# Patient Record
Sex: Female | Born: 1940
Health system: Southern US, Community
[De-identification: ages and names within clinical notes are randomized; demographics above are authoritative.]

## PROBLEM LIST (undated history)

## (undated) DIAGNOSIS — R0600 Dyspnea, unspecified: Secondary | ICD-10-CM

## (undated) DIAGNOSIS — Z5189 Encounter for other specified aftercare: Secondary | ICD-10-CM

## (undated) DIAGNOSIS — R519 Headache, unspecified: Secondary | ICD-10-CM

## (undated) DIAGNOSIS — I8290 Acute embolism and thrombosis of unspecified vein: Secondary | ICD-10-CM

## (undated) DIAGNOSIS — J449 Chronic obstructive pulmonary disease, unspecified: Secondary | ICD-10-CM

## (undated) DIAGNOSIS — I219 Acute myocardial infarction, unspecified: Secondary | ICD-10-CM

## (undated) DIAGNOSIS — M549 Dorsalgia, unspecified: Secondary | ICD-10-CM

## (undated) DIAGNOSIS — Z9889 Other specified postprocedural states: Secondary | ICD-10-CM

## (undated) DIAGNOSIS — I10 Essential (primary) hypertension: Secondary | ICD-10-CM

## (undated) DIAGNOSIS — T7840XA Allergy, unspecified, initial encounter: Secondary | ICD-10-CM

## (undated) DIAGNOSIS — I2510 Atherosclerotic heart disease of native coronary artery without angina pectoris: Secondary | ICD-10-CM

## (undated) DIAGNOSIS — M1990 Unspecified osteoarthritis, unspecified site: Secondary | ICD-10-CM

## (undated) DIAGNOSIS — Z9289 Personal history of other medical treatment: Secondary | ICD-10-CM

## (undated) DIAGNOSIS — M545 Other chronic pain: Secondary | ICD-10-CM

## (undated) DIAGNOSIS — R112 Nausea with vomiting, unspecified: Secondary | ICD-10-CM

## (undated) DIAGNOSIS — Z951 Presence of aortocoronary bypass graft: Secondary | ICD-10-CM

## (undated) DIAGNOSIS — M359 Systemic involvement of connective tissue, unspecified: Secondary | ICD-10-CM

## (undated) DIAGNOSIS — J189 Pneumonia, unspecified organism: Secondary | ICD-10-CM

## (undated) DIAGNOSIS — R7611 Nonspecific reaction to tuberculin skin test without active tuberculosis: Secondary | ICD-10-CM

## (undated) DIAGNOSIS — M797 Fibromyalgia: Secondary | ICD-10-CM

## (undated) DIAGNOSIS — D649 Anemia, unspecified: Secondary | ICD-10-CM

## (undated) DIAGNOSIS — G8929 Other chronic pain: Secondary | ICD-10-CM

## (undated) DIAGNOSIS — N189 Chronic kidney disease, unspecified: Secondary | ICD-10-CM

## (undated) DIAGNOSIS — R51 Headache: Secondary | ICD-10-CM

## (undated) DIAGNOSIS — R9431 Abnormal electrocardiogram [ECG] [EKG]: Secondary | ICD-10-CM

## (undated) DIAGNOSIS — G709 Myoneural disorder, unspecified: Secondary | ICD-10-CM

## (undated) DIAGNOSIS — R0989 Other specified symptoms and signs involving the circulatory and respiratory systems: Secondary | ICD-10-CM

## (undated) DIAGNOSIS — E785 Hyperlipidemia, unspecified: Secondary | ICD-10-CM

## (undated) DIAGNOSIS — T8859XA Other complications of anesthesia, initial encounter: Secondary | ICD-10-CM

## (undated) DIAGNOSIS — I200 Unstable angina: Secondary | ICD-10-CM

## (undated) DIAGNOSIS — K219 Gastro-esophageal reflux disease without esophagitis: Secondary | ICD-10-CM

## (undated) DIAGNOSIS — C801 Malignant (primary) neoplasm, unspecified: Secondary | ICD-10-CM

## (undated) DIAGNOSIS — T4145XA Adverse effect of unspecified anesthetic, initial encounter: Secondary | ICD-10-CM

## (undated) DIAGNOSIS — Z86010 Personal history of colonic polyps: Secondary | ICD-10-CM

## (undated) HISTORY — PX: CERVICAL FUSION: SHX112

## (undated) HISTORY — DX: Acute myocardial infarction, unspecified: I21.9

## (undated) HISTORY — DX: Allergy, unspecified, initial encounter: T78.40XA

## (undated) HISTORY — DX: Other chronic pain: G89.29

## (undated) HISTORY — DX: Other specified symptoms and signs involving the circulatory and respiratory systems: R09.89

## (undated) HISTORY — DX: Other chronic pain: M54.50

## (undated) HISTORY — DX: Personal history of colonic polyps: Z86.010

## (undated) HISTORY — PX: TUBAL LIGATION: SHX77

## (undated) HISTORY — DX: Unspecified osteoarthritis, unspecified site: M19.90

## (undated) HISTORY — DX: Essential (primary) hypertension: I10

## (undated) HISTORY — DX: Personal history of other medical treatment: Z92.89

## (undated) HISTORY — DX: Atherosclerotic heart disease of native coronary artery without angina pectoris: I25.10

## (undated) HISTORY — DX: Presence of aortocoronary bypass graft: Z95.1

## (undated) HISTORY — DX: Systemic involvement of connective tissue, unspecified: M35.9

## (undated) HISTORY — DX: Encounter for other specified aftercare: Z51.89

## (undated) HISTORY — DX: Nonspecific reaction to tuberculin skin test without active tuberculosis: R76.11

## (undated) HISTORY — PX: CARPAL TUNNEL RELEASE: SHX101

## (undated) HISTORY — DX: Fibromyalgia: M79.7

## (undated) HISTORY — PX: TROCHANTERIC BURSA EXCISION: SHX2581

## (undated) HISTORY — DX: Hyperlipidemia, unspecified: E78.5

## (undated) HISTORY — DX: Low back pain: M54.5

---
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
## 1979-01-03 HISTORY — PX: ABDOMINAL HYSTERECTOMY: SHX81

## 1983-01-03 HISTORY — PX: EYE SURGERY: SHX253

## 1983-01-03 HISTORY — PX: BLADDER REPAIR: SHX76

## 1990-01-02 HISTORY — PX: BREAST SURGERY: SHX581

## 1997-08-11 ENCOUNTER — Other Ambulatory Visit: Admission: RE | Admit: 1997-08-11 | Discharge: 1997-08-11 | Payer: Self-pay | Admitting: Obstetrics and Gynecology

## 1997-11-19 ENCOUNTER — Ambulatory Visit (HOSPITAL_COMMUNITY): Admission: RE | Admit: 1997-11-19 | Discharge: 1997-11-19 | Payer: Self-pay | Admitting: *Deleted

## 1998-09-23 ENCOUNTER — Other Ambulatory Visit: Admission: RE | Admit: 1998-09-23 | Discharge: 1998-09-23 | Payer: Self-pay | Admitting: *Deleted

## 1998-11-11 ENCOUNTER — Encounter: Payer: Self-pay | Admitting: Neurosurgery

## 1998-11-11 ENCOUNTER — Ambulatory Visit (HOSPITAL_COMMUNITY): Admission: RE | Admit: 1998-11-11 | Discharge: 1998-11-11 | Payer: Self-pay | Admitting: Neurosurgery

## 1998-12-06 ENCOUNTER — Ambulatory Visit (HOSPITAL_COMMUNITY): Admission: RE | Admit: 1998-12-06 | Discharge: 1998-12-06 | Payer: Self-pay | Admitting: Neurosurgery

## 1998-12-06 ENCOUNTER — Encounter: Payer: Self-pay | Admitting: Neurosurgery

## 1999-04-11 ENCOUNTER — Encounter: Payer: Self-pay | Admitting: Family Medicine

## 1999-04-11 ENCOUNTER — Encounter: Admission: RE | Admit: 1999-04-11 | Discharge: 1999-04-11 | Payer: Self-pay | Admitting: Family Medicine

## 1999-10-31 ENCOUNTER — Other Ambulatory Visit: Admission: RE | Admit: 1999-10-31 | Discharge: 1999-10-31 | Payer: Self-pay | Admitting: *Deleted

## 2000-01-03 HISTORY — PX: PUBOVAGINAL SLING: SHX1035

## 2000-01-30 ENCOUNTER — Ambulatory Visit (HOSPITAL_COMMUNITY): Admission: RE | Admit: 2000-01-30 | Discharge: 2000-01-30 | Payer: Self-pay | Admitting: *Deleted

## 2000-12-20 ENCOUNTER — Other Ambulatory Visit: Admission: RE | Admit: 2000-12-20 | Discharge: 2000-12-20 | Payer: Self-pay | Admitting: Obstetrics and Gynecology

## 2001-05-23 ENCOUNTER — Encounter: Admission: RE | Admit: 2001-05-23 | Discharge: 2001-05-23 | Payer: Self-pay | Admitting: Family Medicine

## 2001-05-23 ENCOUNTER — Encounter: Payer: Self-pay | Admitting: Family Medicine

## 2001-07-25 ENCOUNTER — Encounter: Payer: Self-pay | Admitting: Neurosurgery

## 2001-07-25 ENCOUNTER — Ambulatory Visit (HOSPITAL_COMMUNITY): Admission: RE | Admit: 2001-07-25 | Discharge: 2001-07-25 | Payer: Self-pay | Admitting: Neurosurgery

## 2002-01-02 DIAGNOSIS — I8290 Acute embolism and thrombosis of unspecified vein: Secondary | ICD-10-CM

## 2002-01-02 DIAGNOSIS — Z5189 Encounter for other specified aftercare: Secondary | ICD-10-CM

## 2002-01-02 HISTORY — DX: Acute embolism and thrombosis of unspecified vein: I82.90

## 2002-01-02 HISTORY — DX: Encounter for other specified aftercare: Z51.89

## 2002-08-03 ENCOUNTER — Emergency Department (HOSPITAL_COMMUNITY): Admission: EM | Admit: 2002-08-03 | Discharge: 2002-08-03 | Payer: Self-pay | Admitting: Emergency Medicine

## 2002-08-05 ENCOUNTER — Emergency Department (HOSPITAL_COMMUNITY): Admission: AD | Admit: 2002-08-05 | Discharge: 2002-08-05 | Payer: Self-pay | Admitting: *Deleted

## 2002-08-10 ENCOUNTER — Encounter: Payer: Self-pay | Admitting: *Deleted

## 2002-08-10 ENCOUNTER — Inpatient Hospital Stay (HOSPITAL_COMMUNITY): Admission: EM | Admit: 2002-08-10 | Discharge: 2002-08-20 | Payer: Self-pay | Admitting: *Deleted

## 2002-08-11 ENCOUNTER — Encounter: Payer: Self-pay | Admitting: *Deleted

## 2002-08-12 ENCOUNTER — Encounter: Payer: Self-pay | Admitting: *Deleted

## 2002-08-13 ENCOUNTER — Encounter: Payer: Self-pay | Admitting: *Deleted

## 2002-08-14 ENCOUNTER — Encounter: Payer: Self-pay | Admitting: *Deleted

## 2002-08-15 ENCOUNTER — Encounter: Payer: Self-pay | Admitting: Cardiology

## 2002-08-15 ENCOUNTER — Encounter: Payer: Self-pay | Admitting: *Deleted

## 2002-08-16 ENCOUNTER — Encounter: Payer: Self-pay | Admitting: *Deleted

## 2002-08-19 ENCOUNTER — Encounter: Payer: Self-pay | Admitting: *Deleted

## 2002-08-20 ENCOUNTER — Inpatient Hospital Stay (HOSPITAL_COMMUNITY)
Admission: RE | Admit: 2002-08-20 | Discharge: 2002-09-02 | Payer: Self-pay | Admitting: Physical Medicine & Rehabilitation

## 2002-08-21 ENCOUNTER — Encounter: Payer: Self-pay | Admitting: Physical Medicine & Rehabilitation

## 2003-04-20 ENCOUNTER — Ambulatory Visit (HOSPITAL_COMMUNITY): Admission: RE | Admit: 2003-04-20 | Discharge: 2003-04-20 | Payer: Self-pay | Admitting: *Deleted

## 2003-04-20 LAB — HM COLONOSCOPY: HM Colonoscopy: NORMAL

## 2004-09-16 ENCOUNTER — Encounter: Admission: RE | Admit: 2004-09-16 | Discharge: 2004-09-16 | Payer: Self-pay | Admitting: Family Medicine

## 2004-10-04 ENCOUNTER — Encounter: Admission: RE | Admit: 2004-10-04 | Discharge: 2004-10-17 | Payer: Self-pay | Admitting: Internal Medicine

## 2005-01-23 ENCOUNTER — Ambulatory Visit (HOSPITAL_COMMUNITY): Admission: RE | Admit: 2005-01-23 | Discharge: 2005-01-23 | Payer: Self-pay | Admitting: Anesthesiology

## 2005-07-27 ENCOUNTER — Ambulatory Visit: Payer: Self-pay | Admitting: Family Medicine

## 2005-11-17 ENCOUNTER — Encounter: Admission: RE | Admit: 2005-11-17 | Discharge: 2005-11-17 | Payer: Self-pay | Admitting: Family Medicine

## 2006-03-15 ENCOUNTER — Ambulatory Visit: Payer: Self-pay | Admitting: Family Medicine

## 2006-11-25 ENCOUNTER — Emergency Department (HOSPITAL_COMMUNITY): Admission: EM | Admit: 2006-11-25 | Discharge: 2006-11-25 | Payer: Self-pay | Admitting: Emergency Medicine

## 2007-06-07 ENCOUNTER — Emergency Department (HOSPITAL_COMMUNITY): Admission: EM | Admit: 2007-06-07 | Discharge: 2007-06-07 | Payer: Self-pay | Admitting: Emergency Medicine

## 2007-10-16 ENCOUNTER — Ambulatory Visit (HOSPITAL_COMMUNITY): Admission: RE | Admit: 2007-10-16 | Discharge: 2007-10-16 | Payer: Self-pay | Admitting: Anesthesiology

## 2008-02-11 ENCOUNTER — Ambulatory Visit: Payer: Self-pay | Admitting: Family Medicine

## 2008-02-25 ENCOUNTER — Ambulatory Visit: Payer: Self-pay | Admitting: Family Medicine

## 2008-02-25 ENCOUNTER — Encounter: Admission: RE | Admit: 2008-02-25 | Discharge: 2008-02-25 | Payer: Self-pay | Admitting: Family Medicine

## 2008-03-11 ENCOUNTER — Ambulatory Visit: Payer: Self-pay | Admitting: Family Medicine

## 2009-07-19 ENCOUNTER — Encounter: Admission: RE | Admit: 2009-07-19 | Discharge: 2009-07-19 | Payer: Self-pay | Admitting: Family Medicine

## 2009-07-19 ENCOUNTER — Ambulatory Visit: Payer: Self-pay | Admitting: Family Medicine

## 2009-09-07 ENCOUNTER — Ambulatory Visit: Payer: Self-pay | Admitting: Family Medicine

## 2009-09-21 ENCOUNTER — Ambulatory Visit: Payer: Self-pay | Admitting: Family Medicine

## 2009-09-28 ENCOUNTER — Ambulatory Visit: Payer: Self-pay | Admitting: Family Medicine

## 2010-01-31 LAB — HM MAMMOGRAPHY

## 2010-05-20 NOTE — Discharge Summary (Signed)
[name] [name], [name] [name]                            ACCOUNT NO.:  [id number]   MEDICAL RECORD NO.:  [id number]                   PATIENT TYPE:  IPS   LOCATION:  4009                                 FACILITY:  [facility]   PHYSICIAN:  Ellwood Dense, M.D.                DATE OF BIRTH:  [birth year]   DATE OF ADMISSION:  08/20/2002  DATE OF DISCHARGE:  09/02/2002                                 DISCHARGE SUMMARY   DISCHARGE DIAGNOSES:  1. Fibromyalgia with acute onset of left extremity pain with immobility and     deconditioning.  2. Left ankle deep vein thrombosis.  3. Pneumonia.  4. Connective tissue disorder.  5. Urinary retention.  6. Anemia of chronic disease.  7. Depression.   HISTORY OF PRESENT ILLNESS:  [name] [name] is a 70 year old female with history  of chronic neck and back pain, fibromyalgia, recent exacerbation of symptoms  admitted to [facility] hospital August 8 with acute confusion,  hallucinations, urinary retention, acute renal failure, right upper lobe  pneumonia. Nephrology was consulted, and patient treated with sodium bicarb  and IV fluids with improvement in her renal status. She was started on IV  antibiotics for pneumonia. She developed ARSD requiring intubation August 8  to August 11. Dr. Aldean Ast was consulted for bladder outlet obstruction,  and Foley was placed and to remain in place until mobility improves. The  patient did develop complaints of left thigh pain, and Dopplers were done  showing positive for thrombus in left posterior tibial vein above the ankle.  MRI of thoracic back done showed no cord lesions, fracture or mass; a  hemangioma at T9 vertebral body on right. AP of pelvis and left femur done  showed no acute abnormalities, subtle lucency over medullary cavity of  proximal femur with question need for nucleus scintigraphy versus MRA. Dr.  Thad Ranger was consulted as patient with question of thoracic myelopathy. A  followup exam by Dr. Orlin Hilding showed no  signs of myelopathy; however, the  patient noted to be sedated secondary to multiple medications. Some ST  segment changes were noted on her EKG with elevated troponin. [place]  cardiology was consulted for abnormalities secondary to sepsis and acute  renal insufficiency. Followup echocardiogram was done on August 18 showing  EF of 55 to 65%. No valvular disease. The patient to have Cardiolite on  outpatient basis. The patient did have a drop in H & H requiring 2 units of  packed red blood cells once Lovenox was initiated. GI was consulted and felt  the problem was due to NSAIDs and blood thinners. RBC indices showed patient  with anemia of chronic disease. He recommended continuing PPI and following  patient along for signs of active bleed. Physical therapy initiated, and  patient at moderate assist for transfers, minimum to maximum assist to stand  for ADLs.   PAST MEDICAL HISTORY:  1. Significant for  chronic neck and back pain with cervical fusion.  2. Fibromyalgia.  3. Depression.  4. Asthma.  5. Incontinence with bladder surgery x2.  6. Appendectomy.  7. GERD.  8. Chronic diarrhea.  9. Allergic rhinitis.  10.      Hypertension.  11.      Carpal tunnel surgery.   ALLERGIES:  No known drug allergies.   SOCIAL HISTORY:  The patient is married and lives in two level home with  four steps at entry. Was independent but bed bound one week prior to  admission secondary to pain. She does not use any tobacco or alcohol.   HOSPITAL COURSE:  [name] [name] was admitted to rehab on August 20, 2002  for inpatient therapies to consist of PT and OT daily. The past admission,  the patient was maintained on Lovenox for 14 days total to complete  antibiotic therapy. She was noted to have significant pain in her left thigh  with difficulty with examining left thigh secondary to pain. Followup MRI  was done of proximal thigh as well as MRI of lumbar spine to rule out any  abnormality. MRI of  pelvis and femur showed unremarkable bony structures of  pelvis, mild edema like changes,  external rotation of left hip suggesting  muscle strain or myositis, and mild edema like changes in obturator  musculature. No joint effusion, no peri-articular fluid, no evidence of  trochanteric bursitis. MRI of lumbar spine showed small foraminal protrusion  L3-L4, left, without L3-L4 nerve root encroachment, small central perfusion  L5-S1, and lower lumbar facet arthropathy. The patient was maintained on  Coumadin throughout her stay.   Labs done past admission showed H and H of 11.4 and 34.0. Stool guaiac x1  done was negative. Check of electrolytes showed sodium 139, potassium 3.8,  chloride 109, CO2 25, BUN 17, creatinine 1.2, glucose 143. AST 43, ALT 43,  alkaline phosphatase 68, total bilirubin 0.3. The patient was maintained on  fentanyl patch. This was titrated to 75 mcg initially. Baclofen was added on  a t.i.d. basis as well as Xanax and Bextra for pain control. The patient was  also using p.r.n. OxyIR for pain management. The patient achieved good pain  control on multiple medications; however, she was noted to have some  problems with nausea as well as lethargy. The patient's medicines were  tapered to an extent at time of discharge. She was taken off of baclofen  with improvement in her sedation. She was also tapered to 50 mcg of fentanyl  patch with OxyIR 5 to 10 to be used q.i.d. Bextra to increase to 20 past  discharge. The patient's pain is under good control at time of discharge.  She is to followup with Dr. Thyra Breed for any further adjustment of her  chronic pain issues. As pain control improved, she was able to make good  progress. At time of discharge, the patient had progressed to being modified  independent for ADLs, modified independent for toileting, as well as  advanced ADLs at wheelchair level. The patient is independent for transfers. She is modified independent  for ambulating with a rolling walker in a  restricted environment, requires supervision for ambulating on the unit for  greater than 100 feet. Further progressive therapies to include home health  PT/OT by [facility] past discharge. Home health RN has been arranged  to draw pro times, next on September with results to Dr. Susann Givens. On September 02, 2002, the patient is  discharged to home.   DISCHARGE MEDICATIONS:  1. Plaquenil 200 mg two p.o. per day.  2. Zoloft 25 mg a day.  3. Neurontin 600 mg t.i.d.  4. Prevacid 30 mg a day.  5. Bextra 20 mg a day.  6. Cardura 4 mg half to one q.h.s.  7. Fentanyl patch 50 mcg change every three days.  8. Coumadin 2 mg _____ p.o. q.p.m.  9. OxyIR 5 to 10 mg q.4-6h. p.r.n. pain.  10.      Skelaxin 400 mg q.i.d. p.r.n. spasm.  11.      May use Tylenol additionally for pain.   ACTIVITY:  Use walker and wheelchair.   SPECIAL INSTRUCTIONS:  No alcohol, no smoking, no driving. Catheterize three  to four times a day to keep residuals less than 500 cc.   FOLLOWUP:  The patient is to follow up with Dr. Susann Givens and Dr. Thyra Breed in the next two to three weeks. Follow up with Dr. Aldean Ast in two  to three weeks for urology.   ADDENDUM:  Once patient's mobility improved, Foley was discontinued. The  patient was noted to have problems voiding once Foley was discontinued. Dr.  Aldean Ast initiated Cardura and increased this to 4 mg. At time of  discharge, the patient still reports no voiding and requires in and out  catheter. She is to follow up with Dr. Aldean Ast on an outpatient basis for  further management of urinary retention.      Greg Cutter, P.A.                    Ellwood Dense, M.D.    PP/MEDQ  D:  09/02/2002  T:  09/03/2002  Job:  [id number]   cc:   Olga Millers, M.D.   Michael L. Thad Ranger, M.D.  [address]  [place]  [place] [address]  Fax: [phone]   Rozanna Boer., M.D.  [address]  Fl[address]  [place]  [place] [address]  Fax: [phone]   Petra Kuba, M.D.  [address]  [place]  [place] [address]  Fax: [phone]   Shan Levans, M.D. [facility]   Aram Beecham B. Eliott Nine, M.D.  [address]  [place]  [place] [address]  Fax: [phone]   Willette Pa, M.D.  [facility], Director of rheumatology, allergy,  [facility] [address]  [place], [place] [address]   Kathrin Penner. Vear Clock, M.D.  [address]  [place]  [place] [address]  Fax: [phone]   Sharlot Gowda, M.D.  [address] [place], [place] [address]  Fax: [phone]

## 2010-05-20 NOTE — Consult Note (Signed)
[name] [name], [name] [name]                            ACCOUNT NO.:  [id number]   MEDICAL RECORD NO.:  [id number]                   PATIENT TYPE:  INP   LOCATION:  6741                                 FACILITY:  [facility]   PHYSICIAN:  Petra Kuba, M.D.                 DATE OF BIRTH:  1940-09-09   DATE OF CONSULTATION:  08/18/2002  DATE OF DISCHARGE:                                   CONSULTATION   HISTORY:  The patient with a complicated hospital course who has previously  been seen by Dr. Luther Parody with nondiagnostic colon and endo in January of  2002 who prior to getting sick at home has had minimal GI symptoms, took  Prevacid every other day without problems. She has not seen any blood in her  bowels and had no abdominal complaints. She had lost some weight but has  been on a diet trying to loose it herself. During stay in the hospital, she  has had some vague abdominal pain. She has not seen any obvious blood, has  been moving her bowels normally. She believes the pain are due to her  injections of Lovenox. She has had some decreased appetite and some mild  nausea. We were consulted for further workup and plans.   PAST MEDICAL HISTORY:  Resolved ARDS and renal failure as well as  thrombocytopenia. She has had fibromyalgia, chronic neck and back pain, as  well as mild asthma, history of depression, history of bladder surgery,  tubal ligation, appendectomy, cervical fusion.   MEDICATIONS ON ADMISSION:  Bextra, prednisone, Duragesic, Percocet,  albuterol, and Phenergan. Currently in the hospital now, she is on Protonix,  prednisone, Plaquenil, Neurontin, Zoloft, Avelox, Ativan, Klonopin,  Ventolin, Coumadin, Lovenox, baclofen, Flexeril.   SOCIAL HISTORY:  Quit tobacco 25 years ago. No alcohol. Did use some  nonsteroidals at home.   ALLERGIES:  No known drug allergies.   FAMILY HISTORY:  Pertinent for the chart says she is adopted.   REVIEW OF SYSTEMS:  Negative except for as  above.   PHYSICAL EXAMINATION:  GENERAL:  The patient looks much better than her  situation sounds.  VITAL SIGNS:  Stable, afebrile.  ABDOMEN:  Her exam pertinent for abdomen being slightly sore but good bowel  sounds, soft. She was guaiac positive in the computer but greenish stool.   Labs pertinent for a slight decrease in her hemoglobin to 8.9; had been 9  for a few days; MCV 86. BUN and creatinine now normal 9 and 1.0; had been  initially 135 and a creatinine of 9.0. Liver tests were normal, albumin 2.3.  Initially, her first two guaiacs were negative in the hospital, the last two  had been positive.   ASSESSMENT:  1. Multiple medical problems, probably slowly improving.  2. Guaiac positive on blood thinners.  3. Low platelet count, resolved.  4. Acute renal failure, resolved, and  at home nonsteroidals and COX's. She     was guaiac negative x2 initially on admission.  5. Anemia with the indices that looked more like chronic disease, and she     did have her decrease in her hemoglobin with increase in IV fluids and     resolving renal failure.   PLAN:  Will allow patient to improve. Will proceed with endoscopic  procedures p.r.n. signs of active bleeding or if signs of slow oozing  continue. Agree with pump inhibitors for now. Dr. Luther Parody who knows the  patient and has seen in the past will see her either Tuesday or Wednesday  and call sooner if any questions, problems, or signs of active bleeding.                                               Petra Kuba, M.D.    MEM/MEDQ  D:  08/18/2002  T:  08/19/2002  Job:  [id number]   cc:   Althea Grimmer. Luther Parody, M.D.  [address]  [place]  [place] [address]  Fax: [phone]

## 2010-05-20 NOTE — Consult Note (Signed)
[name], [name]                            ACCOUNT NO.:  [id number]   MEDICAL RECORD NO.:  [id number]                   PATIENT TYPE:  INP   LOCATION:  2114                                 FACILITY:  [facility]   PHYSICIAN:  Maree Krabbe, M.D.             DATE OF BIRTH:  05-20-1940   DATE OF CONSULTATION:  08/10/2002  DATE OF DISCHARGE:                                   CONSULTATION   REASON FOR CONSULTATION:  Elevated creatinine.   HISTORY OF PRESENT ILLNESS:  The patient is a 70 year old white female with  a history of chronic neck and back pain, mild asthma, fibromyalgia, and a  history of depression.  She also has a history of urinary incontinence with  two bladder surgeries in the past.  She had an episode of acute back pain  about one week ago.  Her Bextra dose was increased to 60 mg a day and she  took more pain medications.  Subsequently she developed decreased urine  output about four days ago and now presents with acute renal failure, severe  azotemia, right upper lobe pneumonia, and 1500 mL in her bladder on initial  catheterization.  She was also recently placed on a steroid taper for her  back pain, as well as Percocet and Phenergan.  She was also on a Duragesic  patch.  She was seen at [facility] here on Tuesday and given pain treatment.  Because of decreased urine output, she had an I&O catheterization with 1400  mL of urine in her bladder.  She developed confusion last night at 9 p.m.  She was brought to the emergency room today by her husband, who provides  most of the history.   PAST MEDICAL HISTORY:  1. Asthma.  2. Chronic neck and back pain with cervical symptoms.  3. Fibromyalgia.   PAST SURGICAL HISTORY:  1. Tubal ligation.  2. Bladder suspension; she had a second surgery for incontinence to put a     kink in it.  I am not sure what this means.  3. Appendectomy.  4. Cervical fusion.   MEDICATIONS:  1. Bextra 60 mg daily.  2. Prednisone taper.  3. Amibid.  4. Duragesic patch.  5. Percocet.  6. Albuterol.  7. Promethazine p.r.n.   SOCIAL HISTORY:  Quit tobacco 25 years ago.  Lives with her husband.  No  alcohol.  Retired from [employer].   FAMILY HISTORY:  She is adopted.   REVIEW OF SYSTEMS:  GENERAL:  Some fevers.  No chills or night sweats.  EARS, NOSE, AND THROAT:  No hearing loss, visual changes, sore throat, or  difficulty swallowing.  CARDIORESPIRATORY:  Denies chest pain.  She does  have a productive cough.  No significant shortness of breath.  GASTROINTESTINAL:  Positive for nausea and vomiting.  No diarrhea or  abdominal pain.  GENITOURINARY:  Decreased urine output for four days.  MUSCULOSKELETAL:  No joint pain or swelling.  She has chronic pain in her  left leg and back.  NEUROLOGIC:  No history of stroke, TIA, or seizure.  No  focal numbness or weakness.   PHYSICAL EXAMINATION:  VITAL SIGNS:  Blood pressure 112/40, temperature  100.2 degrees, heart rate 110, respirations 28, pulse oximetry 90%.  GENERAL APPEARANCE:  This is an awake, confused, delirious, and mildly  agitated white female.  She is fidgety.  She answers most questions  appropriately.  She is fully oriented.  SKIN:  Without rash.  She is flushed in the face.  HEENT:  PERRL.  EOMI.  The throat is dry.  NECK:  Supple with flat neck veins.  CHEST:  Clear on the left side.  The right side has coarse upper airway  noises.  No rales.  Possibly mild expiratory wheezing.  CARDIAC:  Regular rate and rhythm without lift.  Soft systolic ejection  murmur.  No rub or gallop.  ABDOMEN:  Soft and nontender.  No ascites.  Normal bowel sounds.  EXTREMITIES:  No peripheral edema.  No calf tenderness.  No cyanosis.  NEUROLOGIC: Oriented x 3.   LABORATORY DATA:  Sodium 126, potassium 5, chloride 83, CO2 14, BUN 135,  creatinine 9.0, glucose 83, AST 33, ALT 20.  The pH was 7.2, pCO2 34, pO2  83, and 93% oxygen saturation.  Chest x-ray with dense right upper lobe   pneumonia.   IMPRESSION:  1. Acute renal failure due to functional and possibly mechanical bladder     neck obstruction.  Possible contributing factors include volume     depletion, COX 2 inhibitor effect, and anticholinergic effects of     medications.  2. Severe metabolic acidosis due to acute renal failure and poor respiratory     response.  3. History of asthma.  4. Dense acute right upper lobe pneumonia.  5. Chronic back pain.  6. History of bladder surgeries for incontinence.  7. History of depression.   RECOMMENDATIONS:  1. Foley catheter as you have done.  2. Bicarbonate drip at 150 mL/hr and then decrease rate when the pH is over     7.25.  3. Give 2 amps of sodium bicarbonate now and repeat blood gas.  4. Expect renal function to recover with fluids and Foley catheter in time.  5. Consider urology evaluation.  6. Avoid medications with anticholinergic side effects.  7. No need for dialysis at this time.                                               Maree Krabbe, M.D.    RDS/MEDQ  D:  08/10/2002  T:  08/10/2002  Job:  [id number]

## 2010-05-20 NOTE — Consult Note (Signed)
[name] [name], [name] [name]                            ACCOUNT NO.:  [id number]   MEDICAL RECORD NO.:  [id number]                   PATIENT TYPE:  INP   LOCATION:  6741                                 FACILITY:  [facility]   PHYSICIAN:  Learta Codding, M.D.                 DATE OF BIRTH:  10-29-1940   DATE OF CONSULTATION:  08/14/2002  DATE OF DISCHARGE:                                   CONSULTATION   REPORT TITLE:  CARDIOLOGY CONSULTATION   CONSULTING PHYSICIAN:  Learta Codding, M.D.   REFERRING PHYSICIAN:  Ronnald Nian, M.D.   REASON FOR CONSULTATION:  Mildly elevated cardiac Troponin levels.   HISTORY OF PRESENT ILLNESS:  [name] [name] is a 70 year old female with chronic  neck and lower back pain, fibromyalgia, and history of depression.  The  patient was admitted on August 10, 2002, with bladder outlet obstruction as  well as sepsis and ultimately developing acute lung injury and sepsis  syndrome requiring intubation.  The patient remained critically ill for  several days and was placed on Xigris infusion.  She has now been extubated.  She denies shortness of breath at rest.  She does report severe left leg  pain.  She has severe left leg pain which is very tender to the touch.   A duplex study demonstrated a small thrombus in the posterior tibial vein  just above the ankle.  Otherwise, there was no evidence of deep vein  thrombosis.  Last night, a cardiac Troponin was drawn for unclear reasons as  the patient did not report any substernal chest pain.  The level was mildly  elevated at 0.66.  Next, the Troponin levels of last night was 0.64.  The  patient denied substernal chest pain.  She denies prior history of  myocardial infarction.  She is adopted and does not know much about her  family history.  Prior to this admission, however, she reports no dyspnea on  exertion or chest pain on exertion.   ALLERGIES:  No known drug allergies.   MEDICATIONS:  1. Duragesic patch.  2. Protonix  40 mg daily.  3. Prednisone 10 mg daily.  4. Plaquenil 4 mg daily.  5. Neurontin 600 mg p.o. at bedtime.  6. Zoloft 25 mg daily.  7. Lovenox 80 mg subcu q.12 h.   PAST MEDICAL HISTORY:  1. History of back pain and neck pain, treated with Bextra, Prednisone,     Percocet, Phenergan, Duragesic.  2. History of fibromyalgia.  3. History of asthma.  4. History of multiple surgeries.  5. History of depression.  6. History of recent bladder outlet obstruction.  7. History of sepsis and multiorgan dysfunction syndrome requiring Xigris     therapy.  8. History of cervical fusion.  9. History of tubal ligation.  10.      History of bladder suspension.   SOCIAL HISTORY:  The patient is retired from [employer].  Lives with her husband  in [place].  No alcohol use.  Quit tobacco 25 years ago.   FAMILY HISTORY:  The patient is adopted.   REVIEW OF SYSTEMS:  As per HPI.  No current fever or chills.  No headache or  sore throat.  No chest pain or shortness of breath.  Currently no frequency  or dysuria.  Positive for weakness, myalgias and nephralgias.  No nausea or  vomiting.  No polyuria or polydipsia.   PHYSICAL EXAMINATION:  VITAL SIGNS:  Blood pressure 144/80, heart rate 70,  temperature afebrile.  GENERAL:  Well-nourished white female somewhat pale appearing.  HEENT:  Pupils equal, round and reactive to light.  NECK:  Supple.  No carotid bruits.  LUNGS:  Bilateral crackles throughout.  No rhonchi, no wheezing.  HEART:  Regular rate and rhythm.  Soft murmur in the left upper sternal  border, but no S3 or S4.  SKIN:  Warm and dry.  Tender to touch in the left thigh.  ABDOMEN:  Soft, nontender, no rebound or guarding.  Good bowel sounds.  GENITALIA/RECTAL:  Deferred.  EXTREMITIES:  No cyanosis, clubbing or edema.  NEUROLOGICAL:  The patient is alert, oriented and grossly nonfocal.   LABORATORY DATA:  Chest x-ray on August 12, 2002, right upper lobe  pneumonia.  Bilateral lower lobe  opacities consistent with pneumonia.   EKG:  Normal sinus rhythm.  Heart 73 bpm.  Nonspecific ST-T wave changes.  No prior infarction pattern.   Serum creatinine was 9.09, now 1.8, BUN 51, potassium 8.2, sodium 147,  glucose 143.  Hemoglobin 9.0, hematocrit 26, platelets 124,000.  Troponin  0.64 and 0.66.  D-dimer 5.05.   IMPRESSION/PLAN:  1. Elevated cardiac Troponins.  Elevation of cardiac Troponins is entirely     nonspecific.  The patient is just recovering from sepsis and multiorgan     dysfunction syndrome, a condition which is known to elevate the cardiac     Troponins.  Elevated cardiac Troponins are a mortality marker for this     syndrome.  However, I do not suspect that the cardiac enzymes are     elevated due to underlying ischemic heart disease.  An alternative     hypothesis could be that she briefly developed a septic cardiomyopathy     with elevation and cardiac Troponin markers.  The patient has no prior     history of myocardial infarction and no current chest pain that would     stop drawing cardiac Troponins at this point in time.  The patient should     get an echocardiogram for evaluation of left ventricular function and an     adenosine/Cardiolite in the near future for further risk gratification.     Would anticipate that both studies would be within normal limits.  No     further workup is required beyond that point.  I have no specific     recommendation for cardiac medications at this point in time, but this     recommendation can be changed pending the results of her echocardiogram     and adenosine/Cardiolite study.  Another possibility explaining the     elevated cardiac Troponin could be right heart strain/stress from acute     lung injury.  2. Status post acute renal failure secondary to bladder outlet obstruction     and sepsis. 3. Status post sepsis metabolic acidosis requiring Xigris therapy.  4. Left lower extremity small  deep vein thrombosis which  would explain the     elevated D-dimer, but it is also again nonspecific.  Doubt the patient     has had pulmonary embolism which is again an alternative explanation for     elevation of cardiac Troponin.  5. Chronic pain with narcotic dependency.  6. History of pneumonia/acute respiratory distress syndrome.  7. Fibromyalgia and back pain.  8. Anticoagulation.  Agree with Lovenox at this point in time, but would     carefully monitor anti-DNA levels as the patient's creatinine is still     abnormal.  I suspect her dose is too high for Lovenox at this point in     time.   DISPOSITION:  No further recommendations short of obtaining an  echocardiogram and adenosine/Cardiolite to make sure the patient does not  have any significant underlying heart disease.  As outline above, cardiac  Troponin elevations are nonspecific and could be entirely explained by a  recent hospital course.                                               Learta Codding, M.D.    GED/MEDQ  D:  08/14/2002  T:  08/14/2002  Job:  [id number]   cc:   Sharlot Gowda, M.D.  [address] [place], [place] [address]  Fax: [phone]

## 2010-05-20 NOTE — Consult Note (Signed)
[name] [name], [name] [name]                            ACCOUNT NO.:  [id number]   MEDICAL RECORD NO.:  [id number]                   PATIENT TYPE:  INP   LOCATION:  6741                                 FACILITY:  [facility]   PHYSICIAN:  Melvyn Novas, M.D.               DATE OF BIRTH:  September 15, 1940   DATE OF CONSULTATION:  08/15/2002  DATE OF DISCHARGE:                                   CONSULTATION   NEUROLOGY CONSULTATION:   PRIMARY CARE PHYSICIAN:  Alfonse Spruce, M.D.   REASON FOR CONSULTATION:  The patient is in severe pain and I am consulted  to evaluate her for possible disk disease.  This is a 70 year old Caucasian  right-handed female who was admitted on August 10, 2002 developing acute  renal failure with bladder dysfunction.  She retained 1.5 L in her bladder.  She was well until about August 03, 2002 when she had [place] the [place]  with her grandchildren and pulled them all around in a wagon which hurt her  back.  She had been seen by the pain clinic, Dr. Thyra Breed, and she has  been treated with Duragesic patches for some time.  The pain became  excruciating and after her catheterization and relief of the bladder volume  she was put on a steroid taper on August 06, 2002.  She was also given  Percocet and Phenergan in addition to her Duragesic patch.  On August 10, 2002 she was admitted as her creatinine had increased to 9.  An ultrasound  of the kidney showed no hydronephrosis or stones __________ she has since  then diuresed well and her Foley catheter stayed in place.  Her creatinine  has constantly and rapidly decreased, it was 2 on August 13, 2002 and 1.4 on  August 14, 2002.   PAST MEDICAL HISTORY:  The patient's past medical history is significant for  stress incontinence repair of her vagina which was performed at [facility] in  2000.  She also had a sling procedure done later.  She had neck surgeries,  lower back surgeries, and, according to her husband, she has been  recently  developing multiple infections.  In addition, she has undergone tubal  ligation, appendectomy, and has been diagnosed with fibromyalgia,  depression, and asthma.   MEDICATIONS:  On admission the patient was taking a prednisone taper two at  the 10 mg dose which was the fourth lowest dose on the taper, she is still  on Duragesic patch, Percocet, albuterol, and Phenergan, and also took Bextra  b.i.d. 10 mg.   SOCIAL HISTORY:  The patient does no longer smoke but quit 25 years.  She  denies alcohol or drug use, except for prescription medications.   REVIEW OF SYSTEMS:  Excruciating lower back pain and inability to ambulate,  muscle spasms, lower back spasms, abdominal spasms, inability to void  spontaneously.  PHYSICAL EXAMINATION:  VITAL SIGNS:  Stable vital signs, nonfebrile.  GENERAL:  The patient is somnolent due to the pain medication but does  respond adequately to my questions.  She appears to cooperate with the motor  examination but is severely limited due to her pain.  She cried out multiple  times when I just touched her and even eliciting deep tendon reflexes was  very painful to her.  She complains that her legs both hurt as well as her  abdomen but that her left leg is somewhat more painful.  She was diagnosed  with a DVT below the popliteal level which does not explain why her thigh  would cramp or why the patient is unable to fully extend her legs.  The  patient is here placed still on Duragesic patch, she received Protonix, she  is no longer on prednisone, she takes Plaquenil 4 mg daily, Neurontin at  bedtime, Zoloft 25 mg daily and has been placed on Lovenox subcu.  The chart  mentioned that the patient has chronic narcotic pump to use, but is followed  by Dr. Vear Clock in the pain clinic.  NEUROLOGIC:  Alert and oriented but in pain and sometimes not following  commands secondary to pain inhibition.  Cranial nerves: Pupils are equal,  round, react to  accomodation 4 mm bilaterally at baseline but they constrict  promptly to light.  The patient shows no papilledema and no photic pain.  She has normal extraocular movements and visual fields bilaterally, full  facial strength and sensory, tongue and uvula are midline, the patient has a  normal gag reflex and her neck is painless when passively moved.  Motor exam  again limited due to the pain.  I am concerned that I elicit very brisk deep  tendon reflexes in the lower extremities, upgoing toes bilaterally to  plantar stimulation are seen and the patient seems to have a reflex  myoclonus with increasing tone.  Sensory examination shows that the patient  feels from the navel or umbilical region on downwards left fine touch,  temperature, or pinprick.  This is a belt-like limitation for her sensory  level that would correspond with the T10-T12 level.  She has also anal and  groin numbness.  She reports that she cannot feel when she is touching her  buttocks and that her urethral area is numb.   ASSESSMENT:  1. If the sensory level is believable this would be a thoracic myelopathy     and bladder and bowel involvement could be somewhat attributed to the use     of morphine or anticholinergic medications but the upgoing Babinski     bilaterally makes me more concerned about a thoracic myelopathy with     spasticity due to upper motor neuron involvement.  The patient had     cervical spine surgery which might have also an impact on her upper motor     neuron function but she is not reported to have an abnormality before.  2. The MRI review of the lumbar spine on an outside film is not a sufficient     technical study.   PLAN:  I would need a thoracic spine MRI and we will order it with and  without gadolinium and if possible a sagittal whole spine picture should be  taken.  I would also depending on what we see on the MRI work her further up.  Should we have any evidence of a tumorous lesion  we would involve  neurosurgery and probably a spinal tap to send cytology.  If we see a  demyelinating lesion it would be also a spinal tap to send for oligoclonal  bands and if this should involve an ischemic lesion a vasculitis workup.  I  have put the patient on IV heparin without bolus, a baclofen regimen and  ordered PT, OT and rehab evaluation as well as a consult from her pain  specialist Dr. Thyra Breed.                                               Melvyn Novas, M.D.    CD/MEDQ  D:  08/15/2002  T:  08/16/2002  Job:  [id number]

## 2010-05-20 NOTE — Discharge Summary (Signed)
[name], [name] [name]                            ACCOUNT NO.:  [id number]   MEDICAL RECORD NO.:  [id number]                   PATIENT TYPE:  INP   LOCATION:  6741                                 FACILITY:  [facility]   PHYSICIAN:  Hettie Holstein, D.O.                 DATE OF BIRTH:  April 30, 1940   DATE OF ADMISSION:  08/10/2002  DATE OF DISCHARGE:  08/20/2002                                 DISCHARGE SUMMARY   ADMISSION DIAGNOSES:  1. Confusion.  2. Urinary retention.  3. Respiratory distress.  4. Thrombocytopenia.  5. Acute renal failure.  6. Metabolic acidosis with positive anion gap.   DISCHARGE DIAGNOSES:  1. Respiratory distress.  2. Sepsis syndrome.  3. Acute renal failure secondary to obstructive uropathy.  4. Community acquired pneumonia, right upper lobe.  5. Positive deep venous thrombosis left lower leg in posterior tibial vein.  6. Elevated troponin's.  7. Anemia, requiring blood transfusion.  8. Hemoccult positive stools.  9. Fibromyalgia.  10.      Muscle spasm, suspect piriformis syndrome.  11.      Oral Candidiasis, treated.   CONSULTATIONS:  1. Pulmonary critical care was consulted who followed her in the intensive     care unit.  2. [facility], Dr. Andee Lineman with regard to her mildly elevated     troponin levels.  3. Maree Krabbe, M.D., Nephrology as well was consulted.  4. Petra Kuba, M.D. with Gastroenterology was consulted.  5. Melvyn Novas, M.D. as well as consulted of Neurology.  6. For evaluation of the patient's possible myelopathy and bladder outlet     obstruction Courtney Paris, M.D. of Urology evaluated the patient     as well.   PROCEDURES PERFORMED:  The patient underwent Xigris infusion.  She also had  a central line placed.  MICU course leaving the intensive care unit on  August 13, 2002.   DISPOSITION:  The patient was transferred to a rehabilitation facility.   HISTORY OF PRESENT ILLNESS:  The patient is a 70 year old  female with  history of chronic neck and back pain and asthma who presented to the  emergency department with one day of acute onset of confusion and  hallucinations.  She had been in her usual state of health until Sunday  following one week ago when her husband reported she had acute onset of left  sided low back pain and radiculopathy followed by excruciating pain.  She  follows with a pain doctor, Dr. Vear Clock, on a regular basis for chronic  neck and back pain, right leg pain and history of bulging disc.  Subsequently she presented to [facility] and was given some pain  medications and referred back to her pain doctor who continued steroid taper  pack as well as Percocet and Phenergan and Duragesic patch. Phenergan was  her newest medication as well as the  prednisone taper.  She was then seen at  [facility] on Tuesday and given pain medications and went  home but because of decreased urine output was seen at an urgent care on  Wednesday at which time they found 1400 cc of urine following straight  catheterization.  She was subsequently sent home and subsequently she  developed mental status changes on Saturday night around 9:00 P.M. and  presented to the emergency department the A.M. of admission.  I was notified  of the patient's condition and labs by the emergency department and reviewed  her laboratory work.  She was noted to be in metabolic acidosis and renal  failure and thrombocytopenic without evidence of schistocytes on her smear.  At this time, after evaluating her in the emergency department,  noting her  tenuous status, pulmonary critical care was asked to follow her in the  intensive care unit as her respirations were labored.  She appeared to be  heading towards impending respiratory failure.   HOSPITAL COURSE:  The patient was transferred to the intensive care unit.  At that time she was monitored closely.  She was noted to be quite acidotic   and in renal failure.  Critical Care Team was involved immediately.  She was  started on empiric coverage antibiotics for finding of right upper lobe  pneumonia on her portable chest x-ray.  As noted, peripheral smears were  reviewed without evidence of schistocytes.  Ultrasound was ordered to  evaluate her bladder and kidneys.  Blood cultures were obtained as well as  lactic acid levels and ABG's.  She was started on supportive therapy  including oxygen and was started on aggressive nebulizer treatments.  She  was given bicarbonate and DIC panel was performed suggesting DIC picture and  at that time Xigris was initiated.  Her course remained stable.  The patient  did slowly improve.  She continued to have considerable pain with regard to  her leg.  Her renal functions slowly improved with Foley catheterization.  Urology evaluated her.  Neurology was asked to see the patient with regard  to her persistent back pain and possible myelopathy.  Imaging from  [facility] was reviewed with our radiologists and no evidence of  cord compression was noted.  She slowly improved with regard to her renal  function.  Cardiology evaluated her and felt that her cardiac enzymes were  not significant and could be evaluated as an outpatient via noninvasive  stratification.  She was transfused as well she was evaluated by Dr. Ewing Schlein  for Hemoccult positive stools and anemia.  His recommendations were  institution of proton pump inhibitors and follow up with the patient on a  PRN basis.  Her hemoglobin stabilized.  Urology followed her along and  recommended continuation of the Foley catheter throughout hospitalization  and follow up in their office to address this outlet obstruction.  Cardiology's recommendations were stress Cardiolite as an outpatient.  Her  hemoglobin and hematocrit remained stable after transfusion and recommendation for gastroenterology follow up with Dr. Luther Parody was   emphasized.   DISCHARGE MEDICATIONS:  1. Nu-Iron.  2. Ensure t.i.d.  3. Fentanyl patch 50 mcg q.72h.  4. Avalox 400 mg p.o. daily.  5. Coumadin 5 mg p.o. q.h.s.  6. Albuterol unit dose nebs q.6h.  7. Baclofen 20 mg p.o. t.i.d.  8. Klonopin 2 mg p.o. q.h.s.  9. Protonix 40 mg p.o. daily.  10.      Plaquenil 400 mg p.o.  daily.  11.      Neurontin 600 mg p.o. q.8h.  12.      Zoloft 25 mg p.o. daily.    LABORATORY DATA:  Laboratory studies on day of discharge included hemoglobin  of 8.6, white blood cell count 8.5.  INR was 2.6.                                                Hettie Holstein, D.O.    ESS/MEDQ  D:  11/03/2002  T:  11/04/2002  Job:  [id number]

## 2010-05-20 NOTE — Consult Note (Signed)
NAME:  [name] [name], [name] [name] NO.:  [id number]   MEDICAL RECORD NO.:  [id number]                   PATIENT TYPE:  INP   LOCATION:                                       FACILITY:  [facility]   PHYSICIAN:  Rozanna Boer., M.D.      DATE OF BIRTH:  [birth year]   DATE OF CONSULTATION:  08/13/2002  DATE OF DISCHARGE:                                   CONSULTATION   REASON FOR CONSULTATION:  Bladder outlet obstruction with renal  insufficiency.   BRIEF HISTORY:  This 70 year old female was admitted three days ago in acute  renal failure and with 1500 mL in her bladder.  She was well until about 10  days ago when she went [place] the zoo with [place], pulled them all around  with a wagon, and hurt her back.  She has chronic back pain and is seen by  Dr. Vear Clock in the pain clinic and has been on a Duragesic patch for some  time.  She was seen in the emergency room on August 06, 2002, treated for  pain and had apparently 1500 mL in her bladder on catheterization and then a  steroid taper for her back pain.  She was also given Percocet and Phenergan  in addition to her Duragesic patch.  Because of decreased urine output, she  had in and out catheterization with 1400 mL of her bladder and her  creatinine turned out to be 9 on admission on August 10, 2002.  With the  catheter she diuresed.  She did have an ultrasound done on August 11, 2002  with the catheter in place and this showed no hydronephrosis and no stones.  Her creatinine has consistently and rapidly come down.  It was 2.0 today,  August 11, and is expected to be normal by tomorrow; her outputs are quite  good.  The renal service saw her initially and have signed off on her.   PAST MEDICAL HISTORY:  The patient's past history is significant in that at  [facility] about 3-4 years ago she had what sounds like an anterior and posterior  repair of her vagina for stress urinary incontinence, but this failed to  correct her stress urinary incontinence.  She then later had by Dr. Ricki Miller  a sling procedure done as an outpatient about two years ago.  This seemed to  correct her stress urinary incontinence.  She was voiding well up until  about two weeks ago when she hurt her back as described above.  She would go  2-3 hours between voidings, maybe get up once a night; has not been bothered  with urinary tract infections.  A lot of this is provided by her husband who  is with her as the patient is in a lot of pain with her back at this time  and is on a lot of pain medicine to help control this.  The patient's past history is significant for other surgeries which include  a tubal ligation, appendectomy and a cervical fusion.  She also has  fibromyalgia, chronic neck and back pain, mild asthma and a history of  depression.   MEDICATIONS:  On admission the patient was taking Bextra, a predinsone  taper, Duragesic patch, Percocet, albuterol, and Promethazine p.r.n.   SOCIAL HISTORY:  The patient quit tobacco smoking about 25 years ago.  Lives  with her husband.  No alcohol.  Retired from [employer].   REVIEW OF SYSTEMS:  As noted in chart.  Really no other pertinent GU history  other than above.   PHYSICAL EXAMINATION:  VITAL SIGNS:  Stable.  GENERAL:  The patient is somewhat somnolent due to pain medicine, but does  respond to questions.  ABDOMEN:  Soft and benign without masses or tenderness; no CVA pain.  Foley  catheter is in place.  EXTREMITIES:  She has a very painful left leg in her thigh and there is some  swelling in her left calf and a Doppler ultrasound that was just performed  showed a superficial clot in the calf, but not in the left anterior thigh.  GU:  Perineal sensation is intact.  Foley catheter is draining clear urine.   IMPRESSION:  1. Bladder outlet obstruction caused by urethral sling procedure done for     stress urinary incontinence over two years ago which decompensated      secondary to back pain, being immobile and pain medicines.  2. Renal insufficiency, now resolved.  3. Superficial thrombophlebitis left leg.   RECOMMENDATIONS:  Leave Foley catheter until the patient is ambulatory.  Will check the renal function one more time tomorrow to confirm that it is  indeed normal.  The renal ultrasound is negative so she does not have  hydronephrosis or stones and once she is ambulatory I would take out the  catheter and perform a voiding trial.  I would not take it out until she is  ambulatory which may be some time due to the recent blood clot just  discovered, but she should get back to her status quo.  I think she just  decompensated her bladder which was able to function after the two previous  surgeries until this most recent episode.   I will check back with you in a few days and be glad to follow the patient  up in the office.                                               Rozanna Boer., M.D.    HMK/MEDQ  D:  08/13/2002  T:  08/14/2002  Job:  [id number]

## 2010-06-27 ENCOUNTER — Other Ambulatory Visit: Payer: Self-pay | Admitting: Anesthesiology

## 2010-06-27 DIAGNOSIS — M542 Cervicalgia: Secondary | ICD-10-CM

## 2010-07-05 ENCOUNTER — Ambulatory Visit
Admission: RE | Admit: 2010-07-05 | Discharge: 2010-07-05 | Disposition: A | Discharge status: Home / Self Care | Payer: PRIVATE HEALTH INSURANCE | Source: Ambulatory Visit | Attending: Anesthesiology | Admitting: Anesthesiology

## 2010-07-05 DIAGNOSIS — M542 Cervicalgia: Secondary | ICD-10-CM

## 2010-07-07 ENCOUNTER — Encounter: Payer: Self-pay | Admitting: Family Medicine

## 2010-07-08 ENCOUNTER — Institutional Professional Consult (permissible substitution): Payer: Self-pay | Admitting: Family Medicine

## 2010-08-15 ENCOUNTER — Encounter (INDEPENDENT_AMBULATORY_CARE_PROVIDER_SITE_OTHER): Payer: Self-pay | Admitting: Ophthalmology

## 2010-08-15 ENCOUNTER — Encounter (INDEPENDENT_AMBULATORY_CARE_PROVIDER_SITE_OTHER): Payer: PRIVATE HEALTH INSURANCE | Admitting: Ophthalmology

## 2010-08-15 DIAGNOSIS — Z09 Encounter for follow-up examination after completed treatment for conditions other than malignant neoplasm: Secondary | ICD-10-CM

## 2010-08-15 DIAGNOSIS — H43819 Vitreous degeneration, unspecified eye: Secondary | ICD-10-CM

## 2010-09-28 ENCOUNTER — Ambulatory Visit (INDEPENDENT_AMBULATORY_CARE_PROVIDER_SITE_OTHER): Payer: PRIVATE HEALTH INSURANCE | Admitting: Family Medicine

## 2010-09-28 ENCOUNTER — Encounter: Payer: Self-pay | Admitting: Family Medicine

## 2010-09-28 VITALS — BP 140/90 | HR 72 | Ht 65.0 in | Wt 162.0 lb

## 2010-09-28 DIAGNOSIS — M542 Cervicalgia: Secondary | ICD-10-CM

## 2010-09-28 DIAGNOSIS — G8929 Other chronic pain: Secondary | ICD-10-CM

## 2010-09-28 NOTE — Progress Notes (Signed)
  Subjective:    Patient ID: [name] Long, female    DOB: [birth year], 70 y.o.   MRN: [id number]  HPI She is here for consultation concerning continued difficulty with neck and back pain. She presently is being followed by Dr. Vear Clock for her pain management. He did a recent MRI which I reviewed. It does show extensive stenosis as well as some disc bulging. He recommended neurosurgical evaluation. Her insurance does not cover any local neurosurgeons.   Review of Systems     Objective:   Physical Exam Alert and in no distress. The MRI was reviewed.       Assessment & Plan:  Chronic neck and back pain She will be referred to [facility] neurosurgery. I encouraged her to get copies of all her medical records and have them burn a copy of the MRI to take with her

## 2010-09-29 LAB — POCT CARDIAC MARKERS
CKMB, poc: 2.2
Operator id: 234501
Troponin i, poc: <0.05

## 2010-10-11 LAB — POCT I-STAT CREATININE
Creatinine, Ser: 0.9
Operator id: 196461

## 2010-10-11 LAB — I-STAT 8, (EC8 V) (CONVERTED LAB)
Bicarbonate: 25 — ABNORMAL HIGH
HCT: 45
Operator id: 196461
Potassium: 3.9
TCO2: 26

## 2010-10-11 LAB — DIFFERENTIAL
Basophils Absolute: 0.1
Basophils Relative: 0
Eosinophils Relative: 0
Lymphocytes Relative: 7 — ABNORMAL LOW
Lymphs Abs: 1.1

## 2010-10-11 LAB — URINALYSIS, ROUTINE W REFLEX MICROSCOPIC
Glucose, UA: NEGATIVE
Hgb urine dipstick: NEGATIVE
Ketones, ur: NEGATIVE
Urobilinogen, UA: 0.2
pH: 6.5

## 2010-10-11 LAB — CBC: WBC: 14.8 — ABNORMAL HIGH

## 2011-01-03 DIAGNOSIS — I219 Acute myocardial infarction, unspecified: Secondary | ICD-10-CM

## 2011-01-03 HISTORY — PX: CARDIAC CATHETERIZATION: SHX172

## 2011-01-03 HISTORY — DX: Acute myocardial infarction, unspecified: I21.9

## 2011-01-11 ENCOUNTER — Telehealth: Payer: Self-pay | Admitting: Internal Medicine

## 2011-01-11 MED ORDER — ALBUTEROL SULFATE HFA 108 (90 BASE) MCG/ACT IN AERS: 1.0000 | INHALATION_SPRAY | Freq: Four times a day (QID) | RESPIRATORY_TRACT | Status: DC | PRN

## 2011-01-11 NOTE — Telephone Encounter (Signed)
Refilled proair  

## 2011-01-12 ENCOUNTER — Other Ambulatory Visit: Payer: Self-pay

## 2011-01-12 ENCOUNTER — Inpatient Hospital Stay (HOSPITAL_COMMUNITY)
Admission: EM | Admit: 2011-01-12 | Discharge: 2011-01-21 | DRG: 234 | Disposition: A | Discharge status: Home / Self Care | Payer: PRIVATE HEALTH INSURANCE | Attending: Cardiothoracic Surgery | Admitting: Cardiothoracic Surgery

## 2011-01-12 ENCOUNTER — Emergency Department (HOSPITAL_COMMUNITY): Payer: PRIVATE HEALTH INSURANCE

## 2011-01-12 ENCOUNTER — Encounter: Payer: Self-pay | Admitting: Family Medicine

## 2011-01-12 ENCOUNTER — Ambulatory Visit (INDEPENDENT_AMBULATORY_CARE_PROVIDER_SITE_OTHER): Payer: PRIVATE HEALTH INSURANCE | Admitting: Family Medicine

## 2011-01-12 ENCOUNTER — Encounter (HOSPITAL_COMMUNITY): Payer: Self-pay

## 2011-01-12 DIAGNOSIS — D696 Thrombocytopenia, unspecified: Secondary | ICD-10-CM | POA: Diagnosis not present

## 2011-01-12 DIAGNOSIS — I200 Unstable angina: Secondary | ICD-10-CM | POA: Diagnosis present

## 2011-01-12 DIAGNOSIS — G8929 Other chronic pain: Secondary | ICD-10-CM | POA: Diagnosis present

## 2011-01-12 DIAGNOSIS — I472 Ventricular tachycardia, unspecified: Secondary | ICD-10-CM | POA: Diagnosis not present

## 2011-01-12 DIAGNOSIS — M899 Disorder of bone, unspecified: Secondary | ICD-10-CM | POA: Diagnosis present

## 2011-01-12 DIAGNOSIS — Z951 Presence of aortocoronary bypass graft: Secondary | ICD-10-CM

## 2011-01-12 DIAGNOSIS — H538 Other visual disturbances: Secondary | ICD-10-CM | POA: Diagnosis not present

## 2011-01-12 DIAGNOSIS — K3189 Other diseases of stomach and duodenum: Secondary | ICD-10-CM | POA: Diagnosis not present

## 2011-01-12 DIAGNOSIS — M797 Fibromyalgia: Secondary | ICD-10-CM | POA: Diagnosis present

## 2011-01-12 DIAGNOSIS — R9431 Abnormal electrocardiogram [ECG] [EKG]: Secondary | ICD-10-CM

## 2011-01-12 DIAGNOSIS — I10 Essential (primary) hypertension: Secondary | ICD-10-CM | POA: Diagnosis present

## 2011-01-12 DIAGNOSIS — D62 Acute posthemorrhagic anemia: Secondary | ICD-10-CM | POA: Diagnosis not present

## 2011-01-12 DIAGNOSIS — Z7982 Long term (current) use of aspirin: Secondary | ICD-10-CM

## 2011-01-12 DIAGNOSIS — E8779 Other fluid overload: Secondary | ICD-10-CM | POA: Diagnosis not present

## 2011-01-12 DIAGNOSIS — D649 Anemia, unspecified: Secondary | ICD-10-CM | POA: Diagnosis not present

## 2011-01-12 DIAGNOSIS — R339 Retention of urine, unspecified: Secondary | ICD-10-CM | POA: Diagnosis not present

## 2011-01-12 DIAGNOSIS — E8770 Fluid overload, unspecified: Secondary | ICD-10-CM | POA: Diagnosis present

## 2011-01-12 DIAGNOSIS — R079 Chest pain, unspecified: Secondary | ICD-10-CM

## 2011-01-12 DIAGNOSIS — Z87891 Personal history of nicotine dependence: Secondary | ICD-10-CM

## 2011-01-12 DIAGNOSIS — IMO0001 Reserved for inherently not codable concepts without codable children: Secondary | ICD-10-CM | POA: Diagnosis present

## 2011-01-12 DIAGNOSIS — J45909 Unspecified asthma, uncomplicated: Secondary | ICD-10-CM | POA: Diagnosis present

## 2011-01-12 DIAGNOSIS — Z981 Arthrodesis status: Secondary | ICD-10-CM

## 2011-01-12 DIAGNOSIS — I2510 Atherosclerotic heart disease of native coronary artery without angina pectoris: Principal | ICD-10-CM | POA: Diagnosis present

## 2011-01-12 DIAGNOSIS — I4729 Other ventricular tachycardia: Secondary | ICD-10-CM | POA: Diagnosis not present

## 2011-01-12 DIAGNOSIS — M549 Dorsalgia, unspecified: Secondary | ICD-10-CM | POA: Diagnosis present

## 2011-01-12 DIAGNOSIS — Z79899 Other long term (current) drug therapy: Secondary | ICD-10-CM

## 2011-01-12 DIAGNOSIS — R739 Hyperglycemia, unspecified: Secondary | ICD-10-CM | POA: Diagnosis not present

## 2011-01-12 HISTORY — DX: Abnormal electrocardiogram (ECG) (EKG): R94.31

## 2011-01-12 HISTORY — DX: Other chronic pain: G89.29

## 2011-01-12 HISTORY — DX: Dorsalgia, unspecified: M54.9

## 2011-01-12 HISTORY — DX: Unstable angina: I20.0

## 2011-01-12 LAB — COMPREHENSIVE METABOLIC PANEL
Alkaline Phosphatase: 107 U/L (ref 39–117)
BUN: 17 mg/dL (ref 6–23)
Calcium: 9.6 mg/dL (ref 8.4–10.5)
Creatinine, Ser: 0.87 mg/dL (ref 0.50–1.10)
GFR calc Af Amer: 77 mL/min — ABNORMAL LOW (ref >90)
Glucose, Bld: 107 mg/dL — ABNORMAL HIGH (ref 70–99)
Potassium: 3.8 mEq/L (ref 3.5–5.1)
Total Protein: 8 g/dL (ref 6.0–8.3)

## 2011-01-12 LAB — URINALYSIS, ROUTINE W REFLEX MICROSCOPIC
Glucose, UA: NEGATIVE mg/dL
Hgb urine dipstick: NEGATIVE
Protein, ur: NEGATIVE mg/dL

## 2011-01-12 LAB — DIFFERENTIAL
Lymphocytes Relative: 32 % (ref 12–46)
Lymphs Abs: 2.1 10*3/uL (ref 0.7–4.0)
Monocytes Absolute: 0.4 10*3/uL (ref 0.1–1.0)
Monocytes Relative: 7 % (ref 3–12)
Neutro Abs: 3.8 10*3/uL (ref 1.7–7.7)

## 2011-01-12 LAB — CBC
HCT: 45.7 % (ref 36.0–46.0)
Hemoglobin: 15.5 g/dL — ABNORMAL HIGH (ref 12.0–15.0)
MCV: 89.1 fL (ref 78.0–100.0)
WBC: 6.6 10*3/uL (ref 4.0–10.5)

## 2011-01-12 LAB — URINE CULTURE: Colony Count: >=100000

## 2011-01-12 LAB — LIPID PANEL
Cholesterol: 238 mg/dL — ABNORMAL HIGH (ref 0–200)
Total CHOL/HDL Ratio: 2.2 RATIO
Triglycerides: 98 mg/dL (ref <150)
VLDL: 20 mg/dL (ref 0–40)

## 2011-01-12 LAB — PROTIME-INR: Prothrombin Time: 12.5 seconds (ref 11.6–15.2)

## 2011-01-12 LAB — CARDIAC PANEL(CRET KIN+CKTOT+MB+TROPI)
Relative Index: 3.8 — ABNORMAL HIGH (ref 0.0–2.5)
Troponin I: <0.3 ng/mL (ref <0.30)

## 2011-01-12 LAB — TSH: TSH: 0.994 u[IU]/mL (ref 0.350–4.500)

## 2011-01-12 MED ORDER — FLUTICASONE-SALMETEROL 230-21 MCG/ACT IN AERO: 2.0000 | INHALATION_SPRAY | Freq: Two times a day (BID) | RESPIRATORY_TRACT | Status: DC

## 2011-01-12 MED ORDER — SODIUM CHLORIDE 0.9 % IV SOLN: 1.0000 mL/kg/h | INTRAVENOUS | Status: DC

## 2011-01-12 MED ORDER — SODIUM CHLORIDE 0.9 % IJ SOLN: 3.0000 mL | INTRAMUSCULAR | Status: DC | PRN

## 2011-01-12 MED ORDER — ROSUVASTATIN CALCIUM 10 MG PO TABS
10.0000 mg | ORAL_TABLET | Freq: Every day | ORAL | Status: DC
  Administered 2011-01-15 – 2011-01-21 (×7): 10 mg via ORAL
  Filled 2011-01-12 (×10): qty 1

## 2011-01-12 MED ORDER — HYDROXYCHLOROQUINE SULFATE 200 MG PO TABS
200.0000 mg | ORAL_TABLET | Freq: Every day | ORAL | Status: DC
  Administered 2011-01-13: 200 mg via ORAL
  Filled 2011-01-12 (×2): qty 1

## 2011-01-12 MED ORDER — HEPARIN BOLUS VIA INFUSION
3500.0000 [IU] | Freq: Once | INTRAVENOUS | Status: AC
  Administered 2011-01-12: 3500 [IU] via INTRAVENOUS
  Filled 2011-01-12: qty 3500

## 2011-01-12 MED ORDER — HEPARIN NICU/PEDS BOLUS VIA INFUSION
3500.0000 [IU] | Freq: Once | INTRAVENOUS | Status: DC
  Filled 2011-01-12: qty 3500

## 2011-01-12 MED ORDER — ASPIRIN 81 MG PO CHEW
324.0000 mg | CHEWABLE_TABLET | ORAL | Status: AC
  Administered 2011-01-13: 324 mg via ORAL
  Filled 2011-01-12 (×2): qty 4

## 2011-01-12 MED ORDER — HYDROMORPHONE HCL 2 MG PO TABS
2.0000 mg | ORAL_TABLET | ORAL | Status: DC | PRN
  Administered 2011-01-12 – 2011-01-13 (×2): 2 mg via ORAL
  Filled 2011-01-12 (×2): qty 1

## 2011-01-12 MED ORDER — ALBUTEROL SULFATE HFA 108 (90 BASE) MCG/ACT IN AERS
1.0000 | INHALATION_SPRAY | Freq: Four times a day (QID) | RESPIRATORY_TRACT | Status: DC | PRN
  Filled 2011-01-12: qty 6.7

## 2011-01-12 MED ORDER — ZOLPIDEM TARTRATE 5 MG PO TABS: 5.0000 mg | ORAL_TABLET | Freq: Every evening | ORAL | Status: DC | PRN

## 2011-01-12 MED ORDER — SODIUM CHLORIDE 0.9 % IV SOLN: 250.0000 mL | INTRAVENOUS | Status: DC | PRN

## 2011-01-12 MED ORDER — ADULT MULTIVITAMIN W/MINERALS CH
1.0000 | ORAL_TABLET | Freq: Every day | ORAL | Status: DC
  Administered 2011-01-13: 1 via ORAL
  Filled 2011-01-12 (×2): qty 1

## 2011-01-12 MED ORDER — SODIUM CHLORIDE 0.9 % IV SOLN
INTRAVENOUS | Status: DC
  Administered 2011-01-12: 18:00:00 via INTRAVENOUS

## 2011-01-12 MED ORDER — ASPIRIN 300 MG RE SUPP
300.0000 mg | RECTAL | Status: AC
  Filled 2011-01-12: qty 1

## 2011-01-12 MED ORDER — ASPIRIN 81 MG PO CHEW: 324.0000 mg | CHEWABLE_TABLET | ORAL | Status: DC

## 2011-01-12 MED ORDER — VITAMIN D3 25 MCG (1000 UNIT) PO TABS
2000.0000 [IU] | ORAL_TABLET | Freq: Every day | ORAL | Status: DC
  Administered 2011-01-13: 2000 [IU] via ORAL
  Filled 2011-01-12 (×2): qty 2

## 2011-01-12 MED ORDER — HEPARIN SOD (PORCINE) IN D5W 100 UNIT/ML IV SOLN
1000.0000 [IU]/h | INTRAVENOUS | Status: DC
  Administered 2011-01-12: 1000 [IU]/h via INTRAVENOUS
  Filled 2011-01-12: qty 250

## 2011-01-12 MED ORDER — FLUTICASONE-SALMETEROL 250-50 MCG/DOSE IN AEPB
1.0000 | INHALATION_SPRAY | Freq: Two times a day (BID) | RESPIRATORY_TRACT | Status: DC
  Administered 2011-01-13 – 2011-01-20 (×13): 1 via RESPIRATORY_TRACT
  Filled 2011-01-12: qty 14

## 2011-01-12 MED ORDER — SODIUM CHLORIDE 0.9 % IJ SOLN
3.0000 mL | Freq: Two times a day (BID) | INTRAMUSCULAR | Status: DC
  Administered 2011-01-13: 3 mL via INTRAVENOUS

## 2011-01-12 MED ORDER — CARISOPRODOL 350 MG PO TABS
350.0000 mg | ORAL_TABLET | Freq: Four times a day (QID) | ORAL | Status: DC | PRN
  Administered 2011-01-13 – 2011-01-20 (×14): 350 mg via ORAL
  Filled 2011-01-12 (×15): qty 1

## 2011-01-12 MED ORDER — SODIUM CHLORIDE 0.9 % IJ SOLN: 3.0000 mL | Freq: Two times a day (BID) | INTRAMUSCULAR | Status: DC

## 2011-01-12 MED ORDER — METOPROLOL TARTRATE 12.5 MG HALF TABLET
12.5000 mg | ORAL_TABLET | Freq: Two times a day (BID) | ORAL | Status: DC
  Administered 2011-01-13 (×3): 12.5 mg via ORAL
  Filled 2011-01-12 (×3): qty 1

## 2011-01-12 MED ORDER — NITROGLYCERIN 0.4 MG SL SUBL: 0.4000 mg | SUBLINGUAL_TABLET | SUBLINGUAL | Status: DC | PRN

## 2011-01-12 MED ORDER — MONTELUKAST SODIUM 10 MG PO TABS
10.0000 mg | ORAL_TABLET | Freq: Every day | ORAL | Status: DC
  Administered 2011-01-13 – 2011-01-21 (×9): 10 mg via ORAL
  Filled 2011-01-12 (×10): qty 1

## 2011-01-12 MED ORDER — DIAZEPAM 5 MG PO TABS
5.0000 mg | ORAL_TABLET | ORAL | Status: AC
  Administered 2011-01-13: 5 mg via ORAL
  Filled 2011-01-12: qty 1

## 2011-01-12 MED ORDER — ONDANSETRON HCL 4 MG/2ML IJ SOLN: 4.0000 mg | Freq: Four times a day (QID) | INTRAMUSCULAR | Status: DC | PRN

## 2011-01-12 MED ORDER — SODIUM CHLORIDE 0.9 % IV SOLN: INTRAVENOUS | Status: DC

## 2011-01-12 MED ORDER — ASPIRIN 81 MG PO CHEW
324.0000 mg | CHEWABLE_TABLET | ORAL | Status: AC
  Administered 2011-01-13: 324 mg via ORAL

## 2011-01-12 MED ORDER — GABAPENTIN 600 MG PO TABS
600.0000 mg | ORAL_TABLET | Freq: Four times a day (QID) | ORAL | Status: DC
  Administered 2011-01-13 (×4): 600 mg via ORAL
  Filled 2011-01-12 (×6): qty 1

## 2011-01-12 MED ORDER — DIAZEPAM 5 MG PO TABS: 5.0000 mg | ORAL_TABLET | ORAL | Status: DC

## 2011-01-12 MED ORDER — ACETAMINOPHEN 325 MG PO TABS
650.0000 mg | ORAL_TABLET | ORAL | Status: DC | PRN
  Administered 2011-01-12: 650 mg via ORAL
  Filled 2011-01-12: qty 2

## 2011-01-12 MED ORDER — NITROGLYCERIN IN D5W 200-5 MCG/ML-% IV SOLN
5.0000 ug/min | INTRAVENOUS | Status: DC
  Administered 2011-01-12: 5 ug/min via INTRAVENOUS
  Filled 2011-01-12: qty 250

## 2011-01-12 MED ORDER — VITAMIN D 50 MCG (2000 UT) PO CAPS
2000.0000 [IU] | ORAL_CAPSULE | Freq: Every day | ORAL | Status: DC
Start: 2011-01-12 — End: 2011-01-12

## 2011-01-12 MED ORDER — FENTANYL 50 MCG/HR TD PT72
50.0000 ug | MEDICATED_PATCH | TRANSDERMAL | Status: DC
  Administered 2011-01-13 – 2011-01-19 (×4): 50 ug via TRANSDERMAL
  Filled 2011-01-12 (×2): qty 1
  Filled 2011-01-12: qty 2
  Filled 2011-01-12 (×2): qty 1

## 2011-01-12 MED ORDER — CALCIUM CARBONATE-VITAMIN D 500-200 MG-UNIT PO TABS
1.0000 | ORAL_TABLET | Freq: Every day | ORAL | Status: DC
  Administered 2011-01-13: 1 via ORAL
  Filled 2011-01-12 (×2): qty 1

## 2011-01-12 MED ORDER — ASPIRIN EC 81 MG PO TBEC
81.0000 mg | DELAYED_RELEASE_TABLET | Freq: Every day | ORAL | Status: DC
  Filled 2011-01-12 (×2): qty 1

## 2011-01-12 MED ORDER — CALCIUM CARBONATE-VITAMIN D 250-125 MG-UNIT PO TABS: 1.0000 | ORAL_TABLET | Freq: Every day | ORAL | Status: DC

## 2011-01-12 NOTE — ED Notes (Signed)
Spoke with Vernona Rieger of [facility]; to come and see pt

## 2011-01-12 NOTE — Progress Notes (Signed)
ANTICOAGULATION CONSULT NOTE - Initial Consult  Pharmacy Consult for Heparin Indication: chest pain/ACS  No Known Allergies  Patient Measurements:   Vital Signs: Temp: 98.7 F (37.1 C) (01/10 1702) Temp src: Oral (01/10 1702) BP: 191/97 mmHg (01/10 1702) Pulse Rate: 86  (01/10 1702)  Labs:  Basename 01/12/11 1718  HGB 15.5*  HCT 45.7  PLT 182  APTT 34  LABPROT 12.5  INR 0.91  HEPARINUNFRC --  CREATININE --  CKTOTAL --  CKMB --  TROPONINI --   The CrCl is unknown because both a height and weight (above a minimum accepted value) are required for this calculation.  Medical History: Past Medical History  Diagnosis Date  . Allergy     RHINITIS  . Asthma   . Fibromyalgia   . Fibromyalgia   . Chronic low back pain   . Osteoporosis     OSTEOPENIA  . PPD positive   . Connective tissue disease   . Urinary incontinence   . Hypertension   . Crescendo angina, with EKG changes, new. 01/12/2011  . Abnormal EKG 01/12/2011  . Back pain, chronic--plans were for cervical disc surgery week of 01/16/11 01/12/2011  . Asthma 01/12/2011    Medications  Assessment: Pt was sent from PCP office with chest pain radiating down left arm and irregular ekg.  Goal of Therapy:  Heparin level 0.3-0.7 units/ml   Plan:  Heparin bolus 3500 units then heparin drip at 1000 units/hr. Will check heparin level in 6 hrs, then daily heparin level and CBC.  Eugene Garnet 01/12/2011,6:01 PM

## 2011-01-12 NOTE — ED Notes (Signed)
Pt states that today she started to have severe substernal chest pain that felt like it was radiating down her left arm. She went to pcp office and was told to come in to the er for admission to the hospital for irregular ekg. Nursing spoke with laura ingold who requested that she be paged upon arrival. Pt states that she is still having substernal chest pain and pain down her left arm. No meds pta.

## 2011-01-12 NOTE — Patient Instructions (Signed)
Go directly to come hospital and asked for  Nada Boozer

## 2011-01-12 NOTE — Progress Notes (Signed)
  Subjective:    Patient ID: [name] Long, female    DOB: 01-17-1940, 71 y.o.   MRN: [id number]  HPI She is here for evaluation of 3 episodes of chest pain. The first one was approximately one week ago and associated with some shortness of breath and left arm pain. She had 2 of these episodes yesterday. One of them lasted approximately 10 minutes. She had no diaphoresis or weakness, nausea vomiting. She does not smoke. Doesn't have a family history of heart disease. She is scheduled for cervical disc surgery at [facility] next Wednesday.   Review of Systems     Objective:   Physical Exam Alert and in no distress. Cardiac exam shows regular rhythm without murmurs or gallops. Lungs are clear to auscultation. EKG does show T-wave inversions in V1 through 4.       Assessment & Plan:   1. Chest pain  PR ELECTROCARDIOGRAM, COMPLETE  2. Abnormal EKG    3. Crescendo angina, with EKG changes, new.    4 asthma Case was discussed with Dr. Herbie Baltimore. She will be sent to [facility]  hospital for evaluation and treatment.

## 2011-01-12 NOTE — ED Notes (Signed)
Heparin from pharmacy has no barcode on it.  Issue brought up with pharmacy tech.  Pharmacy tech states heparin has been verified with pharmacist and they are aware that we can not scan it in.

## 2011-01-12 NOTE — H&P (Signed)
[name] Long is an 71 y.o. female.    Primary Care Physician: Dr. Sharlot Gowda  Chief Complaint: Reoccurring chest pain HPI: 71 year old white married female presented to her primary care physician's office today with increasing episodes of chest pain. Earlier this week she had an episode of crushing chest pain with associated shortness of breath. Then yesterday she had a return of crushing chest pain again with associated shortness of breath. Last evening she had another episode of chest discomfort they're not quite as severe. She was seen by Dr. Susann Givens and EKG was done which had changed he now has flipped T waves in leads V1 through V4. She was sent emergently to the emergency room. She has been seen by Dr. York Ram and evaluated.  Currently on exam she had mild chest discomfort, IV heparin and IV nitroglycerin were started and at this time the chest pain is completely resolved.  Initial cardiac enzymes are negative. But due to the significance of her chest pain with abnormal EKG plans will be to do cardiac catheterization on 01/13/2011. Patient is adopted we do not know her family history.  She denies diabetes no history of stroke or heart attack no hypertension no elevated cholesterol. She does have a history of asthma and uses inhalers as well as Singulair.  In 2004 she was hospitalized for sepsis at that time she underwent a 2-D echo. EF was normal. There were no significant valvular issues  Please note patient was to undergo cervical disc surgery at [facility] next week for significant back pain. Dr. Allyson Sabal discussed bare-metal stents as well as drug alluding stents with the patient and has recommended bare-metal stent due to the severity of her neck pain.  She is on po dilauded as well as fentanyl patch and takes soma as well.    Past Medical History  Diagnosis Date  . Allergy     RHINITIS  . Asthma   . Fibromyalgia   . Fibromyalgia   . Chronic low back pain   . Osteoporosis    OSTEOPENIA  . PPD positive   . Connective tissue disease   . Urinary incontinence   . Hypertension   . Crescendo angina, with EKG changes, new. 01/12/2011  . Abnormal EKG 01/12/2011  . Back pain, chronic--plans were for cervical disc surgery week of 01/16/11 01/12/2011  . Asthma 01/12/2011    Past Surgical History  Procedure Date  . Appendectomy   . Abdominal hysterectomy   . Bladder repair   . Cervical fusion   . Breast surgery     REDUCTION  . Carpal tunnel release     Family History  Problem Relation Age of Onset  . Adopted: Yes   Social History:  reports that she quit smoking about 31 years ago. She has never used smokeless tobacco. She reports that she does not drink alcohol or use illicit drugs. she is married and has 2 children and 7 grandchildren. She is retired from [employer] she did use tobacco but stopped approximately 31 years ago she did smoke 2-1/2 packs a day but she states most of them just burned.  Allergies: No Known Allergies  Medications Prior to Admission  Medication Dose Route Frequency Provider Last Rate Last Dose  . 0.9 %  sodium chloride infusion   Intravenous Continuous Leone Brand, NP 20 mL/hr at 01/12/11 1757    . heparin ADULT infusion 100 units/ml (25000 units/250 ml)  1,000 Units/hr Intravenous Continuous Lennette Bihari, MD 10 mL/hr at  01/12/11 1854 1,000 Units/hr at 01/12/11 1854  . heparin bolus via infusion 3,500 Units  3,500 Units Intravenous Once Lennette Bihari, MD   3,500 Units at 01/12/11 1853  . nitroGLYCERIN 0.2 mg/mL in dextrose 5 % infusion  5 mcg/min Intravenous Titrated Leone Brand, NP 1.5 mL/hr at 01/12/11 1757 5 mcg/min at 01/12/11 1757  . DISCONTD: heparin PEDS 100 units/mL bolus via infusion 3,500 Units  3,500 Units Intravenous Once Lennette Bihari, MD       Medications Prior to Admission  Medication Sig Dispense Refill  . albuterol (PROVENTIL HFA;VENTOLIN HFA) 108 (90 BASE) MCG/ACT inhaler Inhale 1 puff into the lungs  every 6 (six) hours as needed. For shortness of breath      . Calcium Carbonate-Vitamin D (CALCIUM + D PO) Take 1 tablet by mouth daily.        . carisoprodol (SOMA) 350 MG tablet Take 350 mg by mouth 4 (four) times daily as needed.        . fentaNYL (DURAGESIC - DOSED MCG/HR) 50 MCG/HR Place 1 patch onto the skin every 3 (three) days.        . fluticasone-salmeterol (ADVAIR HFA) 230-21 MCG/ACT inhaler Inhale 2 puffs into the lungs 2 (two) times daily.        Marland Kitchen gabapentin (NEURONTIN) 600 MG tablet Take 600 mg by mouth 4 (four) times daily.       Marland Kitchen HYDROmorphone (DILAUDID) 2 MG tablet Take 2 mg by mouth every 4 (four) hours as needed. For pain      . hydroxychloroquine (PLAQUENIL) 200 MG tablet Take 200 mg by mouth daily.       . montelukast (SINGULAIR) 10 MG tablet Take 10 mg by mouth daily.         Results for orders placed during the hospital encounter of 01/12/11 (from the past 48 hour(s))  CBC     Status: Abnormal   Collection Time   01/12/11  5:18 PM      Component Value Range Comment   WBC 6.6  4.0 - 10.5 (K/uL)    RBC 5.13 (*) 3.87 - 5.11 (MIL/uL)    Hemoglobin 15.5 (*) 12.0 - 15.0 (g/dL)    HCT 09.8  11.9 - 14.7 (%)    MCV 89.1  78.0 - 100.0 (fL)    MCH 30.2  26.0 - 34.0 (pg)    MCHC 33.9  30.0 - 36.0 (g/dL)    RDW 82.9  56.2 - 13.0 (%)    Platelets 182  150 - 400 (K/uL)   DIFFERENTIAL     Status: Normal   Collection Time   01/12/11  5:18 PM      Component Value Range Comment   Neutrophils Relative 58  43 - 77 (%)    Neutro Abs 3.8  1.7 - 7.7 (K/uL)    Lymphocytes Relative 32  12 - 46 (%)    Lymphs Abs 2.1  0.7 - 4.0 (K/uL)    Monocytes Relative 7  3 - 12 (%)    Monocytes Absolute 0.4  0.1 - 1.0 (K/uL)    Eosinophils Relative 4  0 - 5 (%)    Eosinophils Absolute 0.2  0.0 - 0.7 (K/uL)    Basophils Relative 1  0 - 1 (%)    Basophils Absolute 0.0  0.0 - 0.1 (K/uL)   CARDIAC PANEL(CRET KIN+CKTOT+MB+TROPI)     Status: Abnormal   Collection Time   01/12/11  5:18 PM  Component Value Range Comment   Total CK 138  7 - 177 (U/L)    CK, MB 5.3 (*) 0.3 - 4.0 (ng/mL)    Troponin I <0.30  <0.30 (ng/mL)    Relative Index 3.8 (*) 0.0 - 2.5    MAGNESIUM     Status: Normal   Collection Time   01/12/11  5:18 PM      Component Value Range Comment   Magnesium 1.9  1.5 - 2.5 (mg/dL)   COMPREHENSIVE METABOLIC PANEL     Status: Abnormal   Collection Time   01/12/11  5:18 PM      Component Value Range Comment   Sodium 138  135 - 145 (mEq/L)    Potassium 3.8  3.5 - 5.1 (mEq/L)    Chloride 100  96 - 112 (mEq/L)    CO2 27  19 - 32 (mEq/L)    Glucose, Bld 107 (*) 70 - 99 (mg/dL)    BUN 17  6 - 23 (mg/dL)    Creatinine, Ser 1.61  0.50 - 1.10 (mg/dL)    Calcium 9.6  8.4 - 10.5 (mg/dL)    Total Protein 8.0  6.0 - 8.3 (g/dL)    Albumin 4.4  3.5 - 5.2 (g/dL)    AST 26  0 - 37 (U/L)    ALT 17  0 - 35 (U/L)    Alkaline Phosphatase 107  39 - 117 (U/L)    Total Bilirubin 0.2 (*) 0.3 - 1.2 (mg/dL)    GFR calc non Af Amer 66 (*) >90 (mL/min)    GFR calc Af Amer 77 (*) >90 (mL/min)   PROTIME-INR     Status: Normal   Collection Time   01/12/11  5:18 PM      Component Value Range Comment   Prothrombin Time 12.5  11.6 - 15.2 (seconds)    INR 0.91  0.00 - 1.49    APTT     Status: Normal   Collection Time   01/12/11  5:18 PM      Component Value Range Comment   aPTT 34  24 - 37 (seconds)   LIPID PANEL     Status: Abnormal   Collection Time   01/12/11  5:18 PM      Component Value Range Comment   Cholesterol 238 (*) 0 - 200 (mg/dL)    Triglycerides 98  <096 (mg/dL)    HDL 045  >40 (mg/dL)    Total CHOL/HDL Ratio 2.2      VLDL 20  0 - 40 (mg/dL)    LDL Cholesterol 981 (*) 0 - 99 (mg/dL)   URINALYSIS, ROUTINE W REFLEX MICROSCOPIC     Status: Abnormal   Collection Time   01/12/11  5:44 PM      Component Value Range Comment   Color, Urine YELLOW  YELLOW     APPearance CLEAR  CLEAR     Specific Gravity, Urine 1.006  1.005 - 1.030     pH 6.5  5.0 - 8.0     Glucose, UA  NEGATIVE  NEGATIVE (mg/dL)    Hgb urine dipstick NEGATIVE  NEGATIVE     Bilirubin Urine NEGATIVE  NEGATIVE     Ketones, ur NEGATIVE  NEGATIVE (mg/dL)    Protein, ur NEGATIVE  NEGATIVE (mg/dL)    Urobilinogen, UA 0.2  0.0 - 1.0 (mg/dL)    Nitrite NEGATIVE  NEGATIVE     Leukocytes, UA MODERATE (*) NEGATIVE    URINE MICROSCOPIC-ADD ON  Status: Abnormal   Collection Time   01/12/11  5:44 PM      Component Value Range Comment   Squamous Epithelial / LPF FEW (*) RARE     WBC, UA 3-6  <3 (WBC/hpf)    RBC / HPF 0-2  <3 (RBC/hpf)    Bacteria, UA MANY (*) RARE     Dg Chest Port 1 View  01/12/2011  *RADIOLOGY REPORT*  Clinical Data: Chest and left arm pain with shortness of breath.  PORTABLE CHEST - 1 VIEW  Comparison: 07/19/2009.  Findings: Normal heart size with clear lung fields.  No bony abnormality.  IMPRESSION: No active cardiopulmonary disease.  No change from priors.  Original Report Authenticated By: Elsie Stain, M.D.    ROS: general: No colds or fevers, has chronic back and neck pain. Skin: No rashes or ulcers. HEENT: No blurred vision or double vision. Cardiovascular: No prior cardiac history except in 2004  for a mild leak of troponins with acute sepsis EF at that time was normal.  Pulmonary: She has a history of asthma that she uses inhalers currently has not had acute problems. Musculoskeletal: chronic significant neck and back pain as well as fibromyalgia she's had a cervical fusion in the past and was to undergo cervical disc surgery at [facility] next week the week of 01/16/2011. GU: history of bladder outlet obstruction and history of bladder suspension currently denies any complications. GI: no diarrhea constipation or melena. Neuro: No syncope lightheadedness or dizziness. Endocrine: no diabetes or thyroid disease.  Blood pressure 131/85, pulse 85, temperature 98.7 F (37.1 C), temperature source Oral, resp. rate 16, weight 73.936 kg (163 lb), SpO2 95.00%. PE: Gen.: Alert  oriented white female, pleasant affect. Mild chest discomfort. Skin: Warm and dry brisk. Refill, no rashes or ulcers. HEENT: Normocephalic, sclera clear. Neck: supple no JVD, no bruits, no thyromegaly. Heart: S1-S2 regular rate and rhythm without murmur gallop or click. Lungs: Clear without rales rhonchi or wheezes. Abdomen: Soft, nontender positive bowel sounds do not palpate liver spleen or masses. Extremities: No lower extremity edema 2+ pedal pulses bilaterally. Neuro: Alert and oriented x3, follows commands, moves all extremities. Positive facial symmetry.  EKG as described above which is new from previous EKGs.       Assessment/Plan Patient Active Problem List  Diagnoses  . Crescendo angina, with EKG changes, new.  . Abnormal EKG  . Back pain, chronic--plans were for cervical disc surgery week of 01/16/11  . Asthma  . Fibromyalgia   Plan: Patient was seen and examined by Dr.Alesa Echevarria. We'll begin IV heparin, IV nitroglycerin, serial cardiac enzymes and EKGs. Plan will be for cardiac catheterization 01/13/2011. Dr. Allyson Sabal discussed with the patient and her husband the risk of cardiac catheterization and the benefits and they have agreed to proceed with the heart catheterization. Admit to the coronary intensive care unit for close management.  INGOLD,LAURA R 01/12/2011, 7:26 PM  Agree with note written by Nada Boozer RNP  Pt seen and examined. Worrisome history for crescendo/USA. Scheduled for neck surgery next week for what sounds like cervical disk disease with radiculopathy. New anterolateral TWI worrisome for prox LAD disease. Exam benign. Labs otherwise ok. Plan to admit to stepdown, iv hep/ntg, cycle enz. Start low dose BB (h/o asthma). Plan cor angio Friday. Risks and benefits discussed with pt and husband. If intervention required would probably use BMS secondary for need of neck surgery for relief of pain/neurologic symptoms.  Runell Gess 01/13/2011 7:01 AM

## 2011-01-12 NOTE — ED Notes (Signed)
Called pharmacy to request heparin sent to er as the er is out of heparin in the pyxis.

## 2011-01-13 ENCOUNTER — Inpatient Hospital Stay (HOSPITAL_COMMUNITY): Payer: PRIVATE HEALTH INSURANCE

## 2011-01-13 ENCOUNTER — Encounter (HOSPITAL_COMMUNITY)
Admission: EM | Disposition: A | Discharge status: Home / Self Care | Payer: Self-pay | Source: Home / Self Care | Attending: Cardiothoracic Surgery

## 2011-01-13 ENCOUNTER — Other Ambulatory Visit: Payer: Self-pay

## 2011-01-13 DIAGNOSIS — I2510 Atherosclerotic heart disease of native coronary artery without angina pectoris: Secondary | ICD-10-CM

## 2011-01-13 DIAGNOSIS — Z01810 Encounter for preprocedural cardiovascular examination: Secondary | ICD-10-CM

## 2011-01-13 HISTORY — PX: LEFT HEART CATHETERIZATION WITH CORONARY ANGIOGRAM: SHX5451

## 2011-01-13 LAB — BASIC METABOLIC PANEL
BUN: 15 mg/dL (ref 6–23)
CO2: 26 mEq/L (ref 19–32)
Calcium: 9.1 mg/dL (ref 8.4–10.5)
Chloride: 106 mEq/L (ref 96–112)
Creatinine, Ser: 0.84 mg/dL (ref 0.50–1.10)
GFR calc Af Amer: 80 mL/min — ABNORMAL LOW (ref >90)
GFR calc non Af Amer: 69 mL/min — ABNORMAL LOW (ref >90)
Glucose, Bld: 101 mg/dL — ABNORMAL HIGH (ref 70–99)
Potassium: 4.1 mEq/L (ref 3.5–5.1)
Sodium: 140 mEq/L (ref 135–145)

## 2011-01-13 LAB — PROTIME-INR: INR: 1.04 (ref 0.00–1.49)

## 2011-01-13 LAB — CBC
MCH: 29.7 pg (ref 26.0–34.0)
MCHC: 33.5 g/dL (ref 30.0–36.0)
MCV: 88.8 fL (ref 78.0–100.0)
Platelets: 165 10*3/uL (ref 150–400)
RDW: 12.6 % (ref 11.5–15.5)
WBC: 7.7 10*3/uL (ref 4.0–10.5)

## 2011-01-13 LAB — CARDIAC PANEL(CRET KIN+CKTOT+MB+TROPI)
CK, MB: 4.7 ng/mL — ABNORMAL HIGH (ref 0.3–4.0)
CK, MB: 4.8 ng/mL — ABNORMAL HIGH (ref 0.3–4.0)
Total CK: 110 U/L (ref 7–177)
Total CK: 99 U/L (ref 7–177)
Troponin I: <0.3 ng/mL (ref <0.30)
Troponin I: <0.3 ng/mL (ref <0.30)

## 2011-01-13 SURGERY — LEFT HEART CATHETERIZATION WITH CORONARY ANGIOGRAM
Anesthesia: LOCAL

## 2011-01-13 MED ORDER — GABAPENTIN 600 MG PO TABS
600.0000 mg | ORAL_TABLET | Freq: Two times a day (BID) | ORAL | Status: DC
  Administered 2011-01-13 – 2011-01-21 (×14): 600 mg via ORAL
  Filled 2011-01-13 (×18): qty 1

## 2011-01-13 MED ORDER — NITROGLYCERIN IN D5W 200-5 MCG/ML-% IV SOLN
2.0000 ug/min | INTRAVENOUS | Status: AC
  Administered 2011-01-14: 16 ug/min via INTRAVENOUS

## 2011-01-13 MED ORDER — MAGNESIUM SULFATE 50 % IJ SOLN
40.0000 meq | INTRAMUSCULAR | Status: DC
  Filled 2011-01-13: qty 10

## 2011-01-13 MED ORDER — ALPRAZOLAM 0.25 MG PO TABS: 0.2500 mg | ORAL_TABLET | ORAL | Status: DC | PRN

## 2011-01-13 MED ORDER — SODIUM CHLORIDE 0.9 % IV SOLN
0.1000 ug/kg/h | INTRAVENOUS | Status: AC
  Administered 2011-01-14: .2 ug/kg/h via INTRAVENOUS
  Filled 2011-01-13: qty 4

## 2011-01-13 MED ORDER — CHLORHEXIDINE GLUCONATE 4 % EX LIQD
60.0000 mL | Freq: Once | CUTANEOUS | Status: AC
  Administered 2011-01-13: 4 via TOPICAL
  Filled 2011-01-13: qty 60

## 2011-01-13 MED ORDER — DEXTROSE 5 % IV SOLN
750.0000 mg | INTRAVENOUS | Status: DC
  Filled 2011-01-13 (×2): qty 750

## 2011-01-13 MED ORDER — GABAPENTIN 400 MG PO CAPS
1200.0000 mg | ORAL_CAPSULE | Freq: Every day | ORAL | Status: DC
  Administered 2011-01-13: 600 mg via ORAL
  Administered 2011-01-14 – 2011-01-20 (×7): 1200 mg via ORAL
  Filled 2011-01-13 (×9): qty 3

## 2011-01-13 MED ORDER — POTASSIUM CHLORIDE 2 MEQ/ML IV SOLN
80.0000 meq | INTRAVENOUS | Status: DC
  Filled 2011-01-13: qty 40

## 2011-01-13 MED ORDER — DOPAMINE-DEXTROSE 3.2-5 MG/ML-% IV SOLN
2.0000 ug/kg/min | INTRAVENOUS | Status: DC
  Filled 2011-01-13: qty 250

## 2011-01-13 MED ORDER — NITROGLYCERIN 0.2 MG/ML ON CALL CATH LAB
INTRAVENOUS | Status: AC
  Filled 2011-01-13: qty 1

## 2011-01-13 MED ORDER — HEPARIN SOD (PORCINE) IN D5W 100 UNIT/ML IV SOLN
1000.0000 [IU]/h | INTRAVENOUS | Status: DC
  Filled 2011-01-13: qty 250

## 2011-01-13 MED ORDER — PLASMA-LYTE 148 IV SOLN
INTRAVENOUS | Status: AC
  Administered 2011-01-14: 11:00:00
  Filled 2011-01-13: qty 0.5

## 2011-01-13 MED ORDER — NITROGLYCERIN IN D5W 200-5 MCG/ML-% IV SOLN
2.0000 ug/min | INTRAVENOUS | Status: DC
  Filled 2011-01-13 (×2): qty 250

## 2011-01-13 MED ORDER — FENTANYL CITRATE 0.05 MG/ML IJ SOLN
INTRAMUSCULAR | Status: AC
  Filled 2011-01-13: qty 2

## 2011-01-13 MED ORDER — PHENYLEPHRINE HCL 10 MG/ML IJ SOLN
30.0000 ug/min | INTRAVENOUS | Status: DC
  Filled 2011-01-13: qty 2

## 2011-01-13 MED ORDER — EPINEPHRINE HCL 1 MG/ML IJ SOLN
0.5000 ug/min | INTRAVENOUS | Status: DC
  Filled 2011-01-13: qty 4

## 2011-01-13 MED ORDER — METOPROLOL TARTRATE 12.5 MG HALF TABLET
12.5000 mg | ORAL_TABLET | Freq: Two times a day (BID) | ORAL | Status: DC
  Filled 2011-01-13: qty 1

## 2011-01-13 MED ORDER — CHLORHEXIDINE GLUCONATE 4 % EX LIQD
60.0000 mL | Freq: Once | CUTANEOUS | Status: AC
  Administered 2011-01-14: 4 via TOPICAL

## 2011-01-13 MED ORDER — CHLORHEXIDINE GLUCONATE 4 % EX LIQD
60.0000 mL | Freq: Once | CUTANEOUS | Status: DC
  Filled 2011-01-13: qty 60

## 2011-01-13 MED ORDER — SODIUM CHLORIDE 0.9 % IV SOLN
INTRAVENOUS | Status: DC
  Administered 2011-01-13 (×2): via INTRAVENOUS

## 2011-01-13 MED ORDER — HEPARIN (PORCINE) IN NACL 2-0.9 UNIT/ML-% IJ SOLN
INTRAMUSCULAR | Status: AC
  Filled 2011-01-13: qty 1000

## 2011-01-13 MED ORDER — MIDAZOLAM HCL 2 MG/2ML IJ SOLN
INTRAMUSCULAR | Status: AC
  Filled 2011-01-13: qty 2

## 2011-01-13 MED ORDER — HYDROXYCHLOROQUINE SULFATE 200 MG PO TABS: 200.0000 mg | ORAL_TABLET | Freq: Every day | ORAL | Status: DC

## 2011-01-13 MED ORDER — HYDROMORPHONE HCL 2 MG PO TABS
2.0000 mg | ORAL_TABLET | Freq: Once | ORAL | Status: AC
  Administered 2011-01-13: 2 mg via ORAL

## 2011-01-13 MED ORDER — METOPROLOL TARTRATE 12.5 MG HALF TABLET
12.5000 mg | ORAL_TABLET | Freq: Once | ORAL | Status: AC
  Administered 2011-01-14: 12.5 mg via ORAL
  Filled 2011-01-13 (×2): qty 1

## 2011-01-13 MED ORDER — HYDROMORPHONE HCL 2 MG PO TABS
4.0000 mg | ORAL_TABLET | ORAL | Status: DC | PRN
  Administered 2011-01-13 – 2011-01-21 (×28): 4 mg via ORAL
  Filled 2011-01-13 (×4): qty 2
  Filled 2011-01-13: qty 1
  Filled 2011-01-13 (×3): qty 2
  Filled 2011-01-13: qty 1
  Filled 2011-01-13 (×3): qty 2
  Filled 2011-01-13 (×2): qty 1
  Filled 2011-01-13 (×4): qty 2
  Filled 2011-01-13: qty 1
  Filled 2011-01-13 (×11): qty 2
  Filled 2011-01-13: qty 1
  Filled 2011-01-13 (×2): qty 2

## 2011-01-13 MED ORDER — LIDOCAINE HCL (PF) 1 % IJ SOLN
INTRAMUSCULAR | Status: AC
  Filled 2011-01-13: qty 30

## 2011-01-13 MED ORDER — SODIUM CHLORIDE 0.9 % IV SOLN
INTRAVENOUS | Status: AC
  Administered 2011-01-14: 70 mL/h via INTRAVENOUS
  Filled 2011-01-13: qty 40

## 2011-01-13 MED ORDER — BISACODYL 5 MG PO TBEC: 5.0000 mg | DELAYED_RELEASE_TABLET | Freq: Once | ORAL | Status: DC

## 2011-01-13 MED ORDER — HYDROXYCHLOROQUINE SULFATE 200 MG PO TABS
200.0000 mg | ORAL_TABLET | Freq: Every day | ORAL | Status: DC
  Administered 2011-01-13: 200 mg via ORAL

## 2011-01-13 MED ORDER — SODIUM CHLORIDE 0.9 % IV SOLN
INTRAVENOUS | Status: AC
  Administered 2011-01-14: 1.1 [IU]/h via INTRAVENOUS
  Filled 2011-01-13: qty 1

## 2011-01-13 MED ORDER — VANCOMYCIN HCL 1000 MG IV SOLR
1250.0000 mg | INTRAVENOUS | Status: AC
  Administered 2011-01-14: 1250 mg via INTRAVENOUS
  Filled 2011-01-13: qty 1250

## 2011-01-13 MED ORDER — DEXTROSE 5 % IV SOLN
1.5000 g | INTRAVENOUS | Status: AC
  Administered 2011-01-14: 1.5 g via INTRAVENOUS
  Filled 2011-01-13: qty 1.5

## 2011-01-13 MED ORDER — TEMAZEPAM 15 MG PO CAPS: 15.0000 mg | ORAL_CAPSULE | Freq: Once | ORAL | Status: DC | PRN

## 2011-01-13 MED ORDER — HYDROXYCHLOROQUINE SULFATE 200 MG PO TABS
400.0000 mg | ORAL_TABLET | Freq: Every day | ORAL | Status: DC
  Administered 2011-01-13: 200 mg via ORAL
  Administered 2011-01-14 – 2011-01-20 (×7): 400 mg via ORAL
  Filled 2011-01-13 (×9): qty 2

## 2011-01-13 NOTE — Progress Notes (Signed)
ANTICOAGULATION CONSULT NOTE - Follow Up Consult  Pharmacy Consult for heparin  Indication: ACS s/p cath with multivessel CAD  Assessment: 71 yo female now s/p cath found to have severe multivessel CAD. Surgery consult pending. Will restart heparin this afternoon.   Goal of Therapy:  Heparin level 0.3-0.7 units/ml   Plan:  Restart heparin at 1000 units/hr Daily CBC and heparin levels  No Known Allergies  Patient Measurements: Height: 5\' 5"  (165.1 cm) Weight: 165 lb 5.5 oz (75 kg) IBW/kg (Calculated) : 57  Adjusted Body Weight:   Vital Signs: Temp: 97.6 F (36.4 C) (01/11 0741) Temp src: Oral (01/11 0741) BP: 130/92 mmHg (01/11 1125) Pulse Rate: 64  (01/11 1139)  Labs:  Basename 01/13/11 0858 01/13/11 0553 01/13/11 0011 01/12/11 1718  HGB -- 13.5 -- 15.5*  HCT -- 40.3 -- 45.7  PLT -- 165 -- 182  APTT -- -- -- 34  LABPROT -- 13.8 -- 12.5  INR -- 1.04 -- 0.91  HEPARINUNFRC -- -- 0.35 --  CREATININE -- 0.84 -- 0.87  CKTOTAL 99 -- 110 138  CKMB 4.8* -- 4.7* 5.3*  TROPONINI <0.30 -- <0.30 <0.30   Estimated Creatinine Clearance: 64.1 ml/min (by C-G formula based on Cr of 0.84).   Medications:  Scheduled:    . aspirin  324 mg Oral NOW   Or  . aspirin  300 mg Rectal NOW  . aspirin  324 mg Oral Pre-Cath  . aspirin EC  81 mg Oral Daily  . calcium-vitamin D  1 tablet Oral Daily  . cholecalciferol  2,000 Units Oral Daily  . diazepam  5 mg Oral On Call  . fentaNYL  50 mcg Transdermal Q72H  . fentaNYL      . Fluticasone-Salmeterol  1 puff Inhalation BID  . gabapentin  600 mg Oral QID  . heparin      . heparin  3,500 Units Intravenous Once  . HYDROmorphone  2 mg Oral Once  . hydroxychloroquine  200 mg Oral QHS  . lidocaine      . metoprolol tartrate  12.5 mg Oral BID  . midazolam      . montelukast  10 mg Oral Daily  . mulitivitamin with minerals  1 tablet Oral Daily  . nitroGLYCERIN      . rosuvastatin  10 mg Oral Daily  . sodium chloride  3 mL Intravenous  Q12H  . DISCONTD: aspirin  324 mg Oral Pre-Cath  . DISCONTD: calcium-vitamin D  1 tablet Oral Daily  . DISCONTD: diazepam  5 mg Oral On Call  . DISCONTD: diazepam  5 mg Oral On Call  . DISCONTD: fluticasone-salmeterol  2 puff Inhalation BID  . DISCONTD: heparin PEDS  3,500 Units Intravenous Once  . DISCONTD: hydroxychloroquine  200 mg Oral Daily  . DISCONTD: sodium chloride  3 mL Intravenous Q12H  . DISCONTD: Vitamin D  2,000 Units Oral Daily     [name] Long 01/13/2011,3:20 PM

## 2011-01-13 NOTE — Brief Op Note (Signed)
01/12/2011 - 01/13/2011  12:42 PM  PATIENT:  [name] Long  71 y.o. female  PRE-OPERATIVE DIAGNOSIS:  [place]   Full note dictated; see diagram.  Severely calcified LM, LAD, LCX and prox RCA. LM:  eccentric 60% LAD:  99% near ostial LCX:  90 - 95% ostial eccentric stenosis RCA: 60 proximal  LV:  Mild anterolateral hypokinesis; EF 50 - 55%  Normal aortoiliac and left subclavian and LIMA.  REC;  CABG  DICTATION # [id number], [id number]  Lennette Bihari, MD, [facility] 01/13/2011 12:42 PM

## 2011-01-13 NOTE — Consult Note (Signed)
[address]            [place] [address]          [phone]       [name] Long [facility] Medical Record #[id number] Date of Birth: 12/21/1940  Referring: No ref. provider found Primary Care: No primary provider on file.  Chief Complaint:    Chief Complaint  Patient presents with  . Chest Pain  . Abnormal ECG    History of Present Illness:    The patient is a 71 year old Caucasian female with unstable angina admitted to the hospital early this morning with mildly elevated cardiac enzymes. She's placed on IV heparin and nitroglycerin with improved symptoms. Her 12-lead EKG showed T-wave changes in the anterior lateral leads. Cardiac catheterization performed by Dr. Tresa Endo demonstrated a 99% proximal LAD stenosis, 80% proximal circumflex stenosis, 60% proximal RCA stenosis``, normal LV function, LVEDP 11 mmHg, no evidence of aortic stenosis or mitral regurgitation. The patient was referred for urgent surgical coronary pressurization.    Current Activity/ Functional Status: Normal functional status for her age. She does have chronic fibromyalgia. She does have arthritis in her neck and was scheduled to have cervical spine surgery at [facility] later this month.    Past Medical History  Diagnosis Date  . Allergy     RHINITIS  . Asthma   . Fibromyalgia   . Fibromyalgia   . Chronic low back pain   . Osteoporosis     OSTEOPENIA  . PPD positive   . Connective tissue disease   . Urinary incontinence   . Hypertension   . Crescendo angina, with EKG changes, new. 01/12/2011  . Abnormal EKG 01/12/2011  . Back pain, chronic--plans were for cervical disc surgery week of 01/16/11 01/12/2011  . Asthma 01/12/2011    Past Surgical History  Procedure Date  . Appendectomy   . Abdominal hysterectomy   . Bladder repair   . Cervical fusion   . Breast surgery     REDUCTION  . Carpal tunnel release     History  Smoking status  . Former Smoker  . Quit  date: 01/12/1980  Smokeless tobacco  . Never Used    History  Alcohol Use No    History   Social History  . Marital Status: Married    Spouse Name: N/A    Number of Children: N/A  . Years of Education: N/A   Occupational History  . Not on file.   Social History Main Topics  . Smoking status: Former Smoker    Quit date: 01/12/1980  . Smokeless tobacco: Never Used  . Alcohol Use: No  . Drug Use: No  . Sexually Active: Yes   Other Topics Concern  . Not on file   Social History Narrative  . No narrative on file    No Known Allergies  Current Facility-Administered Medications  Medication Dose Route Frequency Provider Last Rate Last Dose  . 0.9 %  sodium chloride infusion  250 mL Intravenous PRN Leone Brand, NP      . 0.9 %  sodium chloride infusion   Intravenous Continuous Lennette Bihari, MD 125 mL/hr at 01/13/11 1500    . acetaminophen (TYLENOL) tablet 650 mg  650 mg Oral Q4H PRN Leone Brand, NP   650 mg at 01/12/11 2352  . albuterol (PROVENTIL HFA;VENTOLIN HFA) 108 (90 BASE)  MCG/ACT inhaler 1 puff  1 puff Inhalation Q6H PRN Leone Brand, NP      . albuterol (PROVENTIL HFA;VENTOLIN HFA) 108 (90 BASE) MCG/ACT inhaler 1 puff  1 puff Inhalation Q6H PRN Leone Brand, NP      . ALPRAZolam Prudy Feeler) tablet 0.25-0.5 mg  0.25-0.5 mg Oral Q4H PRN Kathlee Nations Trigt III, MD      . aspirin chewable tablet 324 mg  324 mg Oral NOW Leone Brand, NP   324 mg at 01/13/11 0046   Or  . aspirin suppository 300 mg  300 mg Rectal NOW Leone Brand, NP      . aspirin chewable tablet 324 mg  324 mg Oral Pre-Cath Leone Brand, NP   324 mg at 01/13/11 4098  . aspirin EC tablet 81 mg  81 mg Oral Daily Leone Brand, NP      . calcium-vitamin D (OSCAL WITH D) 500-200 MG-UNIT per tablet 1 tablet  1 tablet Oral Daily Marcelino Scot, PHARMD   1 tablet at 01/13/11 1019  . carisoprodol (SOMA) tablet 350 mg  350 mg Oral QID PRN Leone Brand, NP   350 mg at 01/13/11 1529  .  cholecalciferol (VITAMIN D) tablet 2,000 Units  2,000 Units Oral Daily Marcelino Scot, PHARMD   2,000 Units at 01/13/11 1019  . diazepam (VALIUM) tablet 5 mg  5 mg Oral On Call Lennette Bihari, MD   5 mg at 01/13/11 1128  . fentaNYL (DURAGESIC - dosed mcg/hr) 50 mcg  50 mcg Transdermal Q72H Leone Brand, NP   50 mcg at 01/13/11 1017  . fentaNYL (SUBLIMAZE) 0.05 MG/ML injection           . Fluticasone-Salmeterol (ADVAIR) 250-50 MCG/DOSE inhaler 1 puff  1 puff Inhalation BID Marcelino Scot, PHARMD   1 puff at 01/13/11 1191  . gabapentin (NEURONTIN) tablet 600 mg  600 mg Oral QID Leone Brand, NP   600 mg at 01/13/11 1021  . heparin 2-0.9 UNIT/ML-% infusion           . heparin ADULT infusion 100 units/ml (25000 units/250 ml)  1,000 Units/hr Intravenous Continuous Severiano Gilbert, PHARMD      . heparin bolus via infusion 3,500 Units  3,500 Units Intravenous Once Lennette Bihari, MD   3,500 Units at 01/12/11 1853  . HYDROmorphone (DILAUDID) tablet 2 mg  2 mg Oral Once Abelino Derrick, [place]   2 mg at 01/13/11 0248  . HYDROmorphone (DILAUDID) tablet 4 mg  4 mg Oral Q4H PRN Abelino Derrick, PA   4 mg at 01/13/11 1025  . hydroxychloroquine (PLAQUENIL) tablet 200 mg  200 mg Oral QHS Lennette Bihari, MD      . lidocaine (XYLOCAINE) 1 % injection           . metoprolol tartrate (LOPRESSOR) tablet 12.5 mg  12.5 mg Oral BID Leone Brand, NP   12.5 mg at 01/13/11 1020  . midazolam (VERSED) 2 MG/2ML injection           . montelukast (SINGULAIR) tablet 10 mg  10 mg Oral Daily Leone Brand, NP   10 mg at 01/13/11 1020  . mulitivitamin with minerals tablet 1 tablet  1 tablet Oral Daily Leone Brand, NP   1 tablet at 01/13/11 1020  . nitroGLYCERIN (NITROSTAT) SL tablet 0.4 mg  0.4 mg Sublingual Q5 min PRN Leone Brand, NP      .  nitroGLYCERIN (NTG ON-CALL) 0.2 mg/mL injection           . nitroGLYCERIN 0.2 mg/mL in dextrose 5 % infusion  2-200 mcg/min Intravenous Continuous Lennette Bihari, MD      .  ondansetron [facility]) injection 4 mg  4 mg Intravenous Q6H PRN Leone Brand, NP      . rosuvastatin (CRESTOR) tablet 10 mg  10 mg Oral Daily Leone Brand, NP      . sodium chloride 0.9 % injection 3 mL  3 mL Intravenous Q12H Leone Brand, NP      . sodium chloride 0.9 % injection 3 mL  3 mL Intravenous PRN Leone Brand, NP      . sodium chloride 0.9 % injection 3 mL  3 mL Intravenous PRN Abelino Derrick, PA      . zolpidem (AMBIEN) tablet 5 mg  5 mg Oral QHS PRN Leone Brand, NP      . DISCONTD: 0.9 %  sodium chloride infusion   Intravenous Continuous Leone Brand, NP 10 mL/hr at 01/13/11 0400    . DISCONTD: 0.9 %  sodium chloride infusion   Intravenous Continuous Leone Brand, NP      . DISCONTD: 0.9 %  sodium chloride infusion  1 mL/kg/hr Intravenous Continuous Leone Brand, NP 74 mL/hr at 01/13/11 0700 1.001 mL/kg/hr at 01/13/11 0700  . DISCONTD: 0.9 %  sodium chloride infusion  1 mL/kg/hr Intravenous Continuous Abelino Derrick, [place]      . DISCONTD: aspirin chewable tablet 324 mg  324 mg Oral [address] [place], [place]      . DISCONTD: calcium-vitamin D (OSCAL WITH D) 250-125 MG-UNIT per tablet 1 tablet  1 tablet Oral Daily Leone Brand, NP      . DISCONTD: diazepam (VALIUM) tablet 5 mg  5 mg Oral On Call Leone Brand, NP      . DISCONTD: diazepam (VALIUM) tablet 5 mg  5 mg Oral On Call Abelino Derrick, [place]      . DISCONTD: fluticasone-salmeterol (ADVAIR HFA) 230-21 MCG/ACT inhaler 2 puff  2 puff Inhalation BID Leone Brand, NP      . DISCONTD: heparin ADULT infusion 100 units/ml (25000 units/250 ml)  1,000 Units/hr Intravenous Continuous Lennette Bihari, MD   10 mL/hr at 01/13/11 0700  . DISCONTD: heparin PEDS 100 units/mL bolus via infusion 3,500 Units  3,500 Units Intravenous Once Lennette Bihari, MD      . DISCONTD: HYDROmorphone (DILAUDID) tablet 2 mg  2 mg Oral Q4H PRN Leone Brand, NP   2 mg at 01/13/11 0053  . DISCONTD: hydroxychloroquine (PLAQUENIL) tablet 200 mg  200 mg  Oral Daily Leone Brand, NP   200 mg at 01/13/11 0048  . DISCONTD: nitroGLYCERIN 0.2 mg/mL in dextrose 5 % infusion  5 mcg/min Intravenous Titrated Leone Brand, NP   5 mcg/min at 01/13/11 0300  . DISCONTD: sodium chloride 0.9 % injection 3 mL  3 mL Intravenous Q12H Abelino Derrick, [place]      . DISCONTD: Vitamin D CAPS 2,000 Units  2,000 Units Oral Daily Leone Brand, NP        Prescriptions prior to admission  Medication Sig Dispense Refill  . albuterol (PROVENTIL HFA;VENTOLIN HFA) 108 (90 BASE) MCG/ACT inhaler Inhale 1 puff into the lungs every 6 (six) hours as needed. For shortness of breath      . Calcium Carbonate-Vitamin D (CALCIUM +  D PO) Take 1 tablet by mouth daily.        . carisoprodol (SOMA) 350 MG tablet Take 350 mg by mouth 4 (four) times daily as needed.        . Cholecalciferol (VITAMIN D) 2000 UNITS CAPS Take 2,000 Units by mouth daily.      . fentaNYL (DURAGESIC - DOSED MCG/HR) 50 MCG/HR Place 1 patch onto the skin every 3 (three) days.        . fluticasone-salmeterol (ADVAIR HFA) 230-21 MCG/ACT inhaler Inhale 2 puffs into the lungs 2 (two) times daily.        Marland Kitchen gabapentin (NEURONTIN) 600 MG tablet Take 600 mg by mouth 4 (four) times daily.       Marland Kitchen HYDROmorphone (DILAUDID) 2 MG tablet Take 2 mg by mouth every 4 (four) hours as needed. For pain      . hydroxychloroquine (PLAQUENIL) 200 MG tablet Take 200 mg by mouth daily.       . montelukast (SINGULAIR) 10 MG tablet Take 10 mg by mouth daily.       . Multiple Vitamin (MULITIVITAMIN WITH MINERALS) TABS Take 1 tablet by mouth daily.        Family History  Problem Relation Age of Onset  . Adopted: Yes     Review of Systems:     Cardiac Review of Systems: Y or N  Chest Pain [ yes   ]  Resting SOB [ no  ] Exertional SOB  [ yes ]  Diego Cory  ]   Pedal Edema [no   ]    Palpit no ations [ no ] Syncope  [ no ]   Presyncope [   ]  General Review of Systems: [Y] = yes [  ]=no Constitional: recent weight change [  ];  anorexia [  ]; fatigue [  ]; nausea [  ]; night sweats [  ]; fever [  ]; or chills [  ];                                                                                                                                            Eye : blurred vision [  ]; diplopia [   ]; vision changes [  ];  Amaurosis fugax[  ]; Resp: cough [  ];  wheezing[  ];  hemoptysis[  ]; shortness of breath[  ]; paroxysmal nocturnal dyspnea[  ]; dyspnea on exertion[  ]; or orthopnea[  ];  GI:  gallstones[  ], vomiting[ no ];  dysphagia[  ]; melena[  ];  hematochezia [  ]; heartburn[  ];   Hx of  Colonoscopy[  ]; GU: kidney stones [ no; hematuria[  ];   dysuria [  ];  nocturia[  ];  history of     obstruction Mahler.Beck  ];  Skin: rash, swelling[  ];, hair loss[  ];  peripheral edema[  ];  or itching[  ]; Musculosketetal: myalgias[yes  ];  joint swelling[  ];  joint erythema[  ];  joint pain[  ];  back pain[ yes ];  Heme/Lymph: bruising[  ];  bleeding[  ];  anemia[  ];  Neuro: TIA[  ];  headaches[  ];  stroke[no  ];  vertigo[  ];  seizures[  ];   paresthesias[  ];  difficulty walking[  ];  Psych:depression[  ]; anxiety[  ];  Endocrine: diabetes[no  ];  thyroid dysfunction[  ];  Immunizations: Flu [  ]; Pneumococcal[  ];  Other:  Physical Exam: BP 128/76  Pulse 70  Temp(Src) 98.1 F (36.7 C) (Tympanic)  Resp 11  Ht 5\' 5"  (1.651 m)  Wt 165 lb 5.5 oz (75 kg)  BMI 27.51 kg/m2  SpO2 94%  Exam Middle-aged female in the CCU in no distress HEENT normocephalic pupils equal Chest distant breath sounds no deformity Cardiac sinus rhythm no gallop or murmur Abdomen soft obese nontender without pulsatile mass Extremities no cyanosis or tenderness positive peripheral pulses  No focal weakness    Diagnostic Studies & Laboratory data:Cardiac cath reviewed with Dr. Nicholaus Bloom she has severe proximal LAD and circumflex stenosis with mild to moderate RCA stenosis good LV function chest x-ray shows no active disease carotid  duplex scans and PFTs are pending     Recent Radiology Findings:   Dg Chest Port 1 View  01/12/2011  *RADIOLOGY REPORT*  Clinical Data: Chest and left arm pain with shortness of breath.  PORTABLE CHEST - 1 VIEW  Comparison: 07/19/2009.  Findings: Normal heart size with clear lung fields.  No bony abnormality.  IMPRESSION: No active cardiopulmonary disease.  No change from priors.  Original Report Authenticated By: Elsie Stain, M.D.      Recent Lab Findings: Lab Results  Component Value Date   WBC 7.7 01/13/2011   HGB 13.5 01/13/2011   HCT 40.3 01/13/2011   PLT 165 01/13/2011   GLUCOSE 101* 01/13/2011   CHOL 238* 01/12/2011   TRIG 98 01/12/2011   HDL 110 01/12/2011   LDLCALC 108* 01/12/2011   ALT 17 01/12/2011   AST 26 01/12/2011   NA 140 01/13/2011   K 4.1 01/13/2011   CL 106 01/13/2011   CREATININE 0.84 01/13/2011   BUN 15 01/13/2011   CO2 26 01/13/2011   TSH 0.994 01/12/2011   INR 1.04 01/13/2011   HGBA1C 5.7* 01/12/2011      Assessment / Plan:  The patient has unstable angina with critical coronary anatomy and were prepared for urgent surgical coronary pressurization in the a.m. Surgical targets although the LAD circumflex and RCA. The procedure has been discussed in detail with the patient and family including the benefits and risks and she agrees to proceed.          @me1 @ 01/13/2011 5:06 PM

## 2011-01-13 NOTE — Progress Notes (Addendum)
VASCULAR LAB PRELIMINARY  PRELIMINARY  PRELIMINARY  PRELIMINARY  Pre-op Cardiac Surgery  Carotid Findings: No significant ICA stenosis with antegrade vertebral flow bilaterally.  Upper Extremity Right Left  Brachial Pressures 159 152  Radial Waveforms Tri Tri  Ulnar Waveforms Tri Tri  Palmar Arch (Allen's Test) Waveform remains normal with radial compression and obliterates with ulnar compression Waveform remains normal with radial compression and obliterates with ulnar compression     Lower  Extremity Right Left  Dorsalis Pedis Palpable pulses bilaterally   Anterior Tibial    Posterior Tibial    Ankle/Brachial Indices      Farrel Demark, RDMS 01/13/2011, 4:03 PM

## 2011-01-13 NOTE — Progress Notes (Addendum)
Pt has developed small bruise at right groin site, no hematoma present.  Pt has had to be reminded multiple times to put rt knee down.  Dressing removed and bandaid applied. Eliane Decree, RN.

## 2011-01-13 NOTE — Progress Notes (Signed)
[facility]  Subjective:  No further CP or SOB  Objective: Vital signs in last 24 hours: Temp:  [97.6 F (36.4 C)-98.9 F (37.2 C)] 97.6 F (36.4 C) (01/11 0741) Pulse Rate:  [63-86] 63  (01/11 0700) Resp:  [11-25] 16  (01/11 0700) BP: (123-191)/(65-97) 143/77 mmHg (01/11 0700) SpO2:  [94 %-100 %] 96 % (01/11 0700) Weight:  [73.936 kg (163 lb)-75 kg (165 lb 5.5 oz)] 75 kg (165 lb 5.5 oz) (01/10 2100) Last BM Date: 01/12/11  Intake/Output from previous day: 01/10 0701 - 01/11 0700 In: 489.1 [I.V.:489.1] Out: 700 [Urine:700] Intake/Output this shift:    Medications Current Facility-Administered Medications  Medication Dose Route Frequency Provider Last Rate Last Dose  . 0.9 %  sodium chloride infusion   Intravenous Continuous Leone Brand, NP 10 mL/hr at 01/13/11 0400    . 0.9 %  sodium chloride infusion  250 mL Intravenous PRN Leone Brand, NP      . 0.9 %  sodium chloride infusion  1 mL/kg/hr Intravenous Continuous Leone Brand, NP 74 mL/hr at 01/13/11 0700 1.001 mL/kg/hr at 01/13/11 0700  . acetaminophen (TYLENOL) tablet 650 mg  650 mg Oral Q4H PRN Leone Brand, NP   650 mg at 01/12/11 2352  . albuterol (PROVENTIL HFA;VENTOLIN HFA) 108 (90 BASE) MCG/ACT inhaler 1 puff  1 puff Inhalation Q6H PRN Leone Brand, NP      . albuterol (PROVENTIL HFA;VENTOLIN HFA) 108 (90 BASE) MCG/ACT inhaler 1 puff  1 puff Inhalation Q6H PRN Leone Brand, NP      . aspirin chewable tablet 324 mg  324 mg Oral NOW Leone Brand, NP   324 mg at 01/13/11 0046   Or  . aspirin suppository 300 mg  300 mg Rectal NOW Leone Brand, NP      . aspirin chewable tablet 324 mg  324 mg Oral Pre-Cath Leone Brand, NP   324 mg at 01/13/11 4782  . aspirin EC tablet 81 mg  81 mg Oral Daily Leone Brand, NP      . calcium-vitamin D (OSCAL WITH D) 500-200 MG-UNIT per tablet 1 tablet  1 tablet Oral Daily Marcelino Scot, PHARMD      . carisoprodol (SOMA) tablet 350 mg   350 mg Oral QID PRN Leone Brand, NP   350 mg at 01/13/11 0659  . cholecalciferol (VITAMIN D) tablet 2,000 Units  2,000 Units Oral Daily Marcelino Scot, PHARMD      . diazepam (VALIUM) tablet 5 mg  5 mg Oral On Call Lennette Bihari, MD      . fentaNYL (DURAGESIC - dosed mcg/hr) 50 mcg  50 mcg Transdermal Q72H Leone Brand, NP      . Fluticasone-Salmeterol (ADVAIR) 250-50 MCG/DOSE inhaler 1 puff  1 puff Inhalation BID Marcelino Scot, PHARMD      . gabapentin (NEURONTIN) tablet 600 mg  600 mg Oral QID Leone Brand, NP   600 mg at 01/13/11 0049  . heparin ADULT infusion 100 units/ml (25000 units/250 ml)  1,000 Units/hr Intravenous Continuous Lennette Bihari, MD 10 mL/hr at 01/13/11 0700 10 mL/hr at 01/13/11 0700  . heparin bolus via infusion 3,500 Units  3,500 Units Intravenous Once Lennette Bihari, MD   3,500 Units at 01/12/11 1853  . HYDROmorphone (DILAUDID) tablet 2 mg  2 mg Oral Once Abelino Derrick, [place]   2 mg at 01/13/11 0248  .  HYDROmorphone (DILAUDID) tablet 4 mg  4 mg Oral Q4H PRN Abelino Derrick, PA   4 mg at 01/13/11 1610  . hydroxychloroquine (PLAQUENIL) tablet 200 mg  200 mg Oral Daily Leone Brand, NP   200 mg at 01/13/11 0048  . metoprolol tartrate (LOPRESSOR) tablet 12.5 mg  12.5 mg Oral BID Leone Brand, NP   12.5 mg at 01/13/11 0049  . montelukast (SINGULAIR) tablet 10 mg  10 mg Oral Daily Leone Brand, NP   10 mg at 01/13/11 0048  . mulitivitamin with minerals tablet 1 tablet  1 tablet Oral Daily Leone Brand, NP      . nitroGLYCERIN (NITROSTAT) SL tablet 0.4 mg  0.4 mg Sublingual Q5 min PRN Leone Brand, NP      . nitroGLYCERIN 0.2 mg/mL in dextrose 5 % infusion  5 mcg/min Intravenous Titrated Leone Brand, NP   5 mcg/min at 01/13/11 0300  . ondansetron (ZOFRAN) injection 4 mg  4 mg Intravenous Q6H PRN Leone Brand, NP      . rosuvastatin (CRESTOR) tablet 10 mg  10 mg Oral Daily Leone Brand, NP      . sodium chloride 0.9 % injection 3 mL  3 mL Intravenous Q12H  Leone Brand, NP      . sodium chloride 0.9 % injection 3 mL  3 mL Intravenous PRN Leone Brand, NP      . sodium chloride 0.9 % injection 3 mL  3 mL Intravenous PRN Abelino Derrick, PA      . zolpidem (AMBIEN) tablet 5 mg  5 mg Oral QHS PRN Leone Brand, NP      . DISCONTD: 0.9 %  sodium chloride infusion   Intravenous Continuous Leone Brand, NP      . DISCONTD: 0.9 %  sodium chloride infusion  1 mL/kg/hr Intravenous Continuous Abelino Derrick, [place]      . DISCONTD: aspirin chewable tablet 324 mg  324 mg Oral [address] [place], [place]      . DISCONTD: calcium-vitamin D (OSCAL WITH D) 250-125 MG-UNIT per tablet 1 tablet  1 tablet Oral Daily Leone Brand, NP      . DISCONTD: diazepam (VALIUM) tablet 5 mg  5 mg Oral On Call Leone Brand, NP      . DISCONTD: diazepam (VALIUM) tablet 5 mg  5 mg Oral On Call Abelino Derrick, [place]      . DISCONTD: fluticasone-salmeterol (ADVAIR HFA) 230-21 MCG/ACT inhaler 2 puff  2 puff Inhalation BID Leone Brand, NP      . DISCONTD: heparin PEDS 100 units/mL bolus via infusion 3,500 Units  3,500 Units Intravenous Once Lennette Bihari, MD      . DISCONTD: HYDROmorphone (DILAUDID) tablet 2 mg  2 mg Oral Q4H PRN Leone Brand, NP   2 mg at 01/13/11 0053  . DISCONTD: sodium chloride 0.9 % injection 3 mL  3 mL Intravenous Q12H Abelino Derrick, [place]      . DISCONTD: Vitamin D CAPS 2,000 Units  2,000 Units Oral Daily Leone Brand, NP        PE: General appearance: alert, cooperative and no distress Lungs: clear to auscultation bilaterally Heart: regular rate and rhythm, S1, S2 normal, no murmur, click, rub or gallop Extremities: No LEE Pulses: 2+ and symmetric  Lab Results:   Basename 01/13/11 0553 01/12/11 1718  WBC 7.7 6.6  HGB 13.5 15.5*  HCT 40.3 45.7  PLT 165 182   BMET  Basename 01/13/11 0553 01/12/11 1718  NA 140 138  K 4.1 3.8  CL 106 100  CO2 26 27  GLUCOSE 101* 107*  BUN 15 17  CREATININE 0.84 0.87  CALCIUM 9.1 9.6   PT/INR  Basename  01/13/11 0553 01/12/11 1718  LABPROT 13.8 12.5  INR 1.04 0.91   Cholesterol  Basename 01/12/11 1718  CHOL 238*   Cardiac Enzymes Troponin negative x 2, CKMB 5.3, 4.7   Studies/Results: PORTABLE CHEST - 1 VIEW  Comparison: 07/19/2009.  Findings: Normal heart size with clear lung fields. No bony  abnormality.  IMPRESSION:  No active cardiopulmonary disease. No change from priors.    Assessment/Plan    Principal Problem:  *Crescendo angina, with EKG changes, new. Active Problems:  Back pain, chronic--plans were for cervical disc surgery week of 01/16/11  Asthma  Fibromyalgia  Plan: "Crushing CP" with inverted 'T' waves in V1-4.  Currently pain free.  IV nitro and heparin.  Cholesterol ration looks great HDL 110.  She is supposed to have neck surgery next week.  For cath today.   LOS: 1 day    HAGER,BRYAN W 01/13/2011 8:00 AM     Patient seen and examined. Agree with assessment and plan. Pt had crushing chest pain the night before last.  ECG shows evolutionary anterior T wave inversion suggestive of LAD stenosis.  Currently on iv NTG and heparin.  Plan cath/poss PCI today.   Lennette Bihari, MD, [facility] 01/13/2011 8:52 AM

## 2011-01-13 NOTE — Progress Notes (Signed)
ANTICOAGULATION CONSULT NOTE   Pharmacy Consult for Heparin Indication: chest pain/ACS  No Known Allergies  Vital Signs: Temp: 97.6 F (36.4 C) (01/11 0000) Temp src: Oral (01/11 0000) BP: 141/78 mmHg (01/10 2200) Pulse Rate: 74  (01/10 2200)  Labs:  Basename 01/13/11 0011 01/12/11 1718  HGB -- 15.5*  HCT -- 45.7  PLT -- 182  APTT -- 34  LABPROT -- 12.5  INR -- 0.91  HEPARINUNFRC 0.35 --  CREATININE -- 0.87  CKTOTAL 110 138  CKMB 4.7* 5.3*  TROPONINI <0.30 <0.30   Estimated Creatinine Clearance: 61.9 ml/min (by C-G formula based on Cr of 0.87).  Assessment:  71 yo female with chest pain for Heparin  Goal of Therapy:  Heparin level 0.3-0.7 units/ml   Plan:  Continue Heparin at current rate Follow-up am labs or after cath in am.  Eniyah Eastmond, Gary Fleet 01/13/2011,1:39 AM

## 2011-01-14 ENCOUNTER — Encounter (HOSPITAL_COMMUNITY)
Admission: EM | Disposition: A | Discharge status: Home / Self Care | Payer: Self-pay | Source: Home / Self Care | Attending: Cardiothoracic Surgery

## 2011-01-14 ENCOUNTER — Other Ambulatory Visit: Payer: Self-pay

## 2011-01-14 ENCOUNTER — Inpatient Hospital Stay (HOSPITAL_COMMUNITY): Payer: PRIVATE HEALTH INSURANCE

## 2011-01-14 ENCOUNTER — Inpatient Hospital Stay (HOSPITAL_COMMUNITY): Payer: PRIVATE HEALTH INSURANCE | Admitting: Anesthesiology

## 2011-01-14 ENCOUNTER — Encounter (HOSPITAL_COMMUNITY): Payer: Self-pay | Admitting: Anesthesiology

## 2011-01-14 DIAGNOSIS — I2510 Atherosclerotic heart disease of native coronary artery without angina pectoris: Secondary | ICD-10-CM

## 2011-01-14 HISTORY — PX: CORONARY ARTERY BYPASS GRAFT: SHX141

## 2011-01-14 LAB — POCT I-STAT 3, ART BLOOD GAS (G3+)
Acid-base deficit: 2 mmol/L (ref 0.0–2.0)
Bicarbonate: 25.1 mEq/L — ABNORMAL HIGH (ref 20.0–24.0)
O2 Saturation: 100 %
O2 Saturation: 96 %
O2 Saturation: 96 %
O2 Saturation: 95 %
Patient temperature: 36.1
TCO2: 27 mmol/L (ref 0–100)
pCO2 arterial: 40.5 mmHg (ref 35.0–45.0)
pCO2 arterial: 38.7 mmHg (ref 35.0–45.0)
pCO2 arterial: 46.8 mmHg — ABNORMAL HIGH (ref 35.0–45.0)
pCO2 arterial: 45.7 mmHg — ABNORMAL HIGH (ref 35.0–45.0)
pH, Arterial: 7.412 — ABNORMAL HIGH (ref 7.350–7.400)
pH, Arterial: 7.388 (ref 7.350–7.400)
pH, Arterial: 7.337 — ABNORMAL LOW (ref 7.350–7.400)
pH, Arterial: 7.307 — ABNORMAL LOW (ref 7.350–7.400)
pO2, Arterial: 344 mmHg — ABNORMAL HIGH (ref 80.0–100.0)
pO2, Arterial: 77 mmHg — ABNORMAL LOW (ref 80.0–100.0)
pO2, Arterial: 90 mmHg (ref 80.0–100.0)
pO2, Arterial: 86 mmHg (ref 80.0–100.0)

## 2011-01-14 LAB — POCT I-STAT 4, (NA,K, GLUC, HGB,HCT)
Glucose, Bld: 92 mg/dL (ref 70–99)
Glucose, Bld: 125 mg/dL — ABNORMAL HIGH (ref 70–99)
HCT: 32 % — ABNORMAL LOW (ref 36.0–46.0)
HCT: 24 % — ABNORMAL LOW (ref 36.0–46.0)
HCT: 26 % — ABNORMAL LOW (ref 36.0–46.0)
HCT: 33 % — ABNORMAL LOW (ref 36.0–46.0)
Hemoglobin: 12.2 g/dL (ref 12.0–15.0)
Hemoglobin: 8.2 g/dL — ABNORMAL LOW (ref 12.0–15.0)
Hemoglobin: 8.8 g/dL — ABNORMAL LOW (ref 12.0–15.0)
Hemoglobin: 11.2 g/dL — ABNORMAL LOW (ref 12.0–15.0)
Potassium: 3.9 mEq/L (ref 3.5–5.1)
Potassium: 4.1 mEq/L (ref 3.5–5.1)
Potassium: 4.2 mEq/L (ref 3.5–5.1)
Potassium: 3.8 mEq/L (ref 3.5–5.1)
Sodium: 141 mEq/L (ref 135–145)
Sodium: 140 mEq/L (ref 135–145)
Sodium: 136 mEq/L (ref 135–145)
Sodium: 139 mEq/L (ref 135–145)

## 2011-01-14 LAB — CREATININE, SERUM
Creatinine, Ser: 0.75 mg/dL (ref 0.50–1.10)
GFR calc Af Amer: >90 mL/min (ref >90)
GFR calc non Af Amer: 84 mL/min — ABNORMAL LOW (ref >90)

## 2011-01-14 LAB — BASIC METABOLIC PANEL
BUN: 18 mg/dL (ref 6–23)
CO2: 28 mEq/L (ref 19–32)
Calcium: 8.9 mg/dL (ref 8.4–10.5)
Chloride: 103 mEq/L (ref 96–112)
Creatinine, Ser: 0.92 mg/dL (ref 0.50–1.10)
GFR calc Af Amer: 72 mL/min — ABNORMAL LOW (ref >90)
GFR calc non Af Amer: 62 mL/min — ABNORMAL LOW (ref >90)
Glucose, Bld: 102 mg/dL — ABNORMAL HIGH (ref 70–99)
Potassium: 4.1 mEq/L (ref 3.5–5.1)
Sodium: 139 mEq/L (ref 135–145)

## 2011-01-14 LAB — CBC
HCT: 40.2 % (ref 36.0–46.0)
HCT: 33.1 % — ABNORMAL LOW (ref 36.0–46.0)
HCT: 32.1 % — ABNORMAL LOW (ref 36.0–46.0)
Hemoglobin: 11.2 g/dL — ABNORMAL LOW (ref 12.0–15.0)
Hemoglobin: 10.8 g/dL — ABNORMAL LOW (ref 12.0–15.0)
MCH: 30.4 pg (ref 26.0–34.0)
MCH: 29.8 pg (ref 26.0–34.0)
MCH: 29.8 pg (ref 26.0–34.0)
MCHC: 33.8 g/dL (ref 30.0–36.0)
MCHC: 33.8 g/dL (ref 30.0–36.0)
MCHC: 33.6 g/dL (ref 30.0–36.0)
MCV: 89.7 fL (ref 78.0–100.0)
MCV: 88 fL (ref 78.0–100.0)
MCV: 88.4 fL (ref 78.0–100.0)
Platelets: 170 10*3/uL (ref 150–400)
Platelets: 123 10*3/uL — ABNORMAL LOW (ref 150–400)
Platelets: 134 10*3/uL — ABNORMAL LOW (ref 150–400)
RBC: 3.76 MIL/uL — ABNORMAL LOW (ref 3.87–5.11)
RBC: 3.63 MIL/uL — ABNORMAL LOW (ref 3.87–5.11)
RDW: 12.9 % (ref 11.5–15.5)
RDW: 12.8 % (ref 11.5–15.5)
RDW: 12.7 % (ref 11.5–15.5)
WBC: 6.6 10*3/uL (ref 4.0–10.5)
WBC: 12.1 10*3/uL — ABNORMAL HIGH (ref 4.0–10.5)
WBC: 15.3 10*3/uL — ABNORMAL HIGH (ref 4.0–10.5)

## 2011-01-14 LAB — GLUCOSE, CAPILLARY
Glucose-Capillary: 113 mg/dL — ABNORMAL HIGH (ref 70–99)
Glucose-Capillary: 99 mg/dL (ref 70–99)
Glucose-Capillary: 171 mg/dL — ABNORMAL HIGH (ref 70–99)
Glucose-Capillary: 157 mg/dL — ABNORMAL HIGH (ref 70–99)

## 2011-01-14 LAB — PREPARE RBC (CROSSMATCH)

## 2011-01-14 LAB — TYPE AND SCREEN
ABO/RH(D): O POS
Antibody Screen: NEGATIVE
Unit division: 0
Unit division: 0

## 2011-01-14 LAB — SURGICAL PCR SCREEN
MRSA, PCR: NEGATIVE
Staphylococcus aureus: POSITIVE — AB

## 2011-01-14 LAB — POCT I-STAT, CHEM 8
Chloride: 108 mEq/L (ref 96–112)
HCT: 33 % — ABNORMAL LOW (ref 36.0–46.0)
Potassium: 4.2 mEq/L (ref 3.5–5.1)
Sodium: 138 mEq/L (ref 135–145)

## 2011-01-14 LAB — MAGNESIUM: Magnesium: 2.5 mg/dL (ref 1.5–2.5)

## 2011-01-14 LAB — APTT: aPTT: 37 seconds (ref 24–37)

## 2011-01-14 LAB — PROTIME-INR
INR: 1.19 (ref 0.00–1.49)
Prothrombin Time: 15.4 seconds — ABNORMAL HIGH (ref 11.6–15.2)

## 2011-01-14 LAB — HEMOGLOBIN AND HEMATOCRIT, BLOOD
HCT: 25.3 % — ABNORMAL LOW (ref 36.0–46.0)
Hemoglobin: 8.6 g/dL — ABNORMAL LOW (ref 12.0–15.0)

## 2011-01-14 LAB — PLATELET COUNT: Platelets: 121 10*3/uL — ABNORMAL LOW (ref 150–400)

## 2011-01-14 SURGERY — CORONARY ARTERY BYPASS GRAFTING (CABG)
Anesthesia: General | Site: Chest

## 2011-01-14 SURGERY — CORONARY ARTERY BYPASS GRAFTING (CABG)
Anesthesia: General | Site: Chest | Wound class: Clean

## 2011-01-14 MED ORDER — LACTATED RINGERS IV SOLN
INTRAVENOUS | Status: DC | PRN
  Administered 2011-01-14: 07:00:00 via INTRAVENOUS

## 2011-01-14 MED ORDER — HEMOSTATIC AGENTS (NO CHARGE) OPTIME
TOPICAL | Status: DC | PRN
  Administered 2011-01-14: 1 via TOPICAL

## 2011-01-14 MED ORDER — ASPIRIN EC 325 MG PO TBEC
325.0000 mg | DELAYED_RELEASE_TABLET | Freq: Every day | ORAL | Status: DC
  Administered 2011-01-15 – 2011-01-18 (×4): 325 mg via ORAL
  Filled 2011-01-14 (×4): qty 1

## 2011-01-14 MED ORDER — ASPIRIN 81 MG PO CHEW: 324.0000 mg | CHEWABLE_TABLET | Freq: Every day | ORAL | Status: DC

## 2011-01-14 MED ORDER — DEXTROSE 5 % IV SOLN
0.7500 g | INTRAVENOUS | Status: DC | PRN
  Administered 2011-01-14: .75 g via INTRAVENOUS

## 2011-01-14 MED ORDER — HYDROMORPHONE HCL PF 1 MG/ML IJ SOLN
1.0000 mg | INTRAMUSCULAR | Status: DC | PRN
  Administered 2011-01-14: 1 mg via INTRAVENOUS
  Administered 2011-01-15: 2 mg via INTRAVENOUS
  Administered 2011-01-15 (×5): 1 mg via INTRAVENOUS
  Administered 2011-01-16: 2 mg via INTRAVENOUS
  Filled 2011-01-14 (×2): qty 2
  Filled 2011-01-14: qty 1
  Filled 2011-01-14 (×2): qty 2
  Filled 2011-01-14 (×2): qty 1

## 2011-01-14 MED ORDER — DOPAMINE-DEXTROSE 3.2-5 MG/ML-% IV SOLN
INTRAVENOUS | Status: DC | PRN
  Administered 2011-01-14: 2 ug/kg/min via INTRAVENOUS

## 2011-01-14 MED ORDER — MORPHINE SULFATE 2 MG/ML IJ SOLN: 1.0000 mg | INTRAMUSCULAR | Status: DC | PRN

## 2011-01-14 MED ORDER — ACETAMINOPHEN 160 MG/5ML PO SOLN: 650.0000 mg | ORAL | Status: AC

## 2011-01-14 MED ORDER — SODIUM CHLORIDE 0.9 % IV SOLN
0.4000 ug/kg/h | INTRAVENOUS | Status: DC
  Filled 2011-01-14: qty 2

## 2011-01-14 MED ORDER — SODIUM CHLORIDE 0.9 % IV SOLN
INTRAVENOUS | Status: DC
  Filled 2011-01-14: qty 1

## 2011-01-14 MED ORDER — PHENYLEPHRINE HCL 10 MG/ML IJ SOLN
10.0000 mg | INTRAVENOUS | Status: DC | PRN
  Administered 2011-01-14: 10 ug/min via INTRAVENOUS

## 2011-01-14 MED ORDER — DOPAMINE-DEXTROSE 3.2-5 MG/ML-% IV SOLN: 2.5000 ug/kg/min | INTRAVENOUS | Status: DC

## 2011-01-14 MED ORDER — SODIUM CHLORIDE 0.9 % IV SOLN: 250.0000 mL | INTRAVENOUS | Status: DC

## 2011-01-14 MED ORDER — SODIUM CHLORIDE 0.9 % IJ SOLN
3.0000 mL | Freq: Two times a day (BID) | INTRAMUSCULAR | Status: DC
  Administered 2011-01-15 – 2011-01-16 (×3): 3 mL via INTRAVENOUS

## 2011-01-14 MED ORDER — NITROGLYCERIN IN D5W 200-5 MCG/ML-% IV SOLN: 0.0000 ug/min | INTRAVENOUS | Status: DC

## 2011-01-14 MED ORDER — SODIUM CHLORIDE 0.9 % IV SOLN
0.1000 ug/kg/h | INTRAVENOUS | Status: DC
  Filled 2011-01-14: qty 2

## 2011-01-14 MED ORDER — ACETAMINOPHEN 500 MG PO TABS
1000.0000 mg | ORAL_TABLET | Freq: Four times a day (QID) | ORAL | Status: AC
Start: 2011-01-14 — End: 2011-01-19
  Administered 2011-01-14 – 2011-01-19 (×17): 1000 mg via ORAL
  Filled 2011-01-14 (×20): qty 2

## 2011-01-14 MED ORDER — 0.9 % SODIUM CHLORIDE (POUR BTL) OPTIME
TOPICAL | Status: DC | PRN
  Administered 2011-01-14: 1000 mL

## 2011-01-14 MED ORDER — LACTATED RINGERS IV SOLN
INTRAVENOUS | Status: DC
  Administered 2011-01-14: 14:00:00 via INTRAVENOUS

## 2011-01-14 MED ORDER — ACETAMINOPHEN 160 MG/5ML PO SOLN
975.0000 mg | Freq: Four times a day (QID) | ORAL | Status: DC
  Filled 2011-01-14: qty 40.6

## 2011-01-14 MED ORDER — SODIUM CHLORIDE 0.45 % IV SOLN
INTRAVENOUS | Status: DC
  Administered 2011-01-14: 14:00:00 via INTRAVENOUS

## 2011-01-14 MED ORDER — ALBUMIN HUMAN 5 % IV SOLN
250.0000 mL | INTRAVENOUS | Status: AC | PRN
  Administered 2011-01-14 (×3): 250 mL via INTRAVENOUS
  Filled 2011-01-14: qty 250

## 2011-01-14 MED ORDER — VANCOMYCIN HCL 1000 MG IV SOLR
1000.0000 mg | Freq: Once | INTRAVENOUS | Status: AC
  Administered 2011-01-14: 1000 mg via INTRAVENOUS
  Filled 2011-01-14: qty 1000

## 2011-01-14 MED ORDER — DOCUSATE SODIUM 100 MG PO CAPS
200.0000 mg | ORAL_CAPSULE | Freq: Every day | ORAL | Status: DC
  Administered 2011-01-15 – 2011-01-16 (×2): 200 mg via ORAL
  Filled 2011-01-14 (×2): qty 2

## 2011-01-14 MED ORDER — SODIUM CHLORIDE 0.9 % IV SOLN
5.0000 g | INTRAVENOUS | Status: DC
  Filled 2011-01-14: qty 20

## 2011-01-14 MED ORDER — FAMOTIDINE IN NACL 20-0.9 MG/50ML-% IV SOLN
20.0000 mg | Freq: Two times a day (BID) | INTRAVENOUS | Status: AC
  Administered 2011-01-14: 20 mg via INTRAVENOUS

## 2011-01-14 MED ORDER — HEPARIN SODIUM (PORCINE) 1000 UNIT/ML IJ SOLN
INTRAMUSCULAR | Status: DC | PRN
  Administered 2011-01-14: 3000 [IU] via INTRAVENOUS
  Administered 2011-01-14: 22000 [IU] via INTRAVENOUS

## 2011-01-14 MED ORDER — METOPROLOL TARTRATE 1 MG/ML IV SOLN: 2.5000 mg | INTRAVENOUS | Status: DC | PRN

## 2011-01-14 MED ORDER — METOPROLOL TARTRATE 25 MG/10 ML ORAL SUSPENSION
12.5000 mg | Freq: Two times a day (BID) | ORAL | Status: DC
  Filled 2011-01-14 (×5): qty 5

## 2011-01-14 MED ORDER — SODIUM CHLORIDE 0.9 % IV SOLN: INTRAVENOUS | Status: DC

## 2011-01-14 MED ORDER — POTASSIUM CHLORIDE 10 MEQ/50ML IV SOLN
10.0000 meq | INTRAVENOUS | Status: AC
  Administered 2011-01-14 (×3): 10 meq via INTRAVENOUS

## 2011-01-14 MED ORDER — PROTAMINE SULFATE 10 MG/ML IV SOLN
INTRAVENOUS | Status: DC | PRN
  Administered 2011-01-14: 250 mg via INTRAVENOUS

## 2011-01-14 MED ORDER — SODIUM CHLORIDE 0.9 % IJ SOLN: 3.0000 mL | INTRAMUSCULAR | Status: DC | PRN

## 2011-01-14 MED ORDER — BISACODYL 5 MG PO TBEC
10.0000 mg | DELAYED_RELEASE_TABLET | Freq: Every day | ORAL | Status: DC
  Administered 2011-01-15 – 2011-01-19 (×4): 10 mg via ORAL
  Filled 2011-01-14 (×2): qty 1
  Filled 2011-01-14 (×3): qty 2

## 2011-01-14 MED ORDER — CALCIUM CHLORIDE 10 % IV SOLN
1.0000 g | Freq: Once | INTRAVENOUS | Status: AC | PRN
  Administered 2011-01-14: 1 g via INTRAVENOUS
  Filled 2011-01-14: qty 10

## 2011-01-14 MED ORDER — DEXTROSE 5 % IV SOLN
1.5000 g | Freq: Two times a day (BID) | INTRAVENOUS | Status: AC
  Administered 2011-01-14 – 2011-01-16 (×4): 1.5 g via INTRAVENOUS
  Filled 2011-01-14 (×5): qty 1.5

## 2011-01-14 MED ORDER — PHENYLEPHRINE HCL 10 MG/ML IJ SOLN
0.0000 ug/min | INTRAVENOUS | Status: DC
  Filled 2011-01-14 (×2): qty 2

## 2011-01-14 MED ORDER — VECURONIUM BROMIDE 10 MG IV SOLR
INTRAVENOUS | Status: DC | PRN
  Administered 2011-01-14 (×4): 10 mg via INTRAVENOUS

## 2011-01-14 MED ORDER — SODIUM CHLORIDE 0.9 % IV SOLN
INTRAVENOUS | Status: DC | PRN
  Administered 2011-01-14: 12:00:00 via INTRAVENOUS

## 2011-01-14 MED ORDER — INSULIN REGULAR BOLUS VIA INFUSION
0.0000 [IU] | Freq: Three times a day (TID) | INTRAVENOUS | Status: DC
  Filled 2011-01-14 (×5): qty 10

## 2011-01-14 MED ORDER — INSULIN ASPART 100 UNIT/ML ~~LOC~~ SOLN
0.0000 [IU] | SUBCUTANEOUS | Status: DC
  Administered 2011-01-15 – 2011-01-16 (×8): 2 [IU] via SUBCUTANEOUS

## 2011-01-14 MED ORDER — MAGNESIUM SULFATE 40 MG/ML IJ SOLN
INTRAMUSCULAR | Status: AC
  Administered 2011-01-14: 4 g via INTRAVENOUS
  Filled 2011-01-14: qty 100

## 2011-01-14 MED ORDER — PROPOFOL 10 MG/ML IV EMUL
INTRAVENOUS | Status: DC | PRN
  Administered 2011-01-14: 80 mg via INTRAVENOUS

## 2011-01-14 MED ORDER — MORPHINE SULFATE 4 MG/ML IJ SOLN
2.0000 mg | INTRAMUSCULAR | Status: DC | PRN
  Administered 2011-01-14 (×3): 4 mg via INTRAVENOUS
  Filled 2011-01-14 (×3): qty 1

## 2011-01-14 MED ORDER — ONDANSETRON HCL 4 MG/2ML IJ SOLN
4.0000 mg | Freq: Four times a day (QID) | INTRAMUSCULAR | Status: DC | PRN
  Administered 2011-01-14: 4 mg via INTRAVENOUS
  Filled 2011-01-14: qty 2

## 2011-01-14 MED ORDER — PAPAVERINE HCL 30 MG/ML IJ SOLN
INTRAMUSCULAR | Status: DC | PRN
  Administered 2011-01-14: 60 mg via INTRAVENOUS

## 2011-01-14 MED ORDER — METOPROLOL TARTRATE 12.5 MG HALF TABLET
12.5000 mg | ORAL_TABLET | Freq: Two times a day (BID) | ORAL | Status: DC
  Administered 2011-01-16 – 2011-01-17 (×3): 12.5 mg via ORAL
  Filled 2011-01-14 (×9): qty 1

## 2011-01-14 MED ORDER — ACETAMINOPHEN 650 MG RE SUPP
650.0000 mg | RECTAL | Status: AC
  Administered 2011-01-14: 650 mg via RECTAL

## 2011-01-14 MED ORDER — LACTATED RINGERS IV SOLN: 500.0000 mL | Freq: Once | INTRAVENOUS | Status: AC | PRN

## 2011-01-14 MED ORDER — ROCURONIUM BROMIDE 100 MG/10ML IV SOLN
INTRAVENOUS | Status: DC | PRN
  Administered 2011-01-14 (×2): 50 mg via INTRAVENOUS

## 2011-01-14 MED ORDER — MIDAZOLAM HCL 2 MG/2ML IJ SOLN: 2.0000 mg | INTRAMUSCULAR | Status: DC | PRN

## 2011-01-14 MED ORDER — MIDAZOLAM HCL 5 MG/5ML IJ SOLN
INTRAMUSCULAR | Status: DC | PRN
  Administered 2011-01-14: 4 mg via INTRAVENOUS
  Administered 2011-01-14 (×2): 2 mg via INTRAVENOUS
  Administered 2011-01-14: 4 mg via INTRAVENOUS

## 2011-01-14 MED ORDER — MAGNESIUM SULFATE 40 MG/ML IJ SOLN
4.0000 g | Freq: Once | INTRAMUSCULAR | Status: AC
  Administered 2011-01-14: 4 g via INTRAVENOUS

## 2011-01-14 MED ORDER — INSULIN ASPART 100 UNIT/ML ~~LOC~~ SOLN
0.0000 [IU] | SUBCUTANEOUS | Status: AC
  Administered 2011-01-14: 4 [IU] via SUBCUTANEOUS
  Administered 2011-01-14 – 2011-01-15 (×2): 2 [IU] via SUBCUTANEOUS
  Filled 2011-01-14: qty 3

## 2011-01-14 MED ORDER — PANTOPRAZOLE SODIUM 40 MG PO TBEC
40.0000 mg | DELAYED_RELEASE_TABLET | Freq: Every day | ORAL | Status: DC
  Administered 2011-01-16 – 2011-01-21 (×6): 40 mg via ORAL
  Filled 2011-01-14 (×5): qty 1

## 2011-01-14 MED ORDER — FENTANYL CITRATE 0.05 MG/ML IJ SOLN
INTRAMUSCULAR | Status: DC | PRN
  Administered 2011-01-14: 200 ug via INTRAVENOUS
  Administered 2011-01-14: 450 ug via INTRAVENOUS
  Administered 2011-01-14: 250 ug via INTRAVENOUS
  Administered 2011-01-14: 50 ug via INTRAVENOUS
  Administered 2011-01-14: 300 ug via INTRAVENOUS

## 2011-01-14 MED ORDER — BISACODYL 10 MG RE SUPP: 10.0000 mg | Freq: Every day | RECTAL | Status: DC

## 2011-01-14 SURGICAL SUPPLY — 130 items
ADAPTER CARDIO PERF ANTE/RETRO (ADAPTER) ×3 IMPLANT
APPLIER CLIP 9.375 MED OPEN (MISCELLANEOUS)
APPLIER CLIP 9.375 SM OPEN (CLIP)
ATTRACTOMAT 16X20 MAGNETIC DRP (DRAPES) ×3 IMPLANT
BAG DECANTER FOR FLEXI CONT (MISCELLANEOUS) ×3 IMPLANT
BANDAGE ELASTIC 4 VELCRO ST LF (GAUZE/BANDAGES/DRESSINGS) ×3 IMPLANT
BANDAGE ELASTIC 6 VELCRO ST LF (GAUZE/BANDAGES/DRESSINGS) ×3 IMPLANT
BANDAGE GAUZE ELAST BULKY 4 IN (GAUZE/BANDAGES/DRESSINGS) ×3 IMPLANT
BASKET HEART  (ORDER IN 25'S) (MISCELLANEOUS) ×1
BASKET HEART (ORDER IN 25'S) (MISCELLANEOUS) ×1
BASKET HEART (ORDER IN 25S) (MISCELLANEOUS) ×1 IMPLANT
BLADE SAW STERNAL (BLADE) ×3 IMPLANT
BLADE SURG 11 STRL SS (BLADE) ×3 IMPLANT
BLADE SURG 12 STRL SS (BLADE) ×3 IMPLANT
BLADE SURG ROTATE 9660 (MISCELLANEOUS) IMPLANT
CANISTER SUCTION 2500CC (MISCELLANEOUS) ×3 IMPLANT
CANNULA GUNDRY RCSP 15FR (MISCELLANEOUS) ×3 IMPLANT
CATH CPB KIT VANTRIGT (MISCELLANEOUS) ×3 IMPLANT
CATH ROBINSON RED A/P 18FR (CATHETERS) ×9 IMPLANT
CATH THORACIC 28FR (CATHETERS) IMPLANT
CATH THORACIC 28FR RT ANG (CATHETERS) IMPLANT
CATH THORACIC 36FR (CATHETERS) IMPLANT
CATH THORACIC 36FR RT ANG (CATHETERS) ×6 IMPLANT
CLIP APPLIE 9.375 MED OPEN (MISCELLANEOUS) IMPLANT
CLIP APPLIE 9.375 SM OPEN (CLIP) IMPLANT
CLIP FOGARTY SPRING 6M (CLIP) IMPLANT
CLIP TI MEDIUM 24 (CLIP) IMPLANT
CLIP TI WIDE RED SMALL 24 (CLIP) ×3 IMPLANT
CLOTH BEACON ORANGE TIMEOUT ST (SAFETY) ×3 IMPLANT
CONN Y 3/8X3/8X3/8  BEN (MISCELLANEOUS)
CONN Y 3/8X3/8X3/8 BEN (MISCELLANEOUS) IMPLANT
COVER SURGICAL LIGHT HANDLE (MISCELLANEOUS) ×6 IMPLANT
CRADLE DONUT ADULT HEAD (MISCELLANEOUS) ×3 IMPLANT
DRAIN CHANNEL 32F RND 10.7 FF (WOUND CARE) ×6 IMPLANT
DRAPE CARDIOVASCULAR INCISE (DRAPES) ×2
DRAPE SLUSH MACHINE 52X66 (DRAPES) IMPLANT
DRAPE SLUSH/WARMER DISC (DRAPES) IMPLANT
DRAPE SRG 135X102X78XABS (DRAPES) ×1 IMPLANT
DRSG COVADERM 4X14 (GAUZE/BANDAGES/DRESSINGS) ×3 IMPLANT
ELECT BLADE 4.0 EZ CLEAN MEGAD (MISCELLANEOUS) ×3
ELECT BLADE 6.5 EXT (BLADE) ×3 IMPLANT
ELECT CAUTERY BLADE 6.4 (BLADE) ×3 IMPLANT
ELECT REM PT RETURN 9FT ADLT (ELECTROSURGICAL) ×6
ELECTRODE BLDE 4.0 EZ CLN MEGD (MISCELLANEOUS) ×1 IMPLANT
ELECTRODE REM PT RTRN 9FT ADLT (ELECTROSURGICAL) ×2 IMPLANT
GLOVE BIO SURGEON STRL SZ 6 (GLOVE) ×6 IMPLANT
GLOVE BIO SURGEON STRL SZ 6.5 (GLOVE) ×6 IMPLANT
GLOVE BIO SURGEON STRL SZ7 (GLOVE) IMPLANT
GLOVE BIO SURGEON STRL SZ7.5 (GLOVE) ×9 IMPLANT
GLOVE BIO SURGEONS STRL SZ 6.5 (GLOVE) ×3
GLOVE BIOGEL PI IND STRL 6 (GLOVE) IMPLANT
GLOVE BIOGEL PI IND STRL 6.5 (GLOVE) IMPLANT
GLOVE BIOGEL PI IND STRL 7.0 (GLOVE) ×2 IMPLANT
GLOVE BIOGEL PI INDICATOR 6 (GLOVE)
GLOVE BIOGEL PI INDICATOR 6.5 (GLOVE)
GLOVE BIOGEL PI INDICATOR 7.0 (GLOVE) ×4
GLOVE ECLIPSE 6.5 STRL STRAW (GLOVE) ×3 IMPLANT
GLOVE EUDERMIC 7 POWDERFREE (GLOVE) IMPLANT
GLOVE EXAM NITRILE XS STR PU (GLOVE) ×3 IMPLANT
GLOVE ORTHO TXT STRL SZ7.5 (GLOVE) IMPLANT
GLOVE SURG EUDERMIC 8 LTX PF (GLOVE) ×6 IMPLANT
GOWN PREVENTION PLUS XLARGE (GOWN DISPOSABLE) ×9 IMPLANT
GOWN STRL NON-REIN LRG LVL3 (GOWN DISPOSABLE) ×15 IMPLANT
HEMOSTAT POWDER SURGIFOAM 1G (HEMOSTASIS) ×9 IMPLANT
HEMOSTAT SURGICEL 2X14 (HEMOSTASIS) ×3 IMPLANT
INSERT FOGARTY 61MM (MISCELLANEOUS) IMPLANT
INSERT FOGARTY XLG (MISCELLANEOUS) IMPLANT
KIT BASIN OR (CUSTOM PROCEDURE TRAY) ×3 IMPLANT
KIT ROOM TURNOVER OR (KITS) ×3 IMPLANT
KIT SUCTION CATH 14FR (SUCTIONS) ×3 IMPLANT
KIT VASOVIEW W/TROCAR VH 2000 (KITS) ×3 IMPLANT
LEAD PACING MYOCARDI (MISCELLANEOUS) ×3 IMPLANT
MARKER GRAFT CORONARY BYPASS (MISCELLANEOUS) ×9 IMPLANT
NS IRRIG 1000ML POUR BTL (IV SOLUTION) ×15 IMPLANT
PACK OPEN HEART (CUSTOM PROCEDURE TRAY) ×3 IMPLANT
PAD ARMBOARD 7.5X6 YLW CONV (MISCELLANEOUS) ×6 IMPLANT
PENCIL BUTTON HOLSTER BLD 10FT (ELECTRODE) ×3 IMPLANT
PUNCH AORTIC ROTATE 4.0MM (MISCELLANEOUS) ×3 IMPLANT
PUNCH AORTIC ROTATE 4.5MM 8IN (MISCELLANEOUS) IMPLANT
PUNCH AORTIC ROTATE 5MM 8IN (MISCELLANEOUS) IMPLANT
SET CARDIOPLEGIA MPS 5001102 (MISCELLANEOUS) ×3 IMPLANT
SOLUTION ANTI FOG 6CC (MISCELLANEOUS) IMPLANT
SPONGE GAUZE 4X4 12PLY (GAUZE/BANDAGES/DRESSINGS) ×6 IMPLANT
SPONGE GAUZE 4X4 FOR O.R. (GAUZE/BANDAGES/DRESSINGS) ×6 IMPLANT
SPONGE INTESTINAL PEANUT (DISPOSABLE) IMPLANT
SPONGE LAP 18X18 X RAY DECT (DISPOSABLE) IMPLANT
SPONGE LAP 4X18 X RAY DECT (DISPOSABLE) ×9 IMPLANT
SURGIFLO TRUKIT (HEMOSTASIS) ×3 IMPLANT
SUT BONE WAX W31G (SUTURE) ×3 IMPLANT
SUT MNCRL AB 4-0 PS2 18 (SUTURE) ×3 IMPLANT
SUT PROLENE 3 0 SH DA (SUTURE) IMPLANT
SUT PROLENE 3 0 SH1 36 (SUTURE) IMPLANT
SUT PROLENE 4 0 RB 1 (SUTURE) ×2
SUT PROLENE 4 0 SH DA (SUTURE) ×3 IMPLANT
SUT PROLENE 4-0 RB1 .5 CRCL 36 (SUTURE) ×1 IMPLANT
SUT PROLENE 5 0 C 1 36 (SUTURE) IMPLANT
SUT PROLENE 6 0 C 1 30 (SUTURE) IMPLANT
SUT PROLENE 6 0 CC (SUTURE) ×3 IMPLANT
SUT PROLENE 7 0 BV 1 (SUTURE) IMPLANT
SUT PROLENE 7 0 BV1 MDA (SUTURE) IMPLANT
SUT PROLENE 7 0 DA (SUTURE) IMPLANT
SUT PROLENE 7.0 RB 3 (SUTURE) ×9 IMPLANT
SUT PROLENE 8 0 BV175 6 (SUTURE) IMPLANT
SUT PROLENE BLUE 7 0 (SUTURE) ×6 IMPLANT
SUT PROLENE POLY MONO (SUTURE) IMPLANT
SUT SILK  1 MH (SUTURE)
SUT SILK 1 MH (SUTURE) IMPLANT
SUT SILK 2 0 SH CR/8 (SUTURE) IMPLANT
SUT SILK 3 0 SH CR/8 (SUTURE) IMPLANT
SUT STEEL 6MS V (SUTURE) ×9 IMPLANT
SUT STEEL STERNAL CCS#1 18IN (SUTURE) IMPLANT
SUT STEEL SZ 6 DBL 3X14 BALL (SUTURE) ×3 IMPLANT
SUT VIC AB 1 CTX 18 (SUTURE) IMPLANT
SUT VIC AB 1 CTX 36 (SUTURE) ×4
SUT VIC AB 1 CTX36XBRD ANBCTR (SUTURE) ×2 IMPLANT
SUT VIC AB 2-0 CT1 27 (SUTURE) ×2
SUT VIC AB 2-0 CT1 TAPERPNT 27 (SUTURE) ×1 IMPLANT
SUT VIC AB 2-0 CTX 27 (SUTURE) IMPLANT
SUT VIC AB 3-0 SH 27 (SUTURE)
SUT VIC AB 3-0 SH 27X BRD (SUTURE) IMPLANT
SUT VIC AB 3-0 X1 27 (SUTURE) IMPLANT
SUT VICRYL 4-0 PS2 18IN ABS (SUTURE) IMPLANT
SUTURE E-PAK OPEN HEART (SUTURE) ×3 IMPLANT
SYSTEM SAHARA CHEST DRAIN ATS (WOUND CARE) ×3 IMPLANT
TOWEL OR 17X24 6PK STRL BLUE (TOWEL DISPOSABLE) ×6 IMPLANT
TOWEL OR 17X26 10 PK STRL BLUE (TOWEL DISPOSABLE) ×6 IMPLANT
TRAY FOLEY IC TEMP SENS 16FR (CATHETERS) ×3 IMPLANT
TUBING INSUFFLATION 10FT LAP (TUBING) ×3 IMPLANT
UNDERPAD 30X30 INCONTINENT (UNDERPADS AND DIAPERS) ×3 IMPLANT
WATER STERILE IRR 1000ML POUR (IV SOLUTION) ×6 IMPLANT

## 2011-01-14 NOTE — Progress Notes (Signed)
Patient examined and record reviewed.Hemodynamics stable,labs satisfactory.Patient had stable day.Continue current care Pain under better control with Dilaudid VAN TRIGT III,Paarth Cropper 01/14/2011

## 2011-01-14 NOTE — Preoperative (Signed)
Beta Blockers   Reason not to administer Beta Blockers:Not Applicable 

## 2011-01-14 NOTE — Progress Notes (Signed)
Pt transported down to radiology by wheelchair for pre-CABG PA/LAT on monitor with RN at side; pt tolerated well

## 2011-01-14 NOTE — Transfer of Care (Signed)
Immediate Anesthesia Transfer of Care Note  [name]  Procedure(s) Performed:  CORONARY ARTERY BYPASS GRAFTING (CABG) - CABG x three, using right greater saphenous vein harvested endoscopically  Patient Location: PACU and ICU  Anesthesia Type: General  Level of Consciousness: sedated and unresponsive  Airway & Oxygen Therapy: Patient placed on Ventilator (see vital sign flow sheet for setting)  Post-op Assessment: Post -op Vital signs reviewed and stable  Post vital signs: Reviewed and stable Filed Vitals:   01/14/11 0600  BP: 97/54  Pulse: 63  Temp:   Resp:     Complications: No apparent anesthesia complications

## 2011-01-14 NOTE — Anesthesia Preprocedure Evaluation (Addendum)
Anesthesia Evaluation  Patient identified by MRN, date of birth, ID band Patient awake    Reviewed: Allergy & Precautions, H&P , NPO status , Patient's Chart, lab work & pertinent test results, reviewed documented beta blocker date and time   Airway Mallampati: II TM Distance: >3 FB Neck ROM: full    Dental   Pulmonary neg pulmonary ROS, asthma ,          Cardiovascular hypertension, Pt. on medications and On Medications + angina neg cardio ROS     Neuro/Psych  Neuromuscular disease Negative Psych ROS   GI/Hepatic negative GI ROS, Neg liver ROS,   Endo/Other  Negative Endocrine ROS  Renal/GU negative Renal ROS  Genitourinary negative   Musculoskeletal  (+) Fibromyalgia -  Abdominal   Peds  Hematology negative hematology ROS (+)   Anesthesia Other Findings See surgeon's H&P   Reproductive/Obstetrics negative OB ROS                           Anesthesia Physical Anesthesia Plan  ASA: III  Anesthesia Plan: General   Post-op Pain Management:    Induction: Intravenous  Airway Management Planned: Oral ETT  Additional Equipment: Arterial line, CVP and PA Cath  Intra-op Plan:   Post-operative Plan: Post-operative intubation/ventilation  Informed Consent: I have reviewed the patients History and Physical, chart, labs and discussed the procedure including the risks, benefits and alternatives for the proposed anesthesia with the patient or authorized representative who has indicated his/her understanding and acceptance.   Dental advisory given  Plan Discussed with: CRNA, Anesthesiologist and Surgeon  Anesthesia Plan Comments:         Anesthesia Quick Evaluation

## 2011-01-14 NOTE — Procedures (Signed)
Extubation Procedure Note  Patient Details:   Name: [name] Long DOB: 06-Jun-1940 MRN: [id number]   Airway Documentation:    Pt awake and alert, extubated per protocol. Placed on 3L [place], sats 99%. Positive cuff leak, NIF -20, VC 600, IS 450. HR 88, RR 19, BBS dim   Evaluation  O2 sats: stable throughout and currently acceptable Complications: No apparent complications Patient did tolerate procedure well. Bilateral Breath Sounds: Clear   Yes  Arloa Koh 01/14/2011, 4:52 PM

## 2011-01-14 NOTE — Op Note (Signed)
BRIEF OP NOTE  CABGx3  (lima-lad,svg-om,svg-rca)  Surgeon-- Zenaida Niece Trigt  Assist--R.SCOTT S.A.  No blood products Cardiopulmonary Bypass time 90 min  Anes Gen

## 2011-01-14 NOTE — OR Nursing (Signed)
12:15pm called front desk to inform family off pump, 1st call made to SICU;   2nd call made to SICU @ 12:55pm

## 2011-01-14 NOTE — Anesthesia Postprocedure Evaluation (Signed)
  Anesthesia Post-op Note  [name]  Procedure(s) Performed:  CORONARY ARTERY BYPASS GRAFTING (CABG) - CABG x three, using right greater saphenous vein harvested endoscopically  Patient Location: PACU and SICU  Anesthesia Type: General  Level of Consciousness: unresponsive  Airway and Oxygen Therapy: Patient remains intubated per anesthesia plan  Post-op Pain: none  Post-op Assessment: Post-op Vital signs reviewed, Patient's Cardiovascular Status Stable, Respiratory Function Stable, Patent Airway, No signs of Nausea or vomiting and Pain level controlled  Post-op Vital Signs: Reviewed and stable  Complications: No apparent anesthesia complications

## 2011-01-14 NOTE — Progress Notes (Signed)
Transferred pt down to OR on monitor with OR nurse for scheduled CABG.  Took belongings to 2300 including eye glasses.  Pt advised about valuable policy and pt refused to have glasses sent to safe; report given to anesthesiologist at bedside in OR

## 2011-01-15 ENCOUNTER — Inpatient Hospital Stay (HOSPITAL_COMMUNITY): Payer: PRIVATE HEALTH INSURANCE

## 2011-01-15 LAB — POCT I-STAT, CHEM 8
BUN: 23 mg/dL (ref 6–23)
Calcium, Ion: 1.1 mmol/L — ABNORMAL LOW (ref 1.12–1.32)
Chloride: 102 mEq/L (ref 96–112)
Creatinine, Ser: 1 mg/dL (ref 0.50–1.10)
TCO2: 23 mmol/L (ref 0–100)

## 2011-01-15 LAB — BASIC METABOLIC PANEL
BUN: 17 mg/dL (ref 6–23)
CO2: 24 mEq/L (ref 19–32)
Calcium: 8 mg/dL — ABNORMAL LOW (ref 8.4–10.5)
Chloride: 104 mEq/L (ref 96–112)
Creatinine, Ser: 0.81 mg/dL (ref 0.50–1.10)
GFR calc Af Amer: 84 mL/min — ABNORMAL LOW (ref >90)
GFR calc non Af Amer: 72 mL/min — ABNORMAL LOW (ref >90)
Glucose, Bld: 152 mg/dL — ABNORMAL HIGH (ref 70–99)
Potassium: 4.3 mEq/L (ref 3.5–5.1)
Sodium: 135 mEq/L (ref 135–145)

## 2011-01-15 LAB — CBC
HCT: 29.4 % — ABNORMAL LOW (ref 36.0–46.0)
HCT: 29.7 % — ABNORMAL LOW (ref 36.0–46.0)
Hemoglobin: 9.9 g/dL — ABNORMAL LOW (ref 12.0–15.0)
Hemoglobin: 9.9 g/dL — ABNORMAL LOW (ref 12.0–15.0)
MCH: 29.7 pg (ref 26.0–34.0)
MCH: 29.5 pg (ref 26.0–34.0)
MCHC: 33.7 g/dL (ref 30.0–36.0)
MCHC: 33.3 g/dL (ref 30.0–36.0)
MCV: 88.3 fL (ref 78.0–100.0)
MCV: 88.4 fL (ref 78.0–100.0)
Platelets: 96 10*3/uL — ABNORMAL LOW (ref 150–400)
Platelets: 97 10*3/uL — ABNORMAL LOW (ref 150–400)
RBC: 3.33 MIL/uL — ABNORMAL LOW (ref 3.87–5.11)
RBC: 3.36 MIL/uL — ABNORMAL LOW (ref 3.87–5.11)
RDW: 13.1 % (ref 11.5–15.5)
RDW: 13.3 % (ref 11.5–15.5)
WBC: 11.4 10*3/uL — ABNORMAL HIGH (ref 4.0–10.5)
WBC: 14 10*3/uL — ABNORMAL HIGH (ref 4.0–10.5)

## 2011-01-15 LAB — CREATININE, SERUM
Creatinine, Ser: 0.96 mg/dL (ref 0.50–1.10)
GFR calc Af Amer: 68 mL/min — ABNORMAL LOW (ref >90)
GFR calc non Af Amer: 59 mL/min — ABNORMAL LOW (ref >90)

## 2011-01-15 LAB — GLUCOSE, CAPILLARY
Glucose-Capillary: 154 mg/dL — ABNORMAL HIGH (ref 70–99)
Glucose-Capillary: 137 mg/dL — ABNORMAL HIGH (ref 70–99)

## 2011-01-15 LAB — MAGNESIUM
Magnesium: 2.2 mg/dL (ref 1.5–2.5)
Magnesium: 2.1 mg/dL (ref 1.5–2.5)

## 2011-01-15 MED ORDER — INSULIN ASPART 100 UNIT/ML ~~LOC~~ SOLN: 0.0000 [IU] | SUBCUTANEOUS | Status: DC

## 2011-01-15 MED ORDER — METOCLOPRAMIDE HCL 10 MG/10ML PO SOLN
10.0000 mg | Freq: Three times a day (TID) | ORAL | Status: DC
  Administered 2011-01-15 (×2): 10 mg via ORAL
  Filled 2011-01-15 (×8): qty 10

## 2011-01-15 MED ORDER — FUROSEMIDE 10 MG/ML IJ SOLN
20.0000 mg | Freq: Two times a day (BID) | INTRAMUSCULAR | Status: DC
  Administered 2011-01-15 – 2011-01-16 (×2): 20 mg via INTRAVENOUS
  Filled 2011-01-15 (×4): qty 2

## 2011-01-15 MED ORDER — METOCLOPRAMIDE HCL 10 MG PO TABS
10.0000 mg | ORAL_TABLET | Freq: Three times a day (TID) | ORAL | Status: AC
  Administered 2011-01-16 (×4): 10 mg via ORAL
  Filled 2011-01-15 (×4): qty 1

## 2011-01-15 MED ORDER — BIOTENE DRY MOUTH MT LIQD
15.0000 mL | Freq: Two times a day (BID) | OROMUCOSAL | Status: DC
  Administered 2011-01-15: 15 mL via OROMUCOSAL

## 2011-01-15 NOTE — Progress Notes (Signed)
[facility] Progress Note  Subjective:  Day 1 s/p semi-emergent CABG for severe CAD.   Objective:   Vital Signs in the last 24 hours: Temp:  [96.8 F (36 C)-99.9 F (37.7 C)] 99.7 F (37.6 C) (01/13 0700) Pulse Rate:  [69-99] 96  (01/13 0700) Resp:  [10-23] 10  (01/13 0700) BP: (84-111)/(50-78) 94/59 mmHg (01/13 0700) SpO2:  [95 %-100 %] 96 % (01/13 0812) Arterial Line BP: (90-124)/(46-68) 95/50 mmHg (01/13 0700) FiO2 (%):  [40 %-50 %] 40 % (01/12 1610) Weight:  [75 kg (165 lb 5.5 oz)-78.5 kg (173 lb 1 oz)] 78.5 kg (173 lb 1 oz) (01/13 0430)  Intake/Output from previous day: 01/12 0701 - 01/13 0700 In: 5144.2 [P.O.:620; I.V.:3895.2; Blood:431; NG/GT:20; IV Piggyback:178] Out: 5660 [Urine:3880; Blood:1500; Chest Tube:280]  Scheduled:   . acetaminophen (TYLENOL) oral liquid 160 mg/5 mL  650 mg Per Tube NOW   Or  . acetaminophen  650 mg Rectal NOW  . acetaminophen  1,000 mg Oral Q6H   Or  . acetaminophen (TYLENOL) oral liquid 160 mg/5 mL  975 mg Per Tube Q6H  . antiseptic oral rinse  15 mL Mouth Rinse BID  . aspirin EC  325 mg Oral Daily   Or  . aspirin  324 mg Per Tube Daily  . bisacodyl  10 mg Oral Daily   Or  . bisacodyl  10 mg Rectal Daily  . cefUROXime (ZINACEF)  IV  1.5 g Intravenous Q12H  . dexmedetomidine (PRECEDEX) IV infusion for high rates  0.1-0.7 mcg/kg/hr Intravenous To OR  . docusate sodium  200 mg Oral Daily  . famotidine (PEPCID) IV  20 mg Intravenous Q12H  . fentaNYL  50 mcg Transdermal Q72H  . Fluticasone-Salmeterol  1 puff Inhalation BID  . gabapentin  1,200 mg Oral QHS  . gabapentin  600 mg Oral BID  . heparin-papaverine-plasmalyte irrigation   Irrigation To OR  . hydroxychloroquine  400 mg Oral QHS  . insulin aspart  0-24 Units Subcutaneous Q2H   Followed by  . insulin aspart  0-24 Units Subcutaneous Q4H  . insulin regular  0-10 Units Intravenous TID WC  . magnesium sulfate  4 g Intravenous Once  . metoCLOPramide   10 mg Oral TID WC & HS  . metoprolol tartrate  12.5 mg Oral BID   Or  . metoprolol tartrate  12.5 mg Per Tube BID  . montelukast  10 mg Oral Daily  . nitroGLYCERIN  2-200 mcg/min Intravenous To OR  . pantoprazole  40 mg Oral Q1200  . potassium chloride  10 mEq Intravenous Q1 Hr x 3  . rosuvastatin  10 mg Oral Daily  . sodium chloride  3 mL Intravenous Q12H  . vancomycin (VANCOCIN) IVPB 1000 mg/100 mL central line  1,000 mg Intravenous Once  . DISCONTD: aminocaproic acid (AMICAR) IVPB  5 g Intravenous To OR  . DISCONTD: aspirin EC  81 mg Oral Daily  . DISCONTD: bisacodyl  5 mg Oral Once  . DISCONTD: calcium-vitamin D  1 tablet Oral Daily  . DISCONTD: cefUROXime (ZINACEF)  IV  750 mg Intravenous To OR  . DISCONTD: chlorhexidine  60 mL Topical Once  . DISCONTD: cholecalciferol  2,000 Units Oral Daily  . DISCONTD: dexmedetomidine (PRECEDEX) IV infusion  0.4-1.2 mcg/kg/hr Intravenous To OR  . DISCONTD: DOPamine  2-20 mcg/kg/min Intravenous To OR  . DISCONTD: epinephrine  0.5-20 mcg/min Intravenous To OR  . DISCONTD: insulin aspart  0-24 Units Subcutaneous Q4H  .  DISCONTD: magnesium sulfate  40 mEq Other To OR  . DISCONTD: metoprolol tartrate  12.5 mg Oral BID  . DISCONTD: mulitivitamin with minerals  1 tablet Oral Daily  . DISCONTD: phenylephrine (NEO-SYNEPHRINE) Adult infusion  30-200 mcg/min Intravenous To OR  . DISCONTD: potassium chloride  80 mEq Other To OR  . DISCONTD: sodium chloride  3 mL Intravenous Q12H    Physical Exam:   General appearance: alert, cooperative and no distress Neck: no adenopathy, no carotid bruit, no JVD, supple, symmetrical, trachea midline and thyroid not enlarged, symmetric, no tenderness/mass/nodules Lungs: diminished breath sounds bibasilar Heart: S1, S2 normal;  No rub Abdomen: soft, non-tender; bowel sounds normal; no masses,  no organomegaly Extremities: extremities normal, atraumatic, no cyanosis or edema   Rate: 98  Rhythm: normal sinus  rhythm  Lab Results: Results for orders placed during the hospital encounter of 01/12/11 (from the past 24 hour(s))  POCT I-STAT 4, (NA,K, GLUC, HGB,HCT)     Status: Abnormal   Collection Time   01/14/11 10:20 AM      Component Value Range   Sodium 140  135 - 145 (mEq/L)   Potassium 4.2  3.5 - 5.1 (mEq/L)   Glucose, Bld 98  70 - 99 (mg/dL)   HCT 16.1 (*) 09.6 - 46.0 (%)   Hemoglobin 10.9 (*) 12.0 - 15.0 (g/dL)  POCT I-STAT 3, BLOOD GAS (G3+)     Status: Abnormal   Collection Time   01/14/11 10:36 AM      Component Value Range   pH, Arterial 7.412 (*) 7.350 - 7.400    pCO2 arterial 40.5  35.0 - 45.0 (mmHg)   pO2, Arterial 344.0 (*) 80.0 - 100.0 (mmHg)   Bicarbonate 25.8 (*) 20.0 - 24.0 (mEq/L)   TCO2 27  0 - 100 (mmol/L)   O2 Saturation 100.0     Acid-Base Excess 1.0  0.0 - 2.0 (mmol/L)   Sample type ARTERIAL    POCT I-STAT 4, (NA,K, GLUC, HGB,HCT)     Status: Abnormal   Collection Time   01/14/11 10:39 AM      Component Value Range   Sodium 140  135 - 145 (mEq/L)   Potassium 4.1  3.5 - 5.1 (mEq/L)   Glucose, Bld 92  70 - 99 (mg/dL)   HCT 04.5 (*) 40.9 - 46.0 (%)   Hemoglobin 8.2 (*) 12.0 - 15.0 (g/dL)  HEMOGLOBIN AND HEMATOCRIT, BLOOD     Status: Abnormal   Collection Time   01/14/11 11:25 AM      Component Value Range   Hemoglobin 8.6 (*) 12.0 - 15.0 (g/dL)   HCT 81.1 (*) 91.4 - 46.0 (%)  PLATELET COUNT     Status: Abnormal   Collection Time   01/14/11 11:25 AM      Component Value Range   Platelets 121 (*) 150 - 400 (K/uL)  POCT I-STAT 4, (NA,K, GLUC, HGB,HCT)     Status: Abnormal   Collection Time   01/14/11 11:31 AM      Component Value Range   Sodium 136  135 - 145 (mEq/L)   Potassium 4.6  3.5 - 5.1 (mEq/L)   Glucose, Bld 148 (*) 70 - 99 (mg/dL)   HCT 78.2 (*) 95.6 - 46.0 (%)   Hemoglobin 8.5 (*) 12.0 - 15.0 (g/dL)  POCT I-STAT 4, (NA,K, GLUC, HGB,HCT)     Status: Abnormal   Collection Time   01/14/11 12:16 PM      Component Value Range   Sodium  139  135 - 145  (mEq/L)   Potassium 4.2  3.5 - 5.1 (mEq/L)   Glucose, Bld 125 (*) 70 - 99 (mg/dL)   HCT 69.6 (*) 29.5 - 46.0 (%)   Hemoglobin 8.8 (*) 12.0 - 15.0 (g/dL)  POCT I-STAT 3, BLOOD GAS (G3+)     Status: Abnormal   Collection Time   01/14/11 12:19 PM      Component Value Range   pH, Arterial 7.388  7.350 - 7.400    pCO2 arterial 38.7  35.0 - 45.0 (mmHg)   pO2, Arterial 270.0 (*) 80.0 - 100.0 (mmHg)   Bicarbonate 23.3  20.0 - 24.0 (mEq/L)   TCO2 24  0 - 100 (mmol/L)   O2 Saturation 100.0     Acid-base deficit 2.0  0.0 - 2.0 (mmol/L)   Sample type ARTERIAL    POCT I-STAT 4, (NA,K, GLUC, HGB,HCT)     Status: Abnormal   Collection Time   01/14/11  1:40 PM      Component Value Range   Sodium 139  135 - 145 (mEq/L)   Potassium 3.8  3.5 - 5.1 (mEq/L)   Glucose, Bld 150 (*) 70 - 99 (mg/dL)   HCT 28.4 (*) 13.2 - 46.0 (%)   Hemoglobin 11.2 (*) 12.0 - 15.0 (g/dL)  POCT I-STAT 3, BLOOD GAS (G3+)     Status: Abnormal   Collection Time   01/14/11  1:45 PM      Component Value Range   pH, Arterial 7.414 (*) 7.350 - 7.400    pCO2 arterial 35.6  35.0 - 45.0 (mmHg)   pO2, Arterial 77.0 (*) 80.0 - 100.0 (mmHg)   Bicarbonate 23.0  20.0 - 24.0 (mEq/L)   TCO2 24  0 - 100 (mmol/L)   O2 Saturation 96.0     Acid-base deficit 2.0  0.0 - 2.0 (mmol/L)   Patient temperature 36.1 C     Sample type ARTERIAL    CBC     Status: Abnormal   Collection Time   01/14/11  2:00 PM      Component Value Range   WBC 12.1 (*) 4.0 - 10.5 (K/uL)   RBC 3.76 (*) 3.87 - 5.11 (MIL/uL)   Hemoglobin 11.2 (*) 12.0 - 15.0 (g/dL)   HCT 44.0 (*) 10.2 - 46.0 (%)   MCV 88.0  78.0 - 100.0 (fL)   MCH 29.8  26.0 - 34.0 (pg)   MCHC 33.8  30.0 - 36.0 (g/dL)   RDW 72.5  36.6 - 44.0 (%)   Platelets 123 (*) 150 - 400 (K/uL)  PROTIME-INR     Status: Abnormal   Collection Time   01/14/11  2:00 PM      Component Value Range   Prothrombin Time 15.4 (*) 11.6 - 15.2 (seconds)   INR 1.19  0.00 - 1.49   APTT     Status: Normal   Collection  Time   01/14/11  2:00 PM      Component Value Range   aPTT 37  24 - 37 (seconds)  GLUCOSE, CAPILLARY     Status: Abnormal   Collection Time   01/14/11  3:16 PM      Component Value Range   Glucose-Capillary 118 (*) 70 - 99 (mg/dL)  GLUCOSE, CAPILLARY     Status: Abnormal   Collection Time   01/14/11  4:05 PM      Component Value Range   Glucose-Capillary 113 (*) 70 - 99 (mg/dL)  GLUCOSE, CAPILLARY  Status: Abnormal   Collection Time   01/14/11  4:14 PM      Component Value Range   Glucose-Capillary 111 (*) 70 - 99 (mg/dL)   Comment 1 Notify RN    POCT I-STAT 3, BLOOD GAS (G3+)     Status: Abnormal   Collection Time   01/14/11  4:40 PM      Component Value Range   pH, Arterial 7.337 (*) 7.350 - 7.400    pCO2 arterial 46.8 (*) 35.0 - 45.0 (mmHg)   pO2, Arterial 90.0  80.0 - 100.0 (mmHg)   Bicarbonate 25.1 (*) 20.0 - 24.0 (mEq/L)   TCO2 27  0 - 100 (mmol/L)   O2 Saturation 96.0     Acid-base deficit 1.0  0.0 - 2.0 (mmol/L)   Patient temperature 98.6 F     Collection site RADIAL, ALLEN'S TEST ACCEPTABLE     Drawn by RT     Sample type ARTERIAL    GLUCOSE, CAPILLARY     Status: Normal   Collection Time   01/14/11  5:07 PM      Component Value Range   Glucose-Capillary 99  70 - 99 (mg/dL)  GLUCOSE, CAPILLARY     Status: Abnormal   Collection Time   01/14/11  5:59 PM      Component Value Range   Glucose-Capillary 117 (*) 70 - 99 (mg/dL)  GLUCOSE, CAPILLARY     Status: Abnormal   Collection Time   01/14/11  7:42 PM      Component Value Range   Glucose-Capillary 157 (*) 70 - 99 (mg/dL)  CBC     Status: Abnormal   Collection Time   01/14/11  7:56 PM      Component Value Range   WBC 15.3 (*) 4.0 - 10.5 (K/uL)   RBC 3.63 (*) 3.87 - 5.11 (MIL/uL)   Hemoglobin 10.8 (*) 12.0 - 15.0 (g/dL)   HCT 11.9 (*) 14.7 - 46.0 (%)   MCV 88.4  78.0 - 100.0 (fL)   MCH 29.8  26.0 - 34.0 (pg)   MCHC 33.6  30.0 - 36.0 (g/dL)   RDW 82.9  56.2 - 13.0 (%)   Platelets 134 (*) 150 - 400 (K/uL)   POCT I-STAT 3, BLOOD GAS (G3+)     Status: Abnormal   Collection Time   01/14/11  8:10 PM      Component Value Range   pH, Arterial 7.307 (*) 7.350 - 7.400    pCO2 arterial 45.7 (*) 35.0 - 45.0 (mmHg)   pO2, Arterial 86.0  80.0 - 100.0 (mmHg)   Bicarbonate 22.7  20.0 - 24.0 (mEq/L)   TCO2 24  0 - 100 (mmol/L)   O2 Saturation 95.0     Acid-base deficit 3.0 (*) 0.0 - 2.0 (mmol/L)   Patient temperature 99.7 F     Collection site RADIAL, ALLEN'S TEST ACCEPTABLE     Drawn by RT     Sample type ARTERIAL    CREATININE, SERUM     Status: Abnormal   Collection Time   01/14/11  8:12 PM      Component Value Range   Creatinine, Ser 0.75  0.50 - 1.10 (mg/dL)   GFR calc non Af Amer 84 (*) >90 (mL/min)   GFR calc Af Amer >90  >90 (mL/min)  MAGNESIUM     Status: Normal   Collection Time   01/14/11  8:12 PM      Component Value Range   Magnesium 2.5  1.5 -  2.5 (mg/dL)  POCT I-STAT, CHEM 8     Status: Abnormal   Collection Time   01/14/11  8:18 PM      Component Value Range   Sodium 138  135 - 145 (mEq/L)   Potassium 4.2  3.5 - 5.1 (mEq/L)   Chloride 108  96 - 112 (mEq/L)   BUN 14  6 - 23 (mg/dL)   Creatinine, Ser 1.61  0.50 - 1.10 (mg/dL)   Glucose, Bld 096 (*) 70 - 99 (mg/dL)   Calcium, Ion 0.45  4.09 - 1.32 (mmol/L)   TCO2 23  0 - 100 (mmol/L)   Hemoglobin 11.2 (*) 12.0 - 15.0 (g/dL)   HCT 81.1 (*) 91.4 - 46.0 (%)  GLUCOSE, CAPILLARY     Status: Abnormal   Collection Time   01/14/11 10:05 PM      Component Value Range   Glucose-Capillary 171 (*) 70 - 99 (mg/dL)  GLUCOSE, CAPILLARY     Status: Abnormal   Collection Time   01/14/11 11:29 PM      Component Value Range   Glucose-Capillary 157 (*) 70 - 99 (mg/dL)  GLUCOSE, CAPILLARY     Status: Abnormal   Collection Time   01/15/11  3:21 AM      Component Value Range   Glucose-Capillary 145 (*) 70 - 99 (mg/dL)  CBC     Status: Abnormal   Collection Time   01/15/11  3:26 AM      Component Value Range   WBC 11.4 (*) 4.0 - 10.5 (K/uL)    RBC 3.33 (*) 3.87 - 5.11 (MIL/uL)   Hemoglobin 9.9 (*) 12.0 - 15.0 (g/dL)   HCT 78.2 (*) 95.6 - 46.0 (%)   MCV 88.3  78.0 - 100.0 (fL)   MCH 29.7  26.0 - 34.0 (pg)   MCHC 33.7  30.0 - 36.0 (g/dL)   RDW 21.3  08.6 - 57.8 (%)   Platelets 96 (*) 150 - 400 (K/uL)  BASIC METABOLIC PANEL     Status: Abnormal   Collection Time   01/15/11  3:26 AM      Component Value Range   Sodium 135  135 - 145 (mEq/L)   Potassium 4.3  3.5 - 5.1 (mEq/L)   Chloride 104  96 - 112 (mEq/L)   CO2 24  19 - 32 (mEq/L)   Glucose, Bld 152 (*) 70 - 99 (mg/dL)   BUN 17  6 - 23 (mg/dL)   Creatinine, Ser 4.69  0.50 - 1.10 (mg/dL)   Calcium 8.0 (*) 8.4 - 10.5 (mg/dL)   GFR calc non Af Amer 72 (*) >90 (mL/min)   GFR calc Af Amer 84 (*) >90 (mL/min)  MAGNESIUM     Status: Normal   Collection Time   01/15/11  3:26 AM      Component Value Range   Magnesium 2.2  1.5 - 2.5 (mg/dL)  GLUCOSE, CAPILLARY     Status: Abnormal   Collection Time   01/15/11  7:20 AM      Component Value Range   Glucose-Capillary 154 (*) 70 - 99 (mg/dL)   Comment 1 Notify RN      Basename 01/15/11 0326 01/14/11 2018 01/14/11 0350  NA 135 138 --  K 4.3 4.2 --  CL 104 108 --  CO2 24 -- 28  GLUCOSE 152* 157* --  BUN 17 14 --  CREATININE 0.81 0.80 --    Basename 01/13/11 0858 01/13/11 0011  TROPONINI <0.30 <0.30   Hepatic  Function Panel  Basename 01/12/11 1718  PROT 8.0  ALBUMIN 4.4  AST 26  ALT 17  ALKPHOS 107  BILITOT 0.2*  BILIDIR --  IBILI --    Basename 01/14/11 1400  INR 1.19    Lipid Panel     Component Value Date/Time   CHOL 238* 01/12/2011 1718   TRIG 98 01/12/2011 1718   HDL 110 01/12/2011 1718   CHOLHDL 2.2 01/12/2011 1718   VLDL 20 01/12/2011 1718   LDLCALC 108* 01/12/2011 1718      Imaging:  Dg Chest 2 View  01/14/2011  *RADIOLOGY REPORT*  Clinical Data: Preoperative assessment for CABG, history asthma, hypertension, angina  CHEST - 2 VIEW  Comparison: 01/12/2011  Findings: Normal heart size,  mediastinal contours, and pulmonary vascularity. Lungs clear. No pleural effusion or pneumothorax. Bones unremarkable.  IMPRESSION: No acute abnormalities.  Original Report Authenticated By: Lollie Marrow, M.D.   Dg Chest Port 1 View  01/14/2011  *RADIOLOGY REPORT*  Clinical Data: Status post CABG.  PORTABLE CHEST - 1 VIEW  Comparison: 01/14/2011 at 100 hours.  Findings: 1408 hours.  Endotracheal tube terminates 3.9 cm above carina.  A nasogastric tube extends beyond the  inferior aspect of the film.  Right IJ Swan-Ganz catheter, with tip at proximal right pulmonary artery.  Left-sided chest tube and mediastinal drain.  Median sternotomy. Normal heart size.  Mild left hemidiaphragm elevation. No pleural effusion or pneumothorax.  Mildly low lung volumes with minimal subsegmental atelectasis at the right lung base.  IMPRESSION: Expected appearance after median sternotomy.  Minimal subsegmental atelectasis, without pneumothorax or other acute complication.  Original Report Authenticated By: Consuello Bossier, M.D.    Cardiac Studies:     Assessment/Plan:   Principal Problem:  *Crescendo angina, with EKG changes, new. Active Problems:  Back pain, chronic--plans were for cervical disc surgery week of 01/16/11  Asthma  Fibromyalgia   Day 1 S/P CABG x 3.  Stable hemodynamics. Add ace-I as tolerates. Cancel plan for cervical disc surgery this week, will need to defer for several months.   Lennette Bihari, MD, [facility] 01/15/2011, 8:57 AM

## 2011-01-15 NOTE — Progress Notes (Signed)
1 Day Post-Op Procedure(s) (LRB): CORONARY ARTERY BYPASS GRAFTING (CABG) (N/A) Subjective:                                                                                  [address]            [place] [address]          [phone]      Objective:  C/O incisional pain,NSR,lungs clear,gastric dilatation on CXR Vital signs in last 24 hours: Temp:  [96.8 F (36 C)-99.9 F (37.7 C)] 99.7 F (37.6 C) (01/13 0700) Pulse Rate:  [69-99] 96  (01/13 0700) Cardiac Rhythm:  [-] Normal sinus rhythm (01/13 0400) Resp:  [10-23] 10  (01/13 0700) BP: (84-111)/(50-78) 94/59 mmHg (01/13 0700) SpO2:  [95 %-100 %] 96 % (01/13 0812) Arterial Line BP: (90-124)/(46-68) 95/50 mmHg (01/13 0700) FiO2 (%):  [40 %-50 %] 40 % (01/12 1610) Weight:  [165 lb 5.5 oz (75 kg)-173 lb 1 oz (78.5 kg)] 173 lb 1 oz (78.5 kg) (01/13 0430)  Hemodynamic parameters for last 24 hours: PAP: (19-34)/(6-19) 31/16 mmHg CO:  [4.2 L/min-5.3 L/min] 5.3 L/min CI:  [2.2 L/min/m2-2.9 L/min/m2] 2.8 L/min/m2  Intake/Output from previous day: 01/12 0701 - 01/13 0700 In: 5144.2 [P.O.:620; I.V.:3895.2; Blood:431; NG/GT:20; IV Piggyback:178] Out: 5660 [Urine:3880; Blood:1500; Chest Tube:280] Intake/Output this shift:      Lab Results:  Basename 01/15/11 0326 01/14/11 2018 01/14/11 1956  WBC 11.4* -- 15.3*  HGB 9.9* 11.2* --  HCT 29.4* 33.0* --  PLT 96* -- 134*   BMET:  Basename 01/15/11 0326 01/14/11 2018 01/14/11 0350  NA 135 138 --  K 4.3 4.2 --  CL 104 108 --  CO2 24 -- 28  GLUCOSE 152* 157* --  BUN 17 14 --  CREATININE 0.81 0.80 --  CALCIUM 8.0* -- 8.9    PT/INR:  Basename 01/14/11 1400  LABPROT 15.4*  INR 1.19   ABG    Component Value Date/Time   PHART 7.307* 01/14/2011 2010   HCO3 22.7 01/14/2011 2010   TCO2 23 01/14/2011 2018   ACIDBASEDEF 3.0* 01/14/2011 2010   O2SAT 95.0 01/14/2011 2010   CBG (last 3)   Basename 01/15/11 0720 01/15/11 0321 01/14/11 2329  GLUCAP 154* 145* 157*      Assessment/Plan: S/P Procedure(s) (LRB): CORONARY ARTERY BYPASS GRAFTING (CABG) (N/A) See progression orders Reglan for gastric dilatation   LOS: 3 days    VAN TRIGT III,PETER 01/15/2011

## 2011-01-15 NOTE — Op Note (Signed)
[name] Long, HUYETT NO.:  [id number]  MEDICAL RECORD NO.:  [id number]  LOCATION:  2301                         FACILITY:  [facility]  PHYSICIAN:  [name], M.D.  DATE OF BIRTH:  06/16/1940  DATE OF PROCEDURE:  01/14/2011 DATE OF DISCHARGE:                              OPERATIVE REPORT   OPERATION: 1. Coronary artery bypass grafting x3 (left internal mammary artery to     left internal mammary, saphenous vein graft to obtuse marginal,     saphenous vein graft to right coronary artery). 2. Endoscopic harvest of right leg greater saphenous vein.  PREOPERATIVE DIAGNOSIS:  Severe left main and three-vessel coronary artery disease with unstable angina.  POSTOPERATIVE DIAGNOSIS:  Severe left main and three-vessel coronary artery disease with unstable angina.  SURGEON:  [name], M.D.  ASSISTANT:  Al Corpus, SA.  ANESTHESIA:  General by Dr. Hart Robinsons.  INDICATIONS:  The patient is a 71 year old obese female with unstable angina and cardiac catheterization by Dr. Nicholaus Bloom confirming significant proximal LAD and left main stenosis with moderate proximal RCA stenosis. LVEF was 50%.  Urgent surgical revascularization was recommended.  I discussed the procedure with the patient and her family including the indications, benefits and risks, and she understood and agreed to proceed.  OPERATIVE FINDINGS: 1. Adequate conduit. 2. Heavily calcified vessels, which were diffusely diseased. 3. No blood products required for this operation.  PROCEDURE:  The patient was brought to the operating room and placed supine on the operating table.  General anesthesia was induced.  The chest, abdomen, and legs were prepped with Betadine and draped as a sterile field.  A sternal incision was made as the saphenous vein was harvested endoscopically from the right leg.  The left internal mammary artery was harvested as a pedicle graft and was a 1.2-mm vessel.   The sternal retractor was placed using the deep blades due to the patient's body habitus.  The pericardium was opened and suspended, and heparin was administered.  Pursestrings were placed in the ascending aorta and right atrium, and after the ACT was documented as being therapeutic, the patient was cannulated and placed on cardiopulmonary bypass.  At that time the coronaries were identified for grafting.  The right coronary and circumflex marginal had diffuse disease.  The circumflex marginal was into myocardial.  The vein and mammary artery were prepared for the distals and cardioplegia catheters were placed.  After cooling to 32 degrees, the crossclamp was applied and 850 mL of cold blood cardioplegia was administered with good cardioplegic arrest. Cardioplegia was delivered every 20 minutes or less while the crossclamp was in place.  The distal coronary anastomoses were then performed.  The first distal anastomosis was to the distal RCA.  This was heavily calcified proximally and had some diffuse mild-to-moderate calcification distally. A reverse saphenous vein was sewn end-to-side with running 7-0 Prolene with good flow through the graft.  The second distal anastomosis was to the OM branch of the circumflex.  This had heavy calcification proximally and the anastomosis was placed intramyocardial where the vessel was less diseased.  It was a 1.4-mm vessel.  Reverse saphenous vein was  sewn end-to-side with 7-0 Prolene with good flow through the graft.  Cardioplegia was redosed.  The third distal anastomosis was the distal third of the LAD.  It was a 1.5-mm vessel.  The left IMA pedicle was brought through an opening created in the left lateral pericardium and was brought down onto the LAD and sewn end-to-side with running 8-0 Prolene.  There was excellent flow through the anastomosis after briefly releasing the pedicle bulldog on the mammary artery.  The bulldog was reapplied and the  pedicle secured the epicardium.  Cardioplegia was redosed.  A crossclamp was left in place while 2 proximal anastomoses were performed.  Air was vented with a dose of retrograde warm blood cardioplegia.  The crossclamp was removed and the heart resumed a spontaneous rhythm.  The grafts were widely patent and were hemostatic. The patient was rewarmed to 37 degrees.  Temporary pacer wires were applied.  The ventilator was resumed.  The patient was weaned off bypass without difficulty.  Blood pressure and cardiac output were normal. Protamine was administered without adverse reaction.  The cannulas were removed.  The mediastinum was closed over the aorta.  Two chest tubes were placed to drain the mediastinal and left pleural space.  The patient remained stable.  The sternal wires were placed.  The pectoralis fascia was closed with running #1 Vicryl.  Subcutaneous and skin layers were closed in running Vicryl and sterile dressings were applied.  Total bypass time was 90 minutes.     [name], M.D.     PV/MEDQ  D:  01/14/2011  T:  01/15/2011  Job:  [id number]  cc:   Nicki Guadalajara

## 2011-01-15 NOTE — Cardiovascular Report (Signed)
NAMERACHAL, DVORSKY NO.:  [id number]  MEDICAL RECORD NO.:  [id number]  LOCATION:  2301                         FACILITY:  [facility]  PHYSICIAN:  Nicki Guadalajara, M.D.     DATE OF BIRTH:  01-31-1940  DATE OF PROCEDURE: DATE OF DISCHARGE:                           CARDIAC CATHETERIZATION   PROCEDURE:  Cardiac catheterization.  INDICATIONS:  [name] is a 71 year old female, who is originally from [place], [place].  She was admitted to [facility] yesterday after experiencing severe crushing substernal chest discomfort the night before.  She had seen Dr. Susann Givens.  She was noted to have T- wave inversion anteriorly.  Troponins have been negative.  Her ECG suggested LAD ischemia.  Total cholesterol was 238.  She is now referred for definitive diagnostic cardiac catheterization.  The patient has been on IV nitroglycerin and IV heparin.  PROCEDURE IN DETAIL:  After premedication with Versed 2 mg plus fentanyl 25 mcg, the patient was prepped and draped in the usual fashion.  Her right femoral artery was punctured anteriorly and a 5-French sheath was inserted without difficulty.  Diagnostic catheterization was done utilizing 5-French Judkins for left and right coronary catheters.  The right catheter was used for selective angiography into the left internal mammary artery subclavian system.  A 5-French pigtail catheter was used for left ventriculography as well as distal aortography.  Hemostasis was obtained by direct manual pressure.  HEMODYNAMIC DATA:  Central aortic pressure 137/76.  Left ventricular pressure 137/20.  ANGIOGRAPHIC DATA:  There was extensive coronary artery calcification involving the left main, LAD, ostial circumflex, and also calcification of the proximal right coronary artery.  The left main coronary artery had eccentric 60% calcified stenosis prior to bifurcating into an LAD and left circumflex system.  The LAD had 99% stenosis  just beyond the ostium and up to the takeoff of the first diagonal vessel.  The remainder of the LAD was free of significant disease.  The circumflex vessel had very ostial eccentric 90-95% stenosis, best seen on the LAO view.  The circumflex gave rise to a marginal vessel.  The right coronary artery had calcification proximally with 60% narrowing in the proximal bend.  The left subclavian and internal mammary artery were normal and suitable for CABG surgery.  RAO ventriculography revealed low-normal ejection fraction at 50-55%. There was mild-to-moderate focal anterolateral hypocontractility.  Distal aortography revealed a normal aortoiliac system without evidence for renal artery stenosis.  IMPRESSION: 1. Severe coronary calcification with significant stenoses with 60%     distal calcified left main stenosis, 99% near ostial LAD stenosis,     and 95% ostial circumflex stenosis. 2. 60% proximal stenosis in the right coronary artery. 3. Low-normal left ventricle function with mild-to-moderate focal     anterolateral hypocontractility. 4. Normal aortoiliac system.  RECOMMENDATION:  CABG revascularization surgery.          ______________________________ Nicki Guadalajara, M.D.     TK/MEDQ  D:  01/13/2011  T:  01/14/2011  Job:  [id number]  cc:   Sharlot Gowda, M.D.

## 2011-01-15 NOTE — Progress Notes (Signed)
Patient examined and record reviewed.Hemodynamics stable,labs satisfactory.Patient had stable day.Continue current care.  The patient ambulated 200 feet and the hallway. Pain control is adequate with hydromorphone which she takes at home. She is maintained sinus rhythm. P.m. labs are satisfactory        VAN TRIGT III,PETER 01/15/2011

## 2011-01-16 ENCOUNTER — Inpatient Hospital Stay (HOSPITAL_COMMUNITY): Payer: PRIVATE HEALTH INSURANCE

## 2011-01-16 DIAGNOSIS — Z951 Presence of aortocoronary bypass graft: Secondary | ICD-10-CM

## 2011-01-16 DIAGNOSIS — I2510 Atherosclerotic heart disease of native coronary artery without angina pectoris: Secondary | ICD-10-CM | POA: Diagnosis present

## 2011-01-16 LAB — GLUCOSE, CAPILLARY
Glucose-Capillary: 121 mg/dL — ABNORMAL HIGH (ref 70–99)
Glucose-Capillary: 134 mg/dL — ABNORMAL HIGH (ref 70–99)
Glucose-Capillary: 116 mg/dL — ABNORMAL HIGH (ref 70–99)

## 2011-01-16 LAB — CBC
HCT: 27.9 % — ABNORMAL LOW (ref 36.0–46.0)
Hemoglobin: 9.4 g/dL — ABNORMAL LOW (ref 12.0–15.0)
MCH: 29.8 pg (ref 26.0–34.0)
MCHC: 33.7 g/dL (ref 30.0–36.0)
MCV: 88.6 fL (ref 78.0–100.0)
Platelets: 72 10*3/uL — ABNORMAL LOW (ref 150–400)
RBC: 3.15 MIL/uL — ABNORMAL LOW (ref 3.87–5.11)
RDW: 13.4 % (ref 11.5–15.5)
WBC: 11.2 10*3/uL — ABNORMAL HIGH (ref 4.0–10.5)

## 2011-01-16 LAB — BASIC METABOLIC PANEL
BUN: 19 mg/dL (ref 6–23)
CO2: 25 mEq/L (ref 19–32)
Calcium: 8.3 mg/dL — ABNORMAL LOW (ref 8.4–10.5)
Chloride: 103 mEq/L (ref 96–112)
Creatinine, Ser: 0.95 mg/dL (ref 0.50–1.10)
GFR calc Af Amer: 69 mL/min — ABNORMAL LOW (ref >90)
GFR calc non Af Amer: 60 mL/min — ABNORMAL LOW (ref >90)
Glucose, Bld: 135 mg/dL — ABNORMAL HIGH (ref 70–99)
Potassium: 4.5 mEq/L (ref 3.5–5.1)
Sodium: 136 mEq/L (ref 135–145)

## 2011-01-16 MED ORDER — FUROSEMIDE 10 MG/ML IJ SOLN
20.0000 mg | Freq: Once | INTRAMUSCULAR | Status: AC
  Administered 2011-01-16: 20 mg via INTRAVENOUS
  Filled 2011-01-16: qty 2

## 2011-01-16 MED ORDER — ALPRAZOLAM 0.25 MG PO TABS: 0.2500 mg | ORAL_TABLET | Freq: Four times a day (QID) | ORAL | Status: DC | PRN

## 2011-01-16 MED ORDER — INSULIN ASPART 100 UNIT/ML ~~LOC~~ SOLN
0.0000 [IU] | Freq: Three times a day (TID) | SUBCUTANEOUS | Status: DC
  Administered 2011-01-16 – 2011-01-20 (×6): 2 [IU] via SUBCUTANEOUS

## 2011-01-16 MED ORDER — SIMETHICONE 80 MG PO CHEW
80.0000 mg | CHEWABLE_TABLET | Freq: Four times a day (QID) | ORAL | Status: DC | PRN
  Filled 2011-01-16: qty 1

## 2011-01-16 MED ORDER — FUROSEMIDE 40 MG PO TABS
40.0000 mg | ORAL_TABLET | Freq: Every day | ORAL | Status: AC
  Administered 2011-01-17 – 2011-01-21 (×5): 40 mg via ORAL
  Filled 2011-01-16 (×5): qty 1

## 2011-01-16 MED ORDER — POTASSIUM CHLORIDE CRYS ER 20 MEQ PO TBCR
20.0000 meq | EXTENDED_RELEASE_TABLET | Freq: Every day | ORAL | Status: DC
  Administered 2011-01-16 – 2011-01-21 (×6): 20 meq via ORAL
  Filled 2011-01-16 (×6): qty 1

## 2011-01-16 MED FILL — Potassium Chloride Inj 2 mEq/ML: INTRAVENOUS | Qty: 40 | Status: AC

## 2011-01-16 MED FILL — Magnesium Sulfate Inj 50%: INTRAMUSCULAR | Qty: 10 | Status: AC

## 2011-01-16 NOTE — Progress Notes (Signed)
CARDIAC REHAB PHASE I   PRE:  Rate/Rhythm: 95SR  BP:  Supine: 115/68  Sitting:   Standing:    SaO2: 98%3L  MODE:  Ambulation: 350 ft   POST:  Rate/Rhythem: 101ST  BP:  Supine:   Sitting: 129/71  Standing:    SaO2: 96%3L 1505-1540 Pt walked 350 ft on O2 at 3L with rolling walker and asst x 1. Stopped several times due to SOB. Tolerated well otherwise. To bathroom and then recliner. Son in room. Began answering questions son has about d/c planning.left on 3l. Encouraged I.S.  Duanne Limerick

## 2011-01-16 NOTE — Plan of Care (Signed)
Problem: Phase III Progression Outcomes Goal: Time patient transferred to PCTU/Telemetry POD Outcome: Completed/Met Date Met:  01/16/11 1500

## 2011-01-16 NOTE — Progress Notes (Signed)
Pt transferred to unit 2000.  Report given to [employer], Charity fundraiser.  RN at bedside at time of transfers.  Nurse denied any questions or concerns.

## 2011-01-16 NOTE — Progress Notes (Signed)
[facility]  Subjective: No complaints.    Objective: Vital signs in last 24 hours: Temp:  [98.3 F (36.8 C)-99.3 F (37.4 C)] 98.3 F (36.8 C) (01/14 0722) Pulse Rate:  [87-106] 94  (01/14 0900) Resp:  [7-24] 13  (01/14 0900) BP: (86-107)/(58-72) 92/72 mmHg (01/14 0900) SpO2:  [94 %-100 %] 96 % (01/14 0900) Arterial Line BP: (81-124)/(43-70) 98/43 mmHg (01/14 0200) Weight:  [77.8 kg (171 lb 8.3 oz)] 77.8 kg (171 lb 8.3 oz) (01/14 0515) Last BM Date: 01/12/11  Intake/Output from previous day: 01/13 0701 - 01/14 0700 In: 1620 [P.O.:1200; I.V.:420] Out: 2630 [Urine:2610; Chest Tube:20] Intake/Output this shift: Total I/O In: 4 [IV Piggyback:4] Out: 75 [Urine:75]  Medications Current Facility-Administered Medications  Medication Dose Route Frequency Provider Last Rate Last Dose  . acetaminophen (TYLENOL) tablet 1,000 mg  1,000 mg Oral Q6H Kathlee Nations Trigt III, MD   1,000 mg at 01/16/11 0804   Or  . acetaminophen (TYLENOL) solution 975 mg  975 mg Per Tube Q6H Kathlee Nations Trigt III, MD      . albumin human 5 % solution 250 mL  250 mL Intravenous Q15 min PRN Kathlee Nations Trigt III, MD   250 mL at 01/14/11 1725  . albuterol (PROVENTIL HFA;VENTOLIN HFA) 108 (90 BASE) MCG/ACT inhaler 1 puff  1 puff Inhalation Q6H PRN Leone Brand, NP      . albuterol (PROVENTIL HFA;VENTOLIN HFA) 108 (90 BASE) MCG/ACT inhaler 1 puff  1 puff Inhalation Q6H PRN Leone Brand, NP      . aspirin EC tablet 325 mg  325 mg Oral Daily Kathlee Nations Trigt III, MD   325 mg at 01/15/11 1006   Or  . aspirin chewable tablet 324 mg  324 mg Per Tube Daily Kathlee Nations Trigt III, MD      . bisacodyl (DULCOLAX) EC tablet 10 mg  10 mg Oral Daily Kathlee Nations Trigt III, MD   10 mg at 01/15/11 1007   Or  . bisacodyl (DULCOLAX) suppository 10 mg  10 mg Rectal Daily Kathlee Nations Trigt III, MD      . carisoprodol (SOMA) tablet 350 mg  350 mg Oral QID PRN Leone Brand, NP   350 mg at 01/15/11 2030  .  cefUROXime (ZINACEF) 1.5 g in dextrose 5 % 50 mL IVPB  1.5 g Intravenous Q12H Kathlee Nations Trigt III, MD   1.5 g at 01/15/11 2200  . docusate sodium (COLACE) capsule 200 mg  200 mg Oral Daily Kathlee Nations Trigt III, MD   200 mg at 01/15/11 1007  . famotidine (PEPCID) IVPB 20 mg  20 mg Intravenous Q12H Kathlee Nations Trigt III, MD   20 mg at 01/14/11 1415  . fentaNYL (DURAGESIC - dosed mcg/hr) 50 mcg  50 mcg Transdermal Q72H Leone Brand, NP   50 mcg at 01/16/11 0000  . Fluticasone-Salmeterol (ADVAIR) 250-50 MCG/DOSE inhaler 1 puff  1 puff Inhalation BID Marcelino Scot, PHARMD   1 puff at 01/16/11 0740  . furosemide (LASIX) injection 20 mg  20 mg Intravenous BID Kathlee Nations Trigt III, MD   20 mg at 01/16/11 7829  . gabapentin (NEURONTIN) capsule 1,200 mg  1,200 mg Oral QHS Piedad Climes Harding   1,200 mg at 01/15/11 2200  . gabapentin (NEURONTIN) tablet 600 mg  600 mg Oral BID Piedad Climes Harding   600 mg at 01/16/11 0805  . HYDROmorphone (DILAUDID) injection 1-2 mg  1-2 mg  Intravenous Q3H PRN Kathlee Nations Trigt III, MD   2 mg at 01/16/11 0400  . HYDROmorphone (DILAUDID) tablet 4 mg  4 mg Oral Q4H PRN Abelino Derrick, PA   4 mg at 01/16/11 0130  . hydroxychloroquine (PLAQUENIL) tablet 400 mg  400 mg Oral QHS Piedad Climes Harding   400 mg at 01/15/11 2200  . insulin aspart (novoLOG) injection 0-24 Units  0-24 Units Subcutaneous Q4H [name] III, MD   2 Units at 01/16/11 [phone]  . metoCLOPramide (REGLAN) tablet 10 mg  10 mg Oral TID AC & HS Lora Poteet Seay, PHARMD   10 mg at 01/16/11 0803  . metoprolol (LOPRESSOR) injection 2.5-5 mg  2.5-5 mg Intravenous Q2H PRN Kathlee Nations Trigt III, MD      . metoprolol tartrate (LOPRESSOR) tablet 12.5 mg  12.5 mg Oral BID Kathlee Nations Trigt III, MD       Or  . metoprolol tartrate (LOPRESSOR) 25 mg/10 mL oral suspension 12.5 mg  12.5 mg Per Tube BID Kathlee Nations Trigt III, MD      . montelukast (SINGULAIR) tablet 10 mg  10 mg Oral Daily Leone Brand, NP   10 mg at 01/15/11 1012  . ondansetron  (ZOFRAN) injection 4 mg  4 mg Intravenous Q6H PRN Kathlee Nations Trigt III, MD   4 mg at 01/14/11 1615  . pantoprazole (PROTONIX) EC tablet 40 mg  40 mg Oral Q1200 Kathlee Nations Trigt III, MD      . phenylephrine (NEO-SYNEPHRINE) 20,000 mcg in dextrose 5 % 250 mL infusion  0-100 mcg/min Intravenous Continuous Kathlee Nations Trigt III, MD   5 mcg/min at 01/15/11 1700  . rosuvastatin (CRESTOR) tablet 10 mg  10 mg Oral Daily Leone Brand, NP   10 mg at 01/15/11 1006  . simethicone (MYLICON) chewable tablet 80 mg  80 mg Oral QID PRN Kathlee Nations Trigt III, MD      . sodium chloride 0.9 % injection 3 mL  3 mL Intravenous Q12H Kathlee Nations Trigt III, MD   3 mL at 01/15/11 2200  . sodium chloride 0.9 % injection 3 mL  3 mL Intravenous PRN Kathlee Nations Trigt III, MD      . DISCONTD: antiseptic oral rinse (BIOTENE) solution 15 mL  15 mL Mouth Rinse BID Kathlee Nations Trigt III, MD   15 mL at 01/15/11 0800  . DISCONTD: insulin regular bolus via infusion 0-10 Units  0-10 Units Intravenous TID WC [name] III, MD      . DISCONTD: metoCLOPramide (REGLAN) 10 MG/10ML solution 10 mg  10 mg Oral TID WC & HS [name] III, MD   10 mg at 01/15/11 2200  . DISCONTD: midazolam (VERSED) injection 2 mg  2 mg Intravenous Q1H PRN [name] III, MD        PE: General appearance: alert, cooperative and no distress Lungs: decreased BS bilaterally.  greater on the right. Heart: regular rate and rhythm, S1, S2 normal, no murmur, click, rub or gallop Extremities: No LEE Pulses: 2+ and symmetric DPs  Lab Results:   Basename 01/16/11 0330 01/15/11 1718 01/15/11 1715 01/15/11 0326  WBC 11.2* -- 14.0* 11.4*  HGB 9.4* 9.9* 9.9* --  HCT 27.9* 29.0* 29.7* --  PLT 72* -- 97* 96*   BMET  Basename 01/16/11 0330 01/15/11 1718 01/15/11 1715 01/15/11 0326 01/14/11 0350  NA 136 134* -- 135 --  K 4.5 4.1 -- 4.3 --  CL 103  102 -- 104 --  CO2 25 -- -- 24 28  GLUCOSE 135* 140* -- 152* --  BUN 19 23 -- 17 --  CREATININE 0.95 1.00 0.96  -- --  CALCIUM 8.3* -- -- 8.0* 8.9   PT/INR  Basename 01/14/11 1400  LABPROT 15.4*  INR 1.19    Studies/Results: PORTABLE CHEST - 1 VIEW  Comparison: 01/15/2011  Findings: Cardiomediastinal silhouette is stable. Again noted  status post CABG. The right Swan-Ganz catheter has been removed.  The left chest tube has been removed. There is no diagnostic  pneumothorax. Linear atelectasis noted in lingula. Small right  pleural effusion with right basilar atelectasis. No pulmonary  edema. There is a right IJ sheath with tip in SVC. Again noted  gaseous distention of the stomach.  IMPRESSION:  Right Swan-Ganz catheter has been removed. Status post CABG. No  diagnostic pneumothorax. Left chest tube has been removed. Small  right pleural effusion with right basilar atelectasis. No  pulmonary edema.    Assessment/Plan  Principal Problem:  *S/P CABG x 3: (lima-lad,svg-om,svg-rca) Active Problems:  CAD (coronary artery disease): multivessel  Crescendo angina, with EKG changes, new.  Back pain, chronic--plans were for cervical disc surgery week of 01/16/11  Asthma  Fibromyalgia  Plan: POD #2 CABG x3-(lima-lad,svg-om,svg-rca).  Bp soft (87/57) RN stated she just gave some lasix.  Otherwise BP stable.  Hgb stable.  Ambulating well without difficulty.  Transferring to 2000.   LOS: 4 days    HAGER,BRYAN W 01/16/2011 9:38 AM     Patient seen and examined. Agree with assessment and plan. Pt feels well. Stable hemodynamics.  Diuresing with Lasix. For TX to 2000 today.   Lennette Bihari, MD, [facility] 01/16/2011 9:48 AM

## 2011-01-17 ENCOUNTER — Inpatient Hospital Stay (HOSPITAL_COMMUNITY): Payer: PRIVATE HEALTH INSURANCE

## 2011-01-17 ENCOUNTER — Encounter (HOSPITAL_COMMUNITY): Payer: Self-pay | Admitting: Cardiothoracic Surgery

## 2011-01-17 DIAGNOSIS — E1165 Type 2 diabetes mellitus with hyperglycemia: Secondary | ICD-10-CM

## 2011-01-17 DIAGNOSIS — IMO0001 Reserved for inherently not codable concepts without codable children: Secondary | ICD-10-CM

## 2011-01-17 LAB — CBC
HCT: 26.7 % — ABNORMAL LOW (ref 36.0–46.0)
Hemoglobin: 8.8 g/dL — ABNORMAL LOW (ref 12.0–15.0)
MCH: 29.4 pg (ref 26.0–34.0)
MCHC: 33 g/dL (ref 30.0–36.0)
MCV: 89.3 fL (ref 78.0–100.0)
Platelets: 87 10*3/uL — ABNORMAL LOW (ref 150–400)
RBC: 2.99 MIL/uL — ABNORMAL LOW (ref 3.87–5.11)
RDW: 13.2 % (ref 11.5–15.5)
WBC: 10 10*3/uL (ref 4.0–10.5)

## 2011-01-17 LAB — BASIC METABOLIC PANEL
BUN: 26 mg/dL — ABNORMAL HIGH (ref 6–23)
CO2: 23 mEq/L (ref 19–32)
Calcium: 8.3 mg/dL — ABNORMAL LOW (ref 8.4–10.5)
Chloride: 100 mEq/L (ref 96–112)
Creatinine, Ser: 0.85 mg/dL (ref 0.50–1.10)
GFR calc Af Amer: 79 mL/min — ABNORMAL LOW (ref >90)
GFR calc non Af Amer: 68 mL/min — ABNORMAL LOW (ref >90)
Glucose, Bld: 170 mg/dL — ABNORMAL HIGH (ref 70–99)
Potassium: 4 mEq/L (ref 3.5–5.1)
Sodium: 135 mEq/L (ref 135–145)

## 2011-01-17 LAB — GLUCOSE, CAPILLARY: Glucose-Capillary: 141 mg/dL — ABNORMAL HIGH (ref 70–99)

## 2011-01-17 MED ORDER — DIPHENHYDRAMINE HCL 25 MG PO CAPS: 25.0000 mg | ORAL_CAPSULE | Freq: Every evening | ORAL | Status: DC | PRN

## 2011-01-17 MED ORDER — ZOLPIDEM TARTRATE 5 MG PO TABS
5.0000 mg | ORAL_TABLET | Freq: Every evening | ORAL | Status: DC | PRN
  Administered 2011-01-17 – 2011-01-20 (×4): 5 mg via ORAL
  Filled 2011-01-17 (×4): qty 1

## 2011-01-17 MED ORDER — POLYSACCHARIDE IRON 150 MG PO CAPS
150.0000 mg | ORAL_CAPSULE | Freq: Every day | ORAL | Status: DC
  Administered 2011-01-17 – 2011-01-21 (×5): 150 mg via ORAL
  Filled 2011-01-17 (×5): qty 1

## 2011-01-17 MED ORDER — FOLIC ACID 1 MG PO TABS
1.0000 mg | ORAL_TABLET | Freq: Every day | ORAL | Status: DC
  Administered 2011-01-17 – 2011-01-21 (×5): 1 mg via ORAL
  Filled 2011-01-17 (×5): qty 1

## 2011-01-17 MED FILL — Sodium Chloride Irrigation Soln 0.9%: Qty: 3000 | Status: AC

## 2011-01-17 MED FILL — Electrolyte-R (PH 7.4) Solution: INTRAVENOUS | Qty: 4000 | Status: AC

## 2011-01-17 MED FILL — Lidocaine HCl IV Inj 20 MG/ML: INTRAVENOUS | Qty: 5 | Status: AC

## 2011-01-17 MED FILL — Sodium Chloride IV Soln 0.9%: INTRAVENOUS | Qty: 1000 | Status: AC

## 2011-01-17 MED FILL — Mannitol IV Soln 20%: INTRAVENOUS | Qty: 500 | Status: AC

## 2011-01-17 MED FILL — Sodium Bicarbonate IV Soln 8.4%: INTRAVENOUS | Qty: 50 | Status: AC

## 2011-01-17 MED FILL — Heparin Sodium (Porcine) Inj 1000 Unit/ML: INTRAMUSCULAR | Qty: 40 | Status: AC

## 2011-01-17 NOTE — Progress Notes (Signed)
Pt with 5 beats of vtach this am with PVCs pt in NSR at 90, Doree Fudge [facility] on floor and made aware, will monitor patient. Riane Rung, Randall An RN

## 2011-01-17 NOTE — Progress Notes (Signed)
Pt with numbness to upper lip and side of nose this am, resolved after a few minutes.  Will monitor patient. Jream Broyles, Randall An RN

## 2011-01-17 NOTE — Progress Notes (Addendum)
3 Days Post-Op Procedure(s) (LRB): CORONARY ARTERY BYPASS GRAFTING (CABG) (N/A)  Subjective: Patient with urinary retention and required foley reinsertion. She also has complaints of not being able to sleep.  Objective: Vital signs in last 24 hours: Patient Vitals for the past 24 hrs:  BP Temp Temp src Pulse Resp SpO2 Weight  01/17/11 0517 111/70 mmHg 97.6 F (36.4 C) Oral 70  20  97 % -  01/17/11 0456 - - - - - - 171 lb 8.3 oz (77.8 kg)  01/16/11 2143 109/75 mmHg 97.7 F (36.5 C) Oral 98  20  96 % -  01/16/11 2131 - - - - - 97 % -  01/16/11 1700 108/62 mmHg - - 97  - - -  01/16/11 1441 94/71 mmHg - - 100  18  98 % -  01/16/11 1300 100/66 mmHg - - 97  18  92 % -  01/16/11 1202 - 97.8 F (36.6 C) Oral - - - -  01/16/11 1200 105/62 mmHg - - 85  12  95 % -  01/16/11 1100 90/46 mmHg - - 89  11  98 % -  01/16/11 1000 99/53 mmHg - - 91  17  94 % -  01/16/11 0900 92/72 mmHg - - 94  13  96 % -  01/16/11 0800 98/71 mmHg - - 95  14  95 % -  01/16/11 0741 - - - - - 98 % -   Pre op weight 75 kg Current Weight  01/17/11 171 lb 8.3 oz (77.8 kg)      Intake/Output from previous day: 01/14 0701 - 01/15 0700 In: 1114 [P.O.:960; IV Piggyback:154] Out: 1447 [Urine:1446; Stool:1]   Physical Exam:  Cardiovascular: RRR, no murmurs, gallops, or rubs. Pulmonary: Slightly diminished at bases;no rales, wheezes, or rhonchi. Abdomen: Soft, non tender, bowel sounds present. Extremities: Mild bilateral lower extremity edema. Wounds: Clean and dry.  No erythema or signs of infection.  Lab Results: CBC: Basename 01/17/11 0556 01/16/11 0330  WBC 10.0 11.2*  HGB 8.8* 9.4*  HCT 26.7* 27.9*  PLT 87* 72*   BMET:  Basename 01/17/11 0556 01/16/11 0330  NA 135 136  K 4.0 4.5  CL 100 103  CO2 23 25  GLUCOSE 170* 135*  BUN 26* 19  CREATININE 0.85 0.95  CALCIUM 8.3* 8.3*    PT/INR:  Basename 01/14/11 1400  LABPROT 15.4*  INR 1.19   ABG:  INR: Will add last result for INR, ABG once  components are confirmed Will add last 4 CBG results once components are confirmed  Assessment/Plan:  1. CV - SR. Continue Lopressor 12.5 bid. 2.  Pulmonary - CXR this am shows bibasilar atelectasis,small right pleural effusion.Encourage incentive spirometer and wean O2. 3. Volume Overload - Continue with diuresis. 4.  Acute blood loss anemia - H/H slightly decreased to 8.8/26.7.Start Nu Iron and folic acid. 5.Thrombocytopenia-Platelets slightly increased to 87,000. 6.Urinary retention-foley re inserted last evening. 7.Continue with CRPI. 8.Benadryl prn sleep.   ZIMMERMAN,DONIELLE MPA-C 01/17/2011   patient examined and medical record reviewed,agree with above note. VAN TRIGT III,PETER 01/17/2011

## 2011-01-17 NOTE — Progress Notes (Signed)
Pt ambulated in hallway with walker and family approximatly 480 feet. Pt tolerated well will monitor patient. Maciel Kegg, Randall An RN

## 2011-01-17 NOTE — Progress Notes (Signed)
CARDIAC REHAB PHASE I   PRE:  Rate/Rhythm: 87 SR    BP: sitting 99/57    SaO2: 97 3L RA  MODE:  Ambulation: 530 ft   POST:  Rate/Rhythm: 100 ST with PACs    BP: sitting 116/60     SaO2: 94 RA after several minutes  Tolerated well with RW and on RA. SOB x2 with rest and recovery. Feels good. Attempted to get RA SaO2 while walking but would not register. SaO2 good on RA after few minutes of rest (slow to register). Left on RA in chair. Requests her name be sent to [place] [facility]. Will send. [phone]  Harriet Masson CES, ACSM

## 2011-01-17 NOTE — Progress Notes (Signed)
Pt has no urine output for almost 9 hours,pt is complaining of burning sensation in bladder,tried 3 times to urinate but unsuccessful,bladder scan perform and obtained 945 ml of residual volume.Dr. Dorris Fetch made aware with orders made to put  foley catheter,foley cath inserted and obtain 750 urine output,pt feels better as claimed.will continue to monitor. Grainne Knights Jamey Reas RN.

## 2011-01-17 NOTE — Progress Notes (Signed)
[facility]  Subjective: The pt reported a problem with urinary retention and required foley.  She has had a problem with this twice in the past.  Objective: Vital signs in last 24 hours: Temp:  [97.6 F (36.4 C)-97.8 F (36.6 C)] 97.6 F (36.4 C) (01/15 0517) Pulse Rate:  [70-100] 70  (01/15 0517) Resp:  [11-20] 20  (01/15 0517) BP: (90-111)/(46-75) 111/70 mmHg (01/15 0517) SpO2:  [92 %-98 %] 97 % (01/15 0517) Weight:  [77.8 kg (171 lb 8.3 oz)] 77.8 kg (171 lb 8.3 oz) (01/15 0456) Last BM Date: 01/16/11  Intake/Output from previous day: 01/14 0701 - 01/15 0700 In: 1114 [P.O.:960; IV Piggyback:154] Out: 1447 [Urine:1446; Stool:1] Intake/Output this shift:    Medications Current Facility-Administered Medications  Medication Dose Route Frequency Provider Last Rate Last Dose  . acetaminophen (TYLENOL) tablet 1,000 mg  1,000 mg Oral Q6H Kathlee Nations Trigt III, MD   1,000 mg at 01/17/11 0141  . albuterol (PROVENTIL HFA;VENTOLIN HFA) 108 (90 BASE) MCG/ACT inhaler 1 puff  1 puff Inhalation Q6H PRN Leone Brand, NP      . albuterol (PROVENTIL HFA;VENTOLIN HFA) 108 (90 BASE) MCG/ACT inhaler 1 puff  1 puff Inhalation Q6H PRN Leone Brand, NP      . ALPRAZolam Prudy Feeler) tablet 0.25 mg  0.25 mg Oral Q6H PRN Kathlee Nations Trigt III, MD      . aspirin EC tablet 325 mg  325 mg Oral Daily Kathlee Nations Trigt III, MD   325 mg at 01/16/11 1003  . bisacodyl (DULCOLAX) EC tablet 10 mg  10 mg Oral Daily Kathlee Nations Trigt III, MD   10 mg at 01/16/11 1003   Or  . bisacodyl (DULCOLAX) suppository 10 mg  10 mg Rectal Daily Kathlee Nations Trigt III, MD      . carisoprodol (SOMA) tablet 350 mg  350 mg Oral QID PRN Leone Brand, NP   350 mg at 01/15/11 2030  . cefUROXime (ZINACEF) 1.5 g in dextrose 5 % 50 mL IVPB  1.5 g Intravenous Q12H Kathlee Nations Trigt III, MD   1.5 g at 01/16/11 1004  . fentaNYL (DURAGESIC - dosed mcg/hr) 50 mcg  50 mcg Transdermal Q72H Leone Brand, NP   50 mcg at  01/16/11 0000  . Fluticasone-Salmeterol (ADVAIR) 250-50 MCG/DOSE inhaler 1 puff  1 puff Inhalation BID Marcelino Scot, PHARMD   1 puff at 01/16/11 2131  . folic acid (FOLVITE) tablet 1 mg  1 mg Oral Daily Donielle Margaretann Loveless, PA      . furosemide (LASIX) injection 20 mg  20 mg Intravenous Once Kathlee Nations Trigt III, MD   20 mg at 01/16/11 1810  . furosemide (LASIX) tablet 40 mg  40 mg Oral Daily Kathlee Nations Trigt III, MD      . gabapentin (NEURONTIN) capsule 1,200 mg  1,200 mg Oral QHS Piedad Climes Harding   1,200 mg at 01/16/11 2223  . gabapentin (NEURONTIN) tablet 600 mg  600 mg Oral BID Piedad Climes Harding   600 mg at 01/16/11 1200  . HYDROmorphone (DILAUDID) tablet 4 mg  4 mg Oral Q4H PRN Abelino Derrick, PA   4 mg at 01/17/11 1610  . hydroxychloroquine (PLAQUENIL) tablet 400 mg  400 mg Oral QHS Piedad Climes Harding   400 mg at 01/16/11 2218  . insulin aspart (novoLOG) injection 0-24 Units  0-24 Units Subcutaneous TID AC & HS Kathlee Nations Suann Larry, MD  2 Units at 01/17/11 0620  . metoCLOPramide (REGLAN) tablet 10 mg  10 mg Oral TID AC & HS Lora Poteet Seay, PHARMD   10 mg at 01/16/11 2218  . metoprolol tartrate (LOPRESSOR) tablet 12.5 mg  12.5 mg Oral BID Kathlee Nations Trigt III, MD   12.5 mg at 01/16/11 2218  . montelukast (SINGULAIR) tablet 10 mg  10 mg Oral Daily Leone Brand, NP   10 mg at 01/16/11 1003  . ondansetron (ZOFRAN) injection 4 mg  4 mg Intravenous Q6H PRN Kathlee Nations Trigt III, MD   4 mg at 01/14/11 1615  . pantoprazole (PROTONIX) EC tablet 40 mg  40 mg Oral Q1200 Kathlee Nations Trigt III, MD   40 mg at 01/16/11 1136  . polysaccharide iron (NIFEREX) capsule 150 mg  150 mg Oral Daily Donielle Margaretann Loveless, PA      . potassium chloride SA (K-DUR,KLOR-CON) CR tablet 20 mEq  20 mEq Oral Daily Kathlee Nations Trigt III, MD   20 mEq at 01/16/11 1802  . rosuvastatin (CRESTOR) tablet 10 mg  10 mg Oral Daily Leone Brand, NP   10 mg at 01/16/11 1003  . simethicone (MYLICON) chewable tablet 80 mg  80 mg Oral QID PRN  Kathlee Nations Trigt III, MD      . zolpidem [facility]) tablet 5 mg  5 mg Oral QHS PRN Gary Fleet Abbott, PHARMD      . DISCONTD: acetaminophen (TYLENOL) solution 975 mg  975 mg Per Tube Q6H [name] III, MD      . DISCONTD: aspirin chewable tablet 324 mg  324 mg Per Tube Daily Kathlee Nations Trigt III, MD      . DISCONTD: diphenhydrAMINE (BENADRYL) capsule 25 mg  25 mg Oral QHS PRN Ardelle Balls, PA      . DISCONTD: docusate sodium (COLACE) capsule 200 mg  200 mg Oral Daily Kathlee Nations Trigt III, MD   200 mg at 01/16/11 1003  . DISCONTD: furosemide (LASIX) injection 20 mg  20 mg Intravenous BID Kathlee Nations Trigt III, MD   20 mg at 01/16/11 1610  . DISCONTD: HYDROmorphone (DILAUDID) injection 1-2 mg  1-2 mg Intravenous Q3H PRN Kathlee Nations Trigt III, MD   2 mg at 01/16/11 0400  . DISCONTD: insulin aspart (novoLOG) injection 0-24 Units  0-24 Units Subcutaneous Q4H [name] III, MD   2 Units at 01/16/11 1246  . DISCONTD: metoprolol (LOPRESSOR) injection 2.5-5 mg  2.5-5 mg Intravenous Q2H PRN Kathlee Nations Trigt III, MD      . DISCONTD: metoprolol tartrate (LOPRESSOR) 25 mg/10 mL oral suspension 12.5 mg  12.5 mg Per Tube BID Kathlee Nations Trigt III, MD      . DISCONTD: phenylephrine (NEO-SYNEPHRINE) 20,000 mcg in dextrose 5 % 250 mL infusion  0-100 mcg/min Intravenous Continuous [name] III, MD   5 mcg/min at 01/15/11 1700  . DISCONTD: sodium chloride 0.9 % injection 3 mL  3 mL Intravenous Q12H Kathlee Nations Trigt III, MD   3 mL at 01/16/11 0957  . DISCONTD: sodium chloride 0.9 % injection 3 mL  3 mL Intravenous PRN [name] III, MD        PE: General appearance: alert, cooperative and no distress Lungs: Decreased BS bilateral bases > on the right Heart: regular rate and rhythm, S1, S2 normal, no murmur, click, rub or gallop Extremities: trace LEE on the right Pulses: 2+ and symmetric radials.  1+ DPs  Lab Results:  Basename 01-18-11 0556 01/16/11 0330 01/15/11 1718 01/15/11 1715    WBC 10.0 11.2* -- 14.0*  HGB 8.8* 9.4* 9.9* --  HCT 26.7* 27.9* 29.0* --  PLT 87* 72* -- 97*   BMET  Basename 2011-01-18 0556 01/16/11 0330 01/15/11 1718 01/15/11 0326  NA 135 136 134* --  K 4.0 4.5 4.1 --  CL 100 103 102 --  CO2 23 25 -- 24  GLUCOSE 170* 135* 140* --  BUN 26* 19 23 --  CREATININE 0.85 0.95 1.00 --  CALCIUM 8.3* 8.3* -- 8.0*   PT/INR  Basename 01/14/11 1400  LABPROT 15.4*  INR 1.19    Studies/Results: CHEST - 2 VIEW, 2011/01/18  Comparison: 01/16/2011  Findings: The right IJ Cordis has been removed. The cardiac  silhouette, mediastinal and hilar contours are stable. The lungs  demonstrate improved aeration with resolving areas of atelectasis  and resolving pleural effusions. No pneumothorax.  IMPRESSION:  1. Improved aeration with resolving atelectasis and effusions.  2. Removal of right IJ central venous Cordis.  Assessment/Plan  Principal Problem:  *S/P CABG x 3: (lima-lad,svg-om,svg-rca) Active Problems:  CAD (coronary artery disease): multivessel  Crescendo angina, with EKG changes, new.  Back pain, chronic--plans were for cervical disc surgery week of 01/16/11  Asthma  Fibromyalgia  Plan:  Foley cath inserted.  Pt ambulated 500 feet today.  Improved CXR.  BP/HR stable and controlled.  Continues to diurese.   LOS: 5 days    HAGER,BRYAN W 2011-01-18 8:48 AM  I have seen and examined the patient along Leotis Shames, PA.  I have reviewed the chart, notes and new data.  I agree with PA's note.  Key new complaints: still has Foley in; saw Dr. Aldean Ast in past for urinary retention Key examination changes: two back-to-back episodes of 5-beat NSVT at 8 AM, no recurrence since Key new findings / data: mild anemia (8.8), slowly improving paltelet count, normal electrolytes.  PLAN: POD #3 CABG x3-(lima-lad,svg-om,svg-rca).  If NSVT recurs would increase beta blocker dose, although BP a little soft still; otherwise progressing well post  CABG. Urinary retention not a new problem, has had to briefly do in-and-out self cath at home before.  Thurmon Fair, MD, [facility] [phone] 01/18/2011, 3:05 PM

## 2011-01-18 DIAGNOSIS — I4729 Other ventricular tachycardia: Secondary | ICD-10-CM | POA: Diagnosis not present

## 2011-01-18 DIAGNOSIS — R339 Retention of urine, unspecified: Secondary | ICD-10-CM | POA: Diagnosis not present

## 2011-01-18 DIAGNOSIS — R739 Hyperglycemia, unspecified: Secondary | ICD-10-CM | POA: Diagnosis not present

## 2011-01-18 DIAGNOSIS — E8770 Fluid overload, unspecified: Secondary | ICD-10-CM | POA: Diagnosis present

## 2011-01-18 DIAGNOSIS — D649 Anemia, unspecified: Secondary | ICD-10-CM | POA: Diagnosis not present

## 2011-01-18 DIAGNOSIS — I472 Ventricular tachycardia: Secondary | ICD-10-CM | POA: Diagnosis not present

## 2011-01-18 LAB — GLUCOSE, CAPILLARY
Glucose-Capillary: 109 mg/dL — ABNORMAL HIGH (ref 70–99)
Glucose-Capillary: 92 mg/dL (ref 70–99)
Glucose-Capillary: 87 mg/dL (ref 70–99)

## 2011-01-18 MED ORDER — ASPIRIN EC 81 MG PO TBEC
81.0000 mg | DELAYED_RELEASE_TABLET | Freq: Every day | ORAL | Status: DC
  Administered 2011-01-19 – 2011-01-21 (×3): 81 mg via ORAL
  Filled 2011-01-18 (×3): qty 1

## 2011-01-18 MED ORDER — CLOPIDOGREL BISULFATE 75 MG PO TABS
75.0000 mg | ORAL_TABLET | Freq: Every day | ORAL | Status: DC
Start: 2011-01-18 — End: 2011-01-21
  Administered 2011-01-18 – 2011-01-21 (×3): 75 mg via ORAL
  Filled 2011-01-18 (×5): qty 1

## 2011-01-18 MED ORDER — POLYSACCHARIDE IRON 150 MG PO CAPS: 150.0000 mg | ORAL_CAPSULE | Freq: Every day | ORAL | Status: DC

## 2011-01-18 MED ORDER — METOPROLOL TARTRATE 12.5 MG HALF TABLET
12.5000 mg | ORAL_TABLET | Freq: Three times a day (TID) | ORAL | Status: DC
  Administered 2011-01-18 – 2011-01-21 (×9): 12.5 mg via ORAL
  Filled 2011-01-18 (×11): qty 1

## 2011-01-18 MED ORDER — FOLIC ACID 1 MG PO TABS: 1.0000 mg | ORAL_TABLET | Freq: Every day | ORAL | Status: DC

## 2011-01-18 MED ORDER — METOPROLOL TARTRATE 12.5 MG HALF TABLET: 12.5000 mg | ORAL_TABLET | Freq: Three times a day (TID) | ORAL | Status: DC

## 2011-01-18 MED ORDER — ASPIRIN 325 MG PO TBEC: 325.0000 mg | DELAYED_RELEASE_TABLET | Freq: Every day | ORAL | Status: DC

## 2011-01-18 MED ORDER — ROSUVASTATIN CALCIUM 10 MG PO TABS: 10.0000 mg | ORAL_TABLET | Freq: Every day | ORAL | Status: DC

## 2011-01-18 MED ORDER — POTASSIUM CHLORIDE CRYS ER 20 MEQ PO TBCR: 20.0000 meq | EXTENDED_RELEASE_TABLET | Freq: Every day | ORAL | Status: DC

## 2011-01-18 MED ORDER — GABAPENTIN 400 MG PO CAPS: 1200.0000 mg | ORAL_CAPSULE | Freq: Every day | ORAL | Status: DC

## 2011-01-18 MED ORDER — HYDROMORPHONE HCL 4 MG PO TABS: 4.0000 mg | ORAL_TABLET | Freq: Four times a day (QID) | ORAL | Status: DC | PRN

## 2011-01-18 MED ORDER — FUROSEMIDE 40 MG PO TABS: 40.0000 mg | ORAL_TABLET | Freq: Every day | ORAL | Status: DC

## 2011-01-18 NOTE — Progress Notes (Signed)
Called to see patient re: visual changes in left eye.  She reports developing blurry vision with a central light area in her left eye which she had not previously had.  It was transient and now has resolved.  No facial weakness, dysphagia, or other neuro symptoms.  She has taken Dilaudid and Soma today, which she normally takes at home.  She sees Dr. Ashley Royalty for retinal checks since she is on Plaquenil, and reports having had "floaters" in the past.  PE: BP 97/58  Pulse 95  Temp(Src) 97.8 F (36.6 C) (Oral)  Resp 18  Ht 5\' 5"  (1.651 m)  Wt 78.2 kg (172 lb 6.4 oz)  BMI 28.69 kg/m2  SpO2 91%  Pupils reactive, equal.  Face symmetrical.  Grips equal.  No tongue deviation.  Motor, sensory intact. Heart RRR.  Lungs clear.  A/P: Transient visual changes, etiology unclear and now resolved.  Will monitor.

## 2011-01-18 NOTE — Progress Notes (Signed)
Nsg 1000 - Pt ambulated 238ft with RW, independently, steady gait. Pt stated that she felt SOB upon return, but sats 92% ra.  Will continue to monitor.  Ninetta Lights 01/18/2011 11:58 AM

## 2011-01-18 NOTE — Progress Notes (Signed)
Subjective:  Slept well last night. Some DOE  Objective:  Vital Signs in the last 24 hours: Temp:  [97.9 F (36.6 C)-99.1 F (37.3 C)] 99.1 F (37.3 C) (01/16 0454) Pulse Rate:  [86-92] 92  (01/16 0454) Resp:  [18-19] 19  (01/16 0454) BP: (91-104)/(58-64) 102/64 mmHg (01/16 0454) SpO2:  [90 %-94 %] 90 % (01/16 0454) Weight:  [78.2 kg (172 lb 6.4 oz)] 78.2 kg (172 lb 6.4 oz) (01/16 0454)  Intake/Output from previous day:  Intake/Output Summary (Last 24 hours) at 01/18/11 0926 Last data filed at 01/18/11 0457  Gross per 24 hour  Intake    840 ml  Output   1950 ml  Net  -1110 ml      . acetaminophen  1,000 mg Oral Q6H  . aspirin EC  325 mg Oral Daily  . bisacodyl  10 mg Oral Daily   Or  . bisacodyl  10 mg Rectal Daily  . fentaNYL  50 mcg Transdermal Q72H  . Fluticasone-Salmeterol  1 puff Inhalation BID  . folic acid  1 mg Oral Daily  . furosemide  40 mg Oral Daily  . gabapentin  1,200 mg Oral QHS  . gabapentin  600 mg Oral BID  . hydroxychloroquine  400 mg Oral QHS  . insulin aspart  0-24 Units Subcutaneous TID AC & HS  . metoprolol tartrate  12.5 mg Oral BID  . montelukast  10 mg Oral Daily  . pantoprazole  40 mg Oral Q1200  . polysaccharide iron  150 mg Oral Daily  . potassium chloride  20 mEq Oral Daily  . rosuvastatin  10 mg Oral Daily   Physical Exam: General appearance: alert, cooperative and no distress Lungs: basilar crackles Heart: regular rate and rhythm ABD  Soft EXT ecchymosis at r leg harvest site, trivial edema  Rate: 90  Rhythm: normal sinus rhythm  Lab Results: Results for orders placed during the hospital encounter of 01/12/11 (from the past 24 hour(s))  GLUCOSE, CAPILLARY     Status: Abnormal   Collection Time   01/17/11 11:17 AM      Component Value Range   Glucose-Capillary 141 (*) 70 - 99 (mg/dL)   Comment 1 Documented in Chart     Comment 2 Notify RN    GLUCOSE, CAPILLARY     Status: Abnormal   Collection Time   01/17/11  5:04 PM    Component Value Range   Glucose-Capillary 102 (*) 70 - 99 (mg/dL)   Comment 1 Documented in Chart     Comment 2 Notify RN    GLUCOSE, CAPILLARY     Status: Abnormal   Collection Time   01/17/11  9:06 PM      Component Value Range   Glucose-Capillary 131 (*) 70 - 99 (mg/dL)   Comment 1 Documented in Chart     Comment 2 Notify RN    GLUCOSE, CAPILLARY     Status: Abnormal   Collection Time   01/18/11  6:18 AM      Component Value Range   Glucose-Capillary 109 (*) 70 - 99 (mg/dL)   Comment 1 Documented in Chart     Comment 2 Notify RN      [facility] 01/17/11 0556 01/16/11 0330  WBC 10.0 11.2*  HGB 8.8* 9.4*  PLT 87* 72*    Basename 01/17/11 0556 01/16/11 0330  NA 135 136  K 4.0 4.5  CL 100 103  CO2 23 25  GLUCOSE 170* 135*  BUN 26* 19  CREATININE 0.85 0.95   No results found for this basename: TROPONINI:2,CK,MB:2 in the last 72 hours Hepatic Function Panel No results found for this basename: PROT,ALBUMIN,AST,ALT,ALKPHOS,BILITOT,BILIDIR,IBILI in the last 72 hours No results found for this basename: CHOL in the last 72 hours No results found for this basename: INR in the last 72 hours  Imaging: Dg Chest 2 View  01/17/2011  *RADIOLOGY REPORT*  Clinical Data: Bypass surgery.  CHEST - 2 VIEW  Comparison: 01/16/2011  Findings: The right IJ Cordis has been removed.  The cardiac silhouette, mediastinal and hilar contours are stable.  The lungs demonstrate improved aeration with resolving areas of atelectasis and resolving pleural effusions.  No pneumothorax.  IMPRESSION:  1.  Improved aeration with resolving atelectasis and effusions. 2.  Removal of right IJ central venous Cordis.  Original Report Authenticated By: P. Loralie Champagne, M.D.    Cardiac Studies:  Assessment/Plan:   Principal Problem: Crescendo angina, with EKG changes, new.  Active Problems:   *S/P CABG x 3: (lima-lad,svg-om,svg-rca)  CAD (coronary artery disease): multivessel, good LVF 50% at cath  Volume  overload, post op, diuresing on lasix  NSVT (nonsustained ventricular tachycardia), short runs 1/15, on low dose beta blocker secondary to low b/p  Back pain, chronic--plans were for cervical disc surgery week of 01/16/11  Asthma  Fibromyalgia  Urinary retention, foley placed  Hyperglycemia, post op, no prior history of DM  Anemia, post op  Plan- As noted, increase beta blocker when able.    Corine Shelter PA-C 01/18/2011, 9:26 AM    Patient seen and examined. Agree with assessment and plan. Pt feels well. BP now somewhat improved.  Will try to titrate Metoprolol to 12.5 every 8 hrs if BP allows.    Lennette Bihari, MD, [facility] 01/18/2011 9:46 AM

## 2011-01-18 NOTE — Progress Notes (Addendum)
4 Days Post-Op Procedure(s) (LRB): CORONARY ARTERY BYPASS GRAFTING (CABG) (N/A)  Subjective: Patient able to sleep with Ambien.  Objective: Vital signs in last 24 hours: Patient Vitals for the past 24 hrs:  BP Temp Temp src Pulse Resp SpO2 Weight  01/18/11 0454 102/64 mmHg 99.1 F (37.3 C) Oral 92  19  90 % 172 lb 6.4 oz (78.2 kg)  01/17/11 2205 91/62 mmHg 98.1 F (36.7 C) Oral 90  18  91 % -  01/17/11 2142 - - - - - 94 % -  01/17/11 1333 95/62 mmHg 97.9 F (36.6 C) Oral 86  18  93 % -  01/17/11 0950 104/58 mmHg - - 91  - - -   Pre op weight 75 kg Current Weight  01/18/11 172 lb 6.4 oz (78.2 kg)      Intake/Output from previous day: 01/15 0701 - 01/16 0700 In: 960 [P.O.:960] Out: 1950 [Urine:1950]   Physical Exam:  Cardiovascular: Slightly tachy, no murmurs, gallops, or rubs. Pulmonary: Slightly diminished at bases;no rales, wheezes, or rhonchi. Abdomen: Soft, non tender, bowel sounds present. Extremities: Mild bilateral lower extremity edema.Ecchymosis of right lower extremity. Wounds: Clean and dry.  No erythema or signs of infection.  Lab Results: CBC:  Basename 01/17/11 0556 01/16/11 0330  WBC 10.0 11.2*  HGB 8.8* 9.4*  HCT 26.7* 27.9*  PLT 87* 72*   BMET:   Basename 01/17/11 0556 01/16/11 0330  NA 135 136  K 4.0 4.5  CL 100 103  CO2 23 25  GLUCOSE 170* 135*  BUN 26* 19  CREATININE 0.85 0.95  CALCIUM 8.3* 8.3*    PT/INR: No results found for this basename: LABPROT,INR in the last 72 hours ABG:  INR: Will add last result for INR, ABG once components are confirmed Will add last 4 CBG results once components are confirmed  Assessment/Plan:  1. CV - SR/ST. Continue Lopressor 12.5 bid.Will increase Lopressor when SBP tolerates as HR remains in low 100's. 2.  Pulmonary - Encourage incentive spirometer. On room air this am. 3. Volume Overload - Continue with diuresis. 4.  Acute blood loss anemia - H/H slightly decreased to 8.8/26.7.Continue Nu Iron  and folic acid. 5.Thrombocytopenia-Platelets slightly increased to 87,000. 6.Urinary retention-foley re inserted last evening.Will remove foley in am  for voiding trial. 7.Remove EPW. 8.Probable discharge in am.   ZIMMERMAN,DONIELLE MPA-C 01/18/2011

## 2011-01-18 NOTE — Progress Notes (Signed)
12:37 PM - D/C EPW per protocol and as ordered. Pt tolerated well, no bleeding/ectopy noted, reminded to lie supine approximately one hour.  Will continue to monitor.  Ninetta Lights 01/18/2011

## 2011-01-18 NOTE — Progress Notes (Signed)
Pt walked 550 ft on RA with her own rolling walker with steady gait. Tolerated well and  denies any SOB.

## 2011-01-18 NOTE — Discharge Summary (Signed)
[address]            [place] [address]          [phone]      [name] Long 1940/05/26 71 y.o. [id number]  01/12/2011   Kathlee Nations Trigt III, MD  chest pain Chest Pain [place] CAD L main stenosis   HPI:( at the time of presentation)  71 year old white married female presented to her primary care physician's office today with increasing episodes of chest pain. Earlier this week she had an episode of crushing chest pain with associated shortness of breath. Then yesterday she had a return of crushing chest pain again with associated shortness of breath. Last evening she had another episode of chest discomfort they're not quite as severe. She was seen by Dr. Susann Givens and EKG was done which had changed he now has flipped T waves in leads V1 through V4. She was sent emergently to the emergency room. She has been seen by Dr. York Ram and evaluated.  Currently on exam she had mild chest discomfort, IV heparin and IV nitroglycerin were started and at this time the chest pain is completely resolved.  Initial cardiac enzymes are negative. But due to the significance of her chest pain with abnormal EKG plans will be to do cardiac catheterization on 01/13/2011. Patient is adopted we do not know her family history.  She denies diabetes no history of stroke or heart attack no hypertension no elevated cholesterol. She does have a history of asthma and uses inhalers as well as Singulair.  In 2004 she was hospitalized for sepsis at that time she underwent a 2-D echo. EF was normal. There were no significant valvular issues  Please note patient was to undergo cervical disc surgery at [facility] next week for significant back pain. Dr. Allyson Sabal discussed bare-metal stents as well as drug alluding stents with the patient and has recommended bare-metal stent due to the severity of her neck pain. She is on po dilauded as well as fentanyl patch and takes soma as well. She was admitted  for further evaluation and treatment to include cardiac catheterization. This was done on 01/13/2011 the following results were noted:   12:42 PM  [name] 71 y.o. female  PRE-OPERATIVE DIAGNOSIS: [place]  Full note dictated; see diagram.  Severely calcified LM, LAD, LCX and prox RCA.  LM: eccentric 60%  LAD: 99% near ostial  LCX: 90 - 95% ostial eccentric stenosis  RCA: 60 proximal  LV: Mild anterolateral hypokinesis; EF 50 - 55%  Normal aortoiliac and left subclavian and LIMA.  REC; CABG  Do to these findings surgical consultation was then obtained with Kathlee Nations tried M.D. who evaluated the patient and studies and agreed with recommendations to proceed with surgical revascularization.   Past Medical History   Diagnosis  Date   .  Allergy      RHINITIS   .  Asthma    .  Fibromyalgia    .  Fibromyalgia    .  Chronic low back pain    .  Osteoporosis      OSTEOPENIA   .  PPD positive    .  Connective tissue disease    .  Urinary incontinence    .  Hypertension    .  Crescendo angina, with EKG changes, new.  01/12/2011   .  Abnormal EKG  01/12/2011   .  Back pain, chronic--plans were for cervical disc surgery week of 01/16/11  01/12/2011   .  Asthma  01/12/2011    Past Surgical History   Procedure  Date   .  Appendectomy    .  Abdominal hysterectomy    .  Bladder repair    .  Cervical fusion    .  Breast surgery      REDUCTION   .  Carpal tunnel release     Family History   Problem  Relation  Age of Onset   .  Adopted: Yes    Social History: reports that she quit smoking about 31 years ago. She has never used smokeless tobacco. She reports that she does not drink alcohol or use illicit drugs. she is married and has 2 children and 7 grandchildren. She is retired from [employer] she did use tobacco but stopped approximately 31 years ago she did smoke 2-1/2 packs a day but she states most of them just burned.  Allergies: No Known Allergies  Medications Prior to  Admission   Medication  Dose  Route  Frequency  Provider  Last Rate  Last Dose   .  0.9 % sodium chloride infusion   Intravenous  Continuous  Leone Brand, NP  20 mL/hr at 01/12/11 1757    .  heparin ADULT infusion 100 units/ml (25000 units/250 ml)  1,000 Units/hr  Intravenous  Continuous  Lennette Bihari, MD  10 mL/hr at 01/12/11 1854  1,000 Units/hr at 01/12/11 1854   .  heparin bolus via infusion 3,500 Units  3,500 Units  Intravenous  Once  Lennette Bihari, MD   3,500 Units at 01/12/11 1853   .  nitroGLYCERIN 0.2 mg/mL in dextrose 5 % infusion  5 mcg/min  Intravenous  Titrated  Leone Brand, NP  1.5 mL/hr at 01/12/11 1757  5 mcg/min at 01/12/11 1757   .  DISCONTD: heparin PEDS 100 units/mL bolus via infusion 3,500 Units  3,500 Units  Intravenous  Once  Lennette Bihari, MD      Medications Prior to Admission   Medication  Sig  Dispense  Refill   .  albuterol (PROVENTIL HFA;VENTOLIN HFA) 108 (90 BASE) MCG/ACT inhaler  Inhale 1 puff into the lungs every 6 (six) hours as needed. For shortness of breath     .  Calcium Carbonate-Vitamin D (CALCIUM + D PO)  Take 1 tablet by mouth daily.     .  carisoprodol (SOMA) 350 MG tablet  Take 350 mg by mouth 4 (four) times daily as needed.     .  fentaNYL (DURAGESIC - DOSED MCG/HR) 50 MCG/HR  Place 1 patch onto the skin every 3 (three) days.     .  fluticasone-salmeterol (ADVAIR HFA) 230-21 MCG/ACT inhaler  Inhale 2 puffs into the lungs 2 (two) times daily.     Marland Kitchen  gabapentin (NEURONTIN) 600 MG tablet  Take 600 mg by mouth 4 (four) times daily.     Marland Kitchen  HYDROmorphone (DILAUDID) 2 MG tablet  Take 2 mg by mouth every 4 (four) hours as needed. For pain     .  hydroxychloroquine (PLAQUENIL) 200 MG tablet  Take 200 mg by mouth daily.     .  montelukast (SINGULAIR) 10 MG tablet  Take 10 mg by mouth daily.      Review of Systems: At the time of consultation Cardiac Review of Systems: Y or N  Chest Pain [  yes ] Resting SOB [ no ] Exertional SOB [ yes ] Orthopnea [no ]   Pedal Edema [no ] Palpit no ations [ no ] Syncope [ no ] Presyncope [ ]   General Review of Systems: [Y] = yes [ ] =no  Constitional: recent weight change [ ] ; anorexia [ ] ; fatigue [ ] ; nausea [ ] ; night sweats [ ] ; fever [ ] ; or chills [ ] ;  Eye : blurred vision [ ] ; diplopia [ ] ; vision changes [ ] ; Amaurosis fugax[ ] ;  Resp: cough [ ] ; wheezing[ ] ; hemoptysis[ ] ; shortness of breath[ ] ; paroxysmal nocturnal dyspnea[ ] ; dyspnea on exertion[ ] ; or orthopnea[ ] ;  GI: gallstones[ ] , vomiting[ no ]; dysphagia[ ] ; melena[ ] ; hematochezia [ ] ; heartburn[ ] ; Hx of Colonoscopy[ ] ;  GU: kidney stones [ no; hematuria[ ] ; dysuria [ ] ; nocturia[ ] ; history of obstruction Mahler.Beck ];  Skin: rash, swelling[ ] ;, hair loss[ ] ; peripheral edema[ ] ; or itching[ ] ;  Musculosketetal: myalgias[yes ]; joint swelling[ ] ; joint erythema[ ] ;  joint pain[ ] ; back pain[ yes ];  Heme/Lymph: bruising[ ] ; bleeding[ ] ; anemia[ ] ;  Neuro: TIA[ ] ; headaches[ ] ; stroke[no ]; vertigo[ ] ; seizures[ ] ; paresthesias[ ] ; difficulty walking[ ] ;  Psych:depression[ ] ; anxiety[ ] ;  Endocrine: diabetes[no ]; thyroid dysfunction[ ] ;  Immunizations: Flu [ ] ; Pneumococcal[ ] ;  Other:  Physical Exam: At the time of consultation BP 128/76  Pulse 70  Temp(Src) 98.1 F (36.7 C) (Tympanic)  Resp 11  Ht 5\' 5"  (1.651 m)  Wt 165 lb 5.5 oz (75 kg)  BMI 27.51 kg/m2  SpO2 94%  Exam  Middle-aged female in the CCU in no distress  HEENT normocephalic pupils equal  Chest distant breath sounds no deformity  Cardiac sinus rhythm no gallop or murmur  Abdomen soft obese nontender without pulsatile mass  Extremities no cyanosis or tenderness positive peripheral pulses  No focal weakness  Diagnostic Studies & Laboratory data:Cardiac cath reviewed with Dr. Nicholaus Bloom she has severe proximal LAD and circumflex stenosis with mild to moderate RCA stenosis good LV function chest x-ray shows no active disease carotid duplex scans and PFTs are pending  Recent  Radiology Findings:  Dg Chest Port 1 View  01/12/2011 *RADIOLOGY REPORT* Clinical Data: Chest and left arm pain with shortness of breath. PORTABLE CHEST - 1 VIEW Comparison: 07/19/2009. Findings: Normal heart size with clear lung fields. No bony abnormality. IMPRESSION: No active cardiopulmonary disease. No change from priors. Original Report Authenticated By: Elsie Stain, M.D.   Recent Lab Findings:  Lab Results   Component  Value  Date    WBC  7.7  01/13/2011    HGB  13.5  01/13/2011    HCT  40.3  01/13/2011    PLT  165  01/13/2011    GLUCOSE  101*  01/13/2011    CHOL  238*  01/12/2011    TRIG  98  01/12/2011    HDL  110  01/12/2011    LDLCALC  108*  01/12/2011    ALT  17  01/12/2011    AST  26  01/12/2011    NA  140  01/13/2011    K  4.1  01/13/2011    CL  106  01/13/2011    CREATININE  0.84  01/13/2011    BUN  15  01/13/2011    CO2  26  01/13/2011    TSH  0.994  01/12/2011    INR  1.04  01/13/2011    HGBA1C  5.7*  01/12/2011      Procedure: On 01/14/2011 he was taken the operating room where he underwent the following procedure   OPERATIVE REPORT  OPERATION:  1. Coronary artery bypass grafting x3 (left internal mammary artery to  left internal mammary, saphenous vein graft to obtuse marginal,  saphenous vein graft to right coronary artery).  2. Endoscopic harvest of right leg greater saphenous vein.  PREOPERATIVE DIAGNOSIS: Severe left main and three-vessel coronary  artery disease with unstable angina.  POSTOPERATIVE DIAGNOSIS: Severe left main and three-vessel coronary  artery disease with unstable angina.  SURGEON: [name], M.D.  ASSISTANT: Al Corpus, SA.  ANESTHESIA: General by Dr. Hart Robinsons.  The patient tolerated the procedure well was taken to the surgical intensive care unit in stable condition.  Postoperative hospital course:   Patient has overall progressed quite nicely. She has remained in normal sinus rhythm without significant cardiac dysrhythmias.  She has remained hemodynamically stable. He was weaned from the ventilator without difficulty. All routine lines, monitors, drainage devices and all discontinued in the standard fashion. She does have a moderate postoperative volume overload but is responding well to diuresis. He does have a mild acute blood loss anemia. Her values have stabilized. She is currently on both iron and folate acid supplement. A postoperative thrombocytopenia has stabilized. Most recent platelet count is 87,000. She has had some difficulty with postoperative voiding in a Foley was required to be replaced. This will be DC'd prior to discharge. She is progressing nicely in cardiac rehabilitation. Her chronic pain is stable on current medical regimen. Incisions are healing well without evidence of infection. Overall her status is felt to be tentatively stable for discharge in the morning pending reevaluation.  Basename 01/17/11 0556 01/16/11 0330  NA 135 136  K 4.0 4.5  CL 100 103  CO2 23 25  GLUCOSE 170* 135*  BUN 26* 19  CALCIUM 8.3* 8.3*    Basename 01/17/11 0556 01/16/11 0330  WBC 10.0 11.2*  HGB 8.8* 9.4*  HCT 26.7* 27.9*  PLT 87* 72*   No results found for this basename: INR:2 in the last 72 hours   Discharge Instructions:  The patient is discharged to home with extensive instructions on wound care and progressive ambulation.  They are instructed not to drive or perform any heavy lifting until returning to see the physician in his office.  Discharge Diagnosis:  chest pain Chest Pain [place] CAD L main stenosis  Secondary Diagnosis: Patient Active Problem List  Diagnoses  . Crescendo angina, with EKG changes, new.  . Back pain, chronic--plans were for cervical disc surgery week of 01/16/11  . Asthma  . Fibromyalgia  . S/P CABG x 3: (lima-lad,svg-om,svg-rca)  . CAD (coronary artery disease): multivessel, good LVF 50% at cath  . Urinary retention, foley placed  . Volume overload, post op  .  Hyperglycemia, post op, no prior history of DM  . Anemia, post op  . NSVT (nonsustained ventricular tachycardia), short runs 1/15   Past Medical History  Diagnosis Date  . Allergy     RHINITIS  . Asthma   . Fibromyalgia   . Fibromyalgia   . Chronic low back pain   . Osteoporosis     OSTEOPENIA  . PPD positive   . Connective tissue disease   . Urinary incontinence   . Hypertension   . Crescendo angina, with EKG changes, new. 01/12/2011  . Abnormal EKG 01/12/2011  . Back pain, chronic--plans were for cervical disc surgery week  of 01/16/11 01/12/2011  . Asthma 01/12/2011       [name] Long, [name] Long  Home Medication Instructions [id number]   Printed on:01/18/11 1009  Medication Information                    gabapentin (NEURONTIN) 600 MG tablet Take 600 mg by mouth 4 (four) times daily.            hydroxychloroquine (PLAQUENIL) 200 MG tablet Take 200 mg by mouth daily.            montelukast (SINGULAIR) 10 MG tablet Take 10 mg by mouth daily.            fluticasone-salmeterol (ADVAIR HFA) 230-21 MCG/ACT inhaler Inhale 2 puffs into the lungs 2 (two) times daily.             fentaNYL (DURAGESIC - DOSED MCG/HR) 50 MCG/HR Place 1 patch onto the skin every 3 (three) days.             carisoprodol (SOMA) 350 MG tablet Take 350 mg by mouth 4 (four) times daily as needed.             Calcium Carbonate-Vitamin D (CALCIUM + D PO) Take 1 tablet by mouth daily.             Multiple Vitamin (MULITIVITAMIN WITH MINERALS) TABS Take 1 tablet by mouth daily.           Cholecalciferol (VITAMIN D) 2000 UNITS CAPS Take 2,000 Units by mouth daily.           albuterol (PROVENTIL HFA;VENTOLIN HFA) 108 (90 BASE) MCG/ACT inhaler Inhale 1 puff into the lungs every 6 (six) hours as needed. For shortness of breath           aspirin EC 325 MG EC tablet Take 1 tablet (325 mg total) by mouth daily.           folic acid (FOLVITE) 1 MG tablet Take 1 tablet (1 mg total) by mouth daily.             furosemide (LASIX) 40 MG tablet Take 1 tablet (40 mg total) by mouth daily.           gabapentin (NEURONTIN) 400 MG capsule Take 3 capsules (1,200 mg total) by mouth at bedtime.           HYDROmorphone (DILAUDID) 4 MG tablet Take 1 tablet (4 mg total) by mouth every 6 (six) hours as needed.           metoprolol tartrate (LOPRESSOR) 12.5 mg TABS Take 0.5 tablets (12.5 mg total) by mouth 3 (three) times daily.           polysaccharide iron (NIFEREX) 150 MG CAPS capsule Take 1 capsule (150 mg total) by mouth daily.           potassium chloride SA (K-DUR,KLOR-CON) 20 MEQ tablet Take 1 tablet (20 mEq total) by mouth daily.           rosuvastatin (CRESTOR) 10 MG tablet Take 1 tablet (10 mg total) by mouth daily.             Disposition: Home  Patient's condition is Good  Gershon Crane, PA-C 01/18/2011  10:09 AM

## 2011-01-18 NOTE — Progress Notes (Signed)
CARDIAC REHAB PHASE I   PRE:  Rate/Rhythm: 96SR  BP:  Supine:   Sitting: 90/52  Standing:    SaO2: 91%RA  MODE:  Ambulation: 750 ft   POST:  Rate/Rhythem: 105  BP:  Supine: 94/60  Sitting: \  Standing: \   SaO2: 92%RA 1423-1445 Pt walked 750 ft on RA with her own rolling walker with steady gait. Tolerated well. Slightly SOB but able to converse the whole walk. Took several standing rest breaks. To bed after walk.  [name] Long

## 2011-01-18 NOTE — Discharge Summary (Signed)
patient examined and medical record reviewed,agree with above note. VAN TRIGT III,Hema Lanza 01/18/2011   

## 2011-01-19 LAB — URINALYSIS, ROUTINE W REFLEX MICROSCOPIC
Bilirubin Urine: NEGATIVE
Glucose, UA: NEGATIVE mg/dL
Hgb urine dipstick: NEGATIVE
Ketones, ur: NEGATIVE mg/dL
Leukocytes, UA: NEGATIVE
Nitrite: NEGATIVE
Protein, ur: NEGATIVE mg/dL
Specific Gravity, Urine: 1.007 (ref 1.005–1.030)
Urobilinogen, UA: 0.2 mg/dL (ref 0.0–1.0)
pH: 6 (ref 5.0–8.0)

## 2011-01-19 LAB — GLUCOSE, CAPILLARY
Glucose-Capillary: 93 mg/dL (ref 70–99)
Glucose-Capillary: 129 mg/dL — ABNORMAL HIGH (ref 70–99)
Glucose-Capillary: 134 mg/dL — ABNORMAL HIGH (ref 70–99)

## 2011-01-19 MED ORDER — FOLIC ACID 1 MG PO TABS: 1.0000 mg | ORAL_TABLET | Freq: Every day | ORAL | Status: DC

## 2011-01-19 MED ORDER — CLOPIDOGREL BISULFATE 75 MG PO TABS: 75.0000 mg | ORAL_TABLET | Freq: Every day | ORAL | Status: DC

## 2011-01-19 MED ORDER — HYDROMORPHONE HCL 4 MG PO TABS: 4.0000 mg | ORAL_TABLET | Freq: Four times a day (QID) | ORAL | Status: AC | PRN

## 2011-01-19 MED ORDER — ASPIRIN 81 MG PO TBEC: 81.0000 mg | DELAYED_RELEASE_TABLET | Freq: Every day | ORAL | Status: DC

## 2011-01-19 MED ORDER — PHENAZOPYRIDINE HCL 100 MG PO TABS
100.0000 mg | ORAL_TABLET | Freq: Two times a day (BID) | ORAL | Status: DC
  Administered 2011-01-19 – 2011-01-20 (×3): 100 mg via ORAL
  Filled 2011-01-19 (×4): qty 1

## 2011-01-19 MED ORDER — POLYSACCHARIDE IRON 150 MG PO CAPS: 150.0000 mg | ORAL_CAPSULE | Freq: Every day | ORAL | Status: DC

## 2011-01-19 MED ORDER — POTASSIUM CHLORIDE CRYS ER 20 MEQ PO TBCR: 20.0000 meq | EXTENDED_RELEASE_TABLET | Freq: Every day | ORAL | Status: DC

## 2011-01-19 MED ORDER — FUROSEMIDE 40 MG PO TABS: 40.0000 mg | ORAL_TABLET | Freq: Every day | ORAL | Status: DC

## 2011-01-19 NOTE — Progress Notes (Signed)
Staff was called to pts room she was on the toilet and was crying and stated that she was in extreme pain.  She said she was having bladder spasms and she felt like she was having "labor contractions."  She said she felt like she was going to pass out and could not walk back to the bed.  We got her back in the bed and we received orders from [facility] PA to in and out cath the patient and start pyridium. In and out was done 1200 cc of urine came out.  A urine analysis was sent to the lab.  Pt is now resting comfortably in the bed with her family in the room.  Will continue to monitor closely

## 2011-01-19 NOTE — Discharge Summary (Signed)
Addendum Note to D/C BRITTANIE DOSANJH is a 71 y.o. female who is S/P Procedure(s): CORONARY ARTERY BYPASS GRAFTING (CABG).  Patient apparently had some visual changes in the left eye on 01/18/2011. She reported developing blurry vision with a central light area in her left eye. She does report that Dr. Ashley Royalty does routine retinal checks and she is on Plaquenil because she has a history of  "floaters". she she did have resolution of this yesterday and has not had any further instances. It should also be noted she has no other focal neuro deficits.Dr. Donata Clay placed the patient on Plavix 75 mg by mouth daily and  decreased her enteric-coated aspirin to 81 mg by mouth daily.an echocardiogram has been ordered for today. Echo results are currently not available. She had urinary retention and required foley reinsertion on 01/18/2011.Her foley was removed on 01/19/2011.She was unable to void on her own and had to be in and out ctht'd once. She was started on Pyridium and was able to void on her own without difficulty.  Enteric-coated aspirin to 81 mg by mouth daily in addition of Plavix 75 mg by mouth daily and Pyridium 100 mg po two times daily for 2 days.Also, her Neurontin schedule that she had taken preop was also resumed. Onlly additional followup appointment was that she needs to make a followup appointment with her medical doctor regarding further surveillacet of hemoglobin A1c that was 5.7 preoperatively.   No changes to History or  Physical Exam  Amiylah Anastos M 01/19/2011 11:24 AM

## 2011-01-19 NOTE — Progress Notes (Signed)
*   Echocardiogram 2D Echocardiogram has been performed.  Enolia Koepke F 01/19/2011, 6:01 PM

## 2011-01-19 NOTE — Progress Notes (Addendum)
5 Days Post-Op Procedure(s) (LRB): CORONARY ARTERY BYPASS GRAFTING (CABG) (N/A)  Subjective: Patient with visual changes (blurry vision with a central light) in left eye. No further occurrence since yesterday. Sleeping better with Ambien.  Objective: Vital signs in last 24 hours: Patient Vitals for the past 24 hrs:  BP Temp Temp src Pulse Resp SpO2  01/19/11 0444 105/68 mmHg 98.5 F (36.9 C) Oral 88  18  93 %  01/18/11 2058 111/74 mmHg 99.8 F (37.7 C) Oral 102  18  91 %  01/18/11 1651 106/60 mmHg - - - - -  01/18/11 1650 98/56 mmHg - - - - -  01/18/11 1500 97/58 mmHg 97.8 F (36.6 C) Oral 95  18  91 %  01/18/11 0954 114/66 mmHg - - 96  - -  01/18/11 0934 - - - - - 90 %   Pre op weight 75 kg Current Weight  01/18/11 172 lb 6.4 oz (78.2 kg)      Intake/Output from previous day: 01/16 0701 - 01/17 0700 In: 960 [P.O.:960] Out: 2150 [Urine:2150]   Physical Exam:  Cardiovascular: RRR, no murmurs, gallops, or rubs. Pulmonary: Slightly diminished at bases;no rales, wheezes, or rhonchi. Abdomen: Soft, non tender, bowel sounds present. Extremities: Mild bilateral lower extremity edema.Ecchymosis of right lower extremity. Wounds: Clean and dry.  No erythema or signs of infection.  Lab Results: CBC:  Basename 01/17/11 0556  WBC 10.0  HGB 8.8*  HCT 26.7*  PLT 87*   BMET:   Basename 01/17/11 0556  NA 135  K 4.0  CL 100  CO2 23  GLUCOSE 170*  BUN 26*  CREATININE 0.85  CALCIUM 8.3*    PT/INR: No results found for this basename: LABPROT,INR in the last 72 hours ABG:  INR: Will add last result for INR, ABG once components are confirmed Will add last 4 CBG results once components are confirmed  Assessment/Plan:  1. CV - SR/ST. Continue Lopressor 12.5 tid and Plavix.  2.  Pulmonary - Encourage incentive spirometer. On room air this am. 3. Volume Overload - Continue with diuresis. 4.  Acute blood loss anemia - H/H slightly decreased to 8.8/26.7.Continue Nu Iron  and folic acid. 5.Thrombocytopenia-Platelets slightly increased to 87,000. 6.Urinary retention-foley re inserted last evening.Will remove foley in am  for voiding trial. 7.Remove ct sutures. 8.Discharge disposition per Dr. Donata Clay.   ZIMMERMAN,DONIELLE MPA-C 01/19/2011  patient examined and medical record reviewed,agree with above note. VAN TRIGT III,Georjean Toya 01/19/2011   Will D/C home on plavix, asa81mg  check 2d ECHO poss home today or in am

## 2011-01-19 NOTE — Progress Notes (Signed)
Cardiac Rehab 1130-1150 Pt states she walked earlier. Completed ed with pt and son. Pt nauseated at end of ed. Notified pr's RN. Referring to Phase 2. Horald Birky DunlapRN

## 2011-01-19 NOTE — Progress Notes (Signed)
Pt voids this afternoon times three without any difficulty after foley d/c'd.

## 2011-01-19 NOTE — Progress Notes (Signed)
CSW completed brief assessment of pt needs on behalf of RNCM.  Pt states she is recovering well and only complaint is inability to void, which she states is extremely painful. Pt states she has strong support system, evidenced by husband, son, and friends at bedside who will be providing 24 hour care. No home health equipment or CSW needs identified. Pt appreciative of CSW visit.   Baxter Flattery, MSW [phone]

## 2011-01-19 NOTE — Progress Notes (Signed)
[facility]  Subjective: Transient vision loss in left half of left eye yesterday.  Lasted 10 minutes.  No further complaints.  Objective: Vital signs in last 24 hours: Temp:  [97.8 F (36.6 C)-99.8 F (37.7 C)] 98.5 F (36.9 C) (01/17 0444) Pulse Rate:  [88-102] 88  (01/17 0444) Resp:  [18] 18  (01/17 0444) BP: (97-114)/(56-74) 105/68 mmHg (01/17 0444) SpO2:  [90 %-93 %] 93 % (01/17 0444) Last BM Date: 01/16/11  Intake/Output from previous day: 01/16 0701 - 01/17 0700 In: 960 [P.O.:960] Out: 2150 [Urine:2150] Intake/Output this shift:    Medications Current Facility-Administered Medications  Medication Dose Route Frequency Provider Last Rate Last Dose  . acetaminophen (TYLENOL) tablet 1,000 mg  1,000 mg Oral Q6H Kathlee Nations Trigt III, MD   1,000 mg at 01/18/11 2000  . albuterol (PROVENTIL HFA;VENTOLIN HFA) 108 (90 BASE) MCG/ACT inhaler 1 puff  1 puff Inhalation Q6H PRN Leone Brand, NP      . ALPRAZolam Prudy Feeler) tablet 0.25 mg  0.25 mg Oral Q6H PRN Kathlee Nations Trigt III, MD      . aspirin EC tablet 81 mg  81 mg Oral Daily Kathlee Nations Trigt III, MD      . bisacodyl (DULCOLAX) EC tablet 10 mg  10 mg Oral Daily Kathlee Nations Trigt III, MD   10 mg at 01/18/11 8657   Or  . bisacodyl (DULCOLAX) suppository 10 mg  10 mg Rectal Daily Kathlee Nations Trigt III, MD      . carisoprodol (SOMA) tablet 350 mg  350 mg Oral QID PRN Leone Brand, NP   350 mg at 01/18/11 1708  . clopidogrel (PLAVIX) tablet 75 mg  75 mg Oral Q breakfast Kathlee Nations Trigt III, MD   75 mg at 01/18/11 2019  . fentaNYL (DURAGESIC - dosed mcg/hr) 50 mcg  50 mcg Transdermal Q72H Kathlee Nations Trigt III, MD   50 mcg at 01/19/11 0008  . Fluticasone-Salmeterol (ADVAIR) 250-50 MCG/DOSE inhaler 1 puff  1 puff Inhalation BID Marcelino Scot, PHARMD   1 puff at 01/18/11 2004  . folic acid (FOLVITE) tablet 1 mg  1 mg Oral Daily Ardelle Balls, PA   1 mg at 01/18/11 0953  . furosemide (LASIX) tablet 40 mg   40 mg Oral Daily Kathlee Nations Trigt III, MD   40 mg at 01/18/11 0952  . gabapentin (NEURONTIN) capsule 1,200 mg  1,200 mg Oral QHS Marykay Lex, MD   1,200 mg at 01/18/11 2106  . gabapentin (NEURONTIN) tablet 600 mg  600 mg Oral BID Marykay Lex, MD   600 mg at 01/18/11 1139  . HYDROmorphone (DILAUDID) tablet 4 mg  4 mg Oral Q4H PRN Abelino Derrick, PA   4 mg at 01/19/11 0539  . hydroxychloroquine (PLAQUENIL) tablet 400 mg  400 mg Oral QHS Marykay Lex, MD   400 mg at 01/18/11 2104  . insulin aspart (novoLOG) injection 0-24 Units  0-24 Units Subcutaneous TID AC & HS [name] III, MD   2 Units at 01/17/11 2133  . metoprolol tartrate (LOPRESSOR) tablet 12.5 mg  12.5 mg Oral TID Lennette Bihari, MD   12.5 mg at 01/18/11 2110  . montelukast (SINGULAIR) tablet 10 mg  10 mg Oral Daily Leone Brand, NP   10 mg at 01/18/11 0953  . ondansetron (ZOFRAN) injection 4 mg  4 mg Intravenous Q6H PRN Mikey Bussing, MD   4  mg at 01/14/11 1615  . pantoprazole (PROTONIX) EC tablet 40 mg  40 mg Oral Q1200 Kathlee Nations Trigt III, MD   40 mg at 01/18/11 1139  . polysaccharide iron (NIFEREX) capsule 150 mg  150 mg Oral Daily Ardelle Balls, PA   150 mg at 01/18/11 0953  . potassium chloride SA (K-DUR,KLOR-CON) CR tablet 20 mEq  20 mEq Oral Daily Kathlee Nations Trigt III, MD   20 mEq at 01/18/11 0952  . rosuvastatin (CRESTOR) tablet 10 mg  10 mg Oral Daily Leone Brand, NP   10 mg at 01/18/11 0953  . simethicone (MYLICON) chewable tablet 80 mg  80 mg Oral QID PRN Kathlee Nations Trigt III, MD      . zolpidem [facility]) tablet 5 mg  5 mg Oral QHS PRN Gary Fleet Abbott, PHARMD   5 mg at 01/18/11 1942  . DISCONTD: albuterol (PROVENTIL HFA;VENTOLIN HFA) 108 (90 BASE) MCG/ACT inhaler 1 puff  1 puff Inhalation Q6H PRN Leone Brand, NP      . DISCONTD: aspirin EC tablet 325 mg  325 mg Oral Daily Kathlee Nations Trigt III, MD   325 mg at 01/18/11 0952  . DISCONTD: metoprolol tartrate (LOPRESSOR) tablet 12.5 mg  12.5 mg  Oral BID Kathlee Nations Trigt III, MD   12.5 mg at 01/17/11 2112    PE: General appearance: alert, cooperative and no distress Lungs: Bilateral rales. Heart: regular rate and rhythm, S1, S2 normal, no murmur, click, rub or gallop Extremities: Trace LEE. Pulses: 2+ and symmetric  Lab Results:   [facility] 01/17/11 0556  WBC 10.0  HGB 8.8*  HCT 26.7*  PLT 87*   BMET  Basename 01/17/11 0556  NA 135  K 4.0  CL 100  CO2 23  GLUCOSE 170*  BUN 26*  CREATININE 0.85  CALCIUM 8.3*    Assessment/Plan  Principal Problem:  *Crescendo angina, with EKG changes, new. Active Problems:  CAD (coronary artery disease): multivessel, good LVF 50% at cath  Back pain, chronic--plans were for cervical disc surgery week of 01/16/11  Asthma  Fibromyalgia  S/P CABG x 3: (lima-lad,svg-om,svg-rca)  Urinary retention, foley placed  Volume overload, post op  Hyperglycemia, post op, no prior history of DM  Anemia, post op  NSVT (nonsustained ventricular tachycardia), short runs 1/15   Plan:  HR in the 90's, BP 9758- 114-66.  BB titrated to 12.5mg  TID.  No complaints with added control. No room for further titration.  Ambulated a lot yesterday.  Foley cath removed this AM.  Monitor urine output.  Will arrange FU at [facility].   LOS: 7 days    HAGER,BRYAN W 01/19/2011 7:39 AM  Agree with note written by Jones Skene PAC  Pt is S/P CABG X3 for USA/MV CAD. Recuperating nicely. Exam benign. Labs ok (plts 87k). Meds adjusted (BB). Recent short runs of NSVT. Pt had transient OS visual changes of unclear etiology. Plavix added. Pt ambulating w/o difficulty. D/C planning per TCTS. ROV with me 2-3 weeks.  Runell Gess 01/19/2011 8:12 AM

## 2011-01-20 LAB — URINE MICROSCOPIC-ADD ON

## 2011-01-20 LAB — URINE CULTURE
Colony Count: NO GROWTH
Culture  Setup Time: 201301190358
Culture: NO GROWTH
Special Requests: NORMAL

## 2011-01-20 LAB — URINALYSIS, ROUTINE W REFLEX MICROSCOPIC
Bilirubin Urine: NEGATIVE
Glucose, UA: NEGATIVE mg/dL
Hgb urine dipstick: NEGATIVE
Specific Gravity, Urine: 1.012 (ref 1.005–1.030)
pH: 6.5 (ref 5.0–8.0)

## 2011-01-20 MED ORDER — POTASSIUM CHLORIDE CRYS ER 20 MEQ PO TBCR: 20.0000 meq | EXTENDED_RELEASE_TABLET | Freq: Every day | ORAL | Status: DC

## 2011-01-20 MED ORDER — PHENAZOPYRIDINE HCL 100 MG PO TABS: 100.0000 mg | ORAL_TABLET | Freq: Two times a day (BID) | ORAL | Status: DC

## 2011-01-20 MED ORDER — CEFDINIR 125 MG/5ML PO SUSR
250.0000 mg | Freq: Two times a day (BID) | ORAL | Status: DC
  Filled 2011-01-20: qty 10

## 2011-01-20 MED ORDER — FUROSEMIDE 40 MG PO TABS: 40.0000 mg | ORAL_TABLET | Freq: Every day | ORAL | Status: DC

## 2011-01-20 NOTE — Consult Note (Signed)
Urology Consult   Physician requesting consult: Dr. Nicki Guadalajara  Reason for consult: Urinary retention  History of Present Illness: [name] Long is a 71 y.o. previously followed by Dr. Aldean Ast.  She does have a history of transient urinary retention in the distant past but has had no voiding issues since 2009. She has failed 3 voiding trials since she underwent a CABG on Saturday.  She has denied hematuria.  She does have a history of cervical neck problems and takes chronic narcotic pain medication for this reason.    Past Medical History  Diagnosis Date  . Allergy     RHINITIS  . Asthma   . Fibromyalgia   . Fibromyalgia   . Chronic low back pain   . Osteoporosis     OSTEOPENIA  . PPD positive   . Connective tissue disease   . Urinary incontinence   . Hypertension   . Crescendo angina, with EKG changes, new. 01/12/2011  . Abnormal EKG 01/12/2011  . Back pain, chronic--plans were for cervical disc surgery week of 01/16/11 01/12/2011  . Asthma 01/12/2011    Past Surgical History  Procedure Date  . Appendectomy   . Abdominal hysterectomy   . Bladder repair   . Cervical fusion   . Breast surgery     REDUCTION  . Carpal tunnel release   . Coronary artery bypass graft 01/14/2011    Procedure: CORONARY ARTERY BYPASS GRAFTING (CABG);  Surgeon: Kathlee Nations Suann Larry, MD;  Location: [facility] OR;  Service: Open Heart Surgery;  Laterality: N/A;  CABG x three, using right greater saphenous vein harvested endoscopically      Current Hospital Medications: Scheduled Meds:   . acetaminophen  1,000 mg Oral Q6H  . aspirin EC  81 mg Oral Daily  . bisacodyl  10 mg Oral Daily   Or  . bisacodyl  10 mg Rectal Daily  . clopidogrel  75 mg Oral Q breakfast  . fentaNYL  50 mcg Transdermal Q72H  . Fluticasone-Salmeterol  1 puff Inhalation BID  . folic acid  1 mg Oral Daily  . furosemide  40 mg Oral Daily  . gabapentin  1,200 mg Oral QHS  . gabapentin  600 mg Oral BID  . hydroxychloroquine  400  mg Oral QHS  . insulin aspart  0-24 Units Subcutaneous TID AC & HS  . metoprolol tartrate  12.5 mg Oral TID  . montelukast  10 mg Oral Daily  . pantoprazole  40 mg Oral Q1200  . phenazopyridine  100 mg Oral BID  . polysaccharide iron  150 mg Oral Daily  . potassium chloride  20 mEq Oral Daily  . rosuvastatin  10 mg Oral Daily   Continuous Infusions:  PRN Meds:.albuterol, ALPRAZolam, carisoprodol, HYDROmorphone, ondansetron (ZOFRAN) IV, simethicone, zolpidem  Allergies: No Known Allergies  Family History  Problem Relation Age of Onset  . Adopted: Yes    Social History:  reports that she quit smoking about 31 years ago. She has never used smokeless tobacco. She reports that she does not drink alcohol or use illicit drugs.  ROS: A complete review of systems was performed.  All systems are negative except for pertinent findings as noted.  Physical Exam:  Vital signs in last 24 hours: Temp:  [98 F (36.7 C)-98.4 F (36.9 C)] 98.1 F (36.7 C) (01/18 1330) Pulse Rate:  [90-96] 96  (01/18 1330) Resp:  [18-20] 18  (01/18 1330) BP: (106-147)/(67-78) 147/78 mmHg (01/18 1330) SpO2:  [92 %-99 %] 99 % (  01/18 1330) Weight:  [74.3 kg (163 lb 12.8 oz)] 74.3 kg (163 lb 12.8 oz) (01/18 0441) General:  Alert and oriented, No acute distress HEENT: Normocephalic, atraumatic Lungs: Normal respiratory effort GU: Urine grossly clear with indwelling catheter Extremities: No edema Neurologic: Grossly intact  Laboratory Data:    Results for orders placed during the hospital encounter of 01/12/11 (from the past 24 hour(s))  GLUCOSE, CAPILLARY     Status: Abnormal   Collection Time   01/19/11  9:07 PM      Component Value Range   Glucose-Capillary 134 (*) 70 - 99 (mg/dL)  GLUCOSE, CAPILLARY     Status: Abnormal   Collection Time   01/20/11  6:04 AM      Component Value Range   Glucose-Capillary 121 (*) 70 - 99 (mg/dL)  URINALYSIS, ROUTINE W REFLEX MICROSCOPIC     Status: Abnormal    Collection Time   01/20/11  1:37 PM      Component Value Range   Color, Urine AMBER (*) YELLOW    APPearance CLEAR  CLEAR    Specific Gravity, Urine 1.012  1.005 - 1.030    pH 6.5  5.0 - 8.0    Glucose, UA NEGATIVE  NEGATIVE (mg/dL)   Hgb urine dipstick NEGATIVE  NEGATIVE    Bilirubin Urine NEGATIVE  NEGATIVE    Ketones, ur NEGATIVE  NEGATIVE (mg/dL)   Protein, ur NEGATIVE  NEGATIVE (mg/dL)   Urobilinogen, UA 1.0  0.0 - 1.0 (mg/dL)   Nitrite POSITIVE (*) NEGATIVE    Leukocytes, UA TRACE (*) NEGATIVE   URINE MICROSCOPIC-ADD ON     Status: Normal   Collection Time   01/20/11  1:37 PM      Component Value Range   Squamous Epithelial / LPF RARE  RARE    WBC, UA 0-2  <3 (WBC/hpf)   Bacteria, UA RARE  RARE    Recent Results (from the past 240 hour(s))  URINE CULTURE     Status: Normal   Collection Time   01/12/11  5:44 PM      Component Value Range Status Comment   Specimen Description URINE, RANDOM   Final    Special Requests ADDED [id number]   Final    Setup Time [id number]   Final    Colony Count >=100,000 COLONIES/ML   Final    Culture ESCHERICHIA COLI   Final    Report Status 01/14/2011 FINAL   Final    Organism ID, Bacteria ESCHERICHIA COLI   Final   MRSA PCR SCREENING     Status: Normal   Collection Time   01/12/11 10:30 PM      Component Value Range Status Comment   MRSA by PCR NEGATIVE  NEGATIVE  Final   SURGICAL PCR SCREEN     Status: Abnormal   Collection Time   01/14/11  7:00 AM      Component Value Range Status Comment   MRSA, PCR NEGATIVE  NEGATIVE  Final    Staphylococcus aureus POSITIVE (*) NEGATIVE  Final     Renal Function:  Basename 01/17/11 0556 01/16/11 0330 01/15/11 1718 01/15/11 1715 01/15/11 0326 01/14/11 2018 01/14/11 2012  CREATININE 0.85 0.95 1.00 0.96 0.81 0.80 0.75   Estimated Creatinine Clearance: 63 ml/min (by C-G formula based on Cr of 0.85).  Radiologic Imaging: No results found.  I independently reviewed the above imaging  studies.  Impression/Plan:  1. Urinary retention: Likley multifactorial related to recent anesthetic and pain medications as well  as chronic pain medication use.  This is likely transient.  Since she has had 3 voiding trials so far, I recommend she be discharged home with a catheter and followup as an outpatient in about one week for a voiding trial. She can stop Pyridium.  Torsha Lemus,LES 01/20/2011, 6:33 PM  Moody Bruins. MD

## 2011-01-20 NOTE — Progress Notes (Addendum)
Nurse informed me that patient developed worsening bladder spams and has not voided since early this am.  I discussed with Dr. Donata Clay. We are going to bladder scan her and I contacted Dr. Valda Lamb office to evaluate her. Bladder scan revealed over 890 cc. UA obtained and then foley placed. Discharge to be cancelled for today.

## 2011-01-20 NOTE — Progress Notes (Signed)
Pt ambulated with walker 500 feet. No SOB. Tolerated well.

## 2011-01-20 NOTE — Progress Notes (Signed)
Subjective:  No CP or SOB  Objective:  Temp:  [98 F (36.7 C)-98.4 F (36.9 C)] 98 F (36.7 C) (01/18 0441) Pulse Rate:  [90-93] 90  (01/18 0441) Resp:  [18-20] 18  (01/18 0441) BP: (106-120)/(67-74) 106/67 mmHg (01/18 0441) SpO2:  [92 %] 92 % (01/18 0441) Weight:  [74.3 kg (163 lb 12.8 oz)] 74.3 kg (163 lb 12.8 oz) (01/18 0441) Weight change:   Intake/Output from previous day: 01/17 0701 - 01/18 0700 In: 1080 [P.O.:1080] Out: 1275 [Urine:1275]  Intake/Output from this shift:    Physical Exam: General appearance: alert and cooperative Neck: no adenopathy, no carotid bruit, no JVD, supple, symmetrical, trachea midline and thyroid not enlarged, symmetric, no tenderness/mass/nodules Lungs: clear to auscultation bilaterally Heart: regular rate and rhythm, S1, S2 normal, no murmur, click, rub or gallop Extremities: extremities normal, atraumatic, no cyanosis or edema  Lab Results: Results for orders placed during the hospital encounter of 01/12/11 (from the past 48 hour(s))  GLUCOSE, CAPILLARY     Status: Normal   Collection Time   01/18/11 11:29 AM      Component Value Range Comment   Glucose-Capillary 92  70 - 99 (mg/dL)    Comment 1 Documented in Chart     GLUCOSE, CAPILLARY     Status: Normal   Collection Time   01/18/11  4:38 PM      Component Value Range Comment   Glucose-Capillary 88  70 - 99 (mg/dL)    Comment 1 Documented in Chart      Comment 2 Notify RN     GLUCOSE, CAPILLARY     Status: Normal   Collection Time   01/18/11  8:50 PM      Component Value Range Comment   Glucose-Capillary 87  70 - 99 (mg/dL)    Comment 1 Documented in Chart      Comment 2 Notify RN     GLUCOSE, CAPILLARY     Status: Normal   Collection Time   01/19/11  6:48 AM      Component Value Range Comment   Glucose-Capillary 93  70 - 99 (mg/dL)   GLUCOSE, CAPILLARY     Status: Normal   Collection Time   01/19/11 11:46 AM      Component Value Range Comment   Glucose-Capillary 81  70 -  99 (mg/dL)    Comment 1 Documented in Chart      Comment 2 Notify RN     URINALYSIS, ROUTINE W REFLEX MICROSCOPIC     Status: Normal   Collection Time   01/19/11  2:00 PM      Component Value Range Comment   Color, Urine YELLOW  YELLOW     APPearance CLEAR  CLEAR     Specific Gravity, Urine 1.007  1.005 - 1.030     pH 6.0  5.0 - 8.0     Glucose, UA NEGATIVE  NEGATIVE (mg/dL)    Hgb urine dipstick NEGATIVE  NEGATIVE     Bilirubin Urine NEGATIVE  NEGATIVE     Ketones, ur NEGATIVE  NEGATIVE (mg/dL)    Protein, ur NEGATIVE  NEGATIVE (mg/dL)    Urobilinogen, UA 0.2  0.0 - 1.0 (mg/dL)    Nitrite NEGATIVE  NEGATIVE     Leukocytes, UA NEGATIVE  NEGATIVE  MICROSCOPIC NOT DONE ON URINES WITH NEGATIVE PROTEIN, BLOOD, LEUKOCYTES, NITRITE, OR GLUCOSE <1000 mg/dL.  GLUCOSE, CAPILLARY     Status: Abnormal   Collection Time   01/19/11  5:21 PM  Component Value Range Comment   Glucose-Capillary 129 (*) 70 - 99 (mg/dL)   GLUCOSE, CAPILLARY     Status: Abnormal   Collection Time   01/19/11  9:07 PM      Component Value Range Comment   Glucose-Capillary 134 (*) 70 - 99 (mg/dL)   GLUCOSE, CAPILLARY     Status: Abnormal   Collection Time   01/20/11  6:04 AM      Component Value Range Comment   Glucose-Capillary 121 (*) 70 - 99 (mg/dL)     Imaging: Imaging results have been reviewed  Assessment/Plan:   1. Principal Problem: 2.  *Crescendo angina, with EKG changes, new. 3. Active Problems: 4.  Back pain, chronic--plans were for cervical disc surgery week of 01/16/11 5.  Asthma 6.  Fibromyalgia 7.  S/P CABG x 3: (lima-lad,svg-om,svg-rca) 8.  CAD (coronary artery disease): multivessel, good LVF 50% at cath 9.  Urinary retention, foley placed 10.  Volume overload, post op 11.  Hyperglycemia, post op, no prior history of DM 12.  Anemia, post op 13.  NSVT (nonsustained ventricular tachycardia), short runs 1/15 14.   Post Op CABG. Ambulating w/o difficulty. Exam benign. Urinating now w/o  difficulty after treatment with pyridium. 2 D echo results pending to R/O embolic source given visual "TIA" 2 days ago. ROV with me 2-3 weeks. Time Spent Directly with Patient:  20 minutes  Length of Stay:  LOS: 8 days    Romen Yutzy J 01/20/2011, 8:04 AM

## 2011-01-20 NOTE — Progress Notes (Signed)
[id number] Cardiac Rehab On arrival pt states that she is exhausted from having a full bladder, wants to rest. She said that she has ambulated once today and will walk some more after she has rested.Marland Kitchen

## 2011-01-20 NOTE — Progress Notes (Deleted)
Pt. Discharged 01/20/2011  11:38 AM Discharge instructions reviewed with patient/family. Patient/family verbalized understanding. All Rx's given. Questions answered as needed. Pt. Discharged to home with family/self. Taken off unit via W/C. Elease Hashimoto RN

## 2011-01-20 NOTE — Plan of Care (Signed)
Problem: Phase III Progression Outcomes Goal: Other Phase III Outcomes/Goals Outcome: Progressing Pt progressing. Will go home with urinary catheter per MD note

## 2011-01-20 NOTE — Progress Notes (Addendum)
6 Days Post-Op Procedure(s) (LRB): CORONARY ARTERY BYPASS GRAFTING (CABG) (N/A)  Subjective: Patient with dome "shooting pains" near lower sternal wound. According to patient, she had a lot of difficulty voiding and was pushing hard on railing in bathroom. She attributes her pain to this as she did not have much sternal incisional pain previously.  Objective: Vital signs in last 24 hours: Patient Vitals for the past 24 hrs:  BP Temp Temp src Pulse Resp SpO2 Weight  01/20/11 0441 106/67 mmHg 98 F (36.7 C) Oral 90  18  92 % 163 lb 12.8 oz (74.3 kg)  01/19/11 2127 114/74 mmHg 98.4 F (36.9 C) Oral 93  20  92 % -  01/19/11 1530 120/70 mmHg - - - - - -   Pre op weight 75 kg Current Weight  01/20/11 163 lb 12.8 oz (74.3 kg)      Intake/Output from previous day: 01/17 0701 - 01/18 0700 In: 1080 [P.O.:1080] Out: 1275 [Urine:1275]   Physical Exam:  Cardiovascular: RRR, no murmurs, gallops, or rubs. Pulmonary: Slightly diminished at bases;no rales, wheezes, or rhonchi. Abdomen: Soft, non tender, bowel sounds present. Extremities: Trace bilateral lower extremity edema.Ecchymosis of right lower extremity. Wounds: Clean and dry.  No erythema or signs of infection.  Lab Results: CBC: No results found for this basename: WBC:2,HGB:2,HCT:2,PLT:2 in the last 72 hours BMET:  No results found for this basename: NA:2,K:2,CL:2,CO2:2,GLUCOSE:2,BUN:2,CREATININE:2,CALCIUM:2 in the last 72 hours  PT/INR: No results found for this basename: LABPROT,INR in the last 72 hours ABG:  INR: Will add last result for INR, ABG once components are confirmed Will add last 4 CBG results once components are confirmed  Assessment/Plan:  1. CV - SR. Continue Lopressor 12.5 tid and Plavix. Echo done 1/17. I spoke with Dr. Allyson Sabal who had Dr. Rennis Golden read the echo. There is no evidence of thrombus and EF is preserved. 2.  Pulmonary - Encourage incentive spirometer.  3. Volume Overload - Continue with diuresis for  a couple of days upon discharge then stop. 4.  Acute blood loss anemia - H/H slightly decreased to 8.8/26.7.Continue Nu Iron and folic acid. 5.Thrombocytopenia-Last platelet count  87,000. 6.Urinary retention-foley removed 1/17. Had to be in and out cath'd once. Started on Pyridium and  then able to void on own. 7.Probable discharge today.   ZIMMERMAN,DONIELLE MPA-C 01/20/2011

## 2011-01-20 NOTE — Progress Notes (Signed)
Pt ambulated 500 feet with walker. Pt tolerated well, no SOB.

## 2011-01-21 MED ORDER — CEFDINIR 125 MG/5ML PO SUSR: 250.0000 mg | Freq: Two times a day (BID) | ORAL | Status: AC

## 2011-01-21 MED ORDER — CEFDINIR 125 MG/5ML PO SUSR
250.0000 mg | Freq: Two times a day (BID) | ORAL | Status: DC
  Administered 2011-01-21: 250 mg via ORAL
  Filled 2011-01-21 (×2): qty 10

## 2011-01-21 NOTE — Progress Notes (Signed)
Pt ambulated 2 times during day shift per report. Pt did not feel like ambulating tonight for a third time today.

## 2011-01-21 NOTE — Progress Notes (Signed)
[address]            [place] [address]          [phone]     7 Days Post-Op  Procedure(s) (LRB): CORONARY ARTERY BYPASS GRAFTING (CABG) (N/A) Subjective: Feels ok, foley in place with good urine output   Objective  Telemetry NSR, PVC's  Temp:  [98.1 F (36.7 C)-99 F (37.2 C)] 98.3 F (36.8 C) (01/19 0333) Pulse Rate:  [86-97] 86  (01/19 0333) Resp:  [18] 18  (01/19 0333) BP: (109-147)/(66-78) 109/69 mmHg (01/19 0333) SpO2:  [91 %-99 %] 95 % (01/19 0333) Weight:  [161 lb 6 oz (73.2 kg)] 161 lb 6 oz (73.2 kg) (01/19 0333)   Intake/Output Summary (Last 24 hours) at 01/21/11 0752 Last data filed at 01/20/11 2112  Gross per 24 hour  Intake    720 ml  Output   2701 ml  Net  -1981 ml       General appearance: alert, cooperative and no distress Heart: regular rate and rhythm and S1, S2 normal Lungs: clear to auscultation bilaterally Abdomen: soft, no TTP, nondistended Extremities: no edema Wound: incisions healing well  Lab Results: No results found for this basename: NA:2,K:2,CL:2,CO2:2,GLUCOSE:2,BUN:2,CREATININE:2,CALCIUM:2,MG:2,PHOS:2 in the last 72 hours No results found for this basename: AST:2,ALT:2,ALKPHOS:2,BILITOT:2,PROT:2,ALBUMIN:2 in the last 72 hours No results found for this basename: LIPASE:2,AMYLASE:2 in the last 72 hours No results found for this basename: WBC:2,NEUTROABS:2,HGB:2,HCT:2,MCV:2,PLT:2 in the last 72 hours No results found for this basename: CKTOTAL:4,CKMB:4,TROPONINI:4 in the last 72 hours No components found with this basename: POCBNP:3 No results found for this basename: DDIMER in the last 72 hours No results found for this basename: HGBA1C in the last 72 hours No results found for this basename: CHOL,HDL,LDLCALC,TRIG,CHOLHDL in the last 72 hours No results found for this basename: TSH,T4TOTAL,FREET3,T3FREE,THYROIDAB in the last 72 hours No results found for this basename:  VITAMINB12,FOLATE,FERRITIN,TIBC,IRON,RETICCTPCT in the last 72 hours  Radiology/Studies:  No results found.  INR: Will add last result for INR, ABG once components are confirmed Will add last 4 CBG results once components are confirmed  Assessment/Plan: S/P Procedure(s) (LRB): CORONARY ARTERY BYPASS GRAFTING (CABG) (N/A) 1. Doing well, stable for d/c with foley , on omnicef, pyridium d/c'd. No other changes currnt meds:  Medication List  As of 01/21/2011  7:59 AM   TAKE these medications         albuterol 108 (90 BASE) MCG/ACT inhaler   Commonly known as: PROVENTIL HFA;VENTOLIN HFA   Inhale 1 puff into the lungs every 6 (six) hours as needed. For shortness of breath      aspirin 81 MG EC tablet   Take 1 tablet (81 mg total) by mouth daily.      CALCIUM + D PO   Take 1 tablet by mouth daily.      carisoprodol 350 MG tablet   Commonly known as: SOMA   Take 350 mg by mouth 4 (four) times daily as needed.      cefdinir 125 MG/5ML suspension   Commonly known as: OMNICEF   Take 10 mLs (250 mg total) by mouth 2 (two) times daily.      clopidogrel 75 MG tablet   Commonly known as: PLAVIX   Take 1 tablet (75 mg total) by mouth daily with breakfast.      fentaNYL 50 MCG/HR   Commonly known as: DURAGESIC -  dosed mcg/hr   Place 1 patch onto the skin every 3 (three) days.      fluticasone-salmeterol 230-21 MCG/ACT inhaler   Commonly known as: ADVAIR HFA   Inhale 2 puffs into the lungs 2 (two) times daily.      folic acid 1 MG tablet   Commonly known as: FOLVITE   Take 1 tablet (1 mg total) by mouth daily. For one month then stop.      furosemide 40 MG tablet   Commonly known as: LASIX   Take 1 tablet (40 mg total) by mouth daily. For 3 days then stop.      gabapentin 600 MG tablet   Commonly known as: NEURONTIN   Take 600 mg by mouth 4 (four) times daily.      HYDROmorphone 4 MG tablet   Commonly known as: DILAUDID   Take 1 tablet (4 mg total) by mouth every 6 (six)  hours as needed for pain.      hydroxychloroquine 200 MG tablet   Commonly known as: PLAQUENIL   Take 200 mg by mouth daily.      metoprolol tartrate 12.5 mg Tabs   Commonly known as: LOPRESSOR   Take 0.5 tablets (12.5 mg total) by mouth 3 (three) times daily.      montelukast 10 MG tablet   Commonly known as: SINGULAIR   Take 10 mg by mouth daily.      mulitivitamin with minerals Tabs   Take 1 tablet by mouth daily.      polysaccharide iron 150 MG Caps capsule   Commonly known as: NIFEREX   Take 1 capsule (150 mg total) by mouth daily. For one month then stop.      potassium chloride SA 20 MEQ tablet   Commonly known as: K-DUR,KLOR-CON   Take 1 tablet (20 mEq total) by mouth daily. For 3 days then stop.      rosuvastatin 10 MG tablet   Commonly known as: CRESTOR   Take 1 tablet (10 mg total) by mouth daily.      Vitamin D 2000 UNITS Caps   Take 2,000 Units by mouth daily.             LOS: 9 days    [name],[name] 1/19/20137:53 AM

## 2011-01-21 NOTE — Progress Notes (Signed)
Pt. Discharged 01/21/2011  11:11 AM Discharge instructions reviewed with patient/family. Patient/family verbalized understanding. All Rx's given. Questions answered as needed. Pt. Discharged to home with family/self. Taken off unit via W/C. Elease Hashimoto RN

## 2011-01-21 NOTE — Progress Notes (Signed)
Subjective:  No Cp or SOB  Objective:  Temp:  [98.1 F (36.7 C)-99 F (37.2 C)] 98.3 F (36.8 C) (01/19 0333) Pulse Rate:  [86-97] 86  (01/19 0333) Resp:  [18] 18  (01/19 0333) BP: (109-147)/(66-78) 109/69 mmHg (01/19 0333) SpO2:  [91 %-99 %] 95 % (01/19 0333) Weight:  [73.2 kg (161 lb 6 oz)] 73.2 kg (161 lb 6 oz) (01/19 0333) Weight change: -1.1 kg (-2 lb 6.8 oz)  Intake/Output from previous day: 01/18 0701 - 01/19 0700 In: 960 [P.O.:960] Out: 2701 [Urine:2700; Stool:1]  Intake/Output from this shift:    Physical Exam: General appearance: alert and cooperative Neck: no adenopathy, no carotid bruit, no JVD, supple, symmetrical, trachea midline and thyroid not enlarged, symmetric, no tenderness/mass/nodules Lungs: clear to auscultation bilaterally Heart: regular rate and rhythm, S1, S2 normal, no murmur, click, rub or gallop Extremities: extremities normal, atraumatic, no cyanosis or edema  Lab Results: Results for orders placed during the hospital encounter of 01/12/11 (from the past 48 hour(s))  GLUCOSE, CAPILLARY     Status: Normal   Collection Time   01/19/11 11:46 AM      Component Value Range Comment   Glucose-Capillary 81  70 - 99 (mg/dL)    Comment 1 Documented in Chart      Comment 2 Notify RN     URINALYSIS, ROUTINE W REFLEX MICROSCOPIC     Status: Normal   Collection Time   01/19/11  2:00 PM      Component Value Range Comment   Color, Urine YELLOW  YELLOW     APPearance CLEAR  CLEAR     Specific Gravity, Urine 1.007  1.005 - 1.030     pH 6.0  5.0 - 8.0     Glucose, UA NEGATIVE  NEGATIVE (mg/dL)    Hgb urine dipstick NEGATIVE  NEGATIVE     Bilirubin Urine NEGATIVE  NEGATIVE     Ketones, ur NEGATIVE  NEGATIVE (mg/dL)    Protein, ur NEGATIVE  NEGATIVE (mg/dL)    Urobilinogen, UA 0.2  0.0 - 1.0 (mg/dL)    Nitrite NEGATIVE  NEGATIVE     Leukocytes, UA NEGATIVE  NEGATIVE  MICROSCOPIC NOT DONE ON URINES WITH NEGATIVE PROTEIN, BLOOD, LEUKOCYTES, NITRITE, OR  GLUCOSE <1000 mg/dL.  GLUCOSE, CAPILLARY     Status: Abnormal   Collection Time   01/19/11  5:21 PM      Component Value Range Comment   Glucose-Capillary 129 (*) 70 - 99 (mg/dL)   GLUCOSE, CAPILLARY     Status: Abnormal   Collection Time   01/19/11  9:07 PM      Component Value Range Comment   Glucose-Capillary 134 (*) 70 - 99 (mg/dL)   GLUCOSE, CAPILLARY     Status: Abnormal   Collection Time   01/20/11  6:04 AM      Component Value Range Comment   Glucose-Capillary 121 (*) 70 - 99 (mg/dL)   URINALYSIS, ROUTINE W REFLEX MICROSCOPIC     Status: Abnormal   Collection Time   01/20/11  1:37 PM      Component Value Range Comment   Color, Urine AMBER (*) YELLOW  BIOCHEMICALS MAY BE AFFECTED BY COLOR   APPearance CLEAR  CLEAR     Specific Gravity, Urine 1.012  1.005 - 1.030     pH 6.5  5.0 - 8.0     Glucose, UA NEGATIVE  NEGATIVE (mg/dL)    Hgb urine dipstick NEGATIVE  NEGATIVE     Bilirubin  Urine NEGATIVE  NEGATIVE     Ketones, ur NEGATIVE  NEGATIVE (mg/dL)    Protein, ur NEGATIVE  NEGATIVE (mg/dL)    Urobilinogen, UA 1.0  0.0 - 1.0 (mg/dL)    Nitrite POSITIVE (*) NEGATIVE     Leukocytes, UA TRACE (*) NEGATIVE    URINE MICROSCOPIC-ADD ON     Status: Normal   Collection Time   01/20/11  1:37 PM      Component Value Range Comment   Squamous Epithelial / LPF RARE  RARE     WBC, UA 0-2  <3 (WBC/hpf)    Bacteria, UA RARE  RARE      Imaging: Imaging results have been reviewed  Assessment/Plan:   1. Principal Problem: 2.  *Crescendo angina, with EKG changes, new. 3. Active Problems: 4.  Back pain, chronic--plans were for cervical disc surgery week of 01/16/11 5.  Asthma 6.  Fibromyalgia 7.  S/P CABG x 3: (lima-lad,svg-om,svg-rca) 8.  CAD (coronary artery disease): multivessel, good LVF 50% at cath 9.  Urinary retention, foley placed 10.  Volume overload, post op 11.  Hyperglycemia, post op, no prior history of DM 12.  Anemia, post op 13.  NSVT (nonsustained ventricular  tachycardia), short runs 1/15 14.   Time Spent Directly with Patient:  20 minutes  Length of Stay:  LOS: 9 days   S/P CABG. Was supposed to be D/Cd yesterday but once again had inability to void and ultimately required reinsertion of foley. Dr. Laverle Patter saw her for Urology and put on ATBX. Prob home today. ROV with me 2-3 weeks.   [name] [name] 01/21/2011, 8:15 AM

## 2011-01-23 NOTE — Discharge Summary (Signed)
patient examined and medical record reviewed,agree with above note. VAN TRIGT III,[name] 01/23/2011    

## 2011-01-24 ENCOUNTER — Other Ambulatory Visit: Payer: Self-pay | Admitting: Family Medicine

## 2011-02-07 ENCOUNTER — Other Ambulatory Visit: Payer: Self-pay | Admitting: Cardiothoracic Surgery

## 2011-02-07 DIAGNOSIS — I2510 Atherosclerotic heart disease of native coronary artery without angina pectoris: Secondary | ICD-10-CM

## 2011-02-08 ENCOUNTER — Ambulatory Visit
Admission: RE | Admit: 2011-02-08 | Discharge: 2011-02-08 | Disposition: A | Discharge status: Home / Self Care | Payer: PRIVATE HEALTH INSURANCE | Source: Ambulatory Visit | Attending: Cardiothoracic Surgery | Admitting: Cardiothoracic Surgery

## 2011-02-08 ENCOUNTER — Encounter: Payer: Self-pay | Admitting: Cardiothoracic Surgery

## 2011-02-08 ENCOUNTER — Ambulatory Visit (INDEPENDENT_AMBULATORY_CARE_PROVIDER_SITE_OTHER): Payer: Self-pay | Admitting: Cardiothoracic Surgery

## 2011-02-08 VITALS — BP 122/79 | HR 95 | Resp 16 | Ht 65.0 in | Wt 164.0 lb

## 2011-02-08 DIAGNOSIS — I2510 Atherosclerotic heart disease of native coronary artery without angina pectoris: Secondary | ICD-10-CM

## 2011-02-08 DIAGNOSIS — Z09 Encounter for follow-up examination after completed treatment for conditions other than malignant neoplasm: Secondary | ICD-10-CM

## 2011-02-08 NOTE — Patient Instructions (Signed)
Finish iron tablets Start cardiac rehab, you may drive

## 2011-02-08 NOTE — Progress Notes (Signed)
HPI:                     [address]            [name] [address]          [phone]    The patient returns for her first postoperative visit 3 weeks after CABG x3 for a non-stem he MI and three-vessel disease. She is doing well without recurrent angina. The surgical incisions are healing well. She is ready to start outpatient cardiac rehabilitation.  Current Outpatient Prescriptions  Medication Sig Dispense Refill  . albuterol (PROVENTIL HFA;VENTOLIN HFA) 108 (90 BASE) MCG/ACT inhaler Inhale 1 puff into the lungs every 6 (six) hours as needed. For shortness of breath      . aspirin EC 81 MG EC tablet Take 1 tablet (81 mg total) by mouth daily.      . Calcium Carbonate-Vitamin D (CALCIUM + D PO) Take 1 tablet by mouth daily.        . carisoprodol (SOMA) 350 MG tablet Take 350 mg by mouth 4 (four) times daily as needed.        . Cholecalciferol (VITAMIN D) 2000 UNITS CAPS Take 2,000 Units by mouth daily.      . clopidogrel (PLAVIX) 75 MG tablet Take 1 tablet (75 mg total) by mouth daily with breakfast.  30 tablet  1  . fentaNYL (DURAGESIC - DOSED MCG/HR) 50 MCG/HR Place 1 patch onto the skin every 3 (three) days.        . fluticasone-salmeterol (ADVAIR HFA) 230-21 MCG/ACT inhaler Inhale 2 puffs into the lungs 2 (two) times daily.        . folic acid (FOLVITE) 1 MG tablet Take 1 tablet (1 mg total) by mouth daily. For one month then stop.  30 tablet  0  . gabapentin (NEURONTIN) 600 MG tablet Take 600 mg by mouth 4 (four) times daily.       . hydroxychloroquine (PLAQUENIL) 200 MG tablet Take 200 mg by mouth daily.       . metoprolol tartrate (LOPRESSOR) 12.5 mg TABS Take 0.5 tablets (12.5 mg total) by mouth 3 (three) times daily.  45 each  1  . montelukast (SINGULAIR) 10 MG tablet TAKE 1 TABLET ONCE A DAY  90 tablet  3  . Multiple Vitamin (MULITIVITAMIN WITH MINERALS) TABS Take 1 tablet by mouth daily.      . rosuvastatin (CRESTOR) 10 MG tablet Take 1 tablet (10 mg total) by  mouth daily.  30 tablet  1  . furosemide (LASIX) 40 MG tablet Take 1 tablet (40 mg total) by mouth daily. For 3 days then stop.  3 tablet  0  . polysaccharide iron (NIFEREX) 150 MG CAPS capsule Take 1 capsule (150 mg total) by mouth daily. For one month then stop.  30 each  0  . potassium chloride SA (K-DUR,KLOR-CON) 20 MEQ tablet Take 1 tablet (20 mEq total) by mouth daily. For 3 days then stop.  3 tablet  0     Physical Exam: Vital signs blood pressure 120/80 pulse 90 per minute regular saturation on room air 95% weight 164 Breath sounds clear bilaterally, chest incision well-healed. Cardiac rhythm regular without gallop or murmur Extremities without edema Neuro intact  Diagnostic Tests: Chest x-ray with clear lung fields, no pleural effusion, sternal wires intact, no edema  Impression: Stable course 3 weeks following surgery. She'll continue her Plavix for 4 weeks following surgery. She will maintain  aspirin indefinitely as well as a statin (Crestor) she is clear to start rehabilitation. She knows not to lift more than 20 pounds until at least 3 months after surgery.  Plan: Followup with her cardiologist Dr. Allyson Sabal. Return to this office as needed.

## 2011-02-09 ENCOUNTER — Encounter (HOSPITAL_COMMUNITY): Payer: Self-pay

## 2011-02-09 ENCOUNTER — Encounter (HOSPITAL_COMMUNITY)
Admission: RE | Admit: 2011-02-09 | Discharge: 2011-02-09 | Disposition: A | Discharge status: Home / Self Care | Payer: PRIVATE HEALTH INSURANCE | Source: Ambulatory Visit | Attending: Cardiovascular Disease | Admitting: Cardiovascular Disease

## 2011-02-09 DIAGNOSIS — I2510 Atherosclerotic heart disease of native coronary artery without angina pectoris: Secondary | ICD-10-CM | POA: Insufficient documentation

## 2011-02-09 DIAGNOSIS — M899 Disorder of bone, unspecified: Secondary | ICD-10-CM | POA: Insufficient documentation

## 2011-02-09 DIAGNOSIS — M549 Dorsalgia, unspecified: Secondary | ICD-10-CM | POA: Insufficient documentation

## 2011-02-09 DIAGNOSIS — Z981 Arthrodesis status: Secondary | ICD-10-CM | POA: Insufficient documentation

## 2011-02-09 DIAGNOSIS — I472 Ventricular tachycardia, unspecified: Secondary | ICD-10-CM | POA: Insufficient documentation

## 2011-02-09 DIAGNOSIS — I200 Unstable angina: Secondary | ICD-10-CM | POA: Insufficient documentation

## 2011-02-09 DIAGNOSIS — I4729 Other ventricular tachycardia: Secondary | ICD-10-CM | POA: Insufficient documentation

## 2011-02-09 DIAGNOSIS — I10 Essential (primary) hypertension: Secondary | ICD-10-CM | POA: Insufficient documentation

## 2011-02-09 DIAGNOSIS — J45909 Unspecified asthma, uncomplicated: Secondary | ICD-10-CM | POA: Insufficient documentation

## 2011-02-09 DIAGNOSIS — Z5189 Encounter for other specified aftercare: Secondary | ICD-10-CM | POA: Insufficient documentation

## 2011-02-09 DIAGNOSIS — Z951 Presence of aortocoronary bypass graft: Secondary | ICD-10-CM | POA: Insufficient documentation

## 2011-02-09 DIAGNOSIS — Z87891 Personal history of nicotine dependence: Secondary | ICD-10-CM | POA: Insufficient documentation

## 2011-02-09 DIAGNOSIS — Z7982 Long term (current) use of aspirin: Secondary | ICD-10-CM | POA: Insufficient documentation

## 2011-02-09 DIAGNOSIS — G8929 Other chronic pain: Secondary | ICD-10-CM | POA: Insufficient documentation

## 2011-02-09 DIAGNOSIS — Z79899 Other long term (current) drug therapy: Secondary | ICD-10-CM | POA: Insufficient documentation

## 2011-02-15 ENCOUNTER — Encounter (HOSPITAL_COMMUNITY)
Admission: RE | Admit: 2011-02-15 | Discharge: 2011-02-15 | Disposition: A | Discharge status: Home / Self Care | Payer: PRIVATE HEALTH INSURANCE | Source: Ambulatory Visit | Attending: Cardiovascular Disease | Admitting: Cardiovascular Disease

## 2011-02-15 NOTE — Progress Notes (Signed)
Pt started cardiac rehab today.  Pt tolerated light exercise without difficulty.  Pt denies chest pain or dyspnea with exertion.  VSS. Telemetry-sinus rhythm with frequent PVC during exercise.  Rhythm strip will be forwarded to Dr. Allyson Sabal for review.  Pt oriented to exercise equipment and routine.  Understanding verbalized.Will continue to monitor the patient throughout  the program.-jr,rn

## 2011-02-16 NOTE — Progress Notes (Signed)
[name] Long 71 y.o. female       Nutrition Screen                                                                    YES  NO Do you live in a nursing home?  X   Do you eat out more than 3 times/week?    X If yes, how many times per week do you eat out?  Do you have food allergies?   X If yes, what are you allergic to?  Have you gained or lost more than 10 lbs without trying?               X If yes, how much weight have you lost and over what time period?  lbs gained or lost over  weeks/month  Do you want to lose weight?    X  If yes, what is a goal weight or amount of weight you would like to lose?  lb  Do you eat alone most of the time?   X   Do you eat less than 2 meals/day?  X If yes, how many meals do you eat?  Do you drink more than 3 alcohol drinks/day?  X If yes, how many drinks per day?  Are you having trouble with constipation? *  X If yes, what are you doing to help relieve constipation?  Do you have financial difficulties with buying food?*    X   Are you experiencing regular nausea/ vomiting?*     X   Do you have a poor appetite? *                                        X   Do you have trouble chewing/swallowing? *   X    Pt with diagnoses of:  X CABG              X Dyslipidemia  / HDL< 40 / LDL>70 / High TG      X %  Body fat >goal / Body Mass Index >25 X HTN / BP >120/80 X Pre-diabetes       Pt Risk Score   1       Diagnosis Risk Score  25       Total Risk Score   26                         High Risk               X Low Risk    HT: 65.25" Ht Readings from Last 1 Encounters:  02/09/11 5' 5.25" (1.657 m)    WT:   161.7 lb (73.5 kg) Wt Readings from Last 3 Encounters:  02/09/11 162 lb 0.6 oz (73.5 kg)  02/08/11 164 lb (74.39 kg)  01/21/11 161 lb 6 oz (73.2 kg)     IBW 57.4 128%IBW BMI 26.8 37.9%body fat  Meds reviewed: MVI, Fish oil, vitamin D, Folic Acid, Ferrex Past Medical History  Diagnosis Date  . Allergy     RHINITIS  . Asthma   . Fibromyalgia   .  Fibromyalgia   . Chronic low back pain   . Osteoporosis     OSTEOPENIA  . PPD positive   . Connective tissue disease   . Urinary incontinence   . Hypertension   . Crescendo angina, with EKG changes, new. 01/12/2011  . Abnormal EKG 01/12/2011  . Back pain, chronic--plans were for cervical disc surgery week of 01/16/11 01/12/2011  . Asthma 01/12/2011        Activity level: Pt is active  Wt goal: 138-150 lb ( 62.7-68.2 kg) Current tobacco use? No      Food/Drug Interaction? No       Labs:  Lipid Panel     Component Value Date/Time   CHOL 238* 01/12/2011 1718   TRIG 98 01/12/2011 1718   HDL 110 01/12/2011 1718   CHOLHDL 2.2 01/12/2011 1718   VLDL 20 01/12/2011 1718   LDLCALC 108* 01/12/2011 1718   Lab Results  Component Value Date   HGBA1C 5.7* 01/12/2011   01/17/11 Glucose 121  LDL goal: < 100      MI, DM, Carotid or PVD and > 2: HTN, >70 yo female Estimated Daily Nutrition Needs for: ? wt loss  1200-1400 Kcal , Total Fat 30-35gm, Saturated Fat 8-11 gm, Trans Fat 1.1-1.4 gm,  Sodium less than 1500 mg

## 2011-02-17 ENCOUNTER — Encounter (HOSPITAL_COMMUNITY)
Admission: RE | Admit: 2011-02-17 | Discharge: 2011-02-17 | Disposition: A | Discharge status: Home / Self Care | Payer: PRIVATE HEALTH INSURANCE | Source: Ambulatory Visit | Attending: Cardiovascular Disease | Admitting: Cardiovascular Disease

## 2011-02-22 ENCOUNTER — Encounter (HOSPITAL_COMMUNITY)
Admission: RE | Admit: 2011-02-22 | Discharge: 2011-02-22 | Disposition: A | Discharge status: Home / Self Care | Payer: PRIVATE HEALTH INSURANCE | Source: Ambulatory Visit | Attending: Cardiovascular Disease | Admitting: Cardiovascular Disease

## 2011-02-24 ENCOUNTER — Encounter (HOSPITAL_COMMUNITY)
Admission: RE | Admit: 2011-02-24 | Discharge: 2011-02-24 | Disposition: A | Discharge status: Home / Self Care | Payer: PRIVATE HEALTH INSURANCE | Source: Ambulatory Visit | Attending: Cardiovascular Disease | Admitting: Cardiovascular Disease

## 2011-02-24 NOTE — Progress Notes (Signed)
Reviewed home exercise with pt today.  Pt plans to walk, use stationary bike, and use treadmill at home for exercise.  Reviewed THR, pulse, RPE, sign and symptoms, and when to call 911 or MD.  Pt voiced understanding. Electronically signed by Harriett Sine MS on Friday February 24 2011 at [phone] EST

## 2011-02-24 NOTE — Progress Notes (Signed)
[name] Long 71 y.o. female Nutrition Note Spoke with pt.  Nutrition Plan and Nutrition Survey reviewed with pt. Pt is following Step 2 of the Therapeutic Lifestyle Changes diet. Weight loss tips and Pre-diabetes discussed. Pt expressed understanding. Nutrition Diagnosis   Food-and nutrition-related knowledge deficit related to lack of exposure to information as related to diagnosis of: ? CVD ? Pre-DM (A1c 5.7)    Overweight related to excessive energy intake as evidenced by a BMI of 26.8 Nutrition RX/ Estimated Daily Nutrition Needs for: wt loss 1200-1400 Kcal, 30-35 gm fat, 8-11 gm sat fat, 1.1-1.4 gm trans-fat, <1500 mg sodium  Nutrition Intervention   Pt's individual nutrition plan including cholesterol goals reviewed with pt.   Benefits of adopting Therapeutic Lifestyle Changes discussed when Medficts reviewed.   Pt to attend the Portion Distortion class   Pt to attend the  ? Nutrition I class                          ? Nutrition II class    Pt given handouts for: ? wt loss ? pre-DM   Continue client-centered nutrition education by RD, as part of interdisciplinary care. Goal(s)   Pt to identify food quantities necessary to achieve: ? wt loss to a goal wt of 138-150 lb (62.7-68.2 kg) at graduation from cardiac rehab.  Monitor and Evaluate progress toward nutrition goal with team.

## 2011-03-01 ENCOUNTER — Encounter (HOSPITAL_COMMUNITY)
Admission: RE | Admit: 2011-03-01 | Discharge: 2011-03-01 | Disposition: A | Discharge status: Home / Self Care | Payer: PRIVATE HEALTH INSURANCE | Source: Ambulatory Visit | Attending: Cardiovascular Disease | Admitting: Cardiovascular Disease

## 2011-03-03 ENCOUNTER — Encounter (HOSPITAL_COMMUNITY)
Admission: RE | Admit: 2011-03-03 | Discharge: 2011-03-03 | Disposition: A | Discharge status: Home / Self Care | Payer: PRIVATE HEALTH INSURANCE | Source: Ambulatory Visit | Attending: Cardiovascular Disease | Admitting: Cardiovascular Disease

## 2011-03-03 DIAGNOSIS — Z951 Presence of aortocoronary bypass graft: Secondary | ICD-10-CM | POA: Insufficient documentation

## 2011-03-03 DIAGNOSIS — G8929 Other chronic pain: Secondary | ICD-10-CM | POA: Insufficient documentation

## 2011-03-03 DIAGNOSIS — M899 Disorder of bone, unspecified: Secondary | ICD-10-CM | POA: Insufficient documentation

## 2011-03-03 DIAGNOSIS — J45909 Unspecified asthma, uncomplicated: Secondary | ICD-10-CM | POA: Insufficient documentation

## 2011-03-03 DIAGNOSIS — Z7982 Long term (current) use of aspirin: Secondary | ICD-10-CM | POA: Insufficient documentation

## 2011-03-03 DIAGNOSIS — Z5189 Encounter for other specified aftercare: Secondary | ICD-10-CM | POA: Insufficient documentation

## 2011-03-03 DIAGNOSIS — I472 Ventricular tachycardia, unspecified: Secondary | ICD-10-CM | POA: Insufficient documentation

## 2011-03-03 DIAGNOSIS — I10 Essential (primary) hypertension: Secondary | ICD-10-CM | POA: Insufficient documentation

## 2011-03-03 DIAGNOSIS — Z87891 Personal history of nicotine dependence: Secondary | ICD-10-CM | POA: Insufficient documentation

## 2011-03-03 DIAGNOSIS — Z981 Arthrodesis status: Secondary | ICD-10-CM | POA: Insufficient documentation

## 2011-03-03 DIAGNOSIS — I200 Unstable angina: Secondary | ICD-10-CM | POA: Insufficient documentation

## 2011-03-03 DIAGNOSIS — I2510 Atherosclerotic heart disease of native coronary artery without angina pectoris: Secondary | ICD-10-CM | POA: Insufficient documentation

## 2011-03-03 DIAGNOSIS — Z79899 Other long term (current) drug therapy: Secondary | ICD-10-CM | POA: Insufficient documentation

## 2011-03-03 DIAGNOSIS — M949 Disorder of cartilage, unspecified: Secondary | ICD-10-CM | POA: Insufficient documentation

## 2011-03-03 DIAGNOSIS — I4729 Other ventricular tachycardia: Secondary | ICD-10-CM | POA: Insufficient documentation

## 2011-03-03 DIAGNOSIS — M549 Dorsalgia, unspecified: Secondary | ICD-10-CM | POA: Insufficient documentation

## 2011-03-06 ENCOUNTER — Encounter (HOSPITAL_COMMUNITY)
Admission: RE | Admit: 2011-03-06 | Discharge: 2011-03-06 | Disposition: A | Discharge status: Home / Self Care | Payer: PRIVATE HEALTH INSURANCE | Source: Ambulatory Visit | Attending: Cardiovascular Disease | Admitting: Cardiovascular Disease

## 2011-03-08 ENCOUNTER — Encounter (HOSPITAL_COMMUNITY)
Admission: RE | Admit: 2011-03-08 | Discharge: 2011-03-08 | Disposition: A | Discharge status: Home / Self Care | Payer: PRIVATE HEALTH INSURANCE | Source: Ambulatory Visit | Attending: Cardiovascular Disease | Admitting: Cardiovascular Disease

## 2011-03-10 ENCOUNTER — Encounter (HOSPITAL_COMMUNITY): Payer: PRIVATE HEALTH INSURANCE

## 2011-03-15 ENCOUNTER — Encounter (HOSPITAL_COMMUNITY)
Admission: RE | Admit: 2011-03-15 | Discharge: 2011-03-15 | Disposition: A | Discharge status: Home / Self Care | Payer: PRIVATE HEALTH INSURANCE | Source: Ambulatory Visit | Attending: Cardiovascular Disease | Admitting: Cardiovascular Disease

## 2011-03-16 NOTE — Progress Notes (Addendum)
Cardiac Rehabilitation Program Progress Report   Orientation:  02/09/2011 Graduate Date:   Discharge Date:  03/15/2011 # of sessions completed: 9  Cardiologist: Wilhemina Cash MD:  Matthew Saras Time:  0815  A.  Exercise Program:  Tolerates exercise @ 3.7 METS for 30 minutes and Discharged to home exercise program.  Anticipated compliance:  fair  B.  Mental Health:  Upon initial assessment, pt had very positive quality of life scores and positive psychosocial assessment with positive support resources.   C.  Education/Instruction/Skills  Accurately checks own pulse, Knows THR for exercise, Uses Perceived Exertion Scale and Attended 6 education classes  D.  Nutrition/Weight Control/Body Composition:  Patient has gained .2 kg BMI 26.8  *This section completed by Mickle Plumb, Andres Shad, RD, LDN, CDE  E.  Blood Lipids    Lab Results  Component Value Date   CHOL 238* 01/12/2011     Lab Results  Component Value Date   TRIG 98 01/12/2011     Lab Results  Component Value Date   HDL 110 01/12/2011     Lab Results  Component Value Date   CHOLHDL 2.2 01/12/2011     No results found for this basename: LDLDIRECT      F.  Lifestyle Changes:  Pt plans to exercise on her own.      G.  Symptoms noted with exercise:  Asymptomatic  Report Completed By:  Dayton Martes   Comments:  Pt completed 9 sessions due to insurance co-pay. Electronically signed by Harriett Sine MS on Thursday March 16 2011  Pt completed cardiac rehab after attending 9 sessions. Pt had excellent participation with exercise and education classes.  Pt hypotensive pre and post exercise on several occasions, aysmptomatic, lowest BP-86/54.   Telemetry-NSR, frequent PVCs.  Report faxed to Dr.Berry with VS and rhythm strip.  Pt making positive lifestyle changes and will need physician encouragement to continue.pt did not have hospital admission during cardiac rehab.   Pt was a pleasure to  work with. Thank you for the referral.  Deveron Furlong, RN Cardiac Pulmonary Rehab

## 2011-03-17 ENCOUNTER — Encounter (HOSPITAL_COMMUNITY): Payer: PRIVATE HEALTH INSURANCE

## 2011-03-21 ENCOUNTER — Encounter: Payer: Self-pay | Admitting: Internal Medicine

## 2011-03-21 LAB — HM MAMMOGRAPHY

## 2011-03-23 NOTE — Progress Notes (Signed)
Addendum to Nutrition Section of Cardiac Rehab Program Progress Report  Pt following a step 2 Therapeutic Lifestyle Changes diet on admission to Cardiac Rehab. No post-rehab information available to evaluate.

## 2011-04-03 DIAGNOSIS — Z9289 Personal history of other medical treatment: Secondary | ICD-10-CM

## 2011-04-03 HISTORY — DX: Personal history of other medical treatment: Z92.89

## 2011-05-11 ENCOUNTER — Telehealth: Payer: Self-pay | Admitting: Family Medicine

## 2011-05-11 NOTE — Telephone Encounter (Signed)
LM

## 2011-05-24 ENCOUNTER — Ambulatory Visit
Admission: RE | Admit: 2011-05-24 | Discharge: 2011-05-24 | Disposition: A | Discharge status: Home / Self Care | Payer: PRIVATE HEALTH INSURANCE | Source: Ambulatory Visit | Attending: Anesthesiology | Admitting: Anesthesiology

## 2011-05-24 ENCOUNTER — Other Ambulatory Visit: Payer: Self-pay | Admitting: Anesthesiology

## 2011-05-24 DIAGNOSIS — M25559 Pain in unspecified hip: Secondary | ICD-10-CM

## 2011-06-19 ENCOUNTER — Ambulatory Visit (INDEPENDENT_AMBULATORY_CARE_PROVIDER_SITE_OTHER): Payer: PRIVATE HEALTH INSURANCE | Admitting: Family Medicine

## 2011-06-19 VITALS — BP 140/80 | HR 100 | Wt 160.0 lb

## 2011-06-19 DIAGNOSIS — M545 Low back pain, unspecified: Secondary | ICD-10-CM

## 2011-06-19 DIAGNOSIS — M79605 Pain in left leg: Secondary | ICD-10-CM

## 2011-06-19 NOTE — Progress Notes (Signed)
  Subjective:    Patient ID: [name] Long, female    DOB: 09/19/1940, 71 y.o.   MRN: [id number]  HPI He is here for evaluation of back and leg pain. She has a long history of chronic back pain with a previous MRI of approximately 3 or 4 years ago which did show some left-sided L4-5 changes. In the last 2-3 months she has noted some left leg pain radiating down to her foot. Sitting makes is much worse. Walking initially helps but then she again has difficulty with pain causing her to limp. Getting in and out of cars also causes difficulty. She continues on fentanyl patches as well as Dilaudid usually one per day that she is taking for chronic pain issues and in spite of this her leg is still giving her a great deal of trouble in getting worse.   Review of Systems     Objective:   Physical Exam Good motion of her back with pain with flexion and extension. Hip motion causes some discomfort. Straight leg raising is positive at 30 with  positive sciatic stretch. Ankle reflexes are diminished, knee reflexes are normal. Pulses are normal sensation is intact. No foot drop noted       Assessment & Plan:  Symptoms suggestive of spinal stenosis. MRI

## 2011-06-20 ENCOUNTER — Other Ambulatory Visit: Payer: Self-pay | Admitting: Family Medicine

## 2011-06-20 ENCOUNTER — Other Ambulatory Visit: Payer: Self-pay

## 2011-06-20 DIAGNOSIS — M549 Dorsalgia, unspecified: Secondary | ICD-10-CM

## 2011-06-24 ENCOUNTER — Ambulatory Visit
Admission: RE | Admit: 2011-06-24 | Discharge: 2011-06-24 | Disposition: A | Discharge status: Home / Self Care | Payer: PRIVATE HEALTH INSURANCE | Source: Ambulatory Visit | Attending: Family Medicine | Admitting: Family Medicine

## 2011-06-24 DIAGNOSIS — M549 Dorsalgia, unspecified: Secondary | ICD-10-CM

## 2011-07-17 ENCOUNTER — Other Ambulatory Visit: Payer: Self-pay | Admitting: Cardiology

## 2011-07-17 ENCOUNTER — Other Ambulatory Visit: Payer: Self-pay | Admitting: *Deleted

## 2011-07-17 ENCOUNTER — Ambulatory Visit
Admission: RE | Admit: 2011-07-17 | Discharge: 2011-07-17 | Disposition: A | Discharge status: Home / Self Care | Payer: PRIVATE HEALTH INSURANCE | Source: Ambulatory Visit | Attending: Cardiology | Admitting: Cardiology

## 2011-07-17 DIAGNOSIS — R079 Chest pain, unspecified: Secondary | ICD-10-CM

## 2011-07-19 DIAGNOSIS — Z9289 Personal history of other medical treatment: Secondary | ICD-10-CM

## 2011-07-19 HISTORY — DX: Personal history of other medical treatment: Z92.89

## 2011-07-26 ENCOUNTER — Other Ambulatory Visit: Payer: Self-pay | Admitting: Cardiothoracic Surgery

## 2011-07-26 ENCOUNTER — Ambulatory Visit (INDEPENDENT_AMBULATORY_CARE_PROVIDER_SITE_OTHER): Payer: Medicare Other | Admitting: Cardiothoracic Surgery

## 2011-07-26 ENCOUNTER — Inpatient Hospital Stay (HOSPITAL_COMMUNITY)
Admission: AD | Admit: 2011-07-26 | Discharge: 2011-08-04 | DRG: 863 | Disposition: A | Discharge status: Home Health | Payer: Medicare Other | Source: Ambulatory Visit | Attending: Cardiothoracic Surgery | Admitting: Cardiothoracic Surgery

## 2011-07-26 ENCOUNTER — Ambulatory Visit (INDEPENDENT_AMBULATORY_CARE_PROVIDER_SITE_OTHER): Payer: PRIVATE HEALTH INSURANCE | Admitting: Medical

## 2011-07-26 ENCOUNTER — Encounter: Payer: Self-pay | Admitting: Medical

## 2011-07-26 VITALS — BP 136/80 | HR 76 | Temp 97.1°F | Resp 16 | Wt 159.0 lb

## 2011-07-26 VITALS — BP 155/86 | HR 85 | Temp 99.8°F | Resp 18 | Ht 65.0 in | Wt 159.0 lb

## 2011-07-26 DIAGNOSIS — A4901 Methicillin susceptible Staphylococcus aureus infection, unspecified site: Secondary | ICD-10-CM | POA: Diagnosis present

## 2011-07-26 DIAGNOSIS — Y832 Surgical operation with anastomosis, bypass or graft as the cause of abnormal reaction of the patient, or of later complication, without mention of misadventure at the time of the procedure: Secondary | ICD-10-CM | POA: Diagnosis present

## 2011-07-26 DIAGNOSIS — M549 Dorsalgia, unspecified: Secondary | ICD-10-CM | POA: Diagnosis present

## 2011-07-26 DIAGNOSIS — Z981 Arthrodesis status: Secondary | ICD-10-CM

## 2011-07-26 DIAGNOSIS — Y92009 Unspecified place in unspecified non-institutional (private) residence as the place of occurrence of the external cause: Secondary | ICD-10-CM

## 2011-07-26 DIAGNOSIS — I4729 Other ventricular tachycardia: Secondary | ICD-10-CM | POA: Diagnosis not present

## 2011-07-26 DIAGNOSIS — Z951 Presence of aortocoronary bypass graft: Secondary | ICD-10-CM

## 2011-07-26 DIAGNOSIS — D649 Anemia, unspecified: Secondary | ICD-10-CM | POA: Diagnosis not present

## 2011-07-26 DIAGNOSIS — I10 Essential (primary) hypertension: Secondary | ICD-10-CM | POA: Diagnosis present

## 2011-07-26 DIAGNOSIS — R339 Retention of urine, unspecified: Secondary | ICD-10-CM | POA: Diagnosis not present

## 2011-07-26 DIAGNOSIS — Z7982 Long term (current) use of aspirin: Secondary | ICD-10-CM

## 2011-07-26 DIAGNOSIS — L0390 Cellulitis, unspecified: Secondary | ICD-10-CM

## 2011-07-26 DIAGNOSIS — R7309 Other abnormal glucose: Secondary | ICD-10-CM | POA: Diagnosis not present

## 2011-07-26 DIAGNOSIS — M810 Age-related osteoporosis without current pathological fracture: Secondary | ICD-10-CM | POA: Diagnosis present

## 2011-07-26 DIAGNOSIS — L0291 Cutaneous abscess, unspecified: Secondary | ICD-10-CM

## 2011-07-26 DIAGNOSIS — N61 Mastitis without abscess: Secondary | ICD-10-CM

## 2011-07-26 DIAGNOSIS — E8779 Other fluid overload: Secondary | ICD-10-CM | POA: Diagnosis not present

## 2011-07-26 DIAGNOSIS — G8929 Other chronic pain: Secondary | ICD-10-CM | POA: Diagnosis present

## 2011-07-26 DIAGNOSIS — I472 Ventricular tachycardia, unspecified: Secondary | ICD-10-CM | POA: Diagnosis not present

## 2011-07-26 DIAGNOSIS — T8140XA Infection following a procedure, unspecified, initial encounter: Principal | ICD-10-CM | POA: Diagnosis present

## 2011-07-26 DIAGNOSIS — J45909 Unspecified asthma, uncomplicated: Secondary | ICD-10-CM | POA: Diagnosis present

## 2011-07-26 DIAGNOSIS — L02219 Cutaneous abscess of trunk, unspecified: Secondary | ICD-10-CM | POA: Diagnosis present

## 2011-07-26 DIAGNOSIS — I2510 Atherosclerotic heart disease of native coronary artery without angina pectoris: Secondary | ICD-10-CM | POA: Diagnosis present

## 2011-07-26 DIAGNOSIS — Z87891 Personal history of nicotine dependence: Secondary | ICD-10-CM

## 2011-07-26 DIAGNOSIS — L03319 Cellulitis of trunk, unspecified: Secondary | ICD-10-CM | POA: Diagnosis present

## 2011-07-26 DIAGNOSIS — Z79899 Other long term (current) drug therapy: Secondary | ICD-10-CM

## 2011-07-26 LAB — CBC
HCT: 34.1 % — ABNORMAL LOW (ref 36.0–46.0)
Hemoglobin: 11.2 g/dL — ABNORMAL LOW (ref 12.0–15.0)
MCH: 29 pg (ref 26.0–34.0)
MCHC: 32.8 g/dL (ref 30.0–36.0)
MCV: 88.3 fL (ref 78.0–100.0)
Platelets: 280 10*3/uL (ref 150–400)
RBC: 3.86 MIL/uL — ABNORMAL LOW (ref 3.87–5.11)
RDW: 13 % (ref 11.5–15.5)
WBC: 9.9 10*3/uL (ref 4.0–10.5)

## 2011-07-26 LAB — URINALYSIS, ROUTINE W REFLEX MICROSCOPIC
Bilirubin Urine: NEGATIVE
Glucose, UA: NEGATIVE mg/dL
Hgb urine dipstick: NEGATIVE
Ketones, ur: NEGATIVE mg/dL
Nitrite: POSITIVE — AB
Protein, ur: NEGATIVE mg/dL
Specific Gravity, Urine: 1.014 (ref 1.005–1.030)
Urobilinogen, UA: 0.2 mg/dL (ref 0.0–1.0)
pH: 5.5 (ref 5.0–8.0)

## 2011-07-26 LAB — BASIC METABOLIC PANEL
BUN: 30 mg/dL — ABNORMAL HIGH (ref 6–23)
CO2: 28 mEq/L (ref 19–32)
Calcium: 9.1 mg/dL (ref 8.4–10.5)
Chloride: 106 mEq/L (ref 96–112)
Creatinine, Ser: 1.09 mg/dL (ref 0.50–1.10)
GFR calc Af Amer: 59 mL/min — ABNORMAL LOW (ref >90)
GFR calc non Af Amer: 51 mL/min — ABNORMAL LOW (ref >90)
Glucose, Bld: 110 mg/dL — ABNORMAL HIGH (ref 70–99)
Potassium: 4.5 mEq/L (ref 3.5–5.1)
Sodium: 145 mEq/L (ref 135–145)

## 2011-07-26 LAB — URINE MICROSCOPIC-ADD ON

## 2011-07-26 LAB — PROTIME-INR
INR: 1.03 (ref 0.00–1.49)
Prothrombin Time: 13.7 seconds (ref 11.6–15.2)

## 2011-07-26 LAB — TYPE AND SCREEN
ABO/RH(D): O POS
Antibody Screen: NEGATIVE

## 2011-07-26 LAB — APTT: aPTT: 39 seconds — ABNORMAL HIGH (ref 24–37)

## 2011-07-26 LAB — SURGICAL PCR SCREEN
MRSA, PCR: NEGATIVE
Staphylococcus aureus: POSITIVE — AB

## 2011-07-26 MED ORDER — METOPROLOL TARTRATE 12.5 MG HALF TABLET
12.5000 mg | ORAL_TABLET | Freq: Three times a day (TID) | ORAL | Status: DC
  Administered 2011-07-26 – 2011-08-01 (×19): 12.5 mg via ORAL
  Filled 2011-07-26 (×22): qty 1

## 2011-07-26 MED ORDER — FUROSEMIDE 40 MG PO TABS
40.0000 mg | ORAL_TABLET | Freq: Every day | ORAL | Status: DC
  Filled 2011-07-26: qty 1

## 2011-07-26 MED ORDER — ADULT MULTIVITAMIN W/MINERALS CH
1.0000 | ORAL_TABLET | Freq: Every day | ORAL | Status: DC
  Administered 2011-07-26 – 2011-08-04 (×10): 1 via ORAL
  Filled 2011-07-26 (×10): qty 1

## 2011-07-26 MED ORDER — POLYETHYLENE GLYCOL 3350 17 G PO PACK: 17.0000 g | PACK | Freq: Once | ORAL | Status: DC

## 2011-07-26 MED ORDER — CALCIUM CARBONATE-VITAMIN D 500-200 MG-UNIT PO TABS
1.0000 | ORAL_TABLET | Freq: Every day | ORAL | Status: DC
  Administered 2011-07-26 – 2011-08-04 (×10): 1 via ORAL
  Filled 2011-07-26 (×10): qty 1

## 2011-07-26 MED ORDER — HYDROCODONE-ACETAMINOPHEN 5-325 MG PO TABS
1.0000 | ORAL_TABLET | ORAL | Status: DC | PRN
  Administered 2011-07-26 – 2011-07-28 (×5): 2 via ORAL
  Administered 2011-07-28 (×2): 1 via ORAL
  Administered 2011-07-29 – 2011-08-04 (×21): 2 via ORAL
  Filled 2011-07-26 (×19): qty 2
  Filled 2011-07-26: qty 1
  Filled 2011-07-26 (×9): qty 2
  Filled 2011-07-26: qty 1

## 2011-07-26 MED ORDER — FENTANYL 50 MCG/HR TD PT72
50.0000 ug | MEDICATED_PATCH | TRANSDERMAL | Status: DC
  Administered 2011-07-29 – 2011-08-01 (×2): 50 ug via TRANSDERMAL
  Filled 2011-07-26 (×2): qty 1

## 2011-07-26 MED ORDER — MONTELUKAST SODIUM 10 MG PO TABS
10.0000 mg | ORAL_TABLET | Freq: Every day | ORAL | Status: DC
  Administered 2011-07-26 – 2011-08-03 (×9): 10 mg via ORAL
  Filled 2011-07-26 (×10): qty 1

## 2011-07-26 MED ORDER — ACETAMINOPHEN 325 MG PO TABS
650.0000 mg | ORAL_TABLET | Freq: Four times a day (QID) | ORAL | Status: DC | PRN
  Administered 2011-08-01: 650 mg via ORAL
  Filled 2011-07-26: qty 2

## 2011-07-26 MED ORDER — GABAPENTIN 600 MG PO TABS
600.0000 mg | ORAL_TABLET | Freq: Four times a day (QID) | ORAL | Status: DC
  Administered 2011-07-26 – 2011-08-04 (×34): 600 mg via ORAL
  Filled 2011-07-26 (×37): qty 1

## 2011-07-26 MED ORDER — CARISOPRODOL 350 MG PO TABS
350.0000 mg | ORAL_TABLET | Freq: Four times a day (QID) | ORAL | Status: DC | PRN
  Administered 2011-07-27 – 2011-07-28 (×2): 350 mg via ORAL
  Filled 2011-07-26 (×2): qty 1

## 2011-07-26 MED ORDER — ATORVASTATIN CALCIUM 10 MG PO TABS
10.0000 mg | ORAL_TABLET | Freq: Every day | ORAL | Status: DC
  Administered 2011-07-27 – 2011-08-03 (×8): 10 mg via ORAL
  Filled 2011-07-26 (×10): qty 1

## 2011-07-26 MED ORDER — ALBUTEROL SULFATE HFA 108 (90 BASE) MCG/ACT IN AERS
1.0000 | INHALATION_SPRAY | Freq: Four times a day (QID) | RESPIRATORY_TRACT | Status: DC | PRN
  Filled 2011-07-26: qty 6.7

## 2011-07-26 MED ORDER — DEXTROSE 5 % IV SOLN
1.0000 g | Freq: Three times a day (TID) | INTRAVENOUS | Status: DC
  Administered 2011-07-26 – 2011-07-28 (×5): 1 g via INTRAVENOUS
  Filled 2011-07-26 (×7): qty 1

## 2011-07-26 MED ORDER — CALCIUM CARBONATE-VITAMIN D 250-125 MG-UNIT PO TABS: 1.0000 | ORAL_TABLET | Freq: Every day | ORAL | Status: DC

## 2011-07-26 MED ORDER — FOLIC ACID 1 MG PO TABS
1.0000 mg | ORAL_TABLET | Freq: Every day | ORAL | Status: DC
  Filled 2011-07-26: qty 1

## 2011-07-26 MED ORDER — VANCOMYCIN HCL IN DEXTROSE 1-5 GM/200ML-% IV SOLN
1000.0000 mg | Freq: Two times a day (BID) | INTRAVENOUS | Status: DC
  Administered 2011-07-26 – 2011-07-31 (×10): 1000 mg via INTRAVENOUS
  Filled 2011-07-26 (×11): qty 200

## 2011-07-26 MED ORDER — ASPIRIN EC 81 MG PO TBEC: 81.0000 mg | DELAYED_RELEASE_TABLET | Freq: Every day | ORAL | Status: DC

## 2011-07-26 MED ORDER — VITAMIN D3 25 MCG (1000 UNIT) PO TABS
2000.0000 [IU] | ORAL_TABLET | Freq: Every day | ORAL | Status: DC
  Administered 2011-07-26 – 2011-08-04 (×10): 2000 [IU] via ORAL
  Filled 2011-07-26 (×10): qty 2

## 2011-07-26 MED ORDER — HYDROXYCHLOROQUINE SULFATE 200 MG PO TABS
200.0000 mg | ORAL_TABLET | Freq: Every day | ORAL | Status: DC
  Administered 2011-07-26: 200 mg via ORAL
  Filled 2011-07-26 (×2): qty 1

## 2011-07-26 MED ORDER — FLUTICASONE-SALMETEROL 230-21 MCG/ACT IN AERO
2.0000 | INHALATION_SPRAY | Freq: Two times a day (BID) | RESPIRATORY_TRACT | Status: DC
  Administered 2011-07-27: 2 via RESPIRATORY_TRACT

## 2011-07-26 MED ORDER — VITAMIN D 50 MCG (2000 UT) PO CAPS: 2000.0000 [IU] | ORAL_CAPSULE | Freq: Every day | ORAL | Status: DC

## 2011-07-26 MED ORDER — TRAMADOL HCL 50 MG PO TABS
50.0000 mg | ORAL_TABLET | Freq: Four times a day (QID) | ORAL | Status: DC | PRN
  Administered 2011-07-27: 50 mg via ORAL
  Filled 2011-07-26: qty 1

## 2011-07-26 MED ORDER — ASPIRIN 325 MG PO TABS
325.0000 mg | ORAL_TABLET | Freq: Every day | ORAL | Status: DC
  Administered 2011-07-27 – 2011-08-04 (×9): 325 mg via ORAL
  Filled 2011-07-26 (×9): qty 1

## 2011-07-26 MED ORDER — CARISOPRODOL 350 MG PO TABS: 350.0000 mg | ORAL_TABLET | Freq: Four times a day (QID) | ORAL | Status: DC

## 2011-07-26 NOTE — Progress Notes (Signed)
Procedure(s) (LRB): STERNAL WOUND DEBRIDEMENT (N/A) Subjective:                      [address]            [name] [address]          [phone]     71 year old Caucasian female nondiabetic status post coronary bypass grafting 01/12/2011 for three-vessel disease. Did well following surgery and was released from care in February. Over the past 5 days she developed redness tenderness and swelling over her lower chest incision in left breast. She presented to her primary care physician and was transferred for office. It appeared she had a superficial sternal infection with cellulitis. The area was prepped and anesthetized and I indeed and purulent material sent for culture. The patient will be admitted the hospital for IV antibiotics and sternal debridement under anesthesia tomorrow.  Objective: Vital signs in last 24 hours: Temp:  [97.1 F (36.2 C)-99.8 F (37.7 C)] 99.8 F (37.7 C) (07/24 1646) Pulse Rate:  [76-85] 85  (07/24 1646) Cardiac Rhythm:  [-]  Resp:  [16-18] 18  (07/24 1646) BP: (136-155)/(80-86) 155/86 mmHg (07/24 1646) SpO2:  [100 %] 100 % (07/24 1646) Weight:  [159 lb (72.122 kg)] 159 lb (72.122 kg) (07/24 1646)  Hemodynamic parameters for last 24 hours:    Intake/Output from previous day:   Intake/Output this shift:    Exam Patient afebrile sinus rhythm blood pressure 130/80 temperature 97.1 Gen. patient anxious complaining of pain over her chest wall Neck without fullness tenderness or JVD Chest raised swollen area over the sternal incision consistent with chest wall abscess with cellulitis extending into the left lower breast extremely tender, breath sounds clear Cardiac regular rhythm without murmur Abdomen soft nontender Extremities warm nontender no edema Neuro intact  Lab Results: No results found for this basename: WBC:2,HGB:2,HCT:2,PLT:2 in the last 72 hours BMET: No results found for this basename:  NA:2,K:2,CL:2,CO2:2,GLUCOSE:2,BUN:2,CREATININE:2,CALCIUM:2 in the last 72 hours  PT/INR: No results found for this basename: LABPROT,INR in the last 72 hours ABG    Component Value Date/Time   PHART 7.307* 01/14/2011 2010   HCO3 22.7 01/14/2011 2010   TCO2 23 01/15/2011 1718   ACIDBASEDEF 3.0* 01/14/2011 2010   O2SAT 95.0 01/14/2011 2010   CBG (last 3)  No results found for this basename: GLUCAP:3 in the last 72 hours  Assessment/Plan: S/P Procedure(s) (LRB): STERNAL WOUND DEBRIDEMENT (N/A) Plan for IV antibiotics and sternal wound debridement and oh or under anesthesia tomorrow.      VAN TRIGT III,PETER 07/26/2011

## 2011-07-26 NOTE — Progress Notes (Signed)
Subjective: Here for 4-5 day hx/o red knot on chest.  She notes having open heart surgery in January.  About 4-5 days ago a knot appearing along the surgical scar, then progressively worsened to redness, warmth, and squishy area in the middle.  Left breast is red and hot too.  The whole area is painful.   Couldn't sleep last night due to the pain.  No prior similar.  No other c/o.  No fever, chills, nausea.   Objective: Gen: wd, wn, nad Skin: left medial and inferior medial breast with diffuse erythema, streaking, midline chest surgical scar from prior CABG, inferior portion of the surgical scar with 5cm oblong fluctuance tender mass, while area with warmth and erythema, quite tender   ROS as noted in HPI  Assessment: Encounter Diagnoses  Name Primary?  . Cellulitis of breast Yes  . Skin abscess    Plan: Discussed case with supervising physician, Dr. Susann Givens.   Called cardiothoracic surgeon, Dr. Donata Clay to get his input first.  Given the location of the abscess, he will see her now.  She report directly to Dr. Zenaida Niece Trigt's office now.

## 2011-07-27 ENCOUNTER — Encounter (HOSPITAL_COMMUNITY): Payer: Self-pay | Admitting: *Deleted

## 2011-07-27 ENCOUNTER — Inpatient Hospital Stay (HOSPITAL_COMMUNITY): Payer: Medicare Other | Admitting: Anesthesiology

## 2011-07-27 ENCOUNTER — Encounter (HOSPITAL_COMMUNITY): Payer: Self-pay | Admitting: Anesthesiology

## 2011-07-27 ENCOUNTER — Inpatient Hospital Stay (HOSPITAL_COMMUNITY): Payer: Medicare Other

## 2011-07-27 ENCOUNTER — Encounter (HOSPITAL_COMMUNITY)
Admission: AD | Disposition: A | Discharge status: Home Health | Payer: Self-pay | Source: Ambulatory Visit | Attending: Cardiothoracic Surgery

## 2011-07-27 ENCOUNTER — Inpatient Hospital Stay: Admit: 2011-07-27 | Payer: Self-pay | Admitting: Cardiothoracic Surgery

## 2011-07-27 DIAGNOSIS — L03319 Cellulitis of trunk, unspecified: Secondary | ICD-10-CM

## 2011-07-27 DIAGNOSIS — L02219 Cutaneous abscess of trunk, unspecified: Secondary | ICD-10-CM

## 2011-07-27 HISTORY — PX: STERNAL WOUND DEBRIDEMENT: SHX1058

## 2011-07-27 LAB — GRAM STAIN

## 2011-07-27 SURGERY — DEBRIDEMENT, WOUND, STERNUM
Anesthesia: General | Site: Chest | Wound class: Contaminated

## 2011-07-27 MED ORDER — SCOPOLAMINE 1 MG/3DAYS TD PT72
MEDICATED_PATCH | TRANSDERMAL | Status: DC | PRN
  Administered 2011-07-27: 1 via TRANSDERMAL

## 2011-07-27 MED ORDER — MIDAZOLAM HCL 5 MG/5ML IJ SOLN
INTRAMUSCULAR | Status: DC | PRN
  Administered 2011-07-27: 2 mg via INTRAVENOUS

## 2011-07-27 MED ORDER — ONDANSETRON HCL 4 MG/2ML IJ SOLN
INTRAMUSCULAR | Status: DC | PRN
  Administered 2011-07-27: 4 mg via INTRAVENOUS

## 2011-07-27 MED ORDER — PROPOFOL 10 MG/ML IV EMUL
INTRAVENOUS | Status: DC | PRN
  Administered 2011-07-27: 150 mg via INTRAVENOUS

## 2011-07-27 MED ORDER — SODIUM CHLORIDE 0.9 % IR SOLN
Status: DC | PRN
  Administered 2011-07-27: 1

## 2011-07-27 MED ORDER — NEOSTIGMINE METHYLSULFATE 1 MG/ML IJ SOLN
INTRAMUSCULAR | Status: DC | PRN
  Administered 2011-07-27: 5 mg via INTRAVENOUS

## 2011-07-27 MED ORDER — EPHEDRINE SULFATE 50 MG/ML IJ SOLN
INTRAMUSCULAR | Status: DC | PRN
  Administered 2011-07-27 (×2): 10 mg via INTRAVENOUS

## 2011-07-27 MED ORDER — FENTANYL CITRATE 0.05 MG/ML IJ SOLN
INTRAMUSCULAR | Status: DC | PRN
  Administered 2011-07-27: 75 ug via INTRAVENOUS
  Administered 2011-07-27 (×2): 50 ug via INTRAVENOUS
  Administered 2011-07-27: 75 ug via INTRAVENOUS

## 2011-07-27 MED ORDER — ROCURONIUM BROMIDE 100 MG/10ML IV SOLN
INTRAVENOUS | Status: DC | PRN
  Administered 2011-07-27: 10 mg via INTRAVENOUS
  Administered 2011-07-27: 40 mg via INTRAVENOUS

## 2011-07-27 MED ORDER — LACTATED RINGERS IV SOLN
INTRAVENOUS | Status: DC | PRN
  Administered 2011-07-27: 14:00:00 via INTRAVENOUS

## 2011-07-27 MED ORDER — SODIUM CHLORIDE 0.9 % IR SOLN
Status: DC | PRN
  Administered 2011-07-27: 3000 mL

## 2011-07-27 MED ORDER — FLUTICASONE-SALMETEROL 230-21 MCG/ACT IN AERO
2.0000 | INHALATION_SPRAY | Freq: Two times a day (BID) | RESPIRATORY_TRACT | Status: DC
  Administered 2011-07-29 – 2011-08-03 (×5): 2 via RESPIRATORY_TRACT
  Filled 2011-07-27 (×2): qty 0.2

## 2011-07-27 MED ORDER — LIDOCAINE HCL 4 % MT SOLN
OROMUCOSAL | Status: DC | PRN
  Administered 2011-07-27: 4 mL via TOPICAL

## 2011-07-27 MED ORDER — SODIUM CHLORIDE 0.9 % IV SOLN
INTRAVENOUS | Status: DC
  Administered 2011-07-27 – 2011-08-03 (×4): via INTRAVENOUS

## 2011-07-27 MED ORDER — LACTATED RINGERS IV SOLN
INTRAVENOUS | Status: DC
  Administered 2011-07-27: 14:00:00 via INTRAVENOUS

## 2011-07-27 MED ORDER — HYDROMORPHONE HCL PF 1 MG/ML IJ SOLN
0.2500 mg | INTRAMUSCULAR | Status: DC | PRN
  Administered 2011-07-27 – 2011-07-29 (×5): 0.5 mg via INTRAVENOUS
  Filled 2011-07-27 (×2): qty 1

## 2011-07-27 MED ORDER — GLYCOPYRROLATE 0.2 MG/ML IJ SOLN
INTRAMUSCULAR | Status: DC | PRN
  Administered 2011-07-27: .8 mg via INTRAVENOUS

## 2011-07-27 MED ORDER — LIDOCAINE HCL (CARDIAC) 20 MG/ML IV SOLN
INTRAVENOUS | Status: DC | PRN
  Administered 2011-07-27: 100 mg via INTRAVENOUS

## 2011-07-27 SURGICAL SUPPLY — 62 items
ATTRACTOMAT 16X20 MAGNETIC DRP (DRAPES) ×3 IMPLANT
BAG DECANTER FOR FLEXI CONT (MISCELLANEOUS) ×3 IMPLANT
BAG URIMETER 350ML BARDEX IC (UROLOGICAL SUPPLIES)
BAG URIMETER BARDEX IC 350 (UROLOGICAL SUPPLIES) IMPLANT
BANDAGE GAUZE ELAST BULKY 4 IN (GAUZE/BANDAGES/DRESSINGS) IMPLANT
BENZOIN TINCTURE PRP APPL 2/3 (GAUZE/BANDAGES/DRESSINGS) IMPLANT
BLADE SURG 10 STRL SS (BLADE) ×6 IMPLANT
CANISTER SUCTION 2500CC (MISCELLANEOUS) ×3 IMPLANT
CATH FOLEY 2WAY SLVR  5CC 16FR (CATHETERS)
CATH FOLEY 2WAY SLVR 5CC 16FR (CATHETERS) IMPLANT
CATH THORACIC 28FR RT ANG (CATHETERS) IMPLANT
CATH THORACIC 36FR (CATHETERS) IMPLANT
CATH THORACIC 36FR RT ANG (CATHETERS) IMPLANT
CLIP TI WIDE RED SMALL 24 (CLIP) IMPLANT
CLOTH BEACON ORANGE TIMEOUT ST (SAFETY) ×3 IMPLANT
CONN Y 3/8X3/8X3/8  BEN (MISCELLANEOUS)
CONN Y 3/8X3/8X3/8 BEN (MISCELLANEOUS) IMPLANT
CONT SPEC 4OZ CLIKSEAL STRL BL (MISCELLANEOUS) IMPLANT
COVER SURGICAL LIGHT HANDLE (MISCELLANEOUS) ×6 IMPLANT
DRAPE LAPAROSCOPIC ABDOMINAL (DRAPES) ×3 IMPLANT
DRAPE WARM FLUID 44X44 (DRAPE) IMPLANT
DRSG PAD ABDOMINAL 8X10 ST (GAUZE/BANDAGES/DRESSINGS) IMPLANT
DRSG VAC ATS MED SENSATRAC (GAUZE/BANDAGES/DRESSINGS) ×3 IMPLANT
ELECT REM PT RETURN 9FT ADLT (ELECTROSURGICAL) ×3
ELECTRODE REM PT RTRN 9FT ADLT (ELECTROSURGICAL) ×1 IMPLANT
GAUZE XEROFORM 5X9 LF (GAUZE/BANDAGES/DRESSINGS) IMPLANT
GLOVE BIO SURGEON STRL SZ 6.5 (GLOVE) ×6 IMPLANT
GLOVE BIO SURGEON STRL SZ7.5 (GLOVE) ×6 IMPLANT
GLOVE BIO SURGEONS STRL SZ 6.5 (GLOVE) ×3
GLOVE BIOGEL PI IND STRL 7.0 (GLOVE) ×3 IMPLANT
GLOVE BIOGEL PI INDICATOR 7.0 (GLOVE) ×6
GOWN STRL NON-REIN LRG LVL3 (GOWN DISPOSABLE) ×15 IMPLANT
HANDPIECE INTERPULSE COAX TIP (DISPOSABLE) ×4
HEMOSTAT POWDER SURGIFOAM 1G (HEMOSTASIS) IMPLANT
HEMOSTAT SURGICEL 2X14 (HEMOSTASIS) IMPLANT
IV NS IRRIG 3000ML ARTHROMATIC (IV SOLUTION) ×3 IMPLANT
KIT BASIN OR (CUSTOM PROCEDURE TRAY) ×3 IMPLANT
KIT ROOM TURNOVER OR (KITS) ×3 IMPLANT
KIT SUCTION CATH 14FR (SUCTIONS) IMPLANT
NS IRRIG 1000ML POUR BTL (IV SOLUTION) ×3 IMPLANT
PACK CHEST (CUSTOM PROCEDURE TRAY) ×3 IMPLANT
PAD ARMBOARD 7.5X6 YLW CONV (MISCELLANEOUS) ×6 IMPLANT
SET HNDPC FAN SPRY TIP SCT (DISPOSABLE) ×2 IMPLANT
SOLUTION BETADINE 4OZ (MISCELLANEOUS) IMPLANT
SPONGE GAUZE 4X4 12PLY (GAUZE/BANDAGES/DRESSINGS) ×3 IMPLANT
SPONGE LAP 18X18 X RAY DECT (DISPOSABLE) ×3 IMPLANT
STAPLER VISISTAT 35W (STAPLE) IMPLANT
STRAP MONTGOMERY 1.25X11-1/8 (MISCELLANEOUS) IMPLANT
SUT ETHILON 3 0 FSL (SUTURE) IMPLANT
SUT STEEL 6MS V (SUTURE) IMPLANT
SUT STEEL STERNAL CCS#1 18IN (SUTURE) IMPLANT
SUT STEEL SZ 6 DBL 3X14 BALL (SUTURE) IMPLANT
SUT VIC AB 1 CTX 36 (SUTURE) ×4
SUT VIC AB 1 CTX36XBRD ANBCTR (SUTURE) ×2 IMPLANT
SUT VIC AB 2-0 CTX 27 (SUTURE) ×6 IMPLANT
SUT VIC AB 3-0 X1 27 (SUTURE) ×6 IMPLANT
SWAB COLLECTION DEVICE MRSA (MISCELLANEOUS) ×3 IMPLANT
SYR 5ML LL (SYRINGE) IMPLANT
TOWEL OR 17X24 6PK STRL BLUE (TOWEL DISPOSABLE) ×3 IMPLANT
TOWEL OR 17X26 10 PK STRL BLUE (TOWEL DISPOSABLE) ×3 IMPLANT
TUBE ANAEROBIC SPECIMEN COL (MISCELLANEOUS) IMPLANT
WATER STERILE IRR 1000ML POUR (IV SOLUTION) ×3 IMPLANT

## 2011-07-27 NOTE — Care Management Note (Unsigned)
    Page 1 of 2   07/31/2011     11:16:11 AM   CARE MANAGEMENT NOTE 07/31/2011  Patient:  [name] Long, [name] Long   Account Number:  [id number]  Date Initiated:  07/27/2011  Documentation initiated by:  SIMMONS,Averleigh Savary  Subjective/Objective Assessment:   ADMITTED WITH STERNAL WOUND INFECTION; LIVES AT HOME WITH HUSBAND- SCOTT; WAS IPTA; USES [facility] ON [address] FOR RX.     Action/Plan:   DISCHARGE PLANNING DISCUSSED AT BEDSIDE.   Anticipated DC Date:  08/01/2011   Anticipated DC Plan:  HOME W HOME HEALTH SERVICES      DC Planning Services  CM consult      PAC Choice  DURABLE MEDICAL EQUIPMENT  HOME HEALTH   Choice offered to / List presented to:  C-1 Patient   DME arranged  VAC      DME agency  [employer]     HH arranged  HH-1 RN      [facility] agency  [facility]   Status of service:  In process, will continue to follow Medicare Important Message given?   (If response is "NO", the following Medicare IM given date fields will be blank) Date Medicare IM given:   Date Additional Medicare IM given:    Discharge Disposition:    Per UR Regulation:  Reviewed for med. necessity/level of care/duration of stay  If discussed at Long Length of Stay Meetings, dates discussed:    Comments:  07/31/11  1113  Ossiel Marchio SIMMONS RN, BSN [phone] RECEIVED APPROVAL FROM [employer] FOR WOUND VAC FROM INSURANCE COMPANY; FOR POSSIBLE D/C TOMORROW 7/30//13; VAC TO BE DELIVERED TO ROOM BEFORE D/C; NCM FOLLOWING.  07/28/11  1517  Garreth Burnsworth SIMMONS RN, BSN [phone] REFERRAL PLACED TO [facility] WITH [facility] FOR HHRN PER CHOICE; WOUND VAC FORMS FAXED, CONFIRMATION RECEIVED- PLACED IN FRONT OF SHADOW CHART; WILL AWAIT INSURANCE APPROVAL FOR VAC.  NCM WILL FOLLOW.   07/27/11  1108  Lorayne Getchell SIMMONS RN, BSN [phone] TO OR TODAY- LIKELY TO D/C WITH WOUND VAC; NCM WILL FOLLOW.

## 2011-07-27 NOTE — Brief Op Note (Signed)
07/26/2011 - 07/27/2011  3:21 PM  PATIENT:  [name] Long  71 y.o. female  PRE-OPERATIVE DIAGNOSIS:  sternal wound abscess  POST-OPERATIVE DIAGNOSIS:  sternal wound abscess  PROCEDURE:  Procedure(s) (LRB): STERNAL WOUND DEBRIDEMENT and placement of VAC  SURGEON:  Surgeon(s) and Role:    * [name], MD - Primary  PHYSICIAN ASSISTANT: Gershon Crane PA-C   ANESTHESIA:   general  EBL:  Total I/O In: -  Out: 20 [Blood:20]  BLOOD ADMINISTERED:none  DRAINS: none   LOCAL MEDICATIONS USED:  NONE  SPECIMEN:  Source of Specimen:  wound cultures  DISPOSITION OF SPECIMEN:  micro  COUNTS:  YES  TOURNIQUET:  * No tourniquets in log *  DICTATION: .Dragon Dictation  PLAN OF CARE: Admit to inpatient   PATIENT DISPOSITION:  PACU - hemodynamically stable.   Delay start of Pharmacological VTE agent (>24hrs) due to surgical blood loss or risk of bleeding: no

## 2011-07-27 NOTE — Anesthesia Preprocedure Evaluation (Addendum)
Anesthesia Evaluation  Patient identified by MRN, date of birth, ID band  Reviewed: Allergy & Precautions, H&P , NPO status , Patient's Chart, lab work & pertinent test results, reviewed documented beta blocker date and time   History of Anesthesia Complications Negative for: history of anesthetic complications  Airway Mallampati: II      Dental   Pulmonary asthma , former smoker,  breath sounds clear to auscultation        Cardiovascular hypertension, - angina+ CAD Rhythm:Regular Rate:Normal     Neuro/Psych negative psych ROS   GI/Hepatic negative GI ROS, Neg liver ROS,   Endo/Other  negative endocrine ROS  Renal/GU negative Renal ROS     Musculoskeletal  (+) Fibromyalgia -  Abdominal   Peds  Hematology negative hematology ROS (+)   Anesthesia Other Findings   Reproductive/Obstetrics                          Anesthesia Physical Anesthesia Plan  ASA: III  Anesthesia Plan: General   Post-op Pain Management:    Induction: Intravenous  Airway Management Planned: Oral ETT  Additional Equipment:   Intra-op Plan:   Post-operative Plan: Extubation in OR  Informed Consent: I have reviewed the patients History and Physical, chart, labs and discussed the procedure including the risks, benefits and alternatives for the proposed anesthesia with the patient or authorized representative who has indicated his/her understanding and acceptance.   Dental advisory given  Plan Discussed with:   Anesthesia Plan Comments:         Anesthesia Quick Evaluation

## 2011-07-27 NOTE — H&P (Signed)
patient examined and medical record reviewed,agree with above note.  Wound gram stain -  gram pos cocci in pairs- cont vanc and fortaz for now until sensitivities completed. Plan debridement and wound vac. Picc for extended home IV antibiotics VAN TRIGT III,Callie Bunyard 07/27/2011

## 2011-07-27 NOTE — Transfer of Care (Signed)
Immediate Anesthesia Transfer of Care Note  [name]  Procedure(s) Performed: Procedure(s) (LRB): STERNAL WOUND DEBRIDEMENT (N/A)  Patient Location: PACU  Anesthesia Type: General  Level of Consciousness: awake, alert  and oriented  Airway & Oxygen Therapy: Patient Spontanous Breathing and Patient connected to nasal cannula oxygen  Post-op Assessment: Report given to PACU RN and Post -op Vital signs reviewed and stable  Post vital signs: Reviewed and stable  Complications: No apparent anesthesia complications

## 2011-07-27 NOTE — Preoperative (Signed)
Beta Blockers   Reason not to administer Beta Blockers:Not Applicable 

## 2011-07-27 NOTE — Anesthesia Postprocedure Evaluation (Signed)
  Anesthesia Post-op Note  [name]  Procedure(s) Performed: Procedure(s) (LRB): STERNAL WOUND DEBRIDEMENT (N/A)  Patient Location: PACU  Anesthesia Type: General  Level of Consciousness: awake  Airway and Oxygen Therapy: Patient Spontanous Breathing  Post-op Pain: mild  Post-op Assessment: Post-op Vital signs reviewed  Post-op Vital Signs: Reviewed  Complications: No apparent anesthesia complications

## 2011-07-27 NOTE — H&P (Signed)
[address]            [place] [address]          [phone]     [name] Long is an 71 y.o. female.  @bday  BJY:[id number] Chief Complaint: sternal wound infection HPI: 71 year old female s/p Cabg in January who presented yesterday to primary care physician with evidence of sternal wound infection. Sent to [facility] office where Dr Maren Beach confirmed findings and she was admitted gor IV abx and plans made for sternal wound I+ D/debridement. In office the wound was opened and cultured. Obvious evidence of cellulites was also noted. Symptoms began about four days ago.   Past Medical History  Diagnosis Date  . Allergy     RHINITIS  . Asthma   . Fibromyalgia   . Fibromyalgia   . Chronic low back pain   . Osteoporosis     OSTEOPENIA  . PPD positive   . Connective tissue disease   . Urinary incontinence   . Hypertension   . Crescendo angina, with EKG changes, new. 01/12/2011  . Abnormal EKG 01/12/2011  . Back pain, chronic--plans were for cervical disc surgery week of 01/16/11 01/12/2011  . Asthma 01/12/2011    Past Surgical History  Procedure Date  . Appendectomy   . Abdominal hysterectomy   . Bladder repair   . Cervical fusion   . Breast surgery     REDUCTION  . Carpal tunnel release   . Coronary artery bypass graft 01/14/2011    Procedure: CORONARY ARTERY BYPASS GRAFTING (CABG);  Surgeon: Kathlee Nations Suann Larry, MD;  [facility] OR;  Service: Open Heart Surgery;  Laterality: N/A;  CABG x three, using right greater saphenous vein harvested endoscopically   Hip Bursae surgery  Family History  Problem Relation Age of Onset  . Adopted: Yes   Social History:  reports that she quit smoking about 31 years ago. She has never used smokeless tobacco. She reports that she does not drink alcohol or use illicit drugs.  Allergies: No Known Allergies  Medications Prior to Admission  Medication Sig Dispense Refill  . albuterol (PROVENTIL HFA;VENTOLIN HFA) 108  (90 BASE) MCG/ACT inhaler Inhale 1 puff into the lungs every 6 (six) hours as needed. For shortness of breath      . aspirin EC 81 MG EC tablet Take 1 tablet (81 mg total) by mouth daily.      . Calcium Carbonate-Vitamin D (CALCIUM + D PO) Take 1 tablet by mouth daily.        . carisoprodol (SOMA) 350 MG tablet Take 350 mg by mouth 4 (four) times daily as needed.       . fentaNYL (DURAGESIC - DOSED MCG/HR) 50 MCG/HR Place 1 patch onto the skin every 3 (three) days.        . Fluticasone-Salmeterol (ADVAIR) 250-50 MCG/DOSE AEPB Inhale 1 puff into the lungs every morning.      . gabapentin (NEURONTIN) 600 MG tablet Take 600 mg by mouth 4 (four) times daily.       . hydroxychloroquine (PLAQUENIL) 200 MG tablet Take 400 mg by mouth every evening.      . metoprolol tartrate (LOPRESSOR) 25 MG tablet Take 25 mg by mouth 2 (two) times daily.      . montelukast (SINGULAIR) 10 MG tablet TAKE 1 TABLET ONCE A DAY  90 tablet  3  .  Multiple Vitamin (MULITIVITAMIN WITH MINERALS) TABS Take 1 tablet by mouth daily.      . rosuvastatin (CRESTOR) 10 MG tablet Take 1 tablet (10 mg total) by mouth daily.  30 tablet  1  Dilaudid 10 MG daily prn  Results for orders placed during the hospital encounter of 07/26/11 (from the past 48 hour(s))  SURGICAL PCR SCREEN     Status: Abnormal   Collection Time   07/26/11  6:09 PM      Component Value Range Comment   MRSA, PCR NEGATIVE  NEGATIVE    Staphylococcus aureus POSITIVE (*) NEGATIVE   TYPE AND SCREEN     Status: Normal   Collection Time   07/26/11  7:09 PM      Component Value Range Comment   ABO/RH(D) O POS      Antibody Screen NEG      Sample Expiration 07/29/2011     APTT     Status: Abnormal   Collection Time   07/26/11  7:33 PM      Component Value Range Comment   aPTT 39 (*) 24 - 37 seconds   BASIC METABOLIC PANEL     Status: Abnormal   Collection Time   07/26/11  7:33 PM      Component Value Range Comment   Sodium 145  135 - 145 mEq/L    Potassium 4.5   3.5 - 5.1 mEq/L    Chloride 106  96 - 112 mEq/L    CO2 28  19 - 32 mEq/L    Glucose, Bld 110 (*) 70 - 99 mg/dL    BUN 30 (*) 6 - 23 mg/dL    Creatinine, Ser 1.61  0.50 - 1.10 mg/dL    Calcium 9.1  8.4 - 09.6 mg/dL    GFR calc non Af Amer 51 (*) >90 mL/min    GFR calc Af Amer 59 (*) >90 mL/min   CBC     Status: Abnormal   Collection Time   07/26/11  7:33 PM      Component Value Range Comment   WBC 9.9  4.0 - 10.5 K/uL    RBC 3.86 (*) 3.87 - 5.11 MIL/uL    Hemoglobin 11.2 (*) 12.0 - 15.0 g/dL    HCT 04.5 (*) 40.9 - 46.0 %    MCV 88.3  78.0 - 100.0 fL    MCH 29.0  26.0 - 34.0 pg    MCHC 32.8  30.0 - 36.0 g/dL    RDW 81.1  91.4 - 78.2 %    Platelets 280  150 - 400 K/uL   PROTIME-INR     Status: Normal   Collection Time   07/26/11  7:33 PM      Component Value Range Comment   Prothrombin Time 13.7  11.6 - 15.2 seconds    INR 1.03  0.00 - 1.49   URINALYSIS, ROUTINE W REFLEX MICROSCOPIC     Status: Abnormal   Collection Time   07/26/11  8:09 PM      Component Value Range Comment   Color, Urine YELLOW  YELLOW    APPearance HAZY (*) CLEAR    Specific Gravity, Urine 1.014  1.005 - 1.030    pH 5.5  5.0 - 8.0    Glucose, UA NEGATIVE  NEGATIVE mg/dL    Hgb urine dipstick NEGATIVE  NEGATIVE    Bilirubin Urine NEGATIVE  NEGATIVE    Ketones, ur NEGATIVE  NEGATIVE mg/dL    Protein, ur NEGATIVE  NEGATIVE mg/dL    Urobilinogen, UA 0.2  0.0 - 1.0 mg/dL    Nitrite POSITIVE (*) NEGATIVE    Leukocytes, UA TRACE (*) NEGATIVE   URINE MICROSCOPIC-ADD ON     Status: Abnormal   Collection Time   07/26/11  8:09 PM      Component Value Range Comment   WBC, UA 3-6  <3 WBC/hpf    Bacteria, UA MANY (*) RARE    Casts HYALINE CASTS (*) NEGATIVE    Urine-Other MUCOUS PRESENT      Dg Chest 2 View  07/27/2011  *RADIOLOGY REPORT*  Clinical Data: Preop sternal incision debridement  CHEST - 2 VIEW  Comparison: 07/17/2011  Findings: Very mild patchy right upper lobe opacity, possibly infectious. No pleural  effusion or pneumothorax.  The heart is normal in size. Postsurgical changes related to prior CABG.  Degenerative changes of the visualized thoracolumbar spine.  IMPRESSION: Very mild patchy right upper lobe opacity, possibly infectious.  Original Report Authenticated By: Charline Bills, M.D.   Review of Systems - Negative except as above and chronic pain from arthritis   Blood pressure 157/84, pulse 69, temperature 98.3 F (36.8 C), temperature source Oral, resp. rate 18, height 5\' 5"  (1.651 m), weight 158 lb 1.1 oz (71.7 kg), SpO2 92.00%. Physical Examination: General appearance - alert, well appearing, and in no distress Chest - clear to auscultation, no wheezes, rales or rhonchi, symmetric air entry Heart - normal rate, regular rhythm, normal S1, S2, no murmurs, rubs, clicks or gallops Abdomen - soft, nontender, nondistended, no masses or organomegaly Neurological - alert, oriented, normal speech, no focal findings or movement disorder noted Musculoskeletal - no joint tenderness, deformity or swelling Extremities - peripheral pulses normal, no pedal edema, no clubbing or cyanosis Skin - sternal incision open with packing /dressing in place. Some erethema noted Assessment/Plan Sternal wound infection for debridement. VAC placement.  GOLD,WAYNE E 07/27/2011, 1:19 PM

## 2011-07-28 ENCOUNTER — Encounter (HOSPITAL_COMMUNITY): Payer: Self-pay | Admitting: Cardiothoracic Surgery

## 2011-07-28 LAB — VANCOMYCIN, TROUGH: Vancomycin Tr: 18.9 ug/mL (ref 10.0–20.0)

## 2011-07-28 MED ORDER — DEXTROSE 5 % IV SOLN
1.0000 g | Freq: Two times a day (BID) | INTRAVENOUS | Status: DC
  Administered 2011-07-28 – 2011-07-29 (×2): 1 g via INTRAVENOUS
  Filled 2011-07-28 (×4): qty 1

## 2011-07-28 MED ORDER — DEXTROSE 5 % IV SOLN: 1.0000 g | Freq: Two times a day (BID) | INTRAVENOUS | Status: DC

## 2011-07-28 MED ORDER — TRAMADOL HCL 50 MG PO TABS: 50.0000 mg | ORAL_TABLET | Freq: Four times a day (QID) | ORAL | Status: AC | PRN

## 2011-07-28 MED ORDER — VANCOMYCIN HCL IN DEXTROSE 1-5 GM/200ML-% IV SOLN: 1000.0000 mg | Freq: Two times a day (BID) | INTRAVENOUS | Status: DC

## 2011-07-28 MED ORDER — ASPIRIN 325 MG PO TABS: 325.0000 mg | ORAL_TABLET | Freq: Every day | ORAL | Status: DC

## 2011-07-28 NOTE — Progress Notes (Signed)
[address]            [name] [address]          [phone]     1 Day Post-Op  Procedure(s) (LRB): STERNAL WOUND DEBRIDEMENT (N/A) Subjective: C/O headache and pain in sternal incision  Objective  Telemetry sinus rhythm   Temp:  [97.1 F (36.2 C)-98.6 F (37 C)] 98.5 F (36.9 C) (07/26 0538) Pulse Rate:  [70-75] 75  (07/26 0538) Resp:  [18] 18  (07/26 0538) BP: (119-142)/(71-84) 119/71 mmHg (07/26 0538) SpO2:  [91 %-93 %] 91 % (07/26 0538)   Intake/Output Summary (Last 24 hours) at 07/28/11 0756 Last data filed at 07/28/11 0700  Gross per 24 hour  Intake   1750 ml  Output     20 ml  Net   1730 ml       General appearance: alert, cooperative and no distress Heart: regular rate and rhythm and S1, S2 normal Lungs: clear to auscultation bilaterally Abdomen: benign Extremities: no edema Wound: vac in place  Lab Results:  [facility] 07/26/11 1933  NA 145  K 4.5  CL 106  CO2 28  GLUCOSE 110*  BUN 30*  CREATININE 1.09  CALCIUM 9.1  MG --  PHOS --   No results found for this basename: AST:2,ALT:2,ALKPHOS:2,BILITOT:2,PROT:2,ALBUMIN:2 in the last 72 hours No results found for this basename: LIPASE:2,AMYLASE:2 in the last 72 hours  Basename 07/26/11 1933  WBC 9.9  NEUTROABS --  HGB 11.2*  HCT 34.1*  MCV 88.3  PLT 280   No results found for this basename: CKTOTAL:4,CKMB:4,TROPONINI:4 in the last 72 hours No components found with this basename: POCBNP:3 No results found for this basename: DDIMER in the last 72 hours No results found for this basename: HGBA1C in the last 72 hours No results found for this basename: CHOL,HDL,LDLCALC,TRIG,CHOLHDL in the last 72 hours No results found for this basename: TSH,T4TOTAL,FREET3,T3FREE,THYROIDAB in the last 72 hours No results found for this basename: VITAMINB12,FOLATE,FERRITIN,TIBC,IRON,RETICCTPCT in the last 72 hours  Medications: Scheduled    . aspirin  325 mg Oral Daily    . atorvastatin  10 mg Oral q1800  . calcium-vitamin D  1 tablet Oral Daily  . cefTAZidime (FORTAZ)  IV  1 g Intravenous Q8H  . cholecalciferol  2,000 Units Oral Daily  . fentaNYL  50 mcg Transdermal Q72H  . fluticasone-salmeterol  2 puff Inhalation BID  . gabapentin  600 mg Oral QID  . metoprolol tartrate  12.5 mg Oral TID  . montelukast  10 mg Oral QHS  . multivitamin with minerals  1 tablet Oral Daily  . polyethylene glycol  17 g Oral Once  . vancomycin  1,000 mg Intravenous Q12H  . DISCONTD: fluticasone-salmeterol  2 puff Inhalation BID  . DISCONTD: hydroxychloroquine  200 mg Oral Daily     Radiology/Studies:  Dg Chest 2 View  07/27/2011  *RADIOLOGY REPORT*  Clinical Data: Preop sternal incision debridement  CHEST - 2 VIEW  Comparison: 07/17/2011  Findings: Very mild patchy right upper lobe opacity, possibly infectious. No pleural effusion or pneumothorax.  The heart is normal in size. Postsurgical changes related to prior CABG.  Degenerative changes of the visualized thoracolumbar spine.  IMPRESSION: Very mild patchy right upper lobe opacity, possibly infectious.  Original Report Authenticated By: Charline Bills, M.D.    INR: Will add last result for INR, ABG once components are  confirmed Will add last 4 CBG results once components are confirmed  Assessment/Plan: S/P Procedure(s) (LRB): STERNAL WOUND DEBRIDEMENT (N/A)  1. Cont VAC, current abx 2 change vac tomorrow, arrange for home therapy   LOS: 2 days    Mozel Burdett E 7/26/20137:56 AM

## 2011-07-28 NOTE — Discharge Summary (Signed)
Physician Discharge Summary  Patient ID: [name] Long MRN: [id number] DOB/AGE: [birth year] 70 y.o.  Admit date: 07/26/2011 Discharge date: 07/29/2011  Admission Diagnoses: 1.Superficial sternal wound infection 2.S/p CABG (January 2013) 3.History of fibromyalgia 4.History of asthma 5.History of hypertension 6.Remote history of tobacco abuse 7.History of chronic back pain  Discharge Diagnoses:  1.Superficial sternal wound infection 2.S/p CABG (January 2013) 3.History of fibromyalgia 4.History of asthma 5.History of hypertension 6.Remote history of tobacco abuse 7.History of chronic back pain   Procedure (s):  Superficial sternal debridement and placement of wound VAC  System by Dr. Donata Clay on 07/27/2011.  History of Presenting Illness: This is a 71 year old female (s/p Cabg in January 2013 ) who presented  to her primary care physician on 07/26/2011 with evidence of sternal wound infection. Her symptoms apparently started four days prior. She was sent to the [facility] office where Dr Maren Beach confirmed these findings.There was obvious evidence of cellulitis.The wound was opened and cultured in the office. She was then admitted to [facility] for IV antibiotics. Plans were also made for sternal wound debridement. Potential risks, complications, and benefits were discussed with the patient and she agreed to proceed. She underwent superficial sternal debridement and placement of a wound vac by Dr. Donata Clay on 07/27/2011.  Brief Hospital Course:  She has remained afebrile and hemodynamically stable.She was continued on Vancomycin and Fortaz.Her wound gram stain showed few gram positive cocci in pairs but wound culture showed no growth. She has been tolerating a diet. Once home health is able to be arranged for wound vac care and administration of IV antibiotics, she will stable for discharge.The length of her antibiotics will also be determined prior to discharge.   Latest Vital Signs: Blood  pressure 121/74, pulse 70, temperature 98.6 F (37 C), temperature source Oral, resp. rate 16, height 5\' 5"  (1.651 m), weight 158 lb 1.1 oz (71.7 kg), SpO2 92.00%.  Physical Exam: General appearance: alert, cooperative and no distress  Heart: regular rate and rhythm and S1, S2 normal  Lungs: clear to auscultation bilaterally  Abdomen: benign  Extremities: no edema  Wound: vac in place   Discharge Condition:Stable  Recent laboratory studies:  Lab Results  Component Value Date   WBC 9.9 07/26/2011   HGB 11.2* 07/26/2011   HCT 34.1* 07/26/2011   MCV 88.3 07/26/2011   PLT 280 07/26/2011   Lab Results  Component Value Date   NA 145 07/26/2011   K 4.5 07/26/2011   CL 106 07/26/2011   CO2 28 07/26/2011   CREATININE 1.09 07/26/2011   GLUCOSE 110* 07/26/2011      Diagnostic Studies: Dg Chest 2 View  07/27/2011  *RADIOLOGY REPORT*  Clinical Data: Preop sternal incision debridement  CHEST - 2 VIEW  Comparison: 07/17/2011  Findings: Very mild patchy right upper lobe opacity, possibly infectious. No pleural effusion or pneumothorax.  The heart is normal in size. Postsurgical changes related to prior CABG.  Degenerative changes of the visualized thoracolumbar spine.  IMPRESSION: Very mild patchy right upper lobe opacity, possibly infectious.  Original Report Authenticated By: Charline Bills, M.D.    Discharge Orders    Future Appointments: Provider: Department: Dept Phone: Center:   08/09/2011 4:30 PM [name], MD [facility] [phone] [facility]   08/15/2011 7:30 AM Sherrie George, MD [facility] [phone] None      Discharge Medications: Medication List  As of 07/28/2011 12:51 PM   STOP taking these medications  aspirin 81 MG EC tablet         TAKE these medications         albuterol 108 (90 BASE) MCG/ACT inhaler   Commonly known as: PROVENTIL HFA;VENTOLIN HFA   Inhale 1 puff into the lungs every 6 (six) hours as needed. For shortness of breath      aspirin  325 MG tablet   Take 1 tablet (325 mg total) by mouth daily.      CALCIUM + D PO   Take 1 tablet by mouth daily.      carisoprodol 350 MG tablet   Commonly known as: SOMA   Take 350 mg by mouth 4 (four) times daily as needed.      dextrose 5 % SOLN 50 mL with cefTAZidime 1 G SOLR 1 g   Inject 1 g into the vein every 12 (twelve) hours.      fentaNYL 50 MCG/HR   Commonly known as: DURAGESIC - dosed mcg/hr   Place 1 patch onto the skin every 3 (three) days.      Fluticasone-Salmeterol 250-50 MCG/DOSE Aepb   Commonly known as: ADVAIR   Inhale 1 puff into the lungs every morning.      gabapentin 600 MG tablet   Commonly known as: NEURONTIN   Take 600 mg by mouth 4 (four) times daily.      hydroxychloroquine 200 MG tablet   Commonly known as: PLAQUENIL   Take 400 mg by mouth every evening.      metoprolol tartrate 25 MG tablet   Commonly known as: LOPRESSOR   Take 25 mg by mouth 2 (two) times daily.      montelukast 10 MG tablet   Commonly known as: SINGULAIR   TAKE 1 TABLET ONCE A DAY      multivitamin with minerals Tabs   Take 1 tablet by mouth daily.      rosuvastatin 10 MG tablet   Commonly known as: CRESTOR   Take 1 tablet (10 mg total) by mouth daily.      traMADol 50 MG tablet   Commonly known as: ULTRAM   Take 1 tablet (50 mg total) by mouth every 6 (six) hours as needed for pain.      vancomycin 1 GM/200ML Soln   Commonly known as: VANCOCIN   Inject 200 mLs (1,000 mg total) into the vein every 12 (twelve) hours.            Follow Up Appointments: Follow-up Information    Follow up with VAN Dinah Beers, MD. (Appointment with Dr. Donata Clay is on 08/09/2011 at 4:30 pm)    Contact information:   [address] [place] [address] [phone]          Signed: Doree Fudge MPA-C 07/28/2011, 12:51 PM

## 2011-07-28 NOTE — Op Note (Signed)
[name] Long, MUNFORD NO.:  [id number]  MEDICAL RECORD NO.:  [id number]  LOCATION:  2034                         FACILITY:  [facility]  PHYSICIAN:  [name], M.D.  DATE OF BIRTH:  Mar 07, 1940  DATE OF PROCEDURE:  07/27/2011 DATE OF DISCHARGE:                              OPERATIVE REPORT   PROCEDURE:  Superficial sternal debridement and placement of wound VAC system.  PREOPERATIVE DIAGNOSIS:  Superficial sternal wound infection.  POSTOPERATIVE DIAGNOSIS:  Superficial sternal wound infection.  SURGEON:  [name], MD  ASSISTANT:  Rowe Clack, PA-C  ANESTHESIA:  General.  INDICATIONS:  The patient is a 71 year old nondiabetic female who presented to the office 24 hours previously with an inflamed anterior chest wall and fluctuant collection over the previous sternotomy from coronary artery bypass grafting performed in January, 6 months ago in 2013.  She states she had this problem for 4 days.  She states she had an episode of poison ivy over her chest and trunk and has scratched it vigorously.  Her previous recovery from bypass surgery 6 months ago was uneventful and she was signed off to the cardiologists in February with her sternal wound intact and healing normally.  She had an I and D in the office and was placed on IV antibiotics and prepared for a formal debridement in the operating room.  I obtained informed consent prior to this procedure.  OPERATIVE PROCEDURE:  The patient was brought to the operating room, placed supine on the operating table, where general anesthesia was induced.  A proper time-out was performed.  The chest was prepped and draped as usual sterile field.  The defect in her incision was extended to the full extent of the subcutaneous space.  This covered approximately 5 cm.  The sternal bone was healed.  The pectoralis fascia was intact.  There were no wires exposed.  There was purulent material, which was drained.   Unhealthy subcutaneous fat was sharply debrided. The wound was then irrigated with pulse lavage and 3 L of saline.  This left a clean granulation bed.  The wound VAC sponge was cut to the appropriate size, covered with a sterile silastic drape and connected to suction.  The patient was reversed from anesthesia and returned to recovery room in stable condition.  Stat Gram- stain on the material showed multiple white cells and few gram-positive cocci in pairs.     [name], M.D.     PV/MEDQ  D:  07/27/2011  T:  07/28/2011  Job:  [id number]  cc:   Nanetta Batty, M.D. Sharlot Gowda, M.D.

## 2011-07-28 NOTE — Progress Notes (Signed)
Pt complained of sudden onset severe discomfort of bladder, "fullness".  Pt stated that this has happened before when she had a CABG in January with [facility], charge nurse confirmed.  Bladder scan revealed >800 cc.  Wayne Gold notified, order placed for insertion of foley catheter.  Pt sees Dr. Laverle Patter of [facility] for this recurrent issue. [address] Filter

## 2011-07-28 NOTE — Clinical Documentation Improvement (Signed)
GENERIC DOCUMENTATION CLARIFICATION QUERY  THIS DOCUMENT IS NOT A PERMANENT PART OF THE MEDICAL RECORD  TO RESPOND TO THE THIS QUERY, FOLLOW THE INSTRUCTIONS BELOW:  1. If needed, update documentation for the patient's encounter via the notes activity.  2. Access this query again and click edit on the In [employer].  3. After updating, or not, click F2 to complete all highlighted (required) fields concerning your review. Select "additional documentation in the medical record" OR "no additional documentation provided".  4. Click Sign note button.  5. The deficiency will fall out of your In Basket *Please let us know if you are not able to complete this workflow by phone or e-mail (listed below).  Please update your documentation within the medical record to reflect your response to this query.                                                                                        07/28/11   Dear Dr.[facility] Maudie Flakes / Associates,  In a better effort to capture your patient's severity of illness, reflect appropriate length of stay and utilization of resources, a review of the patient medical record has revealed the following indicators.    Based on your clinical judgment, please clarify and document in a progress note and/or discharge summary the clinical condition associated with the following supporting information:  In responding to this query please exercise your independent judgment.  The fact that a query is asked, does not imply that any particular answer is desired or expected.   Possible Clinical Conditions?  _______UTI   _______Other Condition  _______Cannot Clinically Determine    Diagnostics: 7/24: Urinalysis results: Hazy appearance, positive nitrites, trace leukocytes. 7/24: Urine, micro results: Many bacteria, hyaline casts.   You may use possible, probable, or suspect with inpatient documentation. possible, probable, suspected diagnoses MUST be documented at the time  of discharge  Reviewed: No additional documentation provided.  Thank You,  Marciano Sequin,  Clinical Documentation Specialist:  Pager: [phone]  Health Information Management [place]

## 2011-07-28 NOTE — Progress Notes (Addendum)
ANTIBIOTIC CONSULT NOTE - INITIAL  Pharmacy Consult for Vancomycin Indication: Sternal Wound abscess, s/p debridement  No Known Allergies  Patient Measurements: Height: 5\' 5"  (165.1 cm) Weight: 158 lb 1.1 oz (71.7 kg) IBW/kg (Calculated) : 57  Adjusted Body Weight: 70kg  Vital Signs: Temp: 98.6 F (37 C) (07/26 1251) Temp src: Oral (07/26 1251) BP: 121/74 mmHg (07/26 1251) Pulse Rate: 70  (07/26 1251) Intake/Output from previous day: 07/25 0701 - 07/26 0700 In: 1750 [I.V.:1000; IV Piggyback:750] Out: 20 [Blood:20] Intake/Output from this shift: Total I/O In: 240 [P.O.:240] Out: 1500 [Urine:1500]  Labs:  [facility] 07/26/11 1933  WBC 9.9  HGB 11.2*  PLT 280  LABCREA --  CREATININE 1.09   Estimated Creatinine Clearance: 48.4 ml/min (by C-G formula based on Cr of 1.09).  Basename 07/28/11 1752  VANCOTROUGH 18.9  VANCOPEAK --  VANCORANDOM --  GENTTROUGH --  GENTPEAK --  GENTRANDOM --  TOBRATROUGH --  TOBRAPEAK --  TOBRARND --  AMIKACINPEAK --  AMIKACINTROU --  AMIKACIN --     Microbiology: Recent Results (from the past 720 hour(s))  WOUND CULTURE     Status: Normal (Preliminary result)   Collection Time   07/26/11  5:30 PM      Component Value Range Status Comment   GRAM STAIN Abundant   Preliminary    GRAM STAIN WBC present-predominately Mononuclear   Preliminary    GRAM STAIN No Squamous Epithelial Cells Seen   Preliminary    GRAM STAIN Rare GRAM POSITIVE COCCI IN PAIRS   Preliminary   SURGICAL PCR SCREEN     Status: Abnormal   Collection Time   07/26/11  6:09 PM      Component Value Range Status Comment   MRSA, PCR NEGATIVE  NEGATIVE Final    Staphylococcus aureus POSITIVE (*) NEGATIVE Final   WOUND CULTURE     Status: Normal (Preliminary result)   Collection Time   07/27/11  3:04 PM      Component Value Range Status Comment   Specimen Description WOUND CHEST   Final    Special Requests PATIENT ON FOLLOWING VANC,CEFTAZDINE   Final    Gram Stain      Final    Value: ABUNDANT WBC PRESENT,BOTH PMN AND MONONUCLEAR     FEW GRAM POSITIVE COCCI IN PAIRS     Performed at [facility]   Culture NO GROWTH   Final    Report Status PENDING   Incomplete   GRAM STAIN     Status: Normal   Collection Time   07/27/11  3:04 PM      Component Value Range Status Comment   Specimen Description WOUND CHEST   Final    Special Requests PATIENT ON FOLLOWING VANC,CEFTAZDINE   Final    Gram Stain     Final    Value: ABUNDANT WBC PRESENT,BOTH PMN AND MONONUCLEAR     FEW GRAM POSITIVE COCCI IN PAIRS   Report Status 07/27/2011 FINAL   Final     Medical History: Past Medical History  Diagnosis Date  . Allergy     RHINITIS  . Asthma   . Fibromyalgia   . Fibromyalgia   . Chronic low back pain   . Osteoporosis     OSTEOPENIA  . PPD positive   . Connective tissue disease   . Urinary incontinence   . Hypertension   . Crescendo angina, with EKG changes, new. 01/12/2011  . Abnormal EKG 01/12/2011  . Back pain, chronic--plans were for  cervical disc surgery week of 01/16/11 01/12/2011  . Asthma 01/12/2011    Medications:  Scheduled:    . aspirin  325 mg Oral Daily  . atorvastatin  10 mg Oral q1800  . calcium-vitamin D  1 tablet Oral Daily  . cefTAZidime (FORTAZ)  IV  1 g Intravenous Q12H  . cholecalciferol  2,000 Units Oral Daily  . fentaNYL  50 mcg Transdermal Q72H  . fluticasone-salmeterol  2 puff Inhalation BID  . gabapentin  600 mg Oral QID  . metoprolol tartrate  12.5 mg Oral TID  . montelukast  10 mg Oral QHS  . multivitamin with minerals  1 tablet Oral Daily  . polyethylene glycol  17 g Oral Once  . vancomycin  1,000 mg Intravenous Q12H  . DISCONTD: cefTAZidime (FORTAZ)  IV  1 g Intravenous Q8H  . DISCONTD: hydroxychloroquine  200 mg Oral Daily   Assessment: 71yo female with sternal wound abscess, s/p debridement pod#1.  Culture is (+)GPC- pairs and PICC line is being placed for home IV antibiotic therapy.  I recommended a trough for  this evening, and received OK for pharmacy to dose.  Goal of Therapy:  Vancomycin trough level 15-20 mcg/ml  Plan:  1.  Vancomycin trough at 6pm 2.  Adjust dosing as needed 3.  F/U on culture results.  Marisue Humble, PharmD Clinical Pharmacist [facility]   07/28/2011 6:40 PM   Vancomycin trough reported as 18.9 mcg/ml just before 1800 dose at steady state on 1g IV q12h.  Level reported at goal of 15-20 mcg/ml will continue on current regimen, consider troughs weekly on therapy unless otherwise clinically indicated.   Thank you for allowing pharmacy to be a part of this patients care team.  Lovenia Kim Pharm.D., BCPS Clinical Pharmacist 07/28/2011 6:43 PM Pager: [phone] Phone: [phone]

## 2011-07-28 NOTE — Progress Notes (Signed)
PHARMACIST - PHYSICIAN COMMUNICATION DR:   Maren Beach, et.al.  CONCERNING:   1.  Pt is on Vancomycin 1000mg  IV q12 and Fortaz 1gm IV q8 for sternal wound abscess, now s/p debridement.  Estimated CrCl ~38ml/min.  Vancomycin dosing may be a bit aggressive.  Noted plans for PICC line placement for home abx therapy.  2.  Pt has no VTE px ordered   RECOMMENDATION: 1.  Recommend checking Vancomycin Trough.  Pharmacy is not dosing Vanc, but would be happy to evaluate the level and adjust dosing if needed.  Please consult pharmacy for this if desired.  2.  Will adjust Fortaz to 1gm IV q12 for renal fxn, per P&T cmte  3.  Recommend ordering Lovenox or Heparin for VTE px, or SCDs if non-pharmacologic prophylaxis is preferred.   Marisue Humble, PharmD Clinical Pharmacist [facility]

## 2011-07-28 NOTE — Consult Note (Signed)
WOC contacted [facility] regarding first post op VAC dressing change ordered for tomorrow. PA will change dressing tomorrow with assistance of bedside nursing.  WOC will order supplies for the staff to be at bedside for this dressing.   Cayci Mcnabb [place], [place] [phone]

## 2011-07-29 LAB — WOUND CULTURE: Gram Stain: NONE SEEN

## 2011-07-29 MED ORDER — ONDANSETRON HCL 4 MG/2ML IJ SOLN
4.0000 mg | Freq: Four times a day (QID) | INTRAMUSCULAR | Status: DC | PRN
  Administered 2011-07-29: 4 mg via INTRAVENOUS
  Filled 2011-07-29: qty 2

## 2011-07-29 MED ORDER — HYDROMORPHONE HCL PF 1 MG/ML IJ SOLN
1.5000 mg | Freq: Once | INTRAMUSCULAR | Status: AC
  Administered 2011-07-29: 1.5 mg via INTRAVENOUS

## 2011-07-29 MED ORDER — MIDAZOLAM HCL 2 MG/2ML IJ SOLN
1.0000 mg | Freq: Once | INTRAMUSCULAR | Status: AC
  Administered 2011-07-29: 1 mg via INTRAVENOUS

## 2011-07-29 MED ORDER — SODIUM CHLORIDE 0.9 % IJ SOLN
10.0000 mL | INTRAMUSCULAR | Status: DC | PRN
  Administered 2011-07-29 – 2011-08-04 (×5): 10 mL

## 2011-07-29 MED ORDER — HYDROMORPHONE HCL PF 1 MG/ML IJ SOLN
INTRAMUSCULAR | Status: AC
  Filled 2011-07-29: qty 2

## 2011-07-29 MED ORDER — CEFTAZIDIME 1 G IJ SOLR
1.0000 g | Freq: Two times a day (BID) | INTRAMUSCULAR | Status: DC
  Administered 2011-07-30 – 2011-07-31 (×3): 1 g via INTRAVENOUS
  Filled 2011-07-29 (×4): qty 1

## 2011-07-29 NOTE — Progress Notes (Signed)
Peripherally Inserted Central Catheter/Midline Placement  The IV Nurse has discussed with the patient and/or persons authorized to consent for the patient, the purpose of this procedure and the potential benefits and risks involved with this procedure.  The benefits include less needle sticks, lab draws from the catheter and patient may be discharged home with the catheter.  Risks include, but not limited to, infection, bleeding, blood clot (thrombus formation), and puncture of an artery; nerve damage and irregular heat beat.  Alternatives to this procedure were also discussed.  PICC/Midline Placement Documentation        [name] Long 07/29/2011, 9:49 AM

## 2011-07-29 NOTE — Progress Notes (Signed)
Assisted PA with midsternal dressing change. Patient received Versed 1mg  and a total of Dilaudid 2 mg IV in divided doses. Patient did not tolerate pain well, as she has chronic pain and takes Dilaudid po at home. Patient mentioned plan for her is to be discharged Monday (with [facility]) and is worried how she will tolerate the pain of dressing changes without IV pain meds available to her. Plan is to change dressing again Monday and to assess pain tolerance then. [name] Long

## 2011-07-29 NOTE — Progress Notes (Addendum)
[address]            [name] [address]          [phone]     2 Days Post-Op  Procedure(s) (LRB): STERNAL WOUND DEBRIDEMENT (N/A) Subjective: Conts to feel better, still some headaches  Objective  Telemetry sinus rhythm/brady  Temp:  [97.7 F (36.5 C)-98.7 F (37.1 C)] 97.7 F (36.5 C) (07/27 0545) Pulse Rate:  [64-70] 64  (07/27 0545) Resp:  [16-18] 18  (07/27 0545) BP: (104-121)/(60-74) 104/60 mmHg (07/27 0545) SpO2:  [92 %] 92 % (07/27 0545)   Intake/Output Summary (Last 24 hours) at 07/29/11 1208 Last data filed at 07/29/11 0700  Gross per 24 hour  Intake    240 ml  Output   1822 ml  Net  -1582 ml       General appearance: alert, cooperative and no distress Wound: Vac changed - wound with excellent granulation tissue and bleeding from edges, no purulence, erethema is significantly improved.  Lab Results:  [facility] 07/26/11 1933  NA 145  K 4.5  CL 106  CO2 28  GLUCOSE 110*  BUN 30*  CREATININE 1.09  CALCIUM 9.1  MG --  PHOS --   No results found for this basename: AST:2,ALT:2,ALKPHOS:2,BILITOT:2,PROT:2,ALBUMIN:2 in the last 72 hours No results found for this basename: LIPASE:2,AMYLASE:2 in the last 72 hours  Basename 07/26/11 1933  WBC 9.9  NEUTROABS --  HGB 11.2*  HCT 34.1*  MCV 88.3  PLT 280   No results found for this basename: CKTOTAL:4,CKMB:4,TROPONINI:4 in the last 72 hours No components found with this basename: POCBNP:3 No results found for this basename: DDIMER in the last 72 hours No results found for this basename: HGBA1C in the last 72 hours No results found for this basename: CHOL,HDL,LDLCALC,TRIG,CHOLHDL in the last 72 hours No results found for this basename: TSH,T4TOTAL,FREET3,T3FREE,THYROIDAB in the last 72 hours No results found for this basename: VITAMINB12,FOLATE,FERRITIN,TIBC,IRON,RETICCTPCT in the last 72 hours  Medications: Scheduled    . aspirin  325 mg Oral Daily  .  atorvastatin  10 mg Oral q1800  . calcium-vitamin D  1 tablet Oral Daily  . cefTAZidime (FORTAZ)  IV  1 g Intravenous Q12H  . cholecalciferol  2,000 Units Oral Daily  . fentaNYL  50 mcg Transdermal Q72H  . fluticasone-salmeterol  2 puff Inhalation BID  . gabapentin  600 mg Oral QID  . HYDROmorphone      . metoprolol tartrate  12.5 mg Oral TID  . midazolam  1 mg Intravenous Once  . montelukast  10 mg Oral QHS  . multivitamin with minerals  1 tablet Oral Daily  . polyethylene glycol  17 g Oral Once  . vancomycin  1,000 mg Intravenous Q12H     Radiology/Studies:  No results found.  INR: Will add last result for INR, ABG once components are confirmed Will add last 4 CBG results once components are confirmed  Assessment/Plan: S/P Procedure(s) (LRB): STERNAL WOUND DEBRIDEMENT (N/A)  1. Vac changed with 2 MG Dilaudid/1 MG versed IV, poor toleration of pain, but overall tolerated the procedure fairy well. As a chronic pain patient, this will be difficult in home setting.  2. Leave foley and poss voiding trial tomorrow   LOS: 3 days    GOLD,WAYNE E 7/27/201312:08 PM  I have seen and examined the patient and agree with the assessment and plan as  outlined.  Doak Mah H 07/29/2011 12:50 PM

## 2011-07-30 LAB — WOUND CULTURE

## 2011-07-30 NOTE — Progress Notes (Signed)
Pt ambulated in hallway 400 ft with rolling walker and assist X 1. Pt tolerated activity well. Will continue to monitor.

## 2011-07-30 NOTE — Progress Notes (Signed)
Pt ambulated in hallway 350 ft with rolling walker and assist X 1. Pt tolerated activity well. Will continue to monitor.

## 2011-07-30 NOTE — Progress Notes (Signed)
Patient's family very concerned about pain control during tomorrow's MSI wound VAC dressing change. Discussed the possibility of having an order for pain meds to be given prior to dressing change so that meds have time to be effective. Discussed same with PA. Dr. Maren Beach will decide how to proceed in the AM. Patient highly anxious. Discussed possible low threshold to pain in addition to high tolerance for pain medication. MD sticky note placed in EPIC yesterday to alert MD that patient tolerated dressing change poorly. Reassured both patient and family. Harlow Asa

## 2011-07-30 NOTE — Progress Notes (Signed)
Pt ambulated 350 ft with rolling walker and assist x 1. Pt tolerated activity well. Will continue to monitor.

## 2011-07-30 NOTE — Progress Notes (Addendum)
[address]            [name] Long [address]          [phone]     3 Days Post-Op  Procedure(s) (LRB): STERNAL WOUND DEBRIDEMENT (N/A) Subjective: Very anxious about pain control during dressing changes at home. She is very functional on chronic meds , but very highly tolerent.   Objective  Telemetry sinus rhythm  Temp:  [97.8 F (36.6 C)-98.2 F (36.8 C)] 98.2 F (36.8 C) (07/28 0321) Pulse Rate:  [67-77] 77  (07/28 0321) Resp:  [18] 18  (07/28 0321) BP: (118-141)/(73-79) 118/75 mmHg (07/28 0321) SpO2:  [92 %-95 %] 94 % (07/28 0321) Weight:  [166 lb 11.2 oz (75.615 kg)] 166 lb 11.2 oz (75.615 kg) (07/28 0321)   Intake/Output Summary (Last 24 hours) at 07/30/11 0909 Last data filed at 07/30/11 0300  Gross per 24 hour  Intake      0 ml  Output   1920 ml  Net  -1920 ml       General appearance: alert, cooperative and no distress Wound: vac in place, erethema resolved  Lab Results: No results found for this basename: NA:2,K:2,CL:2,CO2:2,GLUCOSE:2,BUN:2,CREATININE:2,CALCIUM:2,MG:2,PHOS:2 in the last 72 hours No results found for this basename: AST:2,ALT:2,ALKPHOS:2,BILITOT:2,PROT:2,ALBUMIN:2 in the last 72 hours No results found for this basename: LIPASE:2,AMYLASE:2 in the last 72 hours No results found for this basename: WBC:2,NEUTROABS:2,HGB:2,HCT:2,MCV:2,PLT:2 in the last 72 hours No results found for this basename: CKTOTAL:4,CKMB:4,TROPONINI:4 in the last 72 hours No components found with this basename: POCBNP:3 No results found for this basename: DDIMER in the last 72 hours No results found for this basename: HGBA1C in the last 72 hours No results found for this basename: CHOL,HDL,LDLCALC,TRIG,CHOLHDL in the last 72 hours No results found for this basename: TSH,T4TOTAL,FREET3,T3FREE,THYROIDAB in the last 72 hours No results found for this basename: VITAMINB12,FOLATE,FERRITIN,TIBC,IRON,RETICCTPCT in the last 72  hours  Medications: Scheduled    . aspirin  325 mg Oral Daily  . atorvastatin  10 mg Oral q1800  . calcium-vitamin D  1 tablet Oral Daily  . cefTAZidime (FORTAZ)  IV  1 g Intravenous Q12H  . cholecalciferol  2,000 Units Oral Daily  . fentaNYL  50 mcg Transdermal Q72H  . fluticasone-salmeterol  2 puff Inhalation BID  . gabapentin  600 mg Oral QID  . HYDROmorphone      .  HYDROmorphone (DILAUDID) injection  1.5 mg Intravenous Once  . metoprolol tartrate  12.5 mg Oral TID  . midazolam  1 mg Intravenous Once  . montelukast  10 mg Oral QHS  . multivitamin with minerals  1 tablet Oral Daily  . polyethylene glycol  17 g Oral Once  . vancomycin  1,000 mg Intravenous Q12H  . DISCONTD: cefTAZidime (FORTAZ)  IV  1 g Intravenous Q12H     Radiology/Studies:  No results found.  INR: Will add last result for INR, ABG once components are confirmed Will add last 4 CBG results once components are confirmed  Assessment/Plan: S/P Procedure(s) (LRB): STERNAL WOUND DEBRIDEMENT (N/A)  1. Stable, will need to determine pain meds for dressing change 2 staph -MSSA,, change from vanco to po med in next day or so 3 will need to contact urology for follow up - will leave foley for removal as outpatient as history of urinary retention/ foley need from previous hospitalizations well documented.   LOS: 4 days  [name] Long,[name] Long 7/28/20139:09 AM     Specimen Description:    WOUND CHEST   Special Requests:    PATIENT ON FOLLOWING VANC,CEFTAZDINE   Gram Stain:    ABUNDANT WBC PRESENT,BOTH PMN AND MONONUCLEAR FEW GRAM POSITIVE COCCI IN PAIRS Performed at [facility]   Culture:    MODERATE STAPHYLOCOCCUS AUREUS Note: RIFAMPIN AND GENTAMICIN SHOULD NOT BE USED AS SINGLE DRUGS FOR TREATMENT OF STAPH INFECTIONS.   Report Status:    07/30/2011 FINAL   Organism ID, Bacteria:    STAPHYLOCOCCUS AUREUS      Culture & Susceptibility     STAPHYLOCOCCUS AUREUS        Antibiotic  Sensitivity   Microscan  Status     CLINDAMYCIN  Sensitive  <=0.25  Final     Method:  MIC     ERYTHROMYCIN  Sensitive  <=0.25  Final     Method:  MIC     GENTAMICIN  Sensitive  <=0.5  Final     Method:  MIC     LEVOFLOXACIN  Sensitive  0.25  Final     Method:  MIC     MOXIFLOXACIN  Sensitive  <=0.25  Final     Method:  MIC     OXACILLIN  Sensitive  0.5  Final     Method:  MIC     PENICILLIN  Resistant  >=0.5  Final     Method:  MIC     RIFAMPIN  Sensitive  <=0.5  Final     Method:  MIC     TETRACYCLINE  Sensitive  <=1  Final     Method:  MIC     TRIMETH/SULFA  Sensitive  <=10  Final     Method:  MIC     VANCOMYCIN  Sensitive  1  Final     Method:  MIC      Comments  STAPHYLOCOCCUS AUREUS (MIC)       MODERATE STAPHYLOCOCCUS AUREUS    I have seen and examined the patient and agree with the assessment and plan as outlined.  OWEN,CLARENCE H 07/30/2011 12:09 PM

## 2011-07-31 MED ORDER — FENTANYL CITRATE 0.05 MG/ML IJ SOLN
50.0000 ug | Freq: Once | INTRAMUSCULAR | Status: AC
  Administered 2011-07-31: 50 ug via INTRAVENOUS
  Filled 2011-07-31: qty 2

## 2011-07-31 MED ORDER — CEFAZOLIN SODIUM-DEXTROSE 2-3 GM-% IV SOLR
2.0000 g | Freq: Three times a day (TID) | INTRAVENOUS | Status: DC
  Administered 2011-07-31 – 2011-08-04 (×13): 2 g via INTRAVENOUS
  Filled 2011-07-31 (×16): qty 50

## 2011-07-31 MED ORDER — MIDAZOLAM HCL 2 MG/2ML IJ SOLN
2.0000 mg | Freq: Once | INTRAMUSCULAR | Status: AC
  Administered 2011-07-31: 2 mg via INTRAVENOUS
  Filled 2011-07-31: qty 2

## 2011-07-31 MED ORDER — CEFAZOLIN SODIUM 1 G IJ SOLR
2.0000 g | Freq: Three times a day (TID) | INTRAMUSCULAR | Status: DC
  Filled 2011-07-31 (×3): qty 20

## 2011-07-31 NOTE — Consult Note (Addendum)
WOC consult Note Reason for Consult: VAC dressing change, pt with need for IV pain medication for VAC dressing change Wound type:surgical Measurement:7cm x 3cm x 1.0cm Wound bed: beefy red, with early granulation tissue, no necrotic tissue present Drainage (amount, consistency, odor) serosanguineous in canister Periwound: intact, but noted black granufoam cut a little larger than wound bed, therefore wound edges and periwound with some moisture and dimpling Dressing procedure/placement/frequency: cut to fit Mepitel to decrease pain with next dressing change placed in wound bed first, 1pc black granufoam placed in the wound bed, with larger pc to accommodate TRAC pad. Pt tolerated with IV pain meds administered by the bedside nurse. Discussed with pt. That we will need to try dressing change with PO pain meds to see if she can tolerate dc to home with [facility].  WOC will follow along with you for [facility] management Melody Marlena Clipper, [facility] [phone]     Lestine Box and and I agree with the plan  - P Donata Clay MD and

## 2011-07-31 NOTE — Progress Notes (Addendum)
                   [address]            [place] [address]          [phone]     4 Days Post-Op  Procedure(s) (LRB): STERNAL WOUND DEBRIDEMENT (N/A) Subjective: Incision is painfull  Objective  Telemetry sinus rhythm  Temp:  [97.8 F (36.6 C)-98 F (36.7 C)] 98 F (36.7 C) (07/29 0532) Pulse Rate:  [62-65] 62  (07/29 0532) Resp:  [18] 18  (07/29 0532) BP: (116-157)/(77-88) 154/82 mmHg (07/29 0618) SpO2:  [92 %-95 %] 95 % (07/29 0532) Weight:  [167 lb 3.2 oz (75.841 kg)] 167 lb 3.2 oz (75.841 kg) (07/29 0532)   Intake/Output Summary (Last 24 hours) at 07/31/11 0721 Last data filed at 07/31/11 0500  Gross per 24 hour  Intake      0 ml  Output   3750 ml  Net  -3750 ml       General appearance: alert, cooperative and no distress Heart: regular rate and rhythm and S1, S2 normal Lungs: clear to auscultation bilaterally Abdomen: benign Wound: vac in place, minimal erethema  Lab Results: No results found for this basename: NA:2,K:2,CL:2,CO2:2,GLUCOSE:2,BUN:2,CREATININE:2,CALCIUM:2,MG:2,PHOS:2 in the last 72 hours No results found for this basename: AST:2,ALT:2,ALKPHOS:2,BILITOT:2,PROT:2,ALBUMIN:2 in the last 72 hours No results found for this basename: LIPASE:2,AMYLASE:2 in the last 72 hours No results found for this basename: WBC:2,NEUTROABS:2,HGB:2,HCT:2,MCV:2,PLT:2 in the last 72 hours No results found for this basename: CKTOTAL:4,CKMB:4,TROPONINI:4 in the last 72 hours No components found with this basename: POCBNP:3 No results found for this basename: DDIMER in the last 72 hours No results found for this basename: HGBA1C in the last 72 hours No results found for this basename: CHOL,HDL,LDLCALC,TRIG,CHOLHDL in the last 72 hours No results found for this basename: TSH,T4TOTAL,FREET3,T3FREE,THYROIDAB in the last 72 hours No results found for this basename: VITAMINB12,FOLATE,FERRITIN,TIBC,IRON,RETICCTPCT in the last 72  hours  Medications: Scheduled    . aspirin  325 mg Oral Daily  . atorvastatin  10 mg Oral q1800  . calcium-vitamin D  1 tablet Oral Daily  . cefTAZidime (FORTAZ)  IV  1 g Intravenous Q12H  . cholecalciferol  2,000 Units Oral Daily  . fentaNYL  50 mcg Transdermal Q72H  . fluticasone-salmeterol  2 puff Inhalation BID  . gabapentin  600 mg Oral QID  . metoprolol tartrate  12.5 mg Oral TID  . montelukast  10 mg Oral QHS  . multivitamin with minerals  1 tablet Oral Daily  . polyethylene glycol  17 g Oral Once  . vancomycin  1,000 mg Intravenous Q12H     Radiology/Studies:  No results found.  INR: Will add last result for INR, ABG once components are confirmed Will add last 4 CBG results once components are confirmed  Assessment/Plan: S/P Procedure(s) (LRB): STERNAL WOUND DEBRIDEMENT (N/A)  1. Will discuss with surgeon plans for dressing changes and foley management     LOS: 5 days    GOLD,WAYNE E 7/29/20137:21 AM    patient examined and medical record reviewed,agree with above note. VAN TRIGT III,PETER 07/31/2011

## 2011-07-31 NOTE — Progress Notes (Signed)
Pt reports improved pain control at dressing change today   - medicated per orders with versed and fentanyl at procedure time.   02 2liters applied, 127/74 , hr 74.

## 2011-08-01 ENCOUNTER — Inpatient Hospital Stay (HOSPITAL_COMMUNITY): Payer: Medicare Other

## 2011-08-01 NOTE — Progress Notes (Signed)
Foley discontinued per MD order at 1030 am. Will continue to monitor output. [name] Long

## 2011-08-01 NOTE — Progress Notes (Addendum)
                   [address]            [place] [address]          [phone]     5 Days Post-Op  Procedure(s) (LRB): STERNAL WOUND DEBRIDEMENT (N/A) Subjective: Feels well, much better with pain meds at dressing change yesterday  Objective  Telemetry sinus  Temp:  [97.1 F (36.2 C)-97.8 F (36.6 C)] 97.8 F (36.6 C) (07/30 0308) Pulse Rate:  [64-76] 70  (07/30 0308) Resp:  [18] 18  (07/30 0308) BP: (121-160)/(70-85) 129/70 mmHg (07/30 0308) SpO2:  [93 %-98 %] 94 % (07/30 0308) Weight:  [160 lb 4.8 oz (72.712 kg)] 160 lb 4.8 oz (72.712 kg) (07/30 0423)   Intake/Output Summary (Last 24 hours) at 08/01/11 9147 Last data filed at 08/01/11 0429  Gross per 24 hour  Intake    720 ml  Output   1553 ml  Net   -833 ml       Wound: vac in place-stable appearance  Lab Results: No results found for this basename: NA:2,K:2,CL:2,CO2:2,GLUCOSE:2,BUN:2,CREATININE:2,CALCIUM:2,MG:2,PHOS:2 in the last 72 hours No results found for this basename: AST:2,ALT:2,ALKPHOS:2,BILITOT:2,PROT:2,ALBUMIN:2 in the last 72 hours No results found for this basename: LIPASE:2,AMYLASE:2 in the last 72 hours No results found for this basename: WBC:2,NEUTROABS:2,HGB:2,HCT:2,MCV:2,PLT:2 in the last 72 hours No results found for this basename: CKTOTAL:4,CKMB:4,TROPONINI:4 in the last 72 hours No components found with this basename: POCBNP:3 No results found for this basename: DDIMER in the last 72 hours No results found for this basename: HGBA1C in the last 72 hours No results found for this basename: CHOL,HDL,LDLCALC,TRIG,CHOLHDL in the last 72 hours No results found for this basename: TSH,T4TOTAL,FREET3,T3FREE,THYROIDAB in the last 72 hours No results found for this basename: VITAMINB12,FOLATE,FERRITIN,TIBC,IRON,RETICCTPCT in the last 72 hours  Medications: Scheduled    . aspirin  325 mg Oral Daily  . atorvastatin  10 mg Oral q1800  . calcium-vitamin D  1 tablet Oral Daily  .   ceFAZolin (ANCEF) IV  2 g Intravenous Q8H  . cholecalciferol  2,000 Units Oral Daily  . fentaNYL  50 mcg Transdermal Q72H  . fentaNYL  50 mcg Intravenous Once  . fluticasone-salmeterol  2 puff Inhalation BID  . gabapentin  600 mg Oral QID  . metoprolol tartrate  12.5 mg Oral TID  . midazolam  2 mg Intravenous Once  . montelukast  10 mg Oral QHS  . multivitamin with minerals  1 tablet Oral Daily  . polyethylene glycol  17 g Oral Once  . DISCONTD: ceFAZolin  2 g Intramuscular Q8H     Radiology/Studies:  No results found.  INR: Will add last result for INR, ABG once components are confirmed Will add last 4 CBG results once components are confirmed  Assessment/Plan: S/P Procedure(s) (LRB): STERNAL WOUND DEBRIDEMENT (N/A)  1. Will try oral meds with dressing change tomorrow. 2 try voiding trial   LOS: 6 days    GOLD,WAYNE E 7/30/20138:22 AM    patient examined and medical record reviewed,agree with above note. VAN TRIGT III,PETER 08/01/2011

## 2011-08-02 MED ORDER — METOPROLOL TARTRATE 25 MG PO TABS
25.0000 mg | ORAL_TABLET | Freq: Two times a day (BID) | ORAL | Status: DC
  Administered 2011-08-02 – 2011-08-04 (×5): 25 mg via ORAL
  Filled 2011-08-02 (×6): qty 1

## 2011-08-02 MED ORDER — OXYCODONE HCL 5 MG PO TABS
10.0000 mg | ORAL_TABLET | ORAL | Status: DC | PRN
  Administered 2011-08-02 – 2011-08-04 (×3): 10 mg via ORAL
  Filled 2011-08-02 (×3): qty 2

## 2011-08-02 NOTE — Plan of Care (Signed)
Problem: Phase III Progression Outcomes Goal: Wound care performed by pt/family Outcome: Not Applicable Date Met:  08/02/11 Pt has wound vac

## 2011-08-02 NOTE — Progress Notes (Signed)
                   [address]            [place] [address]          [phone]      Tolerated dressing change, but c/o urinary frequency and burning.   With previous bladder history she doesn't want to go home till sx more resolved. Will recheck ua as well

## 2011-08-02 NOTE — Progress Notes (Signed)
                   [address]            [place] [address]          [phone]     6 Days Post-Op  Procedure(s) (LRB): STERNAL WOUND DEBRIDEMENT (N/A) Subjective: Feels well   Objective  Telemetry sinus, pac's  Temp:  [97.1 F (36.2 C)-98.2 F (36.8 C)] 98.2 F (36.8 C) (07/31 0500) Pulse Rate:  [69-74] 70  (07/31 0500) Resp:  [18] 18  (07/31 0500) BP: (127-144)/(72-75) 127/75 mmHg (07/31 0500) SpO2:  [93 %-95 %] 93 % (07/31 0500) Weight:  [162 lb 11.2 oz (73.8 kg)] 162 lb 11.2 oz (73.8 kg) (07/31 0500)   Intake/Output Summary (Last 24 hours) at 08/02/11 0837 Last data filed at 08/01/11 1700  Gross per 24 hour  Intake    840 ml  Output   1500 ml  Net   -660 ml       Wound: vac in place , no erethema  Lab Results: No results found for this basename: NA:2,K:2,CL:2,CO2:2,GLUCOSE:2,BUN:2,CREATININE:2,CALCIUM:2,MG:2,PHOS:2 in the last 72 hours No results found for this basename: AST:2,ALT:2,ALKPHOS:2,BILITOT:2,PROT:2,ALBUMIN:2 in the last 72 hours No results found for this basename: LIPASE:2,AMYLASE:2 in the last 72 hours No results found for this basename: WBC:2,NEUTROABS:2,HGB:2,HCT:2,MCV:2,PLT:2 in the last 72 hours No results found for this basename: CKTOTAL:4,CKMB:4,TROPONINI:4 in the last 72 hours No components found with this basename: POCBNP:3 No results found for this basename: DDIMER in the last 72 hours No results found for this basename: HGBA1C in the last 72 hours No results found for this basename: CHOL,HDL,LDLCALC,TRIG,CHOLHDL in the last 72 hours No results found for this basename: TSH,T4TOTAL,FREET3,T3FREE,THYROIDAB in the last 72 hours No results found for this basename: VITAMINB12,FOLATE,FERRITIN,TIBC,IRON,RETICCTPCT in the last 72 hours  Medications: Scheduled    . aspirin  325 mg Oral Daily  . atorvastatin  10 mg Oral q1800  . calcium-vitamin D  1 tablet Oral Daily  .  ceFAZolin (ANCEF) IV  2 g Intravenous Q8H  .  cholecalciferol  2,000 Units Oral Daily  . fentaNYL  50 mcg Transdermal Q72H  . fluticasone-salmeterol  2 puff Inhalation BID  . gabapentin  600 mg Oral QID  . metoprolol tartrate  12.5 mg Oral TID  . montelukast  10 mg Oral QHS  . multivitamin with minerals  1 tablet Oral Daily  . polyethylene glycol  17 g Oral Once     Radiology/Studies:  Dg Chest Port 1 View  08/01/2011  *RADIOLOGY REPORT*  Clinical Data: PICC placement.  PORTABLE CHEST - 1 VIEW  Comparison: 07/27/2011.  Findings: Normal sized heart.  Stable post CABG changes.  Clear lungs.  Interval right PICC with its tip in the inferior aspect of the superior vena cava.  Mild lower thoracic spine degenerative changes.  IMPRESSION: Right PICC tip in the inferior aspect of the superior vena cava. If a position at the cavoatrial junction is desired, this could be advanced 2 cm.  Original Report Authenticated By: Darrol Angel, M.D.    INR: Will add last result for INR, ABG once components are confirmed Will add last 4 CBG results once components are confirmed  Assessment/Plan: S/P Procedure(s) (LRB): STERNAL WOUND DEBRIDEMENT (N/A)  For dressing change today with PO analgesics. Will see how tolerated Increase b blocker dose Cont ancef   LOS: 7 days    Arriyanna Mersch E 7/31/20138:37 AM

## 2011-08-02 NOTE — Consult Note (Signed)
WOC follow up NPWT dressing removed without difficulty today, pt tolerated well.   She did receive PO pain med 1 hour prior to me coming to change dressing.  Wound type: surgical/sternum Wound bed: pink and moist, early granulation Drainage (amount, consistency, odor) serosanguinous in the canister Periwound: intact without problems Dressing procedure/placement/frequency:  1pc of Mepitel placed in the wound bed first.  1pc black granufoam cut to fit into wound bed, covered with drape and black granufoam used on top to accommodate the [facility] pad.  Labeled tubing and gave remaining mepitel to pt to take for home use with next change and notified [facility] coordinator with [facility] the need for use of this product.    WOC will follow along with you for assistance with VAC. Marigold Mom [place], [place] [phone]

## 2011-08-03 LAB — URINALYSIS, ROUTINE W REFLEX MICROSCOPIC
Bilirubin Urine: NEGATIVE
Hgb urine dipstick: NEGATIVE
Specific Gravity, Urine: <1.005 — ABNORMAL LOW (ref 1.005–1.030)
pH: 7 (ref 5.0–8.0)

## 2011-08-03 MED ORDER — CEFAZOLIN SODIUM-DEXTROSE 2-3 GM-% IV SOLR: 1.0000 g | Freq: Three times a day (TID) | INTRAVENOUS | Status: DC

## 2011-08-03 MED ORDER — OXYCODONE HCL 10 MG PO TABS: 10.0000 mg | ORAL_TABLET | ORAL | Status: DC | PRN

## 2011-08-03 MED ORDER — CIPROFLOXACIN HCL 250 MG PO TABS: 250.0000 mg | ORAL_TABLET | Freq: Two times a day (BID) | ORAL | Status: AC

## 2011-08-03 MED ORDER — CIPROFLOXACIN HCL 250 MG PO TABS
250.0000 mg | ORAL_TABLET | Freq: Two times a day (BID) | ORAL | Status: DC
  Administered 2011-08-03 – 2011-08-04 (×3): 250 mg via ORAL
  Filled 2011-08-03 (×5): qty 1

## 2011-08-03 NOTE — Progress Notes (Addendum)
[address]            [place] [address]          [phone]     7 Days Post-Op  Procedure(s) (LRB): STERNAL WOUND DEBRIDEMENT (N/A) Subjective: Had to have foley replaced for retention  Objective  Telemetry sinus  Temp:  [97.4 F (36.3 C)-98 F (36.7 C)] 97.7 F (36.5 C) (08/01 0458) Pulse Rate:  [64-77] 65  (08/01 0458) Resp:  [18] 18  (08/01 0458) BP: (136-154)/(80-82) 154/80 mmHg (08/01 0458) SpO2:  [93 %-99 %] 95 % (08/01 0458) Weight:  [162 lb 11.2 oz (73.8 kg)] 162 lb 11.2 oz (73.8 kg) (08/01 0458)   Intake/Output Summary (Last 24 hours) at 08/03/11 0810 Last data filed at 08/03/11 0615  Gross per 24 hour  Intake   1080 ml  Output    600 ml  Net    480 ml       General appearance: alert, cooperative and no distress Wound: vab appears stable  Lab Results: No results found for this basename: NA:2,K:2,CL:2,CO2:2,GLUCOSE:2,BUN:2,CREATININE:2,CALCIUM:2,MG:2,PHOS:2 in the last 72 hours No results found for this basename: AST:2,ALT:2,ALKPHOS:2,BILITOT:2,PROT:2,ALBUMIN:2 in the last 72 hours No results found for this basename: LIPASE:2,AMYLASE:2 in the last 72 hours No results found for this basename: WBC:2,NEUTROABS:2,HGB:2,HCT:2,MCV:2,PLT:2 in the last 72 hours No results found for this basename: CKTOTAL:4,CKMB:4,TROPONINI:4 in the last 72 hours No components found with this basename: POCBNP:3 No results found for this basename: DDIMER in the last 72 hours No results found for this basename: HGBA1C in the last 72 hours No results found for this basename: CHOL,HDL,LDLCALC,TRIG,CHOLHDL in the last 72 hours No results found for this basename: TSH,T4TOTAL,FREET3,T3FREE,THYROIDAB in the last 72 hours No results found for this basename: VITAMINB12,FOLATE,FERRITIN,TIBC,IRON,RETICCTPCT in the last 72 hours  Medications: Scheduled    . aspirin  325 mg Oral Daily  . atorvastatin  10 mg Oral q1800  . calcium-vitamin D  1 tablet  Oral Daily  .  ceFAZolin (ANCEF) IV  2 g Intravenous Q8H  . cholecalciferol  2,000 Units Oral Daily  . fentaNYL  50 mcg Transdermal Q72H  . fluticasone-salmeterol  2 puff Inhalation BID  . gabapentin  600 mg Oral QID  . metoprolol tartrate  25 mg Oral BID  . montelukast  10 mg Oral QHS  . multivitamin with minerals  1 tablet Oral Daily  . polyethylene glycol  17 g Oral Once  . DISCONTD: metoprolol tartrate  12.5 mg Oral TID     Radiology/Studies:  Dg Chest Port 1 View  08/01/2011  *RADIOLOGY REPORT*  Clinical Data: PICC placement.  PORTABLE CHEST - 1 VIEW  Comparison: 07/27/2011.  Findings: Normal sized heart.  Stable post CABG changes.  Clear lungs.  Interval right PICC with its tip in the inferior aspect of the superior vena cava.  Mild lower thoracic spine degenerative changes.  IMPRESSION: Right PICC tip in the inferior aspect of the superior vena cava. If a position at the cavoatrial junction is desired, this could be advanced 2 cm.  Original Report Authenticated By: Darrol Angel, M.D.    INR: Will add last result for INR, ABG once components are confirmed Will add last 4 CBG results once components are confirmed  Assessment/Plan: S/P Procedure(s) (LRB): STERNAL WOUND DEBRIDEMENT (N/A)   1. UA +Ive , now with foley back in, will need to probably change antibiotics from ancef to  other agent. May be able to convert to po abx as well. Will need urology follow-up.   LOS: 8 days    GOLD,WAYNE E 8/1/20138:10 AM    Plan discharge in a.m. on IV Ancef 1 g IV 3 times a day at home. Home wound VAC change on a Monday Wednesday Friday schedule by home health nurse Patient be discharged with a Foley catheter indwelling and to be seen by her urologist next week.

## 2011-08-03 NOTE — Progress Notes (Signed)
Pt c/o of pain in bladder and that she felt as if it had locked up. Pt said that this happened Friday. Bladder scan revealed 452 ml. Pt refused in and out cath and wanted a foley to be placed. MD on call was notified and said to place foley. No other orders were given. Will continue to monitor.   Megann Easterwood M

## 2011-08-04 LAB — URINE CULTURE
Colony Count: NO GROWTH
Culture: NO GROWTH

## 2011-08-04 MED ORDER — GABAPENTIN 300 MG PO CAPS
600.0000 mg | ORAL_CAPSULE | Freq: Four times a day (QID) | ORAL | Status: DC
  Filled 2011-08-04 (×3): qty 2

## 2011-08-04 MED ORDER — HEPARIN SOD (PORK) LOCK FLUSH 100 UNIT/ML IV SOLN
250.0000 [IU] | INTRAVENOUS | Status: AC | PRN
  Administered 2011-08-04: 250 [IU]

## 2011-08-04 NOTE — Discharge Summary (Signed)
[address]            [place] [address]          [phone]      [name] Long 12/21/1940 71 y.o. [phone]  07/26/2011   [name] Perna, MD  sternal wound cellulitis abscess sternal wound abscess  HPI: 71 year old female s/p Cabg in January who presented yesterday to primary care physician with evidence of sternal wound infection. Sent to [facility] office where Dr [name] Long confirmed findings and she was admitted gor IV abx and plans made for sternal wound I+ D/debridement. In office the wound was opened and cultured. Obvious evidence of cellulites was also noted. Symptoms began about four days ago. She was admitted for further evaluation and treatment to include incision and drainage/debridement with probable placement of a VAC wound system.  Past Medical History   Diagnosis  Date   .  Allergy      RHINITIS   .  Asthma    .  Fibromyalgia    .  Fibromyalgia    .  Chronic low back pain    .  Osteoporosis      OSTEOPENIA   .  PPD positive    .  Connective tissue disease    .  Urinary incontinence    .  Hypertension    .  Crescendo angina, with EKG changes, new.  01/12/2011   .  Abnormal EKG  01/12/2011   .  Back pain, chronic--plans were for cervical disc surgery week of 01/16/11  01/12/2011   .  Asthma  01/12/2011    Past Surgical History   Procedure  Date   .  Appendectomy    .  Abdominal hysterectomy    .  Bladder repair    .  Cervical fusion    .  Breast surgery      REDUCTION   .  Carpal tunnel release    .  Coronary artery bypass graft  01/14/2011     Procedure: CORONARY ARTERY BYPASS GRAFTING (CABG); Surgeon: [name] Nations Suann Larry, MD; Location: [facility] OR; Service: Open Heart Surgery; Laterality: N/A; CABG x three, using right greater saphenous vein harvested endoscopically   Hip Bursae surgery  Family History   Problem  Relation  Age of Onset   .  Adopted: Yes    Social History: reports that she quit smoking about 31 years ago. She has  never used smokeless tobacco. She reports that she does not drink alcohol or use illicit drugs.  Allergies: No Known Allergies  Medications Prior to Admission   Medication  Sig  Dispense  Refill   .  albuterol (PROVENTIL HFA;VENTOLIN HFA) 108 (90 BASE) MCG/ACT inhaler  Inhale 1 puff into the lungs every 6 (six) hours as needed. For shortness of breath     .  aspirin EC 81 MG EC tablet  Take 1 tablet (81 mg total) by mouth daily.     .  Calcium Carbonate-Vitamin D (CALCIUM + D PO)  Take 1 tablet by mouth daily.     .  carisoprodol (SOMA) 350 MG tablet  Take 350 mg by mouth 4 (four) times daily as needed.     .  fentaNYL (DURAGESIC - DOSED MCG/HR) 50 MCG/HR  Place 1 patch onto the skin every 3 (three) days.     .  Fluticasone-Salmeterol (ADVAIR) 250-50 MCG/DOSE AEPB  Inhale 1 puff into the lungs every morning.     .  gabapentin (NEURONTIN) 600 MG tablet  Take 600 mg by mouth 4 (four) times daily.     .  hydroxychloroquine (PLAQUENIL) 200 MG tablet  Take 400 mg by mouth every evening.     .  metoprolol tartrate (LOPRESSOR) 25 MG tablet  Take 25 mg by mouth 2 (two) times daily.     .  montelukast (SINGULAIR) 10 MG tablet  TAKE 1 TABLET ONCE A DAY  90 tablet  3   .  Multiple Vitamin (MULITIVITAMIN WITH MINERALS) TABS  Take 1 tablet by mouth daily.     .  rosuvastatin (CRESTOR) 10 MG tablet  Take 1 tablet (10 mg total) by mouth daily.  30 tablet  1   Dilaudid 10 MG daily prn  Hospital Course:  The patient was admitted. She was taken to the operating room on 07/28/2011 and underwent the following procedure:  OPERATIVE REPORT  PROCEDURE: Superficial sternal debridement and placement of wound VAC  system.  PREOPERATIVE DIAGNOSIS: Superficial sternal wound infection.  POSTOPERATIVE DIAGNOSIS: Superficial sternal wound infection.  SURGEON: [name] Perna, MD  ASSISTANT: [name] Clack, PA-C  ANESTHESIA: General. She tolerated the procedure well and was taken to the postanesthesia care unit in stable  condition.  Postoperative hospital course:  The patient has progressed nicely in regards to the wound. Cultures revealed this to be a methicillin sensitive staph aureus. Initially she was on vancomycin and Fortaz but subsequently transitioned to intravenous Ancef. She initially had some difficulty with dressing changes do to severe pain. This has improved over time and is now tolerating with oral medications. She also has had difficulty with urinary retention, requiring replacement of the Foley catheter. This has been a difficulty in previous hospitalizations and she will be discharged with a Foley catheter with followup as an outpatient. She does have a urinary tract infection by UA and in addition to her intravenous Ancef has been started on a course of oral Cipro. She is otherwise felt to be doing well and is stable for discharge once home care arrangements are finalized for [facility] dressing changes and intravenous antibiotics.  No results found for this basename: NA:2,K:2,CL:2,CO2:2,GLUCOSE:2,BUN:2,CRATININE:2,CALCIUM:2 in the last 72 hours No results found for this basename: WBC:2,HGB:2,HCT:2,PLT:2 in the last 72 hours No results found for this basename: INR:2 in the last 72 hours   Discharge Instructions:  The patient is discharged to home with extensive instructions on wound care and progressive ambulation.  They are instructed not to drive or perform any heavy lifting until returning to see the physician in his office.  Discharge Diagnosis:  sternal wound cellulitis abscess sternal wound abscess  Secondary Diagnosis: Patient Active Problem List  Diagnosis  . Crescendo angina, with EKG changes, new.  . Back pain, chronic--plans were for cervical disc surgery week of 01/16/11  . Asthma  . Fibromyalgia  . S/P CABG x 3: (lima-lad,svg-om,svg-rca)  . CAD (coronary artery disease): multivessel, good LVF 50% at cath  . Urinary retention, foley placed  . Volume overload, post op  .  Hyperglycemia, post op, no prior history of DM  . Anemia, post op  . NSVT (nonsustained ventricular tachycardia), short runs 1/15   Past Medical History  Diagnosis Date  . Allergy     RHINITIS  . Asthma   . Fibromyalgia   . Fibromyalgia   . Chronic low back pain   . Osteoporosis     OSTEOPENIA  .  PPD positive   . Connective tissue disease   . Urinary incontinence   . Hypertension   . Crescendo angina, with EKG changes, new. 01/12/2011  . Abnormal EKG 01/12/2011  . Back pain, chronic--plans were for cervical disc surgery week of 01/16/11 01/12/2011  . Asthma 01/12/2011       Jannelly, Bergren  Home Medication Instructions [id number]   Printed on:08/04/11 8119  Medication Information                    gabapentin (NEURONTIN) 600 MG tablet Take 600 mg by mouth 4 (four) times daily.            fentaNYL (DURAGESIC - DOSED MCG/HR) 50 MCG/HR Place 1 patch onto the skin every 3 (three) days.             carisoprodol (SOMA) 350 MG tablet Take 350 mg by mouth 4 (four) times daily as needed.            Calcium Carbonate-Vitamin D (CALCIUM + D PO) Take 1 tablet by mouth daily.             Multiple Vitamin (MULITIVITAMIN WITH MINERALS) TABS Take 1 tablet by mouth daily.           albuterol (PROVENTIL HFA;VENTOLIN HFA) 108 (90 BASE) MCG/ACT inhaler Inhale 1 puff into the lungs every 6 (six) hours as needed. For shortness of breath           rosuvastatin (CRESTOR) 10 MG tablet Take 1 tablet (10 mg total) by mouth daily.           montelukast (SINGULAIR) 10 MG tablet TAKE 1 TABLET ONCE A DAY           Fluticasone-Salmeterol (ADVAIR) 250-50 MCG/DOSE AEPB Inhale 1 puff into the lungs every morning.           metoprolol tartrate (LOPRESSOR) 25 MG tablet Take 25 mg by mouth 2 (two) times daily.           hydroxychloroquine (PLAQUENIL) 200 MG tablet Take 400 mg by mouth every evening.           aspirin 325 MG tablet Take 1 tablet (325 mg total) by mouth daily.             dextrose 5 % SOLN 50 mL with cefTAZidime 1 G SOLR 1 g Inject 1 g into the vein every 12 (twelve) hours.           traMADol (ULTRAM) 50 MG tablet Take 1 tablet (50 mg total) by mouth every 6 (six) hours as needed for pain.           ceFAZolin (ANCEF) 2-3 GM-% SOLR Inject 25 mLs (1 g total) into the vein every 8 (eight) hours.           ciprofloxacin (CIPRO) 250 MG tablet Take 1 tablet (250 mg total) by mouth 2 (two) times daily.           oxyCODONE 10 MG TABS Take 1 tablet (10 mg total) by mouth every 4 (four) hours as needed for pain.             Disposition: For discharge home  Patient's condition is Good  Gershon Crane, PA-C 08/04/2011  7:22 AM

## 2011-08-04 NOTE — Consult Note (Signed)
WOC follow up  Wound type: surgical/sternum  Measurement: 6.5cm x 2.5cm x 1.0cm  Wound bed: pink, beefy red Drainage (amount, consistency, odor) serosanguinous in the canister Periwound:intact without problems Dressing procedure/placement/frequency: 1pc of Mepitel placed in the wound bed, 1pc black granufoam cut to fit into wound bed, and mushroom of black granufoam placed to accommodate the [facility] pad.  Pt tolerated removal and placement of the dressing with PO pain meds.  HHRN will need to call for pt to premedicate prior to dressing change and may want to have pt turn tx. Off. This may lessen pain with dressing changes. Note: hooked pt to home VAC unit, explained canister changes and need to keep unit plugged in until dc to home.  Supplies in the room for home [facility], reminded her to take these all home.  1pc of unused Mepitel with pt for use for first dressing change if needed.  Re consult if needed, will not follow at this time. Thanks  Laveda Demedeiros [employer], CWOCN [phone])

## 2011-08-04 NOTE — Progress Notes (Signed)
                   [address]            [place] [address]          [phone]     8 Days Post-Op  Procedure(s) (LRB): STERNAL WOUND DEBRIDEMENT (N/A) Subjective: Continues to feel well  Objective  Telemetry sinus rhythm  Temp:  [97.7 F (36.5 C)-98.8 F (37.1 C)] 97.9 F (36.6 C) (08/02 0555) Pulse Rate:  [62-71] 65  (08/02 0555) Resp:  [18-19] 19  (08/02 0555) BP: (129-153)/(77-80) 129/77 mmHg (08/02 0555) SpO2:  [94 %-95 %] 94 % (08/02 0555) Weight:  [162 lb 7.7 oz (73.7 kg)] 162 lb 7.7 oz (73.7 kg) (08/02 0555)   Intake/Output Summary (Last 24 hours) at 08/04/11 0735 Last data filed at 08/04/11 0600  Gross per 24 hour  Intake   2140 ml  Output   3926 ml  Net  -1786 ml       General appearance: alert, cooperative and no distress Wound: healing well  Lab Results: No results found for this basename: NA:2,K:2,CL:2,CO2:2,GLUCOSE:2,BUN:2,CREATININE:2,CALCIUM:2,MG:2,PHOS:2 in the last 72 hours No results found for this basename: AST:2,ALT:2,ALKPHOS:2,BILITOT:2,PROT:2,ALBUMIN:2 in the last 72 hours No results found for this basename: LIPASE:2,AMYLASE:2 in the last 72 hours No results found for this basename: WBC:2,NEUTROABS:2,HGB:2,HCT:2,MCV:2,PLT:2 in the last 72 hours No results found for this basename: CKTOTAL:4,CKMB:4,TROPONINI:4 in the last 72 hours No components found with this basename: POCBNP:3 No results found for this basename: DDIMER in the last 72 hours No results found for this basename: HGBA1C in the last 72 hours No results found for this basename: CHOL,HDL,LDLCALC,TRIG,CHOLHDL in the last 72 hours No results found for this basename: TSH,T4TOTAL,FREET3,T3FREE,THYROIDAB in the last 72 hours No results found for this basename: VITAMINB12,FOLATE,FERRITIN,TIBC,IRON,RETICCTPCT in the last 72 hours  Medications: Scheduled    . aspirin  325 mg Oral Daily  . atorvastatin  10 mg Oral q1800  . calcium-vitamin D  1 tablet Oral Daily  .   ceFAZolin (ANCEF) IV  2 g Intravenous Q8H  . cholecalciferol  2,000 Units Oral Daily  . ciprofloxacin  250 mg Oral BID  . fentaNYL  50 mcg Transdermal Q72H  . fluticasone-salmeterol  2 puff Inhalation BID  . gabapentin  600 mg Oral QID  . metoprolol tartrate  25 mg Oral BID  . montelukast  10 mg Oral QHS  . multivitamin with minerals  1 tablet Oral Daily  . polyethylene glycol  17 g Oral Once     Radiology/Studies:  No results found.  INR: Will add last result for INR, ABG once components are confirmed Will add last 4 CBG results once components are confirmed  Assessment/Plan: S/P Procedure(s) (LRB): STERNAL WOUND DEBRIDEMENT (N/A)  1 for d/c today with iv ancef, po cipro, follow-up with urology next week. OV next week for wound check    LOS: 9 days    Consandra Laske E 8/2/20137:35 AM

## 2011-08-09 ENCOUNTER — Encounter: Payer: Self-pay | Admitting: Cardiothoracic Surgery

## 2011-08-09 ENCOUNTER — Ambulatory Visit (INDEPENDENT_AMBULATORY_CARE_PROVIDER_SITE_OTHER): Payer: Self-pay | Admitting: Cardiothoracic Surgery

## 2011-08-09 VITALS — BP 130/73 | HR 73 | Resp 16 | Ht 65.0 in | Wt 160.0 lb

## 2011-08-09 DIAGNOSIS — IMO0002 Reserved for concepts with insufficient information to code with codable children: Secondary | ICD-10-CM

## 2011-08-09 DIAGNOSIS — Z09 Encounter for follow-up examination after completed treatment for conditions other than malignant neoplasm: Secondary | ICD-10-CM

## 2011-08-09 DIAGNOSIS — T07XXXA Unspecified multiple injuries, initial encounter: Secondary | ICD-10-CM

## 2011-08-09 NOTE — Progress Notes (Signed)
PCP is Carollee Herter, MD Referring Provider is Tysinger, Kermit Balo, PA  Chief Complaint  Patient presents with  . Routine Post Op    STERNAL WOUND DEBRIDEMENT 07/27/11 AND VAC PLACEMENT    HPI: Wound check following debridement of superficial sternal wound infection 7 months following surgery The patient being treated by home health nurse wound VAC changes and IV Ancef for MSSA soft tissue infection Surrounding cellulitis almost resolved wound clean 100% granulation tissue  Past Medical History  Diagnosis Date  . Allergy     RHINITIS  . Asthma   . Fibromyalgia   . Fibromyalgia   . Chronic low back pain   . Osteoporosis     OSTEOPENIA  . PPD positive   . Connective tissue disease   . Urinary incontinence   . Hypertension   . Crescendo angina, with EKG changes, new. 01/12/2011  . Abnormal EKG 01/12/2011  . Back pain, chronic--plans were for cervical disc surgery week of 01/16/11 01/12/2011  . Asthma 01/12/2011    Past Surgical History  Procedure Date  . Appendectomy   . Abdominal hysterectomy   . Bladder repair   . Cervical fusion   . Breast surgery     REDUCTION  . Carpal tunnel release   . Coronary artery bypass graft 01/14/2011    Procedure: CORONARY ARTERY BYPASS GRAFTING (CABG);  Surgeon: Kathlee Nations Suann Larry, MD;  Location: [facility] OR;  Service: Open Heart Surgery;  Laterality: N/A;  CABG x three, using right greater saphenous vein harvested endoscopically  . Sternal wound debridement 07/27/2011    Procedure: STERNAL WOUND DEBRIDEMENT;  Surgeon: [name], MD;  Location: [facility] OR;  Service: Open Heart Surgery;  Laterality: N/A;    Family History  Problem Relation Age of Onset  . Adopted: Yes    Social History History  Substance Use Topics  . Smoking status: Former Smoker    Quit date: 01/12/1980  . Smokeless tobacco: Never Used  . Alcohol Use: No    Current Outpatient Prescriptions  Medication Sig Dispense Refill  . albuterol (PROVENTIL HFA;VENTOLIN HFA)  108 (90 BASE) MCG/ACT inhaler Inhale 1 puff into the lungs every 6 (six) hours as needed. For shortness of breath      . aspirin 325 MG tablet Take 1 tablet (325 mg total) by mouth daily.      . Calcium Carbonate-Vitamin D (CALCIUM + D PO) Take 1 tablet by mouth daily.        . carisoprodol (SOMA) 350 MG tablet Take 350 mg by mouth 4 (four) times daily as needed.       Marland Kitchen ceFAZolin (ANCEF) 2-3 GM-% SOLR Inject 25 mLs (1 g total) into the vein every 8 (eight) hours.  30 each  0  . ciprofloxacin (CIPRO) 250 MG tablet Take 1 tablet (250 mg total) by mouth 2 (two) times daily.  12 tablet  0  . dextrose 5 % SOLN 50 mL with cefTAZidime 1 G SOLR 1 g Inject 1 g into the vein every 12 (twelve) hours.  1 Bottle  2  . fentaNYL (DURAGESIC - DOSED MCG/HR) 50 MCG/HR Place 1 patch onto the skin every 3 (three) days.        . Fluticasone-Salmeterol (ADVAIR) 250-50 MCG/DOSE AEPB Inhale 1 puff into the lungs every morning.      . gabapentin (NEURONTIN) 600 MG tablet Take 600 mg by mouth 4 (four) times daily.       . hydroxychloroquine (PLAQUENIL) 200 MG tablet Take 400  mg by mouth every evening.      . metoprolol tartrate (LOPRESSOR) 25 MG tablet Take 25 mg by mouth 2 (two) times daily.      . montelukast (SINGULAIR) 10 MG tablet TAKE 1 TABLET ONCE A DAY  90 tablet  3  . Multiple Vitamin (MULITIVITAMIN WITH MINERALS) TABS Take 1 tablet by mouth daily.      Marland Kitchen oxyCODONE 10 MG TABS Take 1 tablet (10 mg total) by mouth every 4 (four) hours as needed for pain.  50 tablet  0  . rosuvastatin (CRESTOR) 10 MG tablet Take 1 tablet (10 mg total) by mouth daily.  30 tablet  1    No Known Allergies  Review of Systems no fever good appetite improved energy  BP 130/73  Pulse 73  Resp 16  Ht 5\' 5"  (1.651 m)  Wt 160 lb (72.576 kg)  BMI 26.63 kg/m2  SpO2 93% Physical Exam Lungs clear heart rate regular Wound with 100% clean granulation tissue no drainage no deep sinus tract  Diagnostic  Tests: None  Impression: Continue current wound care  Plan: Return for office visit on August 19

## 2011-08-09 NOTE — Patient Instructions (Signed)
Continue IV antibiotics as ordered Continue wound VAC changed Monday Wednesday Friday We will change the wound dressing in the office

## 2011-08-11 ENCOUNTER — Encounter: Payer: Self-pay | Admitting: Cardiothoracic Surgery

## 2011-08-12 NOTE — Discharge Summary (Signed)
patient examined and medical record reviewed,agree with above note. VAN TRIGT III,Airam Heidecker 08/12/2011    

## 2011-08-12 NOTE — Discharge Summary (Signed)
patient examined and medical record reviewed,agree with above note. VAN TRIGT III,Verdella Laidlaw 08/12/2011    

## 2011-08-13 ENCOUNTER — Emergency Department (HOSPITAL_COMMUNITY)
Admission: EM | Admit: 2011-08-13 | Discharge: 2011-08-13 | Disposition: A | Discharge status: Home / Self Care | Payer: Medicare Other | Attending: Emergency Medicine | Admitting: Emergency Medicine

## 2011-08-13 ENCOUNTER — Encounter (HOSPITAL_COMMUNITY): Payer: Self-pay | Admitting: *Deleted

## 2011-08-13 DIAGNOSIS — Z8614 Personal history of Methicillin resistant Staphylococcus aureus infection: Secondary | ICD-10-CM | POA: Insufficient documentation

## 2011-08-13 DIAGNOSIS — I10 Essential (primary) hypertension: Secondary | ICD-10-CM | POA: Insufficient documentation

## 2011-08-13 DIAGNOSIS — Z79899 Other long term (current) drug therapy: Secondary | ICD-10-CM | POA: Insufficient documentation

## 2011-08-13 DIAGNOSIS — M810 Age-related osteoporosis without current pathological fracture: Secondary | ICD-10-CM | POA: Insufficient documentation

## 2011-08-13 DIAGNOSIS — G8929 Other chronic pain: Secondary | ICD-10-CM | POA: Insufficient documentation

## 2011-08-13 DIAGNOSIS — R339 Retention of urine, unspecified: Secondary | ICD-10-CM | POA: Insufficient documentation

## 2011-08-13 DIAGNOSIS — Z87891 Personal history of nicotine dependence: Secondary | ICD-10-CM | POA: Insufficient documentation

## 2011-08-13 DIAGNOSIS — Y849 Medical procedure, unspecified as the cause of abnormal reaction of the patient, or of later complication, without mention of misadventure at the time of the procedure: Secondary | ICD-10-CM | POA: Insufficient documentation

## 2011-08-13 DIAGNOSIS — IMO0002 Reserved for concepts with insufficient information to code with codable children: Secondary | ICD-10-CM | POA: Insufficient documentation

## 2011-08-13 DIAGNOSIS — Z951 Presence of aortocoronary bypass graft: Secondary | ICD-10-CM | POA: Insufficient documentation

## 2011-08-13 DIAGNOSIS — N9989 Other postprocedural complications and disorders of genitourinary system: Secondary | ICD-10-CM

## 2011-08-13 DIAGNOSIS — Z7982 Long term (current) use of aspirin: Secondary | ICD-10-CM | POA: Insufficient documentation

## 2011-08-13 LAB — URINALYSIS, ROUTINE W REFLEX MICROSCOPIC
Bilirubin Urine: NEGATIVE
Ketones, ur: NEGATIVE mg/dL
Nitrite: NEGATIVE
Urobilinogen, UA: 0.2 mg/dL (ref 0.0–1.0)

## 2011-08-13 LAB — URINE MICROSCOPIC-ADD ON

## 2011-08-13 NOTE — ED Notes (Signed)
Pt had wound vac placed 2 weeks ago in chest and had a foley for one week and then had to be reinserted.  Pt was discharged on Friday and foley was discontinued on Sunday at about 4am started having severe bladder area pain and not able to urinate.  Pt reports terrible pressure

## 2011-08-13 NOTE — ED Provider Notes (Signed)
I saw and evaluated the patient, reviewed the resident's note and I agree with the findings and plan. Patient with recent surgery who had a Foley placed for 7 days which was DC'd 48 hours ago and patient now having retention symptoms. She states she just finished a course of Cipro for a UTI and was having no dysuria urgency or frequency until 4 AM this morning when she developed pressure and was unable to urinate. This has happened to her multiple times. On exam she was anxious and very uncomfortable. Ultrasound placed and patient had a large amount of urine in the bladder. Foley placed and patient had over 800 cc out. Urine was cultured and patient was discharged home   Gwyneth Sprout, MD 08/13/11 [phone]

## 2011-08-13 NOTE — ED Notes (Addendum)
Pt reports having a cardiac stent placed 27th, indwelling foley cath placed 2 weeks ago along w/a wound vac, foley removed after 1 week, pt unable to void 1 week post cath removal and had another foley placed which was later removed by her urologist. Pt reports waking at 0400 this am unable to void. Pt reports increase abd pain and distention. No other symptoms noted.

## 2011-08-13 NOTE — ED Provider Notes (Signed)
History     CSN: [id number]  Arrival date & time 08/13/11  0644   First MD Initiated Contact with Patient 08/13/11 0708     CC:  Unable to void  HPI:  Patient presents with < 24 hours of inability to void.  She had open heart surgery in January and needed a foley catheter for 1 week post op.  She had a chest wound debridement on July 25 and had a foley in place for about 4 days post-op for inability to void.  This was removed on July 29 and she was voiding normally with no symptoms for about 4 days after this.  She then stopped voiding again so a catheter was re-inserted on August 2 and this foley remained in place until an outpaitent follow-up visit on August 9th.  They removed the foley at this visit and she was again able to void normally with no symptoms until now. Then overnight, she suddenly was unable to void and began feeling pressure and pain in her lower abdomen.  She is on Ancef 1g q8 hours post-operative for a MSSA infection of the anterior chest wall and she just completed a 10 day course of Cipro for a suspected UTI following catheter re-insertion on August 2nd.  She denies dysuria, increased frequency, and hematuria preceding this inability void.   Past Medical History  Diagnosis Date  . Allergy     RHINITIS  . Asthma   . Fibromyalgia   . Fibromyalgia   . Chronic low back pain   . Osteoporosis     OSTEOPENIA  . PPD positive   . Connective tissue disease   . Urinary incontinence   . Hypertension   . Crescendo angina, with EKG changes, new. 01/12/2011  . Abnormal EKG 01/12/2011  . Back pain, chronic--plans were for cervical disc surgery week of 01/16/11 01/12/2011  . Asthma 01/12/2011    Past Surgical History  Procedure Date  . Appendectomy   . Abdominal hysterectomy   . Bladder repair   . Cervical fusion   . Breast surgery     REDUCTION  . Carpal tunnel release   . Coronary artery bypass graft 01/14/2011    Procedure: CORONARY ARTERY BYPASS GRAFTING (CABG);   Surgeon: Kathlee Nations Suann Larry, MD;  Location: [facility] OR;  Service: Open Heart Surgery;  Laterality: N/A;  CABG x three, using right greater saphenous vein harvested endoscopically  . Sternal wound debridement 07/27/2011    Procedure: STERNAL WOUND DEBRIDEMENT;  Surgeon: [name], MD;  Location: [facility] OR;  Service: Open Heart Surgery;  Laterality: N/A;    Family History  Problem Relation Age of Onset  . Adopted: Yes    History  Substance Use Topics  . Smoking status: Former Smoker    Quit date: 01/12/1980  . Smokeless tobacco: Never Used  . Alcohol Use: No    OB History    Grav Para Term Preterm Abortions TAB SAB Ect Mult Living                  Review of Systems  All other systems reviewed and are negative.    Allergies  Review of patient's allergies indicates no known allergies.  Home Medications   Current Outpatient Rx  Name Route Sig Dispense Refill  . ALBUTEROL SULFATE HFA 108 (90 BASE) MCG/ACT IN AERS Inhalation Inhale 1 puff into the lungs every 6 (six) hours as needed. For shortness of breath    . ASPIRIN 325 MG PO TABS  Oral Take 1 tablet (325 mg total) by mouth daily.    Marland Kitchen CALCIUM + D PO Oral Take 1 tablet by mouth daily.      Marland Kitchen CARISOPRODOL 350 MG PO TABS Oral Take 350 mg by mouth 4 (four) times daily as needed.     Marland Kitchen CEFAZOLIN SODIUM-DEXTROSE 2-3 GM-% IV SOLR Intravenous Inject 25 mLs (1 g total) into the vein every 8 (eight) hours. 30 each 0  . CIPROFLOXACIN HCL 250 MG PO TABS Oral Take 1 tablet (250 mg total) by mouth 2 (two) times daily. 12 tablet 0  . CEFTAZIDIME 1 G/50 ML IVPB MIXTURE Intravenous Inject 1 g into the vein every 12 (twelve) hours. 1 Bottle 2  . FENTANYL 50 MCG/HR TD PT72 Transdermal Place 1 patch onto the skin every 3 (three) days.      Marland Kitchen FLUTICASONE-SALMETEROL 250-50 MCG/DOSE IN AEPB Inhalation Inhale 1 puff into the lungs every morning.    Marland Kitchen GABAPENTIN 600 MG PO TABS Oral Take 600 mg by mouth 4 (four) times daily.     Marland Kitchen HYDROXYCHLOROQUINE  SULFATE 200 MG PO TABS Oral Take 400 mg by mouth every evening.    Marland Kitchen METOPROLOL TARTRATE 25 MG PO TABS Oral Take 25 mg by mouth 2 (two) times daily.    Marland Kitchen MONTELUKAST SODIUM 10 MG PO TABS  TAKE 1 TABLET ONCE A DAY 90 tablet 3  . ADULT MULTIVITAMIN W/MINERALS CH Oral Take 1 tablet by mouth daily.    . OXYCODONE HCL 10 MG PO TABS Oral Take 1 tablet (10 mg total) by mouth every 4 (four) hours as needed for pain. 50 tablet 0  . ROSUVASTATIN CALCIUM 10 MG PO TABS Oral Take 1 tablet (10 mg total) by mouth daily. 30 tablet 1    BP 189/98  Pulse 101  Temp 99.4 F (37.4 C) (Oral)  Resp 22  SpO2 96%  Physical Exam  Constitutional: She appears well-developed and well-nourished. She does not appear ill. She appears distressed.  Cardiovascular: Normal rate and regular rhythm.   Pulmonary/Chest: Breath sounds normal.       Wound vac in place over the mid, anterior chest.  No erythema or other apparent signs of infection.  Abdominal: She exhibits distension (lower abdomen). She exhibits no mass. There is tenderness in the suprapubic area.    ED Course  Procedures (including critical care time)  7:35 AM - Ultrasound confirms distended bladder.  Foley catheter placed.  U/A and UCx ordered.  7:43 AM - Pt reassessed after catheterization.  Now pain free.  Abdomen is soft and non-tender. > 800cc drained.   Labs Reviewed  URINALYSIS, ROUTINE W REFLEX MICROSCOPIC  URINE CULTURE  URINALYSIS, WITH MICROSCOPIC    1. Postoperative voiding difficulty      MDM  Patient has a history of postoperative voiding dysfunction and it seems she is suffering from this again.  She finished a course of ciprofloxacin for a suspected UTI today and she has had no urinary symptoms preceding this dysfunction, so a UTI is unlikely.  Damage to cystic nerves is also unlikely since she has intermittently been able to void normally and her recent surgery was chest wall debridement.  We will send her home with a foley  catheter in place with instructions to call her surgeon, Dr. Donata Clay, tomorrow morning.  She has an appointment with him a week from tomorrow.  Lollie Sails, MD 08/13/11 [phone]

## 2011-08-14 ENCOUNTER — Encounter: Payer: Self-pay | Admitting: Cardiothoracic Surgery

## 2011-08-14 LAB — URINE CULTURE

## 2011-08-15 ENCOUNTER — Encounter (INDEPENDENT_AMBULATORY_CARE_PROVIDER_SITE_OTHER): Payer: Self-pay | Admitting: Ophthalmology

## 2011-08-21 ENCOUNTER — Ambulatory Visit: Payer: Self-pay | Admitting: Cardiothoracic Surgery

## 2011-08-21 ENCOUNTER — Encounter: Payer: Self-pay | Admitting: Cardiothoracic Surgery

## 2011-08-21 ENCOUNTER — Ambulatory Visit (INDEPENDENT_AMBULATORY_CARE_PROVIDER_SITE_OTHER): Payer: Self-pay | Admitting: Cardiothoracic Surgery

## 2011-08-21 VITALS — BP 134/84 | HR 70 | Temp 98.3°F | Resp 18 | Ht 65.0 in | Wt 160.0 lb

## 2011-08-21 DIAGNOSIS — Z09 Encounter for follow-up examination after completed treatment for conditions other than malignant neoplasm: Secondary | ICD-10-CM

## 2011-08-21 DIAGNOSIS — T07XXXA Unspecified multiple injuries, initial encounter: Secondary | ICD-10-CM

## 2011-08-21 DIAGNOSIS — Z951 Presence of aortocoronary bypass graft: Secondary | ICD-10-CM

## 2011-08-21 NOTE — Progress Notes (Signed)
PCP is Carollee Herter, MD Referring Provider is Ronnald Nian, MD  Chief Complaint  Patient presents with  . Routine Post Op    2 week f/u wound check, S/P sternal wound debridement and vac placement on 07/27/11      HPI: Superficial sternal wound infection 6 months after CABG from probable penetration-contamination skin pathogen related to poison ivy on the chest wall We'll now granulating and normal is completely wound VAC out wet-to-dry dressings daily by patient continued IV Ancef for MSSA culture Past Medical History  Diagnosis Date  . Allergy     RHINITIS  . Asthma   . Fibromyalgia   . Fibromyalgia   . Chronic low back pain   . Osteoporosis     OSTEOPENIA  . PPD positive   . Connective tissue disease   . Urinary incontinence   . Hypertension   . Crescendo angina, with EKG changes, new. 01/12/2011  . Abnormal EKG 01/12/2011  . Back pain, chronic--plans were for cervical disc surgery week of 01/16/11 01/12/2011  . Asthma 01/12/2011    Past Surgical History  Procedure Date  . Appendectomy   . Abdominal hysterectomy   . Bladder repair   . Cervical fusion   . Breast surgery     REDUCTION  . Carpal tunnel release   . Coronary artery bypass graft 01/14/2011    Procedure: CORONARY ARTERY BYPASS GRAFTING (CABG);  Surgeon: Kathlee Nations Suann Larry, MD;  Location: [facility] OR;  Service: Open Heart Surgery;  Laterality: N/A;  CABG x three, using right greater saphenous vein harvested endoscopically  . Sternal wound debridement 07/27/2011    Procedure: STERNAL WOUND DEBRIDEMENT;  Surgeon: [name], MD;  Location: [facility] OR;  Service: Open Heart Surgery;  Laterality: N/A;    Family History  Problem Relation Age of Onset  . Adopted: Yes    Social History History  Substance Use Topics  . Smoking status: Former Smoker    Quit date: 01/12/1980  . Smokeless tobacco: Never Used  . Alcohol Use: No    Current Outpatient Prescriptions  Medication Sig Dispense Refill  . albuterol  (PROVENTIL HFA;VENTOLIN HFA) 108 (90 BASE) MCG/ACT inhaler Inhale 1 puff into the lungs every 6 (six) hours as needed. For shortness of breath      . aspirin 325 MG EC tablet Take 325 mg by mouth daily.      . Calcium Carbonate-Vit D-Min (CALCIUM 1200 PO) Take 1 tablet by mouth daily.      . carisoprodol (SOMA) 350 MG tablet Take 350 mg by mouth daily as needed.      Marland Kitchen ceFAZolin (ANCEF) 2-3 GM-% SOLR Inject 25 mLs (1 g total) into the vein every 8 (eight) hours.  30 each  0  . fentaNYL (DURAGESIC - DOSED MCG/HR) 50 MCG/HR Place 1 patch onto the skin every 3 (three) days.        . Fluticasone-Salmeterol (ADVAIR) 250-50 MCG/DOSE AEPB Inhale 1 puff into the lungs every 12 (twelve) hours.      . gabapentin (NEURONTIN) 600 MG tablet Take 600 mg by mouth 4 (four) times daily.       Marland Kitchen HYDROmorphone (DILAUDID) 4 MG tablet Take 4 mg by mouth daily as needed. For pain      . hydroxychloroquine (PLAQUENIL) 200 MG tablet Take 400 mg by mouth every evening.      . metoprolol tartrate (LOPRESSOR) 25 MG tablet Take 25 mg by mouth 2 (two) times daily.      Marland Kitchen  montelukast (SINGULAIR) 10 MG tablet Take 10 mg by mouth at bedtime.      . Multiple Vitamin (MULITIVITAMIN WITH MINERALS) TABS Take 1 tablet by mouth daily.      Marland Kitchen omega-3 acid ethyl esters (LOVAZA) 1 G capsule Take 1 g by mouth daily.      . rosuvastatin (CRESTOR) 10 MG tablet Take 10 mg by mouth daily.        No Known Allergies  Review of Systems no fever  BP 134/84  Pulse 70  Temp 98.3 F (36.8 C) (Oral)  Resp 18  Ht 5\' 5"  (1.651 m)  Wt 160 lb (72.576 kg)  BMI 26.63 kg/m2  SpO2 96% Physical Exam Wound almost completely healed very superficial We'll start shower soap and water and Neosporin dressing stop wound pack stop IV antibiotic PICC line removed from right arm and dressing applied   Diagnostic Tests: None  Impression: Healed superficial sternal infection after VAC and IV antibiotics Continue with skin care and one more week of  oral Keflex  Plan: Return for wound check in 2 weeks stop home health care

## 2011-09-06 ENCOUNTER — Ambulatory Visit (INDEPENDENT_AMBULATORY_CARE_PROVIDER_SITE_OTHER): Payer: Medicare Other | Admitting: Cardiothoracic Surgery

## 2011-09-06 ENCOUNTER — Encounter: Payer: Self-pay | Admitting: Cardiothoracic Surgery

## 2011-09-06 VITALS — BP 141/89 | HR 60 | Resp 20 | Ht 65.0 in | Wt 160.0 lb

## 2011-09-06 DIAGNOSIS — Z951 Presence of aortocoronary bypass graft: Secondary | ICD-10-CM

## 2011-09-06 DIAGNOSIS — T07XXXA Unspecified multiple injuries, initial encounter: Secondary | ICD-10-CM

## 2011-09-06 DIAGNOSIS — Z09 Encounter for follow-up examination after completed treatment for conditions other than malignant neoplasm: Secondary | ICD-10-CM

## 2011-09-06 NOTE — Progress Notes (Signed)
PCP is Carollee Herter, MD Referring Provider is Ronnald Nian, MD  Chief Complaint  Patient presents with  . Routine Post Op    2 week f/u wound check, S/P sternal wound debridement on 07/27/11     HPI: Final office visit for wound check a delayed official sternal infection 8 months post CABG. The patient underwent operative debridement wound VAC therapy and a course of antibiotics and is now completely healed. Only routine skin care as needed. No lifting restrictions or other activity restrictions.   Past Medical History  Diagnosis Date  . Allergy     RHINITIS  . Asthma   . Fibromyalgia   . Fibromyalgia   . Chronic low back pain   . Osteoporosis     OSTEOPENIA  . PPD positive   . Connective tissue disease   . Urinary incontinence   . Hypertension   . Crescendo angina, with EKG changes, new. 01/12/2011  . Abnormal EKG 01/12/2011  . Back pain, chronic--plans were for cervical disc surgery week of 01/16/11 01/12/2011  . Asthma 01/12/2011    Past Surgical History  Procedure Date  . Appendectomy   . Abdominal hysterectomy   . Bladder repair   . Cervical fusion   . Breast surgery     REDUCTION  . Carpal tunnel release   . Coronary artery bypass graft 01/14/2011    Procedure: CORONARY ARTERY BYPASS GRAFTING (CABG);  Surgeon: Kathlee Nations Suann Larry, MD;  Location: [facility] OR;  Service: Open Heart Surgery;  Laterality: N/A;  CABG x three, using right greater saphenous vein harvested endoscopically  . Sternal wound debridement 07/27/2011    Procedure: STERNAL WOUND DEBRIDEMENT;  Surgeon: [name], MD;  Location: [facility] OR;  Service: Open Heart Surgery;  Laterality: N/A;    Family History  Problem Relation Age of Onset  . Adopted: Yes    Social History History  Substance Use Topics  . Smoking status: Former Smoker    Quit date: 01/12/1980  . Smokeless tobacco: Never Used  . Alcohol Use: No    Current Outpatient Prescriptions  Medication Sig Dispense Refill  . albuterol  (PROVENTIL HFA;VENTOLIN HFA) 108 (90 BASE) MCG/ACT inhaler Inhale 1 puff into the lungs every 6 (six) hours as needed. For shortness of breath      . aspirin 325 MG EC tablet Take 325 mg by mouth daily.      . Calcium Carbonate-Vit D-Min (CALCIUM 1200 PO) Take 1 tablet by mouth daily.      . carisoprodol (SOMA) 350 MG tablet Take 350 mg by mouth daily as needed.      . fentaNYL (DURAGESIC - DOSED MCG/HR) 50 MCG/HR Place 1 patch onto the skin every 3 (three) days.        . Fluticasone-Salmeterol (ADVAIR) 250-50 MCG/DOSE AEPB Inhale 1 puff into the lungs every 12 (twelve) hours.      . gabapentin (NEURONTIN) 600 MG tablet Take 600 mg by mouth 4 (four) times daily.       Marland Kitchen HYDROmorphone (DILAUDID) 4 MG tablet Take 4 mg by mouth daily as needed. For pain      . hydroxychloroquine (PLAQUENIL) 200 MG tablet Take 400 mg by mouth every evening.      . metoprolol tartrate (LOPRESSOR) 25 MG tablet Take 25 mg by mouth 2 (two) times daily.      . montelukast (SINGULAIR) 10 MG tablet Take 10 mg by mouth at bedtime.      . Multiple Vitamin (MULITIVITAMIN WITH  MINERALS) TABS Take 1 tablet by mouth daily.      Marland Kitchen omega-3 acid ethyl esters (LOVAZA) 1 G capsule Take 1 g by mouth daily.      . rosuvastatin (CRESTOR) 10 MG tablet Take 10 mg by mouth daily.        No Known Allergies  Review of Systems no fever no angina  BP 141/89  Pulse 60  Resp 20  Ht 5\' 5"  (1.651 m)  Wt 160 lb (72.576 kg)  BMI 26.63 kg/m2  SpO2 96% Physical Exam Heart rate regular Breath sounds clear Sternal incision well-healed Diagnostic Tests:  None Impression: Healed and resolved superficial sternal wound infection  Plan: Return as needed

## 2011-09-13 ENCOUNTER — Encounter (INDEPENDENT_AMBULATORY_CARE_PROVIDER_SITE_OTHER): Payer: Medicare Other | Admitting: Ophthalmology

## 2011-09-13 DIAGNOSIS — Z09 Encounter for follow-up examination after completed treatment for conditions other than malignant neoplasm: Secondary | ICD-10-CM

## 2011-09-13 DIAGNOSIS — H43819 Vitreous degeneration, unspecified eye: Secondary | ICD-10-CM

## 2011-09-13 DIAGNOSIS — H2510 Age-related nuclear cataract, unspecified eye: Secondary | ICD-10-CM

## 2011-11-20 ENCOUNTER — Encounter: Payer: Self-pay | Admitting: Family Medicine

## 2012-01-03 HISTORY — PX: TRIGGER FINGER RELEASE: SHX641

## 2012-02-19 ENCOUNTER — Other Ambulatory Visit: Payer: Self-pay | Admitting: Family Medicine

## 2012-04-08 ENCOUNTER — Ambulatory Visit (INDEPENDENT_AMBULATORY_CARE_PROVIDER_SITE_OTHER): Payer: Medicare Other | Admitting: Family Medicine

## 2012-04-08 ENCOUNTER — Encounter: Payer: Self-pay | Admitting: Family Medicine

## 2012-04-08 VITALS — BP 116/80 | HR 68 | Wt 169.0 lb

## 2012-04-08 DIAGNOSIS — L259 Unspecified contact dermatitis, unspecified cause: Secondary | ICD-10-CM

## 2012-04-08 DIAGNOSIS — H8110 Benign paroxysmal vertigo, unspecified ear: Secondary | ICD-10-CM

## 2012-04-08 MED ORDER — TRIAMCINOLONE ACETONIDE 0.5 % EX CREA: TOPICAL_CREAM | Freq: Two times a day (BID) | CUTANEOUS | Status: DC

## 2012-04-08 NOTE — Patient Instructions (Addendum)
Use the medicine sparingly twice per day for the next 2-3 weeks and if no improvement, call me and I will refer to dermatology

## 2012-04-08 NOTE — Progress Notes (Signed)
  Subjective:    Patient ID: [name] Long, female    DOB: [birth year], 72 y.o.   MRN: [id number]  HPI She is here for evaluation of a rash present on her lower legs that occurred after she had a pedicure. She has tried OTC meds without success. The rash is only on the shins  She also complains of a one-month history of dizziness especially when she would lie down and turn her head to the left. No nausea no vomiting no blurred or double vomiting, blurred or double vision, weakness or numbness. Medications were reviewed.   Review of Systems     Objective:   Physical Exam alert and in no distress. EOMI. Other cranial nerves grossly intact.Tympanic membranes and canals are normal. Throat is clear. Tonsils are normal. Neck is supple without adenopathy or thyromegaly. Cardiac exam shows a regular sinus rhythm without murmurs or gallops. Lungs are clear to auscultation. No carotid bruits noted. Exam of her bilateral shins shows erythematous raised lesions with blanching surrounding the lesions. They're irregular in shape but do not appear like erythema multiforme.        Assessment & Plan:  Contact dermatitis - Plan: triamcinolone cream (KENALOG) 0.5 %  Benign paroxysmal positional vertigo - Plan: Ambulatory referral to Physical Therapy, CANCELED: Ambulatory referral to Physical Therapy Use the medicine sparingly twice per day for the next 2-3 weeks and if no improvement, call me and I will refer to dermatology She will be referred to physical therapy for Epley maneuvers

## 2012-04-16 ENCOUNTER — Ambulatory Visit: Payer: Medicare Other | Attending: Family Medicine | Admitting: Rehabilitative and Restorative Service Providers"

## 2012-04-16 DIAGNOSIS — R269 Unspecified abnormalities of gait and mobility: Secondary | ICD-10-CM | POA: Insufficient documentation

## 2012-04-16 DIAGNOSIS — IMO0001 Reserved for inherently not codable concepts without codable children: Secondary | ICD-10-CM | POA: Insufficient documentation

## 2012-04-16 DIAGNOSIS — H8110 Benign paroxysmal vertigo, unspecified ear: Secondary | ICD-10-CM | POA: Insufficient documentation

## 2012-04-26 ENCOUNTER — Ambulatory Visit: Payer: Medicare Other | Admitting: Rehabilitative and Restorative Service Providers"

## 2012-05-10 ENCOUNTER — Ambulatory Visit: Payer: Medicare Other | Attending: Family Medicine | Admitting: Rehabilitative and Restorative Service Providers"

## 2012-05-10 DIAGNOSIS — IMO0001 Reserved for inherently not codable concepts without codable children: Secondary | ICD-10-CM | POA: Insufficient documentation

## 2012-05-10 DIAGNOSIS — H8110 Benign paroxysmal vertigo, unspecified ear: Secondary | ICD-10-CM | POA: Insufficient documentation

## 2012-05-10 DIAGNOSIS — R269 Unspecified abnormalities of gait and mobility: Secondary | ICD-10-CM | POA: Insufficient documentation

## 2012-05-24 ENCOUNTER — Ambulatory Visit: Payer: Medicare Other | Admitting: Rehabilitative and Restorative Service Providers"

## 2012-05-28 ENCOUNTER — Other Ambulatory Visit: Payer: Self-pay

## 2012-05-28 ENCOUNTER — Telehealth: Payer: Self-pay | Admitting: Internal Medicine

## 2012-05-28 DIAGNOSIS — R7611 Nonspecific reaction to tuberculin skin test without active tuberculosis: Secondary | ICD-10-CM

## 2012-05-28 NOTE — Telephone Encounter (Signed)
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 She can not get TB test done cause it will come back positive so that's why she xray done

## 2012-05-28 NOTE — Telephone Encounter (Signed)
CALLED PT TO INFORM HER OF ORDER IN SYSTEM FOR CHEST X-RAY

## 2012-05-28 NOTE — Telephone Encounter (Signed)
Order a routine chest x-ray for followup from previous +PPD

## 2012-05-29 ENCOUNTER — Ambulatory Visit
Admission: RE | Admit: 2012-05-29 | Discharge: 2012-05-29 | Disposition: A | Discharge status: Home / Self Care | Payer: Medicare Other | Source: Ambulatory Visit | Attending: Family Medicine | Admitting: Family Medicine

## 2012-05-29 DIAGNOSIS — R7611 Nonspecific reaction to tuberculin skin test without active tuberculosis: Secondary | ICD-10-CM

## 2012-05-29 NOTE — Progress Notes (Signed)
Quick Note:  PT INFORMED AND SHE NEEDS A LETTER SO SHE CAN TAKE TO HOSPICE ______

## 2012-05-30 ENCOUNTER — Encounter: Payer: Self-pay | Admitting: Family Medicine

## 2012-08-21 ENCOUNTER — Ambulatory Visit (INDEPENDENT_AMBULATORY_CARE_PROVIDER_SITE_OTHER): Payer: Medicare Other | Admitting: Cardiovascular Disease

## 2012-08-21 ENCOUNTER — Encounter: Payer: Self-pay | Admitting: Cardiovascular Disease

## 2012-08-21 VITALS — BP 110/80 | HR 66 | Ht 64.5 in | Wt 166.0 lb

## 2012-08-21 DIAGNOSIS — E785 Hyperlipidemia, unspecified: Secondary | ICD-10-CM | POA: Insufficient documentation

## 2012-08-21 DIAGNOSIS — Z951 Presence of aortocoronary bypass graft: Secondary | ICD-10-CM

## 2012-08-21 DIAGNOSIS — I2510 Atherosclerotic heart disease of native coronary artery without angina pectoris: Secondary | ICD-10-CM

## 2012-08-21 NOTE — Assessment & Plan Note (Signed)
On statin therapy with her most recent lab work performed 02/27/12 revealing a total cholesterol 157, LDL of 66 and HDL of 78.

## 2012-08-21 NOTE — Assessment & Plan Note (Signed)
Status post coronary artery bypass grafting in January of 2013 after cardiac catheterization revealed left main, three-vessel disease with an ejection fraction of 50% and high anterior hypokinesia. She underwent bypass by Dr. Kathlee Nations Trigt with a LIMA to her LAD, vein to obtuse marginal branch and to the right coronary artery. Her postop course was uncomplicated. She did end up having a staph sternal wound infection requiring debridement and treatment with a wound VAC. Ultimately this healed and she was released by Dr.Van Trigt  in November of last year. She denies chest pain or shortness of breath. She did have a Myoview stress test performed 04/12/11 which was nonischemic.

## 2012-08-21 NOTE — Patient Instructions (Addendum)
Your physician wants you to follow-up in: 6 months with Laura Ingold NP and 1 year with Dr Berry.  You will receive a reminder letter in the mail two months in advance. If you don't receive a letter, please call our office to schedule the follow-up appointment.  

## 2012-08-21 NOTE — Progress Notes (Signed)
08/21/2012 [name] Long   1940-05-30  [id number]  Primary Physician [name] Herter, MD Primary Cardiologist: [name] Gess MD [name] Long   HPI:  The patient is a very pleasant 72 year old mildly overweight married Caucasian female, mother of 2, grandmother to 6 grandchildren, who I last saw in the office 3 months ago. She ended up being admitted January 12, 2011, with chest pain and was catheterized, revealing a left main 3-vessel disease with an EF of 50% and high anterior hypokinesia, for which she underwent coronary artery bypass grafting by Dr. Kathlee Nations Long with a LIMA to her LAD, vein to an obtuse marginal branch and to the right coronary artery. Postop course was uncomplicated. A Myoview in April was normal. Her lipid profile was excellent. She saw [name] Long in our office on July 15 complaining of chest pain, and the concern was musculoskeletal versus pericarditis. A 2D echocardiogram was entirely normal. She ended up seeing Dr. Donata Long, who diagnosed a staph sternal wound infection, requiring hospitalization and debridement. She had a wound VAC on and fortunately has completely healed, though she still has pain at her incision. She saw [name] Long in the office 6 months ago and has been doing well since that time      Current Outpatient Prescriptions  Medication Sig Dispense Refill  . aspirin 81 MG tablet Take 81 mg by mouth daily.      . Calcium Carbonate-Vit D-Min (CALCIUM 1200 PO) Take 1 tablet by mouth daily.      . carisoprodol (SOMA) 350 MG tablet Take 350 mg by mouth daily as needed.      . fentaNYL (DURAGESIC - DOSED MCG/HR) 50 MCG/HR Place 1 patch onto the skin every 3 (three) days.        . Fluticasone-Salmeterol (ADVAIR) 250-50 MCG/DOSE AEPB Inhale 1 puff into the lungs every 12 (twelve) hours.      . gabapentin (NEURONTIN) 600 MG tablet Take 600 mg by mouth 4 (four) times daily.       Marland Kitchen HYDROmorphone (DILAUDID) 4 MG tablet Take 4 mg by mouth  daily as needed. For pain      . hydroxychloroquine (PLAQUENIL) 200 MG tablet Take 400 mg by mouth every evening.      . metoprolol tartrate (LOPRESSOR) 25 MG tablet Take 25 mg by mouth 2 (two) times daily.      . montelukast (SINGULAIR) 10 MG tablet TAKE 1 TABLET ONCE A DAY  90 tablet  2  . Multiple Vitamin (MULITIVITAMIN WITH MINERALS) TABS Take 1 tablet by mouth daily.      . Olopatadine HCl (PATADAY OP) Apply 1 drop to eye daily.      Marland Kitchen omega-3 acid ethyl esters (LOVAZA) 1 G capsule Take 1 g by mouth daily.      . rosuvastatin (CRESTOR) 10 MG tablet Take 10 mg by mouth daily.      Marland Kitchen albuterol (PROVENTIL HFA;VENTOLIN HFA) 108 (90 BASE) MCG/ACT inhaler Inhale 1 puff into the lungs every 6 (six) hours as needed. For shortness of breath       No current facility-administered medications for this visit.    No Known Allergies  History   Social History  . Marital Status: Married    Spouse Name: N/A    Number of Children: N/A  . Years of Education: N/A   Occupational History  . Not on file.   Social History Main Topics  . Smoking status: Former Smoker    Quit date: 01/12/1980  .  Smokeless tobacco: Never Used  . Alcohol Use: No  . Drug Use: No  . Sexual Activity: Yes   Other Topics Concern  . Not on file   Social History Narrative  . No narrative on file     Review of Systems: General: negative for chills, fever, night sweats or weight changes.  Cardiovascular: negative for chest pain, dyspnea on exertion, edema, orthopnea, palpitations, paroxysmal nocturnal dyspnea or shortness of breath Dermatological: negative for rash Respiratory: negative for cough or wheezing Urologic: negative for hematuria Abdominal: negative for nausea, vomiting, diarrhea, bright red blood per rectum, melena, or hematemesis Neurologic: negative for visual changes, syncope, or dizziness All other systems reviewed and are otherwise negative except as noted above.    Blood pressure 110/80, pulse  66, height 5' 4.5" (1.638 m), weight 166 lb (75.297 kg).  General appearance: alert and no distress Neck: no adenopathy, no carotid bruit, no JVD, supple, symmetrical, trachea midline and thyroid not enlarged, symmetric, no tenderness/mass/nodules Lungs: clear to auscultation bilaterally Heart: regular rate and rhythm, S1, S2 normal, no murmur, click, rub or gallop Extremities: extremities normal, atraumatic, no cyanosis or edema  EKG normal sinus rhythm at 66 with inferior and anterolateral T wave inversion unchanged from prior EKGs  ASSESSMENT AND PLAN:   CAD (coronary artery disease): multivessel, good LVF 50% at cath Status post coronary artery bypass grafting in January of 2013 after cardiac catheterization revealed left main, three-vessel disease with an ejection fraction of 50% and high anterior hypokinesia. She underwent bypass by Dr. Kathlee Nations Long with a LIMA to her LAD, vein to obtuse marginal branch and to the right coronary artery. Her postop course was uncomplicated. She did end up having a staph sternal wound infection requiring debridement and treatment with a wound VAC. Ultimately this healed and she was released by [name] Long  in November of last year. She denies chest pain or shortness of breath. She did have a Myoview stress test performed 04/12/11 which was nonischemic.  Hyperlipidemia On statin therapy with her most recent lab work performed 02/27/12 revealing a total cholesterol 157, LDL of 66 and HDL of 78.      [name] Gess MD FACP,FACC,FAHA, [facility] 08/21/2012 11:31 AM

## 2012-09-16 ENCOUNTER — Ambulatory Visit (INDEPENDENT_AMBULATORY_CARE_PROVIDER_SITE_OTHER): Payer: Medicare Other | Admitting: Ophthalmology

## 2012-09-16 DIAGNOSIS — H43819 Vitreous degeneration, unspecified eye: Secondary | ICD-10-CM

## 2012-09-16 DIAGNOSIS — H2510 Age-related nuclear cataract, unspecified eye: Secondary | ICD-10-CM

## 2012-09-16 DIAGNOSIS — Z09 Encounter for follow-up examination after completed treatment for conditions other than malignant neoplasm: Secondary | ICD-10-CM

## 2012-09-23 ENCOUNTER — Encounter: Payer: Self-pay | Admitting: Family Medicine

## 2012-09-23 ENCOUNTER — Ambulatory Visit (INDEPENDENT_AMBULATORY_CARE_PROVIDER_SITE_OTHER): Payer: Medicare Other | Admitting: Family Medicine

## 2012-09-23 VITALS — BP 130/80 | HR 70 | Wt 169.0 lb

## 2012-09-23 DIAGNOSIS — H8110 Benign paroxysmal vertigo, unspecified ear: Secondary | ICD-10-CM

## 2012-09-23 DIAGNOSIS — Z23 Encounter for immunization: Secondary | ICD-10-CM

## 2012-09-23 DIAGNOSIS — I951 Orthostatic hypotension: Secondary | ICD-10-CM

## 2012-09-23 NOTE — Progress Notes (Signed)
  Subjective:    Patient ID: [name] Long, female    DOB: 1940/08/21, 72 y.o.   MRN: [id number]  HPI She had difficulty with benign positional vertigo and was sent to physical therapy for Epley maneuvers. It did help but did not make it go completely away. She would still have difficulty when she would lean her head to the left. She also notes increased difficulty with right-sided tinnitus. She also notes that when she goes from a lying to standing or sitting to standing she did have several seconds of dizziness but no other symptoms   Review of Systems     Objective:   Physical Exam alert and in no distress. EOMI. Tympanic membranes and canals are normal. Throat is clear. Tonsils are normal. Neck is supple without adenopathy or thyromegaly. Cardiac exam shows a regular sinus rhythm without murmurs or gallops. Lungs are clear to auscultation.        Assessment & Plan:  Need for prophylactic vaccination and inoculation against influenza - Plan: Flu Vaccine QUAD 36+ mos IM  Immunization due - Plan: Pneumococcal polysaccharide vaccine 23-valent greater than or equal to 2yo subcutaneous/IM  Postural hypotension  BPPV (benign paroxysmal positional vertigo)  explained she was having dizziness symptoms from 2 different issues. Discussed doing more Epley maneuvers however she is doing some maneuvers on herself at home. Also encouraged her go more slowly from lying to sitting to standing. She was comfortable with this. Flu shot given with risks and benefits discussed. Pneumovax also given since it has been about 10 years since her previous dosing

## 2012-09-23 NOTE — Patient Instructions (Signed)
Go from lying to sitting to standing more slowly. We have no good therapy for ringing in ears.

## 2012-11-01 ENCOUNTER — Encounter: Payer: Self-pay | Admitting: Internal Medicine

## 2012-11-08 ENCOUNTER — Other Ambulatory Visit: Payer: Self-pay | Admitting: Cardiovascular Disease

## 2012-11-08 NOTE — Telephone Encounter (Signed)
Rx was sent to pharmacy electronically. 

## 2013-02-13 ENCOUNTER — Telehealth: Payer: Self-pay | Admitting: Cardiovascular Disease

## 2013-02-13 MED ORDER — ROSUVASTATIN CALCIUM 10 MG PO TABS: ORAL_TABLET | ORAL | Status: DC

## 2013-02-13 MED ORDER — METOPROLOL TARTRATE 25 MG PO TABS: ORAL_TABLET | ORAL | Status: DC

## 2013-02-13 NOTE — Telephone Encounter (Signed)
Returned call.  Left message to call back before 4pm.  PLEASE GET THE NAME OF THE PHARMACY.  PRESCRIPTIONS ARE SENT ELECTRONICALLY.

## 2013-02-13 NOTE — Telephone Encounter (Signed)
Pt called back and stated pharmacy is [facility].  Informed Rxs will be sent to pharmacy.  Pt verbalized understanding and agreed w/ plan.  Refill(s) sent to pharmacy.

## 2013-02-13 NOTE — Telephone Encounter (Signed)
Pt has new insurance - needs refills on Crestor 10 mg and Metoprolol Tartrate 25 mg. Needs one year - faxed to [phone].

## 2013-03-10 ENCOUNTER — Ambulatory Visit (INDEPENDENT_AMBULATORY_CARE_PROVIDER_SITE_OTHER): Payer: Medicare HMO | Admitting: Cardiology

## 2013-03-10 ENCOUNTER — Encounter: Payer: Self-pay | Admitting: Cardiology

## 2013-03-10 VITALS — BP 152/96 | HR 69 | Ht 64.0 in | Wt 169.0 lb

## 2013-03-10 DIAGNOSIS — I2510 Atherosclerotic heart disease of native coronary artery without angina pectoris: Secondary | ICD-10-CM

## 2013-03-10 DIAGNOSIS — E785 Hyperlipidemia, unspecified: Secondary | ICD-10-CM

## 2013-03-10 DIAGNOSIS — Z951 Presence of aortocoronary bypass graft: Secondary | ICD-10-CM

## 2013-03-10 NOTE — Progress Notes (Addendum)
03/10/2013   PCP: [name], MD   Chief Complaint  Patient presents with  . Follow-up    Primary Cardiologist: Dr. Gwenlyn Found  HPI:  73 year old mildly overweight married Caucasian female, mother of 2, grandmother to 6 grandchildren, who is followed by Dr. Gwenlyn Found. She ended up being admitted January 12, 2011, with chest pain and was catheterized, revealing a left main 3-vessel disease with an EF of 50% and high anterior hypokinesia, for which she underwent coronary artery bypass grafting by Dr. Tharon Aquas Trigt with a LIMA to her LAD, vein to an obtuse marginal branch and to the right coronary artery. Postop course was uncomplicated. A Myoview in April was normal. Her lipid profile was excellent. I saw in our office on July 15 complaining of chest pain, and the concern was musculoskeletal versus pericarditis. A 2D echocardiogram was entirely normal. She ended up seeing Dr. Prescott Gum, who diagnosed a staph sternal wound infection, requiring hospitalization and debridement. She had a wound VAC on and fortunately has completely healed, though she still has pain at her incision.   She has been doing well.  Recently she has developed chest pressure after meals and several TUMs seem to help.  occ palpitations.  She drinks very little caffeine.  No nausea.   Pt due to see PCP very soon.  No SOB.       No Known Allergies  Current Outpatient Prescriptions  Medication Sig Dispense Refill  . aspirin 81 MG tablet Take 81 mg by mouth daily.      . Calcium Carbonate-Vit D-Min (CALCIUM 1200 PO) Take 1 tablet by mouth daily.      . carisoprodol (SOMA) 350 MG tablet Take 350 mg by mouth daily as needed.      . Fluticasone-Salmeterol (ADVAIR) 250-50 MCG/DOSE AEPB Inhale 1 puff into the lungs every 12 (twelve) hours.      . gabapentin (NEURONTIN) 600 MG tablet Take 600 mg by mouth 4 (four) times daily.       Marland Kitchen HYDROmorphone (DILAUDID) 4 MG tablet Take 4 mg by mouth daily as needed. For  pain      . hydroxychloroquine (PLAQUENIL) 200 MG tablet Take 400 mg by mouth every evening.      . metoprolol tartrate (LOPRESSOR) 25 MG tablet TAKE 1 TABLET TWICE A DAY  180 tablet  1  . montelukast (SINGULAIR) 10 MG tablet TAKE 1 TABLET ONCE A DAY  90 tablet  2  . Multiple Vitamin (MULITIVITAMIN WITH MINERALS) TABS Take 1 tablet by mouth daily.      . Olopatadine HCl (PATADAY OP) Apply 1 drop to eye daily.      Marland Kitchen omega-3 acid ethyl esters (LOVAZA) 1 G capsule Take 1 g by mouth daily.      . rosuvastatin (CRESTOR) 10 MG tablet TAKE 1 TABLET AT BEDTIME  90 tablet  1  . albuterol (PROVENTIL HFA;VENTOLIN HFA) 108 (90 BASE) MCG/ACT inhaler Inhale 1 puff into the lungs every 6 (six) hours as needed. For shortness of breath       No current facility-administered medications for this visit.    Past Medical History  Diagnosis Date  . Allergy     RHINITIS  . Asthma   . Fibromyalgia   . Fibromyalgia   . Chronic low back pain   . Osteoporosis     OSTEOPENIA  . PPD positive   . Connective tissue disease   . Urinary incontinence   .  Hypertension   . Crescendo angina, with EKG changes, new. 01/12/2011  . Abnormal EKG 01/12/2011  . Back pain, chronic--plans were for cervical disc surgery week of 01/16/11 01/12/2011  . Asthma 01/12/2011  . Coronary artery disease   . Status post coronary artery bypass grafting     Past Surgical History  Procedure Laterality Date  . Appendectomy    . Abdominal hysterectomy    . Bladder repair    . Cervical fusion    . Breast surgery      REDUCTION  . Carpal tunnel release    . Coronary artery bypass graft  01/14/2011    Procedure: CORONARY ARTERY BYPASS GRAFTING (CABG);  Surgeon: Tharon Aquas Adelene Idler, MD;  Location: [place];  Service: Open Heart Surgery;  Laterality: N/A;  CABG x three, using right greater saphenous vein harvested endoscopically  . Sternal wound debridement  07/27/2011    Procedure: STERNAL WOUND DEBRIDEMENT;  Surgeon: Ivin Poot, MD;   Location: [place];  Service: Open Heart Surgery;  Laterality: N/A;    SWF:UXNATFT:DD colds or fevers, no weight changes Skin:no rashes or ulcers HEENT:no blurred vision, no congestion CV:see HPI PUL:see HPI GI:no diarrhea constipation or melena, +? Indigestion chest pressure GU:no hematuria, no dysuria MS:no joint pain, no claudication Neuro:no syncope, no lightheadedness Endo:no diabetes, no thyroid disease  PHYSICAL EXAM BP 152/96  Pulse 69  Ht 5\' 4"  (1.626 m)  Wt 169 lb (76.658 kg)  BMI 28.99 kg/m2 General:Pleasant affect, NAD Skin:Warm and dry, brisk capillary refill HEENT:normocephalic, sclera clear, mucus membranes moist Neck:supple, no JVD, no bruits  Heart:S1S2 RRR without murmur, gallup, rub or click Lungs:clear without rales, rhonchi, or wheezes UKG:URKY, non tender, + BS, do not palpate liver spleen or masses Ext:no lower ext edema, 2+ pedal pulses, 2+ radial pulses Neuro:alert and oriented, MAE, follows commands, + facial symmetry  EKG:SR with ST T wave changes, though no different from previous EKGs  ASSESSMENT AND PLAN CAD (coronary artery disease): multivessel, good LVF 50% at cath Chest pressure with indigestion, after meals and improves with TUMS.  If becomes more significant or associated symptoms would do stress myoview.  Last one post CABG without ischemia.       Hyperlipidemia It has been one year since chol checked will order, to have here or with PCP  Asthma Using inhaler more often due to weather.  S/P CABG x 3: (lima-lad,svg-om,svg-rca) EKG without acute changes, plan to follow up with Dr. Gwenlyn Found in 6 months unless problems earlier.   Follow up with Dr. Gwenlyn Found in 6 months though if chest pressure not relieved dwith Prilosec she will call us.  Fasting Chol. With next PCP visit.

## 2013-03-10 NOTE — Assessment & Plan Note (Signed)
EKG without acute changes, plan to follow up with Dr. Gwenlyn Found in 6 months unless problems earlier.

## 2013-03-10 NOTE — Assessment & Plan Note (Signed)
Using inhaler more often due to weather.

## 2013-03-10 NOTE — Assessment & Plan Note (Signed)
Chest pressure with indigestion, after meals and improves with TUMS.  If becomes more significant or associated symptoms would do stress myoview.  Last one post CABG without ischemia.

## 2013-03-10 NOTE — Assessment & Plan Note (Signed)
It has been one year since chol checked will order, to have here or with PCP

## 2013-03-10 NOTE — Patient Instructions (Signed)
Have fasting labs done at PCP office.  Try over the counter Prilosec, prevacid or protonix.  Take daily for 2 weeks to see if improvement, in none may need to see GI physician.  Call if worsening chest tightness or pain.    Follow up with Dr. Gwenlyn Found in 6 months unless further problems.

## 2013-03-11 ENCOUNTER — Ambulatory Visit (INDEPENDENT_AMBULATORY_CARE_PROVIDER_SITE_OTHER): Payer: Medicare HMO | Admitting: Family Medicine

## 2013-03-11 ENCOUNTER — Encounter: Payer: Self-pay | Admitting: Family Medicine

## 2013-03-11 VITALS — BP 140/84 | HR 64 | Wt 167.0 lb

## 2013-03-11 DIAGNOSIS — H9319 Tinnitus, unspecified ear: Secondary | ICD-10-CM | POA: Insufficient documentation

## 2013-03-11 DIAGNOSIS — Z79899 Other long term (current) drug therapy: Secondary | ICD-10-CM

## 2013-03-11 DIAGNOSIS — M069 Rheumatoid arthritis, unspecified: Secondary | ICD-10-CM | POA: Insufficient documentation

## 2013-03-11 DIAGNOSIS — H918X9 Other specified hearing loss, unspecified ear: Secondary | ICD-10-CM

## 2013-03-11 DIAGNOSIS — G8929 Other chronic pain: Secondary | ICD-10-CM

## 2013-03-11 DIAGNOSIS — K219 Gastro-esophageal reflux disease without esophagitis: Secondary | ICD-10-CM

## 2013-03-11 DIAGNOSIS — M549 Dorsalgia, unspecified: Secondary | ICD-10-CM

## 2013-03-11 DIAGNOSIS — H9190 Unspecified hearing loss, unspecified ear: Secondary | ICD-10-CM | POA: Insufficient documentation

## 2013-03-11 DIAGNOSIS — Z951 Presence of aortocoronary bypass graft: Secondary | ICD-10-CM

## 2013-03-11 DIAGNOSIS — J45909 Unspecified asthma, uncomplicated: Secondary | ICD-10-CM

## 2013-03-11 DIAGNOSIS — E785 Hyperlipidemia, unspecified: Secondary | ICD-10-CM

## 2013-03-11 LAB — CBC WITH DIFFERENTIAL/PLATELET
Basophils Absolute: 0 10*3/uL (ref 0.0–0.1)
Basophils Relative: 0 % (ref 0–1)
Eosinophils Absolute: 0.2 10*3/uL (ref 0.0–0.7)
Eosinophils Relative: 3 % (ref 0–5)
HCT: 41.9 % (ref 36.0–46.0)
Hemoglobin: 14.3 g/dL (ref 12.0–15.0)
LYMPHS PCT: 27 % (ref 12–46)
Lymphs Abs: 1.6 10*3/uL (ref 0.7–4.0)
MCH: 29.7 pg (ref 26.0–34.0)
MCHC: 34.1 g/dL (ref 30.0–36.0)
MCV: 87.1 fL (ref 78.0–100.0)
MONOS PCT: 6 % (ref 3–12)
Monocytes Absolute: 0.4 10*3/uL (ref 0.1–1.0)
Neutro Abs: 3.8 10*3/uL (ref 1.7–7.7)
Neutrophils Relative %: 64 % (ref 43–77)
PLATELETS: 173 10*3/uL (ref 150–400)
RBC: 4.81 MIL/uL (ref 3.87–5.11)
RDW: 13.7 % (ref 11.5–15.5)
WBC: 6 10*3/uL (ref 4.0–10.5)

## 2013-03-11 LAB — COMPREHENSIVE METABOLIC PANEL
ALT: 13 U/L (ref 0–35)
AST: 21 U/L (ref 0–37)
Albumin: 4.4 g/dL (ref 3.5–5.2)
Alkaline Phosphatase: 80 U/L (ref 39–117)
BUN: 20 mg/dL (ref 6–23)
CALCIUM: 9.2 mg/dL (ref 8.4–10.5)
CHLORIDE: 107 meq/L (ref 96–112)
CO2: 30 mEq/L (ref 19–32)
Creat: 0.87 mg/dL (ref 0.50–1.10)
GLUCOSE: 94 mg/dL (ref 70–99)
Potassium: 4.6 mEq/L (ref 3.5–5.3)
SODIUM: 144 meq/L (ref 135–145)
TOTAL PROTEIN: 7.1 g/dL (ref 6.0–8.3)
Total Bilirubin: 0.5 mg/dL (ref 0.2–1.2)

## 2013-03-11 LAB — LIPID PANEL
CHOLESTEROL: 152 mg/dL (ref 0–200)
HDL: 82 mg/dL (ref >39)
LDL Cholesterol: 61 mg/dL (ref 0–99)
TRIGLYCERIDES: 47 mg/dL (ref <150)
Total CHOL/HDL Ratio: 1.9 Ratio
VLDL: 9 mg/dL (ref 0–40)

## 2013-03-11 MED ORDER — FLUTICASONE-SALMETEROL 250-50 MCG/DOSE IN AEPB: 1.0000 | INHALATION_SPRAY | Freq: Two times a day (BID) | RESPIRATORY_TRACT | Status: DC

## 2013-03-11 MED ORDER — ALBUTEROL SULFATE HFA 108 (90 BASE) MCG/ACT IN AERS
1.0000 | INHALATION_SPRAY | Freq: Four times a day (QID) | RESPIRATORY_TRACT | Status: DC | PRN
Start: 2013-03-11 — End: 2013-08-18

## 2013-03-11 MED ORDER — MONTELUKAST SODIUM 10 MG PO TABS: ORAL_TABLET | ORAL | Status: DC

## 2013-03-11 NOTE — Patient Instructions (Signed)
Take the 20 mg Prilosec about an hour before dinner and if that doesn't work then take 2 and if that doesn't work call me.

## 2013-03-11 NOTE — Progress Notes (Signed)
   Subjective:    Patient ID: [name] Long, female    DOB: [birth year], 73 y.o.   MRN: [id number]  HPI She is here for a consult. She does have reflux disease and has had difficulty with indigestion, chest discomfort and reflux type symptoms off and on for several years. She has never been placed on a PPI. She has had previous endoscopies which apparently were negative. She also has a history of asthma and needs her medications renewed. He does complain of ringing in the right ear that is becoming quite bothersome. She does complain of decreased hearing but no dizziness. She has a long history of back pain and is being seen at a back care clinic. She has a previous history of heart disease and is seen regularly by her cardiologist. She was also seen recently by her rheumatologist at [facility]. She has no other concerns or questions.   Review of Systems     Objective:   Physical Exam alert and in no distress. Tympanic membranes and canals are normal. Throat is clear. Tonsils are normal. Neck is supple without adenopathy or thyromegaly. Cardiac exam shows a regular sinus rhythm without murmurs or gallops. Lungs are clear to auscultation. Hearing test did show high frequency hearing loss.       Assessment & Plan:  GERD (gastroesophageal reflux disease)  Asthma, chronic  S/P CABG x 3: (lima-lad,svg-om,svg-rca) - Plan: CBC with Differential, Comprehensive metabolic panel, Lipid panel  Back pain, chronic--plans were for cervical disc surgery week of 01/16/11  Hyperlipidemia - Plan: Lipid panel  Encounter for long-term (current) use of other medications - Plan: CBC with Differential, Comprehensive metabolic panel, Lipid panel  High frequency hearing loss - Plan: Hearing screening, Ambulatory referral to Audiology  Rheumatoid arthritis  Tinnitus  her meds will be renewed. Did recommend Prilosec at 20 mg increasing to 40 and if continued difficulty to call me. She will also need  referral for colonoscopy. We'll also  refer to audiology.

## 2013-03-13 ENCOUNTER — Encounter: Payer: Self-pay | Admitting: Cardiology

## 2013-04-03 NOTE — Progress Notes (Signed)
PCP is [name], MD Referring Provider is Tysinger, Camelia Eng, PA-C  Chief Complaint  Patient presents with  . Wound Check    C/O red, swollen, painful knot on sternal incision, S/P  CABG on 01/14/11     HPI: Patient presented to the office with a superficial sternal wound infection after prior CABG. She will be admitted to the hospital for IV antibiotic therapy and surgical management   Past Medical History  Diagnosis Date  . Allergy     RHINITIS  . Asthma   . Fibromyalgia   . Fibromyalgia   . Chronic low back pain   . Osteoporosis     OSTEOPENIA  . PPD positive   . Connective tissue disease   . Urinary incontinence   . Hypertension   . Crescendo angina, with EKG changes, new. 01/12/2011  . Abnormal EKG 01/12/2011  . Back pain, chronic--plans were for cervical disc surgery week of 01/16/11 01/12/2011  . Asthma 01/12/2011  . Coronary artery disease   . Status post coronary artery bypass grafting     Past Surgical History  Procedure Laterality Date  . Appendectomy    . Abdominal hysterectomy    . Bladder repair    . Cervical fusion    . Breast surgery      REDUCTION  . Carpal tunnel release    . Coronary artery bypass graft  01/14/2011    Procedure: CORONARY ARTERY BYPASS GRAFTING (CABG);  Surgeon: Tharon Aquas Adelene Idler, MD;  Location: [place];  Service: Open Heart Surgery;  Laterality: N/A;  CABG x three, using right greater saphenous vein harvested endoscopically  . Sternal wound debridement  07/27/2011    Procedure: STERNAL WOUND DEBRIDEMENT;  Surgeon: Ivin Poot, MD;  Location: [place];  Service: Open Heart Surgery;  Laterality: N/A;    Family History  Problem Relation Age of Onset  . Adopted: Yes  . Peripheral vascular disease Mother   . Hypertension Mother   . Cancer Father   . Hypertension Sister   . Hypertension Brother     Social History History  Substance Use Topics  . Smoking status: Former Smoker    Quit date: 01/12/1980  . Smokeless  tobacco: Never Used  . Alcohol Use: No    Current Outpatient Prescriptions  Medication Sig Dispense Refill  . gabapentin (NEURONTIN) 600 MG tablet Take 600 mg by mouth 4 (four) times daily.       . Multiple Vitamin (MULITIVITAMIN WITH MINERALS) TABS Take 1 tablet by mouth daily.      Marland Kitchen albuterol (PROVENTIL HFA;VENTOLIN HFA) 108 (90 BASE) MCG/ACT inhaler Inhale 1 puff into the lungs every 6 (six) hours as needed. For shortness of breath  1 Inhaler  1  . aspirin 81 MG tablet Take 81 mg by mouth daily.      . Calcium Carbonate-Vit D-Min (CALCIUM 1200 PO) Take 1 tablet by mouth daily.      . carisoprodol (SOMA) 350 MG tablet Take 350 mg by mouth daily as needed.      . Fluticasone-Salmeterol (ADVAIR) 250-50 MCG/DOSE AEPB Inhale 1 puff into the lungs every 12 (twelve) hours.  60 each  11  . HYDROmorphone (DILAUDID) 4 MG tablet Take 4 mg by mouth daily as needed. For pain      . hydroxychloroquine (PLAQUENIL) 200 MG tablet Take 400 mg by mouth every evening.      . metoprolol tartrate (LOPRESSOR) 25 MG tablet TAKE 1 TABLET TWICE A DAY  180 tablet  1  . montelukast (SINGULAIR) 10 MG tablet TAKE 1 TABLET ONCE A DAY  90 tablet  3  . Olopatadine HCl (PATADAY OP) Apply 1 drop to eye daily.      . rosuvastatin (CRESTOR) 10 MG tablet TAKE 1 TABLET AT BEDTIME  90 tablet  1   No current facility-administered medications for this visit.    No Known Allergies  Review of Systems  BP 155/86  Pulse 85  Temp(Src) 99.8 F (37.7 C)  Resp 18  Ht 5\' 5"  (1.651 m)  Wt 159 lb (72.122 kg)  BMI 26.46 kg/m2  SpO2 100% Physical Exam Superficial sternal wound infection Heart rate regular  Diagnostic Tests:   Impression: Superficial sternal wound infection after CABG Patient will be admitted for IV antibiotic therapy See admission H&P  Plan: Admit for therapy

## 2013-04-16 ENCOUNTER — Telehealth: Payer: Self-pay | Admitting: Family Medicine

## 2013-04-17 NOTE — Telephone Encounter (Signed)
P.A. PROVENTIL approved til 01/01/14, pt informed

## 2013-06-17 ENCOUNTER — Telehealth: Payer: Self-pay | Admitting: Internal Medicine

## 2013-06-17 NOTE — Telephone Encounter (Signed)
Faxed over medical records to [facility] @ [phone] on may 21

## 2013-06-26 ENCOUNTER — Encounter: Payer: Self-pay | Admitting: Internal Medicine

## 2013-06-26 ENCOUNTER — Telehealth: Payer: Self-pay | Admitting: Family Medicine

## 2013-06-26 NOTE — Telephone Encounter (Signed)
Pt states she will call her insurance provider so they can inform her of who is in network for a GI doc

## 2013-06-26 NOTE — Telephone Encounter (Signed)
Pt's husband came in for an appointment today. He stated that [name] Long has been waiting for a call back. He states that when her colonoscopy was scheduled it was made with a practice that is out of network. Please call pt and assist with rescheduling to a in network provider.

## 2013-07-22 ENCOUNTER — Ambulatory Visit (AMBULATORY_SURGERY_CENTER): Payer: Self-pay | Admitting: *Deleted

## 2013-07-22 VITALS — Ht 65.0 in | Wt 172.0 lb

## 2013-07-22 DIAGNOSIS — Z1211 Encounter for screening for malignant neoplasm of colon: Secondary | ICD-10-CM

## 2013-07-22 MED ORDER — NA SULFATE-K SULFATE-MG SULF 17.5-3.13-1.6 GM/177ML PO SOLN: 1.0000 | Freq: Once | ORAL | Status: DC

## 2013-07-22 NOTE — Progress Notes (Signed)
No allergies to eggs or soy. No problems with anesthesia.  Pt given Emmi instructions for colonoscopy  No oxygen use  No diet drug use  

## 2013-08-05 ENCOUNTER — Encounter: Payer: Self-pay | Admitting: Internal Medicine

## 2013-08-05 ENCOUNTER — Ambulatory Visit (AMBULATORY_SURGERY_CENTER): Payer: Medicare HMO | Admitting: Internal Medicine

## 2013-08-05 VITALS — BP 104/63 | HR 67 | Temp 96.4°F | Resp 18 | Ht 65.0 in | Wt 172.0 lb

## 2013-08-05 DIAGNOSIS — D126 Benign neoplasm of colon, unspecified: Secondary | ICD-10-CM

## 2013-08-05 DIAGNOSIS — Z1211 Encounter for screening for malignant neoplasm of colon: Secondary | ICD-10-CM

## 2013-08-05 DIAGNOSIS — Z860100 Personal history of colon polyps, unspecified: Secondary | ICD-10-CM

## 2013-08-05 HISTORY — DX: Personal history of colon polyps, unspecified: Z86.0100

## 2013-08-05 HISTORY — DX: Personal history of colonic polyps: Z86.010

## 2013-08-05 MED ORDER — SODIUM CHLORIDE 0.9 % IV SOLN: 500.0000 mL | INTRAVENOUS | Status: DC

## 2013-08-05 NOTE — Progress Notes (Signed)
A/ox3, pleased with MAC, report to RN 

## 2013-08-05 NOTE — Patient Instructions (Addendum)
I found and removed one polyp that looks benign.   Everything else was ok.  I will let you know pathology results and when to have another routine colonoscopy by mail.  I appreciate the opportunity to care for you. Gatha Mayer, MD, FACG   YOU HAD AN ENDOSCOPIC PROCEDURE TODAY AT [facility]: Refer to the procedure report that was given to you for any specific questions about what was found during the examination.  If the procedure report does not answer your questions, please call your gastroenterologist to clarify.  If you requested that your care partner not be given the details of your procedure findings, then the procedure report has been included in a sealed envelope for you to review at your convenience later.  YOU SHOULD EXPECT: Some feelings of bloating in the abdomen. Passage of more gas than usual.  Walking can help get rid of the air that was put into your GI tract during the procedure and reduce the bloating. If you had a lower endoscopy (such as a colonoscopy or flexible sigmoidoscopy) you may notice spotting of blood in your stool or on the toilet paper. If you underwent a bowel prep for your procedure, then you may not have a normal bowel movement for a few days.  DIET: Your first meal following the procedure should be a light meal and then it is ok to progress to your normal diet.  A half-sandwich or bowl of soup is an example of a good first meal.  Heavy or fried foods are harder to digest and may make you feel nauseous or bloated.  Likewise meals heavy in dairy and vegetables can cause extra gas to form and this can also increase the bloating.  Drink plenty of fluids but you should avoid alcoholic beverages for 24 hours.  ACTIVITY: Your care partner should take you home directly after the procedure.  You should plan to take it easy, moving slowly for the rest of the day.  You can resume normal activity the day after the procedure however you should NOT DRIVE or  use heavy machinery for 24 hours (because of the sedation medicines used during the test).    SYMPTOMS TO REPORT IMMEDIATELY: A gastroenterologist can be reached at any hour.  During normal business hours, 8:30 AM to 5:00 PM Monday through Friday, call [phone].  After hours and on weekends, please call the GI answering service at [phone] who will take a message and have the physician on call contact you.   Following lower endoscopy (colonoscopy or flexible sigmoidoscopy):  Excessive amounts of blood in the stool  Significant tenderness or worsening of abdominal pains  Swelling of the abdomen that is new, acute  Fever of 100F or higher FOLLOW UP: If any biopsies were taken you will be contacted by phone or by letter within the next 1-3 weeks.  Call your gastroenterologist if you have not heard about the biopsies in 3 weeks.  Our staff will call the home number listed on your records the next business day following your procedure to check on you and address any questions or concerns that you may have at that time regarding the information given to you following your procedure. This is a courtesy call and so if there is no answer at the home number and we have not heard from you through the emergency physician on call, we will assume that you have returned to your regular daily activities without incident.  SIGNATURES/CONFIDENTIALITY: You  and/or your care partner have signed paperwork which will be entered into your electronic medical record.  These signatures attest to the fact that that the information above on your After Visit Summary has been reviewed and is understood.  Full responsibility of the confidentiality of this discharge information lies with you and/or your care-partner.

## 2013-08-05 NOTE — Progress Notes (Signed)
Called to room to assist during endoscopic procedure.  Patient ID and intended procedure confirmed with present staff. Received instructions for my participation in the procedure from the performing physician.  

## 2013-08-05 NOTE — Op Note (Signed)
[facility]  [address] [place], [address]   COLONOSCOPY PROCEDURE REPORT  [name], [name] Long  MR#: [id number] BIRTHDATE: 12/02/1940 , 72  yrs. old GENDER: Female ENDOSCOPIST: Gatha Mayer, MD, [facility] PROCEDURE DATE:  08/05/2013 PROCEDURE:   Colonoscopy with snare polypectomy First Screening Colonoscopy - Avg.  risk and is 50 yrs.  old or older - No.  Prior Negative Screening - Now for repeat screening. 10 or more years since last screening  History of Adenoma - Now for follow-up colonoscopy & has been > or = to 3 yrs.  N/A  Polyps Removed Today? Yes. ASA CLASS:   Class III INDICATIONS:average risk screening and Last colonoscopy performed 10 years ago. MEDICATIONS: Propofol (Diprivan) 210 mg IV, MAC sedation, administered by CRNA, and These medications were titrated to patient response per physician's verbal order  DESCRIPTION OF PROCEDURE:   After the risks benefits and alternatives of the procedure were thoroughly explained, informed consent was obtained.  A digital rectal exam revealed no abnormalities of the rectum.   The LB [id number]  endoscope was introduced through the anus and advanced to the cecum, which was identified by both the appendix and ileocecal valve. No adverse events experienced.   The quality of the prep was excellent using Suprep  The instrument was then slowly withdrawn as the colon was fully examined.  OLON FINDINGS: A sessile polyp measuring 7 mm in size was found in the ascending colon.  A polypectomy was performed with a cold snare.  The resection was complete and the polyp tissue was completely retrieved.   Mild diverticulosis was noted in the sigmoid colon.   The colon mucosa was otherwise normal.   A right colon retroflexion was performed.  Retroflexed views revealed no abnormalities. The time to cecum=2 minutes 49 seconds.  Withdrawal time=7 minutes 57 seconds.  The scope was withdrawn and the procedure  completed. COMPLICATIONS: There were no complications.  ENDOSCOPIC IMPRESSION: 1.   Sessile polyp measuring 7 mm in size was found in the ascending colon; polypectomy was performed with a cold snare 2.   Mild diverticulosis was noted in the sigmoid colon 3.   The colon mucosa was otherwise normal - excellent prep  RECOMMENDATIONS: Timing of repeat colonoscopy will be determined by pathology findings.   eSigned:  Gatha Mayer, MD, [facility] 08/05/2013 11:13 AM   cc: The Patient

## 2013-08-06 ENCOUNTER — Telehealth: Payer: Self-pay

## 2013-08-06 NOTE — Telephone Encounter (Signed)
  Follow up Call-  Call back number 08/05/2013  Post procedure Call Back phone  # [phone]  Permission to leave phone message Yes     Patient questions:  Do you have a fever, pain , or abdominal swelling? No. Pain Score  0 *  Have you tolerated food without any problems? Yes.    Have you been able to return to your normal activities? Yes.    Do you have any questions about your discharge instructions: Diet   No. Medications  No. Follow up visit  No.  Do you have questions or concerns about your Care? No.  Actions: * If pain score is 4 or above: No action needed, pain <4.

## 2013-08-12 ENCOUNTER — Encounter: Payer: Self-pay | Admitting: Internal Medicine

## 2013-08-12 NOTE — Progress Notes (Signed)
Quick Note:  7 mm adenoma - consider repeating a colonoscopy in 5 years (age 73) ______

## 2013-08-18 ENCOUNTER — Telehealth: Payer: Self-pay | Admitting: Family Medicine

## 2013-08-18 DIAGNOSIS — J4520 Mild intermittent asthma, uncomplicated: Secondary | ICD-10-CM

## 2013-08-18 MED ORDER — ALBUTEROL SULFATE HFA 108 (90 BASE) MCG/ACT IN AERS: 1.0000 | INHALATION_SPRAY | Freq: Four times a day (QID) | RESPIRATORY_TRACT | Status: DC | PRN

## 2013-08-18 NOTE — Telephone Encounter (Signed)
Proventil renewed

## 2013-09-01 ENCOUNTER — Other Ambulatory Visit: Payer: Self-pay | Admitting: Cardiovascular Disease

## 2013-09-01 NOTE — Telephone Encounter (Signed)
Rx was sent to pharmacy electronically. 

## 2013-09-03 ENCOUNTER — Other Ambulatory Visit: Payer: Self-pay | Admitting: Cardiovascular Disease

## 2013-09-03 ENCOUNTER — Encounter: Payer: Self-pay | Admitting: Family Medicine

## 2013-09-03 NOTE — Telephone Encounter (Signed)
Refilled 09/01/13

## 2013-09-16 ENCOUNTER — Ambulatory Visit (INDEPENDENT_AMBULATORY_CARE_PROVIDER_SITE_OTHER): Payer: Medicare Other | Admitting: Ophthalmology

## 2013-09-24 ENCOUNTER — Ambulatory Visit (INDEPENDENT_AMBULATORY_CARE_PROVIDER_SITE_OTHER): Payer: Medicare HMO | Admitting: Ophthalmology

## 2013-09-24 DIAGNOSIS — H43819 Vitreous degeneration, unspecified eye: Secondary | ICD-10-CM

## 2013-09-24 DIAGNOSIS — H2510 Age-related nuclear cataract, unspecified eye: Secondary | ICD-10-CM

## 2013-09-24 DIAGNOSIS — Z09 Encounter for follow-up examination after completed treatment for conditions other than malignant neoplasm: Secondary | ICD-10-CM

## 2013-10-03 ENCOUNTER — Encounter: Payer: Self-pay | Admitting: *Deleted

## 2013-10-07 ENCOUNTER — Ambulatory Visit (INDEPENDENT_AMBULATORY_CARE_PROVIDER_SITE_OTHER): Payer: Medicare HMO | Admitting: Cardiovascular Disease

## 2013-10-07 ENCOUNTER — Encounter: Payer: Self-pay | Admitting: Cardiovascular Disease

## 2013-10-07 VITALS — BP 102/64 | HR 67 | Ht 65.0 in | Wt 173.3 lb

## 2013-10-07 DIAGNOSIS — I25810 Atherosclerosis of coronary artery bypass graft(s) without angina pectoris: Secondary | ICD-10-CM

## 2013-10-07 DIAGNOSIS — E785 Hyperlipidemia, unspecified: Secondary | ICD-10-CM

## 2013-10-07 DIAGNOSIS — Z951 Presence of aortocoronary bypass graft: Secondary | ICD-10-CM

## 2013-10-07 NOTE — Assessment & Plan Note (Signed)
On statin therapy with her most recent lipid profile performed 03/11/13 revealing a total cholesterol of 152, LDL 61 and HDL of 82.

## 2013-10-07 NOTE — Progress Notes (Signed)
10/07/2013 [name] Long   [birth year]  [id number]  Primary Physician [name], MD Primary Cardiologist: Lorretta Harp MD Renae Gloss   HPI:  The patient is a very pleasant 73 year old mildly overweight married Caucasian female, mother of 2, grandmother to 6 grandchildren, who I last saw in the office 14 months ago. She ended up being admitted January 12, 2011, with chest pain and was catheterized, revealing a left main 3-vessel disease with an EF of 50% and high anterior hypokinesia, for which she underwent coronary artery bypass grafting by Dr. Tharon Aquas Trigt with a LIMA to her LAD, vein to an obtuse marginal branch and to the right coronary artery. Postop course was uncomplicated. A Myoview in April was normal. Her lipid profile was excellent. She saw Cecilie Kicks in our office on July 15 complaining of chest pain, and the concern was musculoskeletal versus pericarditis. A 2D echocardiogram was entirely normal. She ended up seeing Dr. Prescott Gum, who diagnosed a staph sternal wound infection, requiring hospitalization and debridement. She had a wound VAC on and fortunately has completely healed. She saw Cecilie Kicks in the office 6 months ago and has been doing well since that time    Current Outpatient Prescriptions  Medication Sig Dispense Refill  . albuterol (PROVENTIL HFA;VENTOLIN HFA) 108 (90 BASE) MCG/ACT inhaler Inhale 1 puff into the lungs every 6 (six) hours as needed. For shortness of breath  1 Inhaler  1  . aspirin 81 MG tablet Take 81 mg by mouth daily.      . Calcium Carbonate-Vit D-Min (CALCIUM 1200 PO) Take 1 tablet by mouth daily.      . carisoprodol (SOMA) 350 MG tablet Take 350 mg by mouth daily as needed.      . CRESTOR 10 MG tablet TAKE 1 TABLET AT BEDTIME  90 tablet  2  . Fluticasone-Salmeterol (ADVAIR) 250-50 MCG/DOSE AEPB Inhale 1 puff into the lungs every 12 (twelve) hours.  60 each  11  . gabapentin (NEURONTIN) 600 MG tablet Take 600 mg by  mouth 5 (five) times daily.       Marland Kitchen HYDROmorphone (DILAUDID) 4 MG tablet Take 4 mg by mouth daily as needed. For pain      . hydroxychloroquine (PLAQUENIL) 200 MG tablet Take 400 mg by mouth every evening.      . metoprolol tartrate (LOPRESSOR) 25 MG tablet TAKE 1 TABLET TWICE A DAY  180 tablet  2  . montelukast (SINGULAIR) 10 MG tablet TAKE 1 TABLET ONCE A DAY  90 tablet  3  . Multiple Vitamin (MULITIVITAMIN WITH MINERALS) TABS Take 1 tablet by mouth daily.      . Olopatadine HCl (PATADAY OP) Apply 1 drop to eye as needed (allergies).       . Omega-3 Fatty Acids (FISH OIL) 1000 MG CAPS Take by mouth daily.      Vladimir Faster Glycol-Propyl Glycol (SYSTANE OP) Apply to eye as needed.       No current facility-administered medications for this visit.    No Known Allergies  History   Social History  . Marital Status: Married    Spouse Name: N/A    Number of Children: N/A  . Years of Education: N/A   Occupational History  . Not on file.   Social History Main Topics  . Smoking status: Former Smoker    Quit date: 01/12/1980  . Smokeless tobacco: Never Used  . Alcohol Use: No  . Drug Use: No  .  Sexual Activity: Yes   Other Topics Concern  . Not on file   Social History Narrative  . No narrative on file     Review of Systems: General: negative for chills, fever, night sweats or weight changes.  Cardiovascular: negative for chest pain, dyspnea on exertion, edema, orthopnea, palpitations, paroxysmal nocturnal dyspnea or shortness of breath Dermatological: negative for rash Respiratory: negative for cough or wheezing Urologic: negative for hematuria Abdominal: negative for nausea, vomiting, diarrhea, bright red blood per rectum, melena, or hematemesis Neurologic: negative for visual changes, syncope, or dizziness All other systems reviewed and are otherwise negative except as noted above.    Blood pressure 102/64, pulse 67, height 5\' 5"  (1.651 m), weight 173 lb 4.8 oz (78.608  kg).  General appearance: alert and no distress Neck: no adenopathy, no carotid bruit, no JVD, supple, symmetrical, trachea midline and thyroid not enlarged, symmetric, no tenderness/mass/nodules Lungs: clear to auscultation bilaterally Heart: regular rate and rhythm, S1, S2 normal, no murmur, click, rub or gallop Extremities: extremities normal, atraumatic, no cyanosis or edema  EKG normal sinus rhythm at 67 with incomplete right bundle branch block unchanged from prior EKGs  ASSESSMENT AND PLAN:   S/P CABG x 3: (lima-lad,svg-om,svg-rca) History of CAD status post coronary bypass grafting by Dr. Tharon Aquas Trigt January 2013 with a LIMA to her LAD, vein to obtuse marginal branch and to the right coronary artery. A Myoview performed April 2014 with nonischemic. She denies chest pain or shortness of breath.  Hyperlipidemia On statin therapy with her most recent lipid profile performed 03/11/13 revealing a total cholesterol of 152, LDL 61 and HDL of 82.      Lorretta Harp MD FACP,FACC,FAHA, [facility] 10/07/2013 10:51 AM

## 2013-10-07 NOTE — Patient Instructions (Signed)
Dr Gwenlyn Found recommends that you schedule a follow-up appointment in 6 months with an extender - Cecilie Kicks, NP.  Dr Gwenlyn Found wants you to follow-up in 12 months. You will receive a reminder letter in the mail two months in advance. If you don't receive a letter, please call our office to schedule the follow-up appointment.

## 2013-10-07 NOTE — Assessment & Plan Note (Signed)
History of CAD status post coronary bypass grafting by Dr. Tharon Aquas Trigt January 2013 with a LIMA to her LAD, vein to obtuse marginal branch and to the right coronary artery. A Myoview performed April 2014 with nonischemic. She denies chest pain or shortness of breath.

## 2013-10-30 ENCOUNTER — Ambulatory Visit (INDEPENDENT_AMBULATORY_CARE_PROVIDER_SITE_OTHER): Payer: Medicare HMO | Admitting: Family Medicine

## 2013-10-30 ENCOUNTER — Encounter: Payer: Self-pay | Admitting: Family Medicine

## 2013-10-30 VITALS — BP 120/70 | HR 71 | Wt 175.0 lb

## 2013-10-30 DIAGNOSIS — J385 Laryngeal spasm: Secondary | ICD-10-CM

## 2013-10-30 NOTE — Progress Notes (Signed)
   Subjective:    Patient ID: [name] Long, female    DOB: [birth year], 73 y.o.   MRN: [id number]  HPI She complains of having difficulty with an inability to take a deep breath then followed by wheezing. Since been getting worse over the last year especially in the last 6 months. Normally she uses her inhaler maybe twice per month. She does have underlying allergies. She cannot relate this to physical activities or stress. She is on no new meds and has no reflux type symptoms. She notes when she has difficulty with the breathing she also develops the relatively quick onset of nasal congestion and postnasal drainage   Review of Systems     Objective:   Physical Exam alert and in no distress. Tympanic membranes and canals are normal. Throat is clear. Tonsils are normal. Neck is supple without adenopathy or thyromegaly. Cardiac exam shows a regular sinus rhythm without murmurs or gallops. Lungs are clear to auscultation.        Assessment & Plan:  Laryngospasm - Plan: Ambulatory referral to ENT  there is no good reason why she would have spasm in her symptoms really don't sound like asthma with the relatively abrupt onset of difficulty with breathing.

## 2013-12-11 ENCOUNTER — Encounter (HOSPITAL_COMMUNITY): Payer: Self-pay | Admitting: Cardiovascular Disease

## 2014-01-05 ENCOUNTER — Ambulatory Visit
Admission: RE | Admit: 2014-01-05 | Discharge: 2014-01-05 | Disposition: A | Discharge status: Home / Self Care | Payer: PPO | Source: Ambulatory Visit | Attending: Anesthesiology | Admitting: Anesthesiology

## 2014-01-05 ENCOUNTER — Other Ambulatory Visit: Payer: Self-pay | Admitting: Anesthesiology

## 2014-01-05 DIAGNOSIS — M25551 Pain in right hip: Secondary | ICD-10-CM

## 2014-01-30 ENCOUNTER — Other Ambulatory Visit: Payer: Self-pay | Admitting: Family Medicine

## 2014-01-30 NOTE — Telephone Encounter (Signed)
Is this okay to refill? 

## 2014-02-02 ENCOUNTER — Telehealth: Payer: Self-pay | Admitting: Family Medicine

## 2014-02-02 MED ORDER — FLUTICASONE-SALMETEROL 250-50 MCG/DOSE IN AEPB: 1.0000 | INHALATION_SPRAY | Freq: Two times a day (BID) | RESPIRATORY_TRACT | Status: DC

## 2014-02-02 NOTE — Telephone Encounter (Signed)
done

## 2014-02-02 NOTE — Telephone Encounter (Signed)
Rcvd new RX request for Advair [id number] at [facility] At [address] [place]

## 2014-03-18 ENCOUNTER — Other Ambulatory Visit: Payer: Self-pay | Admitting: Cardiovascular Disease

## 2014-03-18 MED ORDER — ROSUVASTATIN CALCIUM 10 MG PO TABS: 10.0000 mg | ORAL_TABLET | Freq: Every day | ORAL | Status: DC

## 2014-03-18 MED ORDER — METOPROLOL TARTRATE 25 MG PO TABS: 25.0000 mg | ORAL_TABLET | Freq: Two times a day (BID) | ORAL | Status: DC

## 2014-03-18 NOTE — Telephone Encounter (Signed)
Rx(s) sent to pharmacy electronically. Patient notified. 

## 2014-03-18 NOTE — Telephone Encounter (Signed)
°  1. Which medications need to be refilled? Crestor and Metoprolol  2. Which pharmacy is medication to be sent to?[facility] [phone]  3. Do they need a 30 day or 90 day supply? 30 and refills for a year  4. Would they like a call back once the medication has been sent to the pharmacy? yes

## 2014-04-15 ENCOUNTER — Ambulatory Visit: Payer: PPO | Admitting: Cardiology

## 2014-04-23 ENCOUNTER — Other Ambulatory Visit: Payer: Self-pay | Admitting: Anesthesiology

## 2014-04-23 DIAGNOSIS — M5416 Radiculopathy, lumbar region: Secondary | ICD-10-CM

## 2014-05-03 ENCOUNTER — Ambulatory Visit
Admission: RE | Admit: 2014-05-03 | Discharge: 2014-05-03 | Disposition: A | Discharge status: Home / Self Care | Payer: PPO | Source: Ambulatory Visit | Attending: Anesthesiology | Admitting: Anesthesiology

## 2014-05-03 DIAGNOSIS — M5416 Radiculopathy, lumbar region: Secondary | ICD-10-CM

## 2014-05-04 ENCOUNTER — Telehealth: Payer: Self-pay | Admitting: Internal Medicine

## 2014-05-04 DIAGNOSIS — J45909 Unspecified asthma, uncomplicated: Secondary | ICD-10-CM

## 2014-05-04 MED ORDER — MONTELUKAST SODIUM 10 MG PO TABS: ORAL_TABLET | ORAL | Status: DC

## 2014-05-04 NOTE — Telephone Encounter (Signed)
Pt needs refill on singular 10mg  to [facility]

## 2014-05-11 ENCOUNTER — Encounter: Payer: Self-pay | Admitting: Cardiology

## 2014-05-11 ENCOUNTER — Ambulatory Visit (INDEPENDENT_AMBULATORY_CARE_PROVIDER_SITE_OTHER): Payer: PPO | Admitting: Cardiology

## 2014-05-11 VITALS — BP 150/84 | HR 61 | Ht 64.5 in | Wt 176.4 lb

## 2014-05-11 DIAGNOSIS — I25810 Atherosclerosis of coronary artery bypass graft(s) without angina pectoris: Secondary | ICD-10-CM

## 2014-05-11 LAB — LIPID PANEL
CHOL/HDL RATIO: 1.7 ratio
CHOLESTEROL: 166 mg/dL (ref 0–200)
HDL: 100 mg/dL (ref >=46)
LDL CALC: 55 mg/dL (ref 0–99)
TRIGLYCERIDES: 56 mg/dL (ref <150)
VLDL: 11 mg/dL (ref 0–40)

## 2014-05-11 LAB — COMPREHENSIVE METABOLIC PANEL
ALT: 17 U/L (ref 0–35)
AST: 23 U/L (ref 0–37)
Albumin: 4.2 g/dL (ref 3.5–5.2)
Alkaline Phosphatase: 79 U/L (ref 39–117)
BILIRUBIN TOTAL: 0.3 mg/dL (ref 0.2–1.2)
BUN: 23 mg/dL (ref 6–23)
CO2: 31 meq/L (ref 19–32)
CREATININE: 0.95 mg/dL (ref 0.50–1.10)
Calcium: 9.4 mg/dL (ref 8.4–10.5)
Chloride: 105 mEq/L (ref 96–112)
Glucose, Bld: 99 mg/dL (ref 70–99)
POTASSIUM: 4.8 meq/L (ref 3.5–5.3)
SODIUM: 143 meq/L (ref 135–145)
TOTAL PROTEIN: 7 g/dL (ref 6.0–8.3)

## 2014-05-11 NOTE — Patient Instructions (Signed)
Your physician wants you to follow-up in: [place] will receive a reminder letter in the mail two months in advance. If you don't receive a letter, please call our office to schedule the follow-up appointment.   Your physician recommends that you HAVE LAB WORK TODAY

## 2014-05-11 NOTE — Progress Notes (Signed)
Cardiology Office Note   Date:  05/11/2014   ID:  [name], DOB February 10, 1940, MRN [id number]  PCP:  [name], MD  Cardiologist:  Dr. Gwenlyn Found    Chief Complaint  Patient presents with  . Shortness of Breath    ? RELATED TO ALLERGIES AND ASTHMA      History of Present Illness: [name] Long is a 74 y.o. female who presents for CAD and SOB.  She has a history of being admitted January 12, 2011, with chest pain and was catheterized, revealing a left main 3-vessel disease with an EF of 50% and high anterior hypokinesia, for which she underwent coronary artery bypass grafting by Dr. Tharon Aquas Trigt with a LIMA to her LAD, vein to an obtuse marginal branch and to the right coronary artery. Postop course was uncomplicated. A Myoview in April was normal. Her lipid profile was excellent. She saw Cecilie Kicks in our office on July 17, 2011  complaining of chest pain, and the concern was musculoskeletal versus pericarditis. A 2D echocardiogram was entirely normal. She ended up seeing Dr. Prescott Gum, who diagnosed a staph sternal wound infection, requiring hospitalization and debridement. and a wound VAC on and fortunately has completely healed.    Today complains of SOB with wheezes. Has been increased with heavy pollen year.  No chest pain.  Has not had lipids done in 1 year.  Not much exercise due to back and hip pain.      Past Medical History  Diagnosis Date  . Allergy     RHINITIS  . Asthma   . Fibromyalgia   . Fibromyalgia   . Chronic low back pain   . Osteoporosis     OSTEOPENIA  . PPD positive   . Connective tissue disease   . Urinary incontinence   . Hypertension   . Crescendo angina, with EKG changes, new. 01/12/2011  . Abnormal EKG 01/12/2011  . Back pain, chronic--plans were for cervical disc surgery week of 01/16/11 01/12/2011  . Asthma 01/12/2011  . Coronary artery disease   . Status post coronary artery bypass grafting   . Arthritis   . Blood transfusion without  reported diagnosis 2004  . Myocardial infarction 2013    triple heart bypass  . Personal history of colonic polyp - adenoma 08/05/2013    08/2013 - 7 mm adenoma - consider repeat colonoscopy 2020 (age 50)  . History of stress test 04/2011    Normal stress test  . Hx of echocardiogram 07/19/2011    EF 50-55%. There is mild annular calcification, there is trace migtral regurgitation, there is trace tricupid regurgitation, pericardium is thick, there is no pericardial effusion.    Past Surgical History  Procedure Laterality Date  . Appendectomy  1946  . Abdominal hysterectomy  1981  . Bladder repair  1985  . Cervical fusion   1992, 1998, 1999  . Breast surgery  1992    REDUCTION  . Carpal tunnel release Bilateral 1988, 1999  . Coronary artery bypass graft  01/14/2011    Procedure: CORONARY ARTERY BYPASS GRAFTING (CABG);  Surgeon: Tharon Aquas Adelene Idler, MD;  Location: [facility];  Service: Open Heart Surgery;  Laterality: N/A;  CABG x three, using right greater saphenous vein harvested endoscopically  . Sternal wound debridement  07/27/2011    Procedure: STERNAL WOUND DEBRIDEMENT;  Surgeon: Ivin Poot, MD;  Location: [facility];  Service: Open Heart Surgery;  Laterality: N/A;  . Pubovaginal sling  2002  .  Trigger finger release Right 2014    4th finger  . Tonsillectomy  1952  . Left heart catheterization with coronary angiogram N/A 01/13/2011    Procedure: LEFT HEART CATHETERIZATION WITH CORONARY ANGIOGRAM;  Surgeon: Troy Sine, MD;  Location: [facility] CATH LAB;  Service: Cardiovascular;  Laterality: N/A;     Current Outpatient Prescriptions  Medication Sig Dispense Refill  . aspirin 81 MG tablet Take 81 mg by mouth daily.    . Calcium Carbonate-Vit D-Min (CALCIUM 1200 PO) Take 1 tablet by mouth daily.    . carisoprodol (SOMA) 350 MG tablet Take 350 mg by mouth daily as needed.    . Fluticasone-Salmeterol (ADVAIR) 250-50 MCG/DOSE AEPB Inhale 1 puff into the lungs every 12 (twelve) hours. 60 each 0   . gabapentin (NEURONTIN) 600 MG tablet Take 600 mg by mouth 6 (six) times daily.     Marland Kitchen HYDROmorphone (DILAUDID) 4 MG tablet Take 4 mg by mouth daily as needed. For pain    . hydroxychloroquine (PLAQUENIL) 200 MG tablet Take 200 mg by mouth every evening.     . metoprolol tartrate (LOPRESSOR) 25 MG tablet Take 1 tablet (25 mg total) by mouth 2 (two) times daily. 180 tablet 2  . montelukast (SINGULAIR) 10 MG tablet TAKE 1 TABLET ONCE A DAY 90 tablet 3  . Multiple Vitamin (MULITIVITAMIN WITH MINERALS) TABS Take 1 tablet by mouth daily.    [name] Long Glycol-Propyl Glycol (SYSTANE OP) Apply to eye as needed.    Marland Kitchen PROVENTIL HFA 108 (90 BASE) MCG/ACT inhaler INHALE 2 PUFF AS DIRECTED FOUR TIMES A DAY AS NEEDED 6.7 g 1  . rosuvastatin (CRESTOR) 10 MG tablet Take 1 tablet (10 mg total) by mouth at bedtime. 90 tablet 2   No current facility-administered medications for this visit.    Allergies:   Review of patient's allergies indicates no known allergies.    Social History:  The patient  reports that she quit smoking about 34 years ago. She has never used smokeless tobacco. She reports that she does not drink alcohol or use illicit drugs.   Family History:  The patient's family history includes Hypertension in her brother. She was adopted.    ROS:  General:no colds or fevers, no weight changes, + allergies Skin:no rashes or ulcers HEENT:no blurred vision, no congestion, + hoarseness  CV:see HPI PUL:see HPI GI:no diarrhea constipation or melena, no indigestion GU:no hematuria, no dysuria MS:no joint pain, no claudication, back pain to hip and knees Neuro:no syncope, no lightheadedness Endo:no diabetes, no thyroid disease  Wt Readings from Last 3 Encounters:  05/11/14 176 lb 6.4 oz (80.015 kg)  10/30/13 175 lb (79.379 kg)  10/07/13 173 lb 4.8 oz (78.608 kg)     PHYSICAL EXAM: VS:  BP 150/84 mmHg  Pulse 61  Ht 5' 4.5" (1.638 m)  Wt 176 lb 6.4 oz (80.015 kg)  BMI 29.82 kg/m2 , BMI  Body mass index is 29.82 kg/(m^2).   Recheck BP 140/80   General:Pleasant affect, NAD Skin:Warm and dry, brisk capillary refill HEENT:normocephalic, sclera clear, mucus membranes moist Neck:supple, no JVD, no bruits  Heart:S1S2 RRR without murmur, gallup, rub or click Lungs: without rales, rhonchi, + wheezes QBH:ALPF, non tender, + BS, do not palpate liver spleen or masses Ext:no lower ext edema, 2+ pedal pulses, 2+ radial pulses Neuro:alert and oriented X 3, MAE, follows commands, + facial symmetry    EKG:  EKG is ordered today. The ekg ordered today demonstrates SR incomplete RBBB, T wave  inversion II,III,AVF,V1-6 old.     Recent Labs: No results found for requested labs within last 365 days.    Lipid Panel    Component Value Date/Time   CHOL 152 03/11/2013 1044   TRIG 47 03/11/2013 1044   HDL 82 03/11/2013 1044   CHOLHDL 1.9 03/11/2013 1044   VLDL 9 03/11/2013 1044   LDLCALC 61 03/11/2013 1044       Other studies Reviewed: Additional studies/ records that were reviewed today include: previous echo and OV. .   ASSESSMENT AND PLAN:  S/P CABG x 3: (lima-lad,svg-om,svg-rca) History of CAD status post coronary bypass grafting by Dr. Tharon Aquas Trigt January 2013 with a LIMA to her LAD, vein to obtuse marginal branch and to the right coronary artery. A Myoview performed April 2014 with nonischemic. She denies chest pain, + shortness of breath due to asthma.  She will follow up with Dr. Adora Fridge in 6 months unless problems prior to that time.  Hyperlipidemia On statin therapy with her most recent lipid profile performed 03/11/13 revealing a total cholesterol of 152, LDL 61 and HDL of 82. Will recheck CMP and Lipids today, she is NPO.   HTN:  Continue current meds.  monitor BP is stays elevated she will contact us.     Current medicines are reviewed with the patient today.  The patient Has no concerns regarding medicines.  The following changes have been made:  See  above Labs/ tests ordered today include:see above  Disposition:   FU:  see above  Signed, Isaiah Serge, NP  05/11/2014 8:11 AM    [facility] [address], [place] Phone: [phone]; Fax: [phone]

## 2014-09-28 ENCOUNTER — Telehealth: Payer: Self-pay | Admitting: Cardiovascular Disease

## 2014-09-28 MED ORDER — ATORVASTATIN CALCIUM 20 MG PO TABS
20.0000 mg | ORAL_TABLET | Freq: Every day | ORAL | Status: DC
Start: 2014-09-28 — End: 2015-07-09

## 2014-09-28 NOTE — Telephone Encounter (Signed)
Med change discussed w/ patient, prescription sent to her preferred pharmacy for #90 w/ refills. Pt voiced thanks and verb'd understanding of instructions.

## 2014-09-28 NOTE — Telephone Encounter (Signed)
Pt would like another medicine in the place of the Crestor,insurance will no longer pay for Crestor and the generic Crestor is very expensive. Please call  tonew medicine in to [facility]-[phone].If not possible to change it,please let her know.

## 2014-09-28 NOTE — Telephone Encounter (Signed)
Pt requesting alternative to Crestor, unable to afford this including the generic version.  Routing to [place] to advise.

## 2014-09-28 NOTE — Telephone Encounter (Signed)
Switch to atorvastatin 20 mg once daily.

## 2014-09-29 ENCOUNTER — Ambulatory Visit (INDEPENDENT_AMBULATORY_CARE_PROVIDER_SITE_OTHER): Payer: PPO | Admitting: Ophthalmology

## 2014-09-29 DIAGNOSIS — H2513 Age-related nuclear cataract, bilateral: Secondary | ICD-10-CM

## 2014-09-29 DIAGNOSIS — M0609 Rheumatoid arthritis without rheumatoid factor, multiple sites: Secondary | ICD-10-CM | POA: Diagnosis not present

## 2014-09-29 DIAGNOSIS — H43813 Vitreous degeneration, bilateral: Secondary | ICD-10-CM | POA: Diagnosis not present

## 2014-09-29 DIAGNOSIS — H3561 Retinal hemorrhage, right eye: Secondary | ICD-10-CM

## 2014-11-17 ENCOUNTER — Encounter: Payer: Self-pay | Admitting: Cardiovascular Disease

## 2014-11-30 ENCOUNTER — Encounter (INDEPENDENT_AMBULATORY_CARE_PROVIDER_SITE_OTHER): Payer: PPO | Admitting: Ophthalmology

## 2014-11-30 DIAGNOSIS — H3561 Retinal hemorrhage, right eye: Secondary | ICD-10-CM

## 2014-11-30 DIAGNOSIS — H43813 Vitreous degeneration, bilateral: Secondary | ICD-10-CM | POA: Diagnosis not present

## 2014-12-08 ENCOUNTER — Ambulatory Visit: Payer: PPO | Admitting: Cardiovascular Disease

## 2014-12-09 ENCOUNTER — Ambulatory Visit (INDEPENDENT_AMBULATORY_CARE_PROVIDER_SITE_OTHER): Payer: PPO | Admitting: Cardiovascular Disease

## 2014-12-09 ENCOUNTER — Encounter: Payer: Self-pay | Admitting: Cardiovascular Disease

## 2014-12-09 VITALS — BP 136/76 | HR 71 | Ht 64.0 in | Wt 174.0 lb

## 2014-12-09 DIAGNOSIS — Z951 Presence of aortocoronary bypass graft: Secondary | ICD-10-CM

## 2014-12-09 DIAGNOSIS — E785 Hyperlipidemia, unspecified: Secondary | ICD-10-CM

## 2014-12-09 NOTE — Progress Notes (Signed)
12/09/2014 [name] Long   1940-08-30  [id number]  Primary Physician [name], MD Primary Cardiologist: Lorretta Harp MD Renae Gloss   HPI:  The patient is a very pleasant 74 year old mildly overweight married Caucasian female, mother of 2, grandmother to 6 grandchildren, who I last saw in the office 14 months ago. She ended up being admitted January 12, 2011, with chest pain and was catheterized, revealing a left main 3-vessel disease with an EF of 50% and high anterior hypokinesia, for which she underwent coronary artery bypass grafting by Dr. Tharon Aquas Trigt with a LIMA to her LAD, vein to an obtuse marginal branch and to the right coronary artery. Postop course was uncomplicated. A Myoview in April was normal. Her lipid profile was excellent. She saw Cecilie Kicks in our office on July 15 complaining of chest pain, and the concern was musculoskeletal versus pericarditis. A 2D echocardiogram was entirely normal. She ended up seeing Dr. Prescott Gum, who diagnosed a staph sternal wound infection, requiring hospitalization and debridement. She had a wound VAC on and fortunately has completely healed. She saw Cecilie Kicks in the office 6 months ago and has been doing well since that time   Current Outpatient Prescriptions  Medication Sig Dispense Refill  . aspirin 81 MG tablet Take 81 mg by mouth daily.    Marland Kitchen atorvastatin (LIPITOR) 20 MG tablet Take 1 tablet (20 mg total) by mouth daily. 90 tablet 3  . Calcium Carbonate-Vit D-Min (CALCIUM 1200 PO) Take 1 tablet by mouth daily.    . carisoprodol (SOMA) 350 MG tablet Take 350 mg by mouth daily as needed.    . Fluticasone-Salmeterol (ADVAIR) 250-50 MCG/DOSE AEPB Inhale 1 puff into the lungs every 12 (twelve) hours. 60 each 0  . gabapentin (NEURONTIN) 600 MG tablet Take 600 mg by mouth 6 (six) times daily.     Marland Kitchen HYDROmorphone (DILAUDID) 4 MG tablet Take 4 mg by mouth daily as needed. For pain    . hydroxychloroquine  (PLAQUENIL) 200 MG tablet Take 200 mg by mouth every evening.     . metoprolol tartrate (LOPRESSOR) 25 MG tablet Take 1 tablet (25 mg total) by mouth 2 (two) times daily. 180 tablet 2  . montelukast (SINGULAIR) 10 MG tablet TAKE 1 TABLET ONCE A DAY 90 tablet 3  . Multiple Vitamin (MULITIVITAMIN WITH MINERALS) TABS Take 1 tablet by mouth daily.    Vladimir Faster Glycol-Propyl Glycol (SYSTANE OP) Apply to eye as needed.    Marland Kitchen PROVENTIL HFA 108 (90 BASE) MCG/ACT inhaler INHALE 2 PUFF AS DIRECTED FOUR TIMES A DAY AS NEEDED 6.7 g 1   No current facility-administered medications for this visit.    No Known Allergies  Social History   Social History  . Marital Status: Married    Spouse Name: N/A  . Number of Children: N/A  . Years of Education: N/A   Occupational History  . Not on file.   Social History Main Topics  . Smoking status: Former Smoker    Quit date: 01/12/1980  . Smokeless tobacco: Never Used  . Alcohol Use: No  . Drug Use: No  . Sexual Activity: Yes   Other Topics Concern  . Not on file   Social History Narrative     Review of Systems: General: negative for chills, fever, night sweats or weight changes.  Cardiovascular: negative for chest pain, dyspnea on exertion, edema, orthopnea, palpitations, paroxysmal nocturnal dyspnea or shortness of breath Dermatological: negative for rash  Respiratory: negative for cough or wheezing Urologic: negative for hematuria Abdominal: negative for nausea, vomiting, diarrhea, bright red blood per rectum, melena, or hematemesis Neurologic: negative for visual changes, syncope, or dizziness All other systems reviewed and are otherwise negative except as noted above.    Blood pressure 136/76, pulse 71, height 5\' 4"  (1.626 m), weight 174 lb (78.926 kg).  General appearance: alert and no distress Neck: no adenopathy, no carotid bruit, no JVD, supple, symmetrical, trachea midline and thyroid not enlarged, symmetric, no  tenderness/mass/nodules Lungs: clear to auscultation bilaterally Heart: regular rate and rhythm, S1, S2 normal, no murmur, click, rub or gallop Extremities: extremities normal, atraumatic, no cyanosis or edema  EKG not performed today  ASSESSMENT AND PLAN:   S/P CABG x 3: (lima-lad,svg-om,svg-rca) History of coronary artery disease status post bypass grafting by Dr. Tharon Aquas Trigt January 2013 with a LIMA to LAD, vein to obtuse marginal branch and to the right coronary artery. Her postoperative course was uncomplicated. She ended up having a sternal wound infection requiring rehospitalization, debridement and wound VAC therapy which ultimately healed.  Hyperlipidemia History of hyperlipidemia or atorvastatin 20 mg a day with lipid profile performed 05/11/14 revealed a total cholesterol 166, LDL 55 and HDL of 100.      Lorretta Harp MD FACP,FACC,FAHA, [facility] 12/09/2014 11:40 AM

## 2014-12-09 NOTE — Assessment & Plan Note (Signed)
History of hyperlipidemia or atorvastatin 20 mg a day with lipid profile performed 05/11/14 revealed a total cholesterol 166, LDL 55 and HDL of 100.

## 2014-12-09 NOTE — Patient Instructions (Signed)

## 2014-12-09 NOTE — Assessment & Plan Note (Signed)
History of coronary artery disease status post bypass grafting by Dr. Tharon Aquas Trigt January 2013 with a LIMA to LAD, vein to obtuse marginal branch and to the right coronary artery. Her postoperative course was uncomplicated. She ended up having a sternal wound infection requiring rehospitalization, debridement and wound VAC therapy which ultimately healed.

## 2014-12-10 ENCOUNTER — Other Ambulatory Visit: Payer: Self-pay | Admitting: Cardiovascular Disease

## 2014-12-10 NOTE — Telephone Encounter (Signed)
Rx request sent to pharmacy.  

## 2014-12-15 ENCOUNTER — Ambulatory Visit: Payer: PPO | Admitting: Cardiovascular Disease

## 2015-02-01 DIAGNOSIS — G894 Chronic pain syndrome: Secondary | ICD-10-CM | POA: Diagnosis not present

## 2015-02-01 DIAGNOSIS — M150 Primary generalized (osteo)arthritis: Secondary | ICD-10-CM | POA: Diagnosis not present

## 2015-02-01 DIAGNOSIS — M47812 Spondylosis without myelopathy or radiculopathy, cervical region: Secondary | ICD-10-CM | POA: Diagnosis not present

## 2015-02-01 DIAGNOSIS — Z79891 Long term (current) use of opiate analgesic: Secondary | ICD-10-CM | POA: Diagnosis not present

## 2015-02-04 DIAGNOSIS — L218 Other seborrheic dermatitis: Secondary | ICD-10-CM | POA: Diagnosis not present

## 2015-03-02 ENCOUNTER — Encounter (INDEPENDENT_AMBULATORY_CARE_PROVIDER_SITE_OTHER): Payer: PPO | Admitting: Ophthalmology

## 2015-03-02 DIAGNOSIS — H43813 Vitreous degeneration, bilateral: Secondary | ICD-10-CM

## 2015-03-02 DIAGNOSIS — H35033 Hypertensive retinopathy, bilateral: Secondary | ICD-10-CM | POA: Diagnosis not present

## 2015-03-02 DIAGNOSIS — I10 Essential (primary) hypertension: Secondary | ICD-10-CM | POA: Diagnosis not present

## 2015-03-02 DIAGNOSIS — H3561 Retinal hemorrhage, right eye: Secondary | ICD-10-CM

## 2015-03-02 DIAGNOSIS — H2513 Age-related nuclear cataract, bilateral: Secondary | ICD-10-CM | POA: Diagnosis not present

## 2015-03-03 DIAGNOSIS — L218 Other seborrheic dermatitis: Secondary | ICD-10-CM | POA: Diagnosis not present

## 2015-03-03 DIAGNOSIS — L700 Acne vulgaris: Secondary | ICD-10-CM | POA: Diagnosis not present

## 2015-04-08 DIAGNOSIS — Z23 Encounter for immunization: Secondary | ICD-10-CM | POA: Diagnosis not present

## 2015-04-08 DIAGNOSIS — M5030 Other cervical disc degeneration, unspecified cervical region: Secondary | ICD-10-CM | POA: Diagnosis not present

## 2015-04-08 DIAGNOSIS — M5136 Other intervertebral disc degeneration, lumbar region: Secondary | ICD-10-CM | POA: Diagnosis not present

## 2015-04-08 DIAGNOSIS — M7521 Bicipital tendinitis, right shoulder: Secondary | ICD-10-CM | POA: Diagnosis not present

## 2015-04-08 DIAGNOSIS — M797 Fibromyalgia: Secondary | ICD-10-CM | POA: Diagnosis not present

## 2015-04-08 DIAGNOSIS — M150 Primary generalized (osteo)arthritis: Secondary | ICD-10-CM | POA: Diagnosis not present

## 2015-04-08 DIAGNOSIS — M359 Systemic involvement of connective tissue, unspecified: Secondary | ICD-10-CM | POA: Diagnosis not present

## 2015-05-05 DIAGNOSIS — Z79891 Long term (current) use of opiate analgesic: Secondary | ICD-10-CM | POA: Diagnosis not present

## 2015-05-05 DIAGNOSIS — M47812 Spondylosis without myelopathy or radiculopathy, cervical region: Secondary | ICD-10-CM | POA: Diagnosis not present

## 2015-05-05 DIAGNOSIS — M150 Primary generalized (osteo)arthritis: Secondary | ICD-10-CM | POA: Diagnosis not present

## 2015-05-05 DIAGNOSIS — G894 Chronic pain syndrome: Secondary | ICD-10-CM | POA: Diagnosis not present

## 2015-06-11 ENCOUNTER — Other Ambulatory Visit: Payer: Self-pay | Admitting: Family Medicine

## 2015-06-30 ENCOUNTER — Ambulatory Visit (INDEPENDENT_AMBULATORY_CARE_PROVIDER_SITE_OTHER): Payer: PPO | Admitting: Ophthalmology

## 2015-06-30 ENCOUNTER — Encounter: Payer: Self-pay | Admitting: Family Medicine

## 2015-06-30 DIAGNOSIS — I10 Essential (primary) hypertension: Secondary | ICD-10-CM | POA: Diagnosis not present

## 2015-06-30 DIAGNOSIS — H43813 Vitreous degeneration, bilateral: Secondary | ICD-10-CM

## 2015-06-30 DIAGNOSIS — H35033 Hypertensive retinopathy, bilateral: Secondary | ICD-10-CM | POA: Diagnosis not present

## 2015-06-30 DIAGNOSIS — H3561 Retinal hemorrhage, right eye: Secondary | ICD-10-CM

## 2015-07-09 ENCOUNTER — Encounter: Payer: Self-pay | Admitting: Family Medicine

## 2015-07-09 ENCOUNTER — Ambulatory Visit (INDEPENDENT_AMBULATORY_CARE_PROVIDER_SITE_OTHER): Payer: PPO | Admitting: Family Medicine

## 2015-07-09 VITALS — BP 110/68 | HR 66 | Ht 64.0 in | Wt 176.0 lb

## 2015-07-09 DIAGNOSIS — M797 Fibromyalgia: Secondary | ICD-10-CM | POA: Diagnosis not present

## 2015-07-09 DIAGNOSIS — G8929 Other chronic pain: Secondary | ICD-10-CM

## 2015-07-09 DIAGNOSIS — M549 Dorsalgia, unspecified: Secondary | ICD-10-CM | POA: Diagnosis not present

## 2015-07-09 DIAGNOSIS — E785 Hyperlipidemia, unspecified: Secondary | ICD-10-CM | POA: Diagnosis not present

## 2015-07-09 DIAGNOSIS — M069 Rheumatoid arthritis, unspecified: Secondary | ICD-10-CM

## 2015-07-09 DIAGNOSIS — Z951 Presence of aortocoronary bypass graft: Secondary | ICD-10-CM | POA: Diagnosis not present

## 2015-07-09 DIAGNOSIS — H9311 Tinnitus, right ear: Secondary | ICD-10-CM

## 2015-07-09 DIAGNOSIS — H269 Unspecified cataract: Secondary | ICD-10-CM

## 2015-07-09 DIAGNOSIS — J4530 Mild persistent asthma, uncomplicated: Secondary | ICD-10-CM | POA: Diagnosis not present

## 2015-07-09 DIAGNOSIS — M8580 Other specified disorders of bone density and structure, unspecified site: Secondary | ICD-10-CM | POA: Diagnosis not present

## 2015-07-09 DIAGNOSIS — E559 Vitamin D deficiency, unspecified: Secondary | ICD-10-CM | POA: Diagnosis not present

## 2015-07-09 DIAGNOSIS — K219 Gastro-esophageal reflux disease without esophagitis: Secondary | ICD-10-CM | POA: Diagnosis not present

## 2015-07-09 LAB — CBC WITH DIFFERENTIAL/PLATELET
BASOS ABS: 94 {cells}/uL (ref 0–200)
Basophils Relative: 2 %
Eosinophils Absolute: 188 cells/uL (ref 15–500)
Eosinophils Relative: 4 %
HCT: 42.1 % (ref 35.0–45.0)
HEMOGLOBIN: 13.9 g/dL (ref 11.7–15.5)
Lymphocytes Relative: 34 %
Lymphs Abs: 1598 cells/uL (ref 850–3900)
MCH: 29.6 pg (ref 27.0–33.0)
MCHC: 33 g/dL (ref 32.0–36.0)
MCV: 89.8 fL (ref 80.0–100.0)
MPV: 9.3 fL (ref 7.5–12.5)
Monocytes Absolute: 376 cells/uL (ref 200–950)
Monocytes Relative: 8 %
NEUTROS ABS: 2444 {cells}/uL (ref 1500–7800)
NEUTROS PCT: 52 %
Platelets: 191 10*3/uL (ref 140–400)
RBC: 4.69 MIL/uL (ref 3.80–5.10)
RDW: 13.7 % (ref 11.0–15.0)
WBC: 4.7 10*3/uL (ref 4.0–10.5)

## 2015-07-09 MED ORDER — ATORVASTATIN CALCIUM 20 MG PO TABS
20.0000 mg | ORAL_TABLET | Freq: Every day | ORAL | Status: DC
Start: 2015-07-09 — End: 2016-07-14

## 2015-07-09 MED ORDER — FLUTICASONE-SALMETEROL 250-50 MCG/DOSE IN AEPB: 1.0000 | INHALATION_SPRAY | Freq: Two times a day (BID) | RESPIRATORY_TRACT | Status: DC

## 2015-07-09 MED ORDER — ALBUTEROL SULFATE HFA 108 (90 BASE) MCG/ACT IN AERS: INHALATION_SPRAY | RESPIRATORY_TRACT | Status: DC

## 2015-07-09 MED ORDER — MONTELUKAST SODIUM 10 MG PO TABS: ORAL_TABLET | ORAL | Status: DC

## 2015-07-09 NOTE — Progress Notes (Signed)
   Subjective:    Patient ID: [name] Long, female    DOB: 03-06-1940, 75 y.o.   MRN: [id number]  HPI She is here for an interval evaluation. She fell approximately 2 months ago landing on her left SI joint. Since that time she has had continued pain especially with motion of her back and occasional radiation. She has a previous history of chronic back pain and is on muscle relaxer as well as occasional Dilaudid. She sees Dr. Hardin Negus for this. She has underlying heart disease and is followed by Dr. Gwenlyn Found. He saw her several months ago and at that time she was doing quite well. She does complain of several episodes lasting 15 seconds of chest tightness but no other symptoms. These were not related to physical activity and presently she is not having any trouble with that. She does have underlying reflux disease. She also sees her ophthalmologist and does have cataracts but presently is not in need of any surgery. She does have tinnitus and is using hearing aids to help with this. She is followed at [facility] for her fibromyalgia as well as rheumatoid arthritis. She continues on plaquinil for this. She apparently did receive Prevnar 13 at [facility]. Continues on her blood pressure medication as well as statin. Her asthma seems to be under good control with the use of Advair and occasional need of Proventil. She has had a previous DEXA scan which did show low bone mass.   Review of Systems  All other systems reviewed and are negative.      Objective:   Physical Exam Alert and in no distress. Tympanic membranes and canals are normal. Pharyngeal area is normal. Neck is supple without adenopathy or thyromegaly. Cardiac exam shows a regular sinus rhythm without murmurs or gallops. Lungs are clear to auscultation.Back exam shows exquisite tenderness to palpation over the left SI joint.        Assessment & Plan:  Back pain, chronic--plans were for cervical disc surgery week of 01/16/11  Hyperlipidemia - Plan:  Lipid panel, atorvastatin (LIPITOR) 20 MG tablet  Gastroesophageal reflux disease without esophagitis - Plan: CBC with Differential/Platelet, Comprehensive metabolic panel  Fibromyalgia  Rheumatoid arthritis involving both hips, unspecified rheumatoid factor presence ([place]) - Plan: CBC with Differential/Platelet, Comprehensive metabolic panel  Tinnitus, right  Asthma, chronic, mild persistent, uncomplicated - Plan: albuterol (PROVENTIL HFA) 108 (90 Base) MCG/ACT inhaler, montelukast (SINGULAIR) 10 MG tablet, Fluticasone-Salmeterol (ADVAIR) 250-50 MCG/DOSE AEPB  S/P CABG x 3: (lima-lad,svg-om,svg-rca) - Plan: CBC with Differential/Platelet, Comprehensive metabolic panel, Lipid panel  Cataracts, bilateral  Osteopenia - Plan: VITAMIN D 25 Hydroxy (Vit-D Deficiency, Fractures) She will continue on her present medications. She will set up to get a mammogram and DEXA scan done. We will document the Prevnar. Discussed cataracts and encouraged her to not have surgery until she has more difficulty with that. Recommend heat and to leave twice per day to help with her back for with continued difficulty, recommend she talk to Dr. Hardin Negus about possible injection. I will check a vitamin D level since she does have osteopenia. She will continue to be followed by Dr. Gwenlyn Found and at [facility]. Continue on her asthma medications.

## 2015-07-09 NOTE — Patient Instructions (Signed)
Heat for 20 minutes 3 times per day and 2 Aleve twice per day. You can use Dilaudid and Soma if you need to. If he keeps bothering you talk to Dr. Gerarda Fraction about possibly injecting into the SI joint

## 2015-07-10 LAB — LIPID PANEL
CHOL/HDL RATIO: 1.6 ratio (ref <=5.0)
Cholesterol: 176 mg/dL (ref 125–200)
HDL: 111 mg/dL (ref >=46)
LDL Cholesterol: 53 mg/dL (ref <130)
TRIGLYCERIDES: 60 mg/dL (ref <150)
VLDL: 12 mg/dL (ref <30)

## 2015-07-10 LAB — COMPREHENSIVE METABOLIC PANEL
ALBUMIN: 4.5 g/dL (ref 3.6–5.1)
ALT: 13 U/L (ref 6–29)
AST: 23 U/L (ref 10–35)
Alkaline Phosphatase: 84 U/L (ref 33–130)
BUN: 22 mg/dL (ref 7–25)
CHLORIDE: 106 mmol/L (ref 98–110)
CO2: 25 mmol/L (ref 20–31)
CREATININE: 1.06 mg/dL — AB (ref 0.60–0.93)
Calcium: 9.6 mg/dL (ref 8.6–10.4)
Glucose, Bld: 89 mg/dL (ref 65–99)
POTASSIUM: 5 mmol/L (ref 3.5–5.3)
Sodium: 143 mmol/L (ref 135–146)
Total Bilirubin: 0.5 mg/dL (ref 0.2–1.2)
Total Protein: 7.4 g/dL (ref 6.1–8.1)

## 2015-07-10 LAB — VITAMIN D 25 HYDROXY (VIT D DEFICIENCY, FRACTURES): Vit D, 25-Hydroxy: 29 ng/mL — ABNORMAL LOW (ref 30–100)

## 2015-07-30 DIAGNOSIS — M8589 Other specified disorders of bone density and structure, multiple sites: Secondary | ICD-10-CM | POA: Diagnosis not present

## 2015-07-30 DIAGNOSIS — Z1231 Encounter for screening mammogram for malignant neoplasm of breast: Secondary | ICD-10-CM | POA: Diagnosis not present

## 2015-07-30 LAB — HM DEXA SCAN

## 2015-07-30 LAB — HM MAMMOGRAPHY

## 2015-08-02 ENCOUNTER — Encounter: Payer: Self-pay | Admitting: Family Medicine

## 2015-08-03 ENCOUNTER — Encounter: Payer: Self-pay | Admitting: Family Medicine

## 2015-08-18 DIAGNOSIS — Z79891 Long term (current) use of opiate analgesic: Secondary | ICD-10-CM | POA: Diagnosis not present

## 2015-08-18 DIAGNOSIS — M150 Primary generalized (osteo)arthritis: Secondary | ICD-10-CM | POA: Diagnosis not present

## 2015-08-18 DIAGNOSIS — M47812 Spondylosis without myelopathy or radiculopathy, cervical region: Secondary | ICD-10-CM | POA: Diagnosis not present

## 2015-08-18 DIAGNOSIS — G894 Chronic pain syndrome: Secondary | ICD-10-CM | POA: Diagnosis not present

## 2015-11-01 ENCOUNTER — Ambulatory Visit (INDEPENDENT_AMBULATORY_CARE_PROVIDER_SITE_OTHER): Payer: PPO | Admitting: Ophthalmology

## 2015-11-01 DIAGNOSIS — H35033 Hypertensive retinopathy, bilateral: Secondary | ICD-10-CM | POA: Diagnosis not present

## 2015-11-01 DIAGNOSIS — H43813 Vitreous degeneration, bilateral: Secondary | ICD-10-CM | POA: Diagnosis not present

## 2015-11-01 DIAGNOSIS — M797 Fibromyalgia: Secondary | ICD-10-CM | POA: Diagnosis not present

## 2015-11-01 DIAGNOSIS — I10 Essential (primary) hypertension: Secondary | ICD-10-CM | POA: Diagnosis not present

## 2015-11-01 DIAGNOSIS — H2513 Age-related nuclear cataract, bilateral: Secondary | ICD-10-CM

## 2015-11-17 DIAGNOSIS — Z79891 Long term (current) use of opiate analgesic: Secondary | ICD-10-CM | POA: Diagnosis not present

## 2015-11-17 DIAGNOSIS — M47812 Spondylosis without myelopathy or radiculopathy, cervical region: Secondary | ICD-10-CM | POA: Diagnosis not present

## 2015-11-17 DIAGNOSIS — M150 Primary generalized (osteo)arthritis: Secondary | ICD-10-CM | POA: Diagnosis not present

## 2015-11-17 DIAGNOSIS — G894 Chronic pain syndrome: Secondary | ICD-10-CM | POA: Diagnosis not present

## 2015-11-29 ENCOUNTER — Encounter: Payer: Self-pay | Admitting: Family Medicine

## 2015-11-29 ENCOUNTER — Ambulatory Visit (INDEPENDENT_AMBULATORY_CARE_PROVIDER_SITE_OTHER): Payer: PPO | Admitting: Family Medicine

## 2015-11-29 ENCOUNTER — Ambulatory Visit
Admission: RE | Admit: 2015-11-29 | Discharge: 2015-11-29 | Disposition: A | Discharge status: Home / Self Care | Payer: PPO | Source: Ambulatory Visit | Attending: Family Medicine | Admitting: Family Medicine

## 2015-11-29 VITALS — BP 150/80 | HR 73 | Wt 179.0 lb

## 2015-11-29 DIAGNOSIS — I25810 Atherosclerosis of coronary artery bypass graft(s) without angina pectoris: Secondary | ICD-10-CM

## 2015-11-29 DIAGNOSIS — Z951 Presence of aortocoronary bypass graft: Secondary | ICD-10-CM

## 2015-11-29 DIAGNOSIS — R0609 Other forms of dyspnea: Secondary | ICD-10-CM

## 2015-11-29 DIAGNOSIS — R0602 Shortness of breath: Secondary | ICD-10-CM | POA: Diagnosis not present

## 2015-11-29 LAB — COMPREHENSIVE METABOLIC PANEL
ALBUMIN: 4.6 g/dL (ref 3.6–5.1)
ALK PHOS: 82 U/L (ref 33–130)
ALT: 14 U/L (ref 6–29)
AST: 25 U/L (ref 10–35)
BUN: 20 mg/dL (ref 7–25)
CO2: 28 mmol/L (ref 20–31)
Calcium: 9.3 mg/dL (ref 8.6–10.4)
Chloride: 106 mmol/L (ref 98–110)
Creat: 0.98 mg/dL — ABNORMAL HIGH (ref 0.60–0.93)
Glucose, Bld: 105 mg/dL — ABNORMAL HIGH (ref 65–99)
POTASSIUM: 4.3 mmol/L (ref 3.5–5.3)
Sodium: 140 mmol/L (ref 135–146)
TOTAL PROTEIN: 7.5 g/dL (ref 6.1–8.1)
Total Bilirubin: 0.4 mg/dL (ref 0.2–1.2)

## 2015-11-29 LAB — LIPID PANEL
CHOLESTEROL: 173 mg/dL (ref <200)
HDL: 103 mg/dL (ref >50)
LDL Cholesterol: 58 mg/dL (ref <100)
TRIGLYCERIDES: 59 mg/dL (ref <150)
Total CHOL/HDL Ratio: 1.7 Ratio (ref <5.0)
VLDL: 12 mg/dL (ref <30)

## 2015-11-29 LAB — CBC WITH DIFFERENTIAL/PLATELET
BASOS ABS: 0 {cells}/uL (ref 0–200)
BASOS PCT: 0 %
EOS ABS: 171 {cells}/uL (ref 15–500)
Eosinophils Relative: 3 %
HCT: 41.5 % (ref 35.0–45.0)
Hemoglobin: 13.7 g/dL (ref 11.7–15.5)
Lymphocytes Relative: 24 %
Lymphs Abs: 1368 cells/uL (ref 850–3900)
MCH: 29.3 pg (ref 27.0–33.0)
MCHC: 33 g/dL (ref 32.0–36.0)
MCV: 88.9 fL (ref 80.0–100.0)
MONOS PCT: 8 %
MPV: 9.6 fL (ref 7.5–12.5)
Monocytes Absolute: 456 cells/uL (ref 200–950)
Neutro Abs: 3705 cells/uL (ref 1500–7800)
Neutrophils Relative %: 65 %
PLATELETS: 211 10*3/uL (ref 140–400)
RBC: 4.67 MIL/uL (ref 3.80–5.10)
RDW: 14.1 % (ref 11.0–15.0)
WBC: 5.7 10*3/uL (ref 4.0–10.5)

## 2015-11-29 NOTE — Progress Notes (Signed)
   Subjective:    Patient ID: [name] Long, female    DOB: September 10, 1940, 75 y.o.   MRN: [id number]  HPI Approximately 3 weeks ago she experienced one episode of visual change or she noted her peripheral vision diminished and then came back very quickly. No associated heart rate changes, weakness, numbness or tingling. She has had no more episodes since then. She also complains of a several month history of shortness of breath requiring her to use her rescue inhaler. She does have an underlying history of asthma. She also gets short of breath going up a flight of stairs as well as some wheezing however this goes away in less than a minute. No complaint of peripheral edema She has also woken up in the middle of night short of breath. She is scheduled to see her cardiologist in 2 weeks. She does have an underlying history of CABG.   Review of Systems     Objective:   Physical Exam Alert and in no distress. Tympanic membranes and canals are normal. Pharyngeal area is normal. Neck is supple without adenopathy or thyromegaly. Cardiac exam shows a regular sinus rhythm without murmurs or gallops. Lungs are clear to auscultation. EKG shows no change from previous tracing Chest x-ray shows no acute changes      Assessment & Plan:  Dyspnea on exertion  Coronary artery disease involving coronary bypass graft of native heart without angina pectoris  S/P CABG x 3: (lima-lad,svg-om,svg-rca) She is describing DOE, PND and probably cardiac asthma. She has an appointment with cardiology and roughly 2 weeks. Will await on lab data and possibly get her seen sooner area

## 2015-11-30 LAB — BRAIN NATRIURETIC PEPTIDE: Brain Natriuretic Peptide: 41.4 pg/mL (ref <100)

## 2015-12-17 ENCOUNTER — Ambulatory Visit (INDEPENDENT_AMBULATORY_CARE_PROVIDER_SITE_OTHER): Payer: PPO | Admitting: Cardiovascular Disease

## 2015-12-17 ENCOUNTER — Encounter: Payer: Self-pay | Admitting: Cardiovascular Disease

## 2015-12-17 VITALS — BP 134/72 | HR 76 | Ht 64.0 in | Wt 179.0 lb

## 2015-12-17 DIAGNOSIS — R0602 Shortness of breath: Secondary | ICD-10-CM

## 2015-12-17 DIAGNOSIS — Z951 Presence of aortocoronary bypass graft: Secondary | ICD-10-CM | POA: Diagnosis not present

## 2015-12-17 DIAGNOSIS — E7800 Pure hypercholesterolemia, unspecified: Secondary | ICD-10-CM | POA: Diagnosis not present

## 2015-12-17 NOTE — Progress Notes (Signed)
12/17/2015 [name] Long   10-06-1940  [id number]  Primary Physician [name], MD Primary Cardiologist: Lorretta Harp MD Lupe Carney, [place]  HPI:   The patient is a very pleasant 75 year old mildly overweight married Caucasian female, mother of 2, grandmother to 6 grandchildren, who I last saw in the office 12/09/14. She ended up being admitted January 12, 2011, with chest pain and was catheterized, revealing a left main 3-vessel disease with an EF of 50% and high anterior hypokinesia, for which she underwent coronary artery bypass grafting by Dr. Tharon Aquas Trigt with a LIMA to her LAD, vein to an obtuse marginal branch and to the right coronary artery. Postop course was uncomplicated. A Myoview in April was normal. Her lipid profile was excellent. She saw Cecilie Kicks in our office on July 15 complaining of chest pain, and the concern was musculoskeletal versus pericarditis. A 2D echocardiogram was entirely normal. She ended up seeing Dr. Prescott Gum, who diagnosed a staph sternal wound infection, requiring hospitalization and debridement. She had a wound VAC on and fortunately has completely healed. Since I saw her in the office a year ago she started relatively well although she does relate increasing dyspnea on exertion and decreasing exercise tolerance test since this past summer which is unusual for her.   Current Outpatient Prescriptions  Medication Sig Dispense Refill  . albuterol (PROVENTIL HFA) 108 (90 Base) MCG/ACT inhaler INHALE 2 PUFF AS DIRECTED FOUR TIMES A DAY AS NEEDED 6.7 g 1  . aspirin 81 MG tablet Take 81 mg by mouth daily.    Marland Kitchen atorvastatin (LIPITOR) 20 MG tablet Take 1 tablet (20 mg total) by mouth daily. 90 tablet 3  . Calcium Carbonate-Vit D-Min (CALCIUM 1200 PO) Take 1 tablet by mouth daily.    . carisoprodol (SOMA) 350 MG tablet Take 350 mg by mouth daily as needed.    . Fluticasone-Salmeterol (ADVAIR) 250-50 MCG/DOSE AEPB Inhale 1 puff into the lungs  every 12 (twelve) hours. 60 each 3  . gabapentin (NEURONTIN) 600 MG tablet Take 600 mg by mouth 6 (six) times daily.     Marland Kitchen HYDROmorphone (DILAUDID) 4 MG tablet Take 4 mg by mouth daily as needed. For pain    . hydroxychloroquine (PLAQUENIL) 200 MG tablet Take 200 mg by mouth every evening.     . metoprolol tartrate (LOPRESSOR) 25 MG tablet TAKE 1 TABLET (25 MG TOTAL) BY MOUTH 2 (TWO) TIMES DAILY. 180 tablet 3  . montelukast (SINGULAIR) 10 MG tablet TAKE 1 TABLET BY MOUTH  ONCE A DAY 90 tablet 3  . Polyethyl Glycol-Propyl Glycol (SYSTANE OP) Apply to eye as needed.     No current facility-administered medications for this visit.     No Known Allergies  Social History   Social History  . Marital status: Married    Spouse name: N/A  . Number of children: N/A  . Years of education: N/A   Occupational History  . Not on file.   Social History Main Topics  . Smoking status: Former Smoker    Quit date: 01/12/1980  . Smokeless tobacco: Never Used  . Alcohol use No  . Drug use: No  . Sexual activity: Yes   Other Topics Concern  . Not on file   Social History Narrative  . No narrative on file     Review of Systems: General: negative for chills, fever, night sweats or weight changes.  Cardiovascular: negative for chest pain, dyspnea on exertion, edema,  orthopnea, palpitations, paroxysmal nocturnal dyspnea or shortness of breath Dermatological: negative for rash Respiratory: negative for cough or wheezing Urologic: negative for hematuria Abdominal: negative for nausea, vomiting, diarrhea, bright red blood per rectum, melena, or hematemesis Neurologic: negative for visual changes, syncope, or dizziness All other systems reviewed and are otherwise negative except as noted above.    Blood pressure 134/72, pulse 76, height 5\' 4"  (1.626 m), weight 179 lb (81.2 kg).  General appearance: alert and no distress Neck: no adenopathy, no carotid bruit, no JVD, supple, symmetrical, trachea  midline and thyroid not enlarged, symmetric, no tenderness/mass/nodules Lungs: clear to auscultation bilaterally Heart: regular rate and rhythm, S1, S2 normal, no murmur, click, rub or gallop Extremities: extremities normal, atraumatic, no cyanosis or edema  EKG not performed today  ASSESSMENT AND PLAN:   S/P CABG x 3: (lima-lad,svg-om,svg-rca) History of coronary artery disease status post coronary artery bypass grafting 3 01/12/11 by Dr. Prescott Gum with a LIMA to LAD, vein to obtuse marginal branch and RCA. Her procedure was complicated by postoperative sternal wound infection surgically treated. Over the last 6 months she's noticed decreased exercise tolerance and increasing dyspnea on exertion for no clear reason. She really denies chest pain. I'm going to obtain a 2-D echo and exercise Myoview to rule out an ischemic etiology.  Hyperlipidemia History of hyperlipidemia on statin therapy with recent lipid profile performed 01/29/15 revealed total cholesterol 173, LDL 58 and HDL of 103.      Lorretta Harp MD FACP,FACC,FAHA, [facility] 12/17/2015 8:53 AM

## 2015-12-17 NOTE — Patient Instructions (Signed)
Medication Instructions: Your physician recommends that you continue on your current medications as directed. Please refer to the Current Medication list given to you today.   Testing/Procedures: Your physician has requested that you have an echocardiogram. Echocardiography is a painless test that uses sound waves to create images of your heart. It provides your doctor with information about the size and shape of your heart and how well your heart's chambers and valves are working. This procedure takes approximately one hour. There are no restrictions for this procedure.  Your physician has requested that you have en exercise stress myoview. For further information please visit [email or web address]. Please follow instruction sheet, as given.   Follow-Up: Your physician recommends that you schedule a follow-up appointment after testing is complete.   Any Other Special Instructions will be listed below:   Exercise Stress Electrocardiogram An exercise stress electrocardiogram is a test to check how blood flows to your heart. It is done to find areas of poor blood flow. You will need to walk on a treadmill for this test. The electrocardiogram will record your heartbeat when you are at rest and when you are exercising. What happens before the procedure?  Do not have drinks with caffeine or foods with caffeine for 24 hours before the test, or as told by your doctor. This includes coffee, tea (even decaf tea), sodas, chocolate, and cocoa.  Follow your doctor's instructions about eating and drinking before the test.  Ask your doctor what medicines you should or should not take before the test. Take your medicines with water unless told by your doctor not to.  If you use an inhaler, bring it with you to the test.  Bring a snack to eat after the test.  Do not  smoke for 4 hours before the test.  Do not put lotions, powders, creams, or oils on your chest before the test.  Wear comfortable  shoes and clothing. What happens during the procedure?  You will have patches put on your chest. Small areas of your chest may need to be shaved. Wires will be connected to the patches.  Your heart rate will be watched while you are resting and while you are exercising.  You will walk on the treadmill. The treadmill will slowly get faster to raise your heart rate.  The test will take about 1-2 hours. What happens after the procedure?  Your heart rate and blood pressure will be watched after the test.  You may return to your normal diet, activities, and medicines or as told by your doctor. This information is not intended to replace advice given to you by your health care provider. Make sure you discuss any questions you have with your health care provider. Document Released: 06/07/2007 Document Revised: 08/18/2015 Document Reviewed: 08/26/2012 Elsevier Interactive Patient Education  2017 [employer].   If you need a refill on your cardiac medications before your next appointment, please call your pharmacy.

## 2015-12-17 NOTE — Assessment & Plan Note (Signed)
History of coronary artery disease status post coronary artery bypass grafting 3 01/12/11 by Dr. Prescott Gum with a LIMA to LAD, vein to obtuse marginal branch and RCA. Her procedure was complicated by postoperative sternal wound infection surgically treated. Over the last 6 months she's noticed decreased exercise tolerance and increasing dyspnea on exertion for no clear reason. She really denies chest pain. I'm going to obtain a 2-D echo and exercise Myoview to rule out an ischemic etiology.

## 2015-12-17 NOTE — Assessment & Plan Note (Signed)
History of hyperlipidemia on statin therapy with recent lipid profile performed 01/29/15 revealed total cholesterol 173, LDL 58 and HDL of 103.

## 2015-12-23 ENCOUNTER — Telehealth (HOSPITAL_COMMUNITY): Payer: Self-pay

## 2015-12-23 NOTE — Telephone Encounter (Signed)
Encounter complete. 

## 2015-12-24 ENCOUNTER — Telehealth (HOSPITAL_COMMUNITY): Payer: Self-pay

## 2015-12-24 NOTE — Telephone Encounter (Signed)
Encounter complete. 

## 2015-12-27 ENCOUNTER — Other Ambulatory Visit: Payer: Self-pay | Admitting: Cardiovascular Disease

## 2015-12-28 ENCOUNTER — Ambulatory Visit (HOSPITAL_COMMUNITY)
Admission: RE | Admit: 2015-12-28 | Discharge: 2015-12-28 | Disposition: A | Discharge status: Home / Self Care | Payer: PPO | Source: Ambulatory Visit | Attending: Cardiology | Admitting: Cardiology

## 2015-12-28 DIAGNOSIS — R0602 Shortness of breath: Secondary | ICD-10-CM | POA: Diagnosis not present

## 2015-12-28 LAB — MYOCARDIAL PERFUSION IMAGING
CHL CUP NUCLEAR SRS: 1
CSEPPHR: 134 {beats}/min
Estimated workload: 6 METS
Exercise duration (min): 5 min
Exercise duration (sec): 16 s
LV dias vol: 83 mL (ref 46–106)
LVSYSVOL: 36 mL
MPHR: 146 {beats}/min
Percent HR: 91 %
RPE: 19
Rest HR: 74 {beats}/min
SDS: 2
SSS: 3
TID: 0.99

## 2015-12-28 MED ORDER — TECHNETIUM TC 99M TETROFOSMIN IV KIT
32.0000 | PACK | Freq: Once | INTRAVENOUS | Status: AC | PRN
  Administered 2015-12-28: 32 via INTRAVENOUS
  Filled 2015-12-28: qty 32

## 2015-12-28 MED ORDER — TECHNETIUM TC 99M TETROFOSMIN IV KIT
10.4000 | PACK | Freq: Once | INTRAVENOUS | Status: AC | PRN
  Administered 2015-12-28: 10.4 via INTRAVENOUS
  Filled 2015-12-28: qty 11

## 2015-12-28 NOTE — Telephone Encounter (Signed)
REFILL 

## 2016-01-03 DIAGNOSIS — J189 Pneumonia, unspecified organism: Secondary | ICD-10-CM

## 2016-01-03 HISTORY — DX: Pneumonia, unspecified organism: J18.9

## 2016-01-06 ENCOUNTER — Other Ambulatory Visit: Payer: Self-pay

## 2016-01-06 ENCOUNTER — Ambulatory Visit (HOSPITAL_COMMUNITY): Payer: PPO | Attending: Cardiovascular Disease

## 2016-01-06 DIAGNOSIS — R0602 Shortness of breath: Secondary | ICD-10-CM | POA: Insufficient documentation

## 2016-01-06 DIAGNOSIS — I340 Nonrheumatic mitral (valve) insufficiency: Secondary | ICD-10-CM | POA: Insufficient documentation

## 2016-01-14 ENCOUNTER — Ambulatory Visit (INDEPENDENT_AMBULATORY_CARE_PROVIDER_SITE_OTHER): Payer: PPO | Admitting: Cardiovascular Disease

## 2016-01-14 ENCOUNTER — Encounter: Payer: Self-pay | Admitting: Cardiovascular Disease

## 2016-01-14 DIAGNOSIS — R0609 Other forms of dyspnea: Secondary | ICD-10-CM | POA: Diagnosis not present

## 2016-01-14 NOTE — Assessment & Plan Note (Signed)
[name] returns today for follow-up of her 2-D echocardiogram and Myoview stress test performed in evaluation of decreased exercise tolerance and dyspnea. The symptoms are new over the last 6 months. She is 5 years status post bypass grafting and denies chest pain. Her echo is normal and her Myoview was low risk and nonischemic. She does have a history of asthma. I will see her back in 3 months. If her symptoms are not improved with discussed proceeding with outpatient cardiac catheterization to define her anatomy and rule out an ischemic etiology.

## 2016-01-14 NOTE — Progress Notes (Signed)
[name] Long returns today for follow-up of her 2-D echocardiogram and Myoview stress test performed in evaluation of decreased exercise tolerance and dyspnea. The symptoms are new over the last 6 months. She is 5 years status post bypass grafting and denies chest pain. Her echo is normal and her Myoview was low risk and nonischemic. She does have a history of asthma. I will see her back in 3 months. If her symptoms are not improved with discussed proceeding with outpatient cardiac catheterization to define her anatomy and rule out an ischemic etiology.

## 2016-01-14 NOTE — Patient Instructions (Signed)
Medication Instructions: Your physician recommends that you continue on your current medications as directed. Please refer to the Current Medication list given to you today.   Follow-Up: Your physician recommends that you schedule a follow-up appointment in: 3 months with Dr. Berry.  If you need a refill on your cardiac medications before your next appointment, please call your pharmacy.  

## 2016-02-14 ENCOUNTER — Ambulatory Visit (INDEPENDENT_AMBULATORY_CARE_PROVIDER_SITE_OTHER): Payer: PPO | Admitting: Family Medicine

## 2016-02-14 ENCOUNTER — Ambulatory Visit
Admission: RE | Admit: 2016-02-14 | Discharge: 2016-02-14 | Disposition: A | Discharge status: Home / Self Care | Payer: PPO | Source: Ambulatory Visit | Attending: Family Medicine | Admitting: Family Medicine

## 2016-02-14 ENCOUNTER — Encounter: Payer: Self-pay | Admitting: Family Medicine

## 2016-02-14 VITALS — BP 124/82 | HR 80 | Wt 172.0 lb

## 2016-02-14 DIAGNOSIS — M25551 Pain in right hip: Secondary | ICD-10-CM
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
## 2016-02-16 DIAGNOSIS — G894 Chronic pain syndrome: Secondary | ICD-10-CM | POA: Diagnosis not present

## 2016-02-16 DIAGNOSIS — Z79891 Long term (current) use of opiate analgesic: Secondary | ICD-10-CM | POA: Diagnosis not present

## 2016-02-16 DIAGNOSIS — M47812 Spondylosis without myelopathy or radiculopathy, cervical region: Secondary | ICD-10-CM | POA: Diagnosis not present

## 2016-02-16 DIAGNOSIS — M150 Primary generalized (osteo)arthritis: Secondary | ICD-10-CM | POA: Diagnosis not present

## 2016-02-17 ENCOUNTER — Ambulatory Visit (INDEPENDENT_AMBULATORY_CARE_PROVIDER_SITE_OTHER): Payer: PPO | Admitting: Family Medicine

## 2016-02-17 DIAGNOSIS — M25551 Pain in right hip: Secondary | ICD-10-CM

## 2016-02-17 DIAGNOSIS — Z23 Encounter for immunization: Secondary | ICD-10-CM

## 2016-02-17 MED ORDER — LIDOCAINE HCL 2 % IJ SOLN
3.0000 mL | Freq: Once | INTRAMUSCULAR | Status: AC
  Administered 2016-02-17: 60 mg

## 2016-02-17 MED ORDER — TRIAMCINOLONE ACETONIDE 40 MG/ML IJ SUSP
40.0000 mg | Freq: Once | INTRAMUSCULAR | Status: AC
  Administered 2016-02-17: 40 mg via INTRAMUSCULAR

## 2016-02-17 NOTE — Progress Notes (Signed)
   Subjective:    Patient ID: [name] Long, female    DOB: November 18, 1940, 76 y.o.   MRN: [id number]  HPI She is here for possible right hip injection for possible trochanteric bursitis. She did have recent hip injury however I did not know until that she's had previous trochanteric bursa excision several years ago. Recent x-ray of the hip showed no pathology. Also review of record indicates she notes another pneumonia shot.  Review of Systems     Objective:   Physical Exam Alert and in no distress. Surgical scar is now noted in the right hip area.       Assessment & Plan:  Right hip pain - Plan: triamcinolone acetonide (KENALOG-40) injection 40 mg, lidocaine (XYLOCAINE) 2 % (with pres) injection 60 mg  Need for pneumococcal vaccination - Plan: Pneumococcal conjugate vaccine 13-valent Since she has had the excision of the bursa, and I do not feel comfortable doing an injection in that area without further evaluation and will therefore refer to have an ultrasound done and possible ultrasound-guided injection
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
## 2016-03-06 ENCOUNTER — Ambulatory Visit (INDEPENDENT_AMBULATORY_CARE_PROVIDER_SITE_OTHER): Payer: PPO | Admitting: Family Medicine

## 2016-03-06 ENCOUNTER — Ambulatory Visit: Payer: Self-pay

## 2016-03-06 ENCOUNTER — Encounter: Payer: Self-pay | Admitting: Family Medicine

## 2016-03-06 VITALS — BP 130/78 | HR 86 | Ht 64.0 in | Wt 174.0 lb

## 2016-03-06 DIAGNOSIS — M25551 Pain in right hip: Secondary | ICD-10-CM

## 2016-03-06 DIAGNOSIS — M5416 Radiculopathy, lumbar region: Secondary | ICD-10-CM | POA: Insufficient documentation

## 2016-03-06 MED ORDER — VITAMIN D (ERGOCALCIFEROL) 1.25 MG (50000 UNIT) PO CAPS: 50000.0000 [IU] | ORAL_CAPSULE | ORAL | 0 refills | Status: DC

## 2016-03-06 MED ORDER — PREDNISONE 50 MG PO TABS: ORAL_TABLET | ORAL | 0 refills | Status: DC

## 2016-03-06 NOTE — Patient Instructions (Addendum)
Good to see you  [name] Long is your friend. Ice 20 minutes 2 times daily. Usually after activity and before bed. Prednisone daily for 5 days.  Once weekly vitamin D for next 12 weeks.  After the prednisone try  Turmeric 500mg  daily with food.  Tart cherry extract any dose at night See me again in 7-10 days to make sure you are dong better (OK to double book)

## 2016-03-06 NOTE — Assessment & Plan Note (Signed)
Patient does have more of a lumbar radiculopathy. Discussed with patient at great length. We discussed that we would like to do an ultrasound potentially of the hip. Patient states that if I do feel that it's mostly in the back than what we do this. Patient declined having it done today. We discussed which activities to do a which ones to potentially avoid. Patient is going to do a short course of prednisone. Has done this multiple times for her autoimmune disease. Patient's also will do a once weekly vitamin D to see if this would decrease the exacerbations from any rheumatoid arthritis. I do believe the patient is having radicular symptoms of the leg and is having some mild weakness noted. We discussed intensive imaging may be warranted but we will have patient follow-up again in one week to make sure patient is responding to conservative therapy.

## 2016-03-06 NOTE — Addendum Note (Signed)
Addended by: Douglass Rivers T on: 03/06/2016 03:23 PM   Modules accepted: Orders
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
## 2016-03-15 ENCOUNTER — Other Ambulatory Visit: Payer: Self-pay

## 2016-03-15 ENCOUNTER — Encounter: Payer: Self-pay | Admitting: Family Medicine

## 2016-03-15 ENCOUNTER — Ambulatory Visit (INDEPENDENT_AMBULATORY_CARE_PROVIDER_SITE_OTHER): Payer: PPO | Admitting: Family Medicine

## 2016-03-15 VITALS — BP 136/86 | HR 75 | Ht 64.0 in | Wt 170.5 lb

## 2016-03-15 DIAGNOSIS — M5416 Radiculopathy, lumbar region: Secondary | ICD-10-CM | POA: Diagnosis not present

## 2016-03-15 NOTE — Patient Instructions (Addendum)
Good to see you  I am sorry you are hurting.  We will get MRI of your hip and back and see what is going on  Ice is still good Continue the vitamin D I will write you when I get the results and we will discuss.
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
## 2016-03-24 ENCOUNTER — Other Ambulatory Visit: Payer: PPO

## 2016-03-29 ENCOUNTER — Ambulatory Visit
Admission: RE | Admit: 2016-03-29 | Discharge: 2016-03-29 | Disposition: A | Discharge status: Home / Self Care | Payer: PPO | Source: Ambulatory Visit | Attending: Family Medicine | Admitting: Family Medicine

## 2016-03-29 DIAGNOSIS — M5416 Radiculopathy, lumbar region: Secondary | ICD-10-CM

## 2016-03-29 DIAGNOSIS — M25551 Pain in right hip: Secondary | ICD-10-CM | POA: Diagnosis not present

## 2016-03-29 DIAGNOSIS — M545 Low back pain: Secondary | ICD-10-CM | POA: Diagnosis not present

## 2016-04-05 ENCOUNTER — Other Ambulatory Visit: Payer: Self-pay | Admitting: Cardiovascular Disease

## 2016-04-05 NOTE — Telephone Encounter (Signed)
Rx(s) sent to pharmacy electronically.  

## 2016-04-11 ENCOUNTER — Telehealth: Payer: Self-pay | Admitting: Family Medicine

## 2016-04-11 ENCOUNTER — Other Ambulatory Visit: Payer: Self-pay

## 2016-04-11 DIAGNOSIS — H524 Presbyopia: Secondary | ICD-10-CM | POA: Diagnosis not present

## 2016-04-11 DIAGNOSIS — M545 Low back pain, unspecified: Secondary | ICD-10-CM

## 2016-04-11 NOTE — Telephone Encounter (Signed)
Pt called wanting results from MRI. Please call back.

## 2016-04-11 NOTE — Telephone Encounter (Signed)
Spoke with patient and let her know that we will be ordering epidural nerve root injection.

## 2016-04-17 ENCOUNTER — Ambulatory Visit
Admission: RE | Admit: 2016-04-17 | Discharge: 2016-04-17 | Disposition: A | Discharge status: Home / Self Care | Payer: PPO | Source: Ambulatory Visit | Attending: Family Medicine | Admitting: Family Medicine

## 2016-04-17 DIAGNOSIS — M545 Low back pain, unspecified: Secondary | ICD-10-CM

## 2016-04-17 DIAGNOSIS — M47817 Spondylosis without myelopathy or radiculopathy, lumbosacral region: Secondary | ICD-10-CM | POA: Diagnosis not present

## 2016-04-17 MED ORDER — METHYLPREDNISOLONE ACETATE 40 MG/ML INJ SUSP (RADIOLOG
120.0000 mg | Freq: Once | INTRAMUSCULAR | Status: AC
  Administered 2016-04-17: 120 mg via INTRA_ARTICULAR

## 2016-04-17 MED ORDER — IOPAMIDOL (ISOVUE-M 200) INJECTION 41%
1.0000 mL | Freq: Once | INTRAMUSCULAR | Status: AC
  Administered 2016-04-17: 1 mL via INTRA_ARTICULAR

## 2016-04-17 NOTE — Discharge Instructions (Signed)

## 2016-04-25 ENCOUNTER — Other Ambulatory Visit: Payer: PPO
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
## 2016-05-01 ENCOUNTER — Ambulatory Visit: Payer: Self-pay

## 2016-05-01 ENCOUNTER — Ambulatory Visit (INDEPENDENT_AMBULATORY_CARE_PROVIDER_SITE_OTHER): Payer: PPO | Admitting: Family Medicine

## 2016-05-01 ENCOUNTER — Encounter: Payer: Self-pay | Admitting: Family Medicine

## 2016-05-01 VITALS — BP 156/84 | HR 59 | Resp 16 | Wt 169.1 lb

## 2016-05-01 DIAGNOSIS — M5416 Radiculopathy, lumbar region: Secondary | ICD-10-CM | POA: Diagnosis not present

## 2016-05-01 DIAGNOSIS — M25559 Pain in unspecified hip: Secondary | ICD-10-CM

## 2016-05-01 DIAGNOSIS — M1611 Unilateral primary osteoarthritis, right hip: Secondary | ICD-10-CM | POA: Diagnosis not present

## 2016-05-01 NOTE — Assessment & Plan Note (Signed)
Patient given injection today and tolerated the procedure well. Patient is on multiple different pain medications already at this time. Patient hopefully will get benefit from this. Otherwise patient may need a hip replacement. Patient will be referred today for further evaluation. We discussed potential epidural of the back as well but patient did not respond well to the nerve root injection.

## 2016-05-01 NOTE — Assessment & Plan Note (Signed)
Patient did not have any improvement after the nerve root injection. Still may need to consider potential epidural if patient does not make improvement after the hip injection. We discussed icing regimen and home exercises. We will consider further evaluation by orthopedic surgery and patient will be referred today. Patient will follow-up with me again in 4 weeks.

## 2016-05-01 NOTE — Patient Instructions (Signed)
Ice is your friend.  Should notice improvement soon and improve over next 2 weeks.  Stay active Continue same medicines If this does not work I am at a loss and may need additional tests.  If it does help I want to see you 1 week before your trip

## 2016-05-03 ENCOUNTER — Encounter: Payer: Self-pay | Admitting: Family Medicine

## 2016-05-05 DIAGNOSIS — M1611 Unilateral primary osteoarthritis, right hip: Secondary | ICD-10-CM | POA: Diagnosis not present

## 2016-05-05 DIAGNOSIS — M5416 Radiculopathy, lumbar region: Secondary | ICD-10-CM | POA: Diagnosis not present

## 2016-05-08 DIAGNOSIS — M150 Primary generalized (osteo)arthritis: Secondary | ICD-10-CM | POA: Diagnosis not present

## 2016-05-08 DIAGNOSIS — G894 Chronic pain syndrome: Secondary | ICD-10-CM | POA: Diagnosis not present

## 2016-05-08 DIAGNOSIS — M25551 Pain in right hip: Secondary | ICD-10-CM | POA: Diagnosis not present

## 2016-05-08 DIAGNOSIS — Z79891 Long term (current) use of opiate analgesic: Secondary | ICD-10-CM | POA: Diagnosis not present

## 2016-05-09 ENCOUNTER — Ambulatory Visit: Payer: PPO | Admitting: Family Medicine

## 2016-05-19 ENCOUNTER — Ambulatory Visit: Payer: PPO | Admitting: Family Medicine

## 2016-05-25 DIAGNOSIS — M5416 Radiculopathy, lumbar region: Secondary | ICD-10-CM | POA: Diagnosis not present

## 2016-05-31 ENCOUNTER — Other Ambulatory Visit: Payer: Self-pay | Admitting: Family Medicine

## 2016-05-31 NOTE — Telephone Encounter (Signed)
Refill done.  

## 2016-06-02 ENCOUNTER — Ambulatory Visit: Payer: PPO | Admitting: Family Medicine

## 2016-06-08 DIAGNOSIS — M1611 Unilateral primary osteoarthritis, right hip: Secondary | ICD-10-CM | POA: Diagnosis not present

## 2016-06-09 DIAGNOSIS — M5416 Radiculopathy, lumbar region: Secondary | ICD-10-CM | POA: Diagnosis not present

## 2016-07-03 DIAGNOSIS — J189 Pneumonia, unspecified organism: Secondary | ICD-10-CM | POA: Diagnosis not present

## 2016-07-03 DIAGNOSIS — R05 Cough: Secondary | ICD-10-CM | POA: Diagnosis not present

## 2016-07-03 DIAGNOSIS — Z87891 Personal history of nicotine dependence: Secondary | ICD-10-CM | POA: Diagnosis not present

## 2016-07-03 DIAGNOSIS — J4521 Mild intermittent asthma with (acute) exacerbation: Secondary | ICD-10-CM | POA: Diagnosis not present

## 2016-07-12 DIAGNOSIS — M1611 Unilateral primary osteoarthritis, right hip: Secondary | ICD-10-CM | POA: Diagnosis not present

## 2016-07-14 ENCOUNTER — Other Ambulatory Visit: Payer: Self-pay | Admitting: Family Medicine

## 2016-07-14 DIAGNOSIS — E785 Hyperlipidemia, unspecified: Secondary | ICD-10-CM

## 2016-07-14 NOTE — Telephone Encounter (Signed)
Has an appt on 7/30

## 2016-07-25 ENCOUNTER — Other Ambulatory Visit: Payer: Self-pay | Admitting: Orthopaedic Surgery

## 2016-07-31 ENCOUNTER — Ambulatory Visit (INDEPENDENT_AMBULATORY_CARE_PROVIDER_SITE_OTHER): Payer: PPO | Admitting: Family Medicine

## 2016-07-31 ENCOUNTER — Encounter: Payer: Self-pay | Admitting: Family Medicine

## 2016-07-31 VITALS — BP 120/70 | HR 80 | Ht 64.0 in | Wt 178.0 lb

## 2016-07-31 DIAGNOSIS — E7800 Pure hypercholesterolemia, unspecified: Secondary | ICD-10-CM | POA: Diagnosis not present

## 2016-07-31 DIAGNOSIS — M797 Fibromyalgia: Secondary | ICD-10-CM | POA: Diagnosis not present

## 2016-07-31 DIAGNOSIS — Z0000 Encounter for general adult medical examination without abnormal findings: Secondary | ICD-10-CM | POA: Diagnosis not present

## 2016-07-31 DIAGNOSIS — Z951 Presence of aortocoronary bypass graft: Secondary | ICD-10-CM | POA: Diagnosis not present

## 2016-07-31 DIAGNOSIS — L821 Other seborrheic keratosis: Secondary | ICD-10-CM

## 2016-07-31 DIAGNOSIS — H9319 Tinnitus, unspecified ear: Secondary | ICD-10-CM | POA: Diagnosis not present

## 2016-07-31 DIAGNOSIS — G8929 Other chronic pain: Secondary | ICD-10-CM | POA: Diagnosis not present

## 2016-07-31 DIAGNOSIS — Z86010 Personal history of colonic polyps: Secondary | ICD-10-CM

## 2016-07-31 DIAGNOSIS — M069 Rheumatoid arthritis, unspecified: Secondary | ICD-10-CM | POA: Diagnosis not present

## 2016-07-31 DIAGNOSIS — Z87891 Personal history of nicotine dependence: Secondary | ICD-10-CM

## 2016-07-31 DIAGNOSIS — M1611 Unilateral primary osteoarthritis, right hip: Secondary | ICD-10-CM | POA: Diagnosis not present

## 2016-07-31 DIAGNOSIS — K219 Gastro-esophageal reflux disease without esophagitis: Secondary | ICD-10-CM

## 2016-07-31 DIAGNOSIS — H9190 Unspecified hearing loss, unspecified ear: Secondary | ICD-10-CM | POA: Diagnosis not present

## 2016-07-31 DIAGNOSIS — M549 Dorsalgia, unspecified: Secondary | ICD-10-CM

## 2016-07-31 DIAGNOSIS — J4530 Mild persistent asthma, uncomplicated: Secondary | ICD-10-CM

## 2016-07-31 DIAGNOSIS — B079 Viral wart, unspecified: Secondary | ICD-10-CM | POA: Diagnosis not present

## 2016-07-31 MED ORDER — MONTELUKAST SODIUM 10 MG PO TABS: ORAL_TABLET | ORAL | 3 refills | Status: DC

## 2016-07-31 MED ORDER — SILODOSIN 4 MG PO CAPS: 4.0000 mg | ORAL_CAPSULE | Freq: Every day | ORAL | 1 refills | Status: DC

## 2016-07-31 MED ORDER — FLUTICASONE-SALMETEROL 500-50 MCG/DOSE IN AEPB: 1.0000 | INHALATION_SPRAY | Freq: Two times a day (BID) | RESPIRATORY_TRACT | 11 refills | Status: DC

## 2016-07-31 NOTE — Progress Notes (Signed)
Subjective:   HPI  [name] Long is a 76 y.o. female who presents for No chief complaint on file.   Medical care team includes: Denita Lung, MD here for primary care  Dr.Zachery Spero [facility] Dr.Philps   Preventative care Last ophthalmology visit: 08/2016 Dr.Mathews Last dental visit: 2017 Last colonoscopy: 08/05/13 Carlean Purl Last mammogram: 07/30/15 normal Last pap: N/A Last EKG:11/29/15 Last labs:11/29/15  Prior vaccinations:TD or Tdap: 07/19/09 Influenza:10/22/15 Pneumococcal: 23: 11/19/02,09/23/12,12/04/12   13: 04/08/15,02/17/16 Shingles/Zostavax: 04/16/08,03/30/10   Advanced directive: Yes. Copy in the record. She continues to have right hip pain and has had injections to no avail. She is scheduled for surgery later this month. In the past she had difficulty with urinary retention and would like to be waist on medication to prevent this. She has been seen in the past by urology and recommended Rapaflo. She also has a lesion present on her left thumb she would like evaluated. She was recently treated for pneumonia at an outside hospital and was given steroids as well as azithromycin. She continues on her asthma medications and has been using the rescue inhaler twice per week during the day and also at night. She does have underlying rheumatoid arthritis as well as fibromyalgia and chronic pain. She is being followed by Dr. Zenia Resides at [facility] for the rheumatoid arthritis/fibromyalgia. Dr. Hardin Negus is taking care of her pain management with Dilaudid, Soma and also given gabapentin. She has reflux disease but is having no present difficulty with that. She also has underlying heart disease but is having no chest pain, shortness of breath, PND or DOE she is a former smoker. She had a colonoscopy in 2015. She does have tinnitus and does wear hearing aids that and presbycusis.  Reviewed their medical, surgical, family, social, medication, and allergy history and updated chart as  appropriate.  Past Medical History:  Diagnosis Date  . Abnormal EKG 01/12/2011  . Allergy    RHINITIS  . Arthritis   . Asthma   . Asthma 01/12/2011  . Back pain, chronic--plans were for cervical disc surgery week of 01/16/11 01/12/2011  . Blood transfusion without reported diagnosis 2004  . Chronic low back pain   . Connective tissue disease ([place])   . Coronary artery disease   . Crescendo angina, with EKG changes, new. 01/12/2011  . Fibromyalgia   . Fibromyalgia   . History of stress test 04/2011   Normal stress test  . Hx of echocardiogram 07/19/2011   EF 50-55%. There is mild annular calcification, there is trace migtral regurgitation, there is trace tricupid regurgitation, pericardium is thick, there is no pericardial effusion.  . Hypertension   . Myocardial infarction [facility]) 2013   triple heart bypass  . Osteoporosis    OSTEOPENIA  . Personal history of colonic polyp - adenoma 08/05/2013   08/2013 - 7 mm adenoma - consider repeat colonoscopy 2020 (age 59)  . PPD positive   . Status post coronary artery bypass grafting   . Urinary incontinence     Past Surgical History:  Procedure Laterality Date  . ABDOMINAL HYSTERECTOMY  1981  . APPENDECTOMY  1946  . BLADDER REPAIR  1985  . BREAST SURGERY  1992   REDUCTION  . CARPAL TUNNEL RELEASE Bilateral 1988, 1999  . [place]  . CORONARY ARTERY BYPASS GRAFT  01/14/2011   Procedure: CORONARY ARTERY BYPASS GRAFTING (CABG);  Surgeon: Tharon Aquas Adelene Idler, MD;  Location: [place];  Service: Open Heart Surgery;  Laterality: N/A;  CABG x three, using right greater saphenous vein harvested endoscopically  . LEFT HEART CATHETERIZATION WITH CORONARY ANGIOGRAM N/A 01/13/2011   Procedure: LEFT HEART CATHETERIZATION WITH CORONARY ANGIOGRAM;  Surgeon: Troy Sine, MD;  Location: [facility] CATH LAB;  Service: Cardiovascular;  Laterality: N/A;  . PUBOVAGINAL SLING  2002  . STERNAL WOUND DEBRIDEMENT  07/27/2011   Procedure: STERNAL  WOUND DEBRIDEMENT;  Surgeon: Ivin Poot, MD;  Location: [place];  Service: Open Heart Surgery;  Laterality: N/A;  . TONSILLECTOMY  1952  . TRIGGER FINGER RELEASE Right 2014   4th finger    Social History   Social History  . Marital status: Married    Spouse name: N/A  . Number of children: N/A  . Years of education: N/A   Occupational History  . Not on file.   Social History Main Topics  . Smoking status: Former Smoker    Quit date: 01/12/1980  . Smokeless tobacco: Never Used  . Alcohol use No  . Drug use: No  . Sexual activity: Yes   Other Topics Concern  . Not on file   Social History Narrative  . No narrative on file    Family History  Problem Relation Age of Onset  . Adopted: Yes  . Hypertension Brother      Current Outpatient Prescriptions:  .  albuterol (PROVENTIL HFA) 108 (90 Base) MCG/ACT inhaler, INHALE 2 PUFF AS DIRECTED FOUR TIMES A DAY AS NEEDED, Disp: 6.7 g, Rfl: 1 .  aspirin 81 MG tablet, Take 81 mg by mouth daily., Disp: , Rfl:  .  atorvastatin (LIPITOR) 20 MG tablet, TAKE ONE TABLET BY MOUTH DAILY, Disp: 30 tablet, Rfl: 0 .  Calcium Carbonate-Vit D-Min (CALCIUM 1200 PO), Take 1 tablet by mouth daily., Disp: , Rfl:  .  carisoprodol (SOMA) 350 MG tablet, Take 350 mg by mouth daily as needed., Disp: , Rfl:  .  Fluticasone-Salmeterol (ADVAIR) 250-50 MCG/DOSE AEPB, Inhale 1 puff into the lungs every 12 (twelve) hours., Disp: 60 each, Rfl: 3 .  gabapentin (NEURONTIN) 600 MG tablet, Take 600 mg by mouth 6 (six) times daily. , Disp: , Rfl:  .  HYDROmorphone (DILAUDID) 4 MG tablet, Take 4 mg by mouth daily as needed. For pain, Disp: , Rfl:  .  hydroxychloroquine (PLAQUENIL) 200 MG tablet, Take 200 mg by mouth every evening. , Disp: , Rfl:  .  metoprolol tartrate (LOPRESSOR) 25 MG tablet, TAKE 1 TABLET (25 MG TOTAL) BY MOUTH 2 (TWO) TIMES DAILY., Disp: 180 tablet, Rfl: 2 .  montelukast (SINGULAIR) 10 MG tablet, TAKE 1 TABLET BY MOUTH  ONCE A DAY, Disp: 90  tablet, Rfl: 3 .  Polyethyl Glycol-Propyl Glycol (SYSTANE OP), Apply to eye as needed., Disp: , Rfl:  .  predniSONE (DELTASONE) 50 MG tablet, Take 1 tablet daily., Disp: 5 tablet, Rfl: 0 .  Vitamin D, Ergocalciferol, (DRISDOL) 50000 units CAPS capsule, TAKE 1 CAPSULE BY MOUTH EVERY 7 DAYS, Disp: 12 capsule, Rfl: 0  No Known Allergies     Review of Systems    Objective:   General appearance: alert, no distress, WD/WN, Caucasian female Skin: Wart present on the left thumb. Pigmented round smooth lesions noted on the back. HEENT: normocephalic, conjunctiva/corneas normal, sclerae anicteric, PERRLA, EOMi, nares patent, no discharge or erythema, pharynx normal Oral cavity: MMM, tongue normal, teeth normal Neck: supple, no lymphadenopathy, no thyromegaly, no masses, normal ROM, no bruits Chest: non tender, normal shape and expansion Heart: RRR, normal S1,  S2, no murmurs Lungs: CTA bilaterally, no wheezes, rhonchi, or rales Abdomen: +bs, soft, non tender, non distended, no masses, no hepatomegaly, no splenomegaly, no bruits Musculoskeletal: upper extremities non tender, no obvious deformity, normal ROM throughout, lower extremities non tender, no obvious deformity, normal ROM throughout Extremities: no edema, no cyanosis, no clubbing Pulses: 2+ symmetric, upper and lower extremities, normal cap refill Neurological: alert, oriented x 3, CN2-12 intact, strength normal upper extremities and lower extremities, sensation normal throughout, DTRs 2+ throughout, no cerebellar signs, gait normal Psychiatric: normal affect, behavior normal, pleasant     Assessment and Plan :    Routine general medical examination at a health care facility  Former smoker, stopped smoking in distant past  S/P CABG x 3: (lima-lad,svg-om,svg-rca)  Pure hypercholesterolemia  Fibromyalgia  Gastroesophageal reflux disease without esophagitis  Mild persistent chronic asthma without complication - Plan:  Fluticasone-Salmeterol (ADVAIR DISKUS) 500-50 MCG/DOSE AEPB  Personal history of colonic polyp - adenoma  High frequency hearing loss, unspecified laterality  Asthma, chronic, mild persistent, uncomplicated - Plan: montelukast (SINGULAIR) 10 MG tablet  Viral warts, unspecified type  Arthritis of right hip  Chronic back pain, unspecified back location, unspecified back pain laterality  Rheumatoid arthritis involving both hips, unspecified rheumatoid factor presence (HCC)  Tinnitus, unspecified laterality  Seborrheic keratosis Discussed proper care for the wart in terms of using Compound W. No therapy needed for the seborrheic keratosis. She will continue to be followed by her rheumatologist, pain management and cardiology. Rapaflo was called in to help with potential bladder issues. She will increase her Advair 250/50/500 and I will also renew her albuterol. Otherwise she will continue on her present medication regimen. Physical exam - discussed and counseled on healthy lifestyle, diet, exercise, preventative care, vaccinations, sick and well care, proper use of emergency dept and after hours care, and addressed their concerns.    *

## 2016-07-31 NOTE — Patient Instructions (Signed)
Use Compound W daily for 3 or 4 days and then the next day take an emory board or a knife blade and shave off of white dead tissue let it go for couple of days a repeat this weekly until you can see her thumb print lines going all the way across

## 2016-08-07 DIAGNOSIS — Z79891 Long term (current) use of opiate analgesic: Secondary | ICD-10-CM | POA: Diagnosis not present

## 2016-08-07 DIAGNOSIS — M150 Primary generalized (osteo)arthritis: Secondary | ICD-10-CM | POA: Diagnosis not present

## 2016-08-07 DIAGNOSIS — M25551 Pain in right hip: Secondary | ICD-10-CM | POA: Diagnosis not present

## 2016-08-07 DIAGNOSIS — G894 Chronic pain syndrome: Secondary | ICD-10-CM | POA: Diagnosis not present

## 2016-08-14 ENCOUNTER — Telehealth: Payer: Self-pay

## 2016-08-14 ENCOUNTER — Other Ambulatory Visit: Payer: Self-pay | Admitting: Family Medicine

## 2016-08-14 DIAGNOSIS — E785 Hyperlipidemia, unspecified: Secondary | ICD-10-CM

## 2016-08-14 NOTE — Telephone Encounter (Signed)
Pt informed and said she cant get in to ENT before her surgery Tuesday she would hold off on med for a while

## 2016-08-14 NOTE — Telephone Encounter (Signed)
Dr.lalonde

## 2016-08-14 NOTE — Telephone Encounter (Signed)
Pt states that she is taking Advair  500/50- she states she is still experiencing the sore throat. She is gargling with warm salt waters, and rinses her mouth out every time she uses the Advair and spits the water out. Any other recommendations. She states the Advair really helps her lung symptoms, but causes sore throat. CB #   [id number].

## 2016-08-14 NOTE — Telephone Encounter (Signed)
My thought would be to have her see ENT and look in there to see if she maybe has some candida overgrowth

## 2016-08-15 NOTE — Pre-Procedure Instructions (Signed)
[name] Long  08/15/2016      [place] [address], [place] [address] Phone: [phone] Fax: [phone]  [place] [place] [address] [place] [address] Phone: [phone] Fax: [phone]    Your procedure is scheduled on Tuesday, August 22, 2016   Report to [facility] Admitting Entrance "A" at 8:15 A.M.  Call this number if you have problems the morning of surgery:  [phone]   Remember:  Do not eat food or drink liquids after midnight on August 21, 2016  Follow your doctor's instructions for your Aspirin and Heart medications. Otherwise, follow these medication instructions as well:  Take these medicines the morning of surgery with A SIP OF WATER: Gabapentin (NEURONTIN), Metoprolol tartrate (LOPRESSOR),  Silodosin (RAPAFLO), and (ADVAIR). If needed HYDROmorphone (DILAUDID) for pain, (SYSTANE OP) eye drops, and Albuterol inhaler for cough or wheezing (bring with you the day of surgery).  7 days before surgery stop taking all other Aspirin products, Vitamins, Fish oils, and Herbal medications. Also stop all NSAIDS i.e. Advil, Motrin, Aleve, Anaprox, Naproxen, BC and Goody Powders.   Do not wear jewelry, make-up or nail polish.  Do not wear lotions, powders, or perfumes, or deodorant.  Do not shave 48 hours prior to surgery.    Do not bring valuables to the hospital.  [facility] is not responsible for any belongings or valuables.  Contacts, dentures or bridgework may not be worn into surgery.  Leave your suitcase in the car.  After surgery it may be brought to your room.  For patients admitted to the hospital, discharge time will be determined by your treatment team.  Patients discharged the day of surgery will not be allowed to drive home.   Special instructions:   [place]- Preparing For Surgery  Before  surgery, you can play an important role. Because skin is not sterile, your skin needs to be as free of germs as possible. You can reduce the number of germs on your skin by washing with CHG (chlorahexidine gluconate) Soap before surgery.  CHG is an antiseptic cleaner which kills germs and bonds with the skin to continue killing germs even after washing.  Please do not use if you have an allergy to CHG or antibacterial soaps. If your skin becomes reddened/irritated stop using the CHG.  Do not shave (including legs and underarms) for at least 48 hours prior to first CHG shower. It is OK to shave your face.  Please follow these instructions carefully.   1. Shower the NIGHT BEFORE SURGERY and the MORNING OF SURGERY with CHG.   2. If you chose to wash your hair, wash your hair first as usual with your normal shampoo.  3. After you shampoo, rinse your hair and body thoroughly to remove the shampoo.  4. Use CHG as you would any other liquid soap. You can apply CHG directly to the skin and wash gently with a scrungie or a clean washcloth.   5. Apply the CHG Soap to your body ONLY FROM THE NECK DOWN.  Do not use on open wounds or open sores. Avoid contact with your eyes, ears, mouth and genitals (private parts). Wash genitals (private parts) with your normal soap.  6. Wash thoroughly, paying special attention to the area where your surgery will be performed.  7. Thoroughly rinse your body  with warm water from the neck down.  8. DO NOT shower/wash with your normal soap after using and rinsing off the CHG Soap.  9. Pat yourself dry with a CLEAN TOWEL.   10. Wear CLEAN PAJAMAS   11. Place CLEAN SHEETS on your bed the night of your first shower and DO NOT SLEEP WITH PETS.  Day of Surgery: Do not apply any deodorants/lotions. Please wear clean clothes to the hospital/surgery center.    Please read over the following fact sheets that you were given. Pain Booklet, Coughing and Deep Breathing, Blood  Transfusion Information, MRSA Information and Surgical Site Infection Prevention

## 2016-08-16 ENCOUNTER — Encounter (HOSPITAL_COMMUNITY): Payer: Self-pay

## 2016-08-16 ENCOUNTER — Encounter (HOSPITAL_COMMUNITY)
Admission: RE | Admit: 2016-08-16 | Discharge: 2016-08-16 | Disposition: A | Discharge status: Home / Self Care | Payer: PPO | Source: Ambulatory Visit | Attending: Orthopaedic Surgery | Admitting: Orthopaedic Surgery

## 2016-08-16 DIAGNOSIS — Z951 Presence of aortocoronary bypass graft: Secondary | ICD-10-CM | POA: Diagnosis not present

## 2016-08-16 DIAGNOSIS — K219 Gastro-esophageal reflux disease without esophagitis: Secondary | ICD-10-CM | POA: Diagnosis not present

## 2016-08-16 DIAGNOSIS — Z87891 Personal history of nicotine dependence: Secondary | ICD-10-CM | POA: Diagnosis not present

## 2016-08-16 DIAGNOSIS — I2510 Atherosclerotic heart disease of native coronary artery without angina pectoris: Secondary | ICD-10-CM | POA: Diagnosis not present

## 2016-08-16 DIAGNOSIS — Z86718 Personal history of other venous thrombosis and embolism: Secondary | ICD-10-CM | POA: Diagnosis not present

## 2016-08-16 DIAGNOSIS — Z01818 Encounter for other preprocedural examination: Secondary | ICD-10-CM | POA: Diagnosis not present

## 2016-08-16 DIAGNOSIS — I10 Essential (primary) hypertension: Secondary | ICD-10-CM | POA: Diagnosis not present

## 2016-08-16 DIAGNOSIS — J45909 Unspecified asthma, uncomplicated: Secondary | ICD-10-CM | POA: Diagnosis not present

## 2016-08-16 DIAGNOSIS — Z7982 Long term (current) use of aspirin: Secondary | ICD-10-CM | POA: Insufficient documentation

## 2016-08-16 HISTORY — DX: Headache: R51

## 2016-08-16 HISTORY — DX: Headache, unspecified: R51.9

## 2016-08-16 HISTORY — DX: Other specified postprocedural states: Z98.890

## 2016-08-16 HISTORY — DX: Pneumonia, unspecified organism: J18.9

## 2016-08-16 HISTORY — DX: Acute embolism and thrombosis of unspecified vein: I82.90

## 2016-08-16 HISTORY — DX: Other complications of anesthesia, initial encounter: T88.59XA

## 2016-08-16 HISTORY — DX: Nausea with vomiting, unspecified: R11.2

## 2016-08-16 HISTORY — DX: Adverse effect of unspecified anesthetic, initial encounter: T41.45XA

## 2016-08-16 HISTORY — DX: Myoneural disorder, unspecified: G70.9

## 2016-08-16 HISTORY — DX: Dyspnea, unspecified: R06.00

## 2016-08-16 LAB — CBC WITH DIFFERENTIAL/PLATELET
BASOS ABS: 0.1 10*3/uL (ref 0.0–0.1)
BASOS PCT: 1 %
EOS ABS: 0.2 10*3/uL (ref 0.0–0.7)
EOS PCT: 4 %
HCT: 40 % (ref 36.0–46.0)
Hemoglobin: 13.2 g/dL (ref 12.0–15.0)
LYMPHS PCT: 27 %
Lymphs Abs: 1.5 10*3/uL (ref 0.7–4.0)
MCH: 29.7 pg (ref 26.0–34.0)
MCHC: 33 g/dL (ref 30.0–36.0)
MCV: 90.1 fL (ref 78.0–100.0)
Monocytes Absolute: 0.5 10*3/uL (ref 0.1–1.0)
Monocytes Relative: 8 %
Neutro Abs: 3.3 10*3/uL (ref 1.7–7.7)
Neutrophils Relative %: 60 %
PLATELETS: 181 10*3/uL (ref 150–400)
RBC: 4.44 MIL/uL (ref 3.87–5.11)
RDW: 13 % (ref 11.5–15.5)
WBC: 5.5 10*3/uL (ref 4.0–10.5)

## 2016-08-16 LAB — BASIC METABOLIC PANEL
Anion gap: 10 (ref 5–15)
BUN: 23 mg/dL — AB (ref 6–20)
CALCIUM: 9.1 mg/dL (ref 8.9–10.3)
CO2: 25 mmol/L (ref 22–32)
CREATININE: 1.07 mg/dL — AB (ref 0.44–1.00)
Chloride: 107 mmol/L (ref 101–111)
GFR calc Af Amer: 58 mL/min — ABNORMAL LOW (ref >60)
GFR, EST NON AFRICAN AMERICAN: 50 mL/min — AB (ref >60)
Glucose, Bld: 93 mg/dL (ref 65–99)
POTASSIUM: 4.2 mmol/L (ref 3.5–5.1)
SODIUM: 142 mmol/L (ref 135–145)

## 2016-08-16 LAB — APTT: aPTT: 35 seconds (ref 24–36)

## 2016-08-16 LAB — TYPE AND SCREEN
ABO/RH(D): O POS
ANTIBODY SCREEN: NEGATIVE

## 2016-08-16 LAB — URINALYSIS, ROUTINE W REFLEX MICROSCOPIC
Bilirubin Urine: NEGATIVE
Glucose, UA: NEGATIVE mg/dL
Hgb urine dipstick: NEGATIVE
Ketones, ur: NEGATIVE mg/dL
Nitrite: POSITIVE — AB
PROTEIN: NEGATIVE mg/dL
SPECIFIC GRAVITY, URINE: 1.009 (ref 1.005–1.030)
Squamous Epithelial / LPF: NONE SEEN
pH: 6 (ref 5.0–8.0)

## 2016-08-16 LAB — PROTIME-INR
INR: 0.94
PROTHROMBIN TIME: 12.6 s (ref 11.4–15.2)

## 2016-08-16 LAB — SURGICAL PCR SCREEN
MRSA, PCR: NEGATIVE
STAPHYLOCOCCUS AUREUS: POSITIVE — AB

## 2016-08-16 NOTE — Progress Notes (Signed)
Pt. Reports that Prenisone was given to her for a trip that she took in June/July to the [place], in case her hip started really bothering her while on travel.  Pt. Reports while traveling she had an advanced resp. Virus/infection & she was told by the ED doctors that saw her that she should take the Prednisone because she had pneumonia. Pt. Reports upon return from her trip she went to see her PCP- Dr. Redmond School & he told her she didn't need to have a repeat CXR- he stated that she was clear in her lungs.  Pt. Is followed by Dr. Adora Fridge for her cardiac care. Pt. Reports that she is feeling  well today.

## 2016-08-16 NOTE — H&P (Signed)
TOTAL HIP ADMISSION H&P  Patient is admitted for right total hip arthroplasty.  Subjective:  Chief Complaint: right hip pain  HPI: [name] Long, 76 y.o. female, has a history of pain and functional disability in the right hip(s) due to arthritis and patient has failed non-surgical conservative treatments for greater than 12 weeks to include NSAID's and/or analgesics, corticosteriod injections, flexibility and strengthening excercises, use of assistive devices, weight reduction as appropriate and activity modification.  Onset of symptoms was gradual starting 5 years ago with gradually worsening course since that time.The patient noted no past surgery on the right hip(s).  Patient currently rates pain in the right hip at 10 out of 10 with activity. Patient has night pain, worsening of pain with activity and weight bearing, trendelenberg gait, pain that interfers with activities of daily living, pain with passive range of motion and crepitus. Patient has evidence of subchondral cysts, subchondral sclerosis, periarticular osteophytes and joint space narrowing by imaging studies. This condition presents safety issues increasing the risk of falls. There is no current active infection.  Patient Active Problem List   Diagnosis Date Noted  . Arthritis of right hip 05/01/2016  . Lumbar radiculopathy, right 03/06/2016  . Personal history of colonic polyp - adenoma 08/05/2013  . GERD (gastroesophageal reflux disease) 03/11/2013  . Rheumatoid arthritis ([place]) 03/11/2013  . High frequency hearing loss 03/11/2013  . Tinnitus 03/11/2013  . Hyperlipidemia 08/21/2012  . S/P CABG x 3: (lima-lad,svg-om,svg-rca) 01/16/2011  . CAD (coronary artery disease): multivessel, good LVF 50% at cath 01/16/2011  . Back pain, chronic--plans were for cervical disc surgery week of 01/16/11 01/12/2011  . Asthma, chronic 01/12/2011  . Fibromyalgia 01/12/2011   Past Medical History:  Diagnosis Date  . Abnormal EKG 01/12/2011  .  Allergy    RHINITIS  . Arthritis   . Asthma   . Asthma 01/12/2011  . Back pain, chronic--plans were for cervical disc surgery week of 01/16/11 01/12/2011  . Blood clot in vein 2004   L leg  . Blood transfusion without reported diagnosis 2004  . Chronic low back pain   . Complication of anesthesia   . Connective tissue disease ([place])   . Coronary artery disease   . Crescendo angina, with EKG changes, new. 01/12/2011  . Dyspnea   . Fibromyalgia   . Fibromyalgia   . Headache    migraine - several in a yr.   . History of stress test 04/2011   Normal stress test  . Hx of echocardiogram 07/19/2011   EF 50-55%. There is mild annular calcification, there is trace migtral regurgitation, there is trace tricupid regurgitation, pericardium is thick, there is no pericardial effusion.  . Hypertension   . Myocardial infarction [facility]) 2013   triple heart bypass  . Neuromuscular disorder (HCC)    fibromyalgia   . Osteoporosis    OSTEOPENIA  . Personal history of colonic polyp - adenoma 08/05/2013   08/2013 - 7 mm adenoma - consider repeat colonoscopy 2020 (age 89)  . Pneumonia    numerous times, only admitted one time for pneumonia but she has had pneumonia numerous times  . PONV (postoperative nausea and vomiting)   . PPD positive   . Status post coronary artery bypass grafting   . Urinary incontinence    post op, "sleepy bladder", urinar retention     Past Surgical History:  Procedure Laterality Date  . ABDOMINAL HYSTERECTOMY  1981  . APPENDECTOMY  1946  . BLADDER REPAIR  1985  .  BREAST SURGERY  1992   REDUCTION, x3 surgeries overall on breast for augmentation   . CARDIAC CATHETERIZATION  2013  . CARPAL TUNNEL RELEASE Bilateral 1988, 1999  . [place]  . CORONARY ARTERY BYPASS GRAFT  01/14/2011   Procedure: CORONARY ARTERY BYPASS GRAFTING (CABG);  Surgeon: Tharon Aquas Adelene Idler, MD;  Location: [place];  Service: Open Heart Surgery;  Laterality: N/A;  CABG x three, using  right greater saphenous vein harvested endoscopically  . EYE SURGERY Bilateral 1985   radiokerototomy  . LEFT HEART CATHETERIZATION WITH CORONARY ANGIOGRAM N/A 01/13/2011   Procedure: LEFT HEART CATHETERIZATION WITH CORONARY ANGIOGRAM;  Surgeon: Troy Sine, MD;  Location: [facility] CATH LAB;  Service: Cardiovascular;  Laterality: N/A;  . PUBOVAGINAL SLING  2002  . STERNAL WOUND DEBRIDEMENT  07/27/2011   Procedure: STERNAL WOUND DEBRIDEMENT;  followed by wound vac Surgeon: Ivin Poot, MD;  Location: [place];  Service: Open Heart Surgery;  Laterality: N/A;  . TONSILLECTOMY  1952  . TRIGGER FINGER RELEASE Right 2014   4th finger  . TROCHANTERIC BURSA EXCISION Right    bursa removed, R hip  . TUBAL LIGATION      No prescriptions prior to admission.   Allergies  Allergen Reactions  . Morphine And Related Nausea And Vomiting  . Percocet [Oxycodone-Acetaminophen] Other (See Comments)  . Zolpidem Other (See Comments)    Unusual behavior    Social History  Substance Use Topics  . Smoking status: Former Smoker    Quit date: 01/12/1980  . Smokeless tobacco: Never Used  . Alcohol use No    Family History  Problem Relation Age of Onset  . Adopted: Yes  . Hypertension Brother      ROS  Objective:  Physical Exam  Vital signs in last 24 hours: Temp:  [98.3 F (36.8 C)] 98.3 F (36.8 C) (08/15 0804) Pulse Rate:  [69] 69 (08/15 0804) Resp:  [18] 18 (08/15 0804) BP: (119)/(92) 119/92 (08/15 0804) SpO2:  [97 %] 97 % (08/15 0804) Weight:  [81.7 kg (180 lb 3.2 oz)] 81.7 kg (180 lb 3.2 oz) (08/15 0804)  Labs:   Estimated body mass index is 30.93 kg/m as calculated from the following:   Height as of 08/16/16: 5\' 4"  (1.626 m).   Weight as of 08/16/16: 81.7 kg (180 lb 3.2 oz).   Imaging Review Plain radiographs demonstrate severe degenerative joint disease of the right hip(s). The bone quality appears to be good for age and reported activity level.  Assessment/Plan:  End stage  primary arthritis, right hip(s)  The patient history, physical examination, clinical judgement of the provider and imaging studies are consistent with end stage degenerative joint disease of the right hip(s) and total hip arthroplasty is deemed medically necessary. The treatment options including medical management, injection therapy, arthroscopy and arthroplasty were discussed at length. The risks and benefits of total hip arthroplasty were presented and reviewed. The risks due to aseptic loosening, infection, stiffness, dislocation/subluxation,  thromboembolic complications and other imponderables were discussed.  The patient acknowledged the explanation, agreed to proceed with the plan and consent was signed. Patient is being admitted for inpatient treatment for surgery, pain control, PT, OT, prophylactic antibiotics, VTE prophylaxis, progressive ambulation and ADL's and discharge planning.The patient is planning to be discharged home with home health services

## 2016-08-17 NOTE — Progress Notes (Signed)
Anesthesia Chart Review:  Pt is a 76 year old female scheduled for R total hip arthroplasty anterior approach on 08/22/2016 Melrose Nakayama, MD  - PCP is Jill Alexanders, MD - Cardiologist is Quay Burow, MD  PMH includes:  CAD (s/p CABG 01/14/11), HTN, DVT (2004), asthma, post-op N/V, GERD.  Former smoker. BMI 31  Medications include: albuterol, ASA 81mg , soma, advair, plaquenil  Preoperative labs reviewed.  - UA consistent with UTI. I notified Juliann Pulse in Dr. Jerald Kief office.   CXR 11/29/15: No active cardiopulmonary disease.  EKG 11/29/15: NSR. ST & T wave abnormality, consider inferior ischemia. ST & T wave abnormality, consider anterolateral ischemia. Appears stable when compared with EKG 05/11/14.   Echo 01/06/16:  - Left ventricle: The cavity size was normal. There was mild focal basal hypertrophy of the septum. Systolic function was normal. The estimated ejection fraction was in the range of 60% to 65%. Wall motion was normal; there were no regional wall motion abnormalities. Left ventricular diastolic function parameters were normal. - Aortic valve: Transvalvular velocity was within the normal range. There was no stenosis. There was no regurgitation. - Mitral valve: Calcified annulus. Transvalvular velocity was within the normal range. There was no evidence for stenosis. There was mild regurgitation. - Left atrium: The atrium was mildly dilated. - Right ventricle: The cavity size was normal. Wall thickness was normal. Systolic function was normal. - Atrial septum: No defect or patent foramen ovale was identified by color flow Doppler. - Tricuspid valve: There was trivial regurgitation.  Nuclear stress test 12/28/15:   The left ventricular ejection fraction is normal (55-65%).  Nuclear stress EF: 57%.  Blood pressure demonstrated a normal response to exercise.  There was no ST segment deviation noted during stress.  The study is normal.  This is a low risk study.  Carotid  duplex 01/13/11:  - No significant extracranial carotid artery stenosis demonstrated.  - Vertebrals are patent with antegrade flow.  If no changes, I anticipate pt can proceed with surgery as scheduled.   Willeen Cass, FNP-BC [facility] Short Stay Surgical Center/Anesthesiology Phone: [phone] 08/17/2016 12:46 PM

## 2016-08-20 ENCOUNTER — Other Ambulatory Visit: Payer: Self-pay | Admitting: Family Medicine

## 2016-08-21 MED ORDER — CEFAZOLIN SODIUM-DEXTROSE 2-4 GM/100ML-% IV SOLN
2.0000 g | INTRAVENOUS | Status: AC
  Administered 2016-08-22: 2 g via INTRAVENOUS
  Filled 2016-08-21: qty 100

## 2016-08-21 MED ORDER — TRANEXAMIC ACID 1000 MG/10ML IV SOLN
2000.0000 mg | INTRAVENOUS | Status: AC
  Administered 2016-08-22: 2000 mg via TOPICAL
  Filled 2016-08-21: qty 20

## 2016-08-22 ENCOUNTER — Encounter (HOSPITAL_COMMUNITY)
Admission: RE | Disposition: A | Discharge status: Home Health | Payer: Self-pay | Source: Ambulatory Visit | Attending: Orthopaedic Surgery

## 2016-08-22 ENCOUNTER — Inpatient Hospital Stay (HOSPITAL_COMMUNITY)
Admission: RE | Admit: 2016-08-22 | Discharge: 2016-08-25 | DRG: 470 | Disposition: A | Discharge status: Home Health | Payer: PPO | Source: Ambulatory Visit | Attending: Orthopaedic Surgery | Admitting: Orthopaedic Surgery

## 2016-08-22 ENCOUNTER — Inpatient Hospital Stay (HOSPITAL_COMMUNITY): Payer: PPO

## 2016-08-22 ENCOUNTER — Inpatient Hospital Stay (HOSPITAL_COMMUNITY): Payer: PPO | Admitting: Emergency Medicine

## 2016-08-22 ENCOUNTER — Inpatient Hospital Stay (HOSPITAL_COMMUNITY): Payer: PPO | Admitting: Anesthesiology

## 2016-08-22 ENCOUNTER — Encounter (HOSPITAL_COMMUNITY): Payer: Self-pay | Admitting: Urology

## 2016-08-22 DIAGNOSIS — J310 Chronic rhinitis: Secondary | ICD-10-CM | POA: Diagnosis not present

## 2016-08-22 DIAGNOSIS — Z90710 Acquired absence of both cervix and uterus: Secondary | ICD-10-CM | POA: Diagnosis not present

## 2016-08-22 DIAGNOSIS — Z87891 Personal history of nicotine dependence: Secondary | ICD-10-CM

## 2016-08-22 DIAGNOSIS — Z96641 Presence of right artificial hip joint: Secondary | ICD-10-CM | POA: Diagnosis not present

## 2016-08-22 DIAGNOSIS — Z951 Presence of aortocoronary bypass graft: Secondary | ICD-10-CM

## 2016-08-22 DIAGNOSIS — I10 Essential (primary) hypertension: Secondary | ICD-10-CM | POA: Diagnosis present

## 2016-08-22 DIAGNOSIS — Z981 Arthrodesis status: Secondary | ICD-10-CM

## 2016-08-22 DIAGNOSIS — M810 Age-related osteoporosis without current pathological fracture: Secondary | ICD-10-CM | POA: Diagnosis present

## 2016-08-22 DIAGNOSIS — Z888 Allergy status to other drugs, medicaments and biological substances status: Secondary | ICD-10-CM | POA: Diagnosis not present

## 2016-08-22 DIAGNOSIS — M1611 Unilateral primary osteoarthritis, right hip: Principal | ICD-10-CM | POA: Diagnosis present

## 2016-08-22 DIAGNOSIS — I2510 Atherosclerotic heart disease of native coronary artery without angina pectoris: Secondary | ICD-10-CM | POA: Diagnosis not present

## 2016-08-22 DIAGNOSIS — M797 Fibromyalgia: Secondary | ICD-10-CM | POA: Diagnosis present

## 2016-08-22 DIAGNOSIS — H9190 Unspecified hearing loss, unspecified ear: Secondary | ICD-10-CM | POA: Diagnosis present

## 2016-08-22 DIAGNOSIS — Z885 Allergy status to narcotic agent status: Secondary | ICD-10-CM | POA: Diagnosis not present

## 2016-08-22 DIAGNOSIS — M069 Rheumatoid arthritis, unspecified: Secondary | ICD-10-CM | POA: Diagnosis not present

## 2016-08-22 DIAGNOSIS — K219 Gastro-esophageal reflux disease without esophagitis: Secondary | ICD-10-CM | POA: Diagnosis present

## 2016-08-22 DIAGNOSIS — Z471 Aftercare following joint replacement surgery: Secondary | ICD-10-CM | POA: Diagnosis not present

## 2016-08-22 DIAGNOSIS — I252 Old myocardial infarction: Secondary | ICD-10-CM

## 2016-08-22 DIAGNOSIS — Z419 Encounter for procedure for purposes other than remedying health state, unspecified: Secondary | ICD-10-CM

## 2016-08-22 DIAGNOSIS — E785 Hyperlipidemia, unspecified: Secondary | ICD-10-CM | POA: Diagnosis not present

## 2016-08-22 DIAGNOSIS — Z86010 Personal history of colonic polyps: Secondary | ICD-10-CM | POA: Diagnosis not present

## 2016-08-22 DIAGNOSIS — M25551 Pain in right hip: Secondary | ICD-10-CM | POA: Diagnosis present

## 2016-08-22 HISTORY — PX: TOTAL HIP ARTHROPLASTY: SHX124

## 2016-08-22 SURGERY — ARTHROPLASTY, HIP, TOTAL, ANTERIOR APPROACH
Anesthesia: Spinal | Laterality: Right

## 2016-08-22 MED ORDER — MIDAZOLAM HCL 2 MG/2ML IJ SOLN
INTRAMUSCULAR | Status: AC
  Filled 2016-08-22: qty 2

## 2016-08-22 MED ORDER — ONDANSETRON HCL 4 MG/2ML IJ SOLN
4.0000 mg | Freq: Four times a day (QID) | INTRAMUSCULAR | Status: DC | PRN
  Administered 2016-08-22: 4 mg via INTRAVENOUS
  Filled 2016-08-22 (×2): qty 2

## 2016-08-22 MED ORDER — ACETAMINOPHEN 325 MG PO TABS
650.0000 mg | ORAL_TABLET | Freq: Four times a day (QID) | ORAL | Status: DC | PRN
  Administered 2016-08-22 – 2016-08-24 (×4): 650 mg via ORAL
  Filled 2016-08-22 (×4): qty 2

## 2016-08-22 MED ORDER — MIDAZOLAM HCL 5 MG/5ML IJ SOLN
INTRAMUSCULAR | Status: DC | PRN
  Administered 2016-08-22: 2 mg via INTRAVENOUS

## 2016-08-22 MED ORDER — CEFAZOLIN SODIUM-DEXTROSE 2-4 GM/100ML-% IV SOLN
2.0000 g | Freq: Four times a day (QID) | INTRAVENOUS | Status: AC
Start: 2016-08-22 — End: 2016-08-23
  Administered 2016-08-22: 2 g via INTRAVENOUS
  Filled 2016-08-22 (×2): qty 100

## 2016-08-22 MED ORDER — PROPOFOL 500 MG/50ML IV EMUL
INTRAVENOUS | Status: DC | PRN
  Administered 2016-08-22: 100 ug/kg/min via INTRAVENOUS

## 2016-08-22 MED ORDER — METOPROLOL TARTRATE 25 MG PO TABS
25.0000 mg | ORAL_TABLET | Freq: Two times a day (BID) | ORAL | Status: DC
  Administered 2016-08-22 – 2016-08-25 (×5): 25 mg via ORAL
  Filled 2016-08-22 (×6): qty 1

## 2016-08-22 MED ORDER — HYDROMORPHONE HCL 1 MG/ML IJ SOLN
0.5000 mg | INTRAMUSCULAR | Status: DC | PRN
  Administered 2016-08-22 – 2016-08-23 (×7): 1 mg via INTRAVENOUS
  Filled 2016-08-22 (×8): qty 1

## 2016-08-22 MED ORDER — METOCLOPRAMIDE HCL 5 MG PO TABS
5.0000 mg | ORAL_TABLET | Freq: Three times a day (TID) | ORAL | Status: DC | PRN
Start: 2016-08-22 — End: 2016-08-25

## 2016-08-22 MED ORDER — BUTALBITAL-APAP-CAFFEINE 50-325-40 MG PO TABS: 1.0000 | ORAL_TABLET | Freq: Four times a day (QID) | ORAL | Status: DC | PRN

## 2016-08-22 MED ORDER — ONDANSETRON HCL 4 MG PO TABS
4.0000 mg | ORAL_TABLET | Freq: Four times a day (QID) | ORAL | Status: DC | PRN
  Administered 2016-08-22: 4 mg via ORAL
  Filled 2016-08-22: qty 1

## 2016-08-22 MED ORDER — ACETAMINOPHEN 650 MG RE SUPP: 650.0000 mg | Freq: Four times a day (QID) | RECTAL | Status: DC | PRN

## 2016-08-22 MED ORDER — DOCUSATE SODIUM 100 MG PO CAPS
100.0000 mg | ORAL_CAPSULE | Freq: Two times a day (BID) | ORAL | Status: DC
  Administered 2016-08-22 – 2016-08-25 (×6): 100 mg via ORAL
  Filled 2016-08-22 (×6): qty 1

## 2016-08-22 MED ORDER — ATORVASTATIN CALCIUM 20 MG PO TABS
20.0000 mg | ORAL_TABLET | Freq: Every day | ORAL | Status: DC
  Administered 2016-08-22 – 2016-08-24 (×3): 20 mg via ORAL
  Filled 2016-08-22 (×3): qty 1

## 2016-08-22 MED ORDER — BUPIVACAINE HCL (PF) 0.75 % IJ SOLN
INTRAMUSCULAR | Status: DC | PRN
  Administered 2016-08-22: 1.8 mL via INTRATHECAL

## 2016-08-22 MED ORDER — HYDROMORPHONE HCL 1 MG/ML IJ SOLN
INTRAMUSCULAR | Status: AC
  Administered 2016-08-22: 0.5 mg via INTRAVENOUS
  Filled 2016-08-22: qty 1

## 2016-08-22 MED ORDER — CHLORHEXIDINE GLUCONATE 4 % EX LIQD: 60.0000 mL | Freq: Once | CUTANEOUS | Status: DC

## 2016-08-22 MED ORDER — OXYCODONE HCL 5 MG PO TABS: 5.0000 mg | ORAL_TABLET | Freq: Once | ORAL | Status: DC | PRN

## 2016-08-22 MED ORDER — SODIUM CHLORIDE 0.9 % IJ SOLN
INTRAMUSCULAR | Status: DC | PRN
  Administered 2016-08-22: 10 mL

## 2016-08-22 MED ORDER — LACTATED RINGERS IV SOLN
INTRAVENOUS | Status: DC
  Administered 2016-08-22: 07:00:00 via INTRAVENOUS

## 2016-08-22 MED ORDER — BISACODYL 5 MG PO TBEC: 5.0000 mg | DELAYED_RELEASE_TABLET | Freq: Every day | ORAL | Status: DC | PRN

## 2016-08-22 MED ORDER — PHENOL 1.4 % MT LIQD: 1.0000 | OROMUCOSAL | Status: DC | PRN

## 2016-08-22 MED ORDER — OXYCODONE HCL 5 MG/5ML PO SOLN: 5.0000 mg | Freq: Once | ORAL | Status: DC | PRN

## 2016-08-22 MED ORDER — LACTATED RINGERS IV SOLN
INTRAVENOUS | Status: DC
  Administered 2016-08-23: 02:00:00 via INTRAVENOUS

## 2016-08-22 MED ORDER — SODIUM CHLORIDE 0.9 % IV SOLN
1000.0000 mg | Freq: Once | INTRAVENOUS | Status: AC
  Administered 2016-08-22: 1000 mg via INTRAVENOUS
  Filled 2016-08-22: qty 10

## 2016-08-22 MED ORDER — METHOCARBAMOL 500 MG PO TABS
500.0000 mg | ORAL_TABLET | Freq: Four times a day (QID) | ORAL | Status: DC | PRN
  Administered 2016-08-22 – 2016-08-25 (×7): 500 mg via ORAL
  Filled 2016-08-22 (×8): qty 1

## 2016-08-22 MED ORDER — PHENYLEPHRINE HCL 10 MG/ML IJ SOLN
INTRAVENOUS | Status: DC | PRN
  Administered 2016-08-22: 25 ug/min via INTRAVENOUS

## 2016-08-22 MED ORDER — PROPOFOL 10 MG/ML IV BOLUS
INTRAVENOUS | Status: AC
  Filled 2016-08-22: qty 20

## 2016-08-22 MED ORDER — 0.9 % SODIUM CHLORIDE (POUR BTL) OPTIME
TOPICAL | Status: DC | PRN
  Administered 2016-08-22: 1000 mL

## 2016-08-22 MED ORDER — METHOCARBAMOL 1000 MG/10ML IJ SOLN: 500.0000 mg | Freq: Four times a day (QID) | INTRAMUSCULAR | Status: DC | PRN

## 2016-08-22 MED ORDER — GABAPENTIN 600 MG PO TABS: 1800.0000 mg | ORAL_TABLET | Freq: Two times a day (BID) | ORAL | Status: DC

## 2016-08-22 MED ORDER — FENTANYL CITRATE (PF) 250 MCG/5ML IJ SOLN
INTRAMUSCULAR | Status: AC
  Filled 2016-08-22: qty 5

## 2016-08-22 MED ORDER — BUPIVACAINE LIPOSOME 1.3 % IJ SUSP
20.0000 mL | Freq: Once | INTRAMUSCULAR | Status: AC
  Administered 2016-08-22: 20 mL
  Filled 2016-08-22 (×2): qty 20

## 2016-08-22 MED ORDER — DIPHENHYDRAMINE HCL 12.5 MG/5ML PO ELIX
12.5000 mg | ORAL_SOLUTION | ORAL | Status: DC | PRN
Start: 2016-08-22 — End: 2016-08-25

## 2016-08-22 MED ORDER — ASPIRIN EC 325 MG PO TBEC
325.0000 mg | DELAYED_RELEASE_TABLET | Freq: Two times a day (BID) | ORAL | Status: DC
  Administered 2016-08-23 – 2016-08-25 (×5): 325 mg via ORAL
  Filled 2016-08-22 (×5): qty 1

## 2016-08-22 MED ORDER — TAMSULOSIN HCL 0.4 MG PO CAPS
0.4000 mg | ORAL_CAPSULE | Freq: Every day | ORAL | Status: DC
  Administered 2016-08-23 – 2016-08-25 (×3): 0.4 mg via ORAL
  Filled 2016-08-22 (×3): qty 1

## 2016-08-22 MED ORDER — BUPIVACAINE-EPINEPHRINE (PF) 0.5% -1:200000 IJ SOLN
INTRAMUSCULAR | Status: AC
  Filled 2016-08-22: qty 30

## 2016-08-22 MED ORDER — ALBUTEROL SULFATE (2.5 MG/3ML) 0.083% IN NEBU: 3.0000 mL | INHALATION_SOLUTION | Freq: Four times a day (QID) | RESPIRATORY_TRACT | Status: DC | PRN

## 2016-08-22 MED ORDER — HYDROMORPHONE HCL 1 MG/ML IJ SOLN
0.2500 mg | INTRAMUSCULAR | Status: DC | PRN
  Administered 2016-08-22 (×2): 0.5 mg via INTRAVENOUS

## 2016-08-22 MED ORDER — ALUM & MAG HYDROXIDE-SIMETH 200-200-20 MG/5ML PO SUSP: 30.0000 mL | ORAL | Status: DC | PRN

## 2016-08-22 MED ORDER — METOCLOPRAMIDE HCL 5 MG/ML IJ SOLN: 5.0000 mg | Freq: Three times a day (TID) | INTRAMUSCULAR | Status: DC | PRN

## 2016-08-22 MED ORDER — HYDROMORPHONE HCL 2 MG PO TABS
4.0000 mg | ORAL_TABLET | ORAL | Status: DC | PRN
  Administered 2016-08-23 – 2016-08-25 (×9): 4 mg via ORAL
  Filled 2016-08-22 (×10): qty 2

## 2016-08-22 MED ORDER — GABAPENTIN 600 MG PO TABS
1800.0000 mg | ORAL_TABLET | Freq: Two times a day (BID) | ORAL | Status: DC
  Administered 2016-08-22 – 2016-08-24 (×5): 1800 mg via ORAL
  Filled 2016-08-22 (×5): qty 3

## 2016-08-22 MED ORDER — MENTHOL 3 MG MT LOZG: 1.0000 | LOZENGE | OROMUCOSAL | Status: DC | PRN

## 2016-08-22 MED ORDER — HYDROXYCHLOROQUINE SULFATE 200 MG PO TABS
200.0000 mg | ORAL_TABLET | Freq: Every evening | ORAL | Status: DC
  Administered 2016-08-23 – 2016-08-24 (×2): 200 mg via ORAL
  Filled 2016-08-22 (×2): qty 1

## 2016-08-22 MED ORDER — BUPIVACAINE-EPINEPHRINE (PF) 0.5% -1:200000 IJ SOLN
INTRAMUSCULAR | Status: DC | PRN
  Administered 2016-08-22: 30 mL via PERINEURAL

## 2016-08-22 MED ORDER — CARISOPRODOL 350 MG PO TABS: 350.0000 mg | ORAL_TABLET | Freq: Two times a day (BID) | ORAL | Status: DC | PRN

## 2016-08-22 MED ORDER — ONDANSETRON HCL 4 MG/2ML IJ SOLN
INTRAMUSCULAR | Status: DC | PRN
  Administered 2016-08-22: 4 mg via INTRAVENOUS

## 2016-08-22 MED ORDER — MOMETASONE FURO-FORMOTEROL FUM 200-5 MCG/ACT IN AERO
2.0000 | INHALATION_SPRAY | Freq: Two times a day (BID) | RESPIRATORY_TRACT | Status: DC
  Administered 2016-08-22 – 2016-08-25 (×5): 2 via RESPIRATORY_TRACT
  Filled 2016-08-22 (×2): qty 8.8

## 2016-08-22 MED ORDER — PHENYLEPHRINE HCL 10 MG/ML IJ SOLN
INTRAMUSCULAR | Status: DC | PRN
  Administered 2016-08-22 (×4): 80 ug via INTRAVENOUS

## 2016-08-22 MED ORDER — TRANEXAMIC ACID 1000 MG/10ML IV SOLN
1000.0000 mg | INTRAVENOUS | Status: AC
  Administered 2016-08-22: 1000 mg via INTRAVENOUS
  Filled 2016-08-22: qty 1100

## 2016-08-22 MED ORDER — FENTANYL CITRATE (PF) 100 MCG/2ML IJ SOLN
INTRAMUSCULAR | Status: DC | PRN
  Administered 2016-08-22 (×2): 25 ug via INTRAVENOUS
  Administered 2016-08-22: 50 ug via INTRAVENOUS

## 2016-08-22 MED ORDER — MONTELUKAST SODIUM 10 MG PO TABS
10.0000 mg | ORAL_TABLET | Freq: Every day | ORAL | Status: DC
  Administered 2016-08-22 – 2016-08-24 (×3): 10 mg via ORAL
  Filled 2016-08-22 (×3): qty 1

## 2016-08-22 SURGICAL SUPPLY — 41 items
BLADE SAW SGTL 18X1.27X75 (BLADE) ×2 IMPLANT
CAPT HIP TOTAL 2 ×2 IMPLANT
CELLS DAT CNTRL 66122 CELL SVR (MISCELLANEOUS) ×1 IMPLANT
COVER PERINEAL POST (MISCELLANEOUS) ×2 IMPLANT
COVER SURGICAL LIGHT HANDLE (MISCELLANEOUS) ×2 IMPLANT
DRAPE C-ARM 42X72 X-RAY (DRAPES) ×2 IMPLANT
DRAPE STERI IOBAN 125X83 (DRAPES) ×2 IMPLANT
DRAPE U-SHAPE 47X51 STRL (DRAPES) ×6 IMPLANT
DRSG AQUACEL AG ADV 3.5X10 (GAUZE/BANDAGES/DRESSINGS) ×2 IMPLANT
DURAPREP 26ML APPLICATOR (WOUND CARE) ×2 IMPLANT
ELECT BLADE 4.0 EZ CLEAN MEGAD (MISCELLANEOUS)
ELECT REM PT RETURN 9FT ADLT (ELECTROSURGICAL) ×2
ELECTRODE BLDE 4.0 EZ CLN MEGD (MISCELLANEOUS) IMPLANT
ELECTRODE REM PT RTRN 9FT ADLT (ELECTROSURGICAL) ×1 IMPLANT
FACESHIELD WRAPAROUND (MASK) ×4 IMPLANT
GLOVE BIO SURGEON STRL SZ8 (GLOVE) ×4 IMPLANT
GLOVE BIOGEL PI IND STRL 8 (GLOVE) ×2 IMPLANT
GLOVE BIOGEL PI INDICATOR 8 (GLOVE) ×2
GOWN STRL REUS W/ TWL LRG LVL3 (GOWN DISPOSABLE) ×1 IMPLANT
GOWN STRL REUS W/ TWL XL LVL3 (GOWN DISPOSABLE) ×2 IMPLANT
GOWN STRL REUS W/TWL LRG LVL3 (GOWN DISPOSABLE) ×1
GOWN STRL REUS W/TWL XL LVL3 (GOWN DISPOSABLE) ×2
KIT BASIN OR (CUSTOM PROCEDURE TRAY) ×2 IMPLANT
KIT ROOM TURNOVER OR (KITS) ×2 IMPLANT
MANIFOLD NEPTUNE II (INSTRUMENTS) ×2 IMPLANT
NEEDLE HYPO 22GX1.5 SAFETY (NEEDLE) ×2 IMPLANT
NS IRRIG 1000ML POUR BTL (IV SOLUTION) ×2 IMPLANT
PACK TOTAL JOINT (CUSTOM PROCEDURE TRAY) ×2 IMPLANT
PAD ARMBOARD 7.5X6 YLW CONV (MISCELLANEOUS) ×2 IMPLANT
RTRCTR WOUND ALEXIS 18CM MED (MISCELLANEOUS) ×2
SUT ETHIBOND NAB CT1 #1 30IN (SUTURE) ×4 IMPLANT
SUT MNCRL AB 3-0 PS2 27 (SUTURE) ×2 IMPLANT
SUT VIC AB 1 CT1 27 (SUTURE) ×1
SUT VIC AB 1 CT1 27XBRD ANBCTR (SUTURE) ×1 IMPLANT
SUT VIC AB 2-0 CT1 27 (SUTURE) ×1
SUT VIC AB 2-0 CT1 TAPERPNT 27 (SUTURE) ×1 IMPLANT
SUT VLOC 180 0 24IN GS25 (SUTURE) ×2 IMPLANT
SYR 50ML LL SCALE MARK (SYRINGE) ×2 IMPLANT
TOWEL OR 17X24 6PK STRL BLUE (TOWEL DISPOSABLE) ×2 IMPLANT
TOWEL OR 17X26 10 PK STRL BLUE (TOWEL DISPOSABLE) ×2 IMPLANT
TRAY CATH 16FR W/PLASTIC CATH (SET/KITS/TRAYS/PACK) ×2 IMPLANT

## 2016-08-22 NOTE — Anesthesia Procedure Notes (Signed)
Spinal  Patient location during procedure: OR Start time: 08/22/2016 7:12 AM End time: 08/22/2016 7:23 AM Staffing Anesthesiologist: Rica Koyanagi Performed: anesthesiologist  Preanesthetic Checklist Completed: patient identified, site marked, surgical consent, pre-op evaluation, timeout performed, risks and benefits discussed and monitors and equipment checked Spinal Block Patient position: sitting Prep: ChloraPrep Patient monitoring: heart rate, continuous pulse ox and blood pressure Approach: right paramedian Location: L3-4 Needle Needle type: Quincke  Needle gauge: 25 G Needle length: 9 cm Needle insertion depth: 6 cm Assessment Sensory level: T8

## 2016-08-22 NOTE — Anesthesia Preprocedure Evaluation (Addendum)
Anesthesia Evaluation  Patient identified by MRN, date of birth, ID band Patient awake    Reviewed: Allergy & Precautions, NPO status , Patient's Chart, lab work & pertinent test results  History of Anesthesia Complications (+) PONV  Airway Mallampati: II  TM Distance: <3 FB Neck ROM: Full    Dental no notable dental hx.    Pulmonary shortness of breath, asthma , former smoker,    breath sounds clear to auscultation       Cardiovascular hypertension, + CAD, + Past MI and + CABG   Rhythm:Regular Rate:Normal     Neuro/Psych  Neuromuscular disease    GI/Hepatic Neg liver ROS, GERD  ,  Endo/Other    Renal/GU negative Renal ROS     Musculoskeletal  (+) Arthritis , Fibromyalgia -  Abdominal   Peds  Hematology   Anesthesia Other Findings   Reproductive/Obstetrics                            Anesthesia Physical Anesthesia Plan  ASA: III  Anesthesia Plan: Spinal   Post-op Pain Management:    Induction: Intravenous  PONV Risk Score and Plan: 3 and Ondansetron, Dexamethasone, Midazolam, Propofol infusion and Treatment may vary due to age or medical condition  Airway Management Planned: Natural Airway and Simple Face Mask  Additional Equipment:   Intra-op Plan:   Post-operative Plan:   Informed Consent: I have reviewed the patients History and Physical, chart, labs and discussed the procedure including the risks, benefits and alternatives for the proposed anesthesia with the patient or authorized representative who has indicated his/her understanding and acceptance.   Dental advisory given  Plan Discussed with: CRNA  Anesthesia Plan Comments:         Anesthesia Quick Evaluation

## 2016-08-22 NOTE — Op Note (Signed)

## 2016-08-22 NOTE — Anesthesia Postprocedure Evaluation (Signed)
Anesthesia Post Note  Patient: Sirenia H Munro  Procedure(s) Performed: Procedure(s) (LRB): TOTAL HIP ARTHROPLASTY ANTERIOR APPROACH (Right)     Patient location during evaluation: PACU Anesthesia Type: Spinal Level of consciousness: oriented and awake and alert Pain management: pain level controlled Vital Signs Assessment: post-procedure vital signs reviewed and stable Respiratory status: spontaneous breathing, respiratory function stable and patient connected to nasal cannula oxygen Cardiovascular status: blood pressure returned to baseline and stable Postop Assessment: no headache and no backache Anesthetic complications: no    Last Vitals:  Vitals:   08/22/16 1145 08/22/16 1152  BP:  (!) 139/110  Pulse: (!) 55 (!) 54  Resp: 17   Temp: 36.7 C 36.5 C  SpO2: 98% 92%    Last Pain:  Vitals:   08/22/16 1152  TempSrc: Oral  PainSc:                  Romeka Scifres,JAMES TERRILL

## 2016-08-22 NOTE — Transfer of Care (Signed)
Immediate Anesthesia Transfer of Care Note  Patient: Chauncey Cruel Whalin  Procedure(s) Performed: Procedure(s): TOTAL HIP ARTHROPLASTY ANTERIOR APPROACH (Right)  Patient Location: PACU  Anesthesia Type:MAC and Spinal  Level of Consciousness: patient cooperative and responds to stimulation  Airway & Oxygen Therapy: Patient Spontanous Breathing and Patient connected to face mask oxygen  Post-op Assessment: Report given to RN and Post -op Vital signs reviewed and stable  Post vital signs: Reviewed and stable  Last Vitals:  Vitals:   08/22/16 0545  BP: (!) 147/91  Pulse: 89  Resp: 18  Temp: 37.2 C  SpO2: 95%    Last Pain:  Vitals:   08/22/16 0619  PainSc: 7          Complications: No apparent anesthesia complications

## 2016-08-22 NOTE — Interval H&P Note (Signed)
History and Physical Interval Note:  08/22/2016 6:51 AM  [name] Long  has presented today for surgery, with the diagnosis of RIGHT HIP DEGENERATIVE JOINT DISEASE  The various methods of treatment have been discussed with the patient and family. After consideration of risks, benefits and other options for treatment, the patient has consented to  Procedure(s): TOTAL HIP ARTHROPLASTY ANTERIOR APPROACH (Right) as a surgical intervention .  The patient's history has been reviewed, patient examined, no change in status, stable for surgery.  I have reviewed the patient's chart and labs.  Questions were answered to the patient's satisfaction.     Doylene Splinter G

## 2016-08-23 ENCOUNTER — Encounter (HOSPITAL_COMMUNITY): Payer: Self-pay | Admitting: Orthopaedic Surgery

## 2016-08-23 NOTE — Progress Notes (Signed)
Subjective: 1 Day Post-Op Procedure(s) (LRB): TOTAL HIP ARTHROPLASTY ANTERIOR APPROACH (Right)  Activity level:  wbat Diet tolerance:  ok Voiding:  ok Patient reports pain as moderate.    Objective: Vital signs in last 24 hours: Temp:  [97.2 F (36.2 C)-99.4 F (37.4 C)] 98.5 F (36.9 C) (08/22 0512) Pulse Rate:  [48-76] 66 (08/22 0512) Resp:  [10-20] 17 (08/22 0512) BP: (71-139)/(42-110) 111/59 (08/22 0512) SpO2:  [92 %-100 %] 94 % (08/22 0512)  Labs: No results for input(s): HGB in the last 72 hours. No results for input(s): WBC, RBC, HCT, PLT in the last 72 hours. No results for input(s): NA, K, CL, CO2, BUN, CREATININE, GLUCOSE, CALCIUM in the last 72 hours. No results for input(s): LABPT, INR in the last 72 hours.  Physical Exam:  Neurologically intact ABD soft Neurovascular intact Sensation intact distally Intact pulses distally Dorsiflexion/Plantar flexion intact Incision: dressing C/D/I and no drainage No cellulitis present Compartment soft  Assessment/Plan:  1 Day Post-Op Procedure(s) (LRB): TOTAL HIP ARTHROPLASTY ANTERIOR APPROACH (Right) Advance diet Up with therapy  Foley out this morning but we will keep a close eye on urinary retention as patient has had problems with this in the past. Continue on ASA 325mg  BID x 4 weeks post op. Continue current pain meds. Follow up in office 2 weeks post op. Hopefully home tomorrow if doing well or if not then we will try for Friday.  [name] Long, [name] Long 08/23/2016, 7:40 AM

## 2016-08-23 NOTE — Progress Notes (Signed)
Temp. 102.5 at 2104 and 102.9 at 2200, tylenol given, will continue to monitor.

## 2016-08-23 NOTE — Care Management Note (Signed)
Case Management Note  Patient Details  Name: [name] Long MRN: [id number] Date of Birth: 1940-01-16  Subjective/Objective:                    Action/Plan:  Confirmed face sheet information with patient . Patient already has walker at home.   Choice offered patient would like [facility], Referral given to [facility] Expected Discharge Date:                  Expected Discharge Plan:  [place]  In-House Referral:     Discharge planning Services  CM Consult  Post Acute Care Choice:  Durable Medical Equipment, Home Health Choice offered to:  Patient  DME Arranged:  3-N-1 DME Agency:  [place]:  PT [place]:  [place]  Status of Service:  Completed, signed off  If discussed at [place] of Stay Meetings, dates discussed:    Additional Comments:  Marilu Favre, RN 08/23/2016, 2:41 PM

## 2016-08-23 NOTE — Progress Notes (Signed)
Pt has been up to [facility] several times with staff or husband assist, pt denies having anymore dizziness as she did with therapy earlier today.

## 2016-08-23 NOTE — Progress Notes (Signed)
Physical Therapy Treatment Patient Details Name: [name] Long MRN: [id number] DOB: 1940-11-14 Today's Date: 08/23/2016    History of Present Illness Patient is a 76 y/o female admitted for R direct anterior THA.  She has PMH positive for tinnitis and vertigo, CABG, cervical fusion, bladder repair, RA, GERD and fibromyalgia.     PT Comments    Patient progressing with mobility able to walk in room with assist with walker this pm.  Agree with d/c home with spouse assist but likely to need a day or two prior to d/c.  PT to follow.  Follow Up Recommendations  DC plan and follow up therapy as arranged by surgeon     Equipment Recommendations  None recommended by PT    Recommendations for Other Services       Precautions / Restrictions Precautions Precautions: Fall Precaution Comments: watch BP (mildly orthostatic)    Mobility  Bed Mobility Overal bed mobility: Needs Assistance Bed Mobility: Sit to Supine       Sit to supine: Mod assist   General bed mobility comments: slow and painful with cues for technique, assist for R LE into bed, for scooting to middle of bed and righting trunk  Transfers Overall transfer level: Needs assistance Equipment used: Rolling walker (2 wheeled) Transfers: Sit to/from Stand Sit to Stand: Min assist         General transfer comment: cues for hand placement, assist for balance  Ambulation/Gait Ambulation/Gait assistance: Mod assist Ambulation Distance (Feet): 12 Feet Assistive device: Rolling walker (2 wheeled) Gait Pattern/deviations: Step-to pattern;Decreased stride length;Trunk flexed;Shuffle     General Gait Details: assist at times and cues for improving R foot progression, assist for balance with fatigue and with continued c/o feeling syncopal, cues for breathing throughout   Stairs            Wheelchair Mobility    Modified Rankin (Stroke Patients Only)       Balance Overall balance assessment: Needs  assistance Sitting-balance support: Single extremity supported Sitting balance-Leahy Scale: Fair Sitting balance - Comments: improved upright posture   Standing balance support: Bilateral upper extremity supported Standing balance-Leahy Scale: Poor                              Cognition Arousal/Alertness: Awake/alert Behavior During Therapy: WFL for tasks assessed/performed Overall Cognitive Status: Within Functional Limits for tasks assessed                                        Exercises      General Comments        Pertinent Vitals/Pain Pain Assessment: Faces Faces Pain Scale: Hurts even more Pain Location: R hip with mobility Pain Descriptors / Indicators: Operative site guarding;Discomfort;Tender Pain Intervention(s): Monitored during session;Limited activity within patient's tolerance;Repositioned    Home Living                      Prior Function            PT Goals (current goals can now be found in the care plan section) Progress towards PT goals: Progressing toward goals    Frequency           PT Plan Current plan remains appropriate    Co-evaluation  AM-PAC PT "6 Clicks" Daily Activity  Outcome Measure  Difficulty turning over in bed (including adjusting bedclothes, sheets and blankets)?: Unable Difficulty moving from lying on back to sitting on the side of the bed? : Unable Difficulty sitting down on and standing up from a chair with arms (e.g., wheelchair, bedside commode, etc,.)?: Unable Help needed moving to and from a bed to chair (including a wheelchair)?: A Little Help needed walking in hospital room?: A Little Help needed climbing 3-5 steps with a railing? : A Lot 6 Click Score: 11    End of Session Equipment Utilized During Treatment: Gait belt Activity Tolerance: Patient limited by fatigue Patient left: in bed;with call bell/phone within reach;with family/visitor present    PT Visit Diagnosis: Difficulty in walking, not elsewhere classified (R26.2);Pain Pain - Right/Left: Right     Time: 7544-9201 PT Time Calculation (min) (ACUTE ONLY): 22 min  Charges:  $Gait Training: 8-22 mins                    G CodesMagda Kiel, [place] [phone] 08/23/2016    Reginia Naas 08/23/2016, 4:08 PM

## 2016-08-23 NOTE — Evaluation (Signed)
Physical Therapy Evaluation Patient Details Name: [name] Long MRN: [id number] DOB: 17-Sep-1940 Today's Date: 08/23/2016   History of Present Illness  Patient is a 76 y/o female admitted for R direct anterior THA.  She has PMH positive for tinnitis and vertigo, CABG, cervical fusion, bladder repair, RA, GERD and fibromyalgia.   Clinical Impression  Patient presents with decreased mobility due to deficits listed in PT problem list.  This session limited by pain, pre-syncope and some low BP's.  Able to get to chair only.  Feel she should progress to tolerate ambulation and d/c home with family support.  Will follow acutely and recommend follow up PT as noted below.     Follow Up Recommendations DC plan and follow up therapy as arranged by surgeon    Equipment Recommendations  None recommended by PT    Recommendations for Other Services       Precautions / Restrictions Precautions Precautions: Fall Precaution Comments: watch BP (mildly orthostatic)      Mobility  Bed Mobility Overal bed mobility: Needs Assistance Bed Mobility: Supine to Sit     Supine to sit: Mod assist     General bed mobility comments: cues for technique, assits to scoot hips and increased time and assist to lift trunk  Transfers Overall transfer level: Needs assistance Equipment used: Rolling walker (2 wheeled) Transfers: Sit to/from Stand Sit to Stand: Mod assist         General transfer comment: lifting assist and cues for technique, performed x 2 due to light headed BP 115/58 sitting EOB, then 107/52 after to chair  Ambulation/Gait                Stairs            Wheelchair Mobility    Modified Rankin (Stroke Patients Only)       Balance Overall balance assessment: Needs assistance Sitting-balance support: Single extremity supported Sitting balance-Leahy Scale: Poor Sitting balance - Comments: leaning onto L UE due to pain R hip   Standing balance support: Bilateral upper  extremity supported Standing balance-Leahy Scale: Poor Standing balance comment: UE support and min A for balance                             Pertinent Vitals/Pain Pain Assessment: 0-10 Pain Score: 8  Pain Location: R hip to foot Pain Descriptors / Indicators: Aching Pain Intervention(s): Monitored during session;Repositioned    Home Living Family/patient expects to be discharged to:: Private residence Living Arrangements: Spouse/significant other Available Help at Discharge: Family;Available 24 hours/day Type of Home: House Home Access: Stairs to enter Entrance Stairs-Rails: None Entrance Stairs-Number of Steps: 4 Home Layout: Two level;1/2 bath on main level Home Equipment: Walker - 2 wheels;Bedside commode;Grab bars - tub/shower;Shower seat - built in;Hand held shower head (BSC delivered in room)      Prior Function Level of Independence: Independent;Independent with assistive device(s)         Comments: used cane      Hand Dominance   Dominant Hand: Right    Extremity/Trunk Assessment   Upper Extremity Assessment Upper Extremity Assessment: Defer to OT evaluation    Lower Extremity Assessment Lower Extremity Assessment: RLE deficits/detail RLE Deficits / Details: AAROM limited to about 60 degrees hip flexion in supine, strength quads 2+/5, ankle DF WFL       Communication   Communication: No difficulties  Cognition Arousal/Alertness: Awake/alert Behavior During Therapy: WFL for tasks  assessed/performed Overall Cognitive Status: Within Functional Limits for tasks assessed                                        General Comments      Exercises Total Joint Exercises Ankle Circles/Pumps: AROM;Both;10 reps;Supine Quad Sets: AROM;Both;10 reps;Supine Heel Slides: AAROM;Right;10 reps;Supine;Left;AROM Hip ABduction/ADduction: AAROM;Right;10 reps;Supine   Assessment/Plan    PT Assessment Patient needs continued PT services  PT  Problem List Decreased range of motion;Decreased strength;Decreased mobility;Decreased activity tolerance;Decreased balance;Decreased knowledge of use of DME;Pain       PT Treatment Interventions DME instruction;Gait training;Balance training;Stair training;Functional mobility training;Therapeutic exercise;Patient/family education;Therapeutic activities    PT Goals (Current goals can be found in the Care Plan section)  Acute Rehab PT Goals Patient Stated Goal: To return home  PT Goal Formulation: With patient Time For Goal Achievement: 08/30/16 Potential to Achieve Goals: Good    Frequency 7X/week   Barriers to discharge        Co-evaluation               AM-PAC PT "6 Clicks" Daily Activity  Outcome Measure Difficulty turning over in bed (including adjusting bedclothes, sheets and blankets)?: Unable Difficulty moving from lying on back to sitting on the side of the bed? : Unable Difficulty sitting down on and standing up from a chair with arms (e.g., wheelchair, bedside commode, etc,.)?: Unable Help needed moving to and from a bed to chair (including a wheelchair)?: A Lot Help needed walking in hospital room?: Total Help needed climbing 3-5 steps with a railing? : Total 6 Click Score: 7    End of Session Equipment Utilized During Treatment: Gait belt Activity Tolerance: Patient limited by fatigue Patient left: with call bell/phone within reach;in chair;with chair alarm set Nurse Communication: Mobility status;Other (comment) (low BP and pre-syncopal at EOB) PT Visit Diagnosis: Difficulty in walking, not elsewhere classified (R26.2);Pain Pain - Right/Left: Right Pain - part of body: Hip    Time: 3151-7616 PT Time Calculation (min) (ACUTE ONLY): 38 min   Charges:   PT Evaluation $PT Eval Moderate Complexity: 1 Mod PT Treatments $Therapeutic Exercise: 8-22 mins $Therapeutic Activity: 8-22 mins   PT G CodesMagda Kiel,  [place] [phone] 08/23/2016   Reginia Naas 08/23/2016, 11:23 AM

## 2016-08-24 NOTE — Progress Notes (Signed)
Physical Therapy Treatment Patient Details Name: [name] Long MRN: [id number] DOB: July 29, 1940 Today's Date: 08/24/2016    History of Present Illness Patient is a 76 y/o female admitted for R direct anterior THA.  She has PMH positive for tinnitis and vertigo, CABG, cervical fusion, bladder repair, RA, GERD and fibromyalgia.     PT Comments    Pt performed increased gait and progressed to step through pattern this pm.  Plan for continued progression in am in addition to stairs and standing exercises.  Pt to d/c home tomorrow.     Follow Up Recommendations  DC plan and follow up therapy as arranged by surgeon     Equipment Recommendations  None recommended by PT    Recommendations for Other Services       Precautions / Restrictions Precautions Precautions: Fall Precaution Comments: watch BP (mildly orthostatic) Restrictions Weight Bearing Restrictions: Yes (R WBAT)    Mobility  Bed Mobility               General bed mobility comments: Pt in recliner chair on arrival.    Transfers Overall transfer level: Needs assistance Equipment used: Rolling walker (2 wheeled) Transfers: Sit to/from Stand Sit to Stand: Min assist            Ambulation/Gait Ambulation/Gait assistance: Min assist Ambulation Distance (Feet): 110 Feet Assistive device: Rolling walker (2 wheeled) Gait Pattern/deviations: Step-to pattern;Decreased stride length;Trunk flexed;Shuffle;Step-through pattern;Antalgic   Gait velocity interpretation: Below normal speed for age/gender General Gait Details: Pt required cues for R heel strike and knee extension in stance phase.  Pt able to progress to step through pattern this pm with one instance of buckling during gait training.     Stairs            Wheelchair Mobility    Modified Rankin (Stroke Patients Only)       Balance Overall balance assessment: Needs assistance Sitting-balance support: Single extremity supported Sitting  balance-Leahy Scale: Fair       Standing balance-Leahy Scale: Poor Standing balance comment: UE support and min A for balance                            Cognition Arousal/Alertness: Awake/alert Behavior During Therapy: WFL for tasks assessed/performed Overall Cognitive Status: Within Functional Limits for tasks assessed                                        Exercises Total Joint Exercises Ankle Circles/Pumps: AROM;Both;10 reps;Supine Quad Sets: AROM;10 reps;Supine;Right Short Arc Quad: Right;10 reps;Supine;AAROM;AROM Heel Slides: Right;10 reps;Supine;AAROM Hip ABduction/ADduction: Right;10 reps;Supine;AAROM    General Comments        Pertinent Vitals/Pain Pain Assessment: 0-10 Pain Score: 6  Pain Location: R hip with mobility Pain Descriptors / Indicators: Operative site guarding;Discomfort;Tender Pain Intervention(s): Monitored during session;Repositioned;Ice applied    Home Living                      Prior Function            PT Goals (current goals can now be found in the care plan section) Acute Rehab PT Goals Patient Stated Goal: To return home  Potential to Achieve Goals: Good Progress towards PT goals: Progressing toward goals    Frequency    7X/week      PT Plan Current plan  remains appropriate    Co-evaluation              AM-PAC PT "6 Clicks" Daily Activity  Outcome Measure  Difficulty turning over in bed (including adjusting bedclothes, sheets and blankets)?: Unable Difficulty moving from lying on back to sitting on the side of the bed? : Unable Difficulty sitting down on and standing up from a chair with arms (e.g., wheelchair, bedside commode, etc,.)?: Unable Help needed moving to and from a bed to chair (including a wheelchair)?: A Little Help needed walking in hospital room?: A Little Help needed climbing 3-5 steps with a railing? : A Lot 6 Click Score: 11    End of Session Equipment  Utilized During Treatment: Gait belt Activity Tolerance: Patient limited by fatigue Patient left: in bed;with call bell/phone within reach;with family/visitor present Nurse Communication: Mobility status;Other (comment) PT Visit Diagnosis: Difficulty in walking, not elsewhere classified (R26.2);Pain Pain - Right/Left: Right Pain - part of body: Hip     Time: 2130-8657 PT Time Calculation (min) (ACUTE ONLY): 20 min  Charges:  $Gait Training: 8-22 mins                    G Codes:       Governor Rooks, PTA pager [phone]    Cristela Blue 08/24/2016, 4:57 PM

## 2016-08-24 NOTE — Progress Notes (Signed)
Subjective: 2 Days Post-Op Procedure(s) (LRB): TOTAL HIP ARTHROPLASTY ANTERIOR APPROACH (Right)  Activity level:  OOB with PT Diet tolerance:  Regular Voiding:  well Patient reports pain as moderate.    Objective: Vital signs in last 24 hours: Temp:  [98.4 F (36.9 C)-102.6 F (39.2 C)] 98.4 F (36.9 C) (08/23 0738) Pulse Rate:  [73-101] 101 (08/23 0738) Resp:  [16-18] 16 (08/23 0738) BP: (96-101)/(50-69) 101/69 (08/23 0738) SpO2:  [93 %-96 %] 95 % (08/23 0738)  Labs: No results for input(s): HGB in the last 72 hours. No results for input(s): WBC, RBC, HCT, PLT in the last 72 hours. No results for input(s): NA, K, CL, CO2, BUN, CREATININE, GLUCOSE, CALCIUM in the last 72 hours. No results for input(s): LABPT, INR in the last 72 hours.  Physical Exam:  Neurologically intact ABD soft Sensation intact distally Intact pulses distally Dorsiflexion/Plantar flexion intact Compartment soft  Assessment/Plan:  2 Days Post-Op Procedure(s) (LRB): TOTAL HIP ARTHROPLASTY ANTERIOR APPROACH (Right) Up with therapy Plan for discharge tomorrow home with home health/PT    Grover Woodfield G 08/24/2016, 11:27 AM

## 2016-08-24 NOTE — Progress Notes (Signed)
Physical Therapy Treatment Patient Details Name: [name] Long MRN: [id number] DOB: 1940/10/03 Today's Date: 08/24/2016    History of Present Illness Patient is a 76 y/o female admitted for R direct anterior THA.  She has PMH positive for tinnitis and vertigo, CABG, cervical fusion, bladder repair, RA, GERD and fibromyalgia.     PT Comments    Pt performed increased gait with max VCs to correct sequencing.  Pt increased gait and reviewed HEP.  Pt reports feeling better post tx this am.  Pt resting comfortably in chair.     Follow Up Recommendations  DC plan and follow up therapy as arranged by surgeon     Equipment Recommendations  None recommended by PT    Recommendations for Other Services       Precautions / Restrictions Precautions Precautions: Fall Precaution Comments: watch BP (mildly orthostatic) Restrictions Weight Bearing Restrictions: Yes (R WBAT)    Mobility  Bed Mobility               General bed mobility comments: Pt in recliner chair on arrival.    Transfers Overall transfer level: Needs assistance Equipment used: Rolling walker (2 wheeled) Transfers: Sit to/from Stand Sit to Stand: Min assist         General transfer comment: Cues for hand placement to and from seated surface, cues for eccentric loading.  Min assist to boost into standing.    Ambulation/Gait Ambulation/Gait assistance: Min assist Ambulation Distance (Feet): 70 Feet Assistive device: Rolling walker (2 wheeled) Gait Pattern/deviations: Step-to pattern;Decreased stride length;Trunk flexed;Shuffle;Step-through pattern   Gait velocity interpretation: Below normal speed for age/gender General Gait Details: Pt attempted step through but appears to sequence dragging to the R foot.  Cues for step to pattern and improved symmetry and safety observed.  Pt required frequent cueing to avoid improper sequencing.     Stairs            Wheelchair Mobility    Modified Rankin (Stroke  Patients Only)       Balance Overall balance assessment: Needs assistance Sitting-balance support: Single extremity supported Sitting balance-Leahy Scale: Fair Sitting balance - Comments: improved upright posture   Standing balance support: Bilateral upper extremity supported Standing balance-Leahy Scale: Poor Standing balance comment: UE support and min A for balance                            Cognition Arousal/Alertness: Awake/alert Behavior During Therapy: WFL for tasks assessed/performed Overall Cognitive Status: Within Functional Limits for tasks assessed                                        Exercises Total Joint Exercises Ankle Circles/Pumps: AROM;Both;10 reps;Supine Quad Sets: AROM;10 reps;Supine;Right Short Arc Quad: Right;10 reps;Supine;AAROM;AROM Heel Slides: Right;10 reps;Supine;AAROM Hip ABduction/ADduction: Right;10 reps;Supine;AAROM Long Arc Quad: AROM;Right;10 reps;Supine    General Comments        Pertinent Vitals/Pain Pain Assessment: 0-10 Pain Score: 8  Pain Location: R hip with mobility Pain Descriptors / Indicators: Operative site guarding;Discomfort;Tender Pain Intervention(s): Monitored during session;Repositioned;Ice applied    Home Living                      Prior Function            PT Goals (current goals can now be found in the care plan section)  Acute Rehab PT Goals Patient Stated Goal: To return home  Potential to Achieve Goals: Good Progress towards PT goals: Progressing toward goals    Frequency    7X/week      PT Plan Current plan remains appropriate    Co-evaluation              AM-PAC PT "6 Clicks" Daily Activity  Outcome Measure  Difficulty turning over in bed (including adjusting bedclothes, sheets and blankets)?: Unable Difficulty moving from lying on back to sitting on the side of the bed? : Unable Difficulty sitting down on and standing up from a chair with arms  (e.g., wheelchair, bedside commode, etc,.)?: Unable Help needed moving to and from a bed to chair (including a wheelchair)?: A Little Help needed walking in hospital room?: A Little Help needed climbing 3-5 steps with a railing? : A Lot 6 Click Score: 11    End of Session Equipment Utilized During Treatment: Gait belt Activity Tolerance: Patient limited by fatigue Patient left: in bed;with call bell/phone within reach;with family/visitor present Nurse Communication: Mobility status;Other (comment) PT Visit Diagnosis: Difficulty in walking, not elsewhere classified (R26.2);Pain Pain - Right/Left: Right Pain - part of body: Hip     Time: 1043-1106 PT Time Calculation (min) (ACUTE ONLY): 23 min  Charges:  $Gait Training: 8-22 mins $Therapeutic Exercise: 8-22 mins                    G Codes:       Governor Rooks, PTA pager [phone]    Cristela Blue 08/24/2016, 11:19 AM

## 2016-08-25 MED ORDER — DOCUSATE SODIUM 100 MG PO CAPS: 100.0000 mg | ORAL_CAPSULE | Freq: Two times a day (BID) | ORAL | 0 refills | Status: DC

## 2016-08-25 MED ORDER — ASPIRIN 325 MG PO TBEC: 325.0000 mg | DELAYED_RELEASE_TABLET | Freq: Two times a day (BID) | ORAL | 0 refills | Status: DC

## 2016-08-25 MED ORDER — BISACODYL 5 MG PO TBEC: 5.0000 mg | DELAYED_RELEASE_TABLET | Freq: Every day | ORAL | 0 refills | Status: DC | PRN

## 2016-08-25 NOTE — Discharge Summary (Signed)
Patient ID: [name] Long MRN: [id number] DOB/AGE: 75-Jul-1942 75 y.o.  Admit date: 08/22/2016 Discharge date: 08/25/2016  Admission Diagnoses:  Principal Problem:   Primary localized osteoarthritis of right hip Active Problems:   Primary osteoarthritis of right hip   Discharge Diagnoses:  Same  Past Medical History:  Diagnosis Date  . Abnormal EKG 01/12/2011  . Allergy    RHINITIS  . Arthritis   . Asthma   . Asthma 01/12/2011  . Back pain, chronic--plans were for cervical disc surgery week of 01/16/11 01/12/2011  . Blood clot in vein 2004   L leg  . Blood transfusion without reported diagnosis 2004  . Chronic low back pain   . Complication of anesthesia   . Connective tissue disease ([place])   . Coronary artery disease   . Crescendo angina, with EKG changes, new. 01/12/2011  . Dyspnea   . Fibromyalgia   . Fibromyalgia   . Headache    migraine - several in a yr.   . History of stress test 04/2011   Normal stress test  . Hx of echocardiogram 07/19/2011   EF 50-55%. There is mild annular calcification, there is trace migtral regurgitation, there is trace tricupid regurgitation, pericardium is thick, there is no pericardial effusion.  . Hypertension   . Myocardial infarction [facility]) 2013   triple heart bypass  . Neuromuscular disorder (HCC)    fibromyalgia   . Osteoporosis    OSTEOPENIA  . Personal history of colonic polyp - adenoma 08/05/2013   08/2013 - 7 mm adenoma - consider repeat colonoscopy 2020 (age 77)  . Pneumonia    numerous times, only admitted one time for pneumonia but she has had pneumonia numerous times  . PONV (postoperative nausea and vomiting)   . PPD positive   . Status post coronary artery bypass grafting   . Urinary incontinence    post op, "sleepy bladder", urinar retention     Surgeries: Procedure(s): TOTAL HIP ARTHROPLASTY ANTERIOR APPROACH on 08/22/2016   Consultants:   Discharged Condition: Improved  Hospital Course: [name] Long is an 76 y.o.  female who was admitted 08/22/2016 for operative treatment ofPrimary localized osteoarthritis of right hip. Patient has severe unremitting pain that affects sleep, daily activities, and work/hobbies. After pre-op clearance the patient was taken to the operating room on 08/22/2016 and underwent  Procedure(s): TOTAL HIP ARTHROPLASTY ANTERIOR APPROACH.    Patient was given perioperative antibiotics: Anti-infectives    Start     Dose/Rate Route Frequency Ordered Stop   08/22/16 1830  hydroxychloroquine (PLAQUENIL) tablet 200 mg     200 mg Oral Every evening 08/22/16 1820     08/22/16 1330  ceFAZolin (ANCEF) IVPB 2g/100 mL premix     2 g 200 mL/hr over 30 Minutes Intravenous Every 6 hours 08/22/16 0920 08/23/16 0129   08/22/16 0715  ceFAZolin (ANCEF) IVPB 2g/100 mL premix     2 g 200 mL/hr over 30 Minutes Intravenous On call to O.R. 08/21/16 1348 08/22/16 0713       Patient was given sequential compression devices, early ambulation, and chemoprophylaxis to prevent DVT.  Patient benefited maximally from hospital stay and there were no complications.    Recent vital signs: Patient Vitals for the past 24 hrs:  BP Temp Temp src Pulse Resp SpO2  08/25/16 0542 116/63 99.1 F (37.3 C) Oral (!) 105 16 93 %  08/24/16 2210 111/60 100.3 F (37.9 C) Oral (!) 118 16 95 %  08/24/16 1435 124/68 98.1 F (  36.7 C) Oral (!) 103 18 96 %  08/24/16 0738 101/69 98.4 F (36.9 C) Oral (!) 101 16 95 %     Recent laboratory studies: No results for input(s): WBC, HGB, HCT, PLT, NA, K, CL, CO2, BUN, CREATININE, GLUCOSE, INR, CALCIUM in the last 72 hours.  Invalid input(s): PT, 2   Discharge Medications:   Allergies as of 08/25/2016      Reactions   Morphine And Related Nausea And Vomiting   Percocet [oxycodone-acetaminophen] Other (See Comments)   Zolpidem Other (See Comments)   Unusual behavior      Medication List    STOP taking these medications   aspirin 81 MG tablet Replaced by:  aspirin 325 MG EC  tablet     TAKE these medications   albuterol 108 (90 Base) MCG/ACT inhaler Commonly known as:  PROVENTIL HFA INHALE 2 PUFF AS DIRECTED FOUR TIMES A DAY AS NEEDED What changed:  how much to take  how to take this  when to take this  reasons to take this  additional instructions   aspirin 325 MG EC tablet Take 1 tablet (325 mg total) by mouth 2 (two) times daily after a meal. Replaces:  aspirin 81 MG tablet   atorvastatin 20 MG tablet Commonly known as:  LIPITOR TAKE ONE TABLET BY MOUTH DAILY  --  **MUST CALL MD FOR APPOINTMENT What changed:  See the new instructions.   bisacodyl 5 MG EC tablet Commonly known as:  DULCOLAX Take 1 tablet (5 mg total) by mouth daily as needed for moderate constipation.   butalbital-acetaminophen-caffeine 50-325-40 MG tablet Commonly known as:  FIORICET, ESGIC Take 1 tablet by mouth every 6 (six) hours as needed for headache.   CALCIUM 1200 PO Take 1 tablet by mouth daily.   carisoprodol 350 MG tablet Commonly known as:  SOMA Take 350 mg by mouth 2 (two) times daily as needed for muscle spasms.   cholecalciferol 1000 units tablet Commonly known as:  VITAMIN D Take 1,000 Units by mouth daily.   docusate sodium 100 MG capsule Commonly known as:  COLACE Take 1 capsule (100 mg total) by mouth 2 (two) times daily.   Fluticasone-Salmeterol 250-50 MCG/DOSE Aepb Commonly known as:  ADVAIR Inhale 1 puff into the lungs 2 (two) times daily.   Fluticasone-Salmeterol 500-50 MCG/DOSE Aepb Commonly known as:  ADVAIR DISKUS Inhale 1 puff into the lungs 2 (two) times daily.   gabapentin 600 MG tablet Commonly known as:  NEURONTIN Take 1,800 mg by mouth 2 (two) times daily. Takes 3 tablets at noon and 3 tablets at bedtime   HYDROmorphone 4 MG tablet Commonly known as:  DILAUDID Take 4 mg by mouth 3 (three) times daily as needed for severe pain. For pain   hydroxychloroquine 200 MG tablet Commonly known as:  PLAQUENIL Take 200 mg by  mouth every evening.   magnesium oxide 400 MG tablet Commonly known as:  MAG-OX Take 400 mg by mouth at bedtime.   metoprolol tartrate 25 MG tablet Commonly known as:  LOPRESSOR TAKE 1 TABLET (25 MG TOTAL) BY MOUTH 2 (TWO) TIMES DAILY.   montelukast 10 MG tablet Commonly known as:  SINGULAIR TAKE 1 TABLET BY MOUTH  ONCE A DAY What changed:  how much to take  how to take this  when to take this  additional instructions   predniSONE 10 MG tablet Commonly known as:  DELTASONE Take 10 mg by mouth daily as needed (for connective tissue).   predniSONE 50  MG tablet Commonly known as:  DELTASONE Take 1 tablet daily.   silodosin 4 MG Caps capsule Commonly known as:  RAPAFLO Take 1 capsule (4 mg total) by mouth daily with breakfast.   SYSTANE OP Apply 1 drop to eye as needed (DRY EYES).   Vitamin D (Ergocalciferol) 50000 units Caps capsule Commonly known as:  DRISDOL TAKE 1 CAPSULE BY MOUTH EVERY 7 DAYS            Durable Medical Equipment        Start     Ordered   08/22/16 1820  DME Walker rolling  Once    Question:  Patient needs a walker to treat with the following condition  Answer:  Primary osteoarthritis of right hip   08/22/16 1820   08/22/16 1820  DME 3 n 1  Once     08/22/16 1820   08/22/16 1820  DME Bedside commode  Once    Question:  Patient needs a bedside commode to treat with the following condition  Answer:  Primary osteoarthritis of right hip   08/22/16 1820       Discharge Care Instructions        Start     Ordered   08/25/16 0000  aspirin EC 325 MG EC tablet  2 times daily after meals    Question:  Supervising Provider  Answer:  Melrose Nakayama   08/25/16 0645   08/25/16 0000  bisacodyl (DULCOLAX) 5 MG EC tablet  Daily PRN    Question:  Supervising Provider  Answer:  Rhona Raider, PETER   08/25/16 0645   08/25/16 0000  docusate sodium (COLACE) 100 MG capsule  2 times daily    Question:  Supervising Provider  Answer:  Melrose Nakayama    08/25/16 0645   08/25/16 0000  Call MD / Call 911    Comments:  If you experience chest pain or shortness of breath, CALL 911 and be transported to the hospital emergency room.  If you develope a fever above 101 F, pus (white drainage) or increased drainage or redness at the wound, or calf pain, call your surgeon's office.   08/25/16 0645   08/25/16 0000  Diet - low sodium heart healthy     08/25/16 0645   08/25/16 0000  Constipation Prevention    Comments:  Drink plenty of fluids.  Prune juice may be helpful.  You may use a stool softener, such as Colace (over the counter) 100 mg twice a day.  Use MiraLax (over the counter) for constipation as needed.   08/25/16 0645   08/25/16 0000  Increase activity slowly as tolerated     08/25/16 0645   08/25/16 0000  Discharge instructions    Comments:  INSTRUCTIONS AFTER JOINT REPLACEMENT   Remove items at home which could result in a fall. This includes throw rugs or furniture in walking pathways ICE to the affected joint every three hours while awake for 30 minutes at a time, for at least the first 3-5 days, and then as needed for pain and swelling.  Continue to use ice for pain and swelling. You may notice swelling that will progress down to the foot and ankle.  This is normal after surgery.  Elevate your leg when you are not up walking on it.   Continue to use the breathing machine you got in the hospital (incentive spirometer) which will help keep your temperature down.  It is common for your temperature to cycle up and down following  surgery, especially at night when you are not up moving around and exerting yourself.  The breathing machine keeps your lungs expanded and your temperature down.   DIET:  As you were doing prior to hospitalization, we recommend a well-balanced diet.  DRESSING / WOUND CARE / SHOWERING  You may shower 3 days after surgery, but keep the wounds dry during showering.  You may use an occlusive plastic wrap (Press'n Seal for  example), NO SOAKING/SUBMERGING IN THE BATHTUB.  If the bandage gets wet, change with a clean dry gauze.  If the incision gets wet, pat the wound dry with a clean towel.  ACTIVITY  Increase activity slowly as tolerated, but follow the weight bearing instructions below.   No driving for 6 weeks or until further direction given by your physician.  You cannot drive while taking narcotics.  No lifting or carrying greater than 10 lbs. until further directed by your surgeon. Avoid periods of inactivity such as sitting longer than an hour when not asleep. This helps prevent blood clots.  You may return to work once you are authorized by your doctor.     WEIGHT BEARING   Weight bearing as tolerated with assist device (walker, cane, etc) as directed, use it as long as suggested by your surgeon or therapist, typically at least 4-6 weeks.   EXERCISES  Results after joint replacement surgery are often greatly improved when you follow the exercise, range of motion and muscle strengthening exercises prescribed by your doctor. Safety measures are also important to protect the joint from further injury. Any time any of these exercises cause you to have increased pain or swelling, decrease what you are doing until you are comfortable again and then slowly increase them. If you have problems or questions, call your caregiver or physical therapist for advice.   Rehabilitation is important following a joint replacement. After just a few days of immobilization, the muscles of the leg can become weakened and shrink (atrophy).  These exercises are designed to build up the tone and strength of the thigh and leg muscles and to improve motion. Often times heat used for twenty to thirty minutes before working out will loosen up your tissues and help with improving the range of motion but do not use heat for the first two weeks following surgery (sometimes heat can increase post-operative swelling).   These exercises can  be done on a training (exercise) mat, on the floor, on a table or on a bed. Use whatever works the best and is most comfortable for you.    Use music or television while you are exercising so that the exercises are a pleasant break in your day. This will make your life better with the exercises acting as a break in your routine that you can look forward to.   Perform all exercises about fifteen times, three times per day or as directed.  You should exercise both the operative leg and the other leg as well.   Exercises include:   Quad Sets - Tighten up the muscle on the front of the thigh (Quad) and hold for 5-10 seconds.   Straight Leg Raises - With your knee straight (if you were given a brace, keep it on), lift the leg to 60 degrees, hold for 3 seconds, and slowly lower the leg.  Perform this exercise against resistance later as your leg gets stronger.  Leg Slides: Lying on your back, slowly slide your foot toward your buttocks, bending your knee up  off the floor (only go as far as is comfortable). Then slowly slide your foot back down until your leg is flat on the floor again.  Angel Wings: Lying on your back spread your legs to the side as far apart as you can without causing discomfort.  Hamstring Strength:  Lying on your back, push your heel against the floor with your leg straight by tightening up the muscles of your buttocks.  Repeat, but this time bend your knee to a comfortable angle, and push your heel against the floor.  You may put a pillow under the heel to make it more comfortable if necessary.   A rehabilitation program following joint replacement surgery can speed recovery and prevent re-injury in the future due to weakened muscles. Contact your doctor or a physical therapist for more information on knee rehabilitation.    CONSTIPATION  Constipation is defined medically as fewer than three stools per week and severe constipation as less than one stool per week.  Even if you have a  regular bowel pattern at home, your normal regimen is likely to be disrupted due to multiple reasons following surgery.  Combination of anesthesia, postoperative narcotics, change in appetite and fluid intake all can affect your bowels.   YOU MUST use at least one of the following options; they are listed in order of increasing strength to get the job done.  They are all available over the counter, and you may need to use some, POSSIBLY even all of these options:    Drink plenty of fluids (prune juice may be helpful) and high fiber foods Colace 100 mg by mouth twice a day  Senokot for constipation as directed and as needed Dulcolax (bisacodyl), take with full glass of water  Miralax (polyethylene glycol) once or twice a day as needed.  If you have tried all these things and are unable to have a bowel movement in the first 3-4 days after surgery call either your surgeon or your primary doctor.    If you experience loose stools or diarrhea, hold the medications until you stool forms back up.  If your symptoms do not get better within 1 week or if they get worse, check with your doctor.  If you experience "the worst abdominal pain ever" or develop nausea or vomiting, please contact the office immediately for further recommendations for treatment.   ITCHING:  If you experience itching with your medications, try taking only a single pain pill, or even half a pain pill at a time.  You can also use Benadryl over the counter for itching or also to help with sleep.   TED HOSE STOCKINGS:  Use stockings on both legs until for at least 2 weeks or as directed by physician office. They may be removed at night for sleeping.  MEDICATIONS:  See your medication summary on the "After Visit Summary" that nursing will review with you.  You may have some home medications which will be placed on hold until you complete the course of blood thinner medication.  It is important for you to complete the blood thinner  medication as prescribed.  PRECAUTIONS:  If you experience chest pain or shortness of breath - call 911 immediately for transfer to the hospital emergency department.   If you develop a fever greater that 101 F, purulent drainage from wound, increased redness or drainage from wound, foul odor from the wound/dressing, or calf pain - CONTACT YOUR SURGEON.  FOLLOW-UP APPOINTMENTS:  If you do not already have a post-op appointment, please call the office for an appointment to be seen by your surgeon.  Guidelines for how soon to be seen are listed in your "After Visit Summary", but are typically between 1-4 weeks after surgery.  OTHER INSTRUCTIONS:   Knee Replacement:  Do not place pillow under knee, focus on keeping the knee straight while resting. CPM instructions: 0-90 degrees, 2 hours in the morning, 2 hours in the afternoon, and 2 hours in the evening. Place foam block, curve side up under heel at all times except when in CPM or when walking.  DO NOT modify, tear, cut, or change the foam block in any way.  MAKE SURE YOU:  Understand these instructions.  Get help right away if you are not doing well or get worse.    Thank you for letting us be a part of your medical care team.  It is a privilege we respect greatly.  We hope these instructions will help you stay on track for a fast and full recovery!   08/25/16 0645      Diagnostic Studies: Dg C-arm 1-60 Min  Result Date: 08/22/2016 CLINICAL DATA:  Right hip surgery. EXAM: DG C-ARM 61-120 MIN; OPERATIVE RIGHT HIP WITH PELVIS COMPARISON:  None FLUOROSCOPY TIME:  23 seconds FINDINGS: Right total hip arthroplasty. Postsurgical changes in the surrounding soft tissues. Normal alignment. No acute fracture dislocation. IMPRESSION: Interval right total hip arthroplasty. Electronically Signed   By: Kathreen Devoid   On: 08/22/2016 09:22   Dg Hip Operative Unilat With Pelvis Right  Result Date:  08/22/2016 CLINICAL DATA:  Right hip surgery. EXAM: DG C-ARM 61-120 MIN; OPERATIVE RIGHT HIP WITH PELVIS COMPARISON:  None FLUOROSCOPY TIME:  23 seconds FINDINGS: Right total hip arthroplasty. Postsurgical changes in the surrounding soft tissues. Normal alignment. No acute fracture dislocation. IMPRESSION: Interval right total hip arthroplasty. Electronically Signed   By: Kathreen Devoid   On: 08/22/2016 09:22    Disposition: 01-Home or Self Care  Discharge Instructions    Call MD / Call 911    Complete by:  As directed    If you experience chest pain or shortness of breath, CALL 911 and be transported to the hospital emergency room.  If you develope a fever above 101 F, pus (white drainage) or increased drainage or redness at the wound, or calf pain, call your surgeon's office.   Constipation Prevention    Complete by:  As directed    Drink plenty of fluids.  Prune juice may be helpful.  You may use a stool softener, such as Colace (over the counter) 100 mg twice a day.  Use MiraLax (over the counter) for constipation as needed.   Diet - low sodium heart healthy    Complete by:  As directed    Discharge instructions    Complete by:  As directed    INSTRUCTIONS AFTER JOINT REPLACEMENT   Remove items at home which could result in a fall. This includes throw rugs or furniture in walking pathways ICE to the affected joint every three hours while awake for 30 minutes at a time, for at least the first 3-5 days, and then as needed for pain and swelling.  Continue to use ice for pain and swelling. You may notice swelling that will progress down to the foot and ankle.  This is normal after surgery.  Elevate your leg when you are not up walking on it.   Continue  to use the breathing machine you got in the hospital (incentive spirometer) which will help keep your temperature down.  It is common for your temperature to cycle up and down following surgery, especially at night when you are not up moving around  and exerting yourself.  The breathing machine keeps your lungs expanded and your temperature down.   DIET:  As you were doing prior to hospitalization, we recommend a well-balanced diet.  DRESSING / WOUND CARE / SHOWERING  You may shower 3 days after surgery, but keep the wounds dry during showering.  You may use an occlusive plastic wrap (Press'n Seal for example), NO SOAKING/SUBMERGING IN THE BATHTUB.  If the bandage gets wet, change with a clean dry gauze.  If the incision gets wet, pat the wound dry with a clean towel.  ACTIVITY  Increase activity slowly as tolerated, but follow the weight bearing instructions below.   No driving for 6 weeks or until further direction given by your physician.  You cannot drive while taking narcotics.  No lifting or carrying greater than 10 lbs. until further directed by your surgeon. Avoid periods of inactivity such as sitting longer than an hour when not asleep. This helps prevent blood clots.  You may return to work once you are authorized by your doctor.     WEIGHT BEARING   Weight bearing as tolerated with assist device (walker, cane, etc) as directed, use it as long as suggested by your surgeon or therapist, typically at least 4-6 weeks.   EXERCISES  Results after joint replacement surgery are often greatly improved when you follow the exercise, range of motion and muscle strengthening exercises prescribed by your doctor. Safety measures are also important to protect the joint from further injury. Any time any of these exercises cause you to have increased pain or swelling, decrease what you are doing until you are comfortable again and then slowly increase them. If you have problems or questions, call your caregiver or physical therapist for advice.   Rehabilitation is important following a joint replacement. After just a few days of immobilization, the muscles of the leg can become weakened and shrink (atrophy).  These exercises are designed to  build up the tone and strength of the thigh and leg muscles and to improve motion. Often times heat used for twenty to thirty minutes before working out will loosen up your tissues and help with improving the range of motion but do not use heat for the first two weeks following surgery (sometimes heat can increase post-operative swelling).   These exercises can be done on a training (exercise) mat, on the floor, on a table or on a bed. Use whatever works the best and is most comfortable for you.    Use music or television while you are exercising so that the exercises are a pleasant break in your day. This will make your life better with the exercises acting as a break in your routine that you can look forward to.   Perform all exercises about fifteen times, three times per day or as directed.  You should exercise both the operative leg and the other leg as well.   Exercises include:   Quad Sets - Tighten up the muscle on the front of the thigh (Quad) and hold for 5-10 seconds.   Straight Leg Raises - With your knee straight (if you were given a brace, keep it on), lift the leg to 60 degrees, hold for 3 seconds, and slowly lower  the leg.  Perform this exercise against resistance later as your leg gets stronger.  Leg Slides: Lying on your back, slowly slide your foot toward your buttocks, bending your knee up off the floor (only go as far as is comfortable). Then slowly slide your foot back down until your leg is flat on the floor again.  Angel Wings: Lying on your back spread your legs to the side as far apart as you can without causing discomfort.  Hamstring Strength:  Lying on your back, push your heel against the floor with your leg straight by tightening up the muscles of your buttocks.  Repeat, but this time bend your knee to a comfortable angle, and push your heel against the floor.  You may put a pillow under the heel to make it more comfortable if necessary.   A rehabilitation program following  joint replacement surgery can speed recovery and prevent re-injury in the future due to weakened muscles. Contact your doctor or a physical therapist for more information on knee rehabilitation.    CONSTIPATION  Constipation is defined medically as fewer than three stools per week and severe constipation as less than one stool per week.  Even if you have a regular bowel pattern at home, your normal regimen is likely to be disrupted due to multiple reasons following surgery.  Combination of anesthesia, postoperative narcotics, change in appetite and fluid intake all can affect your bowels.   YOU MUST use at least one of the following options; they are listed in order of increasing strength to get the job done.  They are all available over the counter, and you may need to use some, POSSIBLY even all of these options:    Drink plenty of fluids (prune juice may be helpful) and high fiber foods Colace 100 mg by mouth twice a day  Senokot for constipation as directed and as needed Dulcolax (bisacodyl), take with full glass of water  Miralax (polyethylene glycol) once or twice a day as needed.  If you have tried all these things and are unable to have a bowel movement in the first 3-4 days after surgery call either your surgeon or your primary doctor.    If you experience loose stools or diarrhea, hold the medications until you stool forms back up.  If your symptoms do not get better within 1 week or if they get worse, check with your doctor.  If you experience "the worst abdominal pain ever" or develop nausea or vomiting, please contact the office immediately for further recommendations for treatment.   ITCHING:  If you experience itching with your medications, try taking only a single pain pill, or even half a pain pill at a time.  You can also use Benadryl over the counter for itching or also to help with sleep.   TED HOSE STOCKINGS:  Use stockings on both legs until for at least 2 weeks or as  directed by physician office. They may be removed at night for sleeping.  MEDICATIONS:  See your medication summary on the "After Visit Summary" that nursing will review with you.  You may have some home medications which will be placed on hold until you complete the course of blood thinner medication.  It is important for you to complete the blood thinner medication as prescribed.  PRECAUTIONS:  If you experience chest pain or shortness of breath - call 911 immediately for transfer to the hospital emergency department.   If you develop a fever greater that 101  F, purulent drainage from wound, increased redness or drainage from wound, foul odor from the wound/dressing, or calf pain - CONTACT YOUR SURGEON.                                                   FOLLOW-UP APPOINTMENTS:  If you do not already have a post-op appointment, please call the office for an appointment to be seen by your surgeon.  Guidelines for how soon to be seen are listed in your "After Visit Summary", but are typically between 1-4 weeks after surgery.  OTHER INSTRUCTIONS:   Knee Replacement:  Do not place pillow under knee, focus on keeping the knee straight while resting. CPM instructions: 0-90 degrees, 2 hours in the morning, 2 hours in the afternoon, and 2 hours in the evening. Place foam block, curve side up under heel at all times except when in CPM or when walking.  DO NOT modify, tear, cut, or change the foam block in any way.  MAKE SURE YOU:  Understand these instructions.  Get help right away if you are not doing well or get worse.    Thank you for letting us be a part of your medical care team.  It is a privilege we respect greatly.  We hope these instructions will help you stay on track for a fast and full recovery!   Increase activity slowly as tolerated    Complete by:  As directed       Follow-up Information    Melrose Nakayama, MD. Schedule an appointment as soon as possible for a visit in 2 weeks.    Specialty:  Orthopedic Surgery Contact information: [address] [place] [address] [phone]            Signed: Rich Fuchs 08/25/2016, 6:45 AM

## 2016-08-25 NOTE — Progress Notes (Signed)
Physical Therapy Treatment Patient Details Name: [name] Long MRN: [id number] DOB: 28-Apr-1940 Today's Date: 08/25/2016    History of Present Illness Patient is a 76 y/o female admitted for R direct anterior THA.  She has PMH positive for tinnitis and vertigo, CABG, cervical fusion, bladder repair, RA, GERD and fibromyalgia.     PT Comments    Pt performed increased mobility during session this am.  Pt performed stair training and progressed to standing exercises during session this am.  Plan for d/c home today.  Will f/u in pm if patient remains hospitalized.    Follow Up Recommendations  DC plan and follow up therapy as arranged by surgeon     Equipment Recommendations  None recommended by PT    Recommendations for Other Services       Precautions / Restrictions Precautions Precautions: Fall Restrictions Weight Bearing Restrictions: Yes (RLE WBAT)    Mobility  Bed Mobility               General bed mobility comments: Pt in recliner chair on arrival.    Transfers Overall transfer level: Needs assistance Equipment used: Rolling walker (2 wheeled) Transfers: Sit to/from Stand Sit to Stand: Supervision         General transfer comment: Supervision for safety, no assistance needed remains slow and guarded due to pain, remains to require cues for eccentric loading.    Ambulation/Gait Ambulation/Gait assistance: Supervision Ambulation Distance (Feet): 160 Feet Assistive device: Rolling walker (2 wheeled) Gait Pattern/deviations: Step-through pattern;Decreased stride length;Decreased dorsiflexion - right   Gait velocity interpretation: Below normal speed for age/gender General Gait Details: Pt remains to present with quad weakness and required cues for R foot clearance and R dorsiflexion.  Pt with better foot clearance but remains to compensate to clear R LE.     Stairs Stairs: Yes   Stair Management: Backwards;No rails;With walker Number of Stairs: 5 General  stair comments: Cues for sequencing and RW placement, Handout issued for stair negotiation as husband not present during session this am.  Pt able to verablize and demonstrate understanding.    Wheelchair Mobility    Modified Rankin (Stroke Patients Only)       Balance Overall balance assessment: Needs assistance   Sitting balance-Leahy Scale: Good       Standing balance-Leahy Scale: Fair                              Cognition Arousal/Alertness: Awake/alert Behavior During Therapy: WFL for tasks assessed/performed Overall Cognitive Status: Within Functional Limits for tasks assessed                                        Exercises Total Joint Exercises Hip ABduction/ADduction: 10 reps;AROM;Right;Standing Knee Flexion: AROM;Right;10 reps;Standing Marching in Standing: AROM;Right;10 reps;Standing Standing Hip Extension: AROM;Right;10 reps;Standing    General Comments        Pertinent Vitals/Pain Pain Assessment: 0-10 Pain Score: 4  Pain Location: R hip with mobility Pain Descriptors / Indicators: Operative site guarding;Discomfort;Tender Pain Intervention(s): Monitored during session;Repositioned    Home Living                      Prior Function            PT Goals (current goals can now be found in the care plan section) Acute  Rehab PT Goals Potential to Achieve Goals: Good Progress towards PT goals: Progressing toward goals    Frequency    7X/week      PT Plan Current plan remains appropriate    Co-evaluation              AM-PAC PT "6 Clicks" Daily Activity  Outcome Measure  Difficulty turning over in bed (including adjusting bedclothes, sheets and blankets)?: A Lot Difficulty moving from lying on back to sitting on the side of the bed? : A Lot Difficulty sitting down on and standing up from a chair with arms (e.g., wheelchair, bedside commode, etc,.)?: A Lot Help needed moving to and from a bed to  chair (including a wheelchair)?: A Little Help needed walking in hospital room?: A Little Help needed climbing 3-5 steps with a railing? : A Little 6 Click Score: 15    End of Session Equipment Utilized During Treatment: Gait belt Activity Tolerance: Patient limited by fatigue Patient left: in bed;with call bell/phone within reach;with family/visitor present Nurse Communication: Mobility status;Other (comment) PT Visit Diagnosis: Difficulty in walking, not elsewhere classified (R26.2);Pain Pain - Right/Left: Right Pain - part of body: Hip     Time: 3343-5686 PT Time Calculation (min) (ACUTE ONLY): 25 min  Charges:  $Gait Training: 8-22 mins $Therapeutic Exercise: 8-22 mins                    G Codes:       Governor Rooks, PTA pager [phone]    Cristela Blue 08/25/2016, 1:16 PM

## 2016-08-25 NOTE — Progress Notes (Signed)
Removed IV, provided discharge education/instructions, all questions and concerns addressed, patient not in distress, discharged home accompanied by husband.

## 2016-08-25 NOTE — Care Management Important Message (Signed)
Important Message  Patient Details  Name: [name] Long MRN: [id number] Date of Birth: 05/01/1940   Medicare Important Message Given:  Yes    Analucia Hush Abena 08/25/2016, 11:09 AM

## 2016-09-06 DIAGNOSIS — Z9889 Other specified postprocedural states: Secondary | ICD-10-CM | POA: Diagnosis not present

## 2016-09-06 DIAGNOSIS — M6281 Muscle weakness (generalized): Secondary | ICD-10-CM | POA: Diagnosis not present

## 2016-09-06 DIAGNOSIS — M25651 Stiffness of right hip, not elsewhere classified: Secondary | ICD-10-CM | POA: Diagnosis not present

## 2016-09-06 DIAGNOSIS — M1611 Unilateral primary osteoarthritis, right hip: Secondary | ICD-10-CM | POA: Diagnosis not present

## 2016-09-06 DIAGNOSIS — Z96641 Presence of right artificial hip joint: Secondary | ICD-10-CM | POA: Diagnosis not present

## 2016-09-07 DIAGNOSIS — M25651 Stiffness of right hip, not elsewhere classified: Secondary | ICD-10-CM | POA: Diagnosis not present

## 2016-09-07 DIAGNOSIS — Z96641 Presence of right artificial hip joint: Secondary | ICD-10-CM | POA: Diagnosis not present

## 2016-09-07 DIAGNOSIS — M6281 Muscle weakness (generalized): Secondary | ICD-10-CM | POA: Diagnosis not present

## 2016-09-12 DIAGNOSIS — Z96641 Presence of right artificial hip joint: Secondary | ICD-10-CM | POA: Diagnosis not present

## 2016-09-12 DIAGNOSIS — M6281 Muscle weakness (generalized): Secondary | ICD-10-CM | POA: Diagnosis not present

## 2016-09-12 DIAGNOSIS — M25651 Stiffness of right hip, not elsewhere classified: Secondary | ICD-10-CM | POA: Diagnosis not present

## 2016-09-14 DIAGNOSIS — M25651 Stiffness of right hip, not elsewhere classified: Secondary | ICD-10-CM | POA: Diagnosis not present

## 2016-09-14 DIAGNOSIS — Z96641 Presence of right artificial hip joint: Secondary | ICD-10-CM | POA: Diagnosis not present

## 2016-09-14 DIAGNOSIS — M6281 Muscle weakness (generalized): Secondary | ICD-10-CM | POA: Diagnosis not present

## 2016-09-20 DIAGNOSIS — M6281 Muscle weakness (generalized): Secondary | ICD-10-CM | POA: Diagnosis not present

## 2016-09-20 DIAGNOSIS — Z96641 Presence of right artificial hip joint: Secondary | ICD-10-CM | POA: Diagnosis not present

## 2016-09-20 DIAGNOSIS — M25651 Stiffness of right hip, not elsewhere classified: Secondary | ICD-10-CM | POA: Diagnosis not present

## 2016-09-22 DIAGNOSIS — M25651 Stiffness of right hip, not elsewhere classified: Secondary | ICD-10-CM | POA: Diagnosis not present

## 2016-09-22 DIAGNOSIS — Z96641 Presence of right artificial hip joint: Secondary | ICD-10-CM | POA: Diagnosis not present

## 2016-09-22 DIAGNOSIS — M6281 Muscle weakness (generalized): Secondary | ICD-10-CM | POA: Diagnosis not present

## 2016-09-25 DIAGNOSIS — M25651 Stiffness of right hip, not elsewhere classified: Secondary | ICD-10-CM | POA: Diagnosis not present

## 2016-09-25 DIAGNOSIS — Z96641 Presence of right artificial hip joint: Secondary | ICD-10-CM | POA: Diagnosis not present

## 2016-09-25 DIAGNOSIS — M6281 Muscle weakness (generalized): Secondary | ICD-10-CM | POA: Diagnosis not present

## 2016-09-27 ENCOUNTER — Other Ambulatory Visit: Payer: Self-pay | Admitting: Family Medicine

## 2016-09-27 NOTE — Telephone Encounter (Signed)
Left message for pt to call me back 

## 2016-09-27 NOTE — Telephone Encounter (Signed)
See if this is working

## 2016-09-27 NOTE — Telephone Encounter (Signed)
Is this okay to refill? 

## 2016-09-28 DIAGNOSIS — M25651 Stiffness of right hip, not elsewhere classified: Secondary | ICD-10-CM | POA: Diagnosis not present

## 2016-09-28 DIAGNOSIS — Z96641 Presence of right artificial hip joint: Secondary | ICD-10-CM | POA: Diagnosis not present

## 2016-09-28 DIAGNOSIS — M6281 Muscle weakness (generalized): Secondary | ICD-10-CM | POA: Diagnosis not present

## 2016-09-28 NOTE — Telephone Encounter (Signed)
Pt called back and states she does not need this anymore, please cancel.  She used it for her surgery.

## 2016-10-19 ENCOUNTER — Emergency Department (HOSPITAL_COMMUNITY)
Admission: EM | Admit: 2016-10-19 | Discharge: 2016-10-19 | Disposition: A | Discharge status: Home / Self Care | Payer: PPO | Attending: Emergency Medicine | Admitting: Emergency Medicine

## 2016-10-19 DIAGNOSIS — M545 Low back pain: Secondary | ICD-10-CM | POA: Insufficient documentation

## 2016-10-19 DIAGNOSIS — R1030 Lower abdominal pain, unspecified: Secondary | ICD-10-CM | POA: Insufficient documentation

## 2016-10-19 DIAGNOSIS — Z96641 Presence of right artificial hip joint: Secondary | ICD-10-CM | POA: Insufficient documentation

## 2016-10-19 DIAGNOSIS — R300 Dysuria: Secondary | ICD-10-CM | POA: Diagnosis not present

## 2016-10-19 DIAGNOSIS — I10 Essential (primary) hypertension: Secondary | ICD-10-CM | POA: Insufficient documentation

## 2016-10-19 DIAGNOSIS — R338 Other retention of urine: Secondary | ICD-10-CM

## 2016-10-19 DIAGNOSIS — I252 Old myocardial infarction: Secondary | ICD-10-CM | POA: Diagnosis not present

## 2016-10-19 DIAGNOSIS — R339 Retention of urine, unspecified: Secondary | ICD-10-CM | POA: Insufficient documentation

## 2016-10-19 DIAGNOSIS — Z87891 Personal history of nicotine dependence: Secondary | ICD-10-CM | POA: Diagnosis not present

## 2016-10-19 DIAGNOSIS — I2510 Atherosclerotic heart disease of native coronary artery without angina pectoris: Secondary | ICD-10-CM | POA: Insufficient documentation

## 2016-10-19 DIAGNOSIS — J45909 Unspecified asthma, uncomplicated: Secondary | ICD-10-CM | POA: Insufficient documentation

## 2016-10-19 DIAGNOSIS — Z7982 Long term (current) use of aspirin: Secondary | ICD-10-CM | POA: Diagnosis not present

## 2016-10-19 DIAGNOSIS — Z79899 Other long term (current) drug therapy: Secondary | ICD-10-CM | POA: Insufficient documentation

## 2016-10-19 DIAGNOSIS — Z951 Presence of aortocoronary bypass graft: Secondary | ICD-10-CM | POA: Insufficient documentation

## 2016-10-19 LAB — URINALYSIS, ROUTINE W REFLEX MICROSCOPIC
BACTERIA UA: NONE SEEN
Bilirubin Urine: NEGATIVE
Glucose, UA: NEGATIVE mg/dL
Hgb urine dipstick: NEGATIVE
KETONES UR: NEGATIVE mg/dL
Leukocytes, UA: NEGATIVE
Nitrite: POSITIVE — AB
Protein, ur: NEGATIVE mg/dL
Specific Gravity, Urine: 1.01 (ref 1.005–1.030)
Squamous Epithelial / LPF: NONE SEEN
pH: 6 (ref 5.0–8.0)

## 2016-10-19 LAB — CBC
HEMATOCRIT: 38.9 % (ref 36.0–46.0)
HEMOGLOBIN: 12.8 g/dL (ref 12.0–15.0)
MCH: 29.2 pg (ref 26.0–34.0)
MCHC: 32.9 g/dL (ref 30.0–36.0)
MCV: 88.6 fL (ref 78.0–100.0)
Platelets: 178 10*3/uL (ref 150–400)
RBC: 4.39 MIL/uL (ref 3.87–5.11)
RDW: 13.2 % (ref 11.5–15.5)
WBC: 9.4 10*3/uL (ref 4.0–10.5)

## 2016-10-19 LAB — COMPREHENSIVE METABOLIC PANEL
ALBUMIN: 4.2 g/dL (ref 3.5–5.0)
ALK PHOS: 114 U/L (ref 38–126)
ALT: 15 U/L (ref 14–54)
ANION GAP: 9 (ref 5–15)
AST: 24 U/L (ref 15–41)
BUN: 14 mg/dL (ref 6–20)
CHLORIDE: 104 mmol/L (ref 101–111)
CO2: 24 mmol/L (ref 22–32)
Calcium: 9 mg/dL (ref 8.9–10.3)
Creatinine, Ser: 0.86 mg/dL (ref 0.44–1.00)
GFR calc Af Amer: >60 mL/min (ref >60)
GFR calc non Af Amer: >60 mL/min (ref >60)
GLUCOSE: 130 mg/dL — AB (ref 65–99)
POTASSIUM: 4.1 mmol/L (ref 3.5–5.1)
SODIUM: 137 mmol/L (ref 135–145)
Total Bilirubin: 0.5 mg/dL (ref 0.3–1.2)
Total Protein: 7.3 g/dL (ref 6.5–8.1)

## 2016-10-19 NOTE — ED Notes (Signed)
EDPs made aware of patient

## 2016-10-19 NOTE — ED Notes (Signed)
Pt has not voided since 0600 this morning and has not been able to void. Pt in extreme pain. Dr Regenia Skeeter notifed and her gave order for foley placement. 761ml of urine on bladder scan. Dr Regenia Skeeter in room.

## 2016-10-19 NOTE — ED Triage Notes (Signed)
Pt reports hasn't had a normal void since 11pm last night. Pt reports oliguria since this morning and spasm bladder pain. Pt states took AZO and dilaudid as well as tried to self cath with no relief. Pt awake, alert, appropriate. VSS.

## 2016-10-19 NOTE — ED Provider Notes (Signed)
[facility] EMERGENCY DEPARTMENT Provider Note   CSN: [id number] Arrival date & time: 10/19/16  1301     History   Chief Complaint Chief Complaint  Patient presents with  . Urinary Retention    HPI EMMARAE COWDERY is a 76 y.o. female.  HPI  76 year old female presents with urinary retention. She states this is happened to her before but typically after surgery. Last night she went to bed at 11 PM and had a normal urine output. However this morning at 6 AM she had trouble urinating and only urinate a very small amount. It burned. Since then has had worsening lower abdominal pain. She has not been able to urinate despite multiple attempts. Now has some low back pain. No fevers or vomiting. She tried to cath herself because she has some leftover catheters but it did not work. She states that this is happened to her before as her bladder "goes to sleep" after surgeries. However her last surgery was 7 weeks ago for a hip arthroplasty. Took azo this morning to help in case she was having a UTI.  Past Medical History:  Diagnosis Date  . Abnormal EKG 01/12/2011  . Allergy    RHINITIS  . Arthritis   . Asthma   . Asthma 01/12/2011  . Back pain, chronic--plans were for cervical disc surgery week of 01/16/11 01/12/2011  . Blood clot in vein 2004   L leg  . Blood transfusion without reported diagnosis 2004  . Chronic low back pain   . Complication of anesthesia   . Connective tissue disease ([place])   . Coronary artery disease   . Crescendo angina, with EKG changes, new. 01/12/2011  . Dyspnea   . Fibromyalgia   . Fibromyalgia   . Headache    migraine - several in a yr.   . History of stress test 04/2011   Normal stress test  . Hx of echocardiogram 07/19/2011   EF 50-55%. There is mild annular calcification, there is trace migtral regurgitation, there is trace tricupid regurgitation, pericardium is thick, there is no pericardial effusion.  . Hypertension   . Myocardial  infarction [facility]) 2013   triple heart bypass  . Neuromuscular disorder (HCC)    fibromyalgia   . Osteoporosis    OSTEOPENIA  . Personal history of colonic polyp - adenoma 08/05/2013   08/2013 - 7 mm adenoma - consider repeat colonoscopy 2020 (age 85)  . Pneumonia    numerous times, only admitted one time for pneumonia but she has had pneumonia numerous times  . PONV (postoperative nausea and vomiting)   . PPD positive   . Status post coronary artery bypass grafting   . Urinary incontinence    post op, "sleepy bladder", urinar retention     Patient Active Problem List   Diagnosis Date Noted  . Primary localized osteoarthritis of right hip 08/22/2016  . Primary osteoarthritis of right hip 08/22/2016  . Arthritis of right hip 05/01/2016  . Lumbar radiculopathy, right 03/06/2016  . Personal history of colonic polyp - adenoma 08/05/2013  . GERD (gastroesophageal reflux disease) 03/11/2013  . Rheumatoid arthritis ([place]) 03/11/2013  . High frequency hearing loss 03/11/2013  . Tinnitus 03/11/2013  . Hyperlipidemia 08/21/2012  . S/P CABG x 3: (lima-lad,svg-om,svg-rca) 01/16/2011  . CAD (coronary artery disease): multivessel, good LVF 50% at cath 01/16/2011  . Back pain, chronic--plans were for cervical disc surgery week of 01/16/11 01/12/2011  . Asthma, chronic 01/12/2011  . Fibromyalgia 01/12/2011  Past Surgical History:  Procedure Laterality Date  . ABDOMINAL HYSTERECTOMY  1981  . APPENDECTOMY  1946  . BLADDER REPAIR  1985  . BREAST SURGERY  1992   REDUCTION, x3 surgeries overall on breast for augmentation   . CARDIAC CATHETERIZATION  2013  . CARPAL TUNNEL RELEASE Bilateral 1988, 1999  . [place]  . CORONARY ARTERY BYPASS GRAFT  01/14/2011   Procedure: CORONARY ARTERY BYPASS GRAFTING (CABG);  Surgeon: Tharon Aquas Adelene Idler, MD;  Location: [facility];  Service: Open Heart Surgery;  Laterality: N/A;  CABG x three, using right greater saphenous vein harvested  endoscopically  . EYE SURGERY Bilateral 1985   radiokerototomy  . LEFT HEART CATHETERIZATION WITH CORONARY ANGIOGRAM N/A 01/13/2011   Procedure: LEFT HEART CATHETERIZATION WITH CORONARY ANGIOGRAM;  Surgeon: Troy Sine, MD;  Location: [facility] CATH LAB;  Service: Cardiovascular;  Laterality: N/A;  . PUBOVAGINAL SLING  2002  . STERNAL WOUND DEBRIDEMENT  07/27/2011   Procedure: STERNAL WOUND DEBRIDEMENT;  followed by wound vac Surgeon: Ivin Poot, MD;  Location: [facility];  Service: Open Heart Surgery;  Laterality: N/A;  . TONSILLECTOMY  1952  . TOTAL HIP ARTHROPLASTY Right 08/22/2016   Procedure: TOTAL HIP ARTHROPLASTY ANTERIOR APPROACH;  Surgeon: Melrose Nakayama, MD;  Location: [facility];  Service: Orthopedics;  Laterality: Right;  . TRIGGER FINGER RELEASE Right 2014   4th finger  . TROCHANTERIC BURSA EXCISION Right    bursa removed, R hip  . TUBAL LIGATION      OB History    No data available       Home Medications    Prior to Admission medications   Medication Sig Start Date End Date Taking? Authorizing Provider  albuterol (PROVENTIL HFA) 108 (90 Base) MCG/ACT inhaler INHALE 2 PUFF AS DIRECTED FOUR TIMES A DAY AS NEEDED Patient taking differently: Inhale 2 puffs into the lungs every 6 (six) hours as needed for wheezing or shortness of breath. INHALE 2 PUFF AS DIRECTED FOUR TIMES A DAY AS NEEDED 07/09/15  Yes Denita Lung, MD  aspirin EC 81 MG tablet Take 81 mg by mouth daily.   Yes [provider]  atorvastatin (LIPITOR) 20 MG tablet TAKE ONE TABLET BY MOUTH DAILY  --  **MUST CALL MD FOR APPOINTMENT Patient taking differently: TAKE 20 MG BY MOUTH DAILY  --  **MUST CALL MD FOR APPOINTMENT 08/14/16  Yes Denita Lung, MD  butalbital-acetaminophen-caffeine (FIORICET, ESGIC) 50-325-40 MG tablet Take 1 tablet by mouth every 6 (six) hours as needed for headache.    Yes [provider]  Calcium Carbonate-Vit D-Min (CALCIUM 1200 PO) Take 1 tablet by mouth daily.   Yes [provider]  carisoprodol (SOMA) 350 MG tablet Take 350 mg by mouth 2 (two) times daily as needed for muscle spasms.    Yes [provider]  cholecalciferol (VITAMIN D) 1000 units tablet Take 1,000 Units by mouth daily.   Yes [provider]  Fluticasone-Salmeterol (ADVAIR) 250-50 MCG/DOSE AEPB Inhale 1 puff into the lungs 2 (two) times daily.   Yes [provider]  gabapentin (NEURONTIN) 600 MG tablet Take 1,800 mg by mouth 2 (two) times daily. Takes 3 tablets at noon and 3 tablets at bedtime   Yes [provider]  HYDROmorphone (DILAUDID) 4 MG tablet Take 4 mg by mouth 3 (three) times daily as needed for severe pain. For pain    Yes [provider]  hydroxychloroquine (PLAQUENIL) 200 MG  tablet Take 200 mg by mouth every evening.    Yes [provider]  magnesium oxide (MAG-OX) 400 MG tablet Take 400 mg by mouth at bedtime.   Yes [provider]  metoprolol tartrate (LOPRESSOR) 25 MG tablet TAKE 1 TABLET (25 MG TOTAL) BY MOUTH 2 (TWO) TIMES DAILY. 04/05/16  Yes Troy Sine, MD  montelukast (SINGULAIR) 10 MG tablet TAKE 1 TABLET BY MOUTH  ONCE A DAY Patient taking differently: Take 10 mg by mouth at bedtime.  07/31/16  Yes Denita Lung, MD  Polyethyl Glycol-Propyl Glycol (SYSTANE OP) Apply 1 drop to eye as needed (DRY EYES).    Yes [provider]  silodosin (RAPAFLO) 4 MG CAPS capsule Take 1 capsule (4 mg total) by mouth daily with breakfast. 07/31/16  Yes Denita Lung, MD  Vitamin D, Ergocalciferol, (DRISDOL) 50000 units CAPS capsule TAKE 1 CAPSULE BY MOUTH EVERY 7 DAYS 08/21/16  Yes Hulan Saas M, DO  aspirin EC 325 MG EC tablet Take 1 tablet (325 mg total) by mouth 2 (two) times daily after a meal. Patient not taking: Reported on 10/19/2016 08/25/16   Loni Dolly, PA-C  bisacodyl (DULCOLAX) 5 MG EC tablet Take 1 tablet (5 mg total) by mouth daily as needed for moderate constipation. Patient not taking: Reported on  10/19/2016 08/25/16   Loni Dolly, PA-C  docusate sodium (COLACE) 100 MG capsule Take 1 capsule (100 mg total) by mouth 2 (two) times daily. Patient not taking: Reported on 10/19/2016 08/25/16   Loni Dolly, PA-C  Fluticasone-Salmeterol (ADVAIR DISKUS) 500-50 MCG/DOSE AEPB Inhale 1 puff into the lungs 2 (two) times daily. Patient not taking: Reported on 10/19/2016 07/31/16   Denita Lung, MD  predniSONE (DELTASONE) 50 MG tablet Take 1 tablet daily. Patient not taking: Reported on 08/15/2016 03/06/16   Lyndal Pulley, DO    Family History Family History  Problem Relation Age of Onset  . Adopted: Yes  . Hypertension Brother     Social History Social History  Substance Use Topics  . Smoking status: Former Smoker    Quit date: 01/12/1980  . Smokeless tobacco: Never Used  . Alcohol use No     Allergies   Morphine and related; Percocet [oxycodone-acetaminophen]; and Zolpidem   Review of Systems Review of Systems  Constitutional: Negative for fever.  Gastrointestinal: Positive for abdominal pain. Negative for vomiting.  Genitourinary: Positive for difficulty urinating and dysuria.  Musculoskeletal: Positive for back pain.  All other systems reviewed and are negative.    Physical Exam Updated Vital Signs BP 122/63   Pulse 72   Temp 98.1 F (36.7 C) (Oral)   Resp 16   SpO2 97%   Physical Exam  Constitutional: She is oriented to person, place, and time. She appears well-developed and well-nourished.  Appears uncomfortable and in pain  HENT:  Head: Normocephalic and atraumatic.  Right Ear: External ear normal.  Left Ear: External ear normal.  Nose: Nose normal.  Eyes: Right eye exhibits no discharge. Left eye exhibits no discharge.  Cardiovascular: Normal rate, regular rhythm and normal heart sounds.   Pulmonary/Chest: Effort normal and breath sounds normal.  Abdominal: Soft. There is tenderness in the suprapubic area.  Neurological: She is alert and oriented to  person, place, and time.  Skin: Skin is warm and dry.  Nursing note and vitals reviewed.    ED Treatments / Results  Labs (all labs ordered are listed, but only abnormal results are displayed) Labs Reviewed  COMPREHENSIVE  METABOLIC PANEL - Abnormal; Notable for the following:       Result Value   Glucose, Bld 130 (*)    All other components within normal limits  URINALYSIS, ROUTINE W REFLEX MICROSCOPIC - Abnormal; Notable for the following:    Color, Urine AMBER (*)    Nitrite POSITIVE (*)    All other components within normal limits  URINE CULTURE  CBC    EKG  EKG Interpretation None       Radiology No results found.  Procedures Procedures (including critical care time)  Medications Ordered in ED Medications - No data to display   Initial Impression / Assessment and Plan / ED Course  I have reviewed the triage vital signs and the nursing notes.  Pertinent labs & imaging results that were available during my care of the patient were reviewed by me and considered in my medical decision making (see chart for details).     Patient's symptoms have completely resolved after placement of a leak catheter with a large amount of urine coming out. She had over 800 mL on bladder scan. No clear etiology but this is happened to her multiple times in the past. She has been on Rapaflo in the past, start this again and follow-up with urology.As for her urinalysis, she has positive nitrites but no other concerning findings including no leukocytes or bacteria. Given this, I think this is likely a false positive from Azo and thinks that is this is unlikely to be UTI.Will send for culture. Otherwise lab work is unremarkable, follow-up with urology and discharged with Foley and leg bag.  Final Clinical Impressions(s) / ED Diagnoses   Final diagnoses:  Acute urinary retention    New Prescriptions New Prescriptions   No medications on file     Sherwood Gambler, MD 10/19/16  1910

## 2016-10-20 LAB — URINE CULTURE
Culture: NO GROWTH
Special Requests: NORMAL

## 2016-10-25 DIAGNOSIS — M5441 Lumbago with sciatica, right side: Secondary | ICD-10-CM | POA: Diagnosis not present

## 2016-10-25 DIAGNOSIS — R339 Retention of urine, unspecified: Secondary | ICD-10-CM | POA: Diagnosis not present

## 2016-10-26 DIAGNOSIS — R339 Retention of urine, unspecified: Secondary | ICD-10-CM | POA: Diagnosis not present

## 2016-11-03 ENCOUNTER — Ambulatory Visit (INDEPENDENT_AMBULATORY_CARE_PROVIDER_SITE_OTHER): Payer: PPO | Admitting: Ophthalmology

## 2016-11-06 DIAGNOSIS — M25551 Pain in right hip: Secondary | ICD-10-CM | POA: Diagnosis not present

## 2016-11-06 DIAGNOSIS — G894 Chronic pain syndrome: Secondary | ICD-10-CM | POA: Diagnosis not present

## 2016-11-06 DIAGNOSIS — Z79891 Long term (current) use of opiate analgesic: Secondary | ICD-10-CM | POA: Diagnosis not present

## 2016-11-06 DIAGNOSIS — M150 Primary generalized (osteo)arthritis: Secondary | ICD-10-CM | POA: Diagnosis not present

## 2016-11-09 ENCOUNTER — Other Ambulatory Visit: Payer: Self-pay | Admitting: Family Medicine

## 2016-11-09 DIAGNOSIS — R3914 Feeling of incomplete bladder emptying: Secondary | ICD-10-CM | POA: Diagnosis not present

## 2016-11-09 DIAGNOSIS — N312 Flaccid neuropathic bladder, not elsewhere classified: Secondary | ICD-10-CM | POA: Diagnosis not present

## 2016-11-15 DIAGNOSIS — M5441 Lumbago with sciatica, right side: Secondary | ICD-10-CM | POA: Diagnosis not present

## 2016-11-16 ENCOUNTER — Encounter (INDEPENDENT_AMBULATORY_CARE_PROVIDER_SITE_OTHER): Payer: PPO | Admitting: Ophthalmology

## 2016-11-16 DIAGNOSIS — H43813 Vitreous degeneration, bilateral: Secondary | ICD-10-CM

## 2016-11-16 DIAGNOSIS — H353131 Nonexudative age-related macular degeneration, bilateral, early dry stage: Secondary | ICD-10-CM

## 2016-11-16 DIAGNOSIS — H35033 Hypertensive retinopathy, bilateral: Secondary | ICD-10-CM

## 2016-11-16 DIAGNOSIS — M797 Fibromyalgia: Secondary | ICD-10-CM | POA: Diagnosis not present

## 2016-11-16 DIAGNOSIS — H2513 Age-related nuclear cataract, bilateral: Secondary | ICD-10-CM | POA: Diagnosis not present

## 2016-11-16 DIAGNOSIS — I10 Essential (primary) hypertension: Secondary | ICD-10-CM

## 2016-11-27 DIAGNOSIS — M545 Low back pain: Secondary | ICD-10-CM | POA: Diagnosis not present

## 2016-11-27 DIAGNOSIS — M5416 Radiculopathy, lumbar region: Secondary | ICD-10-CM | POA: Diagnosis not present

## 2016-11-27 DIAGNOSIS — Z96641 Presence of right artificial hip joint: Secondary | ICD-10-CM | POA: Diagnosis not present

## 2016-12-01 DIAGNOSIS — Z96641 Presence of right artificial hip joint: Secondary | ICD-10-CM | POA: Diagnosis not present

## 2016-12-01 DIAGNOSIS — M150 Primary generalized (osteo)arthritis: Secondary | ICD-10-CM | POA: Diagnosis not present

## 2016-12-01 DIAGNOSIS — Z23 Encounter for immunization: Secondary | ICD-10-CM | POA: Diagnosis not present

## 2016-12-01 DIAGNOSIS — Z96651 Presence of right artificial knee joint: Secondary | ICD-10-CM | POA: Diagnosis not present

## 2016-12-01 DIAGNOSIS — M5030 Other cervical disc degeneration, unspecified cervical region: Secondary | ICD-10-CM | POA: Diagnosis not present

## 2016-12-01 DIAGNOSIS — M797 Fibromyalgia: Secondary | ICD-10-CM | POA: Diagnosis not present

## 2016-12-01 DIAGNOSIS — Z79899 Other long term (current) drug therapy: Secondary | ICD-10-CM | POA: Diagnosis not present

## 2016-12-01 DIAGNOSIS — Z87891 Personal history of nicotine dependence: Secondary | ICD-10-CM | POA: Diagnosis not present

## 2016-12-01 DIAGNOSIS — M5136 Other intervertebral disc degeneration, lumbar region: Secondary | ICD-10-CM | POA: Diagnosis not present

## 2016-12-01 DIAGNOSIS — M359 Systemic involvement of connective tissue, unspecified: Secondary | ICD-10-CM | POA: Diagnosis not present

## 2016-12-04 DIAGNOSIS — M4726 Other spondylosis with radiculopathy, lumbar region: Secondary | ICD-10-CM | POA: Diagnosis not present

## 2016-12-04 DIAGNOSIS — I10 Essential (primary) hypertension: Secondary | ICD-10-CM | POA: Diagnosis not present

## 2016-12-04 DIAGNOSIS — Z6829 Body mass index (BMI) 29.0-29.9, adult: Secondary | ICD-10-CM | POA: Diagnosis not present

## 2016-12-13 DIAGNOSIS — M1611 Unilateral primary osteoarthritis, right hip: Secondary | ICD-10-CM | POA: Diagnosis not present

## 2016-12-13 DIAGNOSIS — Z96641 Presence of right artificial hip joint: Secondary | ICD-10-CM | POA: Diagnosis not present

## 2016-12-20 DIAGNOSIS — M150 Primary generalized (osteo)arthritis: Secondary | ICD-10-CM | POA: Diagnosis not present

## 2016-12-20 DIAGNOSIS — M25551 Pain in right hip: Secondary | ICD-10-CM | POA: Diagnosis not present

## 2016-12-20 DIAGNOSIS — G894 Chronic pain syndrome: Secondary | ICD-10-CM | POA: Diagnosis not present

## 2016-12-20 DIAGNOSIS — Z79891 Long term (current) use of opiate analgesic: Secondary | ICD-10-CM | POA: Diagnosis not present

## 2017-01-01 ENCOUNTER — Other Ambulatory Visit: Payer: Self-pay | Admitting: Cardiovascular Disease

## 2017-01-22 ENCOUNTER — Telehealth: Payer: Self-pay

## 2017-01-22 NOTE — Telephone Encounter (Signed)
Pt called about ears clogged . appointment was  Made. [place]

## 2017-01-29 ENCOUNTER — Encounter: Payer: Self-pay | Admitting: Family Medicine

## 2017-01-29 ENCOUNTER — Ambulatory Visit (INDEPENDENT_AMBULATORY_CARE_PROVIDER_SITE_OTHER): Payer: PPO | Admitting: Family Medicine

## 2017-01-29 VITALS — BP 124/82 | HR 96 | Wt 181.2 lb

## 2017-01-29 DIAGNOSIS — H6123 Impacted cerumen, bilateral: Secondary | ICD-10-CM

## 2017-01-29 NOTE — Progress Notes (Signed)
   Subjective:    Patient ID: [name] Long, female    DOB: [birth year], 77 y.o.   MRN: [id number]  HPI She is here for evaluation and treatment of cerumen impaction.  She does wear hearing aids and states she has noted no difference but when she went to have her hearing aids checked, the audiologist said she was impacted but was unable to help.   Review of Systems     Objective:   Physical Exam Alert and in no distress.  Both canals are filled with cerumen.       Assessment & Plan:  Bilateral impacted cerumen An attempt was made to lavage from the right canal with no success.  Recommend she use Cerumenex and return here to have this lavage.  This will need to wait till next week as she is getting ready to travel to [place].

## 2017-01-29 NOTE — Patient Instructions (Signed)
use Cerumenex prior coming in

## 2017-02-09 ENCOUNTER — Other Ambulatory Visit: Payer: Self-pay | Admitting: Family Medicine

## 2017-02-12 DIAGNOSIS — G894 Chronic pain syndrome: Secondary | ICD-10-CM | POA: Diagnosis not present

## 2017-02-12 DIAGNOSIS — M25551 Pain in right hip: Secondary | ICD-10-CM | POA: Diagnosis not present

## 2017-02-12 DIAGNOSIS — M150 Primary generalized (osteo)arthritis: Secondary | ICD-10-CM | POA: Diagnosis not present

## 2017-02-12 DIAGNOSIS — Z79891 Long term (current) use of opiate analgesic: Secondary | ICD-10-CM | POA: Diagnosis not present

## 2017-02-14 ENCOUNTER — Ambulatory Visit (INDEPENDENT_AMBULATORY_CARE_PROVIDER_SITE_OTHER): Payer: PPO | Admitting: Family Medicine

## 2017-02-14 VITALS — BP 132/78 | HR 102 | Wt 181.0 lb

## 2017-02-14 DIAGNOSIS — H6123 Impacted cerumen, bilateral: Secondary | ICD-10-CM | POA: Diagnosis not present

## 2017-02-14 NOTE — Progress Notes (Signed)
   Subjective:    Patient ID: [name] Long, female    DOB: [birth year], 77 y.o.   MRN: [id number]  HPI She is here for continued difficulty with hearing from both ears.   Review of Systems     Objective:   Physical Exam Both canals did have cerumen in it.       Assessment & Plan:  Bilateral impacted cerumen Cerumen was lavaged and also manually removed without difficulty.  Tympanic membranes and canals were normal.

## 2017-03-06 ENCOUNTER — Other Ambulatory Visit: Payer: Self-pay | Admitting: Family Medicine

## 2017-03-06 DIAGNOSIS — E785 Hyperlipidemia, unspecified: Secondary | ICD-10-CM

## 2017-05-01 ENCOUNTER — Other Ambulatory Visit: Payer: Self-pay | Admitting: Family Medicine

## 2017-05-01 NOTE — Telephone Encounter (Signed)
Refill denied. Pt has completed course of treatment.  

## 2017-05-07 DIAGNOSIS — M25551 Pain in right hip: Secondary | ICD-10-CM | POA: Diagnosis not present

## 2017-05-07 DIAGNOSIS — M150 Primary generalized (osteo)arthritis: Secondary | ICD-10-CM | POA: Diagnosis not present

## 2017-05-07 DIAGNOSIS — Z79891 Long term (current) use of opiate analgesic: Secondary | ICD-10-CM | POA: Diagnosis not present

## 2017-05-07 DIAGNOSIS — G894 Chronic pain syndrome: Secondary | ICD-10-CM | POA: Diagnosis not present

## 2017-05-15 ENCOUNTER — Ambulatory Visit: Payer: PPO | Admitting: Cardiovascular Disease

## 2017-05-15 ENCOUNTER — Encounter: Payer: Self-pay | Admitting: Cardiovascular Disease

## 2017-05-15 VITALS — BP 130/78 | HR 89 | Ht 64.0 in | Wt 182.0 lb

## 2017-05-15 DIAGNOSIS — I10 Essential (primary) hypertension: Secondary | ICD-10-CM

## 2017-05-15 DIAGNOSIS — E782 Mixed hyperlipidemia: Secondary | ICD-10-CM

## 2017-05-15 DIAGNOSIS — Z951 Presence of aortocoronary bypass graft: Secondary | ICD-10-CM

## 2017-05-15 NOTE — Progress Notes (Signed)
05/15/2017 [name] Long   07-03-1940  [id number]  Primary Physician Denita Lung, MD Primary Cardiologist: Lorretta Harp MD FACP, [place], [place], [place]  HPI:  [name] Long is a 77 y.o.  mildly overweight married Caucasian female, mother of 2, grandmother to 6 grandchildren, who I last saw in the office  01/14/2016.. She ended up being admitted January 12, 2011, with chest pain and was catheterized, revealing a left main 3-vessel disease with an EF of 50% and high anterior hypokinesia, for which she underwent coronary artery bypass grafting by Dr. Tharon Aquas Trigt with a LIMA to her LAD, vein to an obtuse marginal branch and to the right coronary artery. Postop course was uncomplicated. A Myoview in April was normal. Her lipid profile was excellent. She saw Cecilie Kicks in our office on July 15 complaining of chest pain, and the concern was musculoskeletal versus pericarditis. A 2D echocardiogram was entirely normal. She ended up seeing Dr. Prescott Gum, who diagnosed a staph sternal wound infection, requiring hospitalization and debridement. She had a wound VAC on and fortunately has completely healed. Since I saw her in the office a year and a half ago she was complaining of some increased dyspnea on exertion and decreased exercise tolerance.  A Myoview stress test was normal as was a 2D echocardiogram.  It turns out that her increasing shortness of breath is probably related to exacerbation of her asthma at that time.    Current Meds  Medication Sig  . albuterol (PROVENTIL HFA) 108 (90 Base) MCG/ACT inhaler INHALE 2 PUFF AS DIRECTED FOUR TIMES A DAY AS NEEDED (Patient taking differently: Inhale 2 puffs into the lungs every 6 (six) hours as needed for wheezing or shortness of breath. INHALE 2 PUFF AS DIRECTED FOUR TIMES A DAY AS NEEDED)  . aspirin EC 325 MG EC tablet Take 1 tablet (325 mg total) by mouth 2 (two) times daily after a meal.  . aspirin EC 81 MG tablet Take 81 mg by mouth daily.  Marland Kitchen  atorvastatin (LIPITOR) 20 MG tablet TAKE ONE TABLET BY MOUTH DAILY **MUST CALL MD FOR APPOINTMENT  . butalbital-acetaminophen-caffeine (FIORICET, ESGIC) 50-325-40 MG tablet Take 1 tablet by mouth every 6 (six) hours as needed for headache.   . Calcium Carbonate-Vit D-Min (CALCIUM 1200 PO) Take 1 tablet by mouth daily.  . carisoprodol (SOMA) 350 MG tablet Take 350 mg by mouth 2 (two) times daily as needed for muscle spasms.   . cholecalciferol (VITAMIN D) 1000 units tablet Take 1,000 Units by mouth daily.  . Fluticasone-Salmeterol (ADVAIR DISKUS) 500-50 MCG/DOSE AEPB Inhale 1 puff into the lungs 2 (two) times daily.  Marland Kitchen gabapentin (NEURONTIN) 600 MG tablet Take 1,800 mg by mouth 2 (two) times daily. Takes 3 tablets at noon and 3 tablets at bedtime  . HYDROmorphone (DILAUDID) 4 MG tablet Take 4 mg by mouth 3 (three) times daily as needed for severe pain. For pain   . hydroxychloroquine (PLAQUENIL) 200 MG tablet Take 200 mg by mouth every other day.   . magnesium oxide (MAG-OX) 400 MG tablet Take 400 mg by mouth at bedtime.  . metoprolol tartrate (LOPRESSOR) 25 MG tablet TAKE ONE TABLET BY MOUTH TWICE A DAY  . montelukast (SINGULAIR) 10 MG tablet TAKE 1 TABLET BY MOUTH  ONCE A DAY (Patient taking differently: Take 10 mg by mouth at bedtime. )  . Polyethyl Glycol-Propyl Glycol (SYSTANE OP) Apply 1 drop to eye as needed (DRY EYES).   Marland Kitchen  Vitamin D, Ergocalciferol, (DRISDOL) 50000 units CAPS capsule TAKE 1 CAPSULE BY MOUTH EVERY 7 DAYS     Allergies  Allergen Reactions  . Morphine And Related Nausea And Vomiting  . Percocet [Oxycodone-Acetaminophen] Other (See Comments)  . Zolpidem Other (See Comments)    Unusual behavior    Social History   Socioeconomic History  . Marital status: Married    Spouse name: Not on file  . Number of children: Not on file  . Years of education: Not on file  . Highest education level: Not on file  Occupational History  . Not on file  Social Needs  . Financial  resource strain: Not on file  . Food insecurity:    Worry: Not on file    Inability: Not on file  . Transportation needs:    Medical: Not on file    Non-medical: Not on file  Tobacco Use  . Smoking status: Former Smoker    Last attempt to quit: 01/12/1980    Years since quitting: 37.3  . Smokeless tobacco: Never Used  Substance and Sexual Activity  . Alcohol use: No    Alcohol/week: 0.0 oz  . Drug use: No  . Sexual activity: Yes  Lifestyle  . Physical activity:    Days per week: Not on file    Minutes per session: Not on file  . Stress: Not on file  Relationships  . Social connections:    Talks on phone: Not on file    Gets together: Not on file    Attends religious service: Not on file    Active member of club or organization: Not on file    Attends meetings of clubs or organizations: Not on file    Relationship status: Not on file  . Intimate partner violence:    Fear of current or ex partner: Not on file    Emotionally abused: Not on file    Physically abused: Not on file    Forced sexual activity: Not on file  Other Topics Concern  . Not on file  Social History Narrative  . Not on file     Review of Systems: General: negative for chills, fever, night sweats or weight changes.  Cardiovascular: negative for chest pain, dyspnea on exertion, edema, orthopnea, palpitations, paroxysmal nocturnal dyspnea or shortness of breath Dermatological: negative for rash Respiratory: negative for cough or wheezing Urologic: negative for hematuria Abdominal: negative for nausea, vomiting, diarrhea, bright red blood per rectum, melena, or hematemesis Neurologic: negative for visual changes, syncope, or dizziness All other systems reviewed and are otherwise negative except as noted above.    Blood pressure 130/78, pulse 89, height 5\' 4"  (1.626 m), weight 182 lb (82.6 kg).  General appearance: alert and no distress Neck: no adenopathy, no carotid bruit, no JVD, supple,  symmetrical, trachea midline and thyroid not enlarged, symmetric, no tenderness/mass/nodules Lungs: clear to auscultation bilaterally Heart: regular rate and rhythm, S1, S2 normal, no murmur, click, rub or gallop Extremities: extremities normal, atraumatic, no cyanosis or edema Pulses: 2+ and symmetric Skin: Skin color, texture, turgor normal. No rashes or lesions Neurologic: Alert and oriented X 3, normal strength and tone. Normal symmetric reflexes. Normal coordination and gait  EKG sinus rhythm at 89 diffuse T wave inversion both inferior and anterolateral unchanged from prior EKGs.  I personally reviewed this EKG.  ASSESSMENT AND PLAN:   S/P CABG x 3: (lima-lad,svg-om,svg-rca) History of CAD status post coronary artery bypass grafting 01/12/2011 by Dr. Tharon Aquas trigt to  the LAD, vein to the obtuse marginal branch and right coronary artery.  She did have a Myoview stress test 12/28/2015 which is low risk and nonischemic.  She denies chest pain.  Hyperlipidemia History of hyperlipidemia on statin therapy.  We will recheck a lipid and liver profile  Essential hypertension History of essential hypertension with blood pressure initially measured at 148/90 follow-up measurement of 130/78 on metoprolol.  Continue current meds at current dosing.      Lorretta Harp MD FACP,FACC,FAHA, [facility] 05/15/2017 9:11 AM

## 2017-05-15 NOTE — Assessment & Plan Note (Signed)
History of CAD status post coronary artery bypass grafting 01/12/2011 by Dr. Tharon Aquas trigt to the LAD, vein to the obtuse marginal branch and right coronary artery.  She did have a Myoview stress test 12/28/2015 which is low risk and nonischemic.  She denies chest pain.

## 2017-05-15 NOTE — Patient Instructions (Signed)
Medication Instructions:  Your physician recommends that you continue on your current medications as directed. Please refer to the Current Medication list given to you today.   Labwork: Your physician recommends that you return for lab work in: FASTING lipid/liver   Testing/Procedures: none  Follow-Up: Your physician wants you to follow-up in: 12 months with Dr. Gwenlyn Found. You will receive a reminder letter in the mail two months in advance. If you don't receive a letter, please call our office to schedule the follow-up appointment.   Any Other Special Instructions Will Be Listed Below (If Applicable).     If you need a refill on your cardiac medications before your next appointment, please call your pharmacy.

## 2017-05-15 NOTE — Assessment & Plan Note (Signed)
History of hyperlipidemia on statin therapy. We will recheck a lipid and liver profile 

## 2017-05-15 NOTE — Assessment & Plan Note (Signed)
History of essential hypertension with blood pressure initially measured at 148/90 follow-up measurement of 130/78 on metoprolol.  Continue current meds at current dosing.

## 2017-05-17 DIAGNOSIS — E782 Mixed hyperlipidemia: Secondary | ICD-10-CM | POA: Diagnosis not present

## 2017-05-17 LAB — LIPID PANEL
CHOL/HDL RATIO: 1.7 ratio (ref 0.0–4.4)
Cholesterol, Total: 150 mg/dL (ref 100–199)
HDL: 90 mg/dL (ref >39)
LDL CALC: 52 mg/dL (ref 0–99)
Triglycerides: 42 mg/dL (ref 0–149)
VLDL Cholesterol Cal: 8 mg/dL (ref 5–40)

## 2017-05-17 LAB — HEPATIC FUNCTION PANEL
ALK PHOS: 115 IU/L (ref 39–117)
ALT: 20 IU/L (ref 0–32)
AST: 22 IU/L (ref 0–40)
Albumin: 4.5 g/dL (ref 3.5–4.8)
BILIRUBIN TOTAL: 0.3 mg/dL (ref 0.0–1.2)
Bilirubin, Direct: 0.09 mg/dL (ref 0.00–0.40)
TOTAL PROTEIN: 6.8 g/dL (ref 6.0–8.5)

## 2017-05-30 NOTE — Addendum Note (Signed)
Addended by: Therisa Doyne on: 05/30/2017 03:08 PM   Modules accepted: Orders

## 2017-06-02 ENCOUNTER — Other Ambulatory Visit: Payer: Self-pay

## 2017-06-02 ENCOUNTER — Emergency Department (HOSPITAL_BASED_OUTPATIENT_CLINIC_OR_DEPARTMENT_OTHER)
Admission: EM | Admit: 2017-06-02 | Discharge: 2017-06-02 | Disposition: A | Discharge status: Home / Self Care | Payer: PPO | Attending: Physician Assistant | Admitting: Physician Assistant

## 2017-06-02 ENCOUNTER — Encounter (HOSPITAL_BASED_OUTPATIENT_CLINIC_OR_DEPARTMENT_OTHER): Payer: Self-pay | Admitting: Emergency Medicine

## 2017-06-02 DIAGNOSIS — Z79899 Other long term (current) drug therapy: Secondary | ICD-10-CM | POA: Diagnosis not present

## 2017-06-02 DIAGNOSIS — I10 Essential (primary) hypertension: Secondary | ICD-10-CM | POA: Diagnosis not present

## 2017-06-02 DIAGNOSIS — I2510 Atherosclerotic heart disease of native coronary artery without angina pectoris: Secondary | ICD-10-CM | POA: Insufficient documentation

## 2017-06-02 DIAGNOSIS — Z87891 Personal history of nicotine dependence: Secondary | ICD-10-CM | POA: Diagnosis not present

## 2017-06-02 DIAGNOSIS — Z7982 Long term (current) use of aspirin: Secondary | ICD-10-CM | POA: Diagnosis not present

## 2017-06-02 DIAGNOSIS — J45909 Unspecified asthma, uncomplicated: Secondary | ICD-10-CM | POA: Diagnosis not present

## 2017-06-02 DIAGNOSIS — R339 Retention of urine, unspecified: Secondary | ICD-10-CM | POA: Diagnosis not present

## 2017-06-02 DIAGNOSIS — I252 Old myocardial infarction: Secondary | ICD-10-CM | POA: Insufficient documentation

## 2017-06-02 DIAGNOSIS — N3001 Acute cystitis with hematuria: Secondary | ICD-10-CM

## 2017-06-02 LAB — URINALYSIS, ROUTINE W REFLEX MICROSCOPIC
BILIRUBIN URINE: NEGATIVE
Glucose, UA: NEGATIVE mg/dL
Ketones, ur: NEGATIVE mg/dL
NITRITE: POSITIVE — AB
PH: 6.5 (ref 5.0–8.0)
Protein, ur: NEGATIVE mg/dL
SPECIFIC GRAVITY, URINE: 1.01 (ref 1.005–1.030)

## 2017-06-02 LAB — URINALYSIS, MICROSCOPIC (REFLEX)

## 2017-06-02 MED ORDER — CEPHALEXIN 500 MG PO CAPS: 500.0000 mg | ORAL_CAPSULE | Freq: Four times a day (QID) | ORAL | 0 refills | Status: AC

## 2017-06-02 NOTE — ED Notes (Signed)
Leg bag applied prior to dc

## 2017-06-02 NOTE — Discharge Instructions (Signed)
You have signs of a urinary tract infection, which may have caused your urinary retention. Take the antibiotic as prescribed, and be sure to call your urologist for a followup appt within 14 days. I sent a [facility] referral to help with med affordability.

## 2017-06-02 NOTE — ED Triage Notes (Signed)
Pt sts unable to void since "last night sometime"; this happens frequently and she has to have an indwelling catheter

## 2017-06-02 NOTE — ED Notes (Signed)
Pt verbalized understanding of dc instructions.

## 2017-06-02 NOTE — ED Provider Notes (Signed)
[facility] EMERGENCY DEPARTMENT Provider Note   CSN: [id number] Arrival date & time: 06/02/17  9983     History   Chief Complaint Chief Complaint  Patient presents with  . Urinary Retention    HPI [name] Long is a 77 y.o. female.  HPI  Urinary Retention: Patient complains of urinary retention. She has had urinary retention several times over the last few years, 3 episodes this year. follows with [facility], has an appt next month. She states previously they have been unable to determine the cause of the urinary retention, despite scopes and workup. Hx of bladder surgery intraabdominally many years ago. This episode began at 4am when she awoke and was unable to void. Last void was 11pm yesterday and normal in apperance. Denies fever. No dysuria, itching or burning. No hx of recurrent UTIs Bladder scan by nursing with 877ml. >1L of urine were drained when catheter was placed. States she is supposed to be on rapaflo but has not been for >1 month as this costs $100 and her husband also has several costly medications. Denies constipation, states last BM was several small normal appearing BMs this am. Home meds which could be exacerbating retention include her soma, states she takes this about once per week. Also on dilaudid and fioricet, which she uses several times per week.   Past Medical History:  Diagnosis Date  . Abnormal EKG 01/12/2011  . Allergy    RHINITIS  . Arthritis   . Asthma   . Asthma 01/12/2011  . Back pain, chronic--plans were for cervical disc surgery week of 01/16/11 01/12/2011  . Blood clot in vein 2004   L leg  . Blood transfusion without reported diagnosis 2004  . Chronic low back pain   . Complication of anesthesia   . Connective tissue disease ([place])   . Coronary artery disease   . Crescendo angina, with EKG changes, new. 01/12/2011  . Dyspnea   . Fibromyalgia   . Fibromyalgia   . Headache    migraine - several in a yr.   . History of stress  test 04/2011   Normal stress test  . Hx of echocardiogram 07/19/2011   EF 50-55%. There is mild annular calcification, there is trace migtral regurgitation, there is trace tricupid regurgitation, pericardium is thick, there is no pericardial effusion.  . Hypertension   . Myocardial infarction [facility]) 2013   triple heart bypass  . Neuromuscular disorder (HCC)    fibromyalgia   . Osteoporosis    OSTEOPENIA  . Personal history of colonic polyp - adenoma 08/05/2013   08/2013 - 7 mm adenoma - consider repeat colonoscopy 2020 (age 72)  . Pneumonia    numerous times, only admitted one time for pneumonia but she has had pneumonia numerous times  . PONV (postoperative nausea and vomiting)   . PPD positive   . Status post coronary artery bypass grafting   . Urinary incontinence    post op, "sleepy bladder", urinar retention     Patient Active Problem List   Diagnosis Date Noted  . Essential hypertension 05/15/2017  . Primary localized osteoarthritis of right hip 08/22/2016  . Primary osteoarthritis of right hip 08/22/2016  . Arthritis of right hip 05/01/2016  . Lumbar radiculopathy, right 03/06/2016  . Personal history of colonic polyp - adenoma 08/05/2013  . GERD (gastroesophageal reflux disease) 03/11/2013  . Rheumatoid arthritis ([place]) 03/11/2013  . High frequency hearing loss 03/11/2013  . Tinnitus 03/11/2013  . Hyperlipidemia 08/21/2012  .  S/P CABG x 3: (lima-lad,svg-om,svg-rca) 01/16/2011  . CAD (coronary artery disease): multivessel, good LVF 50% at cath 01/16/2011  . Back pain, chronic--plans were for cervical disc surgery week of 01/16/11 01/12/2011  . Asthma, chronic 01/12/2011  . Fibromyalgia 01/12/2011    Past Surgical History:  Procedure Laterality Date  . ABDOMINAL HYSTERECTOMY  1981  . APPENDECTOMY  1946  . BLADDER REPAIR  1985  . BREAST SURGERY  1992   REDUCTION, x3 surgeries overall on breast for augmentation   . CARDIAC CATHETERIZATION  2013  . CARPAL TUNNEL RELEASE  Bilateral 1988, 1999  . [place]  . CORONARY ARTERY BYPASS GRAFT  01/14/2011   Procedure: CORONARY ARTERY BYPASS GRAFTING (CABG);  Surgeon: Tharon Aquas Adelene Idler, MD;  Location: [facility];  Service: Open Heart Surgery;  Laterality: N/A;  CABG x three, using right greater saphenous vein harvested endoscopically  . EYE SURGERY Bilateral 1985   radiokerototomy  . LEFT HEART CATHETERIZATION WITH CORONARY ANGIOGRAM N/A 01/13/2011   Procedure: LEFT HEART CATHETERIZATION WITH CORONARY ANGIOGRAM;  Surgeon: Troy Sine, MD;  Location[facility];  Service: Cardiovascular;  Laterality: N/A;  . PUBOVAGINAL SLING  2002  . STERNAL WOUND DEBRIDEMENT  07/27/2011   Procedure: STERNAL WOUND DEBRIDEMENT;  followed by wound vac Surgeon: Ivin Poot, MD;  Location: [facility];  Service: Open Heart Surgery;  Laterality: N/A;  . TONSILLECTOMY  1952  . TOTAL HIP ARTHROPLASTY Right 08/22/2016   Procedure: TOTAL HIP ARTHROPLASTY ANTERIOR APPROACH;  Surgeon: Melrose Nakayama, MD;  Location: [facility];  Service: Orthopedics;  Laterality: Right;  . TRIGGER FINGER RELEASE Right 2014   4th finger  . TROCHANTERIC BURSA EXCISION Right    bursa removed, R hip  . TUBAL LIGATION       OB History   None      Home Medications    Prior to Admission medications   Medication Sig Start Date End Date Taking? Authorizing Provider  albuterol (PROVENTIL HFA) 108 (90 Base) MCG/ACT inhaler INHALE 2 PUFF AS DIRECTED FOUR TIMES A DAY AS NEEDED Patient taking differently: Inhale 2 puffs into the lungs every 6 (six) hours as needed for wheezing or shortness of breath. INHALE 2 PUFF AS DIRECTED FOUR TIMES A DAY AS NEEDED 07/09/15   Denita Lung, MD  aspirin EC 325 MG EC tablet Take 1 tablet (325 mg total) by mouth 2 (two) times daily after a meal. 08/25/16   Loni Dolly, PA-C  aspirin EC 81 MG tablet Take 81 mg by mouth daily.    [provider]  atorvastatin (LIPITOR) 20 MG tablet TAKE ONE TABLET BY MOUTH  DAILY **MUST CALL MD FOR APPOINTMENT 03/06/17   Denita Lung, MD  butalbital-acetaminophen-caffeine (FIORICET, ESGIC) [phone] MG tablet Take 1 tablet by mouth every 6 (six) hours as needed for headache.     [provider]  Calcium Carbonate-Vit D-Min (CALCIUM 1200 PO) Take 1 tablet by mouth daily.    [provider]  carisoprodol (SOMA) 350 MG tablet Take 350 mg by mouth 2 (two) times daily as needed for muscle spasms.     [provider]  cephALEXin (KEFLEX) 500 MG capsule Take 1 capsule (500 mg total) by mouth 4 (four) times daily for 7 days. Take for 7 days 06/02/17 06/09/17  Sela Hilding, MD  cholecalciferol (VITAMIN D) 1000 units tablet Take 1,000 Units by mouth daily.    [provider]  Fluticasone-Salmeterol (ADVAIR DISKUS) 500-50 MCG/DOSE  AEPB Inhale 1 puff into the lungs 2 (two) times daily. 07/31/16   Denita Lung, MD  gabapentin (NEURONTIN) 600 MG tablet Take 1,800 mg by mouth 2 (two) times daily. Takes 3 tablets at noon and 3 tablets at bedtime    [provider]  HYDROmorphone (DILAUDID) 4 MG tablet Take 4 mg by mouth 3 (three) times daily as needed for severe pain. For pain     [provider]  hydroxychloroquine (PLAQUENIL) 200 MG tablet Take 200 mg by mouth every other day.     [provider]  magnesium oxide (MAG-OX) 400 MG tablet Take 400 mg by mouth at bedtime.    [provider]  metoprolol tartrate (LOPRESSOR) 25 MG tablet TAKE ONE TABLET BY MOUTH TWICE A DAY 01/01/17   Lorretta Harp, MD  montelukast (SINGULAIR) 10 MG tablet TAKE 1 TABLET BY MOUTH  ONCE A DAY Patient taking differently: Take 10 mg by mouth at bedtime.  07/31/16   Denita Lung, MD  Polyethyl Glycol-Propyl Glycol (SYSTANE OP) Apply 1 drop to eye as needed (DRY EYES).     [provider]  Vitamin D, Ergocalciferol, (DRISDOL) 50000 units CAPS capsule TAKE 1 CAPSULE BY MOUTH EVERY 7 DAYS 02/09/17   Lyndal Pulley, DO     Family History Family History  Adopted: Yes  Problem Relation Age of Onset  . Hypertension Brother     Social History Social History   Tobacco Use  . Smoking status: Former Smoker    Last attempt to quit: 01/12/1980    Years since quitting: 37.4  . Smokeless tobacco: Never Used  Substance Use Topics  . Alcohol use: No    Alcohol/week: 0.0 oz  . Drug use: No     Allergies   Morphine and related; Percocet [oxycodone-acetaminophen]; and Zolpidem   Review of Systems Review of Systems  Constitutional: Negative for activity change, chills and fever.  Respiratory: Negative for chest tightness and shortness of breath.   Cardiovascular: Negative for chest pain.  Gastrointestinal: Positive for abdominal pain. Negative for constipation.  Endocrine: Negative for polyuria.  Genitourinary: Positive for decreased urine volume. Negative for dysuria and urgency.  Psychiatric/Behavioral: Negative for confusion.     Physical Exam Updated Vital Signs BP 121/69 (BP Location: Right Arm)   Pulse 63   Temp 97.8 F (36.6 C) (Oral)   Resp 16   Ht 5\' 4"  (1.626 m)   Wt 81.6 kg (180 lb)   SpO2 97%   BMI 30.90 kg/m   Physical Exam  Constitutional: She is oriented to person, place, and time. She appears well-developed and well-nourished. No distress.  HENT:  Head: Normocephalic and atraumatic.  Eyes: Conjunctivae and EOM are normal.  Cardiovascular: Normal rate, regular rhythm and normal heart sounds.  No murmur heard. Pulmonary/Chest: Effort normal and breath sounds normal.  Abdominal:  Initially distended with tenderness to mild palpation prior to foley placement, reexamined after foley placement with complete resolution of pain and distention.   Musculoskeletal: Normal range of motion.  Neurological: She is alert and oriented to person, place, and time.  Skin: Skin is warm and dry.  Psychiatric: She has a normal mood and affect. Her behavior is normal.     ED Treatments /  Results  Labs (all labs ordered are listed, but only abnormal results are displayed) Labs Reviewed  URINALYSIS, ROUTINE W REFLEX MICROSCOPIC - Abnormal; Notable for the following components:      Result Value   Color,  Urine STRAW (*)    APPearance HAZY (*)    Hgb urine dipstick TRACE (*)    Nitrite POSITIVE (*)    Leukocytes, UA TRACE (*)    All other components within normal limits  URINALYSIS, MICROSCOPIC (REFLEX) - Abnormal; Notable for the following components:   Bacteria, UA MANY (*)    All other components within normal limits  URINE CULTURE    EKG None  Radiology No results found.  Procedures Procedures (including critical care time)  Medications Ordered in ED Medications - No data to display   Initial Impression / Assessment and Plan / ED Course  I have reviewed the triage vital signs and the nursing notes.  Pertinent labs & imaging results that were available during my care of the patient were reviewed by me and considered in my medical decision making (see chart for details).     Acute urinary retention, bladder scan with 877cc. Foley placed, pain completely resolved. UA with signs of UTI, will treat with keflex. Asked her to make sure she sees urology within 14 days. Will discharge with foley in place. Return if worsening. Referred to [facility] for med affordability.   Final Clinical Impressions(s) / ED Diagnoses   Final diagnoses:  Urinary retention  Acute cystitis with hematuria    ED Discharge Orders        Ordered    cephALEXin (KEFLEX) 500 MG capsule  4 times daily     06/02/17 1054     Ralene Ok, MD PGY2 Clancy Gourd   Sela Hilding, MD 06/02/17 1117    Macarthur Critchley, MD 06/03/17 [phone]

## 2017-06-04 ENCOUNTER — Other Ambulatory Visit: Payer: Self-pay | Admitting: *Deleted

## 2017-06-04 ENCOUNTER — Encounter: Payer: Self-pay | Admitting: *Deleted

## 2017-06-04 NOTE — Patient Outreach (Signed)
[facility]) Care Management  06/04/2017  [name] Long 1940-09-28 [id number]    Telephone Assessment  Referral received 6/3 (medcation)  RN spoke with pt today and introduced the [facility] program and available services. RN inquired on possible needs however pt states all she needs if assistance with one of her medications that "cost to much". States her spouse is also on expensive medication but his is life threatening and she will get his medications initial. This is why she was in the ED due to the not being able to afford this medications. RN inquired on any other needs however pt states no other needs. Offered to make a referral to [facility] to assist further and pt was receptive.  Will refer accordingly for assistance and close this disciplinary out of service with MD notification.  Raina Mina, RN Care Management Coordinator [place] Office [phone]

## 2017-06-05 LAB — URINE CULTURE

## 2017-06-06 ENCOUNTER — Telehealth: Payer: Self-pay | Admitting: Emergency Medicine

## 2017-06-06 NOTE — Telephone Encounter (Signed)
Post ED Visit - Positive Culture Follow-up  Culture report reviewed by antimicrobial stewardship pharmacist:  []  Elenor Quinones, Pharm.D. []  Heide Guile, Pharm.D., BCPS AQ-ID []  Parks Neptune, Pharm.D., BCPS []  Alycia Rossetti, Pharm.D., BCPS []  Lyle, Pharm.D., BCPS, AAHIVP []  Legrand Como, Pharm.D., BCPS, AAHIVP []  Salome Arnt, PharmD, BCPS []  Wynell Balloon, PharmD []  Vincenza Hews, PharmD, BCPS [facility] PharmD  Positive urine culture Treated with cephalexin, organism sensitive to the same and no further patient follow-up is required at this time.  Hazle Nordmann 06/06/2017, 4:29 PM

## 2017-06-07 DIAGNOSIS — R338 Other retention of urine: Secondary | ICD-10-CM | POA: Diagnosis not present

## 2017-06-12 ENCOUNTER — Telehealth: Payer: Self-pay | Admitting: Pharmacist

## 2017-06-12 NOTE — Patient Outreach (Signed)
[facility]) Care Management  [place]   06/14/2017  [name] Long 1940-03-07 [id number]  77 y.o. year old female referred to [facility] for Medication Assistance (Pharmacy telephone outreach)    PMH s/f: CAD s/p CABG x3, HLD, HTN, asthma, GERD, rheumatoid arthritis, osteoarthritis, fibromyalgia, "Bladder cramping" per patient  Patient with [employer] medicare advantage plan.    Patient confirms identity with HIPAA-identifiers x2 and gives verbal consent to speak over the phone about medications.    SUBJECTIVE:   Medication Adherence: Reports she takes advair once daily if she remembers Reports adherence to all other medications  Medication Assistance:  Reports she will not qualify for LIS/Extra help. States her medications are not expensive, however her husband's are and she sometimes goes wihtout hers to pay for his. Reports he is in the donut hole. Requests [facility] for him.   Medication Management:  Asthma - some nighttime awakenings with SOB, using albuterol PRN <1x/month, denies daytime SOB unless walking uphill CAD - doesn't remember ever being on lisinopril though she does have a remote fill hx in 2018, no mention of d/c in notes. On moderate intensity statin therapy. No nitroglycerin prescription noted. Patient denies chest pain RA - sx controlled on plaquenil every-other-day per patinet Vitamin D deficiency - on ergocalciferol 50000 units weekly >1 yr per patient, also on cholecalciferol 1000 units daily >1 year OA- Taking calcium and vitamin D supplements "Bladder cramping" - patient reports she occasionally has this occur and is unable to urinate. This problem has not reoccurred since starting rapaflo.  Falls- denies   OBJECTIVE: Monetary Funds for asthma and RA both closed Last vitamin D level checked 07/09/15 - 29. No f/u levels noted.  Lipid Panel     Component Value Date/Time   CHOL 150 05/17/2017 0806   TRIG 42  05/17/2017 0806   HDL 90 05/17/2017 0806   CHOLHDL 1.7 05/17/2017 0806   CHOLHDL 1.7 11/29/2015 1144   VLDL 12 11/29/2015 1144   LDLCALC 52 05/17/2017 0806     Encounter Medications: Outpatient Encounter Medications as of 06/12/2017  Medication Sig Note  . albuterol (PROVENTIL HFA) 108 (90 Base) MCG/ACT inhaler INHALE 2 PUFF AS DIRECTED FOUR TIMES A DAY AS NEEDED (Patient taking differently: Inhale 2 puffs into the lungs every 6 (six) hours as needed for wheezing or shortness of breath. INHALE 2 PUFF AS DIRECTED FOUR TIMES A DAY AS NEEDED) 06/12/2017: Taking once a month or less  . aspirin EC 81 MG tablet Take 81 mg by mouth daily.   Marland Kitchen atorvastatin (LIPITOR) 20 MG tablet TAKE ONE TABLET BY MOUTH DAILY **MUST CALL MD FOR APPOINTMENT   . butalbital-acetaminophen-caffeine (FIORICET, ESGIC) 50-325-40 MG tablet Take 1 tablet by mouth every 6 (six) hours as needed for headache.    . Calcium Carbonate-Vit D-Min (CALCIUM 1200 PO) Take 1 tablet by mouth daily.   . carisoprodol (SOMA) 350 MG tablet Take 350 mg by mouth 2 (two) times daily as needed for muscle spasms.  06/12/2017: Taking once every 2-3 days  . cholecalciferol (VITAMIN D) 1000 units tablet Take 1,000 Units by mouth daily.   . Fluticasone-Salmeterol (ADVAIR DISKUS) 500-50 MCG/DOSE AEPB Inhale 1 puff into the lungs 2 (two) times daily. 06/12/2017: I forget to use it some, once a day  . gabapentin (NEURONTIN) 600 MG tablet Takes 2 tablets at noon and 3 tablets at bedtime   . HYDROmorphone (DILAUDID) 4 MG tablet Take 4 mg by mouth 3 (three)  times daily as needed for severe pain. For pain  06/12/2017: Taking 3-4 times weekly  . hydroxychloroquine (PLAQUENIL) 200 MG tablet Take 200 mg by mouth every other day.    . magnesium oxide (MAG-OX) 400 MG tablet Take 400 mg by mouth at bedtime.   . metoprolol tartrate (LOPRESSOR) 25 MG tablet TAKE ONE TABLET BY MOUTH TWICE A DAY   . montelukast (SINGULAIR) 10 MG tablet TAKE 1 TABLET BY MOUTH  ONCE A DAY  (Patient taking differently: Take 10 mg by mouth at bedtime. )   . Polyethyl Glycol-Propyl Glycol (SYSTANE OP) Apply 1 drop to eye as needed (DRY EYES).    Marland Kitchen silodosin (RAPAFLO) 8 MG CAPS capsule Take 8 mg by mouth daily with breakfast.   . Vitamin D, Ergocalciferol, (DRISDOL) 50000 units CAPS capsule TAKE 1 CAPSULE BY MOUTH EVERY 7 DAYS   . [DISCONTINUED] cholecalciferol (VITAMIN D) 1000 units tablet Take 1,000 Units by mouth daily.   . [DISCONTINUED] lisinopril (PRINIVIL,ZESTRIL) 20 MG tablet Take 20 mg by mouth daily.   . [DISCONTINUED] aspirin EC 325 MG EC tablet Take 1 tablet (325 mg total) by mouth 2 (two) times daily after a meal.    No facility-administered encounter medications on file as of 06/12/2017.     Functional Status: In your present state of health, do you have any difficulty performing the following activities: 08/16/2016 07/31/2016  Hearing? N N  Vision? N N  Difficulty concentrating or making decisions? N N  Walking or climbing stairs? Y Y  Comment - right hip  Dressing or bathing? N N  Doing errands, shopping? - N  Some recent data might be hidden    Fall/Depression Screening: Fall Risk  07/31/2016  Falls in the past year? No   PHQ 2/9 Scores 07/31/2016  PHQ - 2 Score 0    Assessment:  Drugs sorted by system:  Neurologic/Psychologic:  Cardiovascular:aspirin, atorvastatin, metoprolol tartrate,   Pulmonary/Allergy:albuterol, advair, montelukast  Endocrine:hydroxychloroquine  Renal:silodosin  Pain:gabapentin, fioricet, carisoprodol, hydromorphone  Vitamins/Minerals:ergocalciferol, cholescalciferol, magnesium oxide, calcium+Vitamin D  Duplications in therapy: ergocalciferol, cholecalciferol x2 Gaps in therapy: ACE/ARB therapy, sublingual nitroglycerin therapy Medications to avoid in the elderly: opioids, carisoprodol Other issues noted: adherence to advair  Plan: #Medication management - patient with h/o vitamin D deficiency and no f/u labs on  duplicate therapy, h/o CAD s/p CABG with no ACE/ARB or nitroglycerin therapy, multiple sedating medications in elderly patient however she appears to be using infrequently. -Recommend d/c ergocalciferol and take vitamin D level -Recommend starting ACE or ARB in this patient and prescribing sublingual nitroglycerin -Recommend careful monitoring of patient's pain medication use  #Medication adherence - noncompliant with Advair BID dosing -Counseled patient on adherence and technique. Patient verbalized understanding  #Medication assistance - patient denies need for herself. No Disease Funds open at this time, patient will not qualify for LIS/Extra help or patient assistance programs based on her income. Need expressed for her husband.  -Provided patient telephone number for [facility] referral line for her husband.   Will route this note and barriers letter to Dr. Redmond School (primary) and Dr. Gwenlyn Found (cardiollgy) along with welcome letter to patient.   Carlean Jews, Pharm.D., BCPS PGY2 Ambulatory Care Pharmacy Resident Phone: [phone]

## 2017-06-14 NOTE — Telephone Encounter (Signed)
Have her schedule an appointment so we can discuss the recommendations

## 2017-06-22 ENCOUNTER — Telehealth: Payer: Self-pay | Admitting: Pharmacist

## 2017-06-22 NOTE — Patient Outreach (Signed)
[facility]) Care Management  06/22/2017  [name] Long [birth year] [id number]   Called patient to follow up on our phone conversation last week. Of note, patient ended call early as she just walked into her friend's home and it was not a great time to talk. Encouraged patient to make f/u appointment with Dr. Levin Bacon ASAP to discuss medication changes. Provided her with toll free number for [facility] to refer her husband for medication assistance again. Patient declines further pharmacy need. Will close case.   Carlean Jews, Pharm.D., BCPS PGY2 Ambulatory Care Pharmacy Resident Phone: [phone]

## 2017-06-29 DIAGNOSIS — R3914 Feeling of incomplete bladder emptying: Secondary | ICD-10-CM | POA: Diagnosis not present

## 2017-06-30 ENCOUNTER — Other Ambulatory Visit: Payer: Self-pay | Admitting: Cardiovascular Disease

## 2017-07-08 ENCOUNTER — Other Ambulatory Visit: Payer: Self-pay

## 2017-07-08 ENCOUNTER — Emergency Department (HOSPITAL_BASED_OUTPATIENT_CLINIC_OR_DEPARTMENT_OTHER)
Admission: EM | Admit: 2017-07-08 | Discharge: 2017-07-08 | Disposition: A | Discharge status: Home / Self Care | Payer: PPO | Attending: Emergency Medicine | Admitting: Emergency Medicine

## 2017-07-08 ENCOUNTER — Encounter (HOSPITAL_BASED_OUTPATIENT_CLINIC_OR_DEPARTMENT_OTHER): Payer: Self-pay | Admitting: *Deleted

## 2017-07-08 DIAGNOSIS — Z79899 Other long term (current) drug therapy: Secondary | ICD-10-CM | POA: Diagnosis not present

## 2017-07-08 DIAGNOSIS — I2510 Atherosclerotic heart disease of native coronary artery without angina pectoris: Secondary | ICD-10-CM | POA: Insufficient documentation

## 2017-07-08 DIAGNOSIS — R339 Retention of urine, unspecified: Secondary | ICD-10-CM | POA: Insufficient documentation

## 2017-07-08 DIAGNOSIS — I10 Essential (primary) hypertension: Secondary | ICD-10-CM | POA: Insufficient documentation

## 2017-07-08 DIAGNOSIS — I252 Old myocardial infarction: Secondary | ICD-10-CM | POA: Insufficient documentation

## 2017-07-08 DIAGNOSIS — Z96641 Presence of right artificial hip joint: Secondary | ICD-10-CM | POA: Insufficient documentation

## 2017-07-08 DIAGNOSIS — Z951 Presence of aortocoronary bypass graft: Secondary | ICD-10-CM | POA: Diagnosis not present

## 2017-07-08 DIAGNOSIS — J45909 Unspecified asthma, uncomplicated: Secondary | ICD-10-CM | POA: Diagnosis not present

## 2017-07-08 DIAGNOSIS — Z7982 Long term (current) use of aspirin: Secondary | ICD-10-CM | POA: Diagnosis not present

## 2017-07-08 DIAGNOSIS — Z87891 Personal history of nicotine dependence: Secondary | ICD-10-CM | POA: Insufficient documentation

## 2017-07-08 LAB — CBC WITH DIFFERENTIAL/PLATELET
BASOS ABS: 0 10*3/uL (ref 0.0–0.1)
BASOS PCT: 1 %
EOS ABS: 0.2 10*3/uL (ref 0.0–0.7)
EOS PCT: 3 %
HCT: 40.4 % (ref 36.0–46.0)
Hemoglobin: 13.6 g/dL (ref 12.0–15.0)
Lymphocytes Relative: 23 %
Lymphs Abs: 1.3 10*3/uL (ref 0.7–4.0)
MCH: 29.6 pg (ref 26.0–34.0)
MCHC: 33.7 g/dL (ref 30.0–36.0)
MCV: 87.8 fL (ref 78.0–100.0)
MONO ABS: 0.4 10*3/uL (ref 0.1–1.0)
Monocytes Relative: 6 %
NEUTROS ABS: 3.9 10*3/uL (ref 1.7–7.7)
Neutrophils Relative %: 67 %
PLATELETS: 175 10*3/uL (ref 150–400)
RBC: 4.6 MIL/uL (ref 3.87–5.11)
RDW: 12.9 % (ref 11.5–15.5)
WBC: 5.8 10*3/uL (ref 4.0–10.5)

## 2017-07-08 LAB — URINALYSIS, ROUTINE W REFLEX MICROSCOPIC
BILIRUBIN URINE: NEGATIVE
Glucose, UA: NEGATIVE mg/dL
Hgb urine dipstick: NEGATIVE
Ketones, ur: NEGATIVE mg/dL
LEUKOCYTES UA: NEGATIVE
NITRITE: NEGATIVE
PROTEIN: NEGATIVE mg/dL
Specific Gravity, Urine: <1.005 — ABNORMAL LOW (ref 1.005–1.030)
pH: 6.5 (ref 5.0–8.0)

## 2017-07-08 LAB — BASIC METABOLIC PANEL
ANION GAP: 8 (ref 5–15)
BUN: 20 mg/dL (ref 8–23)
CALCIUM: 9 mg/dL (ref 8.9–10.3)
CO2: 26 mmol/L (ref 22–32)
Chloride: 107 mmol/L (ref 98–111)
Creatinine, Ser: 1.07 mg/dL — ABNORMAL HIGH (ref 0.44–1.00)
GFR, EST AFRICAN AMERICAN: 57 mL/min — AB (ref >60)
GFR, EST NON AFRICAN AMERICAN: 49 mL/min — AB (ref >60)
Glucose, Bld: 121 mg/dL — ABNORMAL HIGH (ref 70–99)
Potassium: 4 mmol/L (ref 3.5–5.1)
SODIUM: 141 mmol/L (ref 135–145)

## 2017-07-08 NOTE — ED Notes (Signed)
ED Provider at bedside. 

## 2017-07-08 NOTE — ED Notes (Signed)
Pt triaged by this RN 

## 2017-07-08 NOTE — ED Notes (Signed)
577 ml  noted on bladder scan

## 2017-07-08 NOTE — ED Notes (Signed)
ED Provider at bedside. Catheter unclamped.

## 2017-07-08 NOTE — ED Provider Notes (Signed)
[place] EMERGENCY DEPARTMENT Provider Note   CSN: [id number] Arrival date & time: 07/08/17  1017     History   Chief Complaint Chief Complaint  Patient presents with  . Urinary Retention    HPI DEAISA MERIDA is a 77 y.o. female hx of CAD, fibromyalgia, previous urinary retention, here presenting with urinary retention.  Patient states that since 1:30 AM she was unable to urinate.  She states that about a month ago she had similar symptoms and came to the ED and was found to be in retention.  Foley was placed at that time and she was put on antibiotics.  She has [facility] with [facility]. The foley was removed and she is on flomax. Denies any back pain, leg pain or weakness. Denies any fever or chills.   The history is provided by the patient.    Past Medical History:  Diagnosis Date  . Abnormal EKG 01/12/2011  . Allergy    RHINITIS  . Arthritis   . Asthma   . Asthma 01/12/2011  . Back pain, chronic--plans were for cervical disc surgery week of 01/16/11 01/12/2011  . Blood clot in vein 2004   L leg  . Blood transfusion without reported diagnosis 2004  . Chronic low back pain   . Complication of anesthesia   . Connective tissue disease ([place])   . Coronary artery disease   . Crescendo angina, with EKG changes, new. 01/12/2011  . Dyspnea   . Fibromyalgia   . Fibromyalgia   . Headache    migraine - several in a yr.   . History of stress test 04/2011   Normal stress test  . Hx of echocardiogram 07/19/2011   EF 50-55%. There is mild annular calcification, there is trace migtral regurgitation, there is trace tricupid regurgitation, pericardium is thick, there is no pericardial effusion.  . Hypertension   . Myocardial infarction [facility]) 2013   triple heart bypass  . Neuromuscular disorder (HCC)    fibromyalgia   . Osteoporosis    OSTEOPENIA  . Personal history of colonic polyp - adenoma 08/05/2013   08/2013 - 7 mm adenoma - consider repeat colonoscopy 2020  (age 45)  . Pneumonia    numerous times, only admitted one time for pneumonia but she has had pneumonia numerous times  . PONV (postoperative nausea and vomiting)   . PPD positive   . Status post coronary artery bypass grafting   . Urinary incontinence    post op, "sleepy bladder", urinar retention     Patient Active Problem List   Diagnosis Date Noted  . Essential hypertension 05/15/2017  . Primary localized osteoarthritis of right hip 08/22/2016  . Primary osteoarthritis of right hip 08/22/2016  . Arthritis of right hip 05/01/2016  . Lumbar radiculopathy, right 03/06/2016  . Personal history of colonic polyp - adenoma 08/05/2013  . GERD (gastroesophageal reflux disease) 03/11/2013  . Rheumatoid arthritis ([place]) 03/11/2013  . High frequency hearing loss 03/11/2013  . Tinnitus 03/11/2013  . Hyperlipidemia 08/21/2012  . S/P CABG x 3: (lima-lad,svg-om,svg-rca) 01/16/2011  . CAD (coronary artery disease): multivessel, good LVF 50% at cath 01/16/2011  . Back pain, chronic--plans were for cervical disc surgery week of 01/16/11 01/12/2011  . Asthma, chronic 01/12/2011  . Fibromyalgia 01/12/2011    Past Surgical History:  Procedure Laterality Date  . ABDOMINAL HYSTERECTOMY  1981  . APPENDECTOMY  1946  . BLADDER REPAIR  1985  . BREAST SURGERY  1992   REDUCTION,  x3 surgeries overall on breast for augmentation   . CARDIAC CATHETERIZATION  2013  . CARPAL TUNNEL RELEASE Bilateral 1988, 1999  . [place]  . CORONARY ARTERY BYPASS GRAFT  01/14/2011   Procedure: CORONARY ARTERY BYPASS GRAFTING (CABG);  Surgeon: Tharon Aquas Adelene Idler, MD;  Location: [place];  Service: Open Heart Surgery;  Laterality: N/A;  CABG x three, using right greater saphenous vein harvested endoscopically  . EYE SURGERY Bilateral 1985   radiokerototomy  . LEFT HEART CATHETERIZATION WITH CORONARY ANGIOGRAM N/A 01/13/2011   Procedure: LEFT HEART CATHETERIZATION WITH CORONARY ANGIOGRAM;  Surgeon:  Troy Sine, MD;  Location: [facility] CATH LAB;  Service: Cardiovascular;  Laterality: N/A;  . PUBOVAGINAL SLING  2002  . STERNAL WOUND DEBRIDEMENT  07/27/2011   Procedure: STERNAL WOUND DEBRIDEMENT;  followed by wound vac Surgeon: Ivin Poot, MD;  Location: [place];  Service: Open Heart Surgery;  Laterality: N/A;  . TONSILLECTOMY  1952  . TOTAL HIP ARTHROPLASTY Right 08/22/2016   Procedure: TOTAL HIP ARTHROPLASTY ANTERIOR APPROACH;  Surgeon: Melrose Nakayama, MD;  Location: [place];  Service: Orthopedics;  Laterality: Right;  . TRIGGER FINGER RELEASE Right 2014   4th finger  . TROCHANTERIC BURSA EXCISION Right    bursa removed, R hip  . TUBAL LIGATION       OB History   None      Home Medications    Prior to Admission medications   Medication Sig Start Date End Date Taking? Authorizing Provider  albuterol (PROVENTIL HFA) 108 (90 Base) MCG/ACT inhaler INHALE 2 PUFF AS DIRECTED FOUR TIMES A DAY AS NEEDED Patient taking differently: Inhale 2 puffs into the lungs every 6 (six) hours as needed for wheezing or shortness of breath. INHALE 2 PUFF AS DIRECTED FOUR TIMES A DAY AS NEEDED 07/09/15   Denita Lung, MD  aspirin EC 81 MG tablet Take 81 mg by mouth daily.    [provider]  atorvastatin (LIPITOR) 20 MG tablet TAKE ONE TABLET BY MOUTH DAILY **MUST CALL MD FOR APPOINTMENT 03/06/17   Denita Lung, MD  butalbital-acetaminophen-caffeine (FIORICET, ESGIC) [phone] MG tablet Take 1 tablet by mouth every 6 (six) hours as needed for headache.     [provider]  Calcium Carbonate-Vit D-Min (CALCIUM 1200 PO) Take 1 tablet by mouth daily.    [provider]  carisoprodol (SOMA) 350 MG tablet Take 350 mg by mouth 2 (two) times daily as needed for muscle spasms.     [provider]  cholecalciferol (VITAMIN D) 1000 units tablet Take 1,000 Units by mouth daily.    [provider]  Fluticasone-Salmeterol (ADVAIR DISKUS) 500-50 MCG/DOSE AEPB Inhale 1 puff  into the lungs 2 (two) times daily. 07/31/16   Denita Lung, MD  gabapentin (NEURONTIN) 600 MG tablet Takes 2 tablets at noon and 3 tablets at bedtime    [provider]  HYDROmorphone (DILAUDID) 4 MG tablet Take 4 mg by mouth 3 (three) times daily as needed for severe pain. For pain     [provider]  hydroxychloroquine (PLAQUENIL) 200 MG tablet Take 200 mg by mouth every other day.     [provider]  magnesium oxide (MAG-OX) 400 MG tablet Take 400 mg by mouth at bedtime.    [provider]  metoprolol tartrate (LOPRESSOR) 25 MG tablet TAKE ONE TABLET BY MOUTH TWICE A DAY 07/02/17   Lorretta Harp, MD  montelukast (SINGULAIR) 10 MG  tablet TAKE 1 TABLET BY MOUTH  ONCE A DAY Patient taking differently: Take 10 mg by mouth at bedtime.  07/31/16   Denita Lung, MD  Polyethyl Glycol-Propyl Glycol (SYSTANE OP) Apply 1 drop to eye as needed (DRY EYES).     [provider]  silodosin (RAPAFLO) 8 MG CAPS capsule Take 8 mg by mouth daily with breakfast.    [provider]  Vitamin D, Ergocalciferol, (DRISDOL) 50000 units CAPS capsule TAKE 1 CAPSULE BY MOUTH EVERY 7 DAYS 02/09/17   Lyndal Pulley, DO    Family History Family History  Adopted: Yes  Problem Relation Age of Onset  . Hypertension Brother     Social History Social History   Tobacco Use  . Smoking status: Former Smoker    Last attempt to quit: 01/12/1980    Years since quitting: 37.5  . Smokeless tobacco: Never Used  Substance Use Topics  . Alcohol use: No    Alcohol/week: 0.0 oz  . Drug use: No     Allergies   Morphine and related; Percocet [oxycodone-acetaminophen]; and Zolpidem   Review of Systems Review of Systems  Genitourinary: Positive for difficulty urinating.  All other systems reviewed and are negative.    Physical Exam Updated Vital Signs BP 109/62 (BP Location: Right Arm)   Pulse 66   Temp 98.4 F (36.9 C) (Oral)   Resp 20   Ht 5\' 4"  (1.626  m)   Wt 81.2 kg (179 lb)   SpO2 98%   BMI 30.73 kg/m   Physical Exam  Constitutional: She is oriented to person, place, and time.  Uncomfortable   HENT:  Head: Normocephalic.  Mouth/Throat: Oropharynx is clear and moist.  Eyes: Pupils are equal, round, and reactive to light. Conjunctivae and EOM are normal.  Neck: Normal range of motion. Neck supple.  Cardiovascular: Normal rate, regular rhythm and normal heart sounds.  Pulmonary/Chest: Effort normal and breath sounds normal.  Abdominal:  + bladder distention and tenderness, no CVAT.   Musculoskeletal:  No spinal tenderness. No saddle anesthesia   Neurological: She is alert and oriented to person, place, and time. No cranial nerve deficit. Coordination normal.  Neurovascular intact bilateral lower extremities   Skin: Skin is warm. Capillary refill takes less than 2 seconds.  Psychiatric: She has a normal mood and affect.  Nursing note and vitals reviewed.    ED Treatments / Results  Labs (all labs ordered are listed, but only abnormal results are displayed) Labs Reviewed  BASIC METABOLIC PANEL - Abnormal; Notable for the following components:      Result Value   Glucose, Bld 121 (*)    Creatinine, Ser 1.07 (*)    GFR calc non Af Amer 49 (*)    GFR calc Af Amer 57 (*)    All other components within normal limits  URINALYSIS, ROUTINE W REFLEX MICROSCOPIC - Abnormal; Notable for the following components:   Specific Gravity, Urine <1.005 (*)    All other components within normal limits  URINE CULTURE  CBC WITH DIFFERENTIAL/PLATELET    EKG None  Radiology No results found.  Procedures Procedures (including critical care time)  Medications Ordered in ED Medications - No data to display   Initial Impression / Assessment and Plan / ED Course  I have reviewed the triage vital signs and the nursing notes.  Pertinent labs & imaging results that were available during my care of the patient were reviewed by me and  considered in my medical  decision making (see chart for details).     CARRELL PALMATIER is a 77 y.o. female here with urinary retention. This is recurrent problem. Patient has > 500 cc on bladder scan. Patient has seen urologist for this. Denies back pain or leg pain or weakness and has no signs of cauda equina. Will get chemistry, place foley.   6:37 AM Nurse placed 14 F foley. About 1500 cc came out. UA normal. Cr normal. Will leave foley in and have her follow up with urology.    Final Clinical Impressions(s) / ED Diagnoses   Final diagnoses:  None    ED Discharge Orders    None       Drenda Freeze, MD 07/08/17 [phone]

## 2017-07-08 NOTE — Discharge Instructions (Addendum)
Continue your current meds.   Follow up with urologist for foley removal   Return to ER if you the foley is not draining, blood in catheter, fever

## 2017-07-08 NOTE — ED Notes (Signed)
Pt given d/c instructions as per chart. Verbalizes understanding. No questions. 

## 2017-07-08 NOTE — ED Triage Notes (Signed)
Pt states she was seen here 2 months ago for urinary retention. Has been following up with urologist and taking meds as instructed. Unable to void onset 0130. Denies other s/s.

## 2017-07-08 NOTE — ED Notes (Signed)
1500 ml total emptied from foley. Pt states she feels much better.

## 2017-07-09 DIAGNOSIS — N312 Flaccid neuropathic bladder, not elsewhere classified: Secondary | ICD-10-CM | POA: Diagnosis not present

## 2017-07-09 DIAGNOSIS — R3914 Feeling of incomplete bladder emptying: Secondary | ICD-10-CM | POA: Diagnosis not present

## 2017-07-09 DIAGNOSIS — R338 Other retention of urine: Secondary | ICD-10-CM | POA: Diagnosis not present

## 2017-07-09 LAB — URINE CULTURE: Culture: NO GROWTH

## 2017-07-17 DIAGNOSIS — N3941 Urge incontinence: Secondary | ICD-10-CM | POA: Diagnosis not present

## 2017-07-17 DIAGNOSIS — R3914 Feeling of incomplete bladder emptying: Secondary | ICD-10-CM | POA: Diagnosis not present

## 2017-07-23 ENCOUNTER — Ambulatory Visit (INDEPENDENT_AMBULATORY_CARE_PROVIDER_SITE_OTHER): Payer: PPO | Admitting: Family Medicine

## 2017-07-23 VITALS — BP 118/80 | HR 77 | Temp 98.0°F | Wt 178.8 lb

## 2017-07-23 DIAGNOSIS — K2100 Gastro-esophageal reflux disease with esophagitis, without bleeding: Secondary | ICD-10-CM

## 2017-07-23 DIAGNOSIS — M8580 Other specified disorders of bone density and structure, unspecified site: Secondary | ICD-10-CM

## 2017-07-23 DIAGNOSIS — E559 Vitamin D deficiency, unspecified: Secondary | ICD-10-CM | POA: Diagnosis not present

## 2017-07-23 NOTE — Patient Instructions (Signed)
Is taking 40 mg of Nexium nightly for the next week or two.  Let me know how it works

## 2017-07-23 NOTE — Progress Notes (Signed)
   Subjective:    Patient ID: [name] Long, female    DOB: November 11, 1940, 77 y.o.   MRN: [id number]  HPI She is here for consult.  She has been on vitamin D 50,000 units weekly for the last year and a half but has not had a follow-up vitamin D level since November 2017.  She has no complaints but does have a DEXA scan done in the past which did show low bone mass. She also complains of a several day history of difficulty with esophageal burning and pain that started on Saturday.  She then started taking Nexium 20 mg.  She is also been using Pepto-Bismol to help with her discomfort.  Review of Systems     Objective:   Physical Exam Alert and in no distress otherwise not examined      Assessment & Plan:  Vitamin D deficiency - Plan: DG Bone Density, VITAMIN D 25 Hydroxy (Vit-D Deficiency, Fractures)  Osteopenia, unspecified location - Plan: DG Bone Density  Gastroesophageal reflux disease with esophagitis I will check a vitamin D level and follow-up with a DEXA scan Recommend she increase her Nexium to 40 mg nightly for the next week and then call me.  Recommend she use Maalox or Mylanta liquid during the day.

## 2017-07-24 LAB — VITAMIN D 25 HYDROXY (VIT D DEFICIENCY, FRACTURES): Vit D, 25-Hydroxy: 60.9 ng/mL (ref 30.0–100.0)

## 2017-08-01 DIAGNOSIS — R338 Other retention of urine: Secondary | ICD-10-CM | POA: Diagnosis not present

## 2017-08-06 DIAGNOSIS — M150 Primary generalized (osteo)arthritis: Secondary | ICD-10-CM | POA: Diagnosis not present

## 2017-08-06 DIAGNOSIS — G894 Chronic pain syndrome: Secondary | ICD-10-CM | POA: Diagnosis not present

## 2017-08-06 DIAGNOSIS — Z79891 Long term (current) use of opiate analgesic: Secondary | ICD-10-CM | POA: Diagnosis not present

## 2017-08-06 DIAGNOSIS — M25551 Pain in right hip: Secondary | ICD-10-CM | POA: Diagnosis not present

## 2017-08-13 DIAGNOSIS — R338 Other retention of urine: Secondary | ICD-10-CM | POA: Diagnosis not present

## 2017-08-13 DIAGNOSIS — M6289 Other specified disorders of muscle: Secondary | ICD-10-CM | POA: Diagnosis not present

## 2017-08-13 DIAGNOSIS — M6281 Muscle weakness (generalized): Secondary | ICD-10-CM | POA: Diagnosis not present

## 2017-08-13 DIAGNOSIS — N3941 Urge incontinence: Secondary | ICD-10-CM | POA: Diagnosis not present

## 2017-08-13 DIAGNOSIS — M62838 Other muscle spasm: Secondary | ICD-10-CM | POA: Diagnosis not present

## 2017-08-13 DIAGNOSIS — N3944 Nocturnal enuresis: Secondary | ICD-10-CM | POA: Diagnosis not present

## 2017-08-14 ENCOUNTER — Telehealth: Payer: Self-pay | Admitting: Internal Medicine

## 2017-08-14 NOTE — Telephone Encounter (Signed)
Pt called and states that breast center called her and said she has to go back to [facility] for her bone density. Please refer pt back to [facility]

## 2017-08-14 NOTE — Telephone Encounter (Signed)
Faxed over 08-14-17 kh

## 2017-08-16 DIAGNOSIS — M1611 Unilateral primary osteoarthritis, right hip: Secondary | ICD-10-CM | POA: Diagnosis not present

## 2017-08-18 DIAGNOSIS — M8589 Other specified disorders of bone density and structure, multiple sites: Secondary | ICD-10-CM | POA: Diagnosis not present

## 2017-08-18 LAB — HM DEXA SCAN

## 2017-08-21 ENCOUNTER — Encounter: Payer: Self-pay | Admitting: Family Medicine

## 2017-08-21 NOTE — Progress Notes (Signed)
A 

## 2017-08-24 ENCOUNTER — Encounter: Payer: Self-pay | Admitting: Family Medicine

## 2017-08-24 ENCOUNTER — Telehealth: Payer: Self-pay

## 2017-08-24 NOTE — Telephone Encounter (Signed)
Pt advised. KH 

## 2017-08-24 NOTE — Telephone Encounter (Signed)
yes

## 2017-08-24 NOTE — Telephone Encounter (Signed)
Spoke to pt about continuing her calcium and vit d . Pt asked should she continue on her combo vit d and calcium and Vit d supplement . Please advise [facility]

## 2017-08-27 DIAGNOSIS — M6289 Other specified disorders of muscle: Secondary | ICD-10-CM | POA: Diagnosis not present

## 2017-08-27 DIAGNOSIS — M62838 Other muscle spasm: Secondary | ICD-10-CM | POA: Diagnosis not present

## 2017-08-27 DIAGNOSIS — R3914 Feeling of incomplete bladder emptying: Secondary | ICD-10-CM | POA: Diagnosis not present

## 2017-08-27 DIAGNOSIS — M6281 Muscle weakness (generalized): Secondary | ICD-10-CM | POA: Diagnosis not present

## 2017-08-27 DIAGNOSIS — N3944 Nocturnal enuresis: Secondary | ICD-10-CM | POA: Diagnosis not present

## 2017-08-27 DIAGNOSIS — N3941 Urge incontinence: Secondary | ICD-10-CM | POA: Diagnosis not present

## 2017-08-27 DIAGNOSIS — R338 Other retention of urine: Secondary | ICD-10-CM | POA: Diagnosis not present

## 2017-09-10 ENCOUNTER — Other Ambulatory Visit: Payer: Self-pay | Admitting: Family Medicine

## 2017-09-10 DIAGNOSIS — J4530 Mild persistent asthma, uncomplicated: Secondary | ICD-10-CM

## 2017-09-12 ENCOUNTER — Other Ambulatory Visit: Payer: Self-pay | Admitting: Family Medicine

## 2017-09-12 DIAGNOSIS — J4530 Mild persistent asthma, uncomplicated: Secondary | ICD-10-CM

## 2017-09-12 DIAGNOSIS — E785 Hyperlipidemia, unspecified: Secondary | ICD-10-CM

## 2017-09-13 ENCOUNTER — Encounter: Payer: Self-pay | Admitting: Family Medicine

## 2017-09-21 DIAGNOSIS — H524 Presbyopia: Secondary | ICD-10-CM | POA: Diagnosis not present

## 2017-10-01 ENCOUNTER — Ambulatory Visit (INDEPENDENT_AMBULATORY_CARE_PROVIDER_SITE_OTHER): Payer: PPO | Admitting: Family Medicine

## 2017-10-01 ENCOUNTER — Encounter: Payer: Self-pay | Admitting: Family Medicine

## 2017-10-01 ENCOUNTER — Ambulatory Visit
Admission: RE | Admit: 2017-10-01 | Discharge: 2017-10-01 | Disposition: A | Discharge status: Home / Self Care | Payer: PPO | Source: Ambulatory Visit | Attending: Family Medicine | Admitting: Family Medicine

## 2017-10-01 VITALS — BP 128/82 | HR 70 | Temp 98.3°F | Wt 178.0 lb

## 2017-10-01 DIAGNOSIS — S3991XA Unspecified injury of abdomen, initial encounter: Secondary | ICD-10-CM

## 2017-10-01 DIAGNOSIS — L309 Dermatitis, unspecified: Secondary | ICD-10-CM

## 2017-10-01 DIAGNOSIS — R1011 Right upper quadrant pain: Secondary | ICD-10-CM | POA: Diagnosis not present

## 2017-10-01 DIAGNOSIS — Z23 Encounter for immunization: Secondary | ICD-10-CM | POA: Diagnosis not present

## 2017-10-01 LAB — CBC WITH DIFFERENTIAL/PLATELET
BASOS ABS: 0 10*3/uL (ref 0.0–0.2)
Basos: 1 %
EOS (ABSOLUTE): 0.2 10*3/uL (ref 0.0–0.4)
Eos: 3 %
Hematocrit: 38.2 % (ref 34.0–46.6)
Hemoglobin: 12.6 g/dL (ref 11.1–15.9)
LYMPHS ABS: 1.7 10*3/uL (ref 0.7–3.1)
Lymphs: 29 %
MCH: 29.3 pg (ref 26.6–33.0)
MCHC: 33 g/dL (ref 31.5–35.7)
MCV: 89 fL (ref 79–97)
Monocytes Absolute: 0.5 10*3/uL (ref 0.1–0.9)
Monocytes: 8 %
NEUTROS ABS: 3.5 10*3/uL (ref 1.4–7.0)
Neutrophils: 59 %
PLATELETS: 165 10*3/uL (ref 150–450)
RBC: 4.3 x10E6/uL (ref 3.77–5.28)
RDW: 13.7 % (ref 12.3–15.4)
WBC: 5.8 10*3/uL (ref 3.4–10.8)

## 2017-10-01 LAB — POCT URINALYSIS DIP (PROADVANTAGE DEVICE)
BILIRUBIN UA: NEGATIVE
BILIRUBIN UA: NEGATIVE mg/dL
Glucose, UA: NEGATIVE mg/dL
Leukocytes, UA: NEGATIVE
Nitrite, UA: NEGATIVE
PH UA: 6 (ref 5.0–8.0)
Protein Ur, POC: NEGATIVE mg/dL
RBC UA: NEGATIVE
SPECIFIC GRAVITY, URINE: 1.01
Urobilinogen, Ur: 3.5

## 2017-10-01 NOTE — Progress Notes (Signed)
   Subjective:    Patient ID: [name] Long, female    DOB: 1940/07/08, 77 y.o.   MRN: [id number]  HPI She is here for evaluation of a rash present on her face for the last week or 2.  No new soaps, detergents, other preparations for her face.  No other area of her body is noted to have this. 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  She now complains of increasing pain but no nausea, vomiting, diarrhea or trouble breathing.   Review of Systems     Objective:   Physical Exam Alert and in no distress.  Exam of her face does show erythematous splotchy lesions.  Exam of the skin other than the abdomen was normal.  Abdominal exam does show a 4 cm right mid quadrant area of ecchymosis with exquisite tenderness in that area and superior to that but no true rebound.  Bowel sounds were diminished. Urine dipstick was negative.      Assessment & Plan:  Blunt trauma to abdomen, initial encounter - Plan: CT Abdomen Pelvis Wo Contrast, CBC with Differential/Platelet, Urinalysis Dipstick  Need for influenza vaccination - Plan: Flu vaccine HIGH DOSE PF (Fluzone High dose)  Dermatitis - Plan: Ambulatory referral to Dermatology The CBC is pending.  She will keep me informed as to her pain if this gets worse, we will do further evaluation.

## 2017-10-03 ENCOUNTER — Other Ambulatory Visit: Payer: Self-pay

## 2017-10-03 ENCOUNTER — Telehealth: Payer: Self-pay | Admitting: Family Medicine

## 2017-10-03 ENCOUNTER — Ambulatory Visit
Admission: RE | Admit: 2017-10-03 | Discharge: 2017-10-03 | Disposition: A | Discharge status: Home / Self Care | Payer: PPO | Source: Ambulatory Visit | Attending: Family Medicine | Admitting: Family Medicine

## 2017-10-03 ENCOUNTER — Telehealth: Payer: Self-pay

## 2017-10-03 DIAGNOSIS — R109 Unspecified abdominal pain: Secondary | ICD-10-CM

## 2017-10-03 DIAGNOSIS — S3991XA Unspecified injury of abdomen, initial encounter: Secondary | ICD-10-CM

## 2017-10-03 DIAGNOSIS — D4101 Neoplasm of uncertain behavior of right kidney: Secondary | ICD-10-CM | POA: Diagnosis not present

## 2017-10-03 MED ORDER — IOPAMIDOL (ISOVUE-300) INJECTION 61%
100.0000 mL | Freq: Once | INTRAVENOUS | Status: AC | PRN
  Administered 2017-10-03: 100 mL via INTRAVENOUS

## 2017-10-03 NOTE — Telephone Encounter (Signed)
Called pt back to advise to go to [facility] to have ct scan with contrast. Pt say she is going right over . [name] Long

## 2017-10-03 NOTE — Telephone Encounter (Signed)
Per Dr. Redmond School need CT scan with contrast for blunt trauma, wants call to Radiologist regarding oral or IV contrast and wants this done today, referred to [facility] to handle

## 2017-10-03 NOTE — Telephone Encounter (Signed)
Pt states she is getting worse, had terrible night with pain.  Can't lie down, kept her up all night.  Has been taking Dilaudid for pain & hasn't been helping.

## 2017-10-03 NOTE — Telephone Encounter (Signed)
Pt was advised to of stat Ct scan repeat and will have it with contrast. Oral and IV. Per [facility].[place] 10-03-17

## 2017-10-04 ENCOUNTER — Ambulatory Visit
Admission: RE | Admit: 2017-10-04 | Discharge: 2017-10-04 | Disposition: A | Discharge status: Home / Self Care | Payer: PPO | Source: Ambulatory Visit | Attending: Family Medicine | Admitting: Family Medicine

## 2017-10-04 ENCOUNTER — Other Ambulatory Visit: Payer: Self-pay | Admitting: Family Medicine

## 2017-10-04 ENCOUNTER — Other Ambulatory Visit: Payer: Self-pay | Admitting: Urology

## 2017-10-04 DIAGNOSIS — D4101 Neoplasm of uncertain behavior of right kidney: Secondary | ICD-10-CM

## 2017-10-04 DIAGNOSIS — Z01818 Encounter for other preprocedural examination: Secondary | ICD-10-CM | POA: Diagnosis not present

## 2017-10-04 DIAGNOSIS — L249 Irritant contact dermatitis, unspecified cause: Secondary | ICD-10-CM | POA: Diagnosis not present

## 2017-10-09 ENCOUNTER — Telehealth: Payer: Self-pay | Admitting: *Deleted

## 2017-10-09 NOTE — Telephone Encounter (Signed)
   [facility] Pre-operative Risk Assessment    Request for surgical clearance:  1. What type of surgery is being performed? R robotic partial nephrectomy   2. When is this surgery scheduled? TBD   3. What type of clearance is required (medical clearance vs. Pharmacy clearance to hold med vs. Both)? both  4. Are there any medications that need to be held prior to surgery and how long? ASA for 5 days prior   5. Practice name and name of physician performing surgery? [facility] Dr. Ellison Hughs   6. What is your office phone number [phone]    7.   What is your office fax number [phone]  8.   Anesthesia type (None, local, MAC, general) ?    Kess Mcilwain A Evelina Lore 10/09/2017, 4:04 PM  _________________________________________________________________   (provider comments below)

## 2017-10-12 NOTE — Telephone Encounter (Signed)
Dr. Gwenlyn Found can pt be off asprin for 5 days for procedure?  Please let me know.

## 2017-10-12 NOTE — Telephone Encounter (Signed)
Okay to hold aspirin. 

## 2017-10-12 NOTE — Telephone Encounter (Signed)
   Primary Cardiologist: Quay Burow, MD  Chart reviewed as part of pre-operative protocol coverage. Patient was contacted 10/12/2017 in reference to pre-operative risk assessment for pending surgery as outlined below.  [name] Long was last seen on 05/15/17  by Dr. Adora Fridge.  Since that day, [name] Long has done well no chest pain and meets 4 METS.  Will check with Dr. Gwenlyn Found about holding the asprin.  Therefore, based on ACC/AHA guidelines, the patient would be at acceptable risk for the planned procedure without further cardiovascular testing.   I will route this recommendation to the requesting party via Epic fax function and remove from pre-op pool.  Please call with questions.  Cecilie Kicks, NP 10/12/2017, 1:27 PM

## 2017-10-16 NOTE — Telephone Encounter (Signed)
Left message for [facility] Scheduler to let our office know if clearance letter has not yet been recv'd, to call [phone]. I will remove from the call back pool.

## 2017-10-16 NOTE — Telephone Encounter (Signed)
   Primary Cardiologist: Quay Burow, MD  Chart reviewed as part of pre-operative protocol coverage.   Please see recommendations below.  Patient may proceed with surgery.  Patient may hold ASA for 5 days prior to procedure and resume as soon as it is safe post op.  Please call with questions.  Richardson Dopp, PA-C 10/16/2017, 3:18 PM

## 2017-10-16 NOTE — Telephone Encounter (Signed)
Call back staff -  This note was sent to the requesting surgeon. Please make sure it was received.  Notify patient she can hold ASA for 5 days prior to surgery.  The surgeon will tell her when she can resume it. This note will be removed from the preop pool. Richardson Dopp, PA-C    10/16/2017 3:23 PM

## 2017-10-18 ENCOUNTER — Other Ambulatory Visit: Payer: Self-pay | Admitting: Urology

## 2017-11-02 ENCOUNTER — Other Ambulatory Visit: Payer: Self-pay | Admitting: Family Medicine

## 2017-11-02 DIAGNOSIS — J4530 Mild persistent asthma, uncomplicated: Secondary | ICD-10-CM

## 2017-11-02 NOTE — Telephone Encounter (Signed)
[facility] is requesting to fill pt advir. [name] Long

## 2017-11-05 DIAGNOSIS — Z79891 Long term (current) use of opiate analgesic: Secondary | ICD-10-CM | POA: Diagnosis not present

## 2017-11-05 DIAGNOSIS — G894 Chronic pain syndrome: Secondary | ICD-10-CM | POA: Diagnosis not present

## 2017-11-05 DIAGNOSIS — M25551 Pain in right hip: Secondary | ICD-10-CM | POA: Diagnosis not present

## 2017-11-05 DIAGNOSIS — M150 Primary generalized (osteo)arthritis: Secondary | ICD-10-CM | POA: Diagnosis not present

## 2017-11-16 ENCOUNTER — Encounter (INDEPENDENT_AMBULATORY_CARE_PROVIDER_SITE_OTHER): Payer: PPO | Admitting: Ophthalmology

## 2017-11-19 ENCOUNTER — Emergency Department (HOSPITAL_COMMUNITY): Payer: PPO

## 2017-11-19 ENCOUNTER — Ambulatory Visit (INDEPENDENT_AMBULATORY_CARE_PROVIDER_SITE_OTHER): Payer: PPO | Admitting: Family Medicine

## 2017-11-19 ENCOUNTER — Encounter (HOSPITAL_COMMUNITY): Payer: Self-pay | Admitting: Emergency Medicine

## 2017-11-19 ENCOUNTER — Other Ambulatory Visit: Payer: Self-pay

## 2017-11-19 ENCOUNTER — Inpatient Hospital Stay (HOSPITAL_COMMUNITY)
Admission: EM | Admit: 2017-11-19 | Discharge: 2017-11-23 | DRG: 193 | Disposition: A | Discharge status: Home / Self Care | Payer: PPO | Attending: Internal Medicine | Admitting: Internal Medicine

## 2017-11-19 VITALS — BP 144/80 | HR 120 | Temp 99.0°F | Resp 24 | Wt 170.4 lb

## 2017-11-19 DIAGNOSIS — Z90710 Acquired absence of both cervix and uterus: Secondary | ICD-10-CM | POA: Diagnosis not present

## 2017-11-19 DIAGNOSIS — Z885 Allergy status to narcotic agent status: Secondary | ICD-10-CM

## 2017-11-19 DIAGNOSIS — Z981 Arthrodesis status: Secondary | ICD-10-CM

## 2017-11-19 DIAGNOSIS — J9601 Acute respiratory failure with hypoxia: Secondary | ICD-10-CM | POA: Diagnosis present

## 2017-11-19 DIAGNOSIS — M069 Rheumatoid arthritis, unspecified: Secondary | ICD-10-CM | POA: Diagnosis present

## 2017-11-19 DIAGNOSIS — I2510 Atherosclerotic heart disease of native coronary artery without angina pectoris: Secondary | ICD-10-CM | POA: Diagnosis not present

## 2017-11-19 DIAGNOSIS — G8929 Other chronic pain: Secondary | ICD-10-CM | POA: Diagnosis present

## 2017-11-19 DIAGNOSIS — R059 Cough, unspecified: Secondary | ICD-10-CM

## 2017-11-19 DIAGNOSIS — Z951 Presence of aortocoronary bypass graft: Secondary | ICD-10-CM

## 2017-11-19 DIAGNOSIS — M5030 Other cervical disc degeneration, unspecified cervical region: Secondary | ICD-10-CM | POA: Diagnosis present

## 2017-11-19 DIAGNOSIS — I10 Essential (primary) hypertension: Secondary | ICD-10-CM | POA: Diagnosis not present

## 2017-11-19 DIAGNOSIS — K529 Noninfective gastroenteritis and colitis, unspecified: Secondary | ICD-10-CM

## 2017-11-19 DIAGNOSIS — R197 Diarrhea, unspecified: Secondary | ICD-10-CM | POA: Diagnosis present

## 2017-11-19 DIAGNOSIS — R0789 Other chest pain: Secondary | ICD-10-CM | POA: Diagnosis not present

## 2017-11-19 DIAGNOSIS — M940 Chondrocostal junction syndrome [Tietze]: Secondary | ICD-10-CM | POA: Diagnosis not present

## 2017-11-19 DIAGNOSIS — Z888 Allergy status to other drugs, medicaments and biological substances status: Secondary | ICD-10-CM | POA: Diagnosis not present

## 2017-11-19 DIAGNOSIS — J45909 Unspecified asthma, uncomplicated: Secondary | ICD-10-CM | POA: Diagnosis not present

## 2017-11-19 DIAGNOSIS — R0902 Hypoxemia: Secondary | ICD-10-CM

## 2017-11-19 DIAGNOSIS — E785 Hyperlipidemia, unspecified: Secondary | ICD-10-CM | POA: Diagnosis present

## 2017-11-19 DIAGNOSIS — M797 Fibromyalgia: Secondary | ICD-10-CM

## 2017-11-19 DIAGNOSIS — K219 Gastro-esophageal reflux disease without esophagitis: Secondary | ICD-10-CM | POA: Diagnosis present

## 2017-11-19 DIAGNOSIS — B59 Pneumocystosis: Secondary | ICD-10-CM | POA: Diagnosis not present

## 2017-11-19 DIAGNOSIS — Z8249 Family history of ischemic heart disease and other diseases of the circulatory system: Secondary | ICD-10-CM | POA: Diagnosis not present

## 2017-11-19 DIAGNOSIS — I700 Atherosclerosis of aorta: Secondary | ICD-10-CM | POA: Diagnosis not present

## 2017-11-19 DIAGNOSIS — I340 Nonrheumatic mitral (valve) insufficiency: Secondary | ICD-10-CM | POA: Diagnosis not present

## 2017-11-19 DIAGNOSIS — Z87891 Personal history of nicotine dependence: Secondary | ICD-10-CM

## 2017-11-19 DIAGNOSIS — J181 Lobar pneumonia, unspecified organism: Principal | ICD-10-CM

## 2017-11-19 DIAGNOSIS — J189 Pneumonia, unspecified organism: Secondary | ICD-10-CM | POA: Diagnosis present

## 2017-11-19 DIAGNOSIS — R0602 Shortness of breath: Secondary | ICD-10-CM | POA: Diagnosis not present

## 2017-11-19 DIAGNOSIS — Z905 Acquired absence of kidney: Secondary | ICD-10-CM | POA: Diagnosis not present

## 2017-11-19 DIAGNOSIS — R05 Cough: Secondary | ICD-10-CM

## 2017-11-19 LAB — CBC WITH DIFFERENTIAL/PLATELET
Abs Immature Granulocytes: 0.02 10*3/uL (ref 0.00–0.07)
BASOS PCT: 0 %
Basophils Absolute: 0 10*3/uL (ref 0.0–0.1)
EOS ABS: 0 10*3/uL (ref 0.0–0.5)
Eosinophils Relative: 0 %
HCT: 41.4 % (ref 36.0–46.0)
Hemoglobin: 13.4 g/dL (ref 12.0–15.0)
IMMATURE GRANULOCYTES: 0 %
LYMPHS ABS: 0.6 10*3/uL — AB (ref 0.7–4.0)
Lymphocytes Relative: 8 %
MCH: 28.5 pg (ref 26.0–34.0)
MCHC: 32.4 g/dL (ref 30.0–36.0)
MCV: 88.1 fL (ref 80.0–100.0)
Monocytes Absolute: 0.7 10*3/uL (ref 0.1–1.0)
Monocytes Relative: 9 %
NRBC: 0 % (ref 0.0–0.2)
Neutro Abs: 6.3 10*3/uL (ref 1.7–7.7)
Neutrophils Relative %: 83 %
PLATELETS: 146 10*3/uL — AB (ref 150–400)
RBC: 4.7 MIL/uL (ref 3.87–5.11)
RDW: 12.7 % (ref 11.5–15.5)
WBC: 7.6 10*3/uL (ref 4.0–10.5)

## 2017-11-19 LAB — BASIC METABOLIC PANEL
Anion gap: 11 (ref 5–15)
BUN: 26 mg/dL — ABNORMAL HIGH (ref 8–23)
CALCIUM: 9 mg/dL (ref 8.9–10.3)
CO2: 23 mmol/L (ref 22–32)
Chloride: 102 mmol/L (ref 98–111)
Creatinine, Ser: 1.12 mg/dL — ABNORMAL HIGH (ref 0.44–1.00)
GFR calc Af Amer: 54 mL/min — ABNORMAL LOW (ref >60)
GFR calc non Af Amer: 46 mL/min — ABNORMAL LOW (ref >60)
Glucose, Bld: 127 mg/dL — ABNORMAL HIGH (ref 70–99)
Potassium: 4.2 mmol/L (ref 3.5–5.1)
Sodium: 136 mmol/L (ref 135–145)

## 2017-11-19 LAB — INFLUENZA PANEL BY PCR (TYPE A & B)
Influenza A By PCR: NEGATIVE
Influenza B By PCR: NEGATIVE

## 2017-11-19 LAB — I-STAT CG4 LACTIC ACID, ED: LACTIC ACID, VENOUS: 1.67 mmol/L (ref 0.5–1.9)

## 2017-11-19 LAB — TROPONIN I: TROPONIN I: 0.08 ng/mL — AB (ref <0.03)

## 2017-11-19 LAB — BRAIN NATRIURETIC PEPTIDE: B Natriuretic Peptide: 86.1 pg/mL (ref 0.0–100.0)

## 2017-11-19 LAB — I-STAT TROPONIN, ED: TROPONIN I, POC: 0.06 ng/mL (ref 0.00–0.08)

## 2017-11-19 MED ORDER — IPRATROPIUM-ALBUTEROL 0.5-2.5 (3) MG/3ML IN SOLN
3.0000 mL | Freq: Four times a day (QID) | RESPIRATORY_TRACT | Status: DC | PRN
  Administered 2017-11-19: 3 mL via RESPIRATORY_TRACT
  Filled 2017-11-19: qty 3

## 2017-11-19 MED ORDER — ENSURE ENLIVE PO LIQD
237.0000 mL | Freq: Two times a day (BID) | ORAL | Status: DC
  Administered 2017-11-19 – 2017-11-20 (×2): 237 mL via ORAL

## 2017-11-19 MED ORDER — MONTELUKAST SODIUM 10 MG PO TABS
10.0000 mg | ORAL_TABLET | Freq: Every day | ORAL | Status: DC
  Administered 2017-11-19 – 2017-11-22 (×4): 10 mg via ORAL
  Filled 2017-11-19 (×4): qty 1

## 2017-11-19 MED ORDER — ENOXAPARIN SODIUM 40 MG/0.4ML ~~LOC~~ SOLN
40.0000 mg | SUBCUTANEOUS | Status: DC
  Administered 2017-11-19 – 2017-11-22 (×4): 40 mg via SUBCUTANEOUS
  Filled 2017-11-19 (×4): qty 0.4

## 2017-11-19 MED ORDER — SODIUM CHLORIDE 0.9 % IV SOLN
1.0000 g | Freq: Once | INTRAVENOUS | Status: AC
  Administered 2017-11-19: 1 g via INTRAVENOUS
  Filled 2017-11-19: qty 10

## 2017-11-19 MED ORDER — SODIUM CHLORIDE 0.9 % IV BOLUS
1000.0000 mL | Freq: Once | INTRAVENOUS | Status: AC
  Administered 2017-11-19: 1000 mL via INTRAVENOUS

## 2017-11-19 MED ORDER — ASPIRIN EC 81 MG PO TBEC
81.0000 mg | DELAYED_RELEASE_TABLET | Freq: Every day | ORAL | Status: DC
  Administered 2017-11-19 – 2017-11-23 (×5): 81 mg via ORAL
  Filled 2017-11-19 (×5): qty 1

## 2017-11-19 MED ORDER — ATORVASTATIN CALCIUM 20 MG PO TABS
20.0000 mg | ORAL_TABLET | Freq: Every day | ORAL | Status: DC
  Administered 2017-11-19 – 2017-11-22 (×4): 20 mg via ORAL
  Filled 2017-11-19: qty 1
  Filled 2017-11-19: qty 2
  Filled 2017-11-19 (×2): qty 1
  Filled 2017-11-19 (×2): qty 2
  Filled 2017-11-19: qty 1
  Filled 2017-11-19: qty 2

## 2017-11-19 MED ORDER — AZITHROMYCIN 250 MG PO TABS
500.0000 mg | ORAL_TABLET | Freq: Once | ORAL | Status: AC
  Administered 2017-11-19: 500 mg via ORAL
  Filled 2017-11-19: qty 2

## 2017-11-19 MED ORDER — BISACODYL 5 MG PO TBEC: 5.0000 mg | DELAYED_RELEASE_TABLET | Freq: Every day | ORAL | Status: DC | PRN

## 2017-11-19 MED ORDER — HYDROMORPHONE HCL 2 MG PO TABS
4.0000 mg | ORAL_TABLET | Freq: Two times a day (BID) | ORAL | Status: DC | PRN
  Administered 2017-11-20 – 2017-11-22 (×2): 4 mg via ORAL
  Filled 2017-11-19 (×2): qty 2

## 2017-11-19 MED ORDER — VITAMIN D (ERGOCALCIFEROL) 1.25 MG (50000 UNIT) PO CAPS: 50000.0000 [IU] | ORAL_CAPSULE | ORAL | Status: DC

## 2017-11-19 MED ORDER — MOMETASONE FURO-FORMOTEROL FUM 200-5 MCG/ACT IN AERO
2.0000 | INHALATION_SPRAY | Freq: Two times a day (BID) | RESPIRATORY_TRACT | Status: DC
  Administered 2017-11-19 – 2017-11-23 (×8): 2 via RESPIRATORY_TRACT
  Filled 2017-11-19: qty 8.8

## 2017-11-19 MED ORDER — BUTALBITAL-APAP-CAFFEINE 50-325-40 MG PO TABS: 1.0000 | ORAL_TABLET | Freq: Two times a day (BID) | ORAL | Status: DC | PRN

## 2017-11-19 MED ORDER — GABAPENTIN 400 MG PO CAPS
800.0000 mg | ORAL_CAPSULE | Freq: Three times a day (TID) | ORAL | Status: DC
  Administered 2017-11-19 – 2017-11-23 (×11): 800 mg via ORAL
  Filled 2017-11-19 (×11): qty 2

## 2017-11-19 MED ORDER — VITAMIN D 25 MCG (1000 UNIT) PO TABS
1000.0000 [IU] | ORAL_TABLET | Freq: Every day | ORAL | Status: DC
  Administered 2017-11-20 – 2017-11-23 (×4): 1000 [IU] via ORAL
  Filled 2017-11-19 (×4): qty 1

## 2017-11-19 MED ORDER — SODIUM CHLORIDE 0.9 % IV SOLN
1.5000 g | Freq: Four times a day (QID) | INTRAVENOUS | Status: DC
  Administered 2017-11-19 – 2017-11-21 (×9): 1.5 g via INTRAVENOUS
  Filled 2017-11-19 (×12): qty 1.5

## 2017-11-19 MED ORDER — METOCLOPRAMIDE HCL 5 MG/ML IJ SOLN
10.0000 mg | Freq: Once | INTRAMUSCULAR | Status: AC
  Administered 2017-11-19: 10 mg via INTRAVENOUS
  Filled 2017-11-19: qty 2

## 2017-11-19 MED ORDER — TAMSULOSIN HCL 0.4 MG PO CAPS
0.4000 mg | ORAL_CAPSULE | Freq: Every day | ORAL | Status: DC
  Administered 2017-11-19 – 2017-11-22 (×4): 0.4 mg via ORAL
  Filled 2017-11-19 (×5): qty 1

## 2017-11-19 MED ORDER — ONDANSETRON HCL 4 MG PO TABS
4.0000 mg | ORAL_TABLET | Freq: Four times a day (QID) | ORAL | Status: DC | PRN
  Administered 2017-11-19: 4 mg via ORAL
  Filled 2017-11-19: qty 1

## 2017-11-19 MED ORDER — METOPROLOL TARTRATE 25 MG PO TABS
25.0000 mg | ORAL_TABLET | Freq: Two times a day (BID) | ORAL | Status: DC
  Administered 2017-11-19 – 2017-11-23 (×8): 25 mg via ORAL
  Filled 2017-11-19 (×8): qty 1

## 2017-11-19 MED ORDER — KETOROLAC TROMETHAMINE 15 MG/ML IJ SOLN
15.0000 mg | Freq: Once | INTRAMUSCULAR | Status: AC
  Administered 2017-11-19: 15 mg via INTRAVENOUS
  Filled 2017-11-19: qty 1

## 2017-11-19 MED ORDER — MAGNESIUM OXIDE 400 (241.3 MG) MG PO TABS
400.0000 mg | ORAL_TABLET | Freq: Every day | ORAL | Status: DC
  Administered 2017-11-19 – 2017-11-21 (×3): 400 mg via ORAL
  Filled 2017-11-19 (×3): qty 1

## 2017-11-19 MED ORDER — CALCIUM CARBONATE-VITAMIN D 500-200 MG-UNIT PO TABS
1.0000 | ORAL_TABLET | Freq: Every day | ORAL | Status: DC
  Administered 2017-11-20 – 2017-11-23 (×4): 1 via ORAL
  Filled 2017-11-19 (×4): qty 1

## 2017-11-19 MED ORDER — SODIUM CHLORIDE 0.9 % IV SOLN: INTRAVENOUS | Status: DC | PRN

## 2017-11-19 MED ORDER — HYDROXYCHLOROQUINE SULFATE 200 MG PO TABS
200.0000 mg | ORAL_TABLET | Freq: Every day | ORAL | Status: DC
  Administered 2017-11-19 – 2017-11-22 (×4): 200 mg via ORAL
  Filled 2017-11-19 (×4): qty 1

## 2017-11-19 MED ORDER — ONDANSETRON HCL 4 MG/2ML IJ SOLN: 4.0000 mg | Freq: Four times a day (QID) | INTRAMUSCULAR | Status: DC | PRN

## 2017-11-19 MED ORDER — GABAPENTIN 800 MG PO TABS
800.0000 mg | ORAL_TABLET | Freq: Three times a day (TID) | ORAL | Status: DC
  Filled 2017-11-19: qty 1

## 2017-11-19 NOTE — ED Triage Notes (Signed)
Pt arrives to ED from home with complaints of SOB and cough since Friday. Pt reports starting yesterday she had an episode of diarrhea and vomiting. Pt placed in position of comfort with bed locked and lowered, call bell in reach.

## 2017-11-19 NOTE — ED Notes (Signed)
Pt returned to room from radiology

## 2017-11-19 NOTE — ED Provider Notes (Signed)
[facility] EM[facility] DEPARTMENT Provider Note   CSN: [id number] Arrival date & time: 11/19/17  1207     History   Chief Complaint Chief Complaint  Patient presents with  . Shortness of Breath  . Cough    HPI [name] Long is a 77 y.o. female.  She said she has been feeling sick since Thursday or Friday.  Started with some cough and shortness of breath.  She is felt hot and cold but no documented fever.  Yesterday she started having some loose stools.  She has had no appetite and feels nauseous.  She is also had a headache and body aches.  She did have a flu shot and her other vaccines.  No sick contacts.  She is tried nothing for it.  She states she has a history of migraines.  No numbness no weakness no sinus pressure no sore throat.  No urinary symptoms.  The history is provided by the patient.  Shortness of Breath  This is a new problem. The current episode started more than 2 days ago. The problem has not changed since onset.Associated symptoms include headaches, cough, sputum production and wheezing. Pertinent negatives include no fever, no sore throat, no ear pain, no hemoptysis, no chest pain, no syncope, no vomiting, no abdominal pain, no rash, no leg pain and no leg swelling. It is unknown what precipitated the problem. She has tried beta-agonist inhalers for the symptoms. The treatment provided mild relief.  Cough  Associated symptoms include headaches, shortness of breath and wheezing. Pertinent negatives include no chest pain, no ear pain and no sore throat.    Past Medical History:  Diagnosis Date  . Abnormal EKG 01/12/2011  . Allergy    RHINITIS  . Arthritis   . Asthma   . Asthma 01/12/2011  . Back pain, chronic--plans were for cervical disc surgery week of 01/16/11 01/12/2011  . Blood clot in vein 2004   L leg  . Blood transfusion without reported diagnosis 2004  . Chronic low back pain   . Complication of anesthesia   . Connective tissue disease  ([place])   . Coronary artery disease   . Crescendo angina, with EKG changes, new. 01/12/2011  . Dyspnea   . Fibromyalgia   . Fibromyalgia   . Headache    migraine - several in a yr.   . History of stress test 04/2011   Normal stress test  . Hx of echocardiogram 07/19/2011   EF 50-55%. There is mild annular calcification, there is trace migtral regurgitation, there is trace tricupid regurgitation, pericardium is thick, there is no pericardial effusion.  . Hypertension   . Myocardial infarction [facility]) 2013   triple heart bypass  . Neuromuscular disorder (HCC)    fibromyalgia   . Osteoporosis    OSTEOPENIA  . Personal history of colonic polyp - adenoma 08/05/2013   08/2013 - 7 mm adenoma - consider repeat colonoscopy 2020 (age 86)  . Pneumonia    numerous times, only admitted one time for pneumonia but she has had pneumonia numerous times  . PONV (postoperative nausea and vomiting)   . PPD positive   . Status post coronary artery bypass grafting   . Urinary incontinence    post op, "sleepy bladder", urinar retention     Patient Active Problem List   Diagnosis Date Noted  . Vitamin D deficiency 07/23/2017  . Osteopenia 07/23/2017  . Essential hypertension 05/15/2017  . Primary localized osteoarthritis of right hip 08/22/2016  .  Primary osteoarthritis of right hip 08/22/2016  . Arthritis of right hip 05/01/2016  . Lumbar radiculopathy, right 03/06/2016  . Personal history of colonic polyp - adenoma 08/05/2013  . GERD (gastroesophageal reflux disease) 03/11/2013  . Rheumatoid arthritis ([place]) 03/11/2013  . High frequency hearing loss 03/11/2013  . Tinnitus 03/11/2013  . Hyperlipidemia 08/21/2012  . S/P CABG x 3: (lima-lad,svg-om,svg-rca) 01/16/2011  . CAD (coronary artery disease): multivessel, good LVF 50% at cath 01/16/2011  . Back pain, chronic--plans were for cervical disc surgery week of 01/16/11 01/12/2011  . Asthma, chronic 01/12/2011  . Fibromyalgia 01/12/2011    Past  Surgical History:  Procedure Laterality Date  . ABDOMINAL HYSTERECTOMY  1981  . APPENDECTOMY  1946  . BLADDER REPAIR  1985  . BREAST SURGERY  1992   REDUCTION, x3 surgeries overall on breast for augmentation   . CARDIAC CATHETERIZATION  2013  . CARPAL TUNNEL RELEASE Bilateral 1988, 1999  . Guadalupe Guerra  . CORONARY ARTERY BYPASS GRAFT  01/14/2011   Procedure: CORONARY ARTERY BYPASS GRAFTING (CABG);  Surgeon: Tharon Aquas Adelene Idler, MD;  Location: [facility];  Service: Open Heart Surgery;  Laterality: N/A;  CABG x three, using right greater saphenous vein harvested endoscopically  . EYE SURGERY Bilateral 1985   radiokerototomy  . LEFT HEART CATHETERIZATION WITH CORONARY ANGIOGRAM N/A 01/13/2011   Procedure: LEFT HEART CATHETERIZATION WITH CORONARY ANGIOGRAM;  Surgeon: Troy Sine, MD;  Location: [facility] CATH LAB;  Service: Cardiovascular;  Laterality: N/A;  . PUBOVAGINAL SLING  2002  . STERNAL WOUND DEBRIDEMENT  07/27/2011   Procedure: STERNAL WOUND DEBRIDEMENT;  followed by wound vac Surgeon: Ivin Poot, MD;  Location: [facility];  Service: Open Heart Surgery;  Laterality: N/A;  . TONSILLECTOMY  1952  . TOTAL HIP ARTHROPLASTY Right 08/22/2016   Procedure: TOTAL HIP ARTHROPLASTY ANTERIOR APPROACH;  Surgeon: Melrose Nakayama, MD;  Location: [facility];  Service: Orthopedics;  Laterality: Right;  . TRIGGER FINGER RELEASE Right 2014   4th finger  . TROCHANTERIC BURSA EXCISION Right    bursa removed, R hip  . TUBAL LIGATION       OB History   None      Home Medications    Prior to Admission medications   Medication Sig Start Date End Date Taking? Authorizing Provider  ADVAIR DISKUS 500-50 MCG/DOSE AEPB INHALE ONE PUFF BY MOUTH INTO THE LUNGS TWICE A DAY 11/02/17   Denita Lung, MD  albuterol (PROVENTIL HFA) 108 (90 Base) MCG/ACT inhaler INHALE 2 PUFF AS DIRECTED FOUR TIMES A DAY AS NEEDED Patient taking differently: Inhale 2 puffs into the lungs every 6 (six) hours as needed  for wheezing or shortness of breath. INHALE 2 PUFF AS DIRECTED FOUR TIMES A DAY AS NEEDED 07/09/15   Denita Lung, MD  aspirin EC 81 MG tablet Take 81 mg by mouth daily.    [provider]  atorvastatin (LIPITOR) 20 MG tablet TAKE ONE TABLET BY MOUTH DAILY **MUST CALL MD FOR APPOINTMENT 09/12/17   Denita Lung, MD  butalbital-acetaminophen-caffeine (FIORICET, ESGIC) [id number] MG tablet Take 1 tablet by mouth every 6 (six) hours as needed for headache.     [provider]  Calcium Carbonate-Vit D-Min (CALCIUM 1200 PO) Take 1 tablet by mouth daily.    [provider]  carisoprodol (SOMA) 350 MG tablet Take 350 mg by mouth 2 (two) times daily as needed for muscle spasms.     [provider]  cholecalciferol (VITAMIN D) 1000 units tablet Take 1,000 Units by mouth daily.    [provider]  gabapentin (NEURONTIN) 600 MG tablet Takes 2 tablets at noon and 3 tablets at bedtime    [provider]  HYDROmorphone (DILAUDID) 4 MG tablet Take 4 mg by mouth 3 (three) times daily as needed for severe pain. For pain     [provider]  hydroxychloroquine (PLAQUENIL) 200 MG tablet Take 200 mg by mouth every other day.     [provider]  magnesium oxide (MAG-OX) 400 MG tablet Take 400 mg by mouth at bedtime.    [provider]  metoprolol tartrate (LOPRESSOR) 25 MG tablet TAKE ONE TABLET BY MOUTH TWICE A DAY 07/02/17   Lorretta Harp, MD  montelukast (SINGULAIR) 10 MG tablet TAKE ONE TABLET BY MOUTH DAILY 09/10/17   Denita Lung, MD  Polyethyl Glycol-Propyl Glycol (SYSTANE OP) Apply 1 drop to eye as needed (DRY EYES).     [provider]  silodosin (RAPAFLO) 8 MG CAPS capsule Take 8 mg by mouth daily with breakfast.    [provider]  Vitamin D, Ergocalciferol, (DRISDOL) 50000 units CAPS capsule TAKE 1 CAPSULE BY MOUTH EVERY 7 DAYS Patient not taking: Reported on 10/01/2017 02/09/17   Lyndal Pulley, DO     Family History Family History  Adopted: Yes  Problem Relation Age of Onset  . Hypertension Brother     Social History Social History   Tobacco Use  . Smoking status: Former Smoker    Last attempt to quit: 01/12/1980    Years since quitting: 37.8  . Smokeless tobacco: Never Used  Substance Use Topics  . Alcohol use: No    Alcohol/week: 0.0 standard drinks  . Drug use: No     Allergies   Morphine and related; Percocet [oxycodone-acetaminophen]; and Zolpidem   Review of Systems Review of Systems  Constitutional: Negative for fever.  HENT: Negative for ear pain and sore throat.   Eyes: Negative for visual disturbance.  Respiratory: Positive for cough, sputum production, shortness of breath and wheezing. Negative for hemoptysis.   Cardiovascular: Negative for chest pain, leg swelling and syncope.  Gastrointestinal: Negative for abdominal pain and vomiting.  Genitourinary: Negative for dysuria.  Musculoskeletal: Negative for back pain.  Skin: Negative for rash.  Neurological: Positive for headaches.     Physical Exam Updated Vital Signs BP 122/66   Pulse (!) 114   Temp 99.3 F (37.4 C) (Oral)   Resp 20   SpO2 92%   Physical Exam  Constitutional: She appears well-developed and well-nourished. No distress.  HENT:  Head: Normocephalic and atraumatic.  Eyes: Conjunctivae are normal.  Neck: Neck supple.  Cardiovascular: Normal rate and regular rhythm.  No murmur heard. Pulmonary/Chest: Effort normal. No respiratory distress. She has wheezes (few scattered).  Abdominal: Soft. There is no tenderness.  Musculoskeletal: She exhibits no edema or deformity.       Right lower leg: She exhibits no tenderness and no edema.       Left lower leg: She exhibits no tenderness and no edema.  Neurological: She is alert.  Skin: Skin is warm and dry. Capillary refill takes less than 2 seconds.  Psychiatric: She has a normal mood and affect.  Nursing note and vitals  reviewed.    ED Treatments / Results  Labs (all labs ordered are listed, but only abnormal results are displayed) Labs Reviewed  BASIC METABOLIC PANEL - Abnormal; Notable for the following  components:      Result Value   Glucose, Bld 127 (*)    BUN 26 (*)    Creatinine, Ser 1.12 (*)    GFR calc non Af Amer 46 (*)    GFR calc Af Amer 54 (*)    All other components within normal limits  CBC WITH DIFFERENTIAL/PLATELET - Abnormal; Notable for the following components:   Platelets 146 (*)    Lymphs Abs 0.6 (*)    All other components within normal limits  CULTURE, BLOOD (ROUTINE X 2)  CULTURE, BLOOD (ROUTINE X 2)  EXPECTORATED SPUTUM ASSESSMENT W REFEX TO RESP CULTURE  C DIFFICILE QUICK SCREEN W PCR REFLEX  RESPIRATORY PANEL BY PCR  STOOL CULTURE  INFLUENZA PANEL BY PCR (TYPE A & B)  BRAIN NATRIURETIC PEPTIDE  TROPONIN I  TROPONIN I  TROPONIN I  LEGIONELLA PNEUMOPHILA SEROGP 1 UR AG  I-STAT TROPONIN, ED  I-STAT CG4 LACTIC ACID, ED    EKG EKG Interpretation  Date/Time:  Monday November 19 2017 12:22:20 EST Ventricular Rate:  112 PR Interval:    QRS Duration: 169 QT Interval:  323 QTC Calculation: 441 R Axis:   61 Text Interpretation:  Sinus tachycardia Ventricular premature complex Nonspecific intraventricular conduction delay Borderline repolarization abnormality Baseline wander in lead(s) V5 V6 ST depressions anterior/inferior new from prior 7/13 Confirmed by Aletta Edouard [phone]) on 11/19/2017 12:27:59 PM   Radiology Dg Chest 2 View  Result Date: 11/19/2017 CLINICAL DATA:  Productive cough. EXAM: CHEST - 2 VIEW COMPARISON:  Two-view chest x-ray 10/04/2017. FINDINGS: The heart size is normal. Bilateral medial upper lobe airspace opacities are new since the prior exam, left greater than right. There is no edema or effusion. No other airspace consolidation is present. Degenerative changes of the thoracic spine are stable. IMPRESSION: 1. New left greater than right  upper lobe airspace disease is concerning for pneumonia. Electronically Signed   By: San Morelle M.D.   On: 11/19/2017 13:36    Procedures Procedures (including critical care time)  Medications Ordered in ED Medications  sodium chloride 0.9 % bolus 1,000 mL (has no administration in time range)  ketorolac (TORADOL) 15 MG/ML injection 15 mg (has no administration in time range)  metoCLOPramide (REGLAN) injection 10 mg (has no administration in time range)     Initial Impression / Assessment and Plan / ED Course  I have reviewed the triage vital signs and the nursing notes.  Pertinent labs & imaging results that were available during my care of the patient were reviewed by me and considered in my medical decision making (see chart for details).  Clinical Course as of Nov 20 1831  Mon Nov 19, 2017  1341 Patient went to the primary care doctor's office today for this cough and shortness of breath and was found to be hypoxic with a sat of 88%.  Is a low-grade temp here and is tachycardic.  Her chest x-ray is read as probable pneumonia.  Will cover with antibiotics.   [MB]  1501 Discussed with Dr. Earnest Conroy any from the hospitalist group who will evaluate for admission.  Patient has been updated on plan.   [MB]    Clinical Course User Index [MB] Hayden Rasmussen, MD      Final Clinical Impressions(s) / ED Diagnoses   Final diagnoses:  Community acquired pneumonia, unspecified laterality    ED Discharge Orders    None       Hayden Rasmussen, MD 11/19/17 [phone]

## 2017-11-19 NOTE — ED Notes (Signed)
Called lab to add on BNP. ?

## 2017-11-19 NOTE — Progress Notes (Signed)
   Subjective:    Patient ID: [name] Long, female    DOB: 1940/01/20, 77 y.o.   MRN: [id number]  HPI She states that Thursday she developed a headache that she describes as a migraine.  Friday she had difficulty with cough, weakness, chills.  The symptoms of gotten progressively worse.  On Sunday she developed difficulty with diarrhea and vomiting.  She is here for follow-up on that.   Review of Systems     Objective:   Physical Exam Alert and toxic appearing, tachypneic with pulse ox of 87% tympanic membranes and canals are normal. Pharyngeal area is normal. Neck is supple without adenopathy or thyromegaly. Cardiac exam shows a regular sinus rhythm without murmurs or gallops. Lungs are clear to auscultation.        Assessment & Plan:  Hypoxia  Cough She was instructed to go to the emergency room.  A note was written given her pulse ox and her respiratory rate.

## 2017-11-19 NOTE — H&P (Addendum)
History and Physical    DOA: 11/19/2017  PCP: Denita Lung, MD  Patient coming from: Home  Chief Complaint: Cough and shortness of breath x3 days  HPI: [name] Long is a 77 y.o. female with history h/o asthma, CAD, chronic back pain secondary to degenerative disc disease, arthritis, fibromyalgia who is on multiple pain medication/muscle relaxants presents with complaints of cough and shortness of breath for 3 days.  Patient reports diffuse headache 8/10 on Thursday and developed productive cough with green phlegm associated with chills on Friday.  Patient states she did not feel feverish but she did have rigors and felt short of breath on Friday.  Her symptoms progressively worsened over the weekend.  Yesterday she also had nausea associated with vomiting as well as several loose stools.  She states she still has some diarrhea.  She denies any leg swellings or left-sided or retrosternal chest pain but points to lower sternal/epigastric area and states "hurts only when I cough".  Patient is accompanied by her husband who states she has been sleeping a lot over the last couple of days due to her illness.  She denies any sick contacts.  She does have pets (2 dogs and 4 cats) at home but none sick.  She is up-to-date with flu and pneumonia shots.  She went to her PCP office today where she was noted to have pulse ox of 88% with tachycardia.  She was referred to ED for further evaluation.  Patient denies any history of hemoptysis or weight loss.  Work-up in the ED revealed negative flu, WBC 7.6, troponin 0 0.06 and abnormal EKG.  Chest x-ray showed bilateral upper lobe hazy infiltrates.  She is saturating well on 2 L nasal cannula.   Review of Systems: As per HPI otherwise 10 point review of systems negative.    Past Medical History:  Diagnosis Date  . Abnormal EKG 01/12/2011  . Allergy    RHINITIS  . Arthritis   . Asthma   . Asthma 01/12/2011  . Back pain, chronic--plans were for cervical  disc surgery week of 01/16/11 01/12/2011  . Blood clot in vein 2004   L leg  . Blood transfusion without reported diagnosis 2004  . Chronic low back pain   . Complication of anesthesia   . Connective tissue disease ([place])   . Coronary artery disease   . Crescendo angina, with EKG changes, new. 01/12/2011  . Dyspnea   . Fibromyalgia   . Fibromyalgia   . Headache    migraine - several in a yr.   . History of stress test 04/2011   Normal stress test  . Hx of echocardiogram 07/19/2011   EF 50-55%. There is mild annular calcification, there is trace migtral regurgitation, there is trace tricupid regurgitation, pericardium is thick, there is no pericardial effusion.  . Hypertension   . Myocardial infarction [facility]) 2013   triple heart bypass  . Neuromuscular disorder (HCC)    fibromyalgia   . Osteoporosis    OSTEOPENIA  . Personal history of colonic polyp - adenoma 08/05/2013   08/2013 - 7 mm adenoma - consider repeat colonoscopy 2020 (age 7)  . Pneumonia    numerous times, only admitted one time for pneumonia but she has had pneumonia numerous times  . PONV (postoperative nausea and vomiting)   . PPD positive   . Status post coronary artery bypass grafting   . Urinary incontinence    post op, "sleepy bladder", urinar retention  Past Surgical History:  Procedure Laterality Date  . ABDOMINAL HYSTERECTOMY  1981  . APPENDECTOMY  1946  . BLADDER REPAIR  1985  . BREAST SURGERY  1992   REDUCTION, x3 surgeries overall on breast for augmentation   . CARDIAC CATHETERIZATION  2013  . CARPAL TUNNEL RELEASE Bilateral 1988, 1999  . [place]  . CORONARY ARTERY BYPASS GRAFT  01/14/2011   Procedure: CORONARY ARTERY BYPASS GRAFTING (CABG);  Surgeon: Tharon Aquas Adelene Idler, MD;  Location: [facility];  Service: Open Heart Surgery;  Laterality: N/A;  CABG x three, using right greater saphenous vein harvested endoscopically  . EYE SURGERY Bilateral 1985   radiokerototomy  . LEFT  HEART CATHETERIZATION WITH CORONARY ANGIOGRAM N/A 01/13/2011   Procedure: LEFT HEART CATHETERIZATION WITH CORONARY ANGIOGRAM;  Surgeon: Troy Sine, MD;  Location: [facility] CATH LAB;  Service: Cardiovascular;  Laterality: N/A;  . PUBOVAGINAL SLING  2002  . STERNAL WOUND DEBRIDEMENT  07/27/2011   Procedure: STERNAL WOUND DEBRIDEMENT;  followed by wound vac Surgeon: Ivin Poot, MD;  Location: [facility];  Service: Open Heart Surgery;  Laterality: N/A;  . TONSILLECTOMY  1952  . TOTAL HIP ARTHROPLASTY Right 08/22/2016   Procedure: TOTAL HIP ARTHROPLASTY ANTERIOR APPROACH;  Surgeon: Melrose Nakayama, MD;  Location: [facility];  Service: Orthopedics;  Laterality: Right;  . TRIGGER FINGER RELEASE Right 2014   4th finger  . TROCHANTERIC BURSA EXCISION Right    bursa removed, R hip  . TUBAL LIGATION      Social history:  reports that she quit smoking about 37 years ago. She has never used smokeless tobacco. She reports that she does not drink alcohol or use drugs.   Allergies  Allergen Reactions  . Dimenhydrinate Other (See Comments)    "locks up kidneys"  . Morphine And Related Nausea And Vomiting  . Percocet [Oxycodone-Acetaminophen] Other (See Comments)    hallucinations  . Zolpidem Other (See Comments)    Unusual behavior    Family History  Adopted: Yes  Problem Relation Age of Onset  . Hypertension Brother       Prior to Admission medications   Medication Sig Start Date End Date Taking? Authorizing Provider  ADVAIR DISKUS 500-50 MCG/DOSE AEPB INHALE ONE PUFF BY MOUTH INTO THE LUNGS TWICE A DAY Patient taking differently: Inhale 1 puff into the lungs 2 (two) times daily.  11/02/17  Yes Denita Lung, MD  albuterol (PROVENTIL HFA) 108 (90 Base) MCG/ACT inhaler INHALE 2 PUFF AS DIRECTED FOUR TIMES A DAY AS NEEDED Patient taking differently: Inhale 2 puffs into the lungs every 6 (six) hours as needed for wheezing or shortness of breath.  07/09/15  Yes Denita Lung, MD  aspirin EC 81 MG  tablet Take 81 mg by mouth daily.   Yes [provider]  atorvastatin (LIPITOR) 20 MG tablet TAKE ONE TABLET BY MOUTH DAILY **MUST CALL MD FOR APPOINTMENT Patient taking differently: Take 20 mg by mouth at bedtime.  09/12/17  Yes Denita Lung, MD  butalbital-acetaminophen-caffeine (FIORICET, ESGIC) [phone] MG tablet Take 1 tablet by mouth 2 (two) times daily as needed for headache.    Yes [provider]  Calcium Carbonate-Vitamin D (CALCIUM-D PO) Take 1 tablet by mouth daily.   Yes [provider]  carisoprodol (SOMA) 350 MG tablet Take 350 mg by mouth daily as needed (arthritis pain).    Yes [provider]  cholecalciferol (VITAMIN D3) 25 MCG (1000 UT) tablet Take  1,000 Units by mouth daily.   Yes [provider]  gabapentin (NEURONTIN) 600 MG tablet Take 1,200-1,800 mg by mouth See admin instructions. Take 2 tablets (1200 mg) by mouth every morning and 3 tablets (1800 mg) at bedtime   Yes [provider]  HYDROmorphone (DILAUDID) 4 MG tablet Take 4 mg by mouth 2 (two) times daily as needed for severe pain.    Yes [provider]  hydroxychloroquine (PLAQUENIL) 200 MG tablet Take 200 mg by mouth at bedtime.    Yes [provider]  magnesium oxide (MAG-OX) 400 MG tablet Take 400 mg by mouth daily.    Yes [provider]  metoprolol tartrate (LOPRESSOR) 25 MG tablet TAKE ONE TABLET BY MOUTH TWICE A DAY Patient taking differently: Take 25 mg by mouth 2 (two) times daily.  07/02/17  Yes Lorretta Harp, MD  montelukast (SINGULAIR) 10 MG tablet TAKE ONE TABLET BY MOUTH DAILY Patient taking differently: Take 10 mg by mouth at bedtime.  09/10/17  Yes Denita Lung, MD  Polyethyl Glycol-Propyl Glycol (SYSTANE OP) Apply 1 drop to eye daily as needed (dry eyes).    Yes [provider]  silodosin (RAPAFLO) 8 MG CAPS capsule Take 8 mg by mouth daily with breakfast.   Yes [provider]  Vitamin D,  Ergocalciferol, (DRISDOL) 50000 units CAPS capsule TAKE 1 CAPSULE BY MOUTH EVERY 7 DAYS Patient not taking: Reported on 10/01/2017 02/09/17   Lyndal Pulley, DO    Physical Exam: Vitals:   11/19/17 1515 11/19/17 1600 11/19/17 1615 11/19/17 1630  BP:  131/76 129/75 130/75  Pulse: 95 91 91 97  Resp: (!) 21 20 19 19   Temp:      TempSrc:      SpO2: 92% 93% 93% 95%    Constitutional: NAD, calm, comfortable Vitals:   11/19/17 1515 11/19/17 1600 11/19/17 1615 11/19/17 1630  BP:  131/76 129/75 130/75  Pulse: 95 91 91 97  Resp: (!) 21 20 19 19   Temp:      TempSrc:      SpO2: 92% 93% 93% 95%   Eyes: PERRL, lids and conjunctivae normal ENMT: Mucous membranes are moist. Posterior pharynx clear of any exudate or lesions.Normal dentition.  Neck: normal, supple, no masses, no thyromegaly Respiratory: clear to auscultation bilaterally, no wheezing, no crackles. Normal respiratory effort. No accessory muscle use.  Cardiovascular: Regular rate and rhythm, no murmurs / rubs / gallops. No extremity edema.  Reproducible chest pain along right parasternal area.  Pulses 2+ Abdomen: Mild epigastric tenderness with no rebound or guarding, no masses palpated. No hepatosplenomegaly. Bowel sounds positive.  Musculoskeletal: Reproducible pain along lower sternal as well as right-sided costochondral area along sixth rib, no clubbing / cyanosis. No joint deformity upper and lower extremities. Good ROM, no contractures. Normal muscle tone.  Neurologic: CN 2-12 grossly intact. Sensation intact, DTR normal. Strength 5/5 in all 4.  Psychiatric: Normal judgment and insight. Alert and oriented x 3. Normal mood.  SKIN/catheters: no rashes, lesions, ulcers. No induration  Labs on Admission: I have personally reviewed following labs and imaging studies  CBC: Recent Labs  Lab 11/19/17 1239  WBC 7.6  NEUTROABS 6.3  HGB 13.4  HCT 41.4  MCV 88.1  PLT 588*   Basic Metabolic Panel: Recent Labs  Lab  11/19/17 1239  NA 136  K 4.2  CL 102  CO2 23  GLUCOSE 127*  BUN 26*  CREATININE 1.12*  CALCIUM 9.0   GFR: Estimated  Creatinine Clearance: 43 mL/min (A) (by C-G formula based on SCr of 1.12 mg/dL (H)). Liver Function Tests: No results for input(s): AST, ALT, ALKPHOS, BILITOT, PROT, ALBUMIN in the last 168 hours. No results for input(s): LIPASE, AMYLASE in the last 168 hours. No results for input(s): AMMONIA in the last 168 hours. Coagulation Profile: No results for input(s): INR, PROTIME in the last 168 hours. Cardiac Enzymes: No results for input(s): CKTOTAL, CKMB, CKMBINDEX, TROPONINI in the last 168 hours. BNP (last 3 results) No results for input(s): PROBNP in the last 8760 hours. HbA1C: No results for input(s): HGBA1C in the last 72 hours. CBG: No results for input(s): GLUCAP in the last 168 hours. Lipid Profile: No results for input(s): CHOL, HDL, LDLCALC, TRIG, CHOLHDL, LDLDIRECT in the last 72 hours. Thyroid Function Tests: No results for input(s): TSH, T4TOTAL, FREET4, T3FREE, THYROIDAB in the last 72 hours. Anemia Panel: No results for input(s): VITAMINB12, FOLATE, FERRITIN, TIBC, IRON, RETICCTPCT in the last 72 hours. Urine analysis:    Component Value Date/Time   COLORURINE YELLOW 07/08/2017 0450   APPEARANCEUR CLEAR 07/08/2017 0450   LABSPEC 1.010 10/01/2017 1406   PHURINE 6.5 07/08/2017 0450   GLUCOSEU NEGATIVE 07/08/2017 0450   HGBUR NEGATIVE 07/08/2017 0450   BILIRUBINUR negative 10/01/2017 1406   KETONESUR negative 10/01/2017 1406   [place] 07/08/2017 0450   PROTEINUR negative 10/01/2017 1406   PROTEINUR NEGATIVE 07/08/2017 0450   UROBILINOGEN 0.2 08/13/2011 0741   NITRITE Negative 10/01/2017 1406   NITRITE NEGATIVE 07/08/2017 0450   LEUKOCYTESUR Negative 10/01/2017 1406    Radiological Exams on Admission: Dg Chest 2 View  Result Date: 11/19/2017 CLINICAL DATA:  Productive cough. EXAM: CHEST - 2 VIEW COMPARISON:  Two-view chest x-ray  10/04/2017. FINDINGS: The heart size is normal. Bilateral medial upper lobe airspace opacities are new since the prior exam, left greater than right. There is no edema or effusion. No other airspace consolidation is present. Degenerative changes of the thoracic spine are stable. IMPRESSION: 1. New left greater than right upper lobe airspace disease is concerning for pneumonia. Electronically Signed   By: San Morelle M.D.   On: 11/19/2017 13:36    EKG: Independently reviewed.  Sinus rhythm with diffuse T wave inversions.  Patient also has ST depressions less than 1 mm in V1 and V2.     Assessment and Plan:   1.  Bilateral upper lobe infiltrates/acute hypoxic respiratory failure: Patient received IV Rocephin and Zithromax in the ED for community-acquired pneumonia.  Flu test is negative.  Will send sputum for cultures.  Given multiple Pain medication/muscle relaxants on board, suspect aspiration.  Patient also had an episode of vomiting.  Will obtain swallow evaluation.  Patient agreeable to reducing gabapentin dosage.  Will change Rocephin to Unasyn to cover aspiration organisms.  Blood cultures sent from ED.  Given abnormal EKG and atypical chest x-ray findings, will check BNP and cycle troponins.  Will also check echo.  She does not have any clinical signs of fluid overload.  Taper O2 as tolerated.  2.  Fibromyalgia: Resume home medications.  Patient agreeable to reducing gabapentin dosage and changing from twice daily to 3 times daily dosing. She states she has not used Manufacturing engineer recently.  3.  Degenerative disc disease/chronic back pain: Patient on high dose of opiates (4 mg Dilaudid twice daily as needed) as outpatient.  Resumed for now.  Watch for signs of aspiration/sedation.  4.  Nausea vomiting diarrhea: Could be secondary to gastroenteritis.  Check urine  Legionella antigen and sputum for rhino/enterovirus.  Will add doxycycline to antibiotic regimen.  Will send stool studies.   Symptomatic management.  5.  CAD: Patient has reproducible chest pain consistent with costochondritis/musculoskeletal pain.  She does have some EKG changes.  Will cycle cardiac enzymes and obtain echo as discussed above. Last echo in January 2018 showed preserved EF.  Repeat EKG.  Resume home medications   DVT prophylaxis: Lovenox  Code Status: Full code  Family Communication: Discussed with patient. Health care proxy would be her husband  Admission status:  Patient admitted as inpatient as anticipated LOS greater than 2 midnights    Guilford Shi MD [facility] Pager [phone]   11/19/2017, 4:35 PM   Addendum: Repeat EKG at 647 shows similar findings to before with tall R waves in diffuse T wave inversions.  Second set of troponin is 0.08.  Discussed with cardiology fellow on-call who felt findings could be secondary to demand ischemia and recommended to continue cycling cardiac enzymes and obtain echo/BNP (which I ordered earlier today).  Consider formal consult if cardiac enzymes continue to spike or patient develops cardiac symptoms.

## 2017-11-19 NOTE — ED Notes (Signed)
Pt placed on 2L [place] for O2 of 88% RA.

## 2017-11-20 ENCOUNTER — Inpatient Hospital Stay (HOSPITAL_COMMUNITY): Payer: PPO

## 2017-11-20 DIAGNOSIS — I340 Nonrheumatic mitral (valve) insufficiency: Secondary | ICD-10-CM

## 2017-11-20 DIAGNOSIS — B59 Pneumocystosis: Secondary | ICD-10-CM

## 2017-11-20 DIAGNOSIS — J189 Pneumonia, unspecified organism: Secondary | ICD-10-CM

## 2017-11-20 LAB — RESPIRATORY PANEL BY PCR
ADENOVIRUS-RVPPCR: NOT DETECTED
Bordetella pertussis: NOT DETECTED
CORONAVIRUS HKU1-RVPPCR: NOT DETECTED
CORONAVIRUS NL63-RVPPCR: NOT DETECTED
Chlamydophila pneumoniae: NOT DETECTED
Coronavirus 229E: NOT DETECTED
Coronavirus OC43: NOT DETECTED
INFLUENZA A-RVPPCR: NOT DETECTED
Influenza B: NOT DETECTED
METAPNEUMOVIRUS-RVPPCR: NOT DETECTED
Mycoplasma pneumoniae: NOT DETECTED
PARAINFLUENZA VIRUS 1-RVPPCR: NOT DETECTED
PARAINFLUENZA VIRUS 2-RVPPCR: NOT DETECTED
PARAINFLUENZA VIRUS 3-RVPPCR: NOT DETECTED
PARAINFLUENZA VIRUS 4-RVPPCR: NOT DETECTED
RHINOVIRUS / ENTEROVIRUS - RVPPCR: NOT DETECTED
Respiratory Syncytial Virus: NOT DETECTED

## 2017-11-20 LAB — ECHOCARDIOGRAM COMPLETE: WEIGHTICAEL: 2726.4 [oz_av]

## 2017-11-20 LAB — C DIFFICILE QUICK SCREEN W PCR REFLEX
C DIFFICILE (CDIFF) INTERP: NOT DETECTED
C Diff antigen: NEGATIVE
C Diff toxin: NEGATIVE

## 2017-11-20 LAB — EXPECTORATED SPUTUM ASSESSMENT W REFEX TO RESP CULTURE

## 2017-11-20 LAB — EXPECTORATED SPUTUM ASSESSMENT W GRAM STAIN, RFLX TO RESP C

## 2017-11-20 LAB — TROPONIN I
TROPONIN I: 0.07 ng/mL — AB (ref <0.03)
Troponin I: 0.1 ng/mL (ref <0.03)

## 2017-11-20 MED ORDER — PHENOL 1.4 % MT LIQD
1.0000 | OROMUCOSAL | Status: DC | PRN
  Administered 2017-11-20: 1 via OROMUCOSAL
  Filled 2017-11-20: qty 177

## 2017-11-20 MED ORDER — BOOST / RESOURCE BREEZE PO LIQD CUSTOM
1.0000 | Freq: Two times a day (BID) | ORAL | Status: DC
  Administered 2017-11-20 – 2017-11-23 (×6): 1 via ORAL

## 2017-11-20 MED ORDER — GUAIFENESIN ER 600 MG PO TB12
600.0000 mg | ORAL_TABLET | Freq: Two times a day (BID) | ORAL | Status: DC
  Administered 2017-11-20 (×2): 600 mg via ORAL
  Filled 2017-11-20 (×3): qty 1

## 2017-11-20 MED ORDER — GUAIFENESIN-DM 100-10 MG/5ML PO SYRP
5.0000 mL | ORAL_SOLUTION | ORAL | Status: DC | PRN
  Administered 2017-11-20 – 2017-11-22 (×6): 5 mL via ORAL
  Filled 2017-11-20 (×8): qty 5

## 2017-11-20 MED ORDER — IPRATROPIUM-ALBUTEROL 0.5-2.5 (3) MG/3ML IN SOLN: 3.0000 mL | Freq: Four times a day (QID) | RESPIRATORY_TRACT | Status: DC

## 2017-11-20 MED ORDER — IPRATROPIUM-ALBUTEROL 0.5-2.5 (3) MG/3ML IN SOLN
3.0000 mL | Freq: Four times a day (QID) | RESPIRATORY_TRACT | Status: DC
  Administered 2017-11-20: 3 mL via RESPIRATORY_TRACT
  Filled 2017-11-20 (×2): qty 3

## 2017-11-20 MED ORDER — IPRATROPIUM-ALBUTEROL 0.5-2.5 (3) MG/3ML IN SOLN: 3.0000 mL | Freq: Three times a day (TID) | RESPIRATORY_TRACT | Status: DC

## 2017-11-20 NOTE — Progress Notes (Signed)
  Echocardiogram 2D Echocardiogram has been performed.  [name] Long 11/20/2017, 3:16 PM

## 2017-11-20 NOTE — Progress Notes (Addendum)
[facility] PROGRESS NOTE  [name] Long ZOX:[id number] DOB: 1940/03/07 DOA: 11/19/2017   PCP: Denita Lung, MD     Assessment/Plan: Active Problems:   Pneumonia   77 y.o. female with history h/o asthma, CAD, chronic back pain secondary to degenerative disc disease, arthritis, fibromyalgia who is on multiple pain medication/muscle relaxants presents with complaints of cough and shortness of breath for 3 days. Work-up in the ED revealed negative flu, WBC 7.6, troponin 0 0.06 and abnormal EKG.  Chest x-ray showed bilateral upper lobe hazy infiltrates.  admitted and treated for pneumonia  Assessment and plan  1.  Bilateral upper lobe infiltrates/acute hypoxic respiratory failure: Patient received IV Rocephin and Zithromax in the ED for community-acquired pneumonia.  Flu test is negative.   follow-up sputum for cultures.  Given multiple Pain medication/muscle relaxants on board, suspect aspiration.  Patient also had an episode of vomiting.  Currently pending swallow evaluation.   continue Unasyn to cover aspiration organisms. Switched to Augmentin for another 5 days prior to discharge. Follow-up Blood cultures  .  Cardiac enzymes equivocal, will obtain 2-D echo to rule out wall motion abnormalities.monitor patient on telemetry. Currently patient is still requiring 2 L of oxygen by nasal cannula. Wean oxygen.  . Patient is still wheezing and will be started on scheduled DuoNeb treatments.  Mucinex for cough.  2.  Fibromyalgia: Resume home medications.  Patient agreeable to reducing gabapentin dosage . She states she has not used Manufacturing engineer recently.  3.  Degenerative disc disease/chronic back pain: Patient on high dose of opiates (4 mg Dilaudid twice daily as needed) as outpatient.  Resumed for now.  Watch for signs of aspiration/sedation.  4.  Nausea vomiting diarrhea: Could be secondary to gastroenteritis.C. Difficile negative.   urine Legionella antigen  pending , , respiratory  panel pending.  follow stool culture.    Symptomatic management.improving  5.  CAD: Patient has reproducible chest pain consistent with costochondritis/musculoskeletal pain.  She does have some EKG changes in the lateral leads which appear to be unchanged since yesterday.  cardiac enzymes flat  , obtain echo as discussed above. Last echo in January 2018 showed preserved EF.  Repeat EKG. Continue aspirin, Lipitor, metoprolol,     DVT prophylaxsis Lovenox  Code Status:  Full code   Family Communication: Discussed in detail with the patient, all imaging results, lab results explained to the patient   Disposition Plan: anticipate discharge tomorrow if clinically improved     Consultants:  none  Procedures:  none  Antibiotics: Anti-infectives (From admission, onward)   Start     Dose/Rate Route Frequency Ordered Stop   11/19/17 2200  hydroxychloroquine (PLAQUENIL) tablet 200 mg     200 mg Oral Daily at bedtime 11/19/17 1602     11/19/17 1800  ampicillin-sulbactam (UNASYN) 1.5 g in sodium chloride 0.9 % 100 mL IVPB     1.5 g 200 mL/hr over 30 Minutes Intravenous Every 6 hours 11/19/17 1606     11/19/17 1345  cefTRIAXone (ROCEPHIN) 1 g in sodium chloride 0.9 % 100 mL IVPB     1 g 200 mL/hr over 30 Minutes Intravenous  Once 11/19/17 1342 11/19/17 1510   11/19/17 1345  azithromycin (ZITHROMAX) tablet 500 mg     500 mg Oral  Once 11/19/17 1342 11/19/17 1440         HPI/Subjective: Patient complaining of wheezing and coughing  Objective: Vitals:   11/19/17 2218 11/20/17 0003 11/20/17 0410 11/20/17  0754  BP: 139/66  110/66   Pulse: 96  90 83  Resp: 20  18 18   Temp:   99.1 F (37.3 C)   TempSrc:   Oral   SpO2:  92% 94% 96%    Intake/Output Summary (Last 24 hours) at 11/20/2017 1009 Last data filed at 11/20/2017 0900 Gross per 24 hour  Intake 1776.8 ml  Output -  Net 1776.8 ml    Exam:  Examination:  General exam: Appears calm and comfortable  Respiratory  system: wheezing in both bases on auscultation. Respiratory effort normal. Cardiovascular system: S1 & S2 heard, RRR. No JVD, murmurs, rubs, gallops or clicks. No pedal edema. Gastrointestinal system: Abdomen is nondistended, soft and nontender. No organomegaly or masses felt. Normal bowel sounds heard. Central nervous system: Alert and oriented. No focal neurological deficits. Extremities: Symmetric 5 x 5 power. Skin: No rashes, lesions or ulcers Psychiatry: Judgement and insight appear normal. Mood & affect appropriate.     Data Reviewed: I have personally reviewed following labs and imaging studies  Micro Results Recent Results (from the past 240 hour(s))  C difficile quick scan w PCR reflex     Status: None   Collection Time: 11/19/17  5:39 PM  Result Value Ref Range Status   C Diff antigen NEGATIVE NEGATIVE Final   C Diff toxin NEGATIVE NEGATIVE Final   C Diff interpretation No C. difficile detected.  Final    Comment: Performed at [facility], [place] [address] [place], [place] [address]  Expectorated sputum assessment w rflx to resp cult     Status: None   Collection Time: 11/19/17 11:53 PM  Result Value Ref Range Status   Specimen Description EXPECTORATED SPUTUM  Final   Special Requests NONE  Final   Sputum evaluation   Final    Sputum specimen not acceptable for testing.  Please recollect.   RESULT CALLED TO, READ BACK BY AND VERIFIED WITH: A NEVIUS RN [phone] 11/20/17 A BROWNING Performed at [facility], [place] [address] [place], [place] [address]    Report Status 11/20/2017 FINAL  Final    Radiology Reports Dg Chest 2 View  Result Date: 11/19/2017 CLINICAL DATA:  Productive cough. EXAM: CHEST - 2 VIEW COMPARISON:  Two-view chest x-ray 10/04/2017. FINDINGS: The heart size is normal. Bilateral medial upper lobe airspace opacities are new since the prior exam, left greater than right. There is no edema or effusion. No other airspace consolidation is present.  Degenerative changes of the thoracic spine are stable. IMPRESSION: 1. New left greater than right upper lobe airspace disease is concerning for pneumonia. Electronically Signed   By: San Morelle M.D.   On: 11/19/2017 13:36     CBC Recent Labs  Lab 11/19/17 1239  WBC 7.6  HGB 13.4  HCT 41.4  PLT 146*  MCV 88.1  MCH 28.5  MCHC 32.4  RDW 12.7  LYMPHSABS 0.6*  MONOABS 0.7  EOSABS 0.0  BASOSABS 0.0    Chemistries  Recent Labs  Lab 11/19/17 1239  NA 136  K 4.2  CL 102  CO2 23  GLUCOSE 127*  BUN 26*  CREATININE 1.12*  CALCIUM 9.0   ------------------------------------------------------------------------------------------------------------------ estimated creatinine clearance is 43 mL/min (A) (by C-G formula based on SCr of 1.12 mg/dL (H)). ------------------------------------------------------------------------------------------------------------------ No results for input(s): HGBA1C in the last 72 hours. ------------------------------------------------------------------------------------------------------------------ No results for input(s): CHOL, HDL, LDLCALC, TRIG, CHOLHDL, LDLDIRECT in the last 72 hours. ------------------------------------------------------------------------------------------------------------------ No results for input(s): TSH, T4TOTAL,  T3FREE, THYROIDAB in the last 72 hours.  Invalid input(s): FREET3 ------------------------------------------------------------------------------------------------------------------ No results for input(s): VITAMINB12, FOLATE, FERRITIN, TIBC, IRON, RETICCTPCT in the last 72 hours.  Coagulation profile No results for input(s): INR, PROTIME in the last 168 hours.  No results for input(s): DDIMER in the last 72 hours.  Cardiac Enzymes Recent Labs  Lab 11/19/17 1847 11/20/17 0122 11/20/17 0553  TROPONINI 0.08* 0.10* 0.07*    ------------------------------------------------------------------------------------------------------------------ Invalid input(s): POCBNP   CBG: No results for input(s): GLUCAP in the last 168 hours.     Studies: Dg Chest 2 View  Result Date: 11/19/2017 CLINICAL DATA:  Productive cough. EXAM: CHEST - 2 VIEW COMPARISON:  Two-view chest x-ray 10/04/2017. FINDINGS: The heart size is normal. Bilateral medial upper lobe airspace opacities are new since the prior exam, left greater than right. There is no edema or effusion. No other airspace consolidation is present. Degenerative changes of the thoracic spine are stable. IMPRESSION: 1. New left greater than right upper lobe airspace disease is concerning for pneumonia. Electronically Signed   By: San Morelle M.D.   On: 11/19/2017 13:36      Lab Results  Component Value Date   HGBA1C 5.7 (H) 01/12/2011   Lab Results  Component Value Date   LDLCALC 52 05/17/2017   CREATININE 1.12 (H) 11/19/2017       Scheduled Meds: . aspirin EC  81 mg Oral Daily  . atorvastatin  20 mg Oral QHS  . calcium-vitamin D  1 tablet Oral Q breakfast  . cholecalciferol  1,000 Units Oral Daily  . enoxaparin (LOVENOX) injection  40 mg Subcutaneous Q24H  . feeding supplement (ENSURE ENLIVE)  237 mL Oral BID BM  . gabapentin  800 mg Oral TID  . hydroxychloroquine  200 mg Oral QHS  . magnesium oxide  400 mg Oral Daily  . metoprolol tartrate  25 mg Oral BID  . mometasone-formoterol  2 puff Inhalation BID  . montelukast  10 mg Oral QHS  . tamsulosin  0.4 mg Oral QPC supper   Continuous Infusions: . sodium chloride    . ampicillin-sulbactam (UNASYN) IV 1.5 g (11/20/17 0634)     LOS: 1 day    Time spent: >30 MINS    Reyne Dumas  [facility] Pager [phone]. If 7PM-7AM, please contact night-coverage at [email or web address], password [facility] 11/20/2017, 10:09 AM  LOS: 1 day

## 2017-11-20 NOTE — Progress Notes (Signed)
SLP Cancellation Note  Patient Details Name: [name] Long MRN: [id number] DOB: 1940/11/09   Cancelled treatment:       Reason Eval/Treat Not Completed: Patient at procedure or test/unavailable   Mehgan Santmyer, Selinda Orion 11/20/2017, 2:42 PM

## 2017-11-20 NOTE — Progress Notes (Signed)
While walking past room patient was having a coughing spell. Patient is sitting upright in bed. On 2L nasal canula with sp02 at 92 and tachycardiac with pulse at 115. She is having trouble catching her breath, but is not cyanotic.  Has a productive cough with a small amount of clear sputum. Encouraged to take deep breaths and continue to stay sitting up. Has expiratory wheezing. Will continue to keep a close eye on patient.

## 2017-11-20 NOTE — Progress Notes (Signed)
Paged MD re: pt's trop increasing since 0100. 0.06->0.08->0.10. Also not on tele monitoring.Awaiting response,.

## 2017-11-20 NOTE — Progress Notes (Signed)
Initial Nutrition Assessment  DOCUMENTATION CODES:   Not applicable  INTERVENTION:  Boost Breeze BID  Magic Cup dinner   NUTRITION DIAGNOSIS:   Inadequate oral intake related to nausea, poor appetite, acute illness as evidenced by per patient/family report, percent weight loss.   GOAL:   Patient will meet greater than or equal to 90% of their needs   MONITOR:   Supplement acceptance, PO intake  REASON FOR ASSESSMENT:   Malnutrition Screening Tool    ASSESSMENT:   Pt with PMH of asthma, CAD, degenerative disc disease and fibromyalgia. Pt admitted with SOB, coughing related to CAP vs aspiration.   Pt was alert and oriented when I entered the room. Husband was at bedside and participated in the conversation when needed.   Pt had eaten about 50% of breakfast and reported that she ate a couple of bites of lunch about 3/4 of her sandwich. Pt reported that she drinks the Ensures when they come, but they hurt her stomach and she doesn't like the flavor.   Pt reported eating when husband cooks breakfast for her which included egg, english muffin or toast. Through out the day pt reports snacking on wheat thins and cheese and 5 days out of the week she eats a protein bar with 20 grams protein. Pt mentions that she doesn't normally like to eat lunch, but when they do it is usually out to eat 5 times a week. Pt reports that she and her husband eat dinner every night of the week. Dinner normally includes chicken, mashed potatoes, green beans and a salad. Pt reports that she doesn't normally take any supplements at home.   Pt's states that her UBW is about 170# and reports that she lost 7# in the past week due to poor appetite and nausea with acute illness. Per the chart, pt has lost 7.5# since 9/30. 4% weight loss is not significant for the 1 month time frame.   Medications reviewed and include: Vitamin D3 1,000 units/d Magnesium Oxide 400 mg/d  Labs reviewed: Cardiac work up  ongoing Glucose 127 (H)  NUTRITION - FOCUSED PHYSICAL EXAM:    Most Recent Value  Orbital Region  No depletion  Upper Arm Region  Mild depletion  Thoracic and Lumbar Region  No depletion  Buccal Region  Mild depletion  Temple Region  Mild depletion  Clavicle Bone Region  No depletion  Clavicle and Acromion Bone Region  No depletion  Scapular Bone Region  No depletion  Dorsal Hand  Mild depletion  Patellar Region  No depletion  Anterior Thigh Region  No depletion  Posterior Calf Region  Moderate depletion  Edema (RD Assessment)  None  Hair  Reviewed  Eyes  Reviewed  Mouth  Reviewed  Skin  Reviewed [flakey]  Nails  Reviewed       Diet Order:   Diet Order            Diet Heart Room service appropriate? Yes; Fluid consistency: Thin  Diet effective now              EDUCATION NEEDS:   No education needs have been identified at this time  Skin:  Skin Assessment: Reviewed RN Assessment  Last BM:  11/18  Height:   Ht Readings from Last 1 Encounters:  07/08/17 5\' 4"  (1.626 m)    Weight:   Wt Readings from Last 1 Encounters:  11/19/17 77.3 kg    Ideal Body Weight:  54.5 kg  BMI:  29.25  Estimated Nutritional Needs:   Kcal:  1600-1800  Protein:  95-105 grams  Fluid:  >1.6L   Mauricia Area, MS, Dietetic Intern Pager: [phone] After hours Pager: [phone]

## 2017-11-21 DIAGNOSIS — K219 Gastro-esophageal reflux disease without esophagitis: Secondary | ICD-10-CM

## 2017-11-21 DIAGNOSIS — I10 Essential (primary) hypertension: Secondary | ICD-10-CM

## 2017-11-21 LAB — COMPREHENSIVE METABOLIC PANEL
ALBUMIN: 2.8 g/dL — AB (ref 3.5–5.0)
ALK PHOS: 60 U/L (ref 38–126)
ALT: 78 U/L — AB (ref 0–44)
AST: 95 U/L — ABNORMAL HIGH (ref 15–41)
Anion gap: 8 (ref 5–15)
BILIRUBIN TOTAL: 0.5 mg/dL (ref 0.3–1.2)
BUN: 22 mg/dL (ref 8–23)
CALCIUM: 8.5 mg/dL — AB (ref 8.9–10.3)
CO2: 25 mmol/L (ref 22–32)
CREATININE: 0.99 mg/dL (ref 0.44–1.00)
Chloride: 105 mmol/L (ref 98–111)
GFR calc Af Amer: >60 mL/min (ref >60)
GFR calc non Af Amer: 54 mL/min — ABNORMAL LOW (ref >60)
GLUCOSE: 100 mg/dL — AB (ref 70–99)
Potassium: 3.9 mmol/L (ref 3.5–5.1)
SODIUM: 138 mmol/L (ref 135–145)
Total Protein: 6.2 g/dL — ABNORMAL LOW (ref 6.5–8.1)

## 2017-11-21 LAB — CBC
HCT: 34.8 % — ABNORMAL LOW (ref 36.0–46.0)
Hemoglobin: 10.9 g/dL — ABNORMAL LOW (ref 12.0–15.0)
MCH: 27.9 pg (ref 26.0–34.0)
MCHC: 31.3 g/dL (ref 30.0–36.0)
MCV: 89 fL (ref 80.0–100.0)
PLATELETS: 171 10*3/uL (ref 150–400)
RBC: 3.91 MIL/uL (ref 3.87–5.11)
RDW: 13.2 % (ref 11.5–15.5)
WBC: 5.1 10*3/uL (ref 4.0–10.5)
nRBC: 0 % (ref 0.0–0.2)

## 2017-11-21 LAB — LEGIONELLA PNEUMOPHILA SEROGP 1 UR AG: L. pneumophila Serogp 1 Ur Ag: NEGATIVE

## 2017-11-21 MED ORDER — LOPERAMIDE HCL 2 MG PO CAPS
4.0000 mg | ORAL_CAPSULE | ORAL | Status: DC | PRN
  Administered 2017-11-21 (×2): 4 mg via ORAL
  Filled 2017-11-21 (×2): qty 2

## 2017-11-21 MED ORDER — IPRATROPIUM-ALBUTEROL 0.5-2.5 (3) MG/3ML IN SOLN
3.0000 mL | RESPIRATORY_TRACT | Status: DC
  Administered 2017-11-21 – 2017-11-22 (×10): 3 mL via RESPIRATORY_TRACT
  Filled 2017-11-21 (×10): qty 3

## 2017-11-21 MED ORDER — BENZONATATE 100 MG PO CAPS
200.0000 mg | ORAL_CAPSULE | Freq: Three times a day (TID) | ORAL | Status: DC | PRN
  Administered 2017-11-21: 200 mg via ORAL
  Filled 2017-11-21: qty 2

## 2017-11-21 MED ORDER — METHYLPREDNISOLONE SODIUM SUCC 125 MG IJ SOLR
60.0000 mg | Freq: Every day | INTRAMUSCULAR | Status: DC
  Administered 2017-11-21: 60 mg via INTRAVENOUS
  Filled 2017-11-21 (×2): qty 2

## 2017-11-21 MED ORDER — ALBUTEROL SULFATE (2.5 MG/3ML) 0.083% IN NEBU
2.5000 mg | INHALATION_SOLUTION | RESPIRATORY_TRACT | Status: DC | PRN
  Administered 2017-11-21 (×2): 2.5 mg via RESPIRATORY_TRACT
  Filled 2017-11-21 (×2): qty 3

## 2017-11-21 MED ORDER — DIPHENOXYLATE-ATROPINE 2.5-0.025 MG PO TABS: 2.0000 | ORAL_TABLET | Freq: Four times a day (QID) | ORAL | Status: DC | PRN

## 2017-11-21 NOTE — Progress Notes (Signed)
PROGRESS NOTE    [name] Long  ENI:[id number] DOB: Dec 25, 1940 DOA: 11/19/2017 PCP: Denita Lung, MD   Brief Narrative:  77 y.o.femalewith history h/oasthma, CAD, chronic back pain secondary to degenerative disc disease, arthritis, fibromyalgia who is on multiple pain medication/muscle relaxants presents with complaints of cough and shortness of breath for 3 days. Work-up in the ED revealed negative flu, WBC 7.6, troponin 0 0.06 and abnormal EKG. Chest x-ray showed bilateral upper lobe hazy infiltrates. admitted and treated for pneumonia.   Assessment & Plan:   Active Problems:   Pneumonia   Fibromyalgia   S/P CABG x 3: (lima-lad,svg-om,svg-rca)   CAD (coronary artery disease): multivessel, good LVF 50% at cath   Hyperlipidemia   GERD (gastroesophageal reflux disease)   Rheumatoid arthritis ([place])   Essential hypertension  Bilateral upper lobe infiltrates/acute hypoxic respiratory failure Treat the patient with IV Rocephin and Zithromax for community-acquired pneumonia transition to IV Unasyn to cover aspiration pneumonia. On exam today patient continues to have bilateral anterior and posterior both inspiratory and expiratory wheezing start the patient on scheduled duo nebs and add Solu-Medrol 60 mg daily.  Continue with nasal cannula oxygen as needed to keep sats greater than 90%.    Fibromyalgia Continue with home medications with decreased gabapentin dose  Diarrhea C. difficile PCR is negative.  Suspect probably from a antibiotic use.  PRN Imodium and symptomatic management at this time.    Coronary artery disease She currently denies any chest pain.  Last echocardiogram in 2018 shows preserved left ventricular ejection fraction.  Resume aspirin Lipitor and metoprolol.  On admission troponins were flat.  Please repeat EKG today    GERD stable   Hyperlipidemia resume Lipitor   DVT prophylaxis: Lovenox Code Status:  Full code.  Family Communication: none at  bedside.  Disposition Plan: pending resolution of diarrhea  Consultants:  None.   Procedures: none   Antimicrobials: Unasyn   Subjective:   Objective: Vitals:   11/20/17 2017 11/20/17 2023 11/21/17 0458 11/21/17 0841  BP: 120/71  120/78   Pulse: (!) 107  (!) 102   Resp: 14  18   Temp: 99.3 F (37.4 C)  98.7 F (37.1 C)   TempSrc: Oral  Oral   SpO2: 95% 96% 95% 90%    Intake/Output Summary (Last 24 hours) at 11/21/2017 0847 Last data filed at 11/21/2017 0051 Gross per 24 hour  Intake 580 ml  Output -  Net 580 ml   There were no vitals filed for this visit.  Examination:  General exam: Mild distress from wheezing and coughing Respiratory system: bilateral wheezing and  Rhonchi heard.  Cardiovascular system: S1 & S2 heard, RRR. No JVD, murmurs,No pedal edema. Gastrointestinal system: Abdomen is nondistended, soft and nontender. No organomegaly or masses felt. Normal bowel sounds heard. Central nervous system: Alert and oriented. No focal neurological deficits. Extremities: Symmetric 5 x 5 power. Skin: No rashes, lesions or ulcers Psychiatry: Anxious about the diarrhea    Data Reviewed: I have personally reviewed following labs and imaging studies  CBC: Recent Labs  Lab 11/19/17 1239 11/21/17 0254  WBC 7.6 5.1  NEUTROABS 6.3  --   HGB 13.4 10.9*  HCT 41.4 34.8*  MCV 88.1 89.0  PLT 146* 614   Basic Metabolic Panel: Recent Labs  Lab 11/19/17 1239 11/21/17 0254  NA 136 138  K 4.2 3.9  CL 102 105  CO2 23 25  GLUCOSE 127* 100*  BUN 26* 22  CREATININE 1.12* 0.99  CALCIUM 9.0 8.5*   GFR: Estimated Creatinine Clearance: 48.6 mL/min (by C-G formula based on SCr of 0.99 mg/dL). Liver Function Tests: Recent Labs  Lab 11/21/17 0254  AST 95*  ALT 78*  ALKPHOS 60  BILITOT 0.5  PROT 6.2*  ALBUMIN 2.8*   No results for input(s): LIPASE, AMYLASE in the last 168 hours. No results for input(s): AMMONIA in the last 168 hours. Coagulation Profile: No  results for input(s): INR, PROTIME in the last 168 hours. Cardiac Enzymes: Recent Labs  Lab 11/19/17 1847 11/20/17 0122 11/20/17 0553  TROPONINI 0.08* 0.10* 0.07*   BNP (last 3 results) No results for input(s): PROBNP in the last 8760 hours. HbA1C: No results for input(s): HGBA1C in the last 72 hours. CBG: No results for input(s): GLUCAP in the last 168 hours. Lipid Profile: No results for input(s): CHOL, HDL, LDLCALC, TRIG, CHOLHDL, LDLDIRECT in the last 72 hours. Thyroid Function Tests: No results for input(s): TSH, T4TOTAL, FREET4, T3FREE, THYROIDAB in the last 72 hours. Anemia Panel: No results for input(s): VITAMINB12, FOLATE, FERRITIN, TIBC, IRON, RETICCTPCT in the last 72 hours. Sepsis Labs: Recent Labs  Lab 11/19/17 1252  LATICACIDVEN 1.67    Recent Results (from the past 240 hour(s))  Culture, blood (routine x 2)     Status: None (Preliminary result)   Collection Time: 11/19/17  1:54 PM  Result Value Ref Range Status   Specimen Description BLOOD LEFT HAND  Final   Special Requests   Final    BOTTLES DRAWN AEROBIC ONLY Blood Culture adequate volume   Culture   Final    NO GROWTH < 24 HOURS Performed at [facility], [place] [address] [place], [place] [address]    Report Status PENDING  Incomplete  C difficile quick scan w PCR reflex     Status: None   Collection Time: 11/19/17  5:39 PM  Result Value Ref Range Status   C Diff antigen NEGATIVE NEGATIVE Final   C Diff toxin NEGATIVE NEGATIVE Final   C Diff interpretation No C. difficile detected.  Final    Comment: Performed at [facility], [place] [address] [place], [place] [address]  Respiratory Panel by PCR     Status: None   Collection Time: 11/19/17  5:41 PM  Result Value Ref Range Status   Adenovirus NOT DETECTED NOT DETECTED Final   Coronavirus 229E NOT DETECTED NOT DETECTED Final   Coronavirus HKU1 NOT DETECTED NOT DETECTED Final   Coronavirus NL63 NOT DETECTED NOT DETECTED Final    Coronavirus OC43 NOT DETECTED NOT DETECTED Final   Metapneumovirus NOT DETECTED NOT DETECTED Final   Rhinovirus / Enterovirus NOT DETECTED NOT DETECTED Final   Influenza A NOT DETECTED NOT DETECTED Final   Influenza B NOT DETECTED NOT DETECTED Final   Parainfluenza Virus 1 NOT DETECTED NOT DETECTED Final   Parainfluenza Virus 2 NOT DETECTED NOT DETECTED Final   Parainfluenza Virus 3 NOT DETECTED NOT DETECTED Final   Parainfluenza Virus 4 NOT DETECTED NOT DETECTED Final   Respiratory Syncytial Virus NOT DETECTED NOT DETECTED Final   Bordetella pertussis NOT DETECTED NOT DETECTED Final   Chlamydophila pneumoniae NOT DETECTED NOT DETECTED Final   Mycoplasma pneumoniae NOT DETECTED NOT DETECTED Final    Comment: Performed at [facility], [place] [address], [place], [place] [address]  Expectorated sputum assessment w rflx to resp cult     Status: None   Collection Time: 11/19/17 11:53 PM  Result Value Ref Range  Status   Specimen Description EXPECTORATED SPUTUM  Final   Special Requests NONE  Final   Sputum evaluation   Final    Sputum specimen not acceptable for testing.  Please recollect.   RESULT CALLED TO, READ BACK BY AND VERIFIED WITH: A NEVIUS RN [phone] 11/20/17 A BROWNING Performed at [facility], [place] [address], [place], [place] [address]    Report Status 11/20/2017 FINAL  Final         Radiology Studies: Dg Chest 2 View  Result Date: 11/19/2017 CLINICAL DATA:  Productive cough. EXAM: CHEST - 2 VIEW COMPARISON:  Two-view chest x-ray 10/04/2017. FINDINGS: The heart size is normal. Bilateral medial upper lobe airspace opacities are new since the prior exam, left greater than right. There is no edema or effusion. No other airspace consolidation is present. Degenerative changes of the thoracic spine are stable. IMPRESSION: 1. New left greater than right upper lobe airspace disease is concerning for pneumonia. Electronically Signed   By: San Morelle M.D.   On:  11/19/2017 13:36        Scheduled Meds: . aspirin EC  81 mg Oral Daily  . atorvastatin  20 mg Oral QHS  . calcium-vitamin D  1 tablet Oral Q breakfast  . cholecalciferol  1,000 Units Oral Daily  . enoxaparin (LOVENOX) injection  40 mg Subcutaneous Q24H  . feeding supplement  1 Container Oral BID BM  . gabapentin  800 mg Oral TID  . hydroxychloroquine  200 mg Oral QHS  . ipratropium-albuterol  3 mL Nebulization Q4H  . magnesium oxide  400 mg Oral Daily  . metoprolol tartrate  25 mg Oral BID  . mometasone-formoterol  2 puff Inhalation BID  . montelukast  10 mg Oral QHS  . tamsulosin  0.4 mg Oral QPC supper   Continuous Infusions: . sodium chloride    . ampicillin-sulbactam (UNASYN) IV 1.5 g (11/21/17 0618)     LOS: 2 days    Time spent: 35 minutes.    Hosie Poisson, MD [facility] Pager [phone]  If 7PM-7AM, please contact night-coverage [email or web address] Password [facility] 11/21/2017, 8:47 AM

## 2017-11-21 NOTE — Evaluation (Signed)
Clinical/Bedside Swallow Evaluation Patient Details  Name: [name] Long MRN: [id number] Date of Birth: 1940/04/01  Today's Date: 11/21/2017 Time: SLP Start Time (ACUTE ONLY): 1101 SLP Stop Time (ACUTE ONLY): 1115 SLP Time Calculation (min) (ACUTE ONLY): 14 min  Past Medical History:  Past Medical History:  Diagnosis Date  . Abnormal EKG 01/12/2011  . Allergy    RHINITIS  . Arthritis   . Asthma   . Asthma 01/12/2011  . Back pain, chronic--plans were for cervical disc surgery week of 01/16/11 01/12/2011  . Blood clot in vein 2004   L leg  . Blood transfusion without reported diagnosis 2004  . Chronic low back pain   . Complication of anesthesia   . Connective tissue disease ([place])   . Coronary artery disease   . Crescendo angina, with EKG changes, new. 01/12/2011  . Dyspnea   . Fibromyalgia   . Fibromyalgia   . Headache    migraine - several in a yr.   . History of stress test 04/2011   Normal stress test  . Hx of echocardiogram 07/19/2011   EF 50-55%. There is mild annular calcification, there is trace migtral regurgitation, there is trace tricupid regurgitation, pericardium is thick, there is no pericardial effusion.  . Hypertension   . Myocardial infarction [facility]) 2013   triple heart bypass  . Neuromuscular disorder (HCC)    fibromyalgia   . Osteoporosis    OSTEOPENIA  . Personal history of colonic polyp - adenoma 08/05/2013   08/2013 - 7 mm adenoma - consider repeat colonoscopy 2020 (age 21)  . Pneumonia    numerous times, only admitted one time for pneumonia but she has had pneumonia numerous times  . PONV (postoperative nausea and vomiting)   . PPD positive   . Status post coronary artery bypass grafting   . Urinary incontinence    post op, "sleepy bladder", urinar retention    Past Surgical History:  Past Surgical History:  Procedure Laterality Date  . ABDOMINAL HYSTERECTOMY  1981  . APPENDECTOMY  1946  . BLADDER REPAIR  1985  . BREAST SURGERY  1992   REDUCTION,  x3 surgeries overall on breast for augmentation   . CARDIAC CATHETERIZATION  2013  . CARPAL TUNNEL RELEASE Bilateral 1988, 1999  . [place]  . CORONARY ARTERY BYPASS GRAFT  01/14/2011   Procedure: CORONARY ARTERY BYPASS GRAFTING (CABG);  Surgeon: Tharon Aquas Adelene Idler, MD;  Location: [facility];  Service: Open Heart Surgery;  Laterality: N/A;  CABG x three, using right greater saphenous vein harvested endoscopically  . EYE SURGERY Bilateral 1985   radiokerototomy  . LEFT HEART CATHETERIZATION WITH CORONARY ANGIOGRAM N/A 01/13/2011   Procedure: LEFT HEART CATHETERIZATION WITH CORONARY ANGIOGRAM;  Surgeon: Troy Sine, MD;  Location: [facility] CATH LAB;  Service: Cardiovascular;  Laterality: N/A;  . PUBOVAGINAL SLING  2002  . STERNAL WOUND DEBRIDEMENT  07/27/2011   Procedure: STERNAL WOUND DEBRIDEMENT;  followed by wound vac Surgeon: Ivin Poot, MD;  Location: [place];  Service: Open Heart Surgery;  Laterality: N/A;  . TONSILLECTOMY  1952  . TOTAL HIP ARTHROPLASTY Right 08/22/2016   Procedure: TOTAL HIP ARTHROPLASTY ANTERIOR APPROACH;  Surgeon: Melrose Nakayama, MD;  Location: [facility];  Service: Orthopedics;  Laterality: Right;  . TRIGGER FINGER RELEASE Right 2014   4th finger  . TROCHANTERIC BURSA EXCISION Right    bursa removed, R hip  . TUBAL LIGATION     HPI:  [name]  GLORENE Long is a 77 y.o. female with history h/o asthma, CAD, chronic back pain secondary to degenerative disc disease, arthritis, fibromyalgia who is on multiple pain medication/muscle relaxants presents with complaints of cough and shortness of breath for 3 days. Chest x-ray showed bilateral upper lobe hazy infiltrates.     Assessment / Plan / Recommendation Clinical Impression  Pt presents wtih decreased risk of aspiration when following general aspiration precuations as pt consumed 3 oz thin liquids via straw with no overt s/s of aspiration. Pt continually sipped on thin liquids throughout. Education also provided  on sedating nature of medication with potential of aspiration when laying flat. Pt states that she sleeps upright at night at baseline. No further skilled ST services are indicated at this time. ST to sign off.  SLP Visit Diagnosis: Dysphagia, unspecified (R13.10)    Aspiration Risk  No limitations    Diet Recommendation Regular;Thin liquid   Liquid Administration via: Straw;Cup Medication Administration: Whole meds with liquid Supervision: Patient able to self feed Compensations: Minimize environmental distractions;Slow rate;Small sips/bites Postural Changes: Seated upright at 90 degrees;Remain upright for at least 30 minutes after po intake    Other  Recommendations Oral Care Recommendations: Oral care BID   Follow up Recommendations None       Swallow Study   General Date of Onset: 11/19/17 HPI: [name] Long is a 77 y.o. female with history h/o asthma, CAD, chronic back pain secondary to degenerative disc disease, arthritis, fibromyalgia who is on multiple pain medication/muscle relaxants presents with complaints of cough and shortness of breath for 3 days. Chest x-ray showed bilateral upper lobe hazy infiltrates.   Type of Study: Bedside Swallow Evaluation Previous Swallow Assessment: none in chart Diet Prior to this Study: Regular;Thin liquids Temperature Spikes Noted: No Respiratory Status: Nasal cannula History of Recent Intubation: No Behavior/Cognition: Alert;Cooperative;Pleasant mood Oral Cavity Assessment: Within Functional Limits Oral Care Completed by SLP: No Oral Cavity - Dentition: Adequate natural dentition Vision: Functional for self-feeding Self-Feeding Abilities: Able to feed self Patient Positioning: Upright in bed Baseline Vocal Quality: Normal Volitional Cough: Strong Volitional Swallow: Able to elicit    Oral/Motor/Sensory Function Overall Oral Motor/Sensory Function: Within functional limits   Ice Chips     Thin Liquid Thin Liquid: Within functional  limits Presentation: Self Fed;Straw    Nectar Thick Nectar Thick Liquid: Not tested   Honey Thick Honey Thick Liquid: Not tested   Puree Puree: Not tested   Solid     Solid: Within functional limits Presentation: Self Fed      Lakoda Raske 11/21/2017,11:17 AM

## 2017-11-21 NOTE — Progress Notes (Signed)
RT called regarding pt wanting breathing treatment. RT currently in a rapid on 9M, nurse aware. RT asked nurse to give treatment if pt is in distress.

## 2017-11-21 NOTE — Progress Notes (Signed)
Informed MD that pt is still having recurrent diarrhea despite Imodium. Awaiting response

## 2017-11-21 NOTE — Progress Notes (Signed)
RN states she was able to start IV for patient

## 2017-11-22 ENCOUNTER — Inpatient Hospital Stay (HOSPITAL_COMMUNITY): Payer: PPO

## 2017-11-22 LAB — BASIC METABOLIC PANEL
Anion gap: 8 (ref 5–15)
BUN: 22 mg/dL (ref 8–23)
CO2: 23 mmol/L (ref 22–32)
Calcium: 8.7 mg/dL — ABNORMAL LOW (ref 8.9–10.3)
Chloride: 107 mmol/L (ref 98–111)
Creatinine, Ser: 1.05 mg/dL — ABNORMAL HIGH (ref 0.44–1.00)
GFR calc non Af Amer: 50 mL/min — ABNORMAL LOW (ref >60)
GFR, EST AFRICAN AMERICAN: 58 mL/min — AB (ref >60)
Glucose, Bld: 150 mg/dL — ABNORMAL HIGH (ref 70–99)
Potassium: 3.6 mmol/L (ref 3.5–5.1)
Sodium: 138 mmol/L (ref 135–145)

## 2017-11-22 LAB — GASTROINTESTINAL PANEL BY PCR, STOOL (REPLACES STOOL CULTURE)

## 2017-11-22 LAB — CBC
HCT: 33.4 % — ABNORMAL LOW (ref 36.0–46.0)
Hemoglobin: 10.7 g/dL — ABNORMAL LOW (ref 12.0–15.0)
MCH: 28.2 pg (ref 26.0–34.0)
MCHC: 32 g/dL (ref 30.0–36.0)
MCV: 88.1 fL (ref 80.0–100.0)
Platelets: 183 10*3/uL (ref 150–400)
RBC: 3.79 MIL/uL — AB (ref 3.87–5.11)
RDW: 13 % (ref 11.5–15.5)
WBC: 4.7 10*3/uL (ref 4.0–10.5)
nRBC: 0 % (ref 0.0–0.2)

## 2017-11-22 MED ORDER — IPRATROPIUM-ALBUTEROL 0.5-2.5 (3) MG/3ML IN SOLN
3.0000 mL | Freq: Four times a day (QID) | RESPIRATORY_TRACT | Status: DC
  Administered 2017-11-23 (×2): 3 mL via RESPIRATORY_TRACT
  Filled 2017-11-22 (×2): qty 3

## 2017-11-22 MED ORDER — AMOXICILLIN-POT CLAVULANATE 875-125 MG PO TABS
1.0000 | ORAL_TABLET | Freq: Two times a day (BID) | ORAL | Status: DC
  Administered 2017-11-22 – 2017-11-23 (×3): 1 via ORAL
  Filled 2017-11-22 (×3): qty 1

## 2017-11-22 MED ORDER — IOHEXOL 300 MG/ML  SOLN
75.0000 mL | Freq: Once | INTRAMUSCULAR | Status: AC | PRN
  Administered 2017-11-22: 75 mL via INTRAVENOUS

## 2017-11-22 MED ORDER — METHYLPREDNISOLONE SODIUM SUCC 125 MG IJ SOLR
60.0000 mg | Freq: Two times a day (BID) | INTRAMUSCULAR | Status: DC
  Administered 2017-11-22 – 2017-11-23 (×3): 60 mg via INTRAVENOUS
  Filled 2017-11-22 (×2): qty 2

## 2017-11-22 NOTE — Progress Notes (Signed)
Left [name] Long message patient inpatient at [facility] since 11-19-17 for pneumonia.

## 2017-11-22 NOTE — Progress Notes (Signed)
PROGRESS NOTE    [name] Long  KNL:[id number] DOB: [birth year] DOA: 11/19/2017 PCP: Denita Lung, MD   Brief Narrative:  77 y.o.femalewith history h/oasthma, CAD, chronic back pain secondary to degenerative disc disease, arthritis, fibromyalgia who is on multiple pain medication/muscle relaxants presents with complaints of cough and shortness of breath for 3 days. Work-up in the ED revealed negative flu, WBC 7.6, troponin 0 0.06 and abnormal EKG. Chest x-ray showed bilateral upper lobe hazy infiltrates. admitted and treated for pneumonia.   Assessment & Plan:   Active Problems:   Pneumonia   Fibromyalgia   S/P CABG x 3: (lima-lad,svg-om,svg-rca)   CAD (coronary artery disease): multivessel, good LVF 50% at cath   Hyperlipidemia   GERD (gastroesophageal reflux disease)   Rheumatoid arthritis (Impact)   Essential hypertension  Bilateral upper lobe infiltrates/acute hypoxic respiratory failure Treat the patient with IV Rocephin and Zithromax for community-acquired pneumonia transition to IV Unasyn to cover aspiration pneumonia and to augmentin as she is able to take oral meds.  In view of her diffuse wheezing she was started on IV steroids and CT chest with contrast ordered for further evaluation.  Continue with the same management for another 24hours and re evaluate. .    Fibromyalgia Continue with home medications with decreased gabapentin dose  Diarrhea C. difficile PCR is negative.  Suspect probably from a antibiotic use.  Gi pathogen pcr is pending.  Symptomatic management .     Coronary artery disease She currently denies any chest pain.  Last echocardiogram in 2018 shows preserved left ventricular ejection fraction.  Resume aspirin Lipitor and metoprolol.  On admission troponins were flat.  Please repeat EKG done and personally reviewed  shows diffuse st t wave changes unchanged from May 2019 .     GERD stable   Hyperlipidemia resume Lipitor   DVT  prophylaxis: Lovenox Code Status:  Full code.  Family Communication: none at bedside.  Disposition Plan: pending resolution of diarrhea and improvement with respiratory status.   Consultants:  None.   Procedures: none   Antimicrobials: Unasyn to augmentin.  Subjective: Reports had one more loose BM earlier this am. Continues to have cough and wheezing, but better than yesterday.  Unable to wean her off the oxygen .   Objective: Vitals:   11/22/17 0613 11/22/17 0804 11/22/17 1128 11/22/17 1341  BP: 135/78   134/75  Pulse: 83   96  Resp: 18   18  Temp: 98.2 F (36.8 C)   98.2 F (36.8 C)  TempSrc: Oral   Oral  SpO2: 99% 93% 94%     Intake/Output Summary (Last 24 hours) at 11/22/2017 1513 Last data filed at 11/22/2017 1300 Gross per 24 hour  Intake 480 ml  Output 800 ml  Net -320 ml   There were no vitals filed for this visit.  Examination:  General exam: Mild distress from wheezing and coughing Respiratory system: bilateral wheezing and  Rhonchi heard both anteriorly and posteriorly.  Cardiovascular system: S1 & S2 heard, RRR. No JVD, murmurs, No pedal edema. Gastrointestinal system: Abdomen is soft non tender non distended bowel sounds good.  Central nervous system: Alert and oriented.non focal.  Extremities: Symmetric 5 x 5 power. Skin: No rashes, lesions or ulcers Psychiatry: Anxious     Data Reviewed: I have personally reviewed following labs and imaging studies  CBC: Recent Labs  Lab 11/19/17 1239 11/21/17 0254 11/22/17 0140  WBC 7.6 5.1 4.7  NEUTROABS 6.3  --   --  HGB 13.4 10.9* 10.7*  HCT 41.4 34.8* 33.4*  MCV 88.1 89.0 88.1  PLT 146* 171 710   Basic Metabolic Panel: Recent Labs  Lab 11/19/17 1239 11/21/17 0254 11/22/17 0140  NA 136 138 138  K 4.2 3.9 3.6  CL 102 105 107  CO2 23 25 23   GLUCOSE 127* 100* 150*  BUN 26* 22 22  CREATININE 1.12* 0.99 1.05*  CALCIUM 9.0 8.5* 8.7*   GFR: Estimated Creatinine Clearance: 45.8 mL/min (A)  (by C-G formula based on SCr of 1.05 mg/dL (H)). Liver Function Tests: Recent Labs  Lab 11/21/17 0254  AST 95*  ALT 78*  ALKPHOS 60  BILITOT 0.5  PROT 6.2*  ALBUMIN 2.8*   No results for input(s): LIPASE, AMYLASE in the last 168 hours. No results for input(s): AMMONIA in the last 168 hours. Coagulation Profile: No results for input(s): INR, PROTIME in the last 168 hours. Cardiac Enzymes: Recent Labs  Lab 11/19/17 1847 11/20/17 0122 11/20/17 0553  TROPONINI 0.08* 0.10* 0.07*   BNP (last 3 results) No results for input(s): PROBNP in the last 8760 hours. HbA1C: No results for input(s): HGBA1C in the last 72 hours. CBG: No results for input(s): GLUCAP in the last 168 hours. Lipid Profile: No results for input(s): CHOL, HDL, LDLCALC, TRIG, CHOLHDL, LDLDIRECT in the last 72 hours. Thyroid Function Tests: No results for input(s): TSH, T4TOTAL, FREET4, T3FREE, THYROIDAB in the last 72 hours. Anemia Panel: No results for input(s): VITAMINB12, FOLATE, FERRITIN, TIBC, IRON, RETICCTPCT in the last 72 hours. Sepsis Labs: Recent Labs  Lab 11/19/17 1252  LATICACIDVEN 1.67    Recent Results (from the past 240 hour(s))  Culture, blood (routine x 2)     Status: None (Preliminary result)   Collection Time: 11/19/17  1:54 PM  Result Value Ref Range Status   Specimen Description BLOOD RIGHT HAND  Final   Special Requests   Final    BOTTLES DRAWN AEROBIC ONLY Blood Culture adequate volume   Culture   Final    NO GROWTH 3 DAYS Performed at [facility], [place] [address] [place], [place] [address]    Report Status PENDING  Incomplete  Culture, blood (routine x 2)     Status: None (Preliminary result)   Collection Time: 11/19/17  1:54 PM  Result Value Ref Range Status   Specimen Description BLOOD LEFT HAND  Final   Special Requests   Final    BOTTLES DRAWN AEROBIC ONLY Blood Culture adequate volume   Culture   Final    NO GROWTH 3 DAYS Performed at [facility],  [place] [address], [place], [place] [address]    Report Status PENDING  Incomplete  C difficile quick scan w PCR reflex     Status: None   Collection Time: 11/19/17  5:39 PM  Result Value Ref Range Status   C Diff antigen NEGATIVE NEGATIVE Final   C Diff toxin NEGATIVE NEGATIVE Final   C Diff interpretation No C. difficile detected.  Final    Comment: Performed at [facility], [place] [address] [place], [place] [address]  Stool culture (children & immunocomp patients)     Status: None (Preliminary result)   Collection Time: 11/19/17  5:39 PM  Result Value Ref Range Status   Salmonella/Shigella Screen PENDING  Incomplete   Campylobacter Culture PENDING  Incomplete   E coli, Shiga toxin Assay Negative Negative Final    Comment: (NOTE) Performed At: [place] [address]  [address] [place], [place] [id number] Rush Farmer MD NI:[id number]   Respiratory Panel by PCR     Status: None   Collection Time: 11/19/17  5:41 PM  Result Value Ref Range Status   Adenovirus NOT DETECTED NOT DETECTED Final   Coronavirus 229E NOT DETECTED NOT DETECTED Final   Coronavirus HKU1 NOT DETECTED NOT DETECTED Final   Coronavirus NL63 NOT DETECTED NOT DETECTED Final   Coronavirus OC43 NOT DETECTED NOT DETECTED Final   Metapneumovirus NOT DETECTED NOT DETECTED Final   Rhinovirus / Enterovirus NOT DETECTED NOT DETECTED Final   Influenza A NOT DETECTED NOT DETECTED Final   Influenza B NOT DETECTED NOT DETECTED Final   Parainfluenza Virus 1 NOT DETECTED NOT DETECTED Final   Parainfluenza Virus 2 NOT DETECTED NOT DETECTED Final   Parainfluenza Virus 3 NOT DETECTED NOT DETECTED Final   Parainfluenza Virus 4 NOT DETECTED NOT DETECTED Final   Respiratory Syncytial Virus NOT DETECTED NOT DETECTED Final   Bordetella pertussis NOT DETECTED NOT DETECTED Final   Chlamydophila pneumoniae NOT DETECTED NOT DETECTED Final   Mycoplasma pneumoniae NOT DETECTED NOT DETECTED Final    Comment: Performed at [facility], [place] [address], [place], [place] [address]  Expectorated sputum assessment w rflx to resp cult     Status: None   Collection Time: 11/19/17 11:53 PM  Result Value Ref Range Status   Specimen Description EXPECTORATED SPUTUM  Final   Special Requests NONE  Final   Sputum evaluation   Final    Sputum specimen not acceptable for testing.  Please recollect.   RESULT CALLED TO, READ BACK BY AND VERIFIED WITH: A NEVIUS RN [phone] 11/20/17 A BROWNING Performed at [facility], [place] [address] [place], [place] [address]    Report Status 11/20/2017 FINAL  Final         Radiology Studies: No results found.      Scheduled Meds: . aspirin EC  81 mg Oral Daily  . atorvastatin  20 mg Oral QHS  . calcium-vitamin D  1 tablet Oral Q breakfast  . cholecalciferol  1,000 Units Oral Daily  . enoxaparin (LOVENOX) injection  40 mg Subcutaneous Q24H  . feeding supplement  1 Container Oral BID BM  . gabapentin  800 mg Oral TID  . hydroxychloroquine  200 mg Oral QHS  . ipratropium-albuterol  3 mL Nebulization Q4H  . methylPREDNISolone (SOLU-MEDROL) injection  60 mg Intravenous Q12H  . metoprolol tartrate  25 mg Oral BID  . mometasone-formoterol  2 puff Inhalation BID  . montelukast  10 mg Oral QHS  . tamsulosin  0.4 mg Oral QPC supper   Continuous Infusions: . sodium chloride       LOS: 3 days    Time spent: 35 minutes.    Hosie Poisson, MD [facility] Pager [phone]  If 7PM-7AM, please contact night-coverage [email or web address] Password [facility] 11/22/2017, 3:13 PM

## 2017-11-23 ENCOUNTER — Inpatient Hospital Stay (HOSPITAL_COMMUNITY)
Admission: RE | Admit: 2017-11-23 | Discharge: 2017-11-23 | Disposition: A | Discharge status: Home / Self Care | Payer: PPO | Source: Ambulatory Visit

## 2017-11-23 MED ORDER — GUAIFENESIN-DM 100-10 MG/5ML PO SYRP: 5.0000 mL | ORAL_SOLUTION | ORAL | 0 refills | Status: DC | PRN

## 2017-11-23 MED ORDER — AMOXICILLIN-POT CLAVULANATE 875-125 MG PO TABS: 1.0000 | ORAL_TABLET | Freq: Two times a day (BID) | ORAL | 0 refills | Status: DC

## 2017-11-23 MED ORDER — IPRATROPIUM-ALBUTEROL 0.5-2.5 (3) MG/3ML IN SOLN: 3.0000 mL | Freq: Four times a day (QID) | RESPIRATORY_TRACT | 2 refills | Status: DC | PRN

## 2017-11-23 MED ORDER — BENZONATATE 200 MG PO CAPS: 200.0000 mg | ORAL_CAPSULE | Freq: Three times a day (TID) | ORAL | 0 refills | Status: DC | PRN

## 2017-11-23 MED ORDER — PREDNISONE 20 MG PO TABS: ORAL_TABLET | ORAL | 0 refills | Status: DC

## 2017-11-23 MED ORDER — DIPHENOXYLATE-ATROPINE 2.5-0.025 MG PO TABS: 2.0000 | ORAL_TABLET | Freq: Four times a day (QID) | ORAL | 0 refills | Status: DC | PRN

## 2017-11-23 NOTE — Plan of Care (Signed)

## 2017-11-23 NOTE — Care Management Note (Addendum)
Case Management Note  Patient Details  Name: [name] Long MRN: [id number] Date of Birth: [birth year]  Subjective/Objective:    77 yr old female admitted with pneumonia.                Action/Plan:  Patient will need nebulizers at discharge, referral called to Neoma Laming, [facility] Liaison.    Expected Discharge Date:  11/23/17               Expected Discharge Plan:  [place]  In-House Referral:  NA  Discharge planning Services  CM Consult  Post Acute Care Choice:  Home Health, Durable Medical Equipment Choice offered to:  Patient  DME Arranged:  Nebulizer/meds DME Agency:  [place]:  NA [place] Agency: NA Status of Service:  Completed, signed off  If discussed at [place] of Stay Meetings, dates discussed:    Additional Comments:  [name] Meeker, RN 11/23/2017, 12:24 PM

## 2017-11-23 NOTE — Plan of Care (Signed)
  Problem: Education: Goal: Knowledge of General Education information will improve Description Including pain rating scale, medication(s)/side effects and non-pharmacologic comfort measures Outcome: Progressing   Problem: Health Behavior/Discharge Planning: Goal: Ability to manage health-related needs will improve Outcome: Progressing   Problem: Clinical Measurements: Goal: Ability to maintain clinical measurements within normal limits will improve Outcome: Progressing Goal: Will remain free from infection Outcome: Progressing   Problem: Nutrition: Goal: Adequate nutrition will be maintained Outcome: Progressing   Problem: Elimination: Goal: Will not experience complications related to bowel motility Outcome: Progressing   Problem: Pain Managment: Goal: General experience of comfort will improve Outcome: Progressing   Problem: Safety: Goal: Ability to remain free from injury will improve Outcome: Progressing   

## 2017-11-23 NOTE — Consult Note (Signed)
   [facility] CM Inpatient Consult   11/23/2017  [name] Long 12/06/1940 [id number]  Patient screened for potential [place] Management services to assess for post hospital follow up needs. Patient is in [place] as well.  Her primary care provider is Dr. Redmond School.  Patient had been out reach in the past by a [facility] RN Care Manager Coordinator and [facility] Pharmacist.       Met with the patient regarding the benefits of [facility] Management services.  Patient denies any medication needs, she denies issues with post hospital follow up needs.  States, "I don't need anything except the respiratory therapist to come in here before I go home."  Explained that [facility] [facility] is a covered benefit of insurance and primary care provider. Reviewed information for [facility] Care Management and a folder was provided with contact information. Patient declined a brochure and magnet states, "I already have that."  Explained that [facility] does not interfere with or replace any services arranged by the inpatient care management staff.  Patient declined services with [facility].  Natividad Brood, RN BSN [facility] Liaison  [phone] business mobile phone Toll free office [phone]

## 2017-11-24 LAB — CULTURE, BLOOD (ROUTINE X 2)
CULTURE: NO GROWTH
Culture: NO GROWTH
SPECIAL REQUESTS: ADEQUATE
Special Requests: ADEQUATE

## 2017-11-24 LAB — STOOL CULTURE REFLEX - RSASHR

## 2017-11-24 LAB — STOOL CULTURE: E coli, Shiga toxin Assay: NEGATIVE

## 2017-11-24 LAB — STOOL CULTURE REFLEX - CMPCXR

## 2017-11-27 ENCOUNTER — Encounter (HOSPITAL_COMMUNITY)
Admission: RE | Admit: 2017-11-27 | Discharge: 2017-11-27 | Disposition: A | Discharge status: Home / Self Care | Payer: PPO | Source: Ambulatory Visit | Attending: Urology | Admitting: Urology

## 2017-11-27 ENCOUNTER — Ambulatory Visit (HOSPITAL_COMMUNITY)
Admission: RE | Admit: 2017-11-27 | Discharge: 2017-11-27 | Disposition: A | Discharge status: Home / Self Care | Payer: PPO | Source: Ambulatory Visit | Attending: Anesthesiology | Admitting: Anesthesiology

## 2017-11-27 ENCOUNTER — Encounter (HOSPITAL_COMMUNITY): Payer: Self-pay | Admitting: *Deleted

## 2017-11-27 ENCOUNTER — Encounter: Payer: Self-pay | Admitting: Cardiovascular Disease

## 2017-11-27 ENCOUNTER — Other Ambulatory Visit: Payer: Self-pay

## 2017-11-27 ENCOUNTER — Ambulatory Visit (INDEPENDENT_AMBULATORY_CARE_PROVIDER_SITE_OTHER): Payer: PPO | Admitting: Cardiovascular Disease

## 2017-11-27 DIAGNOSIS — Z01818 Encounter for other preprocedural examination: Secondary | ICD-10-CM | POA: Insufficient documentation

## 2017-11-27 DIAGNOSIS — Z951 Presence of aortocoronary bypass graft: Secondary | ICD-10-CM

## 2017-11-27 DIAGNOSIS — E7800 Pure hypercholesterolemia, unspecified: Secondary | ICD-10-CM | POA: Diagnosis not present

## 2017-11-27 LAB — CBC
HCT: 45.7 % (ref 36.0–46.0)
Hemoglobin: 14.2 g/dL (ref 12.0–15.0)
MCH: 28 pg (ref 26.0–34.0)
MCHC: 31.1 g/dL (ref 30.0–36.0)
MCV: 90 fL (ref 80.0–100.0)
PLATELETS: 394 10*3/uL (ref 150–400)
RBC: 5.08 MIL/uL (ref 3.87–5.11)
RDW: 13.1 % (ref 11.5–15.5)
WBC: 9.9 10*3/uL (ref 4.0–10.5)
nRBC: 0 % (ref 0.0–0.2)

## 2017-11-27 LAB — BASIC METABOLIC PANEL
ANION GAP: 10 (ref 5–15)
BUN: 22 mg/dL (ref 8–23)
CALCIUM: 8.8 mg/dL — AB (ref 8.9–10.3)
CHLORIDE: 104 mmol/L (ref 98–111)
CO2: 28 mmol/L (ref 22–32)
CREATININE: 1.04 mg/dL — AB (ref 0.44–1.00)
GFR, EST NON AFRICAN AMERICAN: 52 mL/min — AB (ref >60)
GLUCOSE: 136 mg/dL — AB (ref 70–99)
POTASSIUM: 3.7 mmol/L (ref 3.5–5.1)
SODIUM: 142 mmol/L (ref 135–145)

## 2017-11-27 NOTE — Progress Notes (Signed)
Patient showed up in Admitting today stating she needed to be seen today for preop appt for upcoming surgery on 12/07/2017.  Patient states " I am going out of town tomorrow.  " Patient was in hospital last week with pneumonia.  Discharged on 11/23/2017.  Patient seen by cardiologist today per patient ( Dr Gwenlyn Found ) note in epic and patient states " Dr Gwenlyn Found says I am good to go for surgery." Read note in epic and did not see clearance.  Patient was asked had she been seen by anybody to clear her regarding the pneumonia for surgery.  No one has seen her to follow up the pneumonia admission .  Saw patient for preop and preop labs and cxr completed.  Called office of [place] ( scheduler for [facility]) andleft voice mail message with above.   Asked for call back .  Informed on message that I would call back with CXR results and any abnormal labs .  Also stated in message that I would need the clearance from Dr Gwenlyn Found and any other clearanes.

## 2017-11-27 NOTE — Assessment & Plan Note (Signed)
History of CAD status post CABG by Dr. Dahlia Byes after cath 01/12/2011 in the setting of chest pain showed left main/three-vessel disease.  She had a LIMA to LAD, vein to Drs. Smiley Houseman branch into the right coronary artery.  Her postop course was uncomplicated.  She did have a Myoview stress test performed 12/28/2015 which was low risk nonischemic and a recent 2D echo performed 11/20/2017 which was essentially normal except for grade 2 diastolic dysfunction and mild LVH.

## 2017-11-27 NOTE — Psychosocial Assessment (Signed)
[name] Long called back from [facility] and mae her aware that CXR result is still pending from today.  CBC and BMP- ok.  Informed [name] Long that I will let her know in the am of CXR results and I will let anesthesia be aware of patient history and status and let her be aware.  [name] Long stated she would inform Dr [name] via text message.

## 2017-11-27 NOTE — Progress Notes (Signed)
EKG-11/21/17-epic  ECHO-11/20/17-echo-epic

## 2017-11-27 NOTE — Progress Notes (Signed)
11/27/2017 [name] Long   May 17, 1940  [id number]  Primary Physician Denita Lung, MD Primary Cardiologist: Lorretta Harp MD FACP, [place], [place], [place]  HPI:  [name] Long is a 77 y.o.  mildly overweight married Caucasian female, mother of 2, grandmother to 6 grandchildren, who I last saw in the office  05/15/2017.Marland Kitchen She ended up being admitted January 12, 2011, with chest pain and was catheterized, revealing a left main 3-vessel disease with an EF of 50% and high anterior hypokinesia, for which she underwent coronary artery bypass grafting by Dr. Tharon Aquas Trigt with a LIMA to her LAD, vein to an obtuse marginal branch and to the right coronary artery. Postop course was uncomplicated. A Myoview in April was normal. Her lipid profile was excellent. She saw Cecilie Kicks in our office on July 15 complaining of chest pain, and the concern was musculoskeletal versus pericarditis. A 2D echocardiogram was entirely normal. She ended up seeing Dr. Prescott Gum, who diagnosed a staph sternal wound infection, requiring hospitalization and debridement. She had a wound VAC on and fortunately has completely healed.Since I saw her in the office a year and a half ago she was complaining of some increased dyspnea on exertion and decreased exercise tolerance.  A Myoview stress test was normal as was a 2D echocardiogram.  It turns out that her increasing shortness of breath is probably related to exacerbation of her asthma at that time.  Since I saw her 6 months ago she has been diagnosed with renal cell carcinoma and is scheduled to undergo laparoscopic partial nephrectomy by Dr. Gilford Rile at [facility] urology.  She was also recently hospitalized for bilateral pneumonia for a week and recently was discharged.  She is slowly improving.  Current Meds  Medication Sig  . ADVAIR DISKUS 500-50 MCG/DOSE AEPB INHALE ONE PUFF BY MOUTH INTO THE LUNGS TWICE A DAY (Patient taking differently: Inhale 1 puff into the lungs 2 (two)  times daily. )  . albuterol (PROVENTIL HFA) 108 (90 Base) MCG/ACT inhaler INHALE 2 PUFF AS DIRECTED FOUR TIMES A DAY AS NEEDED (Patient taking differently: Inhale 2 puffs into the lungs every 6 (six) hours as needed for wheezing or shortness of breath. )  . aspirin EC 81 MG tablet Take 81 mg by mouth daily.  Marland Kitchen atorvastatin (LIPITOR) 20 MG tablet TAKE ONE TABLET BY MOUTH DAILY **MUST CALL MD FOR APPOINTMENT (Patient taking differently: Take 20 mg by mouth at bedtime. )  . butalbital-acetaminophen-caffeine (FIORICET, ESGIC) 50-325-40 MG tablet Take 1 tablet by mouth 2 (two) times daily as needed for headache.   . Calcium Carbonate-Vitamin D (CALCIUM-D PO) Take 1 tablet by mouth daily.  . carisoprodol (SOMA) 350 MG tablet Take 350 mg by mouth daily as needed (arthritis pain).   . cholecalciferol (VITAMIN D3) 25 MCG (1000 UT) tablet Take 1,000 Units by mouth daily.  Marland Kitchen gabapentin (NEURONTIN) 600 MG tablet Take 1,200-1,800 mg by mouth See admin instructions. Take 2 tablets (1200 mg) by mouth every morning and 3 tablets (1800 mg) at bedtime  . guaiFENesin-dextromethorphan (ROBITUSSIN DM) 100-10 MG/5ML syrup Take 5 mLs by mouth every 4 (four) hours as needed for cough.  Marland Kitchen HYDROmorphone (DILAUDID) 4 MG tablet Take 4 mg by mouth 2 (two) times daily as needed for severe pain.   . hydroxychloroquine (PLAQUENIL) 200 MG tablet Take 200 mg by mouth at bedtime.   . metoprolol tartrate (LOPRESSOR) 25 MG tablet TAKE ONE TABLET BY MOUTH TWICE A DAY (Patient  taking differently: Take 25 mg by mouth 2 (two) times daily. )  . montelukast (SINGULAIR) 10 MG tablet TAKE ONE TABLET BY MOUTH DAILY (Patient taking differently: Take 10 mg by mouth at bedtime. )  . Polyethyl Glycol-Propyl Glycol (SYSTANE OP) Apply 1 drop to eye daily as needed (dry eyes).   . predniSONE (DELTASONE) 20 MG tablet Prednisone 60 mg daily for 2 days followed by  Prednisone 40 mg daily for 3 days followed by Prednisone 20 mg daily for 3 days  .  silodosin (RAPAFLO) 8 MG CAPS capsule Take 8 mg by mouth daily with breakfast.     Allergies  Allergen Reactions  . Dimenhydrinate Other (See Comments)    "locks up kidneys"  . Morphine And Related Nausea And Vomiting  . Percocet [Oxycodone-Acetaminophen] Other (See Comments)    hallucinations  . Zolpidem Other (See Comments)    Unusual behavior    Social History   Socioeconomic History  . Marital status: Married    Spouse name: Not on file  . Number of children: Not on file  . Years of education: Not on file  . Highest education level: Not on file  Occupational History  . Not on file  Social Needs  . Financial resource strain: Not on file  . Food insecurity:    Worry: Not on file    Inability: Not on file  . Transportation needs:    Medical: Not on file    Non-medical: Not on file  Tobacco Use  . Smoking status: Former Smoker    Last attempt to quit: 01/12/1980    Years since quitting: 37.9  . Smokeless tobacco: Never Used  Substance and Sexual Activity  . Alcohol use: No    Alcohol/week: 0.0 standard drinks  . Drug use: No  . Sexual activity: Yes  Lifestyle  . Physical activity:    Days per week: Not on file    Minutes per session: Not on file  . Stress: Not on file  Relationships  . Social connections:    Talks on phone: Not on file    Gets together: Not on file    Attends religious service: Not on file    Active member of club or organization: Not on file    Attends meetings of clubs or organizations: Not on file    Relationship status: Not on file  . Intimate partner violence:    Fear of current or ex partner: Not on file    Emotionally abused: Not on file    Physically abused: Not on file    Forced sexual activity: Not on file  Other Topics Concern  . Not on file  Social History Narrative  . Not on file     Review of Systems: General: negative for chills, fever, night sweats or weight changes.  Cardiovascular: negative for chest pain, dyspnea  on exertion, edema, orthopnea, palpitations, paroxysmal nocturnal dyspnea or shortness of breath Dermatological: negative for rash Respiratory: negative for cough or wheezing Urologic: negative for hematuria Abdominal: negative for nausea, vomiting, diarrhea, bright red blood per rectum, melena, or hematemesis Neurologic: negative for visual changes, syncope, or dizziness All other systems reviewed and are otherwise negative except as noted above.    Blood pressure (!) 159/90, pulse 66, height 5' 4.5" (1.638 m), weight 173 lb 3.2 oz (78.6 kg), SpO2 94 %.  General appearance: alert and no distress Neck: no adenopathy, no carotid bruit, no JVD, supple, symmetrical, trachea midline and thyroid not enlarged, symmetric, no  tenderness/mass/nodules Lungs: clear to auscultation bilaterally Heart: regular rate and rhythm, S1, S2 normal, no murmur, click, rub or gallop Extremities: extremities normal, atraumatic, no cyanosis or edema Pulses: 2+ and symmetric Skin: Skin color, texture, turgor normal. No rashes or lesions Neurologic: Alert and oriented X 3, normal strength and tone. Normal symmetric reflexes. Normal coordination and gait  EKG not performed today  ASSESSMENT AND PLAN:   S/P CABG x 3: (lima-lad,svg-om,svg-rca) History of CAD status post CABG by Dr. Dahlia Byes after cath 01/12/2011 in the setting of chest pain showed left main/three-vessel disease.  She had a LIMA to LAD, vein to Drs. Smiley Houseman branch into the right coronary artery.  Her postop course was uncomplicated.  She did have a Myoview stress test performed 12/28/2015 which was low risk nonischemic and a recent 2D echo performed 11/20/2017 which was essentially normal except for grade 2 diastolic dysfunction and mild LVH.  Hyperlipidemia History of hyperlipidemia on statin therapy      Lorretta Harp MD [facility], [facility] 11/27/2017 11:58 AM

## 2017-11-27 NOTE — Patient Instructions (Addendum)
[name] Long  11/27/2017   Your procedure is scheduled on: 12/07/2017   Report to [facility] Main  Entrance  Report to admitting at     0930 AM    Call this number if you have problems the morning of surgery [phone]   Remember: Do not eat food or drink liquids :After Midnight. BRUSH YOUR TEETH MORNING OF SURGERY AND RINSE YOUR MOUTH OUT, NO CHEWING GUM CANDY OR MINTS.     Take these medicines the morning of surgery with A SIP OF WATER: Rap[place]flo, Metoprolol, Inhalers as usual and bring  DO NOT TAKE ANY DIABETIC MEDICATIONS DAY OF YOUR SURGERY                               You may not have any metal on your body including hair pins and              piercings  Do not wear jewelry, make-up, lotions, powders or perfumes, deodorant             Do not wear nail polish.  Do not shave  48 hours prior to surgery.     Do not bring valuables to the hospital. [place].  Contacts, dentures or bridgework may not be worn into surgery.  Leave suitcase in the car. After surgery it may be brought to your room.                    Please read over the following fact sheets you were given: _____________________________________________________________________             [facility] - Preparing for Surgery Before surgery, you can play an important role.  Because skin is not sterile, your skin needs to be as free of germs as possible.  You can reduce the number of germs on your skin by washing with CHG (chlorahexidine gluconate) soap before surgery.  CHG is an antiseptic cleaner which kills germs and bonds with the skin to continue killing germs even after washing. Please DO NOT use if you have an allergy to CHG or antibacterial soaps.  If your skin becomes reddened/irritated stop using the CHG and inform your nurse when you arrive at Short Stay. Do not shave (including legs and underarms) for at least 48 hours prior to the  first CHG shower.  You may shave your face/neck. Please follow these instructions carefully:  1.  Shower with CHG Soap the night before surgery and the  morning of Surgery.  2.  If you choose to wash your hair, wash your hair first as usual with your  normal  shampoo.  3.  After you shampoo, rinse your hair and body thoroughly to remove the  shampoo.                           4.  Use CHG as you would any other liquid soap.  You can apply chg directly  to the skin and wash                       Gently with a scrungie or clean washcloth.  5.  Apply the CHG Soap to your  body ONLY FROM THE NECK DOWN.   Do not use on face/ open                           Wound or open sores. Avoid contact with eyes, ears mouth and genitals (private parts).                       Wash face,  Genitals (private parts) with your normal soap.             6.  Wash thoroughly, paying special attention to the area where your surgery  will be performed.  7.  Thoroughly rinse your body with warm water from the neck down.  8.  DO NOT shower/wash with your normal soap after using and rinsing off  the CHG Soap.                9.  Pat yourself dry with a clean towel.            10.  Wear clean pajamas.            11.  Place clean sheets on your bed the night of your first shower and do not  sleep with pets. Day of Surgery : Do not apply any lotions/deodorants the morning of surgery.  Please wear clean clothes to the hospital/surgery center.  FAILURE TO FOLLOW THESE INSTRUCTIONS MAY RESULT IN THE CANCELLATION OF YOUR SURGERY PATIENT SIGNATURE_________________________________  NURSE SIGNATURE__________________________________  ________________________________________________________________________  WHAT IS A BLOOD TRANSFUSION? Blood Transfusion Information  A transfusion is the replacement of blood or some of its parts. Blood is made up of multiple cells which provide different functions.  Red blood cells carry oxygen and are  used for blood loss replacement.  White blood cells fight against infection.  Platelets control bleeding.  Plasma helps clot blood.  Other blood products are available for specialized needs, such as hemophilia or other clotting disorders. BEFORE THE TRANSFUSION  Who gives blood for transfusions?   Healthy volunteers who are fully evaluated to make sure their blood is safe. This is blood bank blood. Transfusion therapy is the safest it has ever been in the practice of medicine. Before blood is taken from a donor, a complete history is taken to make sure that person has no history of diseases nor engages in risky social behavior (examples are intravenous drug use or sexual activity with multiple partners). The donor's travel history is screened to minimize risk of transmitting infections, such as malaria. The donated blood is tested for signs of infectious diseases, such as HIV and hepatitis. The blood is then tested to be sure it is compatible with you in order to minimize the chance of a transfusion reaction. If you or a relative donates blood, this is often done in anticipation of surgery and is not appropriate for emergency situations. It takes many days to process the donated blood. RISKS AND COMPLICATIONS Although transfusion therapy is very safe and saves many lives, the main dangers of transfusion include:   Getting an infectious disease.  Developing a transfusion reaction. This is an allergic reaction to something in the blood you were given. Every precaution is taken to prevent this. The decision to have a blood transfusion has been considered carefully by your caregiver before blood is given. Blood is not given unless the benefits outweigh the risks. AFTER THE TRANSFUSION  Right after receiving a blood transfusion, you will usually feel  much better and more energetic. This is especially true if your red blood cells have gotten low (anemic). The transfusion raises the level of the red  blood cells which carry oxygen, and this usually causes an energy increase.  The nurse administering the transfusion will monitor you carefully for complications. HOME CARE INSTRUCTIONS  No special instructions are needed after a transfusion. You may find your energy is better. Speak with your caregiver about any limitations on activity for underlying diseases you may have. SEEK MEDICAL CARE IF:   Your condition is not improving after your transfusion.  You develop redness or irritation at the intravenous (IV) site. SEEK IMMEDIATE MEDICAL CARE IF:  Any of the following symptoms occur over the next 12 hours:  Shaking chills.  You have a temperature by mouth above 102 F (38.9 C), not controlled by medicine.  Chest, back, or muscle pain.  People around you feel you are not acting correctly or are confused.  Shortness of breath or difficulty breathing.  Dizziness and fainting.  You get a rash or develop hives.  You have a decrease in urine output.  Your urine turns a dark color or changes to pink, red, or brown. Any of the following symptoms occur over the next 10 days:  You have a temperature by mouth above 102 F (38.9 C), not controlled by medicine.  Shortness of breath.  Weakness after normal activity.  The white part of the eye turns yellow (jaundice).  You have a decrease in the amount of urine or are urinating less often.  Your urine turns a dark color or changes to pink, red, or brown. Document Released: 12/17/1999 Document Revised: 03/13/2011 Document Reviewed: 08/05/2007 [facility] Patient Information 2014 [place], [place].  _______________________________________________________________________

## 2017-11-27 NOTE — Patient Instructions (Signed)
Medication Instructions:  NONE If you need a refill on your cardiac medications before your next appointment, please call your pharmacy.   Lab work: NONE If you have labs (blood work) drawn today and your tests are completely normal, you will receive your results only by: Marland Kitchen MyChart Message (if you have MyChart) OR . A paper copy in the mail If you have any lab test that is abnormal or we need to change your treatment, we will call you to review the results.  Testing/Procedures: NONE  Follow-Up: At [facility], you and your health needs are our priority.  As part of our continuing mission to provide you with exceptional heart care, we have created designated Provider Care Teams.  These Care Teams include your primary Cardiologist (physician) and Advanced Practice Providers (APPs -  Physician Assistants and Nurse Practitioners) who all work together to provide you with the care you need, when you need it. You will need a follow up appointment in [place].  Kerin Ransom, PA-C [place], [place] . Sande Rives, PA-C

## 2017-11-27 NOTE — Assessment & Plan Note (Signed)
History of hyperlipidemia on statin therapy. 

## 2017-11-28 LAB — ABO/RH: ABO/RH(D): O POS

## 2017-11-28 NOTE — Progress Notes (Incomplete)
CXR result from 11/27/2017 faxed via epic to Dr [name].  Spoke with Anesthesia regarding patient with recent admission of pneumonia 11/19/2017-11/23/2017.  Patient seen by cardiologist on 11/27/2017 and cardiologist ( DR Gwenlyn Found ) is aware patient having surgery on 12/07/2017.  CXR repeated on 11/27/2017 - negative.  Anesthesia aware of all of above.  No new orders given.  Called and LVMM forConi Mabe at [facility] with above information that Anesthesia was aware and no new orders given.   EKG-11/21/2017-on chart and in epic 11/27/2017- CXR-epic and on chart 11/19/2017-CXR on chart. CT chedst-11/22/2017 on chart  DR Quay Burow ( card) - 11/27/2017- note on chart  ECHO-11/20/2017- on chart

## 2017-12-03 ENCOUNTER — Encounter (INDEPENDENT_AMBULATORY_CARE_PROVIDER_SITE_OTHER): Payer: PPO | Admitting: Ophthalmology

## 2017-12-03 DIAGNOSIS — H43813 Vitreous degeneration, bilateral: Secondary | ICD-10-CM

## 2017-12-03 DIAGNOSIS — M797 Fibromyalgia: Secondary | ICD-10-CM

## 2017-12-03 DIAGNOSIS — H353121 Nonexudative age-related macular degeneration, left eye, early dry stage: Secondary | ICD-10-CM | POA: Diagnosis not present

## 2017-12-03 DIAGNOSIS — I10 Essential (primary) hypertension: Secondary | ICD-10-CM

## 2017-12-03 DIAGNOSIS — H35033 Hypertensive retinopathy, bilateral: Secondary | ICD-10-CM | POA: Diagnosis not present

## 2017-12-04 ENCOUNTER — Ambulatory Visit (INDEPENDENT_AMBULATORY_CARE_PROVIDER_SITE_OTHER): Payer: PPO | Admitting: Family Medicine

## 2017-12-04 ENCOUNTER — Encounter: Payer: Self-pay | Admitting: Family Medicine

## 2017-12-04 VITALS — BP 112/74 | HR 85 | Temp 97.9°F | Wt 173.8 lb

## 2017-12-04 DIAGNOSIS — R05 Cough: Secondary | ICD-10-CM

## 2017-12-04 DIAGNOSIS — J4530 Mild persistent asthma, uncomplicated: Secondary | ICD-10-CM | POA: Diagnosis not present

## 2017-12-04 DIAGNOSIS — J181 Lobar pneumonia, unspecified organism: Secondary | ICD-10-CM

## 2017-12-04 DIAGNOSIS — J189 Pneumonia, unspecified organism: Secondary | ICD-10-CM

## 2017-12-04 DIAGNOSIS — R059 Cough, unspecified: Secondary | ICD-10-CM

## 2017-12-04 MED ORDER — AZITHROMYCIN 500 MG PO TABS: 500.0000 mg | ORAL_TABLET | Freq: Every day | ORAL | 0 refills | Status: DC

## 2017-12-04 NOTE — Patient Instructions (Addendum)
Take the antibiotic for 3 days also use a probiotic.  If you need to you can also use Lomotil to help with the diarrhea.  Call if you are not better

## 2017-12-04 NOTE — Progress Notes (Signed)
   Subjective:    Patient ID: [name] Long, female    DOB: [birth year], 77 y.o.   MRN: [id number]  HPI She is here for consult concerning recent hospitalization and treatment for pneumonia.  She is also scheduled for kidney surgery but this has to be postponed because of this.  She finished the antibiotics.  They did cause some difficulty with diarrhea however that is cleared up.  She still has a slightly hoarse voice and is coughing but no fever, chills, shortness of breath.  She continues on her asthma medications.  She has had to use the albuterol just twice per week.   Review of Systems     Objective:   Physical Exam Alert and in no distress. Tympanic membranes and canals are normal. Pharyngeal area is normal. Neck is supple without adenopathy or thyromegaly. Cardiac exam shows a regular sinus rhythm without murmurs or gallops. Lungs are clear to auscultation.        Assessment & Plan:  Pneumonia of both upper lobes due to infectious organism ([place]) - Plan: azithromycin (ZITHROMAX) 500 MG tablet  Cough - Plan: azithromycin (ZITHROMAX) 500 MG tablet  Mild persistent chronic asthma without complication Since she still having some difficulty with hoarse voice and cough with congestion, I have decided to give her another course of azithromycin.  Discussed possible complication of diarrhea and recommend probiotic.  If she continues to have difficulty, I will refer to pulmonary.  She was comfortable with that.  Over 25 minutes, greater than 50% spent in counseling and coordination of care.

## 2017-12-04 NOTE — Discharge Summary (Signed)
Physician Discharge Summary  [name] Long BZJ:[id number] DOB: 03-06-1940 DOA: 11/19/2017  PCP: Denita Lung, MD  Admit date: 11/19/2017 Discharge date: 11/23/2017  Admitted From: *Home Disposition:  Home  Recommendations for Outpatient Follow-up:  1. Follow up with PCP in 1-2 weeks 2. Please obtain BMP/CBC in one week   Discharge Condition:stable CODE STATUS:full code Diet recommendation: Heart Healthy   Brief/Interim Summary: 77 y.o.femalewith history h/oasthma, CAD, chronic back pain secondary to degenerative disc disease, arthritis, fibromyalgia who is on multiple pain medication/muscle relaxants presents with complaints of cough and shortness of breath for 3 days.Work-up in the ED revealed negative flu, WBC 7.6, troponin 0 0.06 and abnormal EKG. Chest x-ray showed bilateral upper lobe hazy infiltrates.admitted and treated for pneumonia. Discharge Diagnoses:  Active Problems:   Fibromyalgia   S/P CABG x 3: (lima-lad,svg-om,svg-rca)   CAD (coronary artery disease): multivessel, good LVF 50% at cath   Hyperlipidemia   GERD (gastroesophageal reflux disease)   Rheumatoid arthritis (HCC)   Essential hypertension   Pneumonia  Bilateral upper lobe infiltrates/acute hypoxic respiratory failure Treat the patient with IV Rocephin and Zithromax for community-acquired pneumonia transition to IV Unasyn to cover aspiration pneumonia and to augmentin as she is able to take oral meds.       Fibromyalgia Continue with home medications with decreased gabapentin dose  Diarrhea C. difficile PCR is negative.  Suspect probably from a antibiotic use.  Gi pathogen pcr is negative  Symptomatic management .     Coronary artery disease She currently denies any chest pain.  Last echocardiogram in 2018 shows preserved left ventricular ejection fraction.  Resume aspirin Lipitor and metoprolol.  On admission troponins were flat.  Please repeat EKG done and personally reviewed   shows diffuse st t wave changes unchanged from May 2019 .     GERD stable   Hyperlipidemia resume Lipitor  Discharge Instructions  Discharge Instructions    DME Nebulizer machine   Complete by:  As directed    Patient needs a nebulizer to treat with the following condition:  Asthma   Diet - low sodium heart healthy   Complete by:  As directed    Discharge instructions   Complete by:  As directed    Please follow up with PCP in one week.     Allergies as of 11/23/2017      Reactions   Dimenhydrinate Other (See Comments)   "locks up kidneys"   Morphine And Related Nausea And Vomiting   Percocet [oxycodone-acetaminophen] Other (See Comments)   hallucinations   Zolpidem Other (See Comments)   Unusual behavior      Medication List    STOP taking these medications   magnesium oxide 400 MG tablet Commonly known as:  MAG-OX     TAKE these medications   ADVAIR DISKUS 500-50 MCG/DOSE Aepb Generic drug:  Fluticasone-Salmeterol INHALE ONE PUFF BY MOUTH INTO THE LUNGS TWICE A DAY What changed:  See the new instructions.   albuterol 108 (90 Base) MCG/ACT inhaler Commonly known as:  PROVENTIL HFA;VENTOLIN HFA INHALE 2 PUFF AS DIRECTED FOUR TIMES A DAY AS NEEDED What changed:    how much to take  how to take this  when to take this  reasons to take this  additional instructions   aspirin EC 81 MG tablet Take 81 mg by mouth daily.   atorvastatin 20 MG tablet Commonly known as:  LIPITOR TAKE ONE TABLET BY MOUTH DAILY **MUST CALL MD FOR APPOINTMENT What changed:  See  the new instructions.   butalbital-acetaminophen-caffeine 50-325-40 MG tablet Commonly known as:  FIORICET, ESGIC Take 1 tablet by mouth 2 (two) times daily as needed for headache.   CALCIUM-D PO Take 1 tablet by mouth daily.   carisoprodol 350 MG tablet Commonly known as:  SOMA Take 350 mg by mouth daily as needed (arthritis pain).   cholecalciferol 25 MCG (1000 UT) tablet Commonly  known as:  VITAMIN D3 Take 1,000 Units by mouth daily.   gabapentin 600 MG tablet Commonly known as:  NEURONTIN Take 1,200-1,800 mg by mouth See admin instructions. Take 2 tablets (1200 mg) by mouth every morning and 3 tablets (1800 mg) at bedtime   guaiFENesin-dextromethorphan 100-10 MG/5ML syrup Commonly known as:  ROBITUSSIN DM Take 5 mLs by mouth every 4 (four) hours as needed for cough.   HYDROmorphone 4 MG tablet Commonly known as:  DILAUDID Take 4 mg by mouth 2 (two) times daily as needed for severe pain.   hydroxychloroquine 200 MG tablet Commonly known as:  PLAQUENIL Take 200 mg by mouth at bedtime.   metoprolol tartrate 25 MG tablet Commonly known as:  LOPRESSOR TAKE ONE TABLET BY MOUTH TWICE A DAY   montelukast 10 MG tablet Commonly known as:  SINGULAIR TAKE ONE TABLET BY MOUTH DAILY What changed:    how much to take  how to take this  when to take this  additional instructions   predniSONE 20 MG tablet Commonly known as:  DELTASONE Prednisone 60 mg daily for 2 days followed by  Prednisone 40 mg daily for 3 days followed by Prednisone 20 mg daily for 3 days   silodosin 8 MG Caps capsule Commonly known as:  RAPAFLO Take 8 mg by mouth daily with breakfast.   SYSTANE OP Apply 1 drop to eye daily as needed (dry eyes).            Durable Medical Equipment  (From admission, onward)         Start     Ordered   11/23/17 0000  DME Nebulizer machine    Question:  Patient needs a nebulizer to treat with the following condition  Answer:  Asthma   11/23/17 1234         Follow-up Information    Denita Lung, MD. Schedule an appointment as soon as possible for a visit in 1 week(s).   Specialty:  Family Medicine Contact information: [place] [address] [phone]        Lorretta Harp, MD .   Specialties:  Cardiology, Radiology Contact information: [address] [place] [address] [place]  [address] [place], [place] Follow up.   Specialty:  [place] Why:  A representative from [facility] wwill contact you to arrange start time for [facility] to assess.  Contact information: [place] [address] [phone]          Allergies  Allergen Reactions  . Dimenhydrinate Other (See Comments)    "locks up kidneys"  . Morphine And Related Nausea And Vomiting  . Percocet [Oxycodone-Acetaminophen] Other (See Comments)    hallucinations  . Zolpidem Other (See Comments)    Unusual behavior    Consultations:  none   Procedures/Studies: Dg Chest 2 View  Result Date: 11/28/2017 CLINICAL DATA:  Preop partial nephrectomy EXAM: CHEST - 2 VIEW COMPARISON:  11/19/2017 FINDINGS: Prior median sternotomy and CABG. Heart is normal size. Lungs are clear. No  effusions. No acute bony abnormality. IMPRESSION: No active cardiopulmonary disease. Electronically Signed   By: Rolm Baptise M.D.   On: 11/28/2017 08:02   Dg Chest 2 View  Result Date: 11/19/2017 CLINICAL DATA:  Productive cough. EXAM: CHEST - 2 VIEW COMPARISON:  Two-view chest x-ray 10/04/2017. FINDINGS: The heart size is normal. Bilateral medial upper lobe airspace opacities are new since the prior exam, left greater than right. There is no edema or effusion. No other airspace consolidation is present. Degenerative changes of the thoracic spine are stable. IMPRESSION: 1. New left greater than right upper lobe airspace disease is concerning for pneumonia. Electronically Signed   By: [place] Morelle M.D.   On: 11/19/2017 13:36   Ct Chest W Contrast  Result Date: 11/23/2017 CLINICAL DATA:  History of asthma. Fibromyalgia. Cough and shortness of breath for 3 days. EXAM: CT CHEST WITH CONTRAST TECHNIQUE: Multidetector CT imaging of the chest was performed during intravenous contrast administration. CONTRAST:  39mL OMNIPAQUE IOHEXOL 300 MG/ML  SOLN COMPARISON:  Chest  x-ray November 19, 2017 FINDINGS: Cardiovascular: The thoracic aorta demonstrates atherosclerotic change but no aneurysm or dissection. The heart size is normal. Coronary artery calcifications noted. Central pulmonary arteries are unremarkable. Mediastinum/Nodes: The thyroid and esophagus are unremarkable. No effusions. There is a prominent prevascular space node measuring 15 mm, probably reactive. No other adenopathy identified. Lungs/Pleura: Bronchial wall thickening in the left base. The airways are otherwise normal. There is patchy ground-glass opacity in the medial left upper lobe with more focal infiltrate abutting the mediastinum/aortic arch. There is also mild increased infiltrate in the left base as seen on axial image 93. No right-sided infiltrate. No other suspicious findings in the lungs. Upper Abdomen: No acute abnormality. Musculoskeletal: No chest wall abnormality. No acute or significant osseous findings. IMPRESSION: 1. Infiltrate in the left upper and lower lobes consistent with pneumonia. Recommend follow-up to resolution. 2. Probable reactive pre-vascular lymph node. Recommend attention on follow-up. 3. Atherosclerotic change in the thoracic aorta. Coronary artery calcifications. Aortic Atherosclerosis (ICD10-I70.0). Electronically Signed   By: Dorise Bullion III M.D   On: 11/23/2017 00:03       Subjective: No complaints   Discharge Exam: Vitals:   11/23/17 0755 11/23/17 1140  BP:    Pulse:    Resp:    Temp:    SpO2: 92% 92%   Vitals:   11/22/17 2018 11/23/17 0349 11/23/17 0755 11/23/17 1140  BP:  131/73    Pulse: (!) 112 78    Resp: 18 19    Temp:  98.3 F (36.8 C)    TempSrc:  Oral    SpO2: 93% 94% 92% 92%    General: Pt is alert, awake, not in acute distress Cardiovascular: RRR, S1/S2 +, no rubs, no gallops Respiratory: CTA bilaterally, no wheezing, no rhonchi Abdominal: Soft, NT, ND, bowel sounds + Extremities: no edema, no cyanosis    The results of  significant diagnostics from this hospitalization (including imaging, microbiology, ancillary and laboratory) are listed below for reference.     Microbiology: No results found for this or any previous visit (from the past 240 hour(s)).   Labs: BNP (last 3 results) Recent Labs    11/19/17 1847  BNP 78.9   Basic Metabolic Panel: Recent Labs  Lab 11/27/17 1343  NA 142  K 3.7  CL 104  CO2 28  GLUCOSE 136*  BUN 22  CREATININE 1.04*  CALCIUM 8.8*   Liver Function Tests: No results for input(s): AST, ALT, ALKPHOS,  BILITOT, PROT, ALBUMIN in the last 168 hours. No results for input(s): LIPASE, AMYLASE in the last 168 hours. No results for input(s): AMMONIA in the last 168 hours. CBC: Recent Labs  Lab 11/27/17 1343  WBC 9.9  HGB 14.2  HCT 45.7  MCV 90.0  PLT 394   Cardiac Enzymes: No results for input(s): CKTOTAL, CKMB, CKMBINDEX, TROPONINI in the last 168 hours. BNP: Invalid input(s): POCBNP CBG: No results for input(s): GLUCAP in the last 168 hours. D-Dimer No results for input(s): DDIMER in the last 72 hours. Hgb A1c No results for input(s): HGBA1C in the last 72 hours. Lipid Profile No results for input(s): CHOL, HDL, LDLCALC, TRIG, CHOLHDL, LDLDIRECT in the last 72 hours. Thyroid function studies No results for input(s): TSH, T4TOTAL, T3FREE, THYROIDAB in the last 72 hours.  Invalid input(s): FREET3 Anemia work up No results for input(s): VITAMINB12, FOLATE, FERRITIN, TIBC, IRON, RETICCTPCT in the last 72 hours. Urinalysis    Component Value Date/Time   COLORURINE YELLOW 07/08/2017 0450   APPEARANCEUR CLEAR 07/08/2017 0450   LABSPEC 1.010 10/01/2017 1406   PHURINE 6.5 07/08/2017 0450   GLUCOSEU NEGATIVE 07/08/2017 0450   HGBUR NEGATIVE 07/08/2017 0450   BILIRUBINUR negative 10/01/2017 1406   KETONESUR negative 10/01/2017 1406   [place] 07/08/2017 0450   PROTEINUR negative 10/01/2017 1406   PROTEINUR NEGATIVE 07/08/2017 0450   UROBILINOGEN  0.2 08/13/2011 0741   NITRITE Negative 10/01/2017 1406   NITRITE NEGATIVE 07/08/2017 0450   LEUKOCYTESUR Negative 10/01/2017 1406   Sepsis Labs Invalid input(s): PROCALCITONIN,  WBC,  LACTICIDVEN Microbiology No results found for this or any previous visit (from the past 240 hour(s)).   Time coordinating discharge:35  minutes  SIGNED:   Hosie Poisson, MD  [facility] 12/04/2017, 1:26 PM Pager   If 7PM-7AM, please contact night-coverage [email or web address] Password [email or web address]

## 2017-12-07 ENCOUNTER — Encounter (HOSPITAL_COMMUNITY): Admission: RE | Payer: Self-pay | Source: Ambulatory Visit

## 2017-12-07 ENCOUNTER — Ambulatory Visit (HOSPITAL_COMMUNITY): Admission: RE | Admit: 2017-12-07 | Payer: PPO | Source: Ambulatory Visit | Admitting: Urology

## 2017-12-07 HISTORY — DX: Gastro-esophageal reflux disease without esophagitis: K21.9

## 2017-12-07 SURGERY — NEPHRECTOMY, PARTIAL, ROBOT-ASSISTED
Anesthesia: General | Laterality: Right

## 2017-12-10 LAB — TYPE AND SCREEN
ABO/RH(D): O POS
Antibody Screen: NEGATIVE

## 2017-12-23 ENCOUNTER — Other Ambulatory Visit: Payer: Self-pay | Admitting: Family Medicine

## 2017-12-23 DIAGNOSIS — E785 Hyperlipidemia, unspecified: Secondary | ICD-10-CM

## 2018-01-02 DIAGNOSIS — M545 Low back pain, unspecified: Secondary | ICD-10-CM

## 2018-01-02 HISTORY — DX: Low back pain, unspecified: M54.50

## 2018-01-08 ENCOUNTER — Other Ambulatory Visit: Payer: Self-pay | Admitting: Urology

## 2018-02-04 DIAGNOSIS — M150 Primary generalized (osteo)arthritis: Secondary | ICD-10-CM | POA: Diagnosis not present

## 2018-02-04 DIAGNOSIS — G894 Chronic pain syndrome: Secondary | ICD-10-CM | POA: Diagnosis not present

## 2018-02-04 DIAGNOSIS — M25551 Pain in right hip: Secondary | ICD-10-CM | POA: Diagnosis not present

## 2018-02-04 DIAGNOSIS — Z79891 Long term (current) use of opiate analgesic: Secondary | ICD-10-CM | POA: Diagnosis not present

## 2018-02-14 DIAGNOSIS — N3941 Urge incontinence: Secondary | ICD-10-CM | POA: Diagnosis not present

## 2018-02-14 DIAGNOSIS — D4101 Neoplasm of uncertain behavior of right kidney: Secondary | ICD-10-CM | POA: Diagnosis not present

## 2018-02-18 NOTE — Patient Instructions (Addendum)
CRISTEN BREDESON  02/18/2018   Your procedure is scheduled on: 02-27-2018   Report to [facility] Main  Entrance     Report to admitting at 10:30AM    Call this number if you have problems the morning of surgery [phone]      Remember: Do not eat food or drink liquids :After Midnight. BRUSH YOUR TEETH MORNING OF SURGERY AND RINSE YOUR MOUTH OUT, NO CHEWING GUM CANDY OR MINTS.     Take these medicines the morning of surgery with A SIP OF WATER: metoprolol, silodosin, gabapentin, Advair inhaler, albuterol inhaler if needed                                You may not have any metal on your body including hair pins and              piercings  Do not wear jewelry, make-up, lotions, powders or perfumes, deodorant             Do not wear nail polish.  Do not shave  48 hours prior to surgery.            Do not bring valuables to the hospital. [place].  Contacts, dentures or bridgework may not be worn into surgery.  Leave suitcase in the car. After surgery it may be brought to your room.                 Please read over the following fact sheets you were given: _____________________________________________________________________             [facility] - Preparing for Surgery Before surgery, you can play an important role.  Because skin is not sterile, your skin needs to be as free of germs as possible.  You can reduce the number of germs on your skin by washing with CHG (chlorahexidine gluconate) soap before surgery.  CHG is an antiseptic cleaner which kills germs and bonds with the skin to continue killing germs even after washing. Please DO NOT use if you have an allergy to CHG or antibacterial soaps.  If your skin becomes reddened/irritated stop using the CHG and inform your nurse when you arrive at Short Stay. Do not shave (including legs and underarms) for at least 48 hours prior to the first CHG shower.   You may shave your face/neck. Please follow these instructions carefully:  1.  Shower with CHG Soap the night before surgery and the  morning of Surgery.  2.  If you choose to wash your hair, wash your hair first as usual with your  normal  shampoo.  3.  After you shampoo, rinse your hair and body thoroughly to remove the  shampoo.                           4.  Use CHG as you would any other liquid soap.  You can apply chg directly  to the skin and wash                       Gently with a scrungie or clean washcloth.  5.  Apply the CHG Soap to your body ONLY FROM  THE NECK DOWN.   Do not use on face/ open                           Wound or open sores. Avoid contact with eyes, ears mouth and genitals (private parts).                       Wash face,  Genitals (private parts) with your normal soap.             6.  Wash thoroughly, paying special attention to the area where your surgery  will be performed.  7.  Thoroughly rinse your body with warm water from the neck down.  8.  DO NOT shower/wash with your normal soap after using and rinsing off  the CHG Soap.                9.  Pat yourself dry with a clean towel.            10.  Wear clean pajamas.            11.  Place clean sheets on your bed the night of your first shower and do not  sleep with pets. Day of Surgery : Do not apply any lotions/deodorants the morning of surgery.  Please wear clean clothes to the hospital/surgery center.  FAILURE TO FOLLOW THESE INSTRUCTIONS MAY RESULT IN THE CANCELLATION OF YOUR SURGERY PATIENT SIGNATURE_________________________________  NURSE SIGNATURE__________________________________  ________________________________________________________________________  WHAT IS A BLOOD TRANSFUSION? Blood Transfusion Information  A transfusion is the replacement of blood or some of its parts. Blood is made up of multiple cells which provide different functions.  Red blood cells carry oxygen and are used for blood  loss replacement.  White blood cells fight against infection.  Platelets control bleeding.  Plasma helps clot blood.  Other blood products are available for specialized needs, such as hemophilia or other clotting disorders. BEFORE THE TRANSFUSION  Who gives blood for transfusions?   Healthy volunteers who are fully evaluated to make sure their blood is safe. This is blood bank blood. Transfusion therapy is the safest it has ever been in the practice of medicine. Before blood is taken from a donor, a complete history is taken to make sure that person has no history of diseases nor engages in risky social behavior (examples are intravenous drug use or sexual activity with multiple partners). The donor's travel history is screened to minimize risk of transmitting infections, such as malaria. The donated blood is tested for signs of infectious diseases, such as HIV and hepatitis. The blood is then tested to be sure it is compatible with you in order to minimize the chance of a transfusion reaction. If you or a relative donates blood, this is often done in anticipation of surgery and is not appropriate for emergency situations. It takes many days to process the donated blood. RISKS AND COMPLICATIONS Although transfusion therapy is very safe and saves many lives, the main dangers of transfusion include:   Getting an infectious disease.  Developing a transfusion reaction. This is an allergic reaction to something in the blood you were given. Every precaution is taken to prevent this. The decision to have a blood transfusion has been considered carefully by your caregiver before blood is given. Blood is not given unless the benefits outweigh the risks. AFTER THE TRANSFUSION  Right after receiving a blood transfusion, you will usually feel much better and  more energetic. This is especially true if your red blood cells have gotten low (anemic). The transfusion raises the level of the red blood cells  which carry oxygen, and this usually causes an energy increase.  The nurse administering the transfusion will monitor you carefully for complications. HOME CARE INSTRUCTIONS  No special instructions are needed after a transfusion. You may find your energy is better. Speak with your caregiver about any limitations on activity for underlying diseases you may have. SEEK MEDICAL CARE IF:   Your condition is not improving after your transfusion.  You develop redness or irritation at the intravenous (IV) site. SEEK IMMEDIATE MEDICAL CARE IF:  Any of the following symptoms occur over the next 12 hours:  Shaking chills.  You have a temperature by mouth above 102 F (38.9 C), not controlled by medicine.  Chest, back, or muscle pain.  People around you feel you are not acting correctly or are confused.  Shortness of breath or difficulty breathing.  Dizziness and fainting.  You get a rash or develop hives.  You have a decrease in urine output.  Your urine turns a dark color or changes to pink, red, or brown. Any of the following symptoms occur over the next 10 days:  You have a temperature by mouth above 102 F (38.9 C), not controlled by medicine.  Shortness of breath.  Weakness after normal activity.  The white part of the eye turns yellow (jaundice).  You have a decrease in the amount of urine or are urinating less often.  Your urine turns a dark color or changes to pink, red, or brown. Document Released: 12/17/1999 Document Revised: 03/13/2011 Document Reviewed: 08/05/2007 [facility] Patient Information 2014 [place], [place].  _______________________________________________________________________

## 2018-02-18 NOTE — Progress Notes (Signed)
ekg 11-21-17 epic   cxr 11-28-17 epic   Echo 11-20-17 epic   lov cards dr berry 01-27-17 epic

## 2018-02-19 ENCOUNTER — Encounter (HOSPITAL_COMMUNITY)
Admission: RE | Admit: 2018-02-19 | Discharge: 2018-02-19 | Disposition: A | Discharge status: Home / Self Care | Payer: PPO | Source: Ambulatory Visit | Attending: Urology | Admitting: Urology

## 2018-02-19 ENCOUNTER — Encounter (HOSPITAL_COMMUNITY): Payer: Self-pay

## 2018-02-19 ENCOUNTER — Other Ambulatory Visit: Payer: Self-pay

## 2018-02-19 DIAGNOSIS — M5136 Other intervertebral disc degeneration, lumbar region: Secondary | ICD-10-CM | POA: Diagnosis not present

## 2018-02-19 DIAGNOSIS — M5030 Other cervical disc degeneration, unspecified cervical region: Secondary | ICD-10-CM | POA: Diagnosis not present

## 2018-02-19 DIAGNOSIS — M359 Systemic involvement of connective tissue, unspecified: Secondary | ICD-10-CM | POA: Diagnosis not present

## 2018-02-19 DIAGNOSIS — Z01812 Encounter for preprocedural laboratory examination: Secondary | ICD-10-CM | POA: Insufficient documentation

## 2018-02-19 DIAGNOSIS — M797 Fibromyalgia: Secondary | ICD-10-CM | POA: Diagnosis not present

## 2018-02-19 DIAGNOSIS — M150 Primary generalized (osteo)arthritis: Secondary | ICD-10-CM | POA: Diagnosis not present

## 2018-02-19 LAB — CBC
HEMATOCRIT: 41.2 % (ref 36.0–46.0)
HEMOGLOBIN: 12.6 g/dL (ref 12.0–15.0)
MCH: 28.8 pg (ref 26.0–34.0)
MCHC: 30.6 g/dL (ref 30.0–36.0)
MCV: 94.3 fL (ref 80.0–100.0)
PLATELETS: 185 10*3/uL (ref 150–400)
RBC: 4.37 MIL/uL (ref 3.87–5.11)
RDW: 13.5 % (ref 11.5–15.5)
WBC: 5.1 10*3/uL (ref 4.0–10.5)
nRBC: 0 % (ref 0.0–0.2)

## 2018-02-19 LAB — BASIC METABOLIC PANEL
Anion gap: 5 (ref 5–15)
BUN: 28 mg/dL — AB (ref 8–23)
CHLORIDE: 109 mmol/L (ref 98–111)
CO2: 29 mmol/L (ref 22–32)
Calcium: 9 mg/dL (ref 8.9–10.3)
Creatinine, Ser: 0.88 mg/dL (ref 0.44–1.00)
GFR calc Af Amer: >60 mL/min (ref >60)
GFR calc non Af Amer: >60 mL/min (ref >60)
GLUCOSE: 102 mg/dL — AB (ref 70–99)
POTASSIUM: 5.6 mmol/L — AB (ref 3.5–5.1)
Sodium: 143 mmol/L (ref 135–145)

## 2018-02-25 NOTE — H&P (Signed)
Urology Preoperative H&P   Chief Complaint: Right renal mass  History of Present Illness: [name] Long is a 78 y.o. female with a solid and enhancing 1.1 cm right renal mass recent CT abdomen/pelvis with and without contrast from 10/03/17. The scan was initially ordered due to ongoing abdominal pain after the patient bluntly impaled herself in the abdomen with a wheelbarrow handle.   She denies any personal/family history of GU malignancies. She does have a long-standing history of incomplete bladder emptying and urinary urgency. Denies interval UTIs, flank pain, dysuria or hematuria. She currently takes Rapaflo and intermittently catheter herself. She does have a significant cardiac history who underwent triple bypass surgery in 2013. She is currently followed by Dr. Gwenlyn Found with cardiology.   Sees Dr. Gwenlyn Found with Cardiology. Hx of CABG x3 in 2013.   Past Medical History:  Diagnosis Date  . Abnormal EKG 01/12/2011  . Allergy    RHINITIS  . Arthritis   . Asthma   . Asthma 01/12/2011  . Back pain, chronic--plans were for cervical disc surgery week of 01/16/11 01/12/2011  . Blood clot in vein 2004   L leg  . Blood transfusion without reported diagnosis 2004  . Chronic low back pain   . Complication of anesthesia   . Connective tissue disease ([place])   . Coronary artery disease   . Crescendo angina, with EKG changes, new. 01/12/2011  . Dyspnea   . Fibromyalgia   . Fibromyalgia   . GERD (gastroesophageal reflux disease)   . Headache    migraine - several in a yr.   . History of stress test 04/2011   Normal stress test  . Hx of echocardiogram 07/19/2011   EF 50-55%. There is mild annular calcification, there is trace migtral regurgitation, there is trace tricupid regurgitation, pericardium is thick, there is no pericardial effusion.  . Hypertension   . Myocardial infarction [facility]) 2013   triple heart bypass  . Neuromuscular disorder (HCC)    fibromyalgia   . Osteoporosis    OSTEOPENIA  .  Personal history of colonic polyp - adenoma 08/05/2013   08/2013 - 7 mm adenoma - consider repeat colonoscopy 2020 (age 37)  . Pneumonia    numerous times, only admitted one time for pneumonia but she has had pneumonia numerous times;   02-19-2018 END OF NOVEMBER 2019  WAS ADMITTED FOR ABOUT  A WEEK DUE TO "DOUBLE PNEUMONIA", REPORTS TODAY FEELING BACK TO HER NORMAL , DENIES SOB, O2 SAT AT 94 ROOM AIR , AFEBRILE, OTHER VSS   . PONV (postoperative nausea and vomiting)   . PPD positive   . Status post coronary artery bypass grafting   . Urinary incontinence    post op, "sleepy bladder", urinar retention     Past Surgical History:  Procedure Laterality Date  . ABDOMINAL HYSTERECTOMY  1981  . APPENDECTOMY  1946  . BLADDER REPAIR  1985  . BREAST SURGERY  1992   REDUCTION, x3 surgeries overall on breast for augmentation   . CARDIAC CATHETERIZATION  2013  . CARPAL TUNNEL RELEASE Bilateral 1988, 1999  . [place]  . CORONARY ARTERY BYPASS GRAFT  01/14/2011   Procedure: CORONARY ARTERY BYPASS GRAFTING (CABG);  Surgeon: Tharon Aquas Adelene Idler, MD;  Location: [place];  Service: Open Heart Surgery;  Laterality: N/A;  CABG x three, using right greater saphenous vein harvested endoscopically  . EYE SURGERY Bilateral 1985   radiokerototomy  . LEFT HEART CATHETERIZATION WITH CORONARY  ANGIOGRAM N/A 01/13/2011   Procedure: LEFT HEART CATHETERIZATION WITH CORONARY ANGIOGRAM;  Surgeon: Troy Sine, MD;  Location: [facility] CATH LAB;  Service: Cardiovascular;  Laterality: N/A;  . PUBOVAGINAL SLING  2002  . STERNAL WOUND DEBRIDEMENT  07/27/2011   Procedure: STERNAL WOUND DEBRIDEMENT;  followed by wound vac Surgeon: Ivin Poot, MD;  Location: [place];  Service: Open Heart Surgery;  Laterality: N/A;  . TONSILLECTOMY  1952  . TOTAL HIP ARTHROPLASTY Right 08/22/2016   Procedure: TOTAL HIP ARTHROPLASTY ANTERIOR APPROACH;  Surgeon: Melrose Nakayama, MD;  Location: [place];  Service: Orthopedics;   Laterality: Right;  . TRIGGER FINGER RELEASE Right 2014   4th finger  . TROCHANTERIC BURSA EXCISION Right    bursa removed, R hip  . TUBAL LIGATION      Allergies:  Allergies  Allergen Reactions  . Dimenhydrinate Other (See Comments)    "locks up kidneys"  . Morphine And Related Nausea And Vomiting  . Percocet [Oxycodone-Acetaminophen] Other (See Comments)    hallucinations  . Zolpidem Other (See Comments)    Unusual behavior    Family History  Adopted: Yes  Problem Relation Age of Onset  . Hypertension Brother     Social History:  reports that she quit smoking about 38 years ago. She has never used smokeless tobacco. She reports that she does not drink alcohol or use drugs.  ROS: A complete review of systems was performed.  All systems are negative except for pertinent findings as noted.  Physical Exam:  Vital signs in last 24 hours:   Constitutional:  Alert and oriented, No acute distress Cardiovascular: Regular rate and rhythm, No JVD Respiratory: Normal respiratory effort, Lungs clear bilaterally GI: Abdomen is soft, nontender, nondistended, no abdominal masses GU: No CVA tenderness Lymphatic: No lymphadenopathy Neurologic: Grossly intact, no focal deficits Psychiatric: Normal mood and affect  Laboratory Data:  No results for input(s): WBC, HGB, HCT, PLT in the last 72 hours.  No results for input(s): NA, K, CL, GLUCOSE, BUN, CALCIUM, CREATININE in the last 72 hours.  Invalid input(s): CO3   No results found for this or any previous visit (from the past 24 hour(s)). No results found for this or any previous visit (from the past 240 hour(s)).  Renal Function: Recent Labs    02/19/18 0759  CREATININE 0.88   Estimated Creatinine Clearance: 57.2 mL/min (by C-G formula based on SCr of 0.88 mg/dL).  Radiologic Imaging: No results found.  I independently reviewed the above imaging studies.  Assessment and Plan [name] Long is a 78 y.o. female with a  1.1 cm right lower pole renal mass with features concerning for RCC  -I reviewed imaging results and films with the patient personally. We discussed that the mass in question has features concerning for malignancy. I explained the natural history of presumed renal cell carcinoma. I reviewed the AUA guidelines for evaluation and treatment of the small renal mass. The options of active surveillance, in situ tumor ablation, partial and radical nephrectomy was discussed. The risks of robotic partial nephrectomy were discussed in detail including but not limited to: negative pathology, open conversion, completion nephrectomy, infection of the urinary tract/skin/abdominal cavity, VTE, MI/CVA, lymphatic leak, injury to adjacent solid/hollow viscus organs, bleeding requiring a blood transfusion, catastrophic bleeding, hernia formation, need for postoperative angioembolization, urinary leak requiring stent/drain, and other imponderables.   The patient voices understanding and wishes to proceed with robotic-assisted, laparoscopic right partial nephrectomy.   Ellison Hughs, MD 02/25/2018, 11:23 AM  [facility] Pager: [phone]

## 2018-02-25 NOTE — Progress Notes (Signed)
Anesthesia Chart Review   Case:  [id number] Date/Time:  02/27/18 1200   Procedure:  XI ROBOTIC ASSISTE PARTIAL NEPHRECTOMY (Right )   Anesthesia type:  General   Pre-op diagnosis:  RIGHT RENAL MASS   Location:  [facility] ROOM 03 / [facility]   Surgeon:  Ceasar Mons, MD      DISCUSSION:78 yo former smoker [name] Long [birth year]) with h/o PONV, asthma, fibromyalgia, HTN, GERD, CAD (MI s/p CABG 2013), right renal mass scheduled for above surgery 02/27/18 with Dr. Harrell Gave [name] Long.   Pt previously scheduled for planned procedure, rescheduled due to diagnosis of pneumonia 11/23/17. She has since completed antibiotics and is now asymptomatic.  Last seen by cardiologist 11/27/17.  Stable from cardiac standpoint at this visit.  Dr. Gwenlyn Found aware of upcoming surgery. Low risk stress test 12/2015, normal EF on Echo 11/2017.   Pt can proceed with planned procedure barring acute status change.  VS: BP 133/89   Pulse 78   Temp 36.4 C (Oral)   Resp 16   Ht 5' 4.5" (1.638 m)   Wt 82.6 kg   SpO2 94%   BMI 30.76 kg/m   PROVIDERS: [name] Lung, MD is PCP   Quay Burow, MD is Cardiologist  LABS: Labs reviewed: Acceptable for surgery. (all labs ordered are listed, but only abnormal results are displayed)  Labs Reviewed  BASIC METABOLIC PANEL - Abnormal; Notable for the following components:      Result Value   Potassium 5.6 (*)    Glucose, Bld 102 (*)    BUN 28 (*)    All other components within normal limits  CBC  TYPE AND SCREEN     IMAGES: Chest Xray 11/27/17 FINDINGS: Prior median sternotomy and CABG. Heart is normal size. Lungs are clear. No effusions. No acute bony abnormality.  IMPRESSION: No active cardiopulmonary disease.  EKG: 11/21/17 Rate 102 bpm Sinus tachycardia ST & T wave abnormality, consider inferior ischemia ST & T wave abnormality, consider anterolateral ischemia Abnormal ECG No significant change since last tracing  CV: Echo 11/20/17 Study  Conclusions  - Left ventricle: The cavity size was normal. There was mild   concentric hypertrophy. Systolic function was normal. The   estimated ejection fraction was in the range of 60% to 65%. Wall   motion was normal; there were no regional wall motion   abnormalities. Features are consistent with a pseudonormal left   ventricular filling pattern, with concomitant abnormal relaxation   and increased filling pressure (grade 2 diastolic dysfunction).   Doppler parameters are consistent with elevated ventricular   end-diastolic filling pressure. - Aortic root: The aortic root was normal in size. - Mitral valve: Calcified annulus. Mildly thickened leaflets .   There was mild regurgitation. - Right ventricle: The cavity size was normal. Wall thickness was   normal. Systolic function was normal. - Right atrium: The atrium was normal in size. - Tricuspid valve: There was mild regurgitation. - Pulmonary arteries: Systolic pressure was mildly increased. PA   peak pressure: 32 mm Hg (S). - Inferior vena cava: The vessel was normal in size. - Pericardium, extracardiac: There was no pericardial effusion.  Stress Test 12/28/2015  The left ventricular ejection fraction is normal (55-65%).  Nuclear stress EF: 57%.  Blood pressure demonstrated a normal response to exercise.  There was no ST segment deviation noted during stress.  The study is normal.  This is a low risk study. Past Medical History:  Diagnosis Date  . Abnormal EKG 01/12/2011  .  Allergy    RHINITIS  . Arthritis   . Asthma   . Asthma 01/12/2011  . Back pain, chronic--plans were for cervical disc surgery week of 01/16/11 01/12/2011  . Blood clot in vein 2004   L leg  . Blood transfusion without reported diagnosis 2004  . Chronic low back pain   . Complication of anesthesia   . Connective tissue disease ([place])   . Coronary artery disease   . Crescendo angina, with EKG changes, new. 01/12/2011  . Dyspnea   .  Fibromyalgia   . Fibromyalgia   . GERD (gastroesophageal reflux disease)   . Headache    migraine - several in a yr.   . History of stress test 04/2011   Normal stress test  . Hx of echocardiogram 07/19/2011   EF 50-55%. There is mild annular calcification, there is trace migtral regurgitation, there is trace tricupid regurgitation, pericardium is thick, there is no pericardial effusion.  . Hypertension   . Myocardial infarction [facility]) 2013   triple heart bypass  . Neuromuscular disorder (HCC)    fibromyalgia   . Osteoporosis    OSTEOPENIA  . Personal history of colonic polyp - adenoma 08/05/2013   08/2013 - 7 mm adenoma - consider repeat colonoscopy 2020 (age 83)  . Pneumonia    numerous times, only admitted one time for pneumonia but she has had pneumonia numerous times;   02-19-2018 END OF NOVEMBER 2019  WAS ADMITTED FOR ABOUT  A WEEK DUE TO "DOUBLE PNEUMONIA", REPORTS TODAY FEELING BACK TO HER NORMAL , DENIES SOB, O2 SAT AT 94 ROOM AIR , AFEBRILE, OTHER VSS   . PONV (postoperative nausea and vomiting)   . PPD positive   . Status post coronary artery bypass grafting   . Urinary incontinence    post op, "sleepy bladder", urinar retention     Past Surgical History:  Procedure Laterality Date  . ABDOMINAL HYSTERECTOMY  1981  . APPENDECTOMY  1946  . BLADDER REPAIR  1985  . BREAST SURGERY  1992   REDUCTION, x3 surgeries overall on breast for augmentation   . CARDIAC CATHETERIZATION  2013  . CARPAL TUNNEL RELEASE Bilateral 1988, 1999  . [place]  . CORONARY ARTERY BYPASS GRAFT  01/14/2011   Procedure: CORONARY ARTERY BYPASS GRAFTING (CABG);  Surgeon: Tharon Aquas Adelene Idler, MD;  Location: [place];  Service: Open Heart Surgery;  Laterality: N/A;  CABG x three, using right greater saphenous vein harvested endoscopically  . EYE SURGERY Bilateral 1985   radiokerototomy  . LEFT HEART CATHETERIZATION WITH CORONARY ANGIOGRAM N/A 01/13/2011   Procedure: LEFT HEART  CATHETERIZATION WITH CORONARY ANGIOGRAM;  Surgeon: Troy Sine, MD;  Location: [facility] C[facility] LAB;  Service: Cardiovascular;  Laterality: N/A;  . PUBOVAGINAL SLING  2002  . STERNAL WOUND DEBRIDEMENT  07/27/2011   Procedure: STERNAL WOUND DEBRIDEMENT;  followed by wound vac Surgeon: Ivin Poot, MD;  Location: [place];  Service: Open Heart Surgery;  Laterality: N/A;  . TONSILLECTOMY  1952  . TOTAL HIP ARTHROPLASTY Right 08/22/2016   Procedure: TOTAL HIP ARTHROPLASTY ANTERIOR APPROACH;  Surgeon: Melrose Nakayama, MD;  Location: [place];  Service: Orthopedics;  Laterality: Right;  . TRIGGER FINGER RELEASE Right 2014   4th finger  . TROCHANTERIC BURSA EXCISION Right    bursa removed, R hip  . TUBAL LIGATION      MEDICATIONS: . ADVAIR DISKUS 500-50 MCG/DOSE AEPB  . albuterol (PROVENTIL HFA) 108 (90 Base) MCG/ACT  inhaler  . aspirin EC 81 MG tablet  . atorvastatin (LIPITOR) 20 MG tablet  . Calcium Carbonate-Vitamin D (CALCIUM-D PO)  . carisoprodol (SOMA) 350 MG tablet  . gabapentin (NEURONTIN) 600 MG tablet  . HYDROmorphone (DILAUDID) 4 MG tablet  . hydroxychloroquine (PLAQUENIL) 200 MG tablet  . metoprolol tartrate (LOPRESSOR) 25 MG tablet  . montelukast (SINGULAIR) 10 MG tablet  . Multiple Vitamins-Minerals (PRESERVISION AREDS 2 PO)  . silodosin (RAPAFLO) 8 MG CAPS capsule   No current facility-administered medications for this encounter.     Maia Plan [facility] Pre-Surgical Testing [phone] 02/25/18 12:44 PM

## 2018-02-25 NOTE — Anesthesia Preprocedure Evaluation (Addendum)
Anesthesia Evaluation  Patient identified by MRN, date of birth, ID band Patient awake    Reviewed: Allergy & Precautions, H&P , NPO status , Patient's Chart, lab work & pertinent test results  History of Anesthesia Complications (+) PONV and history of anesthetic complications  Airway Mallampati: II   Neck ROM: full    Dental   Pulmonary shortness of breath, asthma , former smoker,    breath sounds clear to auscultation       Cardiovascular hypertension, + CAD, + Past MI and + CABG   Rhythm:regular Rate:Normal     Neuro/Psych  Headaches,  Neuromuscular disease    GI/Hepatic GERD  ,  Endo/Other    Renal/GU      Musculoskeletal  (+) Arthritis , Fibromyalgia -  Abdominal   Peds  Hematology   Anesthesia Other Findings   Reproductive/Obstetrics                            Anesthesia Physical Anesthesia Plan  ASA: III  Anesthesia Plan: General   Post-op Pain Management:    Induction: Intravenous  PONV Risk Score and Plan: 4 or greater and Dexamethasone, Ondansetron and Treatment may vary due to age or medical condition  Airway Management Planned: Oral ETT  Additional Equipment: Arterial line  Intra-op Plan:   Post-operative Plan: Extubation in OR  Informed Consent: I have reviewed the patients History and Physical, chart, labs and discussed the procedure including the risks, benefits and alternatives for the proposed anesthesia with the patient or authorized representative who has indicated his/her understanding and acceptance.       Plan Discussed with: CRNA, Anesthesiologist and Surgeon  Anesthesia Plan Comments: (See PAT note 02/19/18, Konrad Felix, PA-C)       Anesthesia Quick Evaluation

## 2018-02-27 ENCOUNTER — Encounter (HOSPITAL_COMMUNITY): Payer: Self-pay | Admitting: Anesthesiology

## 2018-02-27 ENCOUNTER — Encounter (HOSPITAL_COMMUNITY)
Admission: RE | Disposition: A | Discharge status: Home / Self Care | Payer: Self-pay | Source: Home / Self Care | Attending: Urology

## 2018-02-27 ENCOUNTER — Ambulatory Visit (HOSPITAL_COMMUNITY): Payer: PPO | Admitting: Anesthesiology

## 2018-02-27 ENCOUNTER — Observation Stay (HOSPITAL_COMMUNITY)
Admission: RE | Admit: 2018-02-27 | Discharge: 2018-02-28 | Disposition: A | Discharge status: Home / Self Care | Payer: PPO | Attending: Urology | Admitting: Urology

## 2018-02-27 ENCOUNTER — Ambulatory Visit (HOSPITAL_COMMUNITY): Payer: PPO | Admitting: Physician Assistant

## 2018-02-27 ENCOUNTER — Other Ambulatory Visit: Payer: Self-pay

## 2018-02-27 DIAGNOSIS — Z951 Presence of aortocoronary bypass graft: Secondary | ICD-10-CM | POA: Diagnosis not present

## 2018-02-27 DIAGNOSIS — Z96641 Presence of right artificial hip joint: Secondary | ICD-10-CM | POA: Diagnosis not present

## 2018-02-27 DIAGNOSIS — C641 Malignant neoplasm of right kidney, except renal pelvis: Secondary | ICD-10-CM | POA: Diagnosis not present

## 2018-02-27 DIAGNOSIS — R0602 Shortness of breath: Secondary | ICD-10-CM | POA: Diagnosis not present

## 2018-02-27 DIAGNOSIS — I2510 Atherosclerotic heart disease of native coronary artery without angina pectoris: Secondary | ICD-10-CM | POA: Diagnosis not present

## 2018-02-27 DIAGNOSIS — Z87891 Personal history of nicotine dependence: Secondary | ICD-10-CM | POA: Diagnosis not present

## 2018-02-27 DIAGNOSIS — I252 Old myocardial infarction: Secondary | ICD-10-CM | POA: Diagnosis not present

## 2018-02-27 DIAGNOSIS — I10 Essential (primary) hypertension: Secondary | ICD-10-CM | POA: Diagnosis not present

## 2018-02-27 DIAGNOSIS — N2889 Other specified disorders of kidney and ureter: Secondary | ICD-10-CM | POA: Diagnosis not present

## 2018-02-27 DIAGNOSIS — D4101 Neoplasm of uncertain behavior of right kidney: Secondary | ICD-10-CM | POA: Diagnosis not present

## 2018-02-27 DIAGNOSIS — I25810 Atherosclerosis of coronary artery bypass graft(s) without angina pectoris: Secondary | ICD-10-CM | POA: Diagnosis not present

## 2018-02-27 HISTORY — PX: ROBOT ASSISTED LAPAROSCOPIC NEPHRECTOMY: SHX5140

## 2018-02-27 LAB — HEMOGLOBIN AND HEMATOCRIT, BLOOD
HCT: 36.2 % (ref 36.0–46.0)
Hemoglobin: 11.1 g/dL — ABNORMAL LOW (ref 12.0–15.0)

## 2018-02-27 LAB — POCT I-STAT 4, (NA,K, GLUC, HGB,HCT)
Glucose, Bld: 99 mg/dL (ref 70–99)
HCT: 34 % — ABNORMAL LOW (ref 36.0–46.0)
Hemoglobin: 11.6 g/dL — ABNORMAL LOW (ref 12.0–15.0)
POTASSIUM: 3.8 mmol/L (ref 3.5–5.1)
Sodium: 139 mmol/L (ref 135–145)

## 2018-02-27 LAB — TYPE AND SCREEN
ABO/RH(D): O POS
ANTIBODY SCREEN: NEGATIVE

## 2018-02-27 SURGERY — NEPHRECTOMY, RADICAL, ROBOT-ASSISTED, LAPAROSCOPIC, ADULT
Anesthesia: General | Laterality: Right

## 2018-02-27 MED ORDER — HYDROXYCHLOROQUINE SULFATE 200 MG PO TABS
200.0000 mg | ORAL_TABLET | ORAL | Status: DC
  Filled 2018-02-27: qty 1

## 2018-02-27 MED ORDER — SUGAMMADEX SODIUM 200 MG/2ML IV SOLN
INTRAVENOUS | Status: AC
  Filled 2018-02-27: qty 2

## 2018-02-27 MED ORDER — STERILE WATER FOR IRRIGATION IR SOLN
Status: DC | PRN
  Administered 2018-02-27: 1000 mL

## 2018-02-27 MED ORDER — ACETAMINOPHEN 325 MG PO TABS: 650.0000 mg | ORAL_TABLET | ORAL | Status: DC | PRN

## 2018-02-27 MED ORDER — ROCURONIUM BROMIDE 10 MG/ML (PF) SYRINGE
PREFILLED_SYRINGE | INTRAVENOUS | Status: DC | PRN
  Administered 2018-02-27 (×2): 20 mg via INTRAVENOUS
  Administered 2018-02-27: 60 mg via INTRAVENOUS
  Administered 2018-02-27: 10 mg via INTRAVENOUS

## 2018-02-27 MED ORDER — MIDAZOLAM HCL 5 MG/5ML IJ SOLN
INTRAMUSCULAR | Status: DC | PRN
  Administered 2018-02-27: 2 mg via INTRAVENOUS

## 2018-02-27 MED ORDER — HYDROMORPHONE HCL 4 MG PO TABS: 4.0000 mg | ORAL_TABLET | ORAL | 0 refills | Status: AC | PRN

## 2018-02-27 MED ORDER — SODIUM CHLORIDE 0.9 % IV SOLN
INTRAVENOUS | Status: DC | PRN
  Administered 2018-02-27: 25 ug/min via INTRAVENOUS

## 2018-02-27 MED ORDER — ATORVASTATIN CALCIUM 20 MG PO TABS
20.0000 mg | ORAL_TABLET | Freq: Every day | ORAL | Status: DC
  Administered 2018-02-27: 20 mg via ORAL
  Filled 2018-02-27: qty 1

## 2018-02-27 MED ORDER — ONDANSETRON HCL 4 MG/2ML IJ SOLN
INTRAMUSCULAR | Status: AC
  Filled 2018-02-27: qty 2

## 2018-02-27 MED ORDER — FENTANYL CITRATE (PF) 100 MCG/2ML IJ SOLN
INTRAMUSCULAR | Status: AC
  Filled 2018-02-27: qty 2

## 2018-02-27 MED ORDER — SODIUM CHLORIDE 0.9 % IV SOLN
INTRAVENOUS | Status: DC | PRN
  Administered 2018-02-27: 12:00:00 via INTRAVENOUS

## 2018-02-27 MED ORDER — FENTANYL CITRATE (PF) 100 MCG/2ML IJ SOLN
INTRAMUSCULAR | Status: DC | PRN
  Administered 2018-02-27: 100 ug via INTRAVENOUS
  Administered 2018-02-27 (×2): 50 ug via INTRAVENOUS

## 2018-02-27 MED ORDER — ROCURONIUM BROMIDE 100 MG/10ML IV SOLN
INTRAVENOUS | Status: AC
  Filled 2018-02-27: qty 1

## 2018-02-27 MED ORDER — CARISOPRODOL 350 MG PO TABS: 350.0000 mg | ORAL_TABLET | Freq: Three times a day (TID) | ORAL | Status: DC | PRN

## 2018-02-27 MED ORDER — METOPROLOL TARTRATE 25 MG PO TABS
25.0000 mg | ORAL_TABLET | Freq: Two times a day (BID) | ORAL | Status: DC
  Administered 2018-02-27 – 2018-02-28 (×2): 25 mg via ORAL
  Filled 2018-02-27 (×2): qty 1

## 2018-02-27 MED ORDER — LACTATED RINGERS IV SOLN
INTRAVENOUS | Status: DC
  Administered 2018-02-27 (×4): via INTRAVENOUS

## 2018-02-27 MED ORDER — DEXTROSE-NACL 5-0.45 % IV SOLN
INTRAVENOUS | Status: DC
  Administered 2018-02-27 – 2018-02-28 (×2): via INTRAVENOUS

## 2018-02-27 MED ORDER — STERILE WATER FOR INJECTION IJ SOLN
INTRAMUSCULAR | Status: AC
  Filled 2018-02-27: qty 20

## 2018-02-27 MED ORDER — ALBUTEROL SULFATE (2.5 MG/3ML) 0.083% IN NEBU: 2.5000 mg | INHALATION_SOLUTION | Freq: Four times a day (QID) | RESPIRATORY_TRACT | Status: DC | PRN

## 2018-02-27 MED ORDER — MIDAZOLAM HCL 2 MG/2ML IJ SOLN
INTRAMUSCULAR | Status: AC
  Filled 2018-02-27: qty 2

## 2018-02-27 MED ORDER — PHENYLEPHRINE 40 MCG/ML (10ML) SYRINGE FOR IV PUSH (FOR BLOOD PRESSURE SUPPORT)
PREFILLED_SYRINGE | INTRAVENOUS | Status: AC
  Filled 2018-02-27: qty 10

## 2018-02-27 MED ORDER — ONDANSETRON HCL 4 MG/2ML IJ SOLN
INTRAMUSCULAR | Status: DC | PRN
  Administered 2018-02-27 (×2): 4 mg via INTRAVENOUS

## 2018-02-27 MED ORDER — LIDOCAINE 2% (20 MG/ML) 5 ML SYRINGE
INTRAMUSCULAR | Status: DC | PRN
  Administered 2018-02-27: 100 mg via INTRAVENOUS

## 2018-02-27 MED ORDER — SUGAMMADEX SODIUM 500 MG/5ML IV SOLN
INTRAVENOUS | Status: DC | PRN
  Administered 2018-02-27: 200 mg via INTRAVENOUS
  Administered 2018-02-27: 50 mg via INTRAVENOUS

## 2018-02-27 MED ORDER — EPHEDRINE SULFATE-NACL 50-0.9 MG/10ML-% IV SOSY
PREFILLED_SYRINGE | INTRAVENOUS | Status: DC | PRN
  Administered 2018-02-27 (×2): 5 mg via INTRAVENOUS

## 2018-02-27 MED ORDER — PROPOFOL 10 MG/ML IV BOLUS
INTRAVENOUS | Status: DC | PRN
  Administered 2018-02-27: 200 mg via INTRAVENOUS

## 2018-02-27 MED ORDER — PROPOFOL 10 MG/ML IV BOLUS
INTRAVENOUS | Status: AC
  Filled 2018-02-27: qty 40

## 2018-02-27 MED ORDER — ESMOLOL HCL 100 MG/10ML IV SOLN
INTRAVENOUS | Status: DC | PRN
  Administered 2018-02-27 (×3): 20 mg via INTRAVENOUS
  Administered 2018-02-27: 10 mg via INTRAVENOUS

## 2018-02-27 MED ORDER — EVICEL 5 ML EX KIT
PACK | Freq: Once | CUTANEOUS | Status: AC
  Administered 2018-02-27: 5 mL
  Filled 2018-02-27: qty 1

## 2018-02-27 MED ORDER — BELLADONNA ALKALOIDS-OPIUM 16.2-60 MG RE SUPP: 1.0000 | Freq: Four times a day (QID) | RECTAL | Status: DC | PRN

## 2018-02-27 MED ORDER — CEFAZOLIN SODIUM-DEXTROSE 2-4 GM/100ML-% IV SOLN
2.0000 g | Freq: Once | INTRAVENOUS | Status: AC
  Administered 2018-02-27: 2 g via INTRAVENOUS
  Filled 2018-02-27: qty 100

## 2018-02-27 MED ORDER — LACTATED RINGERS IR SOLN
Status: DC | PRN
  Administered 2018-02-27: 1000 mL

## 2018-02-27 MED ORDER — SODIUM CHLORIDE (PF) 0.9 % IJ SOLN
INTRAMUSCULAR | Status: AC
  Filled 2018-02-27: qty 20

## 2018-02-27 MED ORDER — HYDROMORPHONE HCL 1 MG/ML IJ SOLN: 0.5000 mg | INTRAMUSCULAR | Status: DC | PRN

## 2018-02-27 MED ORDER — HYDROMORPHONE HCL 2 MG PO TABS: 4.0000 mg | ORAL_TABLET | ORAL | Status: DC | PRN

## 2018-02-27 MED ORDER — ONDANSETRON HCL 4 MG PO TABS: 4.0000 mg | ORAL_TABLET | Freq: Three times a day (TID) | ORAL | 0 refills | Status: AC | PRN

## 2018-02-27 MED ORDER — TRAMADOL HCL 50 MG PO TABS: 50.0000 mg | ORAL_TABLET | Freq: Four times a day (QID) | ORAL | Status: DC | PRN

## 2018-02-27 MED ORDER — EPHEDRINE 5 MG/ML INJ
INTRAVENOUS | Status: AC
  Filled 2018-02-27: qty 10

## 2018-02-27 MED ORDER — DOCUSATE SODIUM 100 MG PO CAPS: 100.0000 mg | ORAL_CAPSULE | Freq: Two times a day (BID) | ORAL | Status: DC

## 2018-02-27 MED ORDER — MOMETASONE FURO-FORMOTEROL FUM 200-5 MCG/ACT IN AERO
2.0000 | INHALATION_SPRAY | Freq: Two times a day (BID) | RESPIRATORY_TRACT | Status: DC
  Administered 2018-02-27 – 2018-02-28 (×2): 2 via RESPIRATORY_TRACT
  Filled 2018-02-27: qty 8.8

## 2018-02-27 MED ORDER — PANTOPRAZOLE SODIUM 40 MG PO TBEC
40.0000 mg | DELAYED_RELEASE_TABLET | Freq: Every day | ORAL | Status: DC
  Administered 2018-02-27 – 2018-02-28 (×2): 40 mg via ORAL
  Filled 2018-02-27 (×2): qty 1

## 2018-02-27 MED ORDER — GABAPENTIN 400 MG PO CAPS
1200.0000 mg | ORAL_CAPSULE | Freq: Three times a day (TID) | ORAL | Status: DC
  Administered 2018-02-27 – 2018-02-28 (×2): 1200 mg via ORAL
  Filled 2018-02-27 (×3): qty 3

## 2018-02-27 MED ORDER — MONTELUKAST SODIUM 10 MG PO TABS
10.0000 mg | ORAL_TABLET | Freq: Every day | ORAL | Status: DC
  Administered 2018-02-27: 10 mg via ORAL
  Filled 2018-02-27: qty 1

## 2018-02-27 MED ORDER — HYDROMORPHONE HCL 2 MG PO TABS
4.0000 mg | ORAL_TABLET | ORAL | Status: DC | PRN
  Administered 2018-02-28: 4 mg via ORAL
  Filled 2018-02-27: qty 2

## 2018-02-27 MED ORDER — PROPOFOL 10 MG/ML IV BOLUS
INTRAVENOUS | Status: AC
  Filled 2018-02-27: qty 20

## 2018-02-27 MED ORDER — FENTANYL CITRATE (PF) 100 MCG/2ML IJ SOLN
INTRAMUSCULAR | Status: AC
  Administered 2018-02-27: 25 ug via INTRAVENOUS
  Filled 2018-02-27: qty 2

## 2018-02-27 MED ORDER — FENTANYL CITRATE (PF) 100 MCG/2ML IJ SOLN
INTRAMUSCULAR | Status: AC
  Administered 2018-02-27: 50 ug via INTRAVENOUS
  Filled 2018-02-27: qty 2

## 2018-02-27 MED ORDER — PHENYLEPHRINE 40 MCG/ML (10ML) SYRINGE FOR IV PUSH (FOR BLOOD PRESSURE SUPPORT)
PREFILLED_SYRINGE | INTRAVENOUS | Status: DC | PRN
  Administered 2018-02-27 (×3): 80 ug via INTRAVENOUS

## 2018-02-27 MED ORDER — ONDANSETRON HCL 4 MG/2ML IJ SOLN: 4.0000 mg | INTRAMUSCULAR | Status: DC | PRN

## 2018-02-27 MED ORDER — FENTANYL CITRATE (PF) 100 MCG/2ML IJ SOLN
25.0000 ug | INTRAMUSCULAR | Status: DC | PRN
  Administered 2018-02-27: 25 ug via INTRAVENOUS
  Administered 2018-02-27: 50 ug via INTRAVENOUS
  Administered 2018-02-27: 25 ug via INTRAVENOUS
  Administered 2018-02-27: 50 ug via INTRAVENOUS

## 2018-02-27 MED ORDER — TAMSULOSIN HCL 0.4 MG PO CAPS
0.4000 mg | ORAL_CAPSULE | Freq: Every day | ORAL | Status: DC
  Administered 2018-02-27 – 2018-02-28 (×2): 0.4 mg via ORAL
  Filled 2018-02-27 (×2): qty 1

## 2018-02-27 MED ORDER — DEXAMETHASONE SODIUM PHOSPHATE 10 MG/ML IJ SOLN
INTRAMUSCULAR | Status: DC | PRN
  Administered 2018-02-27: 10 mg via INTRAVENOUS

## 2018-02-27 MED ORDER — GABAPENTIN 600 MG PO TABS
1200.0000 mg | ORAL_TABLET | Freq: Three times a day (TID) | ORAL | Status: DC
  Filled 2018-02-27: qty 2

## 2018-02-27 MED ORDER — ONDANSETRON HCL 4 MG/2ML IJ SOLN: 4.0000 mg | Freq: Four times a day (QID) | INTRAMUSCULAR | Status: DC | PRN

## 2018-02-27 MED ORDER — BUPIVACAINE LIPOSOME 1.3 % IJ SUSP
20.0000 mL | Freq: Once | INTRAMUSCULAR | Status: AC
  Administered 2018-02-27: 20 mL
  Filled 2018-02-27: qty 20

## 2018-02-27 SURGICAL SUPPLY — 52 items
BAG LAPAROSCOPIC 12 15 PORT 16 (BASKET) IMPLANT
BAG RETRIEVAL 12/15 (BASKET)
CHLORAPREP W/TINT 26ML (MISCELLANEOUS) ×2 IMPLANT
CLIP SUT LAPRA TY ABSORB (SUTURE) ×4 IMPLANT
CLIP VESOLOCK LG 6/CT PURPLE (CLIP) ×2 IMPLANT
CLIP VESOLOCK MED LG 6/CT (CLIP) ×6 IMPLANT
CLIP VESOLOCK XL 6/CT (CLIP) IMPLANT
COVER SURGICAL LIGHT HANDLE (MISCELLANEOUS) ×2 IMPLANT
COVER TIP SHEARS 8 DVNC (MISCELLANEOUS) ×1 IMPLANT
COVER TIP SHEARS 8MM DA VINCI (MISCELLANEOUS) ×1
COVER WAND RF STERILE (DRAPES) IMPLANT
DECANTER SPIKE VIAL GLASS SM (MISCELLANEOUS) ×2 IMPLANT
DRAPE ARM DVNC X/XI (DISPOSABLE) ×4 IMPLANT
DRAPE COLUMN DVNC XI (DISPOSABLE) ×1 IMPLANT
DRAPE DA VINCI XI ARM (DISPOSABLE) ×4
DRAPE DA VINCI XI COLUMN (DISPOSABLE) ×1
DRAPE INCISE IOBAN 66X45 STRL (DRAPES) ×2 IMPLANT
DRAPE SHEET LG 3/4 BI-LAMINATE (DRAPES) ×2 IMPLANT
ELECT PENCIL ROCKER SW 15FT (MISCELLANEOUS) ×2 IMPLANT
ELECT REM PT RETURN 15FT ADLT (MISCELLANEOUS) ×2 IMPLANT
GLOVE BIO SURGEON STRL SZ 6.5 (GLOVE) ×2 IMPLANT
GLOVE BIOGEL M STRL SZ7.5 (GLOVE) ×4 IMPLANT
GOWN STRL REUS W/TWL LRG LVL3 (GOWN DISPOSABLE) ×4 IMPLANT
GOWN STRL REUS W/TWL XL LVL3 (GOWN DISPOSABLE) ×6 IMPLANT
HEMOSTAT SURGICEL 4X8 (HEMOSTASIS) ×2 IMPLANT
IRRIG SUCT STRYKERFLOW 2 WTIP (MISCELLANEOUS) ×2
IRRIGATION SUCT STRKRFLW 2 WTP (MISCELLANEOUS) ×1 IMPLANT
KIT BASIN OR (CUSTOM PROCEDURE TRAY) ×2 IMPLANT
NEEDLE INSUFFLATION 14GA 120MM (NEEDLE) ×2 IMPLANT
PORT ACCESS TROCAR AIRSEAL 12 (TROCAR) ×1 IMPLANT
PORT ACCESS TROCAR AIRSEAL 5M (TROCAR) ×1
POUCH SPECIMEN RETRIEVAL 10MM (ENDOMECHANICALS) ×2 IMPLANT
PROTECTOR NERVE ULNAR (MISCELLANEOUS) ×4 IMPLANT
RELOAD STAPLER WHITE 60MM (STAPLE) IMPLANT
SEAL CANN UNIV 5-8 DVNC XI (MISCELLANEOUS) ×4 IMPLANT
SEAL XI 5MM-8MM UNIVERSAL (MISCELLANEOUS) ×4
SET TRI-LUMEN FLTR TB AIRSEAL (TUBING) ×2 IMPLANT
SOLUTION ELECTROLUBE (MISCELLANEOUS) ×2 IMPLANT
STAPLE ECHEON FLEX 60 POW ENDO (STAPLE) IMPLANT
STAPLER RELOAD WHITE 60MM (STAPLE)
SUT MNCRL AB 4-0 PS2 18 (SUTURE) ×4 IMPLANT
SUT VIC AB 1 CT1 27 (SUTURE) ×4
SUT VIC AB 1 CT1 27XBRD ANTBC (SUTURE) ×4 IMPLANT
SUT VICRYL 0 UR6 27IN ABS (SUTURE) ×2 IMPLANT
SUT VLOC BARB 180 ABS3/0GR12 (SUTURE) ×6
SUTURE VLOC BRB 180 ABS3/0GR12 (SUTURE) ×3 IMPLANT
TOWEL OR 17X26 10 PK STRL BLUE (TOWEL DISPOSABLE) ×2 IMPLANT
TOWEL OR NON WOVEN STRL DISP B (DISPOSABLE) ×2 IMPLANT
TRAY FOLEY MTR SLVR 14FR STAT (SET/KITS/TRAYS/PACK) ×2 IMPLANT
TRAY LAPAROSCOPIC (CUSTOM PROCEDURE TRAY) ×2 IMPLANT
TROCAR BLADELESS OPT 5 100 (ENDOMECHANICALS) ×2 IMPLANT
WATER STERILE IRR 1000ML POUR (IV SOLUTION) IMPLANT

## 2018-02-27 NOTE — Transfer of Care (Signed)
Immediate Anesthesia Transfer of Care Note  [name]  Procedure(s) Performed: XI ROBOTIC ASSISTE PARTIAL NEPHRECTOMY (Right )  Patient Location: PACU  Anesthesia Type:General  Level of Consciousness: awake, alert , oriented and patient cooperative  Airway & Oxygen Therapy: Patient Spontanous Breathing and Patient connected to face mask oxygen  Post-op Assessment: Report given to RN and Post -op Vital signs reviewed and stable  Post vital signs: Reviewed and stable  Last Vitals:  Vitals Value Taken Time  BP 133/74 02/27/2018  4:00 PM  Temp    Pulse 93 02/27/2018  3:57 PM  Resp 15 02/27/2018  4:02 PM  SpO2 100 % 02/27/2018  3:57 PM  Vitals shown include unvalidated device data.  Last Pain:  Vitals:   02/27/18 1040  TempSrc: Oral         Complications: No apparent anesthesia complications

## 2018-02-27 NOTE — Op Note (Addendum)
Operative Note  Preoperative diagnosis:  1.  1.1 cm solid and enhancing right renal mass  Postoperative diagnosis: 1.  Same  Procedure(s): 1.  Robot-assisted laparoscopic right partial nephrectomy  Surgeon: Ellison Hughs, MD  Assistants:  1.  Debbrah Alar, [facility] An assistant was required for this surgical procedure.  The duties of the assistant included but were not limited to suctioning, passing suture, camera manipulation, retraction.  This procedure would not be able to be performed without an Environmental consultant.  2.  Basilio Cairo, MD PGY4  Anesthesia:  General  Complications:  None  EBL: 200 mL  Fluids: 1700 mL crystalloid  Specimens: 1.  Right renal mass  Drains/Catheters: 1.  Foley catheter  Intraoperative findings:   1. 1 cm solid and cystic right lower pole renal mass 2. The renorrhaphy was hemostatic at the conclusion of the repair  Indication:  [name] Long is a 78 y.o. female with a solid and enhancing 1.1 cm right renal mass recent CT abdomen/pelvis with and without contrast from 10/03/17. The scan was initially ordered due to ongoing abdominal pain after the patient bluntly impaled herself in the abdomen with a wheelbarrow handle.  The patient has been consented for the above procedures, voices understanding and wishes to proceed.  Description of procedure:  After informed consent was signed, the patient was taken back to the operating room and properly anesthetized.  The patient was then placed in the left lateral decubitus position with all pressure points padded.  The abdomen was then prepped and draped in the usual sterile fashion.  A time-out was then performed.    An 8 mm incision was then made lateral to the right rectus muscle at the level of the right 12th rib.  A Veress needle was then used to access the abdominal cavity.  A saline drop test showed no signs of obstruction and aspiration of the Veress needle revealed no blood or sucus.  The abdominal cavity  was then insufflated to 15 mmHg.  An 8 mm robotic trocar was then atraumatically inserted into the abdominal cavity.  The robotic camera was then inserted through the port and inspection of the abdominal cavity revealed no evidence of adjacent organ or vessel injury.  We then placed three additional 8 mm robotic ports, a 15 mm assistant port and a 5 mm subxiphoid port in such as fashion as to triangulate the right renal hilum.  The robot was then docked into postion.   Using a combination of blunt and cold scissors dissection, the hepatic attachments were released from the abdominal sidewall.  A locking grasper was then inserted through the 5 mm sub-xyphoid port and used to retract the posterior surface of the liver more cephalad.  The white line of Toldt along the ascending colon was then incised, allowing [place] to reflect the colon medially and expose the anterior surface of the right kidney.  The duodenum was then Kocherized medially, which abruptly led [place] to identification of the inferior vena cava.    Once the colon was adequately mobilized, we moved to the lower pole and identified the gonadal vein and ureter.  The gonadal vein was then left running parallel to the vena cava and the right ureter was reflected anteriorly.  Using cautious cautery, the overlying perihilar attachments were then released.  This yielded visualization of the renal hilum, which included a single right renal vein and a single right renal artery.  The perilymphatic tissue surrounding the right renal artery were carefully released so  that the right renal artery was fully encircled.    We next turned our attention to defatting the kidney.  An anterior incision along Gerota's fascia was created and the kidney was fully mobilized.   The renal mass is identified at the posterior right lower pole.  Using intraoperative ultrasound, the tumor was then carefully evaluated and demonstrated heterogenous echogenicity compared to the rest of the  renal parenchyma. The resection margin was marked to allow for wide excision of the renal mass.  The renal hilum was once again identified and a bulldog clamp was placed.    Using cold scissors, the right renal mass was then carefully excised, leaving a grossly negative margin.  Excision of the mass appeared complete with no tumor grossly remaining.  The tumor was then placed in the right lower quadrant, to be retrieved following repair of the renal defect.  A running 3-0 V lock suture was then used to reapproximate the resection bed.  Tension was placed with hemo-lock clips.   The right renal capsule was then reapproximated using a 0 Vicryl suture on a CT-1 needle in an interrupted fashion using hemo-lock clips as a buttress.  The bulldog was then released, which warm ischemia time totaled 13 minutes.   Once hemostasis was achieved and the renal bed was irrigated, Surgicel and EVICEL was then placed over the renorrhaphy.  Gerota's fascia was then reapproximated with a running v-lok suture and the mesocolonic fat along the descending colon was then reapproximated to the right abdominal sidewall using Hem-o-lok clips.  The renal mass was retrieved and placed in an Endo Catch bag.  The mass was extracted through the 15 mm assistant port.  The fascia of the assistant port was then reapproximated with a 0 Vicryl suture.    The abdomen was desufflated with all ports removed.  The skin was then reapproximated using running Monocryl and dressed appropriately.  The patient was then replaced in the supine position and was awakened from anesthesia without complications.  Warm Ischemia Time: 13 minutes  Plan: Bedrest and clear liquid diet overnight.  Foley out in the morning.  Advance diet as tolerated tomorrow.

## 2018-02-27 NOTE — Discharge Instructions (Signed)

## 2018-02-27 NOTE — Anesthesia Procedure Notes (Signed)
Arterial Line Insertion Start/End2/26/2020 12:20 PM, 02/27/2018 12:33 PM Performed by: Albertha Ghee, MD, anesthesiologist  Patient location: OR. Preanesthetic checklist: patient identified, IV checked, site marked, risks and benefits discussed, surgical consent, monitors and equipment checked, pre-op evaluation, timeout performed and anesthesia consent Patient sedated Left, radial was placed Catheter size: 20 G Maximum sterile barriers used   Attempts: 1 Procedure performed without using ultrasound guided technique. Following insertion, dressing applied and Biopatch. Post procedure assessment: normal  Patient tolerated the procedure well with no immediate complications.

## 2018-02-27 NOTE — Anesthesia Procedure Notes (Signed)
Procedure Name: Intubation Date/Time: 02/27/2018 12:36 PM Performed by: Lavina Hamman, CRNA Pre-anesthesia Checklist: Patient identified, Emergency Drugs available, Suction available, Patient being monitored and Timeout performed Patient Re-evaluated:Patient Re-evaluated prior to induction Oxygen Delivery Method: Circle system utilized Preoxygenation: Pre-oxygenation with 100% oxygen Induction Type: IV induction Ventilation: Mask ventilation without difficulty Laryngoscope Size: Mac and 3 Grade View: Grade II Tube type: Oral Tube size: 7.0 mm Number of attempts: 2 Airway Equipment and Method: Stylet and Bougie stylet Placement Confirmation: ETT inserted through vocal cords under direct vision,  positive ETCO2,  CO2 detector and breath sounds checked- equal and bilateral Secured at: 22 cm Tube secured with: Tape Dental Injury: Teeth and Oropharynx as per pre-operative assessment  Comments: ATOI, bougie by A Hodierne.

## 2018-02-28 ENCOUNTER — Encounter (HOSPITAL_COMMUNITY): Payer: Self-pay | Admitting: Urology

## 2018-02-28 DIAGNOSIS — C641 Malignant neoplasm of right kidney, except renal pelvis: Secondary | ICD-10-CM | POA: Diagnosis not present

## 2018-02-28 LAB — BASIC METABOLIC PANEL
Anion gap: 6 (ref 5–15)
BUN: 23 mg/dL (ref 8–23)
CO2: 24 mmol/L (ref 22–32)
Calcium: 8 mg/dL — ABNORMAL LOW (ref 8.9–10.3)
Chloride: 108 mmol/L (ref 98–111)
Creatinine, Ser: 0.95 mg/dL (ref 0.44–1.00)
GFR calc non Af Amer: 58 mL/min — ABNORMAL LOW (ref >60)
Glucose, Bld: 227 mg/dL — ABNORMAL HIGH (ref 70–99)
Potassium: 4.4 mmol/L (ref 3.5–5.1)
Sodium: 138 mmol/L (ref 135–145)

## 2018-02-28 LAB — HEMOGLOBIN AND HEMATOCRIT, BLOOD
HCT: 33.4 % — ABNORMAL LOW (ref 36.0–46.0)
Hemoglobin: 10.4 g/dL — ABNORMAL LOW (ref 12.0–15.0)

## 2018-02-28 MED ORDER — SENNOSIDES-DOCUSATE SODIUM 8.6-50 MG PO TABS: 1.0000 | ORAL_TABLET | Freq: Every day | ORAL | 0 refills | Status: DC

## 2018-02-28 MED ORDER — TRAMADOL HCL 50 MG PO TABS: 50.0000 mg | ORAL_TABLET | Freq: Four times a day (QID) | ORAL | 0 refills | Status: AC | PRN

## 2018-02-28 NOTE — Discharge Summary (Addendum)
[facility] Discharge Summary  Admit date: 02/27/2018  Discharge date and time: 02/28/18   Discharge to: Home  Discharge Service: Urology  Discharge Attending Physician:  Dr. [name]  Discharge  Diagnoses: Right renal mass  Secondary Diagnosis: Active Problems:   Renal mass   OR Procedures: Procedure(s): XI ROBOTIC ASSISTE PARTIAL NEPHRECTOMY 02/27/2018   Ancillary Procedures: None   Discharge Day Services: The patient was seen and examined by the Urology team both in the morning and immediately prior to discharge.  Vital signs and laboratory values were stable and within normal limits.  The physical exam was benign and unchanged and all surgical wounds were examined.  Discharge instructions were explained and all questions answered.  Subjective  No acute events overnight. Pain Controlled. No fever or chills.  Objective Patient Vitals for the past 8 hrs:  BP Temp Temp src Pulse Resp SpO2  02/28/18 0937 123/65 - - 96 18 94 %  02/28/18 0922 130/79 98.1 F (36.7 C) Oral 92 18 92 %  02/28/18 0834 - - - - - 90 %  02/28/18 0647 97/61 98.6 F (37 C) Oral 81 18 92 %   Total I/O In: 550.3 [P.O.:360; I.V.:190.3] Out: 1900 [Urine:1900]  General Appearance:        No acute distress Lungs:                       Normal work of breathing on room air Heart:                                Regular rate and rhythm Abdomen:                         Soft, non-tender, non-distended. Port incisions clean dry and intact Extremities:                      Warm and well perfused   Hospital Course:  The patient underwent a Right robotic partial nephrectomy on 02/27/2018.  The patient tolerated the procedure well, was extubated in the OR, and afterwards was taken to the PACU for routine post-surgical care. When stable the patient was transferred to the floor.   The patient did well postoperatively.  The patient's diet was slowly advanced and at the time of discharge was tolerating a regular  diet.  The patient was discharged home 1 Day Post-Op, at which point was tolerating a regular solid diet, was able to void spontaneously, have adequate pain control with P.O. pain medication, and could ambulate without difficulty. The patient will follow up with [place] for post op check.   Condition at Discharge: Improved  Discharge Medications:  Allergies as of 02/28/2018      Reactions   Dimenhydrinate Other (See Comments)   "locks up kidneys"   Morphine And Related Nausea And Vomiting   Percocet [oxycodone-acetaminophen] Other (See Comments)   hallucinations   Zolpidem Other (See Comments)   Unusual behavior      Medication List    STOP taking these medications   aspirin EC 81 MG tablet   CALCIUM-D PO   PRESERVISION AREDS 2 PO     TAKE these medications   ADVAIR DISKUS 500-50 MCG/DOSE Aepb Generic drug:  Fluticasone-Salmeterol INHALE ONE PUFF BY MOUTH INTO THE LUNGS TWICE A DAY What changed:  See the new instructions.   albuterol 108 (90 Base) MCG/ACT  inhaler Commonly known as:  PROVENTIL HFA INHALE 2 PUFF AS DIRECTED FOUR TIMES A DAY AS NEEDED What changed:    how much to take  how to take this  when to take this  reasons to take this  additional instructions   atorvastatin 20 MG tablet Commonly known as:  LIPITOR TAKE ONE TABLET BY MOUTH DAILY **MUST CALL MD FOR APPOINTMENT What changed:  See the new instructions.   carisoprodol 350 MG tablet Commonly known as:  SOMA Take 350 mg by mouth every 8 (eight) hours as needed (for arthritis pain).   docusate sodium 100 MG capsule Commonly known as:  COLACE Take 1 capsule (100 mg total) by mouth 2 (two) times daily.   esomeprazole 20 MG capsule Commonly known as:  NEXIUM Take 20 mg by mouth daily at 12 noon.   gabapentin 600 MG tablet Commonly known as:  NEURONTIN Take 1,200 mg by mouth 3 (three) times daily.   HYDROmorphone 4 MG tablet Commonly known as:  DILAUDID Take 1 tablet (4 mg total) by mouth  every 4 (four) hours as needed for severe pain.   hydroxychloroquine 200 MG tablet Commonly known as:  PLAQUENIL Take 200 mg by mouth every other day.   metoprolol tartrate 25 MG tablet Commonly known as:  LOPRESSOR TAKE ONE TABLET BY MOUTH TWICE A DAY   montelukast 10 MG tablet Commonly known as:  SINGULAIR TAKE ONE TABLET BY MOUTH DAILY What changed:    how much to take  how to take this  when to take this  additional instructions   ondansetron 4 MG tablet Commonly known as:  ZOFRAN Take 1 tablet (4 mg total) by mouth every 8 (eight) hours as needed for nausea or vomiting.   senna-docusate 8.6-50 MG tablet Commonly known as:  Senokot-S Take 1 tablet by mouth daily.   silodosin 8 MG Caps capsule Commonly known as:  RAPAFLO Take 8 mg by mouth 2 (two) times daily.   traMADol 50 MG tablet Commonly known as:  ULTRAM Take 1 tablet (50 mg total) by mouth every 6 (six) hours as needed.

## 2018-02-28 NOTE — Progress Notes (Signed)
D/C instructions reviewed w/ pt and family. Pt verbalizes understanding and all questions answered. Pt d/c in w/c in stable condition to family's car. Pt in possession of d/c packet, script, and all personal belongings.

## 2018-02-28 NOTE — Progress Notes (Signed)
Urology Progress Note   1 Day Post-Op s/p Right robotic partial nephrectomy  Subjective: NAEON.  VSS, afebrile Pain fairly well controlled Tolerating clear liquid diet Hgb 10.4 from 11.1. Creatinine stable at 0.85   Objective: Vital signs in last 24 hours: Temp:  [97.4 F (36.3 C)-98.8 F (37.1 C)] 98.6 F (37 C) (02/27 0647) Pulse Rate:  [72-93] 81 (02/27 0647) Resp:  [13-21] 18 (02/27 0647) BP: (97-151)/(61-90) 97/61 (02/27 0647) SpO2:  [90 %-100 %] 92 % (02/27 0647) Arterial Line BP: (125-133)/(48-53) 125/48 (02/26 1630) Weight:  [82.6 kg] 82.6 kg (02/26 1712)  Intake/Output from previous day: 02/26 0701 - 02/27 0700 In: 3324.2 [I.V.:3324.2] Out: 510 [Urine:285; Blood:225] Intake/Output this shift: No intake/output data recorded.  Physical Exam:  General: Alert and oriented CV: RRR Lungs: Clear Abdomen: Soft, appropriately tender. Port incisions clean dry and intact GU: Foley in place draining clear yellow urine  Ext: NT, No erythema  Lab Results: Recent Labs    02/27/18 1402 02/27/18 1601 02/28/18 0408  HGB 11.6* 11.1* 10.4*  HCT 34.0* 36.2 33.4*   BMET Recent Labs    02/27/18 1402 02/28/18 0408  NA 139 138  K 3.8 4.4  CL  --  108  CO2  --  24  GLUCOSE 99 227*  BUN  --  23  CREATININE  --  0.95  CALCIUM  --  8.0*     Studies/Results: No results found.  Assessment/Plan:  78 y.o. female s/p Right robotic partial nephrectomy.  Overall doing well post-op.   - [place], regular diet - discontinue foley, follow up TOV - Ambulate this AM - Will re-evaluate late morning. Anticipate discharge home later today if doing well   Dispo: stable   LOS: 0 days   Fredricka Bonine 02/28/2018, 7:28 AM

## 2018-02-28 NOTE — Progress Notes (Signed)
1 Day Post-Op Subjective: NAE.  Pain controlled.  Denies N/V.  Good UOP.  Labs WNL.    Objective: Vital signs in last 24 hours: Temp:  [97.4 F (36.3 C)-98.8 F (37.1 C)] 98.1 F (36.7 C) (02/27 0922) Pulse Rate:  [72-96] 96 (02/27 0937) Resp:  [13-21] 18 (02/27 0937) BP: (97-137)/(61-90) 123/65 (02/27 0937) SpO2:  [90 %-100 %] 94 % (02/27 0937) Arterial Line BP: (125-133)/(48-53) 125/48 (02/26 1630) Weight:  [82.6 kg] 82.6 kg (02/26 1712)  Intake/Output from previous day: 02/26 0701 - 02/27 0700 In: 3324.2 [I.V.:3324.2] Out: 510 [Urine:285; Blood:225]  Intake/Output this shift: Total I/O In: 550.3 [P.O.:360; I.V.:190.3] Out: 1900 [Urine:1900]  Physical Exam:  General: Alert and oriented CV: RRR, palpable distal pulses Lungs: CTAB, equal chest rise Abdomen: Soft, NTND, no rebound or guarding Incisions: c/d/i Gu: Foley draining clear-yellow urine Ext: NT, No erythema  Lab Results: Recent Labs    02/27/18 1402 02/27/18 1601 02/28/18 0408  HGB 11.6* 11.1* 10.4*  HCT 34.0* 36.2 33.4*   BMET Recent Labs    02/27/18 1402 02/28/18 0408  NA 139 138  K 3.8 4.4  CL  --  108  CO2  --  24  GLUCOSE 99 227*  BUN  --  23  CREATININE  --  0.95  CALCIUM  --  8.0*     Studies/Results: No results found.  Assessment/Plan: POD 1 s/p right RPNx  -OOBTC and ambulate -ADAT -d/c Foley -Possibly home later today    LOS: 0 days   Ellison Hughs, MD [facility] Pager: [phone]  02/28/2018, 10:57 AM

## 2018-02-28 NOTE — Plan of Care (Signed)

## 2018-03-01 NOTE — Anesthesia Postprocedure Evaluation (Signed)
Anesthesia Post Note  [name]  Procedure(s) Performed: XI ROBOTIC ASSISTE PARTIAL NEPHRECTOMY (Right )     Patient location during evaluation: PACU Anesthesia Type: General Level of consciousness: awake and alert Pain management: pain level controlled Vital Signs Assessment: post-procedure vital signs reviewed and stable Respiratory status: spontaneous breathing, nonlabored ventilation, respiratory function stable and patient connected to nasal cannula oxygen Cardiovascular status: blood pressure returned to baseline and stable Postop Assessment: no apparent nausea or vomiting Anesthetic complications: no    Last Vitals:  Vitals:   02/28/18 0937 02/28/18 1200  BP: 123/65   Pulse: 96   Resp: 18   Temp:    SpO2: 94% 95%    Last Pain:  Vitals:   02/28/18 1037  TempSrc:   PainSc: [place]

## 2018-03-08 DIAGNOSIS — C641 Malignant neoplasm of right kidney, except renal pelvis: Secondary | ICD-10-CM | POA: Diagnosis not present

## 2018-03-13 DIAGNOSIS — C649 Malignant neoplasm of unspecified kidney, except renal pelvis: Secondary | ICD-10-CM | POA: Insufficient documentation

## 2018-03-13 DIAGNOSIS — Z85528 Personal history of other malignant neoplasm of kidney: Secondary | ICD-10-CM | POA: Insufficient documentation

## 2018-03-15 ENCOUNTER — Encounter: Payer: Self-pay | Admitting: Family Medicine

## 2018-04-08 ENCOUNTER — Encounter: Payer: Self-pay | Admitting: Family Medicine

## 2018-04-16 DIAGNOSIS — N312 Flaccid neuropathic bladder, not elsewhere classified: Secondary | ICD-10-CM | POA: Diagnosis not present

## 2018-04-16 DIAGNOSIS — C641 Malignant neoplasm of right kidney, except renal pelvis: Secondary | ICD-10-CM | POA: Diagnosis not present

## 2018-05-06 DIAGNOSIS — Z79891 Long term (current) use of opiate analgesic: Secondary | ICD-10-CM | POA: Diagnosis not present

## 2018-05-06 DIAGNOSIS — M47812 Spondylosis without myelopathy or radiculopathy, cervical region: Secondary | ICD-10-CM | POA: Diagnosis not present

## 2018-05-06 DIAGNOSIS — G894 Chronic pain syndrome: Secondary | ICD-10-CM | POA: Diagnosis not present

## 2018-05-06 DIAGNOSIS — M150 Primary generalized (osteo)arthritis: Secondary | ICD-10-CM | POA: Diagnosis not present

## 2018-05-20 IMAGING — RF DG C-ARM 61-120 MIN
1 series · 2 of 2 positions shown · non-contrast
Comparison: None

FLUOROSCOPY TIME:  23 seconds

CLINICAL DATA: Right hip surgery.

EXAM:
DG C-ARM 61-120 MIN; OPERATIVE RIGHT HIP WITH PELVIS

[Series 1: run · 2 of 2 slices shown]
[im 1/2]
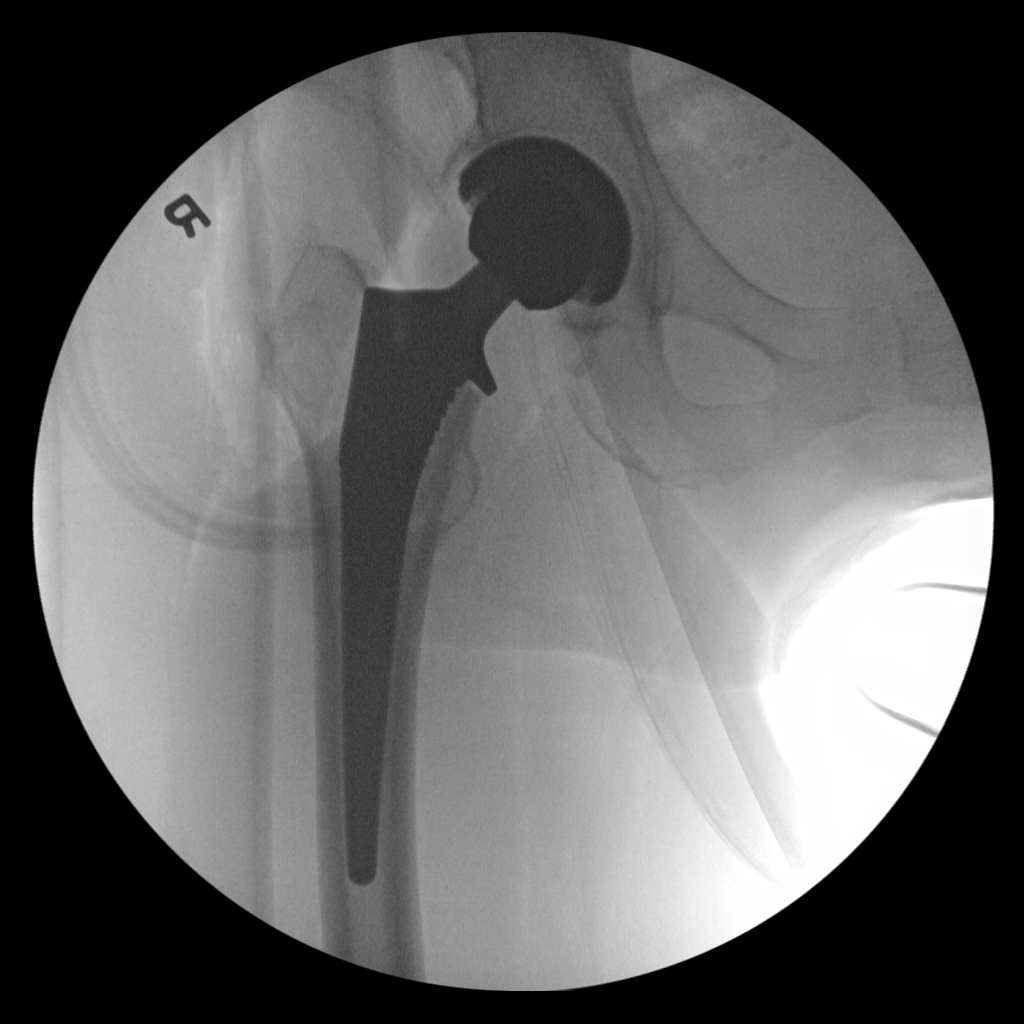
[im 2/2]
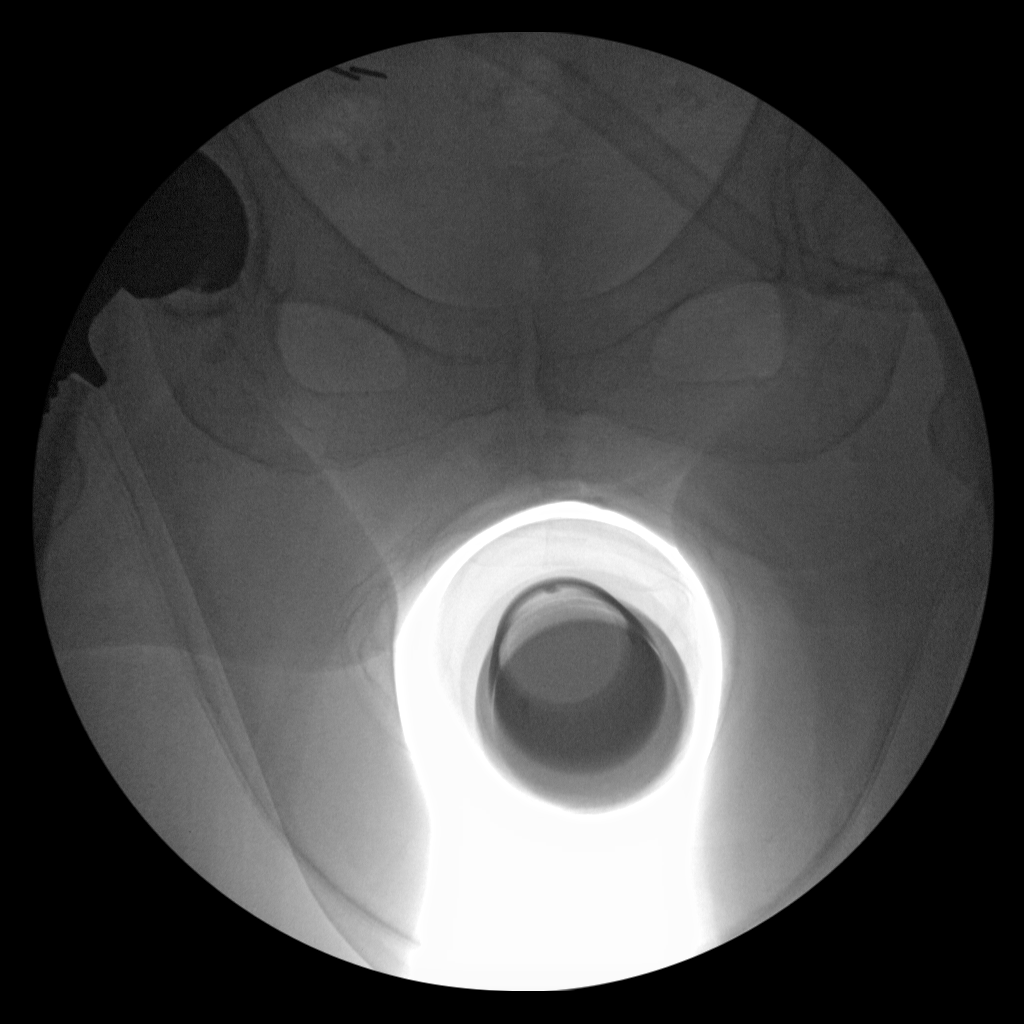

[2 of 2 positions shown; findings below may reference images not displayed]

FINDINGS: Right total hip arthroplasty. Postsurgical changes in the
surrounding soft tissues. Normal alignment. No acute fracture
dislocation.
IMPRESSION: Interval right total hip arthroplasty.

## 2018-05-21 ENCOUNTER — Telehealth: Payer: Self-pay

## 2018-05-21 NOTE — Telephone Encounter (Signed)
Virtual Visit Pre-Appointment Phone Call  "[name] Long, I am calling you today to discuss your upcoming appointment. We are currently trying to limit exposure to the virus that causes COVID-19 by seeing patients at home rather than in the office."  1. "What is the BEST phone number to call the day of the visit?" - include this in appointment notes  2. "Do you have or have access to (through a family member/friend) a smartphone with video capability that we can use for your visit?" a. If yes - list this number in appt notes as "cell" (if different from BEST phone #) and list the appointment type as a VIDEO visit in appointment notes b. If no - list the appointment type as a PHONE visit in appointment notes  3. Confirm consent - "In the setting of the current Covid19 crisis, you are scheduled for a VIDEO visit with  your provider on 05/28/2018 at 8am. Just as we do with many in-office visits, in order for you to participate in this visit, we must obtain consent.  If you'd like, I can send this to your mychart (if signed up) or email for you to review.  Otherwise, I can obtain your verbal consent now.  All virtual visits are billed to your insurance company just like a normal visit would be.  By agreeing to a virtual visit, we'd like you to understand that the technology does not allow for your provider to perform an examination, and thus may limit your provider's ability to fully assess your condition. If your provider identifies any concerns that need to be evaluated in person, we will make arrangements to do so.  Finally, though the technology is pretty good, we cannot assure that it will always work on either your or our end, and in the setting of a video visit, we may have to convert it to a phone-only visit.  In either situation, we cannot ensure that we have a secure connection.  Are you willing to proceed?" STAFF: Did the patient verbally acknowledge consent to telehealth visit? Document YES/NO  here: YES  4. Advise patient to be prepared - "Two hours prior to your appointment, go ahead and check your blood pressure, pulse, oxygen saturation, and your weight (if you have the equipment to check those) and write them all down. When your visit starts, your provider will ask you for this information. If you have an Apple Watch or Kardia device, please plan to have heart rate information ready on the day of your appointment. Please have a pen and paper handy nearby the day of the visit as well."  5. Give patient instructions for MyChart download to smartphone OR Doximity/Doxy.me as below if video visit (depending on what platform provider is using)  6. Inform patient they will receive a phone call 15 minutes prior to their appointment time (may be from unknown caller ID) so they should be prepared to answer    [place] has been deemed a candidate for a follow-up tele-health visit to limit community exposure during the Covid-19 pandemic. I spoke with the patient via phone to ensure availability of phone/video source, confirm preferred email & phone number, and discuss instructions and expectations.  I reminded [name] Long to be prepared with any vital sign and/or heart rhythm information that could potentially be obtained via home monitoring, at the time of her visit. I reminded [name] Long to expect a phone call prior to her  visit.  Harold Hedge, CMA 05/21/2018 3:38 PM   INSTRUCTIONS FOR DOWNLOADING THE MYCHART APP TO SMARTPHONE  - The patient must first make sure to have activated MyChart and know their login information - If Apple, go to [employer] and type in MyChart in the search bar and download the app. If Android, ask patient to go to [employer] and type in [place] in the search bar and download the app. The app is free but as with any other app downloads, their phone may require them to verify saved payment information or Apple/Android password.   - The patient will need to then log into the app with their MyChart username and password, and select [facility] as their healthcare provider to link the account. When it is time for your visit, go to the MyChart app, find appointments, and click Begin Video Visit. Be sure to Select Allow for your device to access the Microphone and Camera for your visit. You will then be connected, and your provider will be with you shortly.  **If they have any issues connecting, or need assistance please contact MyChart service desk [phone])**  **If using a computer, in order to ensure the best quality for their visit they will need to use either of the following Internet Browsers: [employer], or Google Chrome**  IF USING DOXIMITY or DOXY.ME - The patient will receive a link just prior to their visit by text.     FULL LENGTH CONSENT FOR TELE-HEALTH VISIT   I hereby voluntarily request, consent and authorize [facility] and its employed or contracted physicians, physician assistants, nurse practitioners or other licensed health care professionals (the Practitioner), to provide me with telemedicine health care services (the "Services") as deemed necessary by the treating Practitioner. I acknowledge and consent to receive the Services by the Practitioner via telemedicine. I understand that the telemedicine visit will involve communicating with the Practitioner through live audiovisual communication technology and the disclosure of certain medical information by electronic transmission. I acknowledge that I have been given the opportunity to request an in-person assessment or other available alternative prior to the telemedicine visit and am voluntarily participating in the telemedicine visit.  I understand that I have the right to withhold or withdraw my consent to the use of telemedicine in the course of my care at any time, without affecting my right to future care or treatment, and that  the Practitioner or I may terminate the telemedicine visit at any time. I understand that I have the right to inspect all information obtained and/or recorded in the course of the telemedicine visit and may receive copies of available information for a reasonable fee.  I understand that some of the potential risks of receiving the Services via telemedicine include:  Marland Kitchen Delay or interruption in medical evaluation due to technological equipment failure or disruption; . Information transmitted may not be sufficient (e.g. poor resolution of images) to allow for appropriate medical decision making by the Practitioner; and/or  . In rare instances, security protocols could fail, causing a breach of personal health information.  Furthermore, I acknowledge that it is my responsibility to provide information about my medical history, conditions and care that is complete and accurate to the best of my ability. I acknowledge that Practitioner's advice, recommendations, and/or decision may be based on factors not within their control, such as incomplete or inaccurate data provided by me or distortions of diagnostic images or specimens that may result from electronic transmissions. I  understand that the practice of medicine is not an exact science and that Practitioner makes no warranties or guarantees regarding treatment outcomes. I acknowledge that I will receive a copy of this consent concurrently upon execution via email to the email address I last provided but may also request a printed copy by calling the office of [facility].    I understand that my insurance will be billed for this visit.   I have read or had this consent read to me. . I understand the contents of this consent, which adequately explains the benefits and risks of the Services being provided via telemedicine.  . I have been provided ample opportunity to ask questions regarding this consent and the Services and have had my questions answered to  my satisfaction. . I give my informed consent for the services to be provided through the use of telemedicine in my medical care  By participating in this telemedicine visit I agree to the above.Marland Kitchen

## 2018-05-23 ENCOUNTER — Telehealth: Payer: Self-pay | Admitting: Cardiology

## 2018-05-23 NOTE — Telephone Encounter (Signed)
insurance verified/ smartphone/ consent/ my chart active/ pre reg completed

## 2018-05-28 ENCOUNTER — Telehealth (INDEPENDENT_AMBULATORY_CARE_PROVIDER_SITE_OTHER): Payer: PPO | Admitting: Cardiology

## 2018-05-28 ENCOUNTER — Telehealth: Payer: Self-pay

## 2018-05-28 ENCOUNTER — Encounter: Payer: Self-pay | Admitting: Cardiology

## 2018-05-28 VITALS — BP 132/78 | Ht 64.0 in | Wt 175.0 lb

## 2018-05-28 DIAGNOSIS — E782 Mixed hyperlipidemia: Secondary | ICD-10-CM

## 2018-05-28 DIAGNOSIS — Z951 Presence of aortocoronary bypass graft: Secondary | ICD-10-CM

## 2018-05-28 DIAGNOSIS — Z905 Acquired absence of kidney: Secondary | ICD-10-CM

## 2018-05-28 DIAGNOSIS — I2510 Atherosclerotic heart disease of native coronary artery without angina pectoris: Secondary | ICD-10-CM

## 2018-05-28 DIAGNOSIS — Z85520 Personal history of malignant carcinoid tumor of kidney: Secondary | ICD-10-CM | POA: Diagnosis not present

## 2018-05-28 DIAGNOSIS — C641 Malignant neoplasm of right kidney, except renal pelvis: Secondary | ICD-10-CM

## 2018-05-28 NOTE — Patient Instructions (Signed)
Medication Instructions:  Your physician recommends that you continue on your current medications as directed. Please refer to the Current Medication list given to you today.  If you need a refill on your cardiac medications before your next appointment, please call your pharmacy.   Lab work: None  If you have labs (blood work) drawn today and your tests are completely normal, you will receive your results only by: . MyChart Message (if you have MyChart) OR . A paper copy in the mail If you have any lab test that is abnormal or we need to change your treatment, we will call you to review the results.  Testing/Procedures: None   Follow-Up: At [facility], you and your health needs are our priority.  As part of our continuing mission to provide you with exceptional heart care, we have created designated Provider Care Teams.  These Care Teams include your primary Cardiologist (physician) and Advanced Practice Providers (APPs -  Physician Assistants and Nurse Practitioners) who all work together to provide you with the care you need, when you need it. You will need a follow up appointment in 6 months.  Please call our office 2 months in advance to schedule this appointment.  You may see Jonathan Berry, MD or one of the following Advanced Practice Providers on your designated Care Team:   Luke Kilroy, PA-C Krista Kroeger, PA-C . Callie Goodrich, PA-C  Any Other Special Instructions Will Be Listed Below (If Applicable).   

## 2018-05-28 NOTE — Progress Notes (Signed)
Virtual Visit via Video Note   This visit type was conducted due to national recommendations for restrictions regarding the COVID-19 Pandemic (e.g. social distancing) in an effort to limit this patient's exposure and mitigate transmission in our community.  Due to her co-morbid illnesses, this patient is at least at moderate risk for complications without adequate follow up.  This format is felt to be most appropriate for this patient at this time.  All issues noted in this document were discussed and addressed.  A limited physical exam was performed with this format.  Please refer to the patient's chart for her consent to telehealth for [facility].   Date:  05/28/2018   ID:  [name] Long, DOB September 28, 1940, MRN [id number]  Patient Location: Home Provider Location: Office  PCP:  Denita Lung, MD  Cardiologist:  Quay Burow, MD  Electrophysiologist:  None   Evaluation Performed:  Follow-Up Visit  Chief Complaint:  none  History of Present Illness:    [name] Long is a 78 y.o. female with a history of CAD, s/p CABG x 04 Jan 2200 complicated by post op sternal wound infection.  Echo in Nov 2019 showed normal LVF with grade 2 DD.  In Feb 2020 she had Rt nephrectomy for renal cell CA and tolerated this well.  She was contacted today for routine follow up visit.  She has been doing well , no chest or unusual dyspnea.  No problems with her medications.   The patient does not have symptoms concerning for COVID-19 infection (fever, chills, cough, or new shortness of breath).    Past Medical History:  Diagnosis Date  . Abnormal EKG 01/12/2011  . Allergy    RHINITIS  . Arthritis   . Asthma   . Asthma 01/12/2011  . Back pain, chronic--plans were for cervical disc surgery week of 01/16/11 01/12/2011  . Blood clot in vein 2004   L leg  . Blood transfusion without reported diagnosis 2004  . Chronic low back pain   . Complication of anesthesia   . Connective tissue disease ([place])   .  Coronary artery disease   . Crescendo angina, with EKG changes, new. 01/12/2011  . Dyspnea   . Fibromyalgia   . Fibromyalgia   . GERD (gastroesophageal reflux disease)   . Headache    migraine - several in a yr.   . History of stress test 04/2011   Normal stress test  . Hx of echocardiogram 07/19/2011   EF 50-55%. There is mild annular calcification, there is trace migtral regurgitation, there is trace tricupid regurgitation, pericardium is thick, there is no pericardial effusion.  . Hypertension   . Myocardial infarction [facility]) 2013   triple heart bypass  . Neuromuscular disorder (HCC)    fibromyalgia   . Osteoporosis    OSTEOPENIA  . Personal history of colonic polyp - adenoma 08/05/2013   08/2013 - 7 mm adenoma - consider repeat colonoscopy 2020 (age 71)  . Pneumonia    numerous times, only admitted one time for pneumonia but she has had pneumonia numerous times;   02-19-2018 END OF NOVEMBER 2019  WAS ADMITTED FOR ABOUT  A WEEK DUE TO "DOUBLE PNEUMONIA", REPORTS TODAY FEELING BACK TO HER NORMAL , DENIES SOB, O2 SAT AT 94 ROOM AIR , AFEBRILE, OTHER VSS   . PONV (postoperative nausea and vomiting)   . PPD positive   . Status post coronary artery bypass grafting   . Urinary incontinence    post op, "  sleepy bladder", urinar retention    Past Surgical History:  Procedure Laterality Date  . ABDOMINAL HYSTERECTOMY  1981  . APPENDECTOMY  1946  . BLADDER REPAIR  1985  . BREAST SURGERY  1992   REDUCTION, x3 surgeries overall on breast for augmentation   . CARDIAC CATHETERIZATION  2013  . CARPAL TUNNEL RELEASE Bilateral 1988, 1999  . [place]  . CORONARY ARTERY BYPASS GRAFT  01/14/2011   Procedure: CORONARY ARTERY BYPASS GRAFTING (CABG);  Surgeon: Tharon Aquas Adelene Idler, MD;  Location: [facility];  Service: Open Heart Surgery;  Laterality: N/A;  CABG x three, using right greater saphenous vein harvested endoscopically  . EYE SURGERY Bilateral 1985   radiokerototomy  .  LEFT HEART CATHETERIZATION WITH CORONARY ANGIOGRAM N/A 01/13/2011   Procedure: LEFT HEART CATHETERIZATION WITH CORONARY ANGIOGRAM;  Surgeon: Troy Sine, MD;  Location: [facility] C[facility] LAB;  Service: Cardiovascular;  Laterality: N/A;  . PUBOVAGINAL SLING  2002  . ROBOT ASSISTED LAPAROSCOPIC NEPHRECTOMY Right 02/27/2018   Procedure: XI ROBOTIC ASSISTE PARTIAL NEPHRECTOMY;  Surgeon: Ceasar Mons, MD;  Location: [facility];  Service: Urology;  Laterality: Right;  . STERNAL WOUND DEBRIDEMENT  07/27/2011   Procedure: STERNAL WOUND DEBRIDEMENT;  followed by wound vac Surgeon: Ivin Poot, MD;  Location: [facility];  Service: Open Heart Surgery;  Laterality: N/A;  . TONSILLECTOMY  1952  . TOTAL HIP ARTHROPLASTY Right 08/22/2016   Procedure: TOTAL HIP ARTHROPLASTY ANTERIOR APPROACH;  Surgeon: Melrose Nakayama, MD;  Location: [facility];  Service: Orthopedics;  Laterality: Right;  . TRIGGER FINGER RELEASE Right 2014   4th finger  . TROCHANTERIC BURSA EXCISION Right    bursa removed, R hip  . TUBAL LIGATION       Current Meds  Medication Sig  . ADVAIR DISKUS 500-50 MCG/DOSE AEPB INHALE ONE PUFF BY MOUTH INTO THE LUNGS TWICE A DAY (Patient taking differently: Inhale 1 puff into the lungs 2 (two) times daily. )  . albuterol (PROVENTIL HFA) 108 (90 Base) MCG/ACT inhaler INHALE 2 PUFF AS DIRECTED FOUR TIMES A DAY AS NEEDED (Patient taking differently: Inhale 1 puff into the lungs every 6 (six) hours as needed for wheezing or shortness of breath. )  . atorvastatin (LIPITOR) 20 MG tablet TAKE ONE TABLET BY MOUTH DAILY **MUST CALL MD FOR APPOINTMENT (Patient taking differently: Take 20 mg by mouth daily. )  . carisoprodol (SOMA) 350 MG tablet Take 350 mg by mouth every 8 (eight) hours as needed (for arthritis pain).   Marland Kitchen docusate sodium (COLACE) 100 MG capsule Take 1 capsule (100 mg total) by mouth 2 (two) times daily.  Marland Kitchen esomeprazole (NEXIUM) 20 MG capsule Take 20 mg by mouth daily at 12 noon.  . gabapentin  (NEURONTIN) 600 MG tablet Take 1,200 mg by mouth 3 (three) times daily.   Marland Kitchen HYDROmorphone (DILAUDID) 4 MG tablet Take 1 tablet (4 mg total) by mouth every 4 (four) hours as needed for severe pain.  . metoprolol tartrate (LOPRESSOR) 25 MG tablet TAKE ONE TABLET BY MOUTH TWICE A DAY (Patient taking differently: Take 25 mg by mouth 2 (two) times daily. )  . montelukast (SINGULAIR) 10 MG tablet TAKE ONE TABLET BY MOUTH DAILY (Patient taking differently: Take 10 mg by mouth at bedtime. )  . ondansetron (ZOFRAN) 4 MG tablet Take 1 tablet (4 mg total) by mouth every 8 (eight) hours as needed for nausea or vomiting.  . senna-docusate (SENOKOT-S) 8.6-50 MG tablet Take 1  tablet by mouth daily.  . silodosin (RAPAFLO) 8 MG CAPS capsule Take 8 mg by mouth 2 (two) times daily.   . traMADol (ULTRAM) 50 MG tablet Take 1 tablet (50 mg total) by mouth every 6 (six) hours as needed.  . [DISCONTINUED] hydroxychloroquine (PLAQUENIL) 200 MG tablet Take 200 mg by mouth every other day.      Allergies:   Dimenhydrinate; Morphine and related; Percocet [oxycodone-acetaminophen]; and Zolpidem   Social History   Tobacco Use  . Smoking status: Former Smoker    Last attempt to quit: 01/12/1980    Years since quitting: 38.4  . Smokeless tobacco: Never Used  Substance Use Topics  . Alcohol use: No    Alcohol/week: 0.0 standard drinks  . Drug use: No     Family Hx: The patient's family history includes Hypertension in her brother. She was adopted.  ROS:   Please see the history of present illness.    All other systems reviewed and are negative.   Prior CV studies:   The following studies were reviewed today:  Echo Nov 2019  Labs/Other Tests and Data Reviewed:    EKG:  An ECG dated 11/21/2017 was personally reviewed today and demonstrated:  NSR, diffuse TWI  Recent Labs: 11/19/2017: B Natriuretic Peptide 86.1 11/21/2017: ALT 78 02/19/2018: Platelets 185 02/28/2018: BUN 23; Creatinine, Ser 0.95; Hemoglobin  10.4; Potassium 4.4; Sodium 138   Recent Lipid Panel Lab Results  Component Value Date/Time   CHOL 150 05/17/2017 08:06 AM   TRIG 42 05/17/2017 08:06 AM   HDL 90 05/17/2017 08:06 AM   CHOLHDL 1.7 05/17/2017 08:06 AM   CHOLHDL 1.7 11/29/2015 11:44 AM   LDLCALC 52 05/17/2017 08:06 AM    Wt Readings from Last 3 Encounters:  05/28/18 175 lb (79.4 kg)  02/27/18 182 lb 1.6 oz (82.6 kg)  02/19/18 182 lb (82.6 kg)     Objective:    Vital Signs:  BP 132/78   Ht 5\' 4"  (1.626 m)   Wt 175 lb (79.4 kg)   BMI 30.04 kg/m    VITAL SIGNS:  reviewed  ASSESSMENT & PLAN:    CABG x 05 Jan 2011- doing well  Diastolic dysfunction Echo Nov 2019- normal LVF mild LVH  Renal cell CA S/p Rt nephrectomy  Feb 2020  COVID-19 Education: The signs and symptoms of COVID-19 were discussed with the patient and how to seek care for testing (follow up with PCP or arrange E-visit).  The importance of social distancing was discussed today.  Time:   Today, I have spent 10 minutes with the patient with telehealth technology discussing the above problems.     Medication Adjustments/Labs and Tests Ordered: Current medicines are reviewed at length with the patient today.  Concerns regarding medicines are outlined above.   Tests Ordered: No orders of the defined types were placed in this encounter.   Medication Changes: No orders of the defined types were placed in this encounter.   Disposition:  Follow up Dr [name] Long in 6 months  Signed, Kerin Ransom, PA-C  05/28/2018 8:05 AM    [place]

## 2018-05-28 NOTE — Telephone Encounter (Signed)
Called patient to discuss AVS instructions. Patient agreeable with Luke's recommendations and voiced understanding.

## 2018-06-05 ENCOUNTER — Other Ambulatory Visit: Payer: Self-pay | Admitting: Cardiovascular Disease

## 2018-07-03 ENCOUNTER — Other Ambulatory Visit: Payer: Self-pay

## 2018-07-03 ENCOUNTER — Other Ambulatory Visit: Payer: Self-pay | Admitting: Urology

## 2018-07-03 ENCOUNTER — Ambulatory Visit
Admission: RE | Admit: 2018-07-03 | Discharge: 2018-07-03 | Disposition: A | Discharge status: Home / Self Care | Payer: PPO | Source: Ambulatory Visit | Attending: Urology | Admitting: Urology

## 2018-07-03 DIAGNOSIS — C641 Malignant neoplasm of right kidney, except renal pelvis: Secondary | ICD-10-CM

## 2018-07-03 DIAGNOSIS — I2510 Atherosclerotic heart disease of native coronary artery without angina pectoris: Secondary | ICD-10-CM | POA: Diagnosis not present

## 2018-07-03 DIAGNOSIS — Z951 Presence of aortocoronary bypass graft: Secondary | ICD-10-CM | POA: Diagnosis not present

## 2018-07-03 DIAGNOSIS — I10 Essential (primary) hypertension: Secondary | ICD-10-CM | POA: Diagnosis not present

## 2018-07-10 DIAGNOSIS — C641 Malignant neoplasm of right kidney, except renal pelvis: Secondary | ICD-10-CM | POA: Diagnosis not present

## 2018-07-15 DIAGNOSIS — C641 Malignant neoplasm of right kidney, except renal pelvis: Secondary | ICD-10-CM | POA: Diagnosis not present

## 2018-07-15 DIAGNOSIS — Z905 Acquired absence of kidney: Secondary | ICD-10-CM | POA: Diagnosis not present

## 2018-07-22 DIAGNOSIS — R3914 Feeling of incomplete bladder emptying: Secondary | ICD-10-CM | POA: Diagnosis not present

## 2018-07-22 DIAGNOSIS — C641 Malignant neoplasm of right kidney, except renal pelvis: Secondary | ICD-10-CM | POA: Diagnosis not present

## 2018-08-05 DIAGNOSIS — Z79891 Long term (current) use of opiate analgesic: Secondary | ICD-10-CM | POA: Diagnosis not present

## 2018-08-05 DIAGNOSIS — M47812 Spondylosis without myelopathy or radiculopathy, cervical region: Secondary | ICD-10-CM | POA: Diagnosis not present

## 2018-08-05 DIAGNOSIS — G894 Chronic pain syndrome: Secondary | ICD-10-CM | POA: Diagnosis not present

## 2018-08-05 DIAGNOSIS — M150 Primary generalized (osteo)arthritis: Secondary | ICD-10-CM | POA: Diagnosis not present

## 2018-08-22 ENCOUNTER — Other Ambulatory Visit (INDEPENDENT_AMBULATORY_CARE_PROVIDER_SITE_OTHER): Payer: PPO

## 2018-08-22 ENCOUNTER — Other Ambulatory Visit: Payer: Self-pay

## 2018-08-22 DIAGNOSIS — Z23 Encounter for immunization: Secondary | ICD-10-CM

## 2018-08-23 ENCOUNTER — Encounter: Payer: Self-pay | Admitting: Internal Medicine

## 2018-09-23 DIAGNOSIS — M25561 Pain in right knee: Secondary | ICD-10-CM | POA: Diagnosis not present

## 2018-09-23 DIAGNOSIS — M1611 Unilateral primary osteoarthritis, right hip: Secondary | ICD-10-CM | POA: Diagnosis not present

## 2018-10-04 ENCOUNTER — Ambulatory Visit (INDEPENDENT_AMBULATORY_CARE_PROVIDER_SITE_OTHER): Payer: PPO | Admitting: Cardiovascular Disease

## 2018-10-04 ENCOUNTER — Other Ambulatory Visit: Payer: Self-pay

## 2018-10-04 ENCOUNTER — Encounter: Payer: Self-pay | Admitting: Cardiovascular Disease

## 2018-10-04 VITALS — BP 121/75 | HR 73 | Temp 98.1°F | Ht 64.0 in | Wt 166.2 lb

## 2018-10-04 DIAGNOSIS — E782 Mixed hyperlipidemia: Secondary | ICD-10-CM

## 2018-10-04 DIAGNOSIS — I10 Essential (primary) hypertension: Secondary | ICD-10-CM | POA: Diagnosis not present

## 2018-10-04 DIAGNOSIS — Z008 Encounter for other general examination: Secondary | ICD-10-CM

## 2018-10-04 DIAGNOSIS — Z951 Presence of aortocoronary bypass graft: Secondary | ICD-10-CM | POA: Diagnosis not present

## 2018-10-04 NOTE — Patient Instructions (Signed)

## 2018-10-04 NOTE — Progress Notes (Signed)
10/04/2018 [name]   06/25/1940  [id number]  Primary Physician Denita Lung, MD Primary Cardiologist: Lorretta Harp MD FACP, [place], [place], [place]  HPI:  [name] Long is a 78 y.o.  mildly overweight married Caucasian female, mother of 2, grandmother to 6 grandchildren, who I last saw in the office  11/27/2017.Marland Kitchen She ended up being admitted January 12, 2011, with chest pain and was catheterized, revealing a left main 3-vessel disease with an EF of 50% and high anterior hypokinesia, for which she underwent coronary artery bypass grafting by Dr. Tharon Aquas Trigt with a LIMA to her LAD, vein to an obtuse marginal branch and to the right coronary artery. Postop course was uncomplicated. A Myoview in April was normal. Her lipid profile was excellent. She saw Cecilie Kicks in our office on July 15 complaining of chest pain, and the concern was musculoskeletal versus pericarditis. A 2D echocardiogram was entirely normal. She ended up seeing Dr. Prescott Gum, who diagnosed a staph sternal wound infection, requiring hospitalization and debridement. She had a wound VAC on and fortunately has completely healed.Since I saw her in the office a yearand a half ago she was complaining of some increased dyspnea on exertion and decreased exercise tolerance. A Myoview stress test was normal as was a 2D echocardiogram. It turns out that her increasing shortness of breath is probably related to exacerbation of her asthma at that time.   She has been diagnosed with renal cell carcinoma and is scheduled to undergo laparoscopic partial nephrectomy by Dr. Gilford Rile at [facility] urology.  Since I saw her a year ago she is done well.  She denies chest pain or shortness of breath.  She is sheltering in place and socially distancing.  Current Meds  Medication Sig   ADVAIR DISKUS 500-50 MCG/DOSE AEPB INHALE ONE PUFF BY MOUTH INTO THE LUNGS TWICE A DAY (Patient taking differently: Inhale 1 puff into the lungs 2 (two)  times daily. )   albuterol (PROVENTIL HFA) 108 (90 Base) MCG/ACT inhaler INHALE 2 PUFF AS DIRECTED FOUR TIMES A DAY AS NEEDED (Patient taking differently: Inhale 1 puff into the lungs every 6 (six) hours as needed for wheezing or shortness of breath. )   aspirin 81 MG chewable tablet Chew 81 mg by mouth daily.   atorvastatin (LIPITOR) 20 MG tablet TAKE ONE TABLET BY MOUTH DAILY **MUST CALL MD FOR APPOINTMENT (Patient taking differently: Take 20 mg by mouth daily. )   carisoprodol (SOMA) 350 MG tablet Take 350 mg by mouth every 8 (eight) hours as needed (for arthritis pain).    gabapentin (NEURONTIN) 600 MG tablet Take 1,200 mg by mouth 3 (three) times daily.    HYDROmorphone (DILAUDID) 4 MG tablet Take 1 tablet (4 mg total) by mouth every 4 (four) hours as needed for severe pain.   metoprolol tartrate (LOPRESSOR) 25 MG tablet TAKE ONE TABLET BY MOUTH TWICE A DAY   montelukast (SINGULAIR) 10 MG tablet TAKE ONE TABLET BY MOUTH DAILY (Patient taking differently: Take 10 mg by mouth at bedtime. )   ondansetron (ZOFRAN) 4 MG tablet Take 1 tablet (4 mg total) by mouth every 8 (eight) hours as needed for nausea or vomiting.   senna-docusate (SENOKOT-S) 8.6-50 MG tablet Take 1 tablet by mouth daily.   silodosin (RAPAFLO) 8 MG CAPS capsule Take 8 mg by mouth 2 (two) times daily.    traMADol (ULTRAM) 50 MG tablet Take 1 tablet (50 mg total) by mouth every 6 (  six) hours as needed.   [DISCONTINUED] docusate sodium (COLACE) 100 MG capsule Take 1 capsule (100 mg total) by mouth 2 (two) times daily.   [DISCONTINUED] esomeprazole (NEXIUM) 20 MG capsule Take 20 mg by mouth daily at 12 noon.     Allergies  Allergen Reactions   Dimenhydrinate Other (See Comments)    "locks up kidneys"   Morphine And Related Nausea And Vomiting   Percocet [Oxycodone-Acetaminophen] Other (See Comments)    hallucinations   Zolpidem Other (See Comments)    Unusual behavior    Social History   Socioeconomic  History   Marital status: Married    Spouse name: Not on file   Number of children: Not on file   Years of education: Not on file   Highest education level: Not on file  Occupational History   Not on file  Social Needs   Financial resource strain: Not on file   Food insecurity    Worry: Not on file    Inability: Not on file   Transportation needs    Medical: Not on file    Non-medical: Not on file  Tobacco Use   Smoking status: Former Smoker    Quit date: 01/12/1980    Years since quitting: 38.7   Smokeless tobacco: Never Used  Substance and Sexual Activity   Alcohol use: No    Alcohol/week: 0.0 standard drinks   Drug use: No   Sexual activity: Yes  Lifestyle   Physical activity    Days per week: Not on file    Minutes per session: Not on file   Stress: Not on file  Relationships   Social connections    Talks on phone: Not on file    Gets together: Not on file    Attends religious service: Not on file    Active member of club or organization: Not on file    Attends meetings of clubs or organizations: Not on file    Relationship status: Not on file   Intimate partner violence    Fear of current or ex partner: Not on file    Emotionally abused: Not on file    Physically abused: Not on file    Forced sexual activity: Not on file  Other Topics Concern   Not on file  Social History Narrative   Not on file     Review of Systems: General: negative for chills, fever, night sweats or weight changes.  Cardiovascular: negative for chest pain, dyspnea on exertion, edema, orthopnea, palpitations, paroxysmal nocturnal dyspnea or shortness of breath Dermatological: negative for rash Respiratory: negative for cough or wheezing Urologic: negative for hematuria Abdominal: negative for nausea, vomiting, diarrhea, bright red blood per rectum, melena, or hematemesis Neurologic: negative for visual changes, syncope, or dizziness All other systems reviewed and are  otherwise negative except as noted above.    Blood pressure 121/75, pulse 73, temperature 98.1 F (36.7 C), height 5\' 4"  (1.626 m), weight 166 lb 3.2 oz (75.4 kg), SpO2 94 %.  General appearance: alert and no distress Neck: no adenopathy, no carotid bruit, no JVD, supple, symmetrical, trachea midline and thyroid not enlarged, symmetric, no tenderness/mass/nodules Lungs: clear to auscultation bilaterally Heart: regular rate and rhythm, S1, S2 normal, no murmur, click, rub or gallop Extremities: extremities normal, atraumatic, no cyanosis or edema Pulses: 2+ and symmetric Skin: Skin color, texture, turgor normal. No rashes or lesions Neurologic: Alert and oriented X 3, normal strength and tone. Normal symmetric reflexes. Normal coordination and gait  EKG sinus rhythm at 73 with RSR prime in lead V1 and inferior and anterolateral T wave inversion unchanged from prior EKGs.  I personally reviewed this EKG.  ASSESSMENT AND PLAN:   S/P CABG x 3: (lima-lad,svg-om,svg-rca) History of CAD status post CABG x3 by Dr. Dahlia Byes after cardiac catheterization performed 01/12/2011 revealed left main/three-vessel disease.  She had a LIMA to her LAD and vein graft to the marginal branch and PDA.  Postop course was uncomplicated.  Last Myoview performed 12/28/2015 was nonischemic.  She has normal LV function with grade 2 diastolic dysfunction and mild LVH.  She denies chest pain  Hyperlipidemia History of hyperlipidemia on statin therapy with lipid profile performed 05/17/2017 revealing total cholesterol 150, LDL 52 and HDL 90  Essential hypertension History of essential hypertension with blood pressure measured today at 121/75.  She is on metoprolol.      Lorretta Harp MD FACP,FACC,FAHA, FSCAI 10/04/2018 1:40 PM

## 2018-10-04 NOTE — Assessment & Plan Note (Signed)
History of CAD status post CABG x3 by Dr. Dahlia Byes after cardiac catheterization performed 01/12/2011 revealed left main/three-vessel disease.  She had a LIMA to her LAD and vein graft to the marginal branch and PDA.  Postop course was uncomplicated.  Last Myoview performed 12/28/2015 was nonischemic.  She has normal LV function with grade 2 diastolic dysfunction and mild LVH.  She denies chest pain

## 2018-10-04 NOTE — Assessment & Plan Note (Signed)
History of hyperlipidemia on statin therapy with lipid profile performed 05/17/2017 revealing total cholesterol 150, LDL 52 and HDL 90

## 2018-10-04 NOTE — Assessment & Plan Note (Signed)
History of essential hypertension with blood pressure measured today at 121/75.  She is on metoprolol.

## 2018-11-01 ENCOUNTER — Encounter: Payer: Self-pay | Admitting: Family Medicine

## 2018-11-04 DIAGNOSIS — Z79891 Long term (current) use of opiate analgesic: Secondary | ICD-10-CM | POA: Diagnosis not present

## 2018-11-04 DIAGNOSIS — G894 Chronic pain syndrome: Secondary | ICD-10-CM | POA: Diagnosis not present

## 2018-11-04 DIAGNOSIS — M150 Primary generalized (osteo)arthritis: Secondary | ICD-10-CM | POA: Diagnosis not present

## 2018-11-04 DIAGNOSIS — M47812 Spondylosis without myelopathy or radiculopathy, cervical region: Secondary | ICD-10-CM | POA: Diagnosis not present

## 2018-11-30 ENCOUNTER — Other Ambulatory Visit: Payer: Self-pay | Admitting: Family Medicine

## 2018-11-30 DIAGNOSIS — E785 Hyperlipidemia, unspecified: Secondary | ICD-10-CM

## 2018-12-02 DIAGNOSIS — M25561 Pain in right knee: Secondary | ICD-10-CM | POA: Diagnosis not present

## 2018-12-03 ENCOUNTER — Other Ambulatory Visit: Payer: Self-pay | Admitting: Family Medicine

## 2018-12-03 DIAGNOSIS — J4530 Mild persistent asthma, uncomplicated: Secondary | ICD-10-CM

## 2018-12-04 ENCOUNTER — Encounter (INDEPENDENT_AMBULATORY_CARE_PROVIDER_SITE_OTHER): Payer: PPO | Admitting: Ophthalmology

## 2018-12-04 DIAGNOSIS — H35033 Hypertensive retinopathy, bilateral: Secondary | ICD-10-CM | POA: Diagnosis not present

## 2018-12-04 DIAGNOSIS — I10 Essential (primary) hypertension: Secondary | ICD-10-CM

## 2018-12-04 DIAGNOSIS — H2513 Age-related nuclear cataract, bilateral: Secondary | ICD-10-CM | POA: Diagnosis not present

## 2018-12-04 DIAGNOSIS — M069 Rheumatoid arthritis, unspecified: Secondary | ICD-10-CM | POA: Diagnosis not present

## 2018-12-04 DIAGNOSIS — H353131 Nonexudative age-related macular degeneration, bilateral, early dry stage: Secondary | ICD-10-CM | POA: Diagnosis not present

## 2018-12-04 DIAGNOSIS — H43813 Vitreous degeneration, bilateral: Secondary | ICD-10-CM | POA: Diagnosis not present

## 2018-12-05 ENCOUNTER — Encounter: Payer: Self-pay | Admitting: Family Medicine

## 2018-12-05 ENCOUNTER — Other Ambulatory Visit: Payer: Self-pay | Admitting: Family Medicine

## 2018-12-05 ENCOUNTER — Ambulatory Visit (INDEPENDENT_AMBULATORY_CARE_PROVIDER_SITE_OTHER): Payer: PPO | Admitting: Family Medicine

## 2018-12-05 ENCOUNTER — Ambulatory Visit
Admission: RE | Admit: 2018-12-05 | Discharge: 2018-12-05 | Disposition: A | Discharge status: Home / Self Care | Payer: PPO | Source: Ambulatory Visit | Attending: Family Medicine | Admitting: Family Medicine

## 2018-12-05 ENCOUNTER — Other Ambulatory Visit: Payer: Self-pay

## 2018-12-05 VITALS — BP 130/74 | HR 89 | Temp 97.8°F | Wt 164.6 lb

## 2018-12-05 DIAGNOSIS — E785 Hyperlipidemia, unspecified: Secondary | ICD-10-CM

## 2018-12-05 DIAGNOSIS — M549 Dorsalgia, unspecified: Secondary | ICD-10-CM

## 2018-12-05 DIAGNOSIS — H9319 Tinnitus, unspecified ear: Secondary | ICD-10-CM | POA: Diagnosis not present

## 2018-12-05 DIAGNOSIS — R151 Fecal smearing: Secondary | ICD-10-CM

## 2018-12-05 DIAGNOSIS — R0781 Pleurodynia: Secondary | ICD-10-CM

## 2018-12-05 DIAGNOSIS — M069 Rheumatoid arthritis, unspecified: Secondary | ICD-10-CM | POA: Diagnosis not present

## 2018-12-05 DIAGNOSIS — M1711 Unilateral primary osteoarthritis, right knee: Secondary | ICD-10-CM

## 2018-12-05 DIAGNOSIS — E559 Vitamin D deficiency, unspecified: Secondary | ICD-10-CM | POA: Diagnosis not present

## 2018-12-05 DIAGNOSIS — M1611 Unilateral primary osteoarthritis, right hip: Secondary | ICD-10-CM

## 2018-12-05 DIAGNOSIS — R079 Chest pain, unspecified: Secondary | ICD-10-CM | POA: Diagnosis not present

## 2018-12-05 DIAGNOSIS — J4530 Mild persistent asthma, uncomplicated: Secondary | ICD-10-CM | POA: Diagnosis not present

## 2018-12-05 DIAGNOSIS — Z860100 Personal history of colon polyps, unspecified: Secondary | ICD-10-CM

## 2018-12-05 DIAGNOSIS — K219 Gastro-esophageal reflux disease without esophagitis: Secondary | ICD-10-CM | POA: Diagnosis not present

## 2018-12-05 DIAGNOSIS — E782 Mixed hyperlipidemia: Secondary | ICD-10-CM

## 2018-12-05 DIAGNOSIS — I10 Essential (primary) hypertension: Secondary | ICD-10-CM

## 2018-12-05 DIAGNOSIS — R0789 Other chest pain: Secondary | ICD-10-CM

## 2018-12-05 DIAGNOSIS — G8929 Other chronic pain: Secondary | ICD-10-CM

## 2018-12-05 DIAGNOSIS — M797 Fibromyalgia: Secondary | ICD-10-CM

## 2018-12-05 DIAGNOSIS — M8580 Other specified disorders of bone density and structure, unspecified site: Secondary | ICD-10-CM

## 2018-12-05 DIAGNOSIS — W108XXA Fall (on) (from) other stairs and steps, initial encounter: Secondary | ICD-10-CM

## 2018-12-05 DIAGNOSIS — C641 Malignant neoplasm of right kidney, except renal pelvis: Secondary | ICD-10-CM

## 2018-12-05 MED ORDER — FLUTICASONE-SALMETEROL 500-50 MCG/DOSE IN AEPB: 1.0000 | INHALATION_SPRAY | Freq: Two times a day (BID) | RESPIRATORY_TRACT | 3 refills | Status: DC

## 2018-12-05 MED ORDER — MONTELUKAST SODIUM 10 MG PO TABS: 10.0000 mg | ORAL_TABLET | Freq: Every day | ORAL | 1 refills | Status: DC

## 2018-12-05 NOTE — Progress Notes (Signed)
[name] Long is a 78 y.o. female who presents for annual wellness visit and follow-up on chronic medical conditions.  She fell going down her steps on Tuesday and complains of back pain especially her right rib area.  It is pleuritic in nature.  She has had no shortness of breath, abdominal pain, nausea or vomiting.  She does have a long history of difficulty with chronic back pain and is on Dilaudid and Neurontin.  She also has a history of fibromyalgia but is not taking any medication for that.  Prior to the fall she did have an injection into her right knee to help with arthritis.  She does have a previous history of CABG and is followed by Dr. Alvester Chou for this.  She seems to be fairly stable with this.  She continues on her statin without difficulty.  She is not having any more difficulty with reflux stating since she changed her diet she has not needed Nexium.  She does have a previous history of polyps but was told she did not need follow-up.  She does complain of intermittent difficulty with soiling since she changed her eating habits.  She also has a previous history of vitamin D deficiency as well as osteopenia.  She sees urology and has had surgery for renal cell carcinoma.  Immunizations and Health Maintenance Immunization History  Administered Date(s) Administered  . Fluad Quad(high Dose 65+) 08/22/2018  . Influenza, High Dose Seasonal PF 10/18/2013, 12/01/2016, 10/01/2017  . Influenza,inj,Quad PF,6+ Mos 09/23/2012  . Influenza-Unspecified 12/04/2012, 10/07/2013, 11/02/2013, 09/17/2014, 10/17/2014, 10/22/2015, 12/02/2016  . Pneumococcal Conjugate-13 04/08/2015, 02/17/2016  . Pneumococcal Polysaccharide-23 11/19/2002, 09/23/2012, 12/04/2012  . Tdap 07/19/2009  . Zoster 04/16/2008, 03/30/2010  . Zoster Recombinat (Shingrix) 08/09/2016, 11/27/2016   Health Maintenance Due  Topic Date Due  . COLONOSCOPY  08/06/2018    Last Pap smear: aged out  Last mammogram: N/A Last colonoscopy: aged  out  Last DEXA: 08/18/17 Dentist: 11/2018 Ophtho: 12/2018 Exercise: not much do to injury  Other doctors caring for patient include: Dr. Rhona Raider ortho, Dr. Gwenlyn Found cardio, Dr Hardin Negus pain mgt  Advanced directives: on file  Does Patient Have a Medical Advance Directive?: Yes Type of Advance Directive: [place] will Does patient want to make changes to medical advance directive?: No - Patient declined Copy of [place] in Chart?: Yes - validated most recent copy scanned in chart (See row information)  Depression screen:  See questionnaire below.  Depression screen [facility] 2/9 12/05/2018 10/01/2017 07/31/2016  Decreased Interest 0 0 0  Down, Depressed, Hopeless 0 0 0  PHQ - 2 Score 0 0 0    Fall Risk Screen: see questionnaire below. Fall Risk  12/05/2018 10/01/2017 07/31/2016  Falls in the past year? 1 No No  Number falls in past yr: 0 - -  Injury with Fall? 1 - -    ADL screen:  See questionnaire below Functional Status Survey: Is the patient deaf or have difficulty hearing?: No Does the patient have difficulty seeing, even when wearing glasses/contacts?: No Does the patient have difficulty concentrating, remembering, or making decisions?: No Does the patient have difficulty walking or climbing stairs?: Yes(due to injury) Does the patient have difficulty dressing or bathing?: No Does the patient have difficulty doing errands alone such as visiting a doctor's office or shopping?: No   Review of Systems Constitutional: -, -unexpected weight change, -anorexia, -fatigue Allergy: -sneezing, -itching, -congestion Dermatology: denies changing moles, rash, lumps ENT: -runny nose, -  ear pain, -sore throat,  Cardiology:  -chest pain, -palpitations, -orthopnea, Respiratory: -cough, -shortness of breath, -dyspnea on exertion, -wheezing,  Gastroenterology: -abdominal pain, -nausea, -vomiting, -diarrhea, -constipation, -dysphagia Hematology: -bleeding  or bruising problems Musculoskeletal: -arthralgias, -myalgias, -joint swelling, -back pain, - Ophthalmology: -vision changes,  Urology: -dysuria, -difficulty urinating,  -urinary frequency, -urgency, incontinence Neurology: -, -numbness, , -memory loss, -falls, -dizziness    PHYSICAL EXAM:  BP 130/74 (BP Location: Left Arm, Patient Position: Sitting)   Pulse 89   Temp 97.8 F (36.6 C)   Wt 164 lb 9.6 oz (74.7 kg)   SpO2 94%   BMI 28.25 kg/m   General Appearance: Alert, cooperative, complains of back pain appears stated age Head: Normocephalic, without obvious abnormality, atraumatic Eyes: PERRL, conjunctiva/corneas clear, EOM's intact,  Ears: Normal TM's and external ear canals Nose: Nares normal, mucosa normal, no drainage or sinus tenderness Throat: Lips, mucosa, and tongue normal; teeth and gums normal Neck: Supple, no lymphadenopathy;  thyroid:  no enlargement/tenderness/nodules; no carotid bruit or JVD Lungs: Clear to auscultation bilaterally without wheezes, rales or ronchi; respirations unlabored.  Ecchymosis noted over lower lumbar area bilaterally as well as pain over the right posterior ribs. Heart: Regular rate and rhythm, S1 and S2 normal, no murmur, rubor gallop Abdomen: Soft, non-tender, nondistended, normoactive bowel sounds,  no masses, no hepatosplenomegaly Extremities: No clubbing, cyanosis or edema Pulses: 2+ and symmetric all extremities Skin:  Skin color, texture, turgor normal, no rashes or lesions Lymph nodes: Cervical, supraclavicular nodes normal Neurologic:  CNII-XII intact, normal strength, sensation and gait; reflexes 2+ and symmetric throughout Psych: Normal mood, affect, hygiene and grooming.  ASSESSMENT/PLAN: Fall (on) (from) other stairs and steps, initial encounter  Chronic back pain, unspecified back location, unspecified back pain laterality  Fibromyalgia  Primary localized osteoarthritis of right hip  Mild persistent chronic asthma  without complication - Plan: Fluticasone-Salmeterol (ADVAIR DISKUS) 500-50 MCG/DOSE AEPB  Mixed hyperlipidemia - Plan: Lipid panel  Asthma, chronic, mild persistent, uncomplicated - Plan: montelukast (SINGULAIR) 10 MG tablet, Fluticasone-Salmeterol (ADVAIR DISKUS) 500-50 MCG/DOSE AEPB  Gastroesophageal reflux disease without esophagitis  Rheumatoid arthritis of both hips, unspecified whether rheumatoid factor present ([place])  Essential hypertension - Plan: CBC with Differential, Comprehensive metabolic panel  Vitamin D deficiency - Plan: Vitamin D 25 hydroxy  Tinnitus, unspecified laterality  Personal history of colonic polyp - adenoma  Osteopenia, unspecified location  Renal cell carcinoma of right kidney (HCC)  Rib pain on right side - Plan: DG Chest 2 View  Arthritis of right knee  Soiling She is quite stable with all the problems that she has and is mainly dealing with back pain.  Her cardiac status is stable.  She has her pain under good control.  Reflux is not causing her any trouble.  recommend she do get follow-up recommend she get follow-up with GI concerning her soiling.  She is already on pain medication but dowant to document whether she has a fracture or not     Medicare Attestation I have personally reviewed: The patient's medical and social history Their use of alcohol, tobacco or illicit drugs Their current medications and supplements The patient's functional ability including ADLs,fall risks, home safety risks, cognitive, and hearing and visual impairment Diet and physical activities Evidence for depression or mood disorders  The patient's weight, height, and BMI have been recorded in the chart.  I have made referrals, counseling, and provided education to the patient based on review of the above and I have provided the patient with  a written personalized care plan for preventive services.     Jill Alexanders, MD   12/05/2018

## 2018-12-06 LAB — COMPREHENSIVE METABOLIC PANEL
ALT: 14 IU/L (ref 0–32)
AST: 21 IU/L (ref 0–40)
Albumin/Globulin Ratio: 2.1 (ref 1.2–2.2)
Albumin: 4.7 g/dL (ref 3.7–4.7)
Alkaline Phosphatase: 129 IU/L — ABNORMAL HIGH (ref 39–117)
BUN/Creatinine Ratio: 28 (ref 12–28)
BUN: 23 mg/dL (ref 8–27)
Bilirubin Total: 0.4 mg/dL (ref 0.0–1.2)
CO2: 23 mmol/L (ref 20–29)
Calcium: 9.2 mg/dL (ref 8.7–10.3)
Chloride: 103 mmol/L (ref 96–106)
Creatinine, Ser: 0.82 mg/dL (ref 0.57–1.00)
GFR calc Af Amer: 80 mL/min/{1.73_m2} (ref >59)
GFR calc non Af Amer: 69 mL/min/{1.73_m2} (ref >59)
Globulin, Total: 2.2 g/dL (ref 1.5–4.5)
Glucose: 102 mg/dL — ABNORMAL HIGH (ref 65–99)
Potassium: 4.6 mmol/L (ref 3.5–5.2)
Sodium: 143 mmol/L (ref 134–144)
Total Protein: 6.9 g/dL (ref 6.0–8.5)

## 2018-12-06 LAB — CBC WITH DIFFERENTIAL/PLATELET
Basophils Absolute: 0.1 10*3/uL (ref 0.0–0.2)
Basos: 1 %
EOS (ABSOLUTE): 0.2 10*3/uL (ref 0.0–0.4)
Eos: 2 %
Hematocrit: 41.3 % (ref 34.0–46.6)
Hemoglobin: 13.6 g/dL (ref 11.1–15.9)
Immature Grans (Abs): 0 10*3/uL (ref 0.0–0.1)
Immature Granulocytes: 0 %
Lymphocytes Absolute: 2.2 10*3/uL (ref 0.7–3.1)
Lymphs: 30 %
MCH: 29.8 pg (ref 26.6–33.0)
MCHC: 32.9 g/dL (ref 31.5–35.7)
MCV: 90 fL (ref 79–97)
Monocytes Absolute: 0.5 10*3/uL (ref 0.1–0.9)
Monocytes: 7 %
Neutrophils Absolute: 4.6 10*3/uL (ref 1.4–7.0)
Neutrophils: 60 %
Platelets: 185 10*3/uL (ref 150–450)
RBC: 4.57 x10E6/uL (ref 3.77–5.28)
RDW: 12.6 % (ref 11.7–15.4)
WBC: 7.5 10*3/uL (ref 3.4–10.8)

## 2018-12-06 LAB — LIPID PANEL
Chol/HDL Ratio: 2 ratio (ref 0.0–4.4)
Cholesterol, Total: 160 mg/dL (ref 100–199)
HDL: 79 mg/dL (ref >39)
LDL Chol Calc (NIH): 68 mg/dL (ref 0–99)
Triglycerides: 64 mg/dL (ref 0–149)
VLDL Cholesterol Cal: 13 mg/dL (ref 5–40)

## 2018-12-06 LAB — VITAMIN D 25 HYDROXY (VIT D DEFICIENCY, FRACTURES): Vit D, 25-Hydroxy: 29.6 ng/mL — ABNORMAL LOW (ref 30.0–100.0)

## 2018-12-09 ENCOUNTER — Telehealth: Payer: Self-pay

## 2018-12-09 DIAGNOSIS — J4530 Mild persistent asthma, uncomplicated: Secondary | ICD-10-CM

## 2018-12-09 DIAGNOSIS — M25561 Pain in right knee: Secondary | ICD-10-CM | POA: Diagnosis not present

## 2018-12-09 MED ORDER — MONTELUKAST SODIUM 10 MG PO TABS: 10.0000 mg | ORAL_TABLET | Freq: Every day | ORAL | 1 refills | Status: DC

## 2018-12-09 MED ORDER — FLUTICASONE-SALMETEROL 500-50 MCG/DOSE IN AEPB: 1.0000 | INHALATION_SPRAY | Freq: Two times a day (BID) | RESPIRATORY_TRACT | 3 refills | Status: DC

## 2018-12-09 NOTE — Telephone Encounter (Signed)
Pt advised that she would like her advair 50-500 and singular 10 mg to [employer]. She also advised that she can only afford one inhaler at a time but the refills can be added to script to cover her for 90 days. Thanks [employer]

## 2018-12-09 NOTE — Addendum Note (Signed)
Addended by: Denita Lung on: 12/09/2018 04:41 PM   Modules accepted: Orders

## 2018-12-17 DIAGNOSIS — M25561 Pain in right knee: Secondary | ICD-10-CM | POA: Diagnosis not present

## 2018-12-23 DIAGNOSIS — M25561 Pain in right knee: Secondary | ICD-10-CM | POA: Diagnosis not present

## 2019-01-22 ENCOUNTER — Telehealth: Payer: Self-pay

## 2019-01-22 NOTE — Telephone Encounter (Signed)
    [facility] Pre-operative Risk Assessment    Request for surgical clearance:  1. What type of surgery is being performed? RIGHT KNEE ARTHROSCOPY   2. When is this surgery scheduled? TBD   3. What type of clearance is required (medical clearance vs. Pharmacy clearance to hold med vs. Both)? MEDICAL  4. Are there any medications that need to be held prior to surgery and how long? NONE   5. Practice name and name of physician performing surgery?  [facility]   ATTN: [place]  6. What is your office phone number [phone]    7.   What is your office fax number [phone]  8.   Anesthesia type (None, local, MAC, general) ?  MAC ANESTHESIA WITH A BLOCK

## 2019-01-23 NOTE — Telephone Encounter (Signed)
   Primary Cardiologist: Quay Burow, MD  Chart reviewed as part of pre-operative protocol coverage. Patient was contacted 01/23/2019 in reference to pre-operative risk assessment for pending surgery as outlined below.  [name] Long was last seen on 10/04/2018 by Dr. Gwenlyn Found.  Since that day, [name] Long has done well with no new cardiac complaints.  Therefore, based on ACC/AHA guidelines, the patient would be at acceptable risk for the planned procedure without further cardiovascular testing.   I will route this recommendation to the requesting party via Epic fax function and remove from pre-op pool.  Please call with questions.  Daune Perch, NP 01/23/2019, 10:13 AM

## 2019-01-28 ENCOUNTER — Ambulatory Visit: Payer: PPO

## 2019-02-03 DIAGNOSIS — M150 Primary generalized (osteo)arthritis: Secondary | ICD-10-CM | POA: Diagnosis not present

## 2019-02-03 DIAGNOSIS — G894 Chronic pain syndrome: Secondary | ICD-10-CM | POA: Diagnosis not present

## 2019-02-03 DIAGNOSIS — M47812 Spondylosis without myelopathy or radiculopathy, cervical region: Secondary | ICD-10-CM | POA: Diagnosis not present

## 2019-02-03 DIAGNOSIS — Z79891 Long term (current) use of opiate analgesic: Secondary | ICD-10-CM | POA: Diagnosis not present

## 2019-02-06 ENCOUNTER — Ambulatory Visit: Payer: PPO | Attending: Internal Medicine

## 2019-02-06 DIAGNOSIS — Z23 Encounter for immunization: Secondary | ICD-10-CM | POA: Insufficient documentation

## 2019-02-06 NOTE — Progress Notes (Signed)
   Covid-19 Vaccination Clinic  Name:  [name] Long    MRN: [id number] DOB: 1940/06/06  02/06/2019  [name] was observed post Covid-19 immunization for 15 minutes without incidence. She was provided with Vaccine Information Sheet and instruction to access the V-Safe system.   [name] was instructed to call 911 with any severe reactions post vaccine: Marland Kitchen Difficulty breathing  . Swelling of your face and throat  . A fast heartbeat  . A bad rash all over your body  . Dizziness and weakness    Immunizations Administered    Name Date Dose VIS Date Route   Pfizer COVID-19 Vaccine 02/06/2019  8:56 AM 0.3 mL 12/13/2018 Intramuscular   Manufacturer: [place]   Lot: [id number]   [place]: [id number]

## 2019-02-13 DIAGNOSIS — S83231A Complex tear of medial meniscus, current injury, right knee, initial encounter: Secondary | ICD-10-CM | POA: Diagnosis not present

## 2019-02-13 DIAGNOSIS — M2241 Chondromalacia patellae, right knee: Secondary | ICD-10-CM | POA: Diagnosis not present

## 2019-02-21 DIAGNOSIS — Z9889 Other specified postprocedural states: Secondary | ICD-10-CM | POA: Diagnosis not present

## 2019-02-26 DIAGNOSIS — R531 Weakness: Secondary | ICD-10-CM | POA: Diagnosis not present

## 2019-02-26 DIAGNOSIS — Z9889 Other specified postprocedural states: Secondary | ICD-10-CM | POA: Diagnosis not present

## 2019-02-26 DIAGNOSIS — M6281 Muscle weakness (generalized): Secondary | ICD-10-CM | POA: Diagnosis not present

## 2019-02-26 DIAGNOSIS — R2689 Other abnormalities of gait and mobility: Secondary | ICD-10-CM | POA: Diagnosis not present

## 2019-02-27 DIAGNOSIS — Z9889 Other specified postprocedural states: Secondary | ICD-10-CM | POA: Diagnosis not present

## 2019-02-27 DIAGNOSIS — M6281 Muscle weakness (generalized): Secondary | ICD-10-CM | POA: Diagnosis not present

## 2019-02-27 DIAGNOSIS — R2689 Other abnormalities of gait and mobility: Secondary | ICD-10-CM | POA: Diagnosis not present

## 2019-02-27 DIAGNOSIS — R531 Weakness: Secondary | ICD-10-CM | POA: Diagnosis not present

## 2019-03-03 ENCOUNTER — Ambulatory Visit: Payer: PPO | Attending: Internal Medicine

## 2019-03-03 DIAGNOSIS — Z23 Encounter for immunization: Secondary | ICD-10-CM | POA: Insufficient documentation

## 2019-03-03 NOTE — Progress Notes (Signed)
   Covid-19 Vaccination Clinic  Name:  [name] Long    MRN: [id number] DOB: 1940-02-16  03/03/2019  [name] Long was observed post Covid-19 immunization for 15 minutes without incidence. She was provided with Vaccine Information Sheet and instruction to access the V-Safe system.   [name] Long was instructed to call 911 with any severe reactions post vaccine: Marland Kitchen Difficulty breathing  . Swelling of your face and throat  . A fast heartbeat  . A bad rash all over your body  . Dizziness and weakness    Immunizations Administered    Name Date Dose VIS Date Route   Pfizer COVID-19 Vaccine 03/03/2019  1:16 PM 0.3 mL 12/13/2018 Intramuscular   Manufacturer: [place]   Lot: [id number]   [place]: [id number]

## 2019-03-04 DIAGNOSIS — R2689 Other abnormalities of gait and mobility: Secondary | ICD-10-CM | POA: Diagnosis not present

## 2019-03-04 DIAGNOSIS — Z9889 Other specified postprocedural states: Secondary | ICD-10-CM | POA: Diagnosis not present

## 2019-03-04 DIAGNOSIS — R531 Weakness: Secondary | ICD-10-CM | POA: Diagnosis not present

## 2019-03-04 DIAGNOSIS — M6281 Muscle weakness (generalized): Secondary | ICD-10-CM | POA: Diagnosis not present

## 2019-03-06 DIAGNOSIS — R531 Weakness: Secondary | ICD-10-CM | POA: Diagnosis not present

## 2019-03-06 DIAGNOSIS — M6281 Muscle weakness (generalized): Secondary | ICD-10-CM | POA: Diagnosis not present

## 2019-03-06 DIAGNOSIS — R2689 Other abnormalities of gait and mobility: Secondary | ICD-10-CM | POA: Diagnosis not present

## 2019-03-06 DIAGNOSIS — Z9889 Other specified postprocedural states: Secondary | ICD-10-CM | POA: Diagnosis not present

## 2019-03-10 DIAGNOSIS — Z1231 Encounter for screening mammogram for malignant neoplasm of breast: Secondary | ICD-10-CM | POA: Diagnosis not present

## 2019-03-10 LAB — HM MAMMOGRAPHY

## 2019-03-11 DIAGNOSIS — M6281 Muscle weakness (generalized): Secondary | ICD-10-CM | POA: Diagnosis not present

## 2019-03-11 DIAGNOSIS — R531 Weakness: Secondary | ICD-10-CM | POA: Diagnosis not present

## 2019-03-11 DIAGNOSIS — Z9889 Other specified postprocedural states: Secondary | ICD-10-CM | POA: Diagnosis not present

## 2019-03-11 DIAGNOSIS — R2689 Other abnormalities of gait and mobility: Secondary | ICD-10-CM | POA: Diagnosis not present

## 2019-03-13 DIAGNOSIS — M6281 Muscle weakness (generalized): Secondary | ICD-10-CM | POA: Diagnosis not present

## 2019-03-13 DIAGNOSIS — R531 Weakness: Secondary | ICD-10-CM | POA: Diagnosis not present

## 2019-03-13 DIAGNOSIS — R2689 Other abnormalities of gait and mobility: Secondary | ICD-10-CM | POA: Diagnosis not present

## 2019-03-13 DIAGNOSIS — Z9889 Other specified postprocedural states: Secondary | ICD-10-CM | POA: Diagnosis not present

## 2019-03-14 ENCOUNTER — Other Ambulatory Visit: Payer: Self-pay | Admitting: Cardiovascular Disease

## 2019-03-14 DIAGNOSIS — R928 Other abnormal and inconclusive findings on diagnostic imaging of breast: Secondary | ICD-10-CM | POA: Diagnosis not present

## 2019-03-14 DIAGNOSIS — N6489 Other specified disorders of breast: Secondary | ICD-10-CM | POA: Diagnosis not present

## 2019-03-14 LAB — HM MAMMOGRAPHY

## 2019-03-17 DIAGNOSIS — Z9889 Other specified postprocedural states: Secondary | ICD-10-CM | POA: Diagnosis not present

## 2019-03-18 ENCOUNTER — Encounter: Payer: Self-pay | Admitting: Family Medicine

## 2019-03-18 DIAGNOSIS — R531 Weakness: Secondary | ICD-10-CM | POA: Diagnosis not present

## 2019-03-18 DIAGNOSIS — R2689 Other abnormalities of gait and mobility: Secondary | ICD-10-CM | POA: Diagnosis not present

## 2019-03-18 DIAGNOSIS — M6281 Muscle weakness (generalized): Secondary | ICD-10-CM | POA: Diagnosis not present

## 2019-03-18 DIAGNOSIS — Z9889 Other specified postprocedural states: Secondary | ICD-10-CM | POA: Diagnosis not present

## 2019-03-20 DIAGNOSIS — M6281 Muscle weakness (generalized): Secondary | ICD-10-CM | POA: Diagnosis not present

## 2019-03-20 DIAGNOSIS — R531 Weakness: Secondary | ICD-10-CM | POA: Diagnosis not present

## 2019-03-20 DIAGNOSIS — R2689 Other abnormalities of gait and mobility: Secondary | ICD-10-CM | POA: Diagnosis not present

## 2019-03-20 DIAGNOSIS — Z9889 Other specified postprocedural states: Secondary | ICD-10-CM | POA: Diagnosis not present

## 2019-03-24 DIAGNOSIS — R2689 Other abnormalities of gait and mobility: Secondary | ICD-10-CM | POA: Diagnosis not present

## 2019-03-24 DIAGNOSIS — R531 Weakness: Secondary | ICD-10-CM | POA: Diagnosis not present

## 2019-03-24 DIAGNOSIS — Z9889 Other specified postprocedural states: Secondary | ICD-10-CM | POA: Diagnosis not present

## 2019-03-24 DIAGNOSIS — M6281 Muscle weakness (generalized): Secondary | ICD-10-CM | POA: Diagnosis not present

## 2019-03-26 ENCOUNTER — Other Ambulatory Visit: Payer: Self-pay | Admitting: Radiology

## 2019-03-26 DIAGNOSIS — N651 Disproportion of reconstructed breast: Secondary | ICD-10-CM | POA: Diagnosis not present

## 2019-03-26 DIAGNOSIS — R928 Other abnormal and inconclusive findings on diagnostic imaging of breast: Secondary | ICD-10-CM | POA: Diagnosis not present

## 2019-03-26 LAB — HM MAMMOGRAPHY

## 2019-03-27 DIAGNOSIS — R531 Weakness: Secondary | ICD-10-CM | POA: Diagnosis not present

## 2019-03-27 DIAGNOSIS — R2689 Other abnormalities of gait and mobility: Secondary | ICD-10-CM | POA: Diagnosis not present

## 2019-03-27 DIAGNOSIS — Z9889 Other specified postprocedural states: Secondary | ICD-10-CM | POA: Diagnosis not present

## 2019-03-27 DIAGNOSIS — M6281 Muscle weakness (generalized): Secondary | ICD-10-CM | POA: Diagnosis not present

## 2019-03-27 HISTORY — PX: BIOPSY BREAST: PRO8

## 2019-04-01 DIAGNOSIS — R2689 Other abnormalities of gait and mobility: Secondary | ICD-10-CM | POA: Diagnosis not present

## 2019-04-01 DIAGNOSIS — M6281 Muscle weakness (generalized): Secondary | ICD-10-CM | POA: Diagnosis not present

## 2019-04-01 DIAGNOSIS — R531 Weakness: Secondary | ICD-10-CM | POA: Diagnosis not present

## 2019-04-01 DIAGNOSIS — Z9889 Other specified postprocedural states: Secondary | ICD-10-CM | POA: Diagnosis not present

## 2019-04-08 DIAGNOSIS — R531 Weakness: Secondary | ICD-10-CM | POA: Diagnosis not present

## 2019-04-08 DIAGNOSIS — M6281 Muscle weakness (generalized): Secondary | ICD-10-CM | POA: Diagnosis not present

## 2019-04-08 DIAGNOSIS — R2689 Other abnormalities of gait and mobility: Secondary | ICD-10-CM | POA: Diagnosis not present

## 2019-04-08 DIAGNOSIS — Z9889 Other specified postprocedural states: Secondary | ICD-10-CM | POA: Diagnosis not present

## 2019-04-10 ENCOUNTER — Encounter: Payer: Self-pay | Admitting: Family Medicine

## 2019-04-10 DIAGNOSIS — R531 Weakness: Secondary | ICD-10-CM | POA: Diagnosis not present

## 2019-04-10 DIAGNOSIS — R2689 Other abnormalities of gait and mobility: Secondary | ICD-10-CM | POA: Diagnosis not present

## 2019-04-10 DIAGNOSIS — Z9889 Other specified postprocedural states: Secondary | ICD-10-CM | POA: Diagnosis not present

## 2019-04-10 DIAGNOSIS — M6281 Muscle weakness (generalized): Secondary | ICD-10-CM | POA: Diagnosis not present

## 2019-04-14 DIAGNOSIS — M25561 Pain in right knee: Secondary | ICD-10-CM | POA: Diagnosis not present

## 2019-04-14 DIAGNOSIS — Z9889 Other specified postprocedural states: Secondary | ICD-10-CM | POA: Diagnosis not present

## 2019-04-15 DIAGNOSIS — Z9889 Other specified postprocedural states: Secondary | ICD-10-CM | POA: Diagnosis not present

## 2019-04-15 DIAGNOSIS — R531 Weakness: Secondary | ICD-10-CM | POA: Diagnosis not present

## 2019-04-15 DIAGNOSIS — M6281 Muscle weakness (generalized): Secondary | ICD-10-CM | POA: Diagnosis not present

## 2019-04-15 DIAGNOSIS — R2689 Other abnormalities of gait and mobility: Secondary | ICD-10-CM | POA: Diagnosis not present

## 2019-05-05 DIAGNOSIS — M150 Primary generalized (osteo)arthritis: Secondary | ICD-10-CM | POA: Diagnosis not present

## 2019-05-05 DIAGNOSIS — Z79891 Long term (current) use of opiate analgesic: Secondary | ICD-10-CM | POA: Diagnosis not present

## 2019-05-05 DIAGNOSIS — G894 Chronic pain syndrome: Secondary | ICD-10-CM | POA: Diagnosis not present

## 2019-05-05 DIAGNOSIS — M47812 Spondylosis without myelopathy or radiculopathy, cervical region: Secondary | ICD-10-CM | POA: Diagnosis not present

## 2019-05-12 DIAGNOSIS — M25561 Pain in right knee: Secondary | ICD-10-CM | POA: Diagnosis not present

## 2019-06-06 ENCOUNTER — Other Ambulatory Visit: Payer: Self-pay | Admitting: Orthopaedic Surgery

## 2019-06-24 ENCOUNTER — Telehealth: Payer: Self-pay | Admitting: Cardiovascular Disease

## 2019-06-24 NOTE — Telephone Encounter (Signed)
   [facility] Pre-operative Risk Assessment    HEARTCARE STAFF: - Please ensure there is not already an duplicate clearance open for this procedure. - Under Visit Info/Reason for Call, type in Other and utilize the format Clearance MM/DD/YY or Clearance TBD. Do not use dashes or single digits. - If request is for dental extraction, please clarify the # of teeth to be extracted.  Request for surgical clearance:  1. What type of surgery is being performed? Rt Knee Orthoplasty   2. When is this surgery scheduled? 07-22-19   3. What type of clearance is required (medical clearance vs. Pharmacy clearance to hold med vs. Both)? Both  4. Are there any medications that need to be held prior to surgery and how long?    5. Practice name and name of physician performing surgery?  Dr Melrose Nakayama  6. What is the office phone number? [phone]   7.   What is the office fax number?  [phone]  8.   Anesthesia type (None, local, MAC, general) ? [place]   Glyn Ade 06/24/2019, 11:21 AM  _________________________________________________________________   (provider comments below)

## 2019-06-24 NOTE — Telephone Encounter (Signed)
Pt agreeable to plan of care to pre op appt. I have scheduled pt to see Dr. Gwenlyn Found 07/09/19 @ 2:15. I will send clearance notes to MD for upcoming appt. I will send FYI to Dr. Melrose Nakayama pt has appt with her cardiologist for pre op assessment. I will remove from the pre op call back pool.

## 2019-06-24 NOTE — Telephone Encounter (Signed)
   Primary Cardiologist:Jonathan Gwenlyn Found, MD  Chart reviewed as part of pre-operative protocol coverage. Because of SHANAYA SCHNECK past medical history and time since last visit, they will require a follow-up visit in order to better assess preoperative cardiovascular risk.  Pre-op covering staff: - Please schedule appointment and call patient to inform them. If patient already had an upcoming appointment within acceptable timeframe, please add "pre-op clearance" to the appointment notes so provider is aware. - Please contact requesting surgeon's office via preferred method (i.e, phone, fax) to inform them of need for appointment prior to surgery.  If applicable, this message will also be routed to pharmacy pool and/or primary cardiologist for input on holding anticoagulant/antiplatelet agent as requested below so that this information is available to the clearing provider at time of patient's appointment.   [place], [place]  06/24/2019, 11:48 AM

## 2019-07-08 NOTE — Patient Instructions (Addendum)
DUE TO COVID-19 ONLY ONE VISITOR IS ALLOWED TO COME WITH YOU AND STAY IN THE WAITING ROOM ONLY DURING PRE OP AND PROCEDURE DAY OF SURGERY. THE 2 VISITORS  MAY VISIT WITH YOU AFTER SURGERY IN YOUR PRIVATE ROOM DURING VISITING HOURS ONLY!  YOU NEED TO HAVE A COVID 19 TEST ON___7/16/21____ @_8 :25______, THIS TEST MUST BE DONE BEFORE SURGERY, COME  [place] , [id number].  ([place]) ONCE YOUR COVID TEST IS COMPLETED, PLEASE BEGIN THE QUARANTINE INSTRUCTIONS AS OUTLINED IN YOUR HANDOUT.                [name] Long    Your procedure is scheduled on: 07/22/19   Report to [facility] Main  Entrance   Report to admitting at  7:40 AM     Call this number if you have problems the morning of surgery [place], NO CHEWING GUM [place].   Do not eat food After Midnight  . YOU MAY HAVE CLEAR LIQUIDS FROM MIDNIGHT UNTIL 7:00 AM  . At 7:00 AM Please finish the prescribed Pre-Surgery  drink  . Nothing by mouth after you finish the  drink !   Take these medicines the morning of surgery with A SIP OF WATER: Metoprolol, Silodosin, Pain med if needed,  Use your inhalers and bring them with you to the hospital                                 You may not have any metal on your body including hair pins and              piercings  Do not wear jewelry, make-up, lotions, powders or perfumes, deodorant             Do not wear nail polish on your fingernails.  Do not shave  48 hours prior to surgery.            .   Do not bring valuables to the hospital. [place].  Contacts, dentures or bridgework may not be worn into surgery.      Patients discharged the day of surgery will not be allowed to drive home.   IF YOU ARE HAVING SURGERY AND GOING HOME THE SAME DAY, YOU MUST HAVE AN ADULT TO DRIVE YOU HOME AND BE WITH YOU FOR 24 HOURS.    YOU MAY GO HOME BY TAXI OR UBER OR ORTHERWISE, BUT AN ADULT MUST ACCOMPANY YOU HOME AND STAY WITH YOU FOR 24 HOURS.  Name and phone number of your driver:  Special Instructions: N/A              Please read over the following fact sheets you were given: _____________________________________________________________________             [facility] - Preparing for Surgery  Before surgery, you can play an important role.   Because skin is not sterile, your skin needs to be as free of germs as possible.   You can reduce the number of germs on your skin by washing with CHG (chlorahexidine gluconate) soap before surgery.   CHG is an antiseptic cleaner which kills germs and bonds with the skin to continue killing germs even  after washing. Please DO NOT use if you have an allergy to CHG or antibacterial soaps.   If your skin becomes reddened/irritated stop using the CHG and inform your nurse when you arrive at Short Stay. Do not shave (including legs and underarms) for at least 48 hours prior to the first CHG shower.   Please follow these instructions carefully:  1.  Shower with CHG Soap the night before surgery and the  morning of Surgery.  2.  If you choose to wash your hair, wash your hair first as usual with your  normal  shampoo.  3.  After you shampoo, rinse your hair and body thoroughly to remove the  shampoo.                                        4.  Use CHG as you would any other liquid soap.  You can apply chg directly  to the skin and wash                       Gently with a scrungie or clean washcloth.  5.  Apply the CHG Soap to your body ONLY FROM THE NECK DOWN.   Do not use on face/ open                           Wound or open sores. Avoid contact with eyes, ears mouth and genitals (private parts).                       Wash face,  Genitals (private parts) with your normal soap.             6.  Wash thoroughly, paying special attention to the area where your surgery  will be  performed.  7.  Thoroughly rinse your body with warm water from the neck down.  8.  DO NOT shower/wash with your normal soap after using and rinsing off  the CHG Soap.             9.  Pat yourself dry with a clean towel.            10.  Wear clean pajamas.            11.  Place clean sheets on your bed the night of your first shower and do not  sleep with pets. Day of Surgery : Do not apply any lotions/deodorants the morning of surgery.  Please wear clean clothes to the hospital/surgery center.  FAILURE TO FOLLOW THESE INSTRUCTIONS MAY RESULT IN THE CANCELLATION OF YOUR SURGERY PATIENT SIGNATURE_________________________________  NURSE SIGNATURE__________________________________  ________________________________________________________________________   [name] Long  An incentive spirometer is a tool that can help keep your lungs clear and active. This tool measures how well you are filling your lungs with each breath. Taking long deep breaths may help reverse or decrease the chance of developing breathing (pulmonary) problems (especially infection) following:  A long period of time when you are unable to move or be active. BEFORE THE PROCEDURE   If the spirometer includes an indicator to show your best effort, your nurse or respiratory therapist will set it to a desired goal.  If possible, sit up straight or lean slightly forward. Try not to slouch.  Hold the incentive spirometer in an upright position. INSTRUCTIONS FOR USE  1. Sit on the edge of your bed if possible, or sit up as far as you can in bed or on a chair. 2. Hold the incentive spirometer in an upright position. 3. Breathe out normally. 4. Place the mouthpiece in your mouth and seal your lips tightly around it. 5. Breathe in slowly and as deeply as possible, raising the piston or the ball toward the top of the column. 6. Hold your breath for 3-5 seconds or for as long as possible. Allow the piston or ball to fall  to the bottom of the column. 7. Remove the mouthpiece from your mouth and breathe out normally. 8. Rest for a few seconds and repeat Steps 1 through 7 at least 10 times every 1-2 hours when you are awake. Take your time and take a few normal breaths between deep breaths. 9. The spirometer may include an indicator to show your best effort. Use the indicator as a goal to work toward during each repetition. 10. After each set of 10 deep breaths, practice coughing to be sure your lungs are clear. If you have an incision (the cut made at the time of surgery), support your incision when coughing by placing a pillow or rolled up towels firmly against it. Once you are able to get out of bed, walk around indoors and cough well. You may stop using the incentive spirometer when instructed by your caregiver.  RISKS AND COMPLICATIONS  Take your time so you do not get dizzy or light-headed.  If you are in pain, you may need to take or ask for pain medication before doing incentive spirometry. It is harder to take a deep breath if you are having pain. AFTER USE  Rest and breathe slowly and easily.  It can be helpful to keep track of a log of your progress. Your caregiver can provide you with a simple table to help with this. If you are using the spirometer at home, follow these instructions: [place] IF:   You are having difficultly using the spirometer.  You have trouble using the spirometer as often as instructed.  Your pain medication is not giving enough relief while using the spirometer.  You develop fever of 100.5 F (38.1 C) or higher. SEEK IMMEDIATE MEDICAL CARE IF:   You cough up bloody sputum that had not been present before.  You develop fever of 102 F (38.9 C) or greater.  You develop worsening pain at or near the incision site. MAKE SURE YOU:   Understand these instructions.  Will watch your condition.  Will get help right away if you are not doing well or get  worse. Document Released: 05/01/2006 Document Revised: 03/13/2011 Document Reviewed: 07/02/2006 [facility] Patient Information 2014 [place], [place].   ________________________________________________________________________

## 2019-07-09 ENCOUNTER — Encounter: Payer: Self-pay | Admitting: *Deleted

## 2019-07-09 ENCOUNTER — Encounter: Payer: Self-pay | Admitting: Cardiovascular Disease

## 2019-07-09 ENCOUNTER — Encounter (HOSPITAL_COMMUNITY)
Admission: RE | Admit: 2019-07-09 | Discharge: 2019-07-09 | Disposition: A | Discharge status: Home / Self Care | Payer: PPO | Source: Ambulatory Visit | Attending: Orthopaedic Surgery | Admitting: Orthopaedic Surgery

## 2019-07-09 ENCOUNTER — Other Ambulatory Visit: Payer: Self-pay

## 2019-07-09 ENCOUNTER — Ambulatory Visit: Payer: PPO | Admitting: Cardiovascular Disease

## 2019-07-09 ENCOUNTER — Encounter (HOSPITAL_COMMUNITY): Payer: Self-pay

## 2019-07-09 ENCOUNTER — Ambulatory Visit (HOSPITAL_COMMUNITY)
Admission: RE | Admit: 2019-07-09 | Discharge: 2019-07-09 | Disposition: A | Discharge status: Home / Self Care | Payer: PPO | Source: Ambulatory Visit | Attending: Orthopaedic Surgery | Admitting: Orthopaedic Surgery

## 2019-07-09 DIAGNOSIS — Z87891 Personal history of nicotine dependence: Secondary | ICD-10-CM | POA: Diagnosis not present

## 2019-07-09 DIAGNOSIS — Z96641 Presence of right artificial hip joint: Secondary | ICD-10-CM | POA: Insufficient documentation

## 2019-07-09 DIAGNOSIS — E782 Mixed hyperlipidemia: Secondary | ICD-10-CM | POA: Diagnosis not present

## 2019-07-09 DIAGNOSIS — I10 Essential (primary) hypertension: Secondary | ICD-10-CM | POA: Diagnosis not present

## 2019-07-09 DIAGNOSIS — Z7951 Long term (current) use of inhaled steroids: Secondary | ICD-10-CM | POA: Insufficient documentation

## 2019-07-09 DIAGNOSIS — K219 Gastro-esophageal reflux disease without esophagitis: Secondary | ICD-10-CM | POA: Diagnosis not present

## 2019-07-09 DIAGNOSIS — M1711 Unilateral primary osteoarthritis, right knee: Secondary | ICD-10-CM | POA: Diagnosis not present

## 2019-07-09 DIAGNOSIS — J45909 Unspecified asthma, uncomplicated: Secondary | ICD-10-CM | POA: Insufficient documentation

## 2019-07-09 DIAGNOSIS — Z951 Presence of aortocoronary bypass graft: Secondary | ICD-10-CM

## 2019-07-09 DIAGNOSIS — F17200 Nicotine dependence, unspecified, uncomplicated: Secondary | ICD-10-CM | POA: Diagnosis not present

## 2019-07-09 DIAGNOSIS — Z01818 Encounter for other preprocedural examination: Secondary | ICD-10-CM

## 2019-07-09 DIAGNOSIS — I2510 Atherosclerotic heart disease of native coronary artery without angina pectoris: Secondary | ICD-10-CM | POA: Insufficient documentation

## 2019-07-09 DIAGNOSIS — Z7982 Long term (current) use of aspirin: Secondary | ICD-10-CM | POA: Insufficient documentation

## 2019-07-09 DIAGNOSIS — M797 Fibromyalgia: Secondary | ICD-10-CM | POA: Insufficient documentation

## 2019-07-09 DIAGNOSIS — Z79899 Other long term (current) drug therapy: Secondary | ICD-10-CM | POA: Insufficient documentation

## 2019-07-09 LAB — APTT: aPTT: 33 seconds (ref 24–36)

## 2019-07-09 LAB — BASIC METABOLIC PANEL
Anion gap: 8 (ref 5–15)
BUN: 19 mg/dL (ref 8–23)
CO2: 30 mmol/L (ref 22–32)
Calcium: 9.1 mg/dL (ref 8.9–10.3)
Chloride: 104 mmol/L (ref 98–111)
Creatinine, Ser: 0.91 mg/dL (ref 0.44–1.00)
GFR calc Af Amer: >60 mL/min (ref >60)
GFR calc non Af Amer: >60 mL/min (ref >60)
Glucose, Bld: 116 mg/dL — ABNORMAL HIGH (ref 70–99)
Potassium: 4.4 mmol/L (ref 3.5–5.1)
Sodium: 142 mmol/L (ref 135–145)

## 2019-07-09 LAB — CBC WITH DIFFERENTIAL/PLATELET
Abs Immature Granulocytes: 0.02 10*3/uL (ref 0.00–0.07)
Basophils Absolute: 0 10*3/uL (ref 0.0–0.1)
Basophils Relative: 1 %
Eosinophils Absolute: 0.2 10*3/uL (ref 0.0–0.5)
Eosinophils Relative: 3 %
HCT: 41.9 % (ref 36.0–46.0)
Hemoglobin: 13 g/dL (ref 12.0–15.0)
Immature Granulocytes: 0 %
Lymphocytes Relative: 21 %
Lymphs Abs: 1.2 10*3/uL (ref 0.7–4.0)
MCH: 29.5 pg (ref 26.0–34.0)
MCHC: 31 g/dL (ref 30.0–36.0)
MCV: 95 fL (ref 80.0–100.0)
Monocytes Absolute: 0.5 10*3/uL (ref 0.1–1.0)
Monocytes Relative: 9 %
Neutro Abs: 4 10*3/uL (ref 1.7–7.7)
Neutrophils Relative %: 66 %
Platelets: 156 10*3/uL (ref 150–400)
RBC: 4.41 MIL/uL (ref 3.87–5.11)
RDW: 14.1 % (ref 11.5–15.5)
WBC: 6 10*3/uL (ref 4.0–10.5)
nRBC: 0 % (ref 0.0–0.2)

## 2019-07-09 LAB — URINALYSIS, ROUTINE W REFLEX MICROSCOPIC
Bilirubin Urine: NEGATIVE
Glucose, UA: NEGATIVE mg/dL
Hgb urine dipstick: NEGATIVE
Ketones, ur: NEGATIVE mg/dL
Nitrite: NEGATIVE
Protein, ur: NEGATIVE mg/dL
Specific Gravity, Urine: 1.009 (ref 1.005–1.030)
pH: 7 (ref 5.0–8.0)

## 2019-07-09 LAB — TYPE AND SCREEN
ABO/RH(D): O POS
Antibody Screen: NEGATIVE

## 2019-07-09 LAB — PROTIME-INR
INR: 0.9 (ref 0.8–1.2)
Prothrombin Time: 11.9 seconds (ref 11.4–15.2)

## 2019-07-09 LAB — SURGICAL PCR SCREEN
MRSA, PCR: NEGATIVE
Staphylococcus aureus: NEGATIVE

## 2019-07-09 NOTE — Assessment & Plan Note (Signed)
History of essential hypertension blood pressure measured today 140/83. She is on metoprolol.

## 2019-07-09 NOTE — Assessment & Plan Note (Signed)
History of hyperlipidemia on statin therapy with lipid profile performed 12/05/2018 revealing a total cholesterol of 160, LDL 68 and HDL 79.

## 2019-07-09 NOTE — Progress Notes (Addendum)
COVID Vaccine Completed:Yes Date COVID Vaccine completed:04/02/19 COVID vaccine manufacturer: Pfizer     At [facility]  PCP - Dr. Glo Herring Cardiologist - Dr. Adora Fridge  Will see him 07/09/19.  Chest x-ray - 07/09/19 EKG - 07/09/19 Stress Test - 2017 ECHO - 11/20/17 Cardiac Cath -  CABG 2013  Sleep Study - no CPAP - no  Fasting Blood Sugar - NA Checks Blood Sugar _____ times a day  Blood Thinner Instructions:ASA Aspirin Instructions:none that the Pt is aware of. She has info at home and will read it. Then Call Md if not addressed. Last Dose:06/16/19  Anesthesia review:   Patient denies shortness of breath, fever, cough and chest pain at PAT appointment yes   Patient verbalized understanding of instructions that were given to them at the PAT appointment. Patient was also instructed that they will need to review over the PAT instructions again at home before surgery. Yes Pt reports that she does have asthma but she was able to walk and workout prior to knee pain and has no SOB with ADLS.  She said that she has urine retention for a week after surgery.

## 2019-07-09 NOTE — Assessment & Plan Note (Signed)
History of CAD status post CABG by Dr. Darcey Nora for left main/three-vessel disease that I found a cath 01/12/2011. She had LIMA to LAD, vein to the obtuse marginal branch and to the right coronary artery. Her last Myoview performed 12/28/2015 was nonischemic. She denies chest pain or shortness of breath.

## 2019-07-09 NOTE — Patient Instructions (Signed)
Medication Instructions:  Your physician recommends that you continue on your current medications as directed. Please refer to the Current Medication list given to you today.  *If you need a refill on your cardiac medications before your next appointment, please call your pharmacy*  Lab Work: NONE   Testing/Procedures: NONE  Follow-Up: At [facility], you and your health needs are our priority.  As part of our continuing mission to provide you with exceptional heart care, we have created designated Provider Care Teams.  These Care Teams include your primary Cardiologist (physician) and Advanced Practice Providers (APPs -  Physician Assistants and Nurse Practitioners) who all work together to provide you with the care you need, when you need it.  We recommend signing up for the patient portal called "MyChart".  Sign up information is provided on this After Visit Summary.  MyChart is used to connect with patients for Virtual Visits (Telemedicine).  Patients are able to view lab/test results, encounter notes, upcoming appointments, etc.  Non-urgent messages can be sent to your provider as well.   To learn more about what you can do with MyChart, go to [email or web address].    Your next appointment:   12 month(s)  You will receive a reminder letter in the mail two months in advance. If you don't receive a letter, please call our office to schedule the follow-up appointment.  The format for your next appointment:   In Person  Provider:   DR Gwenlyn Found   Other Instructions  YOU ARE CLEARED/LOW RISK FOR YOUR UPCOMING KNEE SURGERY

## 2019-07-09 NOTE — Progress Notes (Signed)
07/09/2019 [name] Long   08/12/1940  [id number]  Primary Physician [name] Lung, MD Primary Cardiologist: [name] Harp MD FACP, [place], [place], [place]  HPI:  [name] Long is a 79 y.o.  mildly overweight married Caucasian female, mother of 2, grandmother to 6 grandchildren, who I last saw in the offic 10/04/2018. She ended up being admitted January 12, 2011, with chest pain and was catheterized, revealing a left main 3-vessel disease with an EF of 50% and high anterior hypokinesia, for which she underwent coronary artery bypass grafting by Dr. Tharon Aquas Trigt with a LIMA to her LAD, vein to an obtuse marginal branch and to the right coronary artery. Postop course was uncomplicated. A Myoview in April was normal. Her lipid profile was excellent. She saw [name] Long in our office on July 15 complaining of chest pain, and the concern was musculoskeletal versus pericarditis. A 2D echocardiogram was entirely normal. She ended up seeing Dr. Prescott Long, who diagnosed a staph sternal wound infection, requiring hospitalization and debridement. She had a wound VAC on and fortunately has completely healed.Since I saw her in the office a yearand a half ago she was complaining of some increased dyspnea on exertion and decreased exercise tolerance. A Myoview stress test was normal as was a 2D echocardiogram. It turns out that her increasing shortness of breath is probably related to exacerbation of her asthma at that time.   She has been diagnosed with renal cell carcinoma and is scheduled to undergo laparoscopic partial nephrectomy by Dr. Gilford Long at [facility] urology.  Since I saw her October 2020 she did undergo meniscus repair by Dr. Latanya Long  of her right knee which did not result in an acceptable clinical outcome and therefore she is undergoing right total knee replacement later this month. She denies chest pain or shortness of breath. Her last Myoview stress test performed 12/28/2015 was low risk and  nonischemic.   Current Meds  Medication Sig  . albuterol (PROVENTIL HFA) 108 (90 Base) MCG/ACT inhaler INHALE 2 PUFF AS DIRECTED FOUR TIMES A DAY AS NEEDED (Patient taking differently: Inhale 1 puff into the lungs every 6 (six) hours as needed for wheezing or shortness of breath. )  . aspirin EC 81 MG tablet Take 81 mg by mouth daily. Swallow whole.  [name] Long atorvastatin (LIPITOR) 20 MG tablet TAKE ONE TABLET BY MOUTH DAILY  **MUST CALL MD FOR APPOINTMENT (Patient taking differently: Take 20 mg by mouth at bedtime. )  . BAC 50-325-40 MG tablet Take 1 tablet by mouth every 6 (six) hours as needed for headache.  . Calcium Carbonate (CALTRATE 600 PO) Take 1,200 mg by mouth daily.  . carisoprodol (SOMA) 350 MG tablet Take 350 mg by mouth every 8 (eight) hours as needed (for arthritis pain).   . cholecalciferol (VITAMIN D) 25 MCG (1000 UNIT) tablet Take 1,000 Units by mouth daily.  . Fluticasone-Salmeterol (ADVAIR DISKUS) 500-50 MCG/DOSE AEPB Inhale 1 puff into the lungs 2 (two) times daily.  [name] Long gabapentin (NEURONTIN) 600 MG tablet Take 1,200-1,800 mg by mouth See admin instructions. Take 2 tablets (1200 mg) by mouth in the morning & take 3 tablets (1800 mg) by mouth at night  . HYDROmorphone (DILAUDID) 4 MG tablet Take 1 tablet (4 mg total) by mouth every 4 (four) hours as needed for severe pain.  . magnesium oxide (MAG-OX) 400 MG tablet Take 400 mg by mouth at bedtime.  . metoprolol tartrate (LOPRESSOR) 25 MG tablet TAKE ONE TABLET  BY MOUTH TWICE A DAY (Patient taking differently: Take 25 mg by mouth in the morning and at bedtime. )  . montelukast (SINGULAIR) 10 MG tablet Take 1 tablet (10 mg total) by mouth daily. (Patient taking differently: Take 10 mg by mouth at bedtime. )  . Multiple Vitamins-Minerals (PRESERVISION AREDS 2 PO) Take 1 tablet by mouth in the morning and at bedtime.  . silodosin (RAPAFLO) 8 MG CAPS capsule Take 8 mg by mouth daily.      Allergies  Allergen Reactions  . Dimenhydrinate  Other (See Comments)    "locks up kidneys"  . Morphine And Related Nausea And Vomiting  . Percocet [Oxycodone-Acetaminophen] Other (See Comments)    hallucinations  . Zolpidem Other (See Comments)    Unusual behavior    Social History   Socioeconomic History  . Marital status: Married    Spouse name: Not on file  . Number of children: Not on file  . Years of education: Not on file  . Highest education level: Not on file  Occupational History  . Not on file  Tobacco Use  . Smoking status: Former Smoker    Quit date: 01/12/1980    Years since quitting: 39.5  . Smokeless tobacco: Never Used  Vaping Use  . Vaping Use: Never used  Substance and Sexual Activity  . Alcohol use: No    Alcohol/week: 0.0 standard drinks  . Drug use: No  . Sexual activity: Yes  Other Topics Concern  . Not on file  Social History Narrative  . Not on file   Social Determinants of Health   Financial Resource Strain:   . Difficulty of Paying Living Expenses:   Food Insecurity:   . Worried About Charity fundraiser in the Last Year:   . Arboriculturist in the Last Year:   Transportation Needs:   . Film/video editor (Medical):   [name] Long Lack of Transportation (Non-Medical):   Physical Activity:   . Days of Exercise per Week:   . Minutes of Exercise per Session:   Stress:   . Feeling of Stress :   Social Connections:   . Frequency of Communication with Friends and Family:   . Frequency of Social Gatherings with Friends and Family:   . Attends Religious Services:   . Active Member of Clubs or Organizations:   . Attends Archivist Meetings:   [name] Long Marital Status:   Intimate Partner Violence:   . Fear of Current or Ex-Partner:   . Emotionally Abused:   [name] Long Physically Abused:   . Sexually Abused:      Review of Systems: General: negative for chills, fever, night sweats or weight changes.  Cardiovascular: negative for chest pain, dyspnea on exertion, edema, orthopnea, palpitations,  paroxysmal nocturnal dyspnea or shortness of breath Dermatological: negative for rash Respiratory: negative for cough or wheezing Urologic: negative for hematuria Abdominal: negative for nausea, vomiting, diarrhea, bright red blood per rectum, melena, or hematemesis Neurologic: negative for visual changes, syncope, or dizziness All other systems reviewed and are otherwise negative except as noted above.    Blood pressure 140/83, pulse 92, height 5' 4.5" (1.638 m), weight 175 lb (79.4 kg), SpO2 95 %.  General appearance: alert and no distress Neck: no adenopathy, no carotid bruit, no JVD, supple, symmetrical, trachea midline and thyroid not enlarged, symmetric, no tenderness/mass/nodules Lungs: clear to auscultation bilaterally Heart: regular rate and rhythm, S1, S2 normal, no murmur, click, rub or gallop Extremities: extremities normal, atraumatic,  no cyanosis or edema Pulses: 2+ and symmetric Skin: Skin color, texture, turgor normal. No rashes or lesions Neurologic: Alert and oriented X 3, normal strength and tone. Normal symmetric reflexes. Normal coordination and gait  EKG sinus rhythm at 92 with inferior and anterolateral T wave inversion unchanged from prior EKGs. I personally reviewed this EKG.  ASSESSMENT AND PLAN:   S/P CABG x 3: (lima-lad,svg-om,svg-rca) History of CAD status post CABG by Dr. Darcey Nora for left main/three-vessel disease that I found a cath 01/12/2011. She had LIMA to LAD, vein to the obtuse marginal branch and to the right coronary artery. Her last Myoview performed 12/28/2015 was nonischemic. She denies chest pain or shortness of breath.  Hyperlipidemia History of hyperlipidemia on statin therapy with lipid profile performed 12/05/2018 revealing a total cholesterol of 160, LDL 68 and HDL 79.  Essential hypertension History of essential hypertension blood pressure measured today 140/83. She is on metoprolol.      [name] Harp MD FACP,FACC,FAHA,  [place] 07/09/2019 2:49 PM

## 2019-07-15 NOTE — Progress Notes (Signed)
Anesthesia Chart Review   Case: [id number] Date/Time: 07/22/19 0956   Procedure: RIGHT TOTAL KNEE ARTHROPLASTY (Right Knee)   Anesthesia type: Spinal   Pre-op diagnosis: RIGHT KNEE DEGENERATIVE JOINT DISEASE   Location: [facility]OM 06 / [facility]   Surgeons: Melrose Nakayama, MD      DISCUSSION:78 y.o. former smoker (quit 01/12/80) with h/o GERD, PONV, asthma, HTN, CAD (s/p CABG x3 01/12/2011), right knee DJD scheduled for above procedure 07/22/2019 with Dr. Melrose Nakayama.   Pt last seen by cardiology 07/09/2019.  Stable at this visit.  "History of CAD status post CABG by Dr. Darcey Nora for left main/three-vessel disease that I found a cath 01/12/2011. She had LIMA to LAD, vein to the obtuse marginal branch and to the right coronary artery. Her last Myoview performed 12/28/2015 was nonischemic. She denies chest pain or shortness of breath."  Anticipate pt can proceed with planned procedure barring acute status change.   VS: BP (!) 150/88   Pulse (!) 2   Temp 36.7 C (Oral)   Resp 16   Ht 5' 4.5" (1.638 m)   Wt 79.4 kg   SpO2 95%   BMI 29.57 kg/m   PROVIDERS: Denita Lung, MD is PCP   Quay Burow, MD is Cardiologist  LABS: Labs reviewed: Acceptable for surgery. and forwarded to surgeon (all labs ordered are listed, but only abnormal results are displayed)  Labs Reviewed  BASIC METABOLIC PANEL - Abnormal; Notable for the following components:      Result Value   Glucose, Bld 116 (*)    All other components within normal limits  URINALYSIS, ROUTINE W REFLEX MICROSCOPIC - Abnormal; Notable for the following components:   Leukocytes,Ua MODERATE (*)    Bacteria, UA RARE (*)    All other components within normal limits  SURGICAL PCR SCREEN  APTT  CBC WITH DIFFERENTIAL/PLATELET  PROTIME-INR  TYPE AND SCREEN     IMAGES:   EKG: 07/09/2019 Rate 92 bpm  Normal sinus rhythm Possible left atrial enlargement T wave abnormality, consider inferior ischemia T wave abnormality, consider  anterolateral ischemia   CV: Echo 11/20/17 Study Conclusions   - Left ventricle: The cavity size was normal. There was mild  concentric hypertrophy. Systolic function was normal. The  estimated ejection fraction was in the range of 60% to 65%. Wall  motion was normal; there were no regional wall motion  abnormalities. Features are consistent with a pseudonormal left  ventricular filling pattern, with concomitant abnormal relaxation  and increased filling pressure (grade 2 diastolic dysfunction).  Doppler parameters are consistent with elevated ventricular  end-diastolic filling pressure.  - Aortic root: The aortic root was normal in size.  - Mitral valve: Calcified annulus. Mildly thickened leaflets .  There was mild regurgitation.  - Right ventricle: The cavity size was normal. Wall thickness was  normal. Systolic function was normal.  - Right atrium: The atrium was normal in size.  - Tricuspid valve: There was mild regurgitation.  - Pulmonary arteries: Systolic pressure was mildly increased. PA  peak pressure: 32 mm Hg (S).  - Inferior vena cava: The vessel was normal in size.  - Pericardium, extracardiac: There was no pericardial effusion.  Myocardial Perfusion 12/25/2015  The left ventricular ejection fraction is normal (55-65%).  Nuclear stress EF: 57%.  Blood pressure demonstrated a normal response to exercise.  There was no ST segment deviation noted during stress.  The study is normal.  This is a low risk study.  Past Medical History:  Diagnosis Date  . Abnormal EKG 01/12/2011  . Allergy    RHINITIS  . Arthritis    neck, back knees, hips  . Asthma   . Asthma 01/12/2011  . Back pain, chronic--plans were for cervical disc surgery week of 01/16/11 01/12/2011  . Blood clot in vein 2004   L leg  . Blood transfusion without reported diagnosis 2004  . Chronic low back pain   . Complication of anesthesia    post op bladder retention after  surgery  . Connective tissue disease ([place])   . Coronary artery disease   . Crescendo angina, with EKG changes, new. 01/12/2011  . Dyspnea   . Fibromyalgia   . Fibromyalgia   . GERD (gastroesophageal reflux disease)   . Headache    migraine - several in a yr.   . History of stress test 04/2011   Normal stress test  . Hx of echocardiogram 07/19/2011   EF 50-55%. There is mild annular calcification, there is trace migtral regurgitation, there is trace tricupid regurgitation, pericardium is thick, there is no pericardial effusion.  . Hypertension   . Myocardial infarction [facility]) 2013   triple heart bypass  . Neuromuscular disorder (HCC)    fibromyalgia   . Osteoporosis    OSTEOPENIA  . Personal history of colonic polyp - adenoma 08/05/2013   08/2013 - 7 mm adenoma - consider repeat colonoscopy 2020 (age 61)  . Pneumonia 2018   numerous times, only admitted one time for pneumonia but she has had pneumonia numerous times;   02-19-2018 END OF NOVEMBER 2019  WAS ADMITTED FOR ABOUT  A WEEK DUE TO "DOUBLE PNEUMONIA", REPORTS TODAY FEELING BACK TO HER NORMAL , DENIES SOB, O2 SAT AT 94 ROOM AIR , AFEBRILE, OTHER VSS   . PONV (postoperative nausea and vomiting)   . PPD positive   . Status post coronary artery bypass grafting   . Urinary incontinence    post op, "sleepy bladder", urinar retention     Past Surgical History:  Procedure Laterality Date  . ABDOMINAL HYSTERECTOMY  1981  . APPENDECTOMY  1946  . BIOPSY BREAST Left 03/27/2019   Benign fibroaipose tissue no evidence of malignancy  . BLADDER REPAIR  1985  . BREAST SURGERY  1992   REDUCTION, x3 surgeries overall on breast for augmentation   . CARDIAC CATHETERIZATION  2013  . CARPAL TUNNEL RELEASE Bilateral 1988, 1999  . [place]  . CORONARY ARTERY BYPASS GRAFT  01/14/2011   Procedure: CORONARY ARTERY BYPASS GRAFTING (CABG);  Surgeon: Tharon Aquas Adelene Idler, MD;  Location: [place];  Service: Open Heart Surgery;   Laterality: N/A;  CABG x three, using right greater saphenous vein harvested endoscopically  . EYE SURGERY Bilateral 1985   radiokerototomy  . LEFT HEART CATHETERIZATION WITH CORONARY ANGIOGRAM N/A 01/13/2011   Procedure: LEFT HEART CATHETERIZATION WITH CORONARY ANGIOGRAM;  Surgeon: Troy Sine, MD;  Location: [facility] CATH LAB;  Service: Cardiovascular;  Laterality: N/A;  . PUBOVAGINAL SLING  2002  . ROBOT ASSISTED LAPAROSCOPIC NEPHRECTOMY Right 02/27/2018   Procedure: XI ROBOTIC ASSISTE PARTIAL NEPHRECTOMY;  Surgeon: Ceasar Mons, MD;  Location: [facility];  Service: Urology;  Laterality: Right;  . STERNAL WOUND DEBRIDEMENT  07/27/2011   Procedure: STERNAL WOUND DEBRIDEMENT;  followed by wound vac Surgeon: Ivin Poot, MD;  Location: [place];  Service: Open Heart Surgery;  Laterality: N/A;  . TONSILLECTOMY  1952  . TOTAL HIP ARTHROPLASTY Right 08/22/2016  Procedure: TOTAL HIP ARTHROPLASTY ANTERIOR APPROACH;  Surgeon: Melrose Nakayama, MD;  Location: [place];  Service: Orthopedics;  Laterality: Right;  . TRIGGER FINGER RELEASE Right 2014   4th finger  . TROCHANTERIC BURSA EXCISION Right    bursa removed, R hip  . TUBAL LIGATION      MEDICATIONS: . albuterol (PROVENTIL HFA) 108 (90 Base) MCG/ACT inhaler  . aspirin EC 81 MG tablet  . atorvastatin (LIPITOR) 20 MG tablet  . BAC 50-325-40 MG tablet  . Calcium Carbonate (CALTRATE 600 PO)  . carisoprodol (SOMA) 350 MG tablet  . cholecalciferol (VITAMIN D) 25 MCG (1000 UNIT) tablet  . Fluticasone-Salmeterol (ADVAIR DISKUS) 500-50 MCG/DOSE AEPB  . gabapentin (NEURONTIN) 600 MG tablet  . HYDROmorphone (DILAUDID) 4 MG tablet  . magnesium oxide (MAG-OX) 400 MG tablet  . metoprolol tartrate (LOPRESSOR) 25 MG tablet  . montelukast (SINGULAIR) 10 MG tablet  . Multiple Vitamins-Minerals (PRESERVISION AREDS 2 PO)  . silodosin (RAPAFLO) 8 MG CAPS capsule   No current facility-administered medications for this encounter.    Maia Plan [facility] Pre-Surgical Testing [phone] 07/15/19  3:34 PM

## 2019-07-18 ENCOUNTER — Other Ambulatory Visit (HOSPITAL_COMMUNITY)
Admission: RE | Admit: 2019-07-18 | Discharge: 2019-07-18 | Disposition: A | Discharge status: Home / Self Care | Payer: PPO | Source: Ambulatory Visit | Attending: Orthopaedic Surgery | Admitting: Orthopaedic Surgery

## 2019-07-18 DIAGNOSIS — Z01812 Encounter for preprocedural laboratory examination: Secondary | ICD-10-CM | POA: Diagnosis not present

## 2019-07-18 DIAGNOSIS — Z20822 Contact with and (suspected) exposure to covid-19: Secondary | ICD-10-CM | POA: Diagnosis not present

## 2019-07-18 LAB — SARS CORONAVIRUS 2 (TAT 6-24 HRS): SARS Coronavirus 2: NEGATIVE

## 2019-07-18 NOTE — H&P (Signed)
TOTAL KNEE ADMISSION H&P  Patient is being admitted for right total knee arthroplasty.  Subjective:  Chief Complaint:right knee pain.  HPI: [name] Long, 79 y.o. female, has a history of pain and functional disability in the right knee due to arthritis and has failed non-surgical conservative treatments for greater than 12 weeks to includeNSAID's and/or analgesics, corticosteriod injections, flexibility and strengthening excercises, supervised PT with diminished ADL's post treatment, use of assistive devices, weight reduction as appropriate and activity modification.  Onset of symptoms was gradual, starting 5 years ago with gradually worsening course since that time. The patient noted prior procedures on the knee to include  arthroscopy on the right knee(s).  Patient currently rates pain in the right knee(s) at 10 out of 10 with activity. Patient has night pain, worsening of pain with activity and weight bearing, pain that interferes with activities of daily living, pain with passive range of motion, crepitus and joint swelling.  Patient has evidence of subchondral cysts, subchondral sclerosis, periarticular osteophytes and joint space narrowing by imaging studies. There is no active infection.  Patient Active Problem List   Diagnosis Date Noted  . Renal cell carcinoma ([place]) 03/13/2018  . Vitamin D deficiency 07/23/2017  . Osteopenia 07/23/2017  . Essential hypertension 05/15/2017  . Primary localized osteoarthritis of right hip 08/22/2016  . Primary osteoarthritis of right hip 08/22/2016  . Lumbar radiculopathy, right 03/06/2016  . Personal history of colonic polyp - adenoma 08/05/2013  . GERD (gastroesophageal reflux disease) 03/11/2013  . Rheumatoid arthritis ([place]) 03/11/2013  . High frequency hearing loss 03/11/2013  . Tinnitus 03/11/2013  . Hyperlipidemia 08/21/2012  . S/P CABG x 3: (lima-lad,svg-om,svg-rca) 01/16/2011  . CAD (coronary artery disease): multivessel, good LVF 50% at cath  01/16/2011  . Back pain, chronic--plans were for cervical disc surgery week of 01/16/11 01/12/2011  . Asthma, chronic 01/12/2011  . Fibromyalgia 01/12/2011   Past Medical History:  Diagnosis Date  . Abnormal EKG 01/12/2011  . Allergy    RHINITIS  . Arthritis    neck, back knees, hips  . Asthma   . Asthma 01/12/2011  . Back pain, chronic--plans were for cervical disc surgery week of 01/16/11 01/12/2011  . Blood clot in vein 2004   L leg  . Blood transfusion without reported diagnosis 2004  . Chronic low back pain   . Complication of anesthesia    post op bladder retention after surgery  . Connective tissue disease ([place])   . Coronary artery disease   . Crescendo angina, with EKG changes, new. 01/12/2011  . Dyspnea   . Fibromyalgia   . Fibromyalgia   . GERD (gastroesophageal reflux disease)   . Headache    migraine - several in a yr.   . History of stress test 04/2011   Normal stress test  . Hx of echocardiogram 07/19/2011   EF 50-55%. There is mild annular calcification, there is trace migtral regurgitation, there is trace tricupid regurgitation, pericardium is thick, there is no pericardial effusion.  . Hypertension   . Myocardial infarction [facility]) 2013   triple heart bypass  . Neuromuscular disorder (HCC)    fibromyalgia   . Osteoporosis    OSTEOPENIA  . Personal history of colonic polyp - adenoma 08/05/2013   08/2013 - 7 mm adenoma - consider repeat colonoscopy 2020 (age 43)  . Pneumonia 2018   numerous times, only admitted one time for pneumonia but she has had pneumonia numerous times;   02-19-2018 END OF NOVEMBER 2019  WAS  ADMITTED FOR ABOUT  A WEEK DUE TO "DOUBLE PNEUMONIA", REPORTS TODAY FEELING BACK TO HER NORMAL , DENIES SOB, O2 SAT AT 94 ROOM AIR , AFEBRILE, OTHER VSS   . PONV (postoperative nausea and vomiting)   . PPD positive   . Status post coronary artery bypass grafting   . Urinary incontinence    post op, "sleepy bladder", urinar retention     Past Surgical  History:  Procedure Laterality Date  . ABDOMINAL HYSTERECTOMY  1981  . APPENDECTOMY  1946  . BIOPSY BREAST Left 03/27/2019   Benign fibroaipose tissue no evidence of malignancy  . BLADDER REPAIR  1985  . BREAST SURGERY  1992   REDUCTION, x3 surgeries overall on breast for augmentation   . CARDIAC CATHETERIZATION  2013  . CARPAL TUNNEL RELEASE Bilateral 1988, 1999  . [place]  . CORONARY ARTERY BYPASS GRAFT  01/14/2011   Procedure: CORONARY ARTERY BYPASS GRAFTING (CABG);  Surgeon: Tharon Aquas Adelene Idler, MD;  Location: [place];  Service: Open Heart Surgery;  Laterality: N/A;  CABG x three, using right greater saphenous vein harvested endoscopically  . EYE SURGERY Bilateral 1985   radiokerototomy  . LEFT HEART CATHETERIZATION WITH CORONARY ANGIOGRAM N/A 01/13/2011   Procedure: LEFT HEART CATHETERIZATION WITH CORONARY ANGIOGRAM;  Surgeon: Troy Sine, MD;  Location: [facility];  Service: Cardiovascular;  Laterality: N/A;  . PUBOVAGINAL SLING  2002  . ROBOT ASSISTED LAPAROSCOPIC NEPHRECTOMY Right 02/27/2018   Procedure: XI ROBOTIC ASSISTE PARTIAL NEPHRECTOMY;  Surgeon: Ceasar Mons, MD;  [facility];  Service: Urology;  Laterality: Right;  . STERNAL WOUND DEBRIDEMENT  07/27/2011   Procedure: STERNAL WOUND DEBRIDEMENT;  followed by wound vac Surgeon: Ivin Poot, MD;  Location: [place];  Service: Open Heart Surgery;  Laterality: N/A;  . TONSILLECTOMY  1952  . TOTAL HIP ARTHROPLASTY Right 08/22/2016   Procedure: TOTAL HIP ARTHROPLASTY ANTERIOR APPROACH;  Surgeon: Melrose Nakayama, MD;  Location: [place];  Service: Orthopedics;  Laterality: Right;  . TRIGGER FINGER RELEASE Right 2014   4th finger  . TROCHANTERIC BURSA EXCISION Right    bursa removed, R hip  . TUBAL LIGATION      No current facility-administered medications for this encounter.   Current Outpatient Medications  Medication Sig Dispense Refill Last Dose  . albuterol (PROVENTIL HFA) 108  (90 Base) MCG/ACT inhaler INHALE 2 PUFF AS DIRECTED FOUR TIMES A DAY AS NEEDED (Patient taking differently: Inhale 1 puff into the lungs every 6 (six) hours as needed for wheezing or shortness of breath. ) 6.7 g 1   . aspirin EC 81 MG tablet Take 81 mg by mouth daily. Swallow whole.     Marland Kitchen BAC 50-325-40 MG tablet Take 1 tablet by mouth every 6 (six) hours as needed for headache.     . Calcium Carbonate (CALTRATE 600 PO) Take 1,200 mg by mouth daily.     . carisoprodol (SOMA) 350 MG tablet Take 350 mg by mouth every 8 (eight) hours as needed (for arthritis pain).      . cholecalciferol (VITAMIN D) 25 MCG (1000 UNIT) tablet Take 1,000 Units by mouth daily.     . Fluticasone-Salmeterol (ADVAIR DISKUS) 500-50 MCG/DOSE AEPB Inhale 1 puff into the lungs 2 (two) times daily. 3 each 3   . gabapentin (NEURONTIN) 600 MG tablet Take 1,200-1,800 mg by mouth See admin instructions. Take 2 tablets (1200 mg) by mouth in the morning & take 3 tablets (  1800 mg) by mouth at night     . HYDROmorphone (DILAUDID) 4 MG tablet Take 1 tablet (4 mg total) by mouth every 4 (four) hours as needed for severe pain. 10 tablet 0   . magnesium oxide (MAG-OX) 400 MG tablet Take 400 mg by mouth at bedtime.     . metoprolol tartrate (LOPRESSOR) 25 MG tablet TAKE ONE TABLET BY MOUTH TWICE A DAY (Patient taking differently: Take 25 mg by mouth in the morning and at bedtime. ) 180 tablet 1   . montelukast (SINGULAIR) 10 MG tablet Take 1 tablet (10 mg total) by mouth daily. (Patient taking differently: Take 10 mg by mouth at bedtime. ) 90 tablet 1   . Multiple Vitamins-Minerals (PRESERVISION AREDS 2 PO) Take 1 tablet by mouth in the morning and at bedtime.     . silodosin (RAPAFLO) 8 MG CAPS capsule Take 8 mg by mouth daily.      Marland Kitchen atorvastatin (LIPITOR) 20 MG tablet TAKE ONE TABLET BY MOUTH DAILY  **MUST CALL MD FOR APPOINTMENT (Patient taking differently: Take 20 mg by mouth at bedtime. ) 90 tablet 2    Allergies  Allergen Reactions  .  Dimenhydrinate Other (See Comments)    "locks up kidneys"  . Morphine And Related Nausea And Vomiting  . Percocet [Oxycodone-Acetaminophen] Other (See Comments)    hallucinations  . Zolpidem Other (See Comments)    Unusual behavior    Social History   Tobacco Use  . Smoking status: Former Smoker    Quit date: 01/12/1980    Years since quitting: 39.5  . Smokeless tobacco: Never Used  Substance Use Topics  . Alcohol use: No    Alcohol/week: 0.0 standard drinks    Family History  Adopted: Yes  Problem Relation Age of Onset  . Hypertension Brother      Review of Systems  Musculoskeletal: Positive for arthralgias.       Right knee  All other systems reviewed and are negative.   Objective:  Physical Exam HENT:     Head: Normocephalic.     Mouth/Throat:     Mouth: Mucous membranes are moist.     Pharynx: Oropharynx is clear.  Eyes:     Extraocular Movements: Extraocular movements intact.  Cardiovascular:     Rate and Rhythm: Normal rate and regular rhythm.     Pulses: Normal pulses.  Pulmonary:     Effort: Pulmonary effort is normal.  Abdominal:     Palpations: Abdomen is soft.  Musculoskeletal:     Cervical back: Normal range of motion.     Comments: Right knee motion is about 0-100.  She has medial and posterior pain.  I do not feel an effusion.  Portals are healed and calf is soft and nontender for there is some crepitation.  Hip motion does not cause any knee pain.  Sensation and motor function are intact in her feet with palpable pulses on both sides.    Skin:    General: Skin is warm and dry.  Neurological:     General: No focal deficit present.     Mental Status: She is alert and oriented to person, place, and time. Mental status is at baseline.  Psychiatric:        Mood and Affect: Mood normal.        Behavior: Behavior normal.        Thought Content: Thought content normal.        Judgment: Judgment normal.  Vital signs in last 24 hours:     Labs:   Estimated body mass index is 29.57 kg/m as calculated from the following:   Height as of 07/09/19: 5' 4.5" (1.638 m).   Weight as of 07/09/19: 79.4 kg.   Imaging Review Plain radiographs demonstrate severe degenerative joint disease of the right knee(s). The overall alignment isneutral. The bone quality appears to be good for age and reported activity level.      Assessment/Plan:  End stage primary arthritis, right knee   The patient history, physical examination, clinical judgment of the provider and imaging studies are consistent with end stage degenerative joint disease of the right knee(s) and total knee arthroplasty is deemed medically necessary. The treatment options including medical management, injection therapy arthroscopy and arthroplasty were discussed at length. The risks and benefits of total knee arthroplasty were presented and reviewed. The risks due to aseptic loosening, infection, stiffness, patella tracking problems, thromboembolic complications and other imponderables were discussed. The patient acknowledged the explanation, agreed to proceed with the plan and consent was signed. Patient is being admitted for inpatient treatment for surgery, pain control, PT, OT, prophylactic antibiotics, VTE prophylaxis, progressive ambulation and ADL's and discharge planning. The patient is planning to be discharged home with home health services   Patient's anticipated LOS is less than 2 midnights, meeting these requirements: - Younger than 40 - Lives within 1 hour of care - Has a competent adult at home to recover with post-op recover - NO history of  - Chronic pain requiring opiods  - Diabetes  - Coronary Artery Disease  - Heart failure  - Heart attack  - Stroke  - DVT/VTE  - Cardiac arrhythmia  - Respiratory Failure/COPD  - Renal failure  - Anemia  - Advanced Liver disease

## 2019-07-21 ENCOUNTER — Other Ambulatory Visit: Payer: Self-pay

## 2019-07-21 ENCOUNTER — Encounter: Payer: Self-pay | Admitting: Family Medicine

## 2019-07-21 ENCOUNTER — Telehealth (INDEPENDENT_AMBULATORY_CARE_PROVIDER_SITE_OTHER): Payer: PPO | Admitting: Family Medicine

## 2019-07-21 VITALS — BP 138/79 | Temp 98.0°F | Wt 175.0 lb

## 2019-07-21 DIAGNOSIS — M1710 Unilateral primary osteoarthritis, unspecified knee: Secondary | ICD-10-CM

## 2019-07-21 DIAGNOSIS — M179 Osteoarthritis of knee, unspecified: Secondary | ICD-10-CM

## 2019-07-21 MED ORDER — TRANEXAMIC ACID 1000 MG/10ML IV SOLN
2000.0000 mg | INTRAVENOUS | Status: DC
  Filled 2019-07-21: qty 20

## 2019-07-21 MED ORDER — BUPIVACAINE LIPOSOME 1.3 % IJ SUSP
20.0000 mL | Freq: Once | INTRAMUSCULAR | Status: DC
  Filled 2019-07-21: qty 20

## 2019-07-21 NOTE — Progress Notes (Signed)
   Subjective:    Patient ID: TASHIRA TORRE, female    DOB: 1940-11-25, 79 y.o.   MRN: [id number]  HPI I connected with  Kamrynn Melott Budnick on 07/21/19 by a video enabled telemedicine application and verified that I am speaking with the correct person using two identifiers. I discussed the limitations of evaluation and management by telemedicine. The patient expressed understanding and agreed to proceed.  She was in her home and I was in my office.  No one else was around. She is scheduled for knee replacement surgery tomorrow.  She was seen recently by Dr. Alvester Chou.  That note was reviewed and is very extensive considering her past medical history.  He cleared her for surgery.  She does have a previous history of DVT however since then she has had hip as well as knee surgery and had no difficulty with that.  Presently she has had no chest pain, shortness of breath.    Review of Systems     Objective:   Physical Exam Alert and in no distress otherwise not examined       Assessment & Plan:  Osteoarthritis of knee, unspecified laterality, unspecified osteoarthritis type She is cleared for surgery.  Did recommend standard postoperative protocol for anticoagulation.

## 2019-07-21 NOTE — Progress Notes (Signed)
Pt called with time change for surgery on 07/22/19

## 2019-07-22 ENCOUNTER — Encounter (HOSPITAL_COMMUNITY): Payer: Self-pay | Admitting: Orthopaedic Surgery

## 2019-07-22 ENCOUNTER — Ambulatory Visit (HOSPITAL_COMMUNITY): Payer: PPO | Admitting: Anesthesiology

## 2019-07-22 ENCOUNTER — Other Ambulatory Visit: Payer: Self-pay

## 2019-07-22 ENCOUNTER — Ambulatory Visit (HOSPITAL_COMMUNITY): Payer: PPO | Admitting: Physician Assistant

## 2019-07-22 ENCOUNTER — Observation Stay (HOSPITAL_COMMUNITY)
Admission: RE | Admit: 2019-07-22 | Discharge: 2019-07-23 | Disposition: A | Discharge status: Home / Self Care | Payer: PPO | Attending: Orthopaedic Surgery | Admitting: Orthopaedic Surgery

## 2019-07-22 ENCOUNTER — Encounter (HOSPITAL_COMMUNITY)
Admission: RE | Disposition: A | Discharge status: Home / Self Care | Payer: Self-pay | Source: Home / Self Care | Attending: Orthopaedic Surgery

## 2019-07-22 DIAGNOSIS — I10 Essential (primary) hypertension: Secondary | ICD-10-CM | POA: Diagnosis not present

## 2019-07-22 DIAGNOSIS — I2510 Atherosclerotic heart disease of native coronary artery without angina pectoris: Secondary | ICD-10-CM | POA: Diagnosis not present

## 2019-07-22 DIAGNOSIS — Z7982 Long term (current) use of aspirin: Secondary | ICD-10-CM | POA: Insufficient documentation

## 2019-07-22 DIAGNOSIS — M859 Disorder of bone density and structure, unspecified: Secondary | ICD-10-CM | POA: Insufficient documentation

## 2019-07-22 DIAGNOSIS — Z87891 Personal history of nicotine dependence: Secondary | ICD-10-CM | POA: Insufficient documentation

## 2019-07-22 DIAGNOSIS — K219 Gastro-esophageal reflux disease without esophagitis: Secondary | ICD-10-CM | POA: Diagnosis not present

## 2019-07-22 DIAGNOSIS — Z79899 Other long term (current) drug therapy: Secondary | ICD-10-CM | POA: Diagnosis not present

## 2019-07-22 DIAGNOSIS — J45909 Unspecified asthma, uncomplicated: Secondary | ICD-10-CM | POA: Insufficient documentation

## 2019-07-22 DIAGNOSIS — M1711 Unilateral primary osteoarthritis, right knee: Principal | ICD-10-CM | POA: Diagnosis present

## 2019-07-22 DIAGNOSIS — G8918 Other acute postprocedural pain: Secondary | ICD-10-CM | POA: Diagnosis not present

## 2019-07-22 DIAGNOSIS — M797 Fibromyalgia: Secondary | ICD-10-CM | POA: Diagnosis not present

## 2019-07-22 DIAGNOSIS — Z951 Presence of aortocoronary bypass graft: Secondary | ICD-10-CM | POA: Diagnosis not present

## 2019-07-22 DIAGNOSIS — I119 Hypertensive heart disease without heart failure: Secondary | ICD-10-CM | POA: Insufficient documentation

## 2019-07-22 HISTORY — PX: TOTAL KNEE ARTHROPLASTY: SHX125

## 2019-07-22 SURGERY — ARTHROPLASTY, KNEE, TOTAL
Anesthesia: Monitor Anesthesia Care | Site: Knee | Laterality: Right

## 2019-07-22 MED ORDER — PROMETHAZINE HCL 25 MG/ML IJ SOLN: 6.2500 mg | INTRAMUSCULAR | Status: DC | PRN

## 2019-07-22 MED ORDER — ACETAMINOPHEN 500 MG PO TABS
ORAL_TABLET | ORAL | Status: AC
  Filled 2019-07-22: qty 2

## 2019-07-22 MED ORDER — BISACODYL 5 MG PO TBEC: 5.0000 mg | DELAYED_RELEASE_TABLET | Freq: Every day | ORAL | Status: DC | PRN

## 2019-07-22 MED ORDER — PROPOFOL 10 MG/ML IV BOLUS
INTRAVENOUS | Status: AC
  Filled 2019-07-22: qty 20

## 2019-07-22 MED ORDER — ALUM & MAG HYDROXIDE-SIMETH 200-200-20 MG/5ML PO SUSP: 30.0000 mL | ORAL | Status: DC | PRN

## 2019-07-22 MED ORDER — FENTANYL CITRATE (PF) 100 MCG/2ML IJ SOLN: 50.0000 ug | INTRAMUSCULAR | Status: DC

## 2019-07-22 MED ORDER — HYDROMORPHONE HCL 2 MG PO TABS
2.0000 mg | ORAL_TABLET | ORAL | Status: DC | PRN
  Administered 2019-07-22 – 2019-07-23 (×4): 2 mg via ORAL
  Filled 2019-07-22 (×3): qty 1

## 2019-07-22 MED ORDER — PROPOFOL 10 MG/ML IV BOLUS
INTRAVENOUS | Status: DC | PRN
  Administered 2019-07-22: 30 mg via INTRAVENOUS

## 2019-07-22 MED ORDER — PHENYLEPHRINE HCL-NACL 10-0.9 MG/250ML-% IV SOLN
INTRAVENOUS | Status: DC | PRN
  Administered 2019-07-22: 40 ug/min via INTRAVENOUS

## 2019-07-22 MED ORDER — DIPHENHYDRAMINE HCL 12.5 MG/5ML PO ELIX: 12.5000 mg | ORAL_SOLUTION | ORAL | Status: DC | PRN

## 2019-07-22 MED ORDER — LACTATED RINGERS IV SOLN: INTRAVENOUS | Status: DC

## 2019-07-22 MED ORDER — PROPOFOL 500 MG/50ML IV EMUL
INTRAVENOUS | Status: DC | PRN
  Administered 2019-07-22: 75 ug/kg/min via INTRAVENOUS

## 2019-07-22 MED ORDER — SODIUM CHLORIDE (PF) 0.9 % IJ SOLN
INTRAMUSCULAR | Status: AC
  Filled 2019-07-22: qty 50

## 2019-07-22 MED ORDER — GABAPENTIN 600 MG PO TABS: 1200.0000 mg | ORAL_TABLET | ORAL | Status: DC

## 2019-07-22 MED ORDER — GABAPENTIN 400 MG PO CAPS
1200.0000 mg | ORAL_CAPSULE | Freq: Every day | ORAL | Status: DC
  Administered 2019-07-23: 1200 mg via ORAL
  Filled 2019-07-22 (×2): qty 3

## 2019-07-22 MED ORDER — BUPIVACAINE IN DEXTROSE 0.75-8.25 % IT SOLN
INTRATHECAL | Status: DC | PRN
  Administered 2019-07-22: 1.6 mL via INTRATHECAL

## 2019-07-22 MED ORDER — METOCLOPRAMIDE HCL 5 MG PO TABS: 5.0000 mg | ORAL_TABLET | Freq: Three times a day (TID) | ORAL | Status: DC | PRN

## 2019-07-22 MED ORDER — SODIUM CHLORIDE 0.9% IV SOLUTION
INTRAVENOUS | Status: DC | PRN
  Administered 2019-07-22: 3000 mL

## 2019-07-22 MED ORDER — MENTHOL 3 MG MT LOZG: 1.0000 | LOZENGE | OROMUCOSAL | Status: DC | PRN

## 2019-07-22 MED ORDER — CEFAZOLIN SODIUM-DEXTROSE 2-4 GM/100ML-% IV SOLN
INTRAVENOUS | Status: AC
  Administered 2019-07-22: 2 g via INTRAVENOUS
  Filled 2019-07-22: qty 100

## 2019-07-22 MED ORDER — CEFAZOLIN SODIUM-DEXTROSE 2-4 GM/100ML-% IV SOLN
2.0000 g | INTRAVENOUS | Status: AC
  Administered 2019-07-22: 2 g via INTRAVENOUS

## 2019-07-22 MED ORDER — MONTELUKAST SODIUM 10 MG PO TABS
10.0000 mg | ORAL_TABLET | Freq: Every day | ORAL | Status: DC
  Administered 2019-07-22: 10 mg via ORAL
  Filled 2019-07-22: qty 1

## 2019-07-22 MED ORDER — ACETAMINOPHEN 500 MG PO TABS
1000.0000 mg | ORAL_TABLET | Freq: Once | ORAL | Status: AC
  Administered 2019-07-22: 1000 mg via ORAL
  Filled 2019-07-22: qty 2

## 2019-07-22 MED ORDER — HYDROMORPHONE HCL 1 MG/ML IJ SOLN: 0.5000 mg | INTRAMUSCULAR | Status: DC | PRN

## 2019-07-22 MED ORDER — DEXAMETHASONE SODIUM PHOSPHATE 10 MG/ML IJ SOLN
INTRAMUSCULAR | Status: DC | PRN
  Administered 2019-07-22: 10 mg

## 2019-07-22 MED ORDER — GABAPENTIN 400 MG PO CAPS
1800.0000 mg | ORAL_CAPSULE | Freq: Every day | ORAL | Status: DC
  Administered 2019-07-22: 1800 mg via ORAL
  Filled 2019-07-22: qty 3

## 2019-07-22 MED ORDER — BUTALBITAL-APAP-CAFFEINE 50-325-40 MG PO TABS: 1.0000 | ORAL_TABLET | Freq: Four times a day (QID) | ORAL | Status: DC | PRN

## 2019-07-22 MED ORDER — TRANEXAMIC ACID 1000 MG/10ML IV SOLN
INTRAVENOUS | Status: DC | PRN
  Administered 2019-07-22: 2000 mg via TOPICAL

## 2019-07-22 MED ORDER — HYDROMORPHONE HCL 1 MG/ML IJ SOLN: 0.2500 mg | INTRAMUSCULAR | Status: DC | PRN

## 2019-07-22 MED ORDER — CHLORHEXIDINE GLUCONATE 0.12 % MT SOLN
15.0000 mL | Freq: Once | OROMUCOSAL | Status: AC
  Administered 2019-07-22: 15 mL via OROMUCOSAL

## 2019-07-22 MED ORDER — METOPROLOL TARTRATE 25 MG PO TABS
25.0000 mg | ORAL_TABLET | Freq: Two times a day (BID) | ORAL | Status: DC
  Administered 2019-07-22 – 2019-07-23 (×2): 25 mg via ORAL
  Filled 2019-07-22 (×2): qty 1

## 2019-07-22 MED ORDER — FENTANYL CITRATE (PF) 100 MCG/2ML IJ SOLN
INTRAMUSCULAR | Status: AC
  Administered 2019-07-22: 100 ug via INTRAVENOUS
  Filled 2019-07-22: qty 2

## 2019-07-22 MED ORDER — FLUTICASONE FUROATE-VILANTEROL 200-25 MCG/INH IN AEPB
1.0000 | INHALATION_SPRAY | Freq: Every day | RESPIRATORY_TRACT | Status: DC
  Filled 2019-07-22: qty 28

## 2019-07-22 MED ORDER — HYDROMORPHONE HCL 2 MG PO TABS
ORAL_TABLET | ORAL | Status: AC
  Filled 2019-07-22: qty 1

## 2019-07-22 MED ORDER — LIDOCAINE HCL (CARDIAC) PF 100 MG/5ML IV SOSY
PREFILLED_SYRINGE | INTRAVENOUS | Status: DC | PRN
Start: 2019-07-22 — End: 2019-07-22
  Administered 2019-07-22: 20 mg via INTRAVENOUS

## 2019-07-22 MED ORDER — BUPIVACAINE-EPINEPHRINE 0.5% -1:200000 IJ SOLN
INTRAMUSCULAR | Status: AC
  Filled 2019-07-22: qty 1

## 2019-07-22 MED ORDER — ORAL CARE MOUTH RINSE: 15.0000 mL | Freq: Once | OROMUCOSAL | Status: AC

## 2019-07-22 MED ORDER — ONDANSETRON HCL 4 MG/2ML IJ SOLN
INTRAMUSCULAR | Status: DC | PRN
  Administered 2019-07-22: 4 mg via INTRAVENOUS

## 2019-07-22 MED ORDER — ASPIRIN 81 MG PO CHEW
81.0000 mg | CHEWABLE_TABLET | Freq: Two times a day (BID) | ORAL | Status: DC
  Administered 2019-07-23: 81 mg via ORAL
  Filled 2019-07-22: qty 1

## 2019-07-22 MED ORDER — PROPOFOL 1000 MG/100ML IV EMUL
INTRAVENOUS | Status: AC
  Filled 2019-07-22: qty 100

## 2019-07-22 MED ORDER — METHOCARBAMOL 500 MG PO TABS
500.0000 mg | ORAL_TABLET | Freq: Four times a day (QID) | ORAL | Status: DC | PRN
  Administered 2019-07-22 – 2019-07-23 (×2): 500 mg via ORAL
  Filled 2019-07-22 (×2): qty 1

## 2019-07-22 MED ORDER — PHENOL 1.4 % MT LIQD: 1.0000 | OROMUCOSAL | Status: DC | PRN

## 2019-07-22 MED ORDER — ALBUTEROL SULFATE (2.5 MG/3ML) 0.083% IN NEBU: 3.0000 mL | INHALATION_SOLUTION | Freq: Four times a day (QID) | RESPIRATORY_TRACT | Status: DC | PRN

## 2019-07-22 MED ORDER — ATORVASTATIN CALCIUM 20 MG PO TABS
20.0000 mg | ORAL_TABLET | Freq: Every day | ORAL | Status: DC
  Administered 2019-07-22: 20 mg via ORAL
  Filled 2019-07-22: qty 1

## 2019-07-22 MED ORDER — BUPIVACAINE LIPOSOME 1.3 % IJ SUSP
INTRAMUSCULAR | Status: DC | PRN
  Administered 2019-07-22: 20 mL

## 2019-07-22 MED ORDER — KETOROLAC TROMETHAMINE 15 MG/ML IJ SOLN
7.5000 mg | Freq: Four times a day (QID) | INTRAMUSCULAR | Status: AC
  Administered 2019-07-22 – 2019-07-23 (×4): 7.5 mg via INTRAVENOUS
  Filled 2019-07-22 (×4): qty 1

## 2019-07-22 MED ORDER — TRANEXAMIC ACID-NACL 1000-0.7 MG/100ML-% IV SOLN
1000.0000 mg | INTRAVENOUS | Status: AC
  Administered 2019-07-22: 1000 mg via INTRAVENOUS

## 2019-07-22 MED ORDER — 0.9 % SODIUM CHLORIDE (POUR BTL) OPTIME
TOPICAL | Status: DC | PRN
  Administered 2019-07-22: 1000 mL

## 2019-07-22 MED ORDER — ONDANSETRON HCL 4 MG PO TABS: 4.0000 mg | ORAL_TABLET | Freq: Four times a day (QID) | ORAL | Status: DC | PRN

## 2019-07-22 MED ORDER — ONDANSETRON HCL 4 MG/2ML IJ SOLN: 4.0000 mg | Freq: Four times a day (QID) | INTRAMUSCULAR | Status: DC | PRN

## 2019-07-22 MED ORDER — HYDROMORPHONE HCL 2 MG PO TABS: 4.0000 mg | ORAL_TABLET | Freq: Once | ORAL | Status: DC

## 2019-07-22 MED ORDER — METHOCARBAMOL 500 MG IVPB - SIMPLE MED
INTRAVENOUS | Status: AC
  Administered 2019-07-22: 500 mg via INTRAVENOUS
  Filled 2019-07-22: qty 50

## 2019-07-22 MED ORDER — CARISOPRODOL 350 MG PO TABS: 350.0000 mg | ORAL_TABLET | Freq: Three times a day (TID) | ORAL | Status: DC | PRN

## 2019-07-22 MED ORDER — BUPIVACAINE-EPINEPHRINE 0.5% -1:200000 IJ SOLN
INTRAMUSCULAR | Status: DC | PRN
  Administered 2019-07-22: 20 mL

## 2019-07-22 MED ORDER — METHOCARBAMOL 500 MG IVPB - SIMPLE MED
500.0000 mg | Freq: Four times a day (QID) | INTRAVENOUS | Status: DC | PRN
  Filled 2019-07-22: qty 50

## 2019-07-22 MED ORDER — ACETAMINOPHEN 500 MG PO TABS
1000.0000 mg | ORAL_TABLET | Freq: Four times a day (QID) | ORAL | Status: AC
  Administered 2019-07-22 – 2019-07-23 (×4): 1000 mg via ORAL
  Filled 2019-07-22 (×3): qty 2

## 2019-07-22 MED ORDER — SODIUM CHLORIDE (PF) 0.9 % IJ SOLN
INTRAMUSCULAR | Status: DC | PRN
  Administered 2019-07-22: 30 mL

## 2019-07-22 MED ORDER — CEFAZOLIN SODIUM-DEXTROSE 2-4 GM/100ML-% IV SOLN
2.0000 g | Freq: Four times a day (QID) | INTRAVENOUS | Status: AC
  Administered 2019-07-22: 2 g via INTRAVENOUS
  Filled 2019-07-22: qty 100

## 2019-07-22 MED ORDER — DOCUSATE SODIUM 100 MG PO CAPS
100.0000 mg | ORAL_CAPSULE | Freq: Two times a day (BID) | ORAL | Status: DC
  Administered 2019-07-22 – 2019-07-23 (×2): 100 mg via ORAL
  Filled 2019-07-22 (×2): qty 1

## 2019-07-22 MED ORDER — TAMSULOSIN HCL 0.4 MG PO CAPS
0.4000 mg | ORAL_CAPSULE | Freq: Every day | ORAL | Status: DC
  Administered 2019-07-23: 0.4 mg via ORAL
  Filled 2019-07-22: qty 1

## 2019-07-22 MED ORDER — METOCLOPRAMIDE HCL 5 MG/ML IJ SOLN: 5.0000 mg | Freq: Three times a day (TID) | INTRAMUSCULAR | Status: DC | PRN

## 2019-07-22 MED ORDER — TRANEXAMIC ACID-NACL 1000-0.7 MG/100ML-% IV SOLN: 1000.0000 mg | Freq: Once | INTRAVENOUS | Status: AC

## 2019-07-22 MED ORDER — ROPIVACAINE HCL 5 MG/ML IJ SOLN
INTRAMUSCULAR | Status: DC | PRN
  Administered 2019-07-22: 30 mL via PERINEURAL

## 2019-07-22 MED ORDER — TRANEXAMIC ACID-NACL 1000-0.7 MG/100ML-% IV SOLN
INTRAVENOUS | Status: AC
  Filled 2019-07-22: qty 100

## 2019-07-22 MED ORDER — POVIDONE-IODINE 10 % EX SWAB
2.0000 "application " | Freq: Once | CUTANEOUS | Status: AC
  Administered 2019-07-22: 2 via TOPICAL

## 2019-07-22 MED ORDER — TRANEXAMIC ACID-NACL 1000-0.7 MG/100ML-% IV SOLN
INTRAVENOUS | Status: AC
  Administered 2019-07-22: 1000 mg via INTRAVENOUS
  Filled 2019-07-22: qty 100

## 2019-07-22 MED ORDER — CEFAZOLIN SODIUM-DEXTROSE 2-4 GM/100ML-% IV SOLN
INTRAVENOUS | Status: AC
  Filled 2019-07-22: qty 100

## 2019-07-22 SURGICAL SUPPLY — 50 items
ATTUNE MED DOME PAT 38 KNEE (Knees) ×2 IMPLANT
ATTUNE PS FEM RT SZ 4 CEM KNEE (Femur) ×2 IMPLANT
ATTUNE PSRP INSR SZ4 5 KNEE (Insert) ×2 IMPLANT
BAG DECANTER FOR FLEXI CONT (MISCELLANEOUS) ×2 IMPLANT
BAG ZIPLOCK 12X15 (MISCELLANEOUS) ×2 IMPLANT
BASE TIBIAL ROT PLAT SZ 5 KNEE (Knees) ×1 IMPLANT
BLADE SAGITTAL 25.0X1.19X90 (BLADE) ×2 IMPLANT
BLADE SAW SGTL 11.0X1.19X90.0M (BLADE) ×2 IMPLANT
BNDG ELASTIC 6X5.8 VLCR STR LF (GAUZE/BANDAGES/DRESSINGS) ×2 IMPLANT
BOOTIES KNEE HIGH SLOAN (MISCELLANEOUS) ×2 IMPLANT
BOWL SMART MIX CTS (DISPOSABLE) ×2 IMPLANT
CEMENT HV SMART SET (Cement) ×4 IMPLANT
COVER SURGICAL LIGHT HANDLE (MISCELLANEOUS) ×2 IMPLANT
COVER WAND RF STERILE (DRAPES) ×2 IMPLANT
CUFF TOURN SGL QUICK 34 (TOURNIQUET CUFF) ×2
CUFF TRNQT CYL 34X4.125X (TOURNIQUET CUFF) ×1 IMPLANT
DECANTER SPIKE VIAL GLASS SM (MISCELLANEOUS) ×4 IMPLANT
DRAPE SHEET LG 3/4 BI-LAMINATE (DRAPES) ×2 IMPLANT
DRAPE TOP 10253 STERILE (DRAPES) ×2 IMPLANT
DRAPE U-SHAPE 47X51 STRL (DRAPES) ×2 IMPLANT
DRSG AQUACEL AG ADV 3.5X10 (GAUZE/BANDAGES/DRESSINGS) ×2 IMPLANT
DURAPREP 26ML APPLICATOR (WOUND CARE) ×4 IMPLANT
ELECT REM PT RETURN 15FT ADLT (MISCELLANEOUS) ×2 IMPLANT
GLOVE BIO SURGEON STRL SZ8 (GLOVE) ×4 IMPLANT
GLOVE BIOGEL PI IND STRL 8 (GLOVE) ×2 IMPLANT
GLOVE BIOGEL PI INDICATOR 8 (GLOVE) ×2
GOWN STRL REUS W/TWL XL LVL3 (GOWN DISPOSABLE) ×4 IMPLANT
HANDPIECE INTERPULSE COAX TIP (DISPOSABLE) ×2
HOLDER FOLEY CATH W/STRAP (MISCELLANEOUS) ×2 IMPLANT
HOOD PEEL AWAY FLYTE STAYCOOL (MISCELLANEOUS) ×6 IMPLANT
KIT TURNOVER KIT A (KITS) IMPLANT
MANIFOLD NEPTUNE II (INSTRUMENTS) ×2 IMPLANT
NS IRRIG 1000ML POUR BTL (IV SOLUTION) ×2 IMPLANT
PACK TOTAL KNEE CUSTOM (KITS) ×2 IMPLANT
PAD ARMBOARD 7.5X6 YLW CONV (MISCELLANEOUS) ×2 IMPLANT
PENCIL SMOKE EVACUATOR (MISCELLANEOUS) IMPLANT
PIN DRILL FIX HALF THREAD (BIT) ×2 IMPLANT
PIN STEINMAN FIXATION KNEE (PIN) ×2 IMPLANT
PROTECTOR NERVE ULNAR (MISCELLANEOUS) ×2 IMPLANT
SET HNDPC FAN SPRY TIP SCT (DISPOSABLE) ×1 IMPLANT
SUT ETHIBOND NAB CT1 #1 30IN (SUTURE) ×4 IMPLANT
SUT VIC AB 0 CT1 36 (SUTURE) ×2 IMPLANT
SUT VIC AB 2-0 CT1 27 (SUTURE) ×2
SUT VIC AB 2-0 CT1 TAPERPNT 27 (SUTURE) ×1 IMPLANT
SUT VICRYL AB 3-0 FS1 BRD 27IN (SUTURE) ×2 IMPLANT
TIBIAL BASE ROT PLAT SZ 5 KNEE (Knees) ×2 IMPLANT
TRAY FOLEY MTR SLVR 14FR STAT (SET/KITS/TRAYS/PACK) ×2 IMPLANT
WATER STERILE IRR 1000ML POUR (IV SOLUTION) ×4 IMPLANT
WRAP KNEE MAXI GEL POST OP (GAUZE/BANDAGES/DRESSINGS) ×2 IMPLANT
YANKAUER SUCT BULB TIP NO VENT (SUCTIONS) ×2 IMPLANT

## 2019-07-22 NOTE — Interval H&P Note (Signed)
History and Physical Interval Note:  07/22/2019 10:59 AM  [name] Long  has presented today for surgery, with the diagnosis of RIGHT KNEE DEGENERATIVE JOINT DISEASE.  The various methods of treatment have been discussed with the patient and family. After consideration of risks, benefits and other options for treatment, the patient has consented to  Procedure(s): RIGHT TOTAL KNEE ARTHROPLASTY (Right) as a surgical intervention.  The patient's history has been reviewed, patient examined, no change in status, stable for surgery.  I have reviewed the patient's chart and labs.  Questions were answered to the patient's satisfaction.     Hessie Dibble

## 2019-07-22 NOTE — Anesthesia Preprocedure Evaluation (Addendum)
Anesthesia Evaluation  Patient identified by MRN, date of birth, ID band Patient awake    Reviewed: Allergy & Precautions, NPO status , Patient's Chart, lab work & pertinent test results  History of Anesthesia Complications (+) PONV and history of anesthetic complications (hx postop urinary retention)  Airway Mallampati: II  TM Distance: >3 FB Neck ROM: Full    Dental no notable dental hx. (+) Dental Advisory Given, Teeth Intact   Pulmonary neg shortness of breath, asthma , former smoker,  Inhalers- advair and albuterol (last used 2 days ago), has been fairly uncontrolled recently w/ allergy season    Pulmonary exam normal breath sounds clear to auscultation       Cardiovascular hypertension, Pt. on medications + CAD, + Past MI, + CABG (2013) and +CHF (grade 2 diastolic dysfunction)  Normal cardiovascular exam+ Valvular Problems/Murmurs (mild MR) MR  Rhythm:Regular Rate:Normal  Echo 2019: - Left ventricle: The cavity size was normal. There was mild  concentric hypertrophy. Systolic function was normal. The  estimated ejection fraction was in the range of 60% to 65%. Wall  motion was normal; there were no regional wall motion  abnormalities. Features are consistent with a pseudonormal left  ventricular filling pattern, with concomitant abnormal relaxation  and increased filling pressure (grade 2 diastolic dysfunction).  Doppler parameters are consistent with elevated ventricular  end-diastolic filling pressure.  - Aortic root: The aortic root was normal in size.  - Mitral valve: Calcified annulus. Mildly thickened leaflets .  There was mild regurgitation.  - Right ventricle: The cavity size was normal. Wall thickness was  normal. Systolic function was normal.  - Right atrium: The atrium was normal in size.  - Tricuspid valve: There was mild regurgitation.  - Pulmonary arteries: Systolic pressure was mildly  increased. PA  peak pressure: 32 mm Hg (S).  - Inferior vena cava: The vessel was normal in size.  - Pericardium, extracardiac: There was no pericardial effusion.      Neuro/Psych  Headaches, negative psych ROS   GI/Hepatic Neg liver ROS, GERD  Medicated and Controlled,  Endo/Other  negative endocrine ROS  Renal/GU negative Renal ROS  negative genitourinary   Musculoskeletal  (+) Arthritis , Osteoarthritis,  Fibromyalgia -Chronic LBP    Abdominal   Peds  Hematology negative hematology ROS (+) hct 41.9, plt 156   Anesthesia Other Findings Takes dilaudid occasionally  Reproductive/Obstetrics negative OB ROS                         Anesthesia Physical Anesthesia Plan  ASA: III  Anesthesia Plan: MAC, Regional and Spinal   Post-op Pain Management:  Regional for Post-op pain   Induction: Intravenous  PONV Risk Score and Plan: 3 and Ondansetron, Dexamethasone and Treatment may vary due to age or medical condition  Airway Management Planned: Nasal Cannula and Natural Airway  Additional Equipment: None  Intra-op Plan:   Post-operative Plan:   Informed Consent: I have reviewed the patients History and Physical, chart, labs and discussed the procedure including the risks, benefits and alternatives for the proposed anesthesia with the patient or authorized representative who has indicated his/her understanding and acceptance.     Dental advisory given  Plan Discussed with: CRNA  Anesthesia Plan Comments: (Hx postop urinary retention but also intermittently experiences urinary retention at home and will self-catheterize for this. Explained to patient that this very well may be a problem postoperatively given her prior problems.   Only has PONV with  general anesthesia, will proceed with spinal anesthetic.)      Anesthesia Quick Evaluation

## 2019-07-22 NOTE — Progress Notes (Signed)
AssistedDr. Beth Finucane with right, ultrasound guided, adductor canal block. Side rails up, monitors on throughout procedure. See vital signs in flow sheet. Tolerated Procedure well.  

## 2019-07-22 NOTE — Anesthesia Postprocedure Evaluation (Signed)
Anesthesia Post Note  Patient: Margee H Purdie  Procedure(s) Performed: RIGHT TOTAL KNEE ARTHROPLASTY (Right Knee)     Patient location during evaluation: PACU Anesthesia Type: Regional, MAC and Spinal Level of consciousness: oriented and awake and alert Pain management: pain level controlled Vital Signs Assessment: post-procedure vital signs reviewed and stable Respiratory status: spontaneous breathing, respiratory function stable and patient connected to nasal cannula oxygen Cardiovascular status: blood pressure returned to baseline and stable Postop Assessment: no headache, no backache, no apparent nausea or vomiting and spinal receding Anesthetic complications: no   No complications documented.  Last Vitals:  Vitals:   07/22/19 1025 07/22/19 1321  BP: 128/64 109/66  Pulse: 80 69  Resp: 10 13  Temp:  36.6 C  SpO2: 96% 100%    Last Pain:  Vitals:   07/22/19 0810  TempSrc: Oral    LLE Motor Response: Purposeful movement (07/22/19 1321)   RLE Motor Response: Purposeful movement (07/22/19 1321)   L Sensory Level: L5-Outer lower leg, top of foot, great toe (07/22/19 1321) R Sensory Level: L5-Outer lower leg, top of foot, great toe (07/22/19 1321)  Pervis Hocking

## 2019-07-22 NOTE — Anesthesia Procedure Notes (Signed)
Anesthesia Regional Block: Adductor canal block   Pre-Anesthetic Checklist: ,, timeout performed, Correct Patient, Correct Site, Correct Laterality, Correct Procedure, Correct Position, site marked, Risks and benefits discussed,  Surgical consent,  Pre-op evaluation,  At surgeon's request and post-op pain management  Laterality: Right  Prep: Maximum Sterile Barrier Precautions used, chloraprep       Needles:  Injection technique: Single-shot  Needle Type: Echogenic Stimulator Needle     Needle Length: 9cm  Needle Gauge: 22     Additional Needles:   Procedures:,,,, ultrasound used (permanent image in chart),,,,  Narrative:  Start time: 07/22/2019 10:00 AM End time: 07/22/2019 10:08 AM Injection made incrementally with aspirations every 5 mL.  Performed by: Personally  Anesthesiologist: Pervis Hocking, DO  Additional Notes: Monitors applied. No increased pain on injection. No increased resistance to injection. Injection made in 5cc increments. Good needle visualization. Patient tolerated procedure well.

## 2019-07-22 NOTE — Op Note (Signed)
PREOP DIAGNOSIS: DJD RIGHT KNEE POSTOP DIAGNOSIS: same PROCEDURE: RIGHT TKR ANESTHESIA: Spinal and MAC ATTENDING SURGEON: Hessie Dibble ASSISTANT: Loni Dolly PA  INDICATIONS FOR PROCEDURE: [name] Long is a 79 y.o. female who has struggled for a long time with pain due to degenerative arthritis of the right knee.  The patient has failed many conservative non-operative measures and at this point has pain which limits the ability to sleep and walk.  The patient is offered total knee replacement.  Informed operative consent was obtained after discussion of possible risks of anesthesia, infection, neurovascular injury, DVT, and death.  The importance of the post-operative rehabilitation protocol to optimize result was stressed extensively with the patient.  SUMMARY OF FINDINGS AND PROCEDURE:  [name] Long was taken to the operative suite where under the above anesthesia a right knee replacement was performed.  There were advanced degenerative changes and the bone quality was good.  We used the DePuy Attune system and placed size 4 femur, 5 tibia, 38 mm all polyethylene patella, and a size 5 mm spacer.  Loni Dolly PA-C assisted throughout and was invaluable to the completion of the case in that he helped retract and maintain exposure while I placed components.  He also helped close thereby minimizing OR time.  The patient was admitted for appropriate post-op care to include perioperative antibiotics and mechanical and pharmacologic measures for DVT prophylaxis.  DESCRIPTION OF PROCEDURE:  [name] Long was taken to the operative suite where the above anesthesia was applied.  The patient was positioned supine and prepped and draped in normal sterile fashion.  An appropriate time out was performed.  After the administration of kefzol pre-op antibiotic the leg was elevated and exsanguinated and a tourniquet inflated. A standard longitudinal incision was made on the anterior knee.  Dissection was carried down  to the extensor mechanism.  All appropriate anti-infective measures were used including the pre-operative antibiotic, betadine impregnated drape, and closed hooded exhaust systems for each member of the surgical team.  A medial parapatellar incision was made in the extensor mechanism and the knee cap flipped and the knee flexed.  Some residual meniscal tissues were removed along with any remaining ACL/PCL tissue.  A guide was placed on the tibia and a flat cut was made on it's superior surface.  An intramedullary guide was placed in the femur and was utilized to make anterior and posterior cuts creating an appropriate flexion gap.  A second intramedullary guide was placed in the femur to make a distal cut properly balancing the knee with an extension gap equal to the flexion gap.  The three bones sized to the above mentioned sizes and the appropriate guides were placed and utilized.  A trial reduction was done and the knee easily came to full extension and the patella tracked well on flexion.  The trial components were removed and all bones were cleaned with pulsatile lavage and then dried thoroughly.  Cement was mixed and was pressurized onto the bones followed by placement of the aforementioned components.  Excess cement was trimmed and pressure was held on the components until the cement had hardened.  The tourniquet was deflated and a small amount of bleeding was controlled with cautery and pressure.  The knee was irrigated thoroughly.  The extensor mechanism was re-approximated with #1 ethibond in interrupted fashion.  The knee was flexed and the repair was solid.  The subcutaneous tissues were re-approximated with #0 and #2-0 vicryl and the skin closed with  a subcuticular stitch and steristrips.  A sterile dressing was applied.  Intraoperative fluids, EBL, and tourniquet time can be obtained from anesthesia records.  DISPOSITION:  The patient was taken to recovery room in stable condition and admitted for  appropriate post-op care to include peri-operative antibiotic and DVT prophylaxis with mechanical and pharmacologic measures.  Hessie Dibble 07/22/2019, 12:50 PM

## 2019-07-22 NOTE — Transfer of Care (Signed)
Immediate Anesthesia Transfer of Care Note  [name]  Procedure(s) Performed: Procedure(s): RIGHT TOTAL KNEE ARTHROPLASTY (Right)  Patient Location: PACU  Anesthesia Type:Spinal  Level of Consciousness:  sedated, patient cooperative and responds to stimulation  Airway & Oxygen Therapy:Patient Spontanous Breathing and Patient connected to face mask oxgen  Post-op Assessment:  Report given to PACU RN and Post -op Vital signs reviewed and stable  Post vital signs:  Reviewed and stable  Last Vitals:  Vitals:   07/22/19 1020 07/22/19 1025  BP:  128/64  Pulse: 80 80  Resp: 12 10  Temp:    SpO2: 07% 61%    Complications: No apparent anesthesia complications

## 2019-07-22 NOTE — Anesthesia Procedure Notes (Addendum)
Spinal  Patient location during procedure: OR Start time: 07/22/2019 11:09 AM End time: 07/22/2019 11:13 AM Reason for block: at surgeon's request Staffing Performed: resident/CRNA  Resident/CRNA: Anne Fu, CRNA Preanesthetic Checklist Completed: patient identified, IV checked, site marked, risks and benefits discussed, surgical consent, monitors and equipment checked, pre-op evaluation and timeout performed Spinal Block Patient position: sitting Prep: DuraPrep Patient monitoring: heart rate, continuous pulse ox and blood pressure Approach: midline Location: L2-3 Injection technique: single-shot Needle Needle type: Pencan  Needle gauge: 24 G Needle length: 9 cm Assessment Sensory level: T6 Additional Notes  Functioning IV was confirmed and monitors were applied. Expiration date of kit checked and confirmed. Sterile prep and drape, including hand hygiene and sterile gloves were used. The patient was positioned and the spine was prepped. The skin was anesthetized with lidocaine.  Free flow of clear CSF was obtained prior to injecting local anesthetic into the CSF X 1 attempt.  The spinal needle aspirated freely following injection.  The needle was carefully withdrawn. Patient tolerated procedure well, without complications. Loss of motor and sensory on exam post injection.

## 2019-07-23 ENCOUNTER — Encounter (HOSPITAL_COMMUNITY): Payer: Self-pay | Admitting: Orthopaedic Surgery

## 2019-07-23 DIAGNOSIS — M1711 Unilateral primary osteoarthritis, right knee: Secondary | ICD-10-CM | POA: Diagnosis not present

## 2019-07-23 MED ORDER — ASPIRIN EC 81 MG PO TBEC: 81.0000 mg | DELAYED_RELEASE_TABLET | Freq: Two times a day (BID) | ORAL | 11 refills | Status: DC

## 2019-07-23 MED ORDER — TIZANIDINE HCL 4 MG PO TABS
4.0000 mg | ORAL_TABLET | Freq: Four times a day (QID) | ORAL | 1 refills | Status: DC | PRN
Start: 2019-07-23 — End: 2019-10-13

## 2019-07-23 NOTE — Progress Notes (Signed)
Physical Therapy Treatment Patient Details Name: [name] Long MRN: [id number] DOB: Jun 26, 1940 Today's Date: 07/23/2019    History of Present Illness Pt s/p R TKR and with hx of MI, CABG, CAD, R THR and Fibromyalgia    PT Comments    Pt progressing well with mobility and spouse present to participate in stair training.  Pt eager for dc home.   Follow Up Recommendations  Home health PT;Follow surgeon's recommendation for DC plan and follow-up therapies     Equipment Recommendations  None recommended by PT    Recommendations for Other Services       Precautions / Restrictions Precautions Precautions: Fall;Knee Restrictions Weight Bearing Restrictions: No Other Position/Activity Restrictions: WBAT    Mobility  Bed Mobility Overal bed mobility: Needs Assistance Bed Mobility: Supine to Sit     Supine to sit: Supervision     General bed mobility comments: Pt up in chair and requests back to same  Transfers Overall transfer level: Needs assistance Equipment used: Rolling walker (2 wheeled) Transfers: Sit to/from Stand Sit to Stand: Min guard;Supervision         General transfer comment: cues for LE management and use of UEs to self assist  Ambulation/Gait Ambulation/Gait assistance: Min guard;Supervision Gait Distance (Feet): 100 Feet Assistive device: Rolling walker (2 wheeled) Gait Pattern/deviations: Step-to pattern;Step-through pattern;Decreased step length - right;Decreased step length - left;Shuffle;Trunk flexed     General Gait Details: cues for sequence, posture, position from RW, stride length, and to slow pace for safety   Stairs Stairs: Yes Stairs assistance: Min assist Stair Management: No rails;Step to pattern;Backwards;With walker Number of Stairs: 4 General stair comments: 2 steps twice with RW bkwd - spouse assisting on second attempt; cues for sequence and foot/RW placement   Wheelchair Mobility    Modified Rankin (Stroke Patients  Only)       Balance Overall balance assessment: Mild deficits observed, not formally tested                                          Cognition Arousal/Alertness: Awake/alert Behavior During Therapy: WFL for tasks assessed/performed Overall Cognitive Status: Within Functional Limits for tasks assessed                                        Exercises Total Joint Exercises Ankle Circles/Pumps: AROM;Both;15 reps;Supine Quad Sets: AROM;Both;10 reps;Supine Heel Slides: AAROM;Right;15 reps;Supine Straight Leg Raises: AAROM;AROM;Right;10 reps;Supine    General Comments        Pertinent Vitals/Pain Pain Assessment: 0-10 Pain Score: 6  Pain Location: R knee Pain Descriptors / Indicators: Aching;Sore Pain Intervention(s): Limited activity within patient's tolerance;Monitored during session;Premedicated before session;Ice applied    Home Living Family/patient expects to be discharged to:: Private residence Living Arrangements: Spouse/significant other Available Help at Discharge: Family Type of Home: House Home Access: Stairs to enter Entrance Stairs-Rails: None Home Layout: Two level;Able to live on main level with bedroom/bathroom Home Equipment: Gilford Rile - 2 wheels;Bedside commode;Shower seat - built in      Prior Function Level of Independence: Independent          PT Goals (current goals can now be found in the care plan section) Acute Rehab PT Goals Patient Stated Goal: Get back to gardening PT Goal Formulation: With patient Time For Goal  Achievement: 07/30/19 Potential to Achieve Goals: Good Progress towards PT goals: Progressing toward goals    Frequency    7X/week      PT Plan Current plan remains appropriate    Co-evaluation              AM-PAC PT "6 Clicks" Mobility   Outcome Measure  Help needed turning from your back to your side while in a flat bed without using bedrails?: A Little Help needed moving from  lying on your back to sitting on the side of a flat bed without using bedrails?: A Little Help needed moving to and from a bed to a chair (including a wheelchair)?: A Little Help needed standing up from a chair using your arms (e.g., wheelchair or bedside chair)?: A Little Help needed to walk in hospital room?: A Little Help needed climbing 3-5 steps with a railing? : A Little 6 Click Score: 18    End of Session Equipment Utilized During Treatment: Gait belt Activity Tolerance: Patient tolerated treatment well Patient left: in chair;with call bell/phone within reach Nurse Communication: Mobility status PT Visit Diagnosis: Difficulty in walking, not elsewhere classified (R26.2)     Time: 7106-2694 PT Time Calculation (min) (ACUTE ONLY): 22 min  Charges:  $Gait Training: 8-22 mins $Therapeutic Exercise: 8-22 mins                     Debe Coder PT Acute Rehabilitation Services Pager [phone] Office [phone]    [name] Long 07/23/2019, 12:47 PM

## 2019-07-23 NOTE — Progress Notes (Signed)
Subjective: 1 Day Post-Op Procedure(s) (LRB): RIGHT TOTAL KNEE ARTHROPLASTY (Right)   Patient is feeling well this morning and is looking forward to going home.   Activity level:  wbat Diet tolerance:  ok Voiding:  ok Patient reports pain as mild.    Objective: Vital signs in last 24 hours: Temp:  [97.7 F (36.5 C)-98.5 F (36.9 C)] 98.5 F (36.9 C) (07/21 0513) Pulse Rate:  [58-88] 66 (07/21 0513) Resp:  [7-21] 15 (07/21 0513) BP: (109-174)/(62-104) 113/63 (07/21 0513) SpO2:  [93 %-100 %] 97 % (07/21 0513) Weight:  [79.4 kg] 79.4 kg (07/20 0828)  Labs: No results for input(s): HGB in the last 72 hours. No results for input(s): WBC, RBC, HCT, PLT in the last 72 hours. No results for input(s): NA, K, CL, CO2, BUN, CREATININE, GLUCOSE, CALCIUM in the last 72 hours. No results for input(s): LABPT, INR in the last 72 hours.  Physical Exam:  Neurologically intact ABD soft Neurovascular intact Sensation intact distally Intact pulses distally Dorsiflexion/Plantar flexion intact Incision: dressing C/D/I No cellulitis present  Assessment/Plan:  1 Day Post-Op Procedure(s) (LRB): RIGHT TOTAL KNEE ARTHROPLASTY (Right) Advance diet Up with therapy D/C IV fluids Discharge home with home health after PT today. Follow up in office 2 weeks post op. Continue on 81mg  asa BID x 2 weeks post op.    [name] Long 07/23/2019, 7:57 AM

## 2019-07-23 NOTE — Discharge Summary (Signed)
Patient ID: [name] Long MRN: [id number] DOB/AGE: 1940/05/28 79 y.o.  Admit date: 07/22/2019 Discharge date: 07/23/2019  Admission Diagnoses:  Principal Problem:   Primary osteoarthritis of right knee   Discharge Diagnoses:  Same  Past Medical History:  Diagnosis Date  . Abnormal EKG 01/12/2011  . Allergy    RHINITIS  . Arthritis    neck, back knees, hips  . Asthma   . Asthma 01/12/2011  . Back pain, chronic--plans were for cervical disc surgery week of 01/16/11 01/12/2011  . Blood clot in vein 2004   L leg  . Blood transfusion without reported diagnosis 2004  . Chronic low back pain   . Complication of anesthesia    post op bladder retention after surgery  . Connective tissue disease ([place])   . Coronary artery disease   . Crescendo angina, with EKG changes, new. 01/12/2011  . Dyspnea   . Fibromyalgia   . Fibromyalgia   . GERD (gastroesophageal reflux disease)   . Headache    migraine - several in a yr.   . History of stress test 04/2011   Normal stress test  . Hx of echocardiogram 07/19/2011   EF 50-55%. There is mild annular calcification, there is trace migtral regurgitation, there is trace tricupid regurgitation, pericardium is thick, there is no pericardial effusion.  . Hypertension   . Myocardial infarction [facility]) 2013   triple heart bypass  . Neuromuscular disorder (HCC)    fibromyalgia   . Osteoporosis    OSTEOPENIA  . Personal history of colonic polyp - adenoma 08/05/2013   08/2013 - 7 mm adenoma - consider repeat colonoscopy 2020 (age 79)  . Pneumonia 2018   numerous times, only admitted one time for pneumonia but she has had pneumonia numerous times;   02-19-2018 END OF NOVEMBER 2019  WAS ADMITTED FOR ABOUT  A WEEK DUE TO "DOUBLE PNEUMONIA", REPORTS TODAY FEELING BACK TO HER NORMAL , DENIES SOB, O2 SAT AT 94 ROOM AIR , AFEBRILE, OTHER VSS   . PONV (postoperative nausea and vomiting)   . PPD positive   . Status post coronary artery bypass grafting   . Urinary  incontinence    post op, "sleepy bladder", urinar retention     Surgeries: Procedure(s): RIGHT TOTAL KNEE ARTHROPLASTY on 07/22/2019   Consultants:   Discharged Condition: Improved  Hospital Course: [name] Long is an 79 y.o. female who was admitted 07/22/2019 for operative treatment ofPrimary osteoarthritis of right knee. Patient has severe unremitting pain that affects sleep, daily activities, and work/hobbies. After pre-op clearance the patient was taken to the operating room on 07/22/2019 and underwent  Procedure(s): RIGHT TOTAL KNEE ARTHROPLASTY.    Patient was given perioperative antibiotics:  Anti-infectives (From admission, onward)   Start     Dose/Rate Route Frequency Ordered Stop   07/22/19 1730  ceFAZolin (ANCEF) IVPB 2g/100 mL premix        2 g 200 mL/hr over 30 Minutes Intravenous Every 6 hours 07/22/19 1547 07/22/19 2313   07/22/19 0815  ceFAZolin (ANCEF) IVPB 2g/100 mL premix        2 g 200 mL/hr over 30 Minutes Intravenous On call to O.R. 07/22/19 0800 07/22/19 1115   07/22/19 0805  ceFAZolin (ANCEF) 2-4 GM/100ML-% IVPB       Note to Pharmacy: [name] Long  : cabinet override      07/22/19 0805 07/22/19 1132       Patient was given sequential compression devices, early ambulation, and chemoprophylaxis to prevent  DVT.  Patient benefited maximally from hospital stay and there were no complications.    Recent vital signs:  Patient Vitals for the past 24 hrs:  BP Temp Temp src Pulse Resp SpO2 Height Weight  07/23/19 0513 113/63 98.5 F (36.9 C) Oral 66 15 97 % -- --  07/23/19 0113 119/62 98 F (36.7 C) Oral 63 16 98 % -- --  07/22/19 2102 134/66 97.7 F (36.5 C) Oral 69 18 97 % -- --  07/22/19 2037 128/71 98.1 F (36.7 C) Oral 65 17 97 % -- --  07/22/19 1923 131/76 98.3 F (36.8 C) Oral 64 17 97 % -- --  07/22/19 1802 (!) 153/80 98 F (36.7 C) Oral 64 14 97 % -- --  07/22/19 1700 (!) 148/69 -- -- 71 14 100 % -- --  07/22/19 1622 -- -- -- 67 13 100 % --  --  07/22/19 1600 131/73 -- -- 74 13 99 % -- --  07/22/19 1555 -- -- -- 68 13 98 % -- --  07/22/19 1503 133/74 -- -- -- 20 -- -- --  07/22/19 1500 (!) 174/104 -- -- 69 14 99 % -- --  07/22/19 1415 112/75 -- -- 68 17 97 % -- --  07/22/19 1400 129/70 97.7 F (36.5 C) -- 77 13 99 % -- --  07/22/19 1345 123/79 -- -- 78 15 96 % -- --  07/22/19 1330 117/67 -- -- 66 16 100 % -- --  07/22/19 1321 109/66 97.8 F (36.6 C) -- 69 13 100 % -- --  07/22/19 1025 128/64 -- -- 80 10 96 % -- --  07/22/19 1020 -- -- -- 80 12 96 % -- --  07/22/19 1015 -- -- -- 63 (!) 8 95 % -- --  07/22/19 1010 (!) 120/93 -- -- 68 (!) 7 94 % -- --  07/22/19 1009 -- -- -- 69 11 96 % -- --  07/22/19 1008 -- -- -- 70 13 97 % -- --  07/22/19 1007 -- -- -- 66 13 97 % -- --  07/22/19 1006 126/69 -- -- 63 13 97 % -- --  07/22/19 1005 -- -- -- 66 18 97 % -- --  07/22/19 1004 -- -- -- 67 19 95 % -- --  07/22/19 1003 -- -- -- 70 16 94 % -- --  07/22/19 1002 -- -- -- 60 13 93 % -- --  07/22/19 1001 -- -- -- (!) 58 15 93 % -- --  07/22/19 1000 129/71 -- -- (!) 59 14 93 % -- --  07/22/19 0959 -- -- -- 60 16 93 % -- --  07/22/19 0958 -- -- -- 63 15 93 % -- --  07/22/19 0957 -- -- -- 79 20 93 % -- --  07/22/19 0956 132/85 -- -- 73 16 93 % -- --  07/22/19 0955 -- -- -- 77 (!) 21 93 % -- --  07/22/19 0954 -- -- -- 77 15 94 % -- --  07/22/19 0953 -- -- -- 74 16 93 % -- --  07/22/19 0952 -- -- -- 82 14 93 % -- --  07/22/19 0951 -- -- -- 77 14 93 % -- --  07/22/19 0950 (!) 130/91 -- -- 77 18 93 % -- --  07/22/19 0949 -- -- -- -- 18 -- -- --  07/22/19 0828 -- -- -- -- -- -- 5' 4.5" (1.638 m) 79.4 kg  07/22/19 0810 (!) 144/86 98.1 F (36.7 C) Oral 88  16 98 % -- --     Recent laboratory studies: No results for input(s): WBC, HGB, HCT, PLT, NA, K, CL, CO2, BUN, CREATININE, GLUCOSE, INR, CALCIUM in the last 72 hours.  Invalid input(s): PT, 2   Discharge Medications:   Allergies as of 07/23/2019      Reactions   Dimenhydrinate  Other (See Comments)   "locks up kidneys"   Morphine And Related Nausea And Vomiting   Percocet [oxycodone-acetaminophen] Other (See Comments)   hallucinations   Zolpidem Other (See Comments)   Unusual behavior      Medication List    TAKE these medications   albuterol 108 (90 Base) MCG/ACT inhaler Commonly known as: Proventil HFA INHALE 2 PUFF AS DIRECTED FOUR TIMES A DAY AS NEEDED What changed:   how much to take  how to take this  when to take this  reasons to take this  additional instructions   aspirin EC 81 MG tablet Take 1 tablet (81 mg total) by mouth 2 (two) times daily. Swallow whole. What changed: when to take this   atorvastatin 20 MG tablet Commonly known as: LIPITOR TAKE ONE TABLET BY MOUTH DAILY  **MUST CALL MD FOR APPOINTMENT What changed: See the new instructions.   Bac 50-325-40 MG tablet Generic drug: butalbital-acetaminophen-caffeine Take 1 tablet by mouth every 6 (six) hours as needed for headache.   CALTRATE 600 PO Take 1,200 mg by mouth daily.   carisoprodol 350 MG tablet Commonly known as: SOMA Take 350 mg by mouth every 8 (eight) hours as needed (for arthritis pain).   cholecalciferol 25 MCG (1000 UNIT) tablet Commonly known as: VITAMIN D Take 1,000 Units by mouth daily.   Fluticasone-Salmeterol 500-50 MCG/DOSE Aepb Commonly known as: Advair Diskus Inhale 1 puff into the lungs 2 (two) times daily.   gabapentin 600 MG tablet Commonly known as: NEURONTIN Take 1,200-1,800 mg by mouth See admin instructions. Take 2 tablets (1200 mg) by mouth in the morning & take 3 tablets (1800 mg) by mouth at night   HYDROmorphone 4 MG tablet Commonly known as: DILAUDID Take 1 tablet (4 mg total) by mouth every 4 (four) hours as needed for severe pain.   magnesium oxide 400 MG tablet Commonly known as: MAG-OX Take 400 mg by mouth at bedtime.   metoprolol tartrate 25 MG tablet Commonly known as: LOPRESSOR TAKE ONE TABLET BY MOUTH TWICE A  DAY What changed: when to take this   montelukast 10 MG tablet Commonly known as: SINGULAIR Take 1 tablet (10 mg total) by mouth daily. What changed: when to take this   PRESERVISION AREDS 2 PO Take 1 tablet by mouth in the morning and at bedtime.   silodosin 8 MG Caps capsule Commonly known as: RAPAFLO Take 8 mg by mouth daily.   tiZANidine 4 MG tablet Commonly known as: Zanaflex Take 1 tablet (4 mg total) by mouth every 6 (six) hours as needed for muscle spasms.            Durable Medical Equipment  (From admission, onward)         Start     Ordered   07/22/19 1803  DME Walker rolling  Once       Question:  Patient needs a walker to treat with the following condition  Answer:  Primary osteoarthritis of right knee   07/22/19 1802   07/22/19 1803  DME 3 n 1  Once        07/22/19 1802   07/22/19  1803  DME Bedside commode  Once       Question:  Patient needs a bedside commode to treat with the following condition  Answer:  Primary osteoarthritis of right knee   07/22/19 1802          Diagnostic Studies: DG Chest 2 View  Result Date: 07/09/2019 CLINICAL DATA:  Preoperative respiratory evaluation prior to total knee arthroplasty. Nonsmoker. Prior CABG. EXAM: CHEST - 2 VIEW COMPARISON:  12/05/2018 and earlier. FINDINGS: Sternotomy for CABG. Cardiac silhouette normal in size, unchanged. Thoracic aorta minimally atherosclerotic. Hilar and mediastinal contours otherwise unremarkable. Lungs clear. Bronchovascular markings normal. Pulmonary vascularity normal. No visible pleural effusions. No pneumothorax. Degenerative changes involving the thoracic spine. No interval change. IMPRESSION: No acute cardiopulmonary disease. Stable examination. Electronically Signed   By: Evangeline Dakin M.D.   On: 07/09/2019 15:44    Disposition: Discharge disposition: 01-Home or Self Care       Discharge Instructions    Call MD / Call 911   Complete by: As directed    If you experience  chest pain or shortness of breath, CALL 911 and be transported to the hospital emergency room.  If you develope a fever above 101 F, pus (white drainage) or increased drainage or redness at the wound, or calf pain, call your surgeon's office.   Constipation Prevention   Complete by: As directed    Drink plenty of fluids.  Prune juice may be helpful.  You may use a stool softener, such as Colace (over the counter) 100 mg twice a day.  Use MiraLax (over the counter) for constipation as needed.   Diet - low sodium heart healthy   Complete by: As directed    Discharge instructions   Complete by: As directed    INSTRUCTIONS AFTER JOINT REPLACEMENT   Remove items at home which could result in a fall. This includes throw rugs or furniture in walking pathways ICE to the affected joint every three hours while awake for 30 minutes at a time, for at least the first 3-5 days, and then as needed for pain and swelling.  Continue to use ice for pain and swelling. You may notice swelling that will progress down to the foot and ankle.  This is normal after surgery.  Elevate your leg when you are not up walking on it.   Continue to use the breathing machine you got in the hospital (incentive spirometer) which will help keep your temperature down.  It is common for your temperature to cycle up and down following surgery, especially at night when you are not up moving around and exerting yourself.  The breathing machine keeps your lungs expanded and your temperature down.   DIET:  As you were doing prior to hospitalization, we recommend a well-balanced diet.  DRESSING / WOUND CARE / SHOWERING  You may shower 3 days after surgery, but keep the wounds dry during showering.  You may use an occlusive plastic wrap (Press'n Seal for example), NO SOAKING/SUBMERGING IN THE BATHTUB.  If the bandage gets wet, change with a clean dry gauze.  If the incision gets wet, pat the wound dry with a clean towel.  ACTIVITY  Increase  activity slowly as tolerated, but follow the weight bearing instructions below.   No driving for 6 weeks or until further direction given by your physician.  You cannot drive while taking narcotics.  No lifting or carrying greater than 10 lbs. until further directed by your surgeon. Avoid periods of  inactivity such as sitting longer than an hour when not asleep. This helps prevent blood clots.  You may return to work once you are authorized by your doctor.     WEIGHT BEARING   Weight bearing as tolerated with assist device (walker, cane, etc) as directed, use it as long as suggested by your surgeon or therapist, typically at least 4-6 weeks.   EXERCISES  Results after joint replacement surgery are often greatly improved when you follow the exercise, range of motion and muscle strengthening exercises prescribed by your doctor. Safety measures are also important to protect the joint from further injury. Any time any of these exercises cause you to have increased pain or swelling, decrease what you are doing until you are comfortable again and then slowly increase them. If you have problems or questions, call your caregiver or physical therapist for advice.   Rehabilitation is important following a joint replacement. After just a few days of immobilization, the muscles of the leg can become weakened and shrink (atrophy).  These exercises are designed to build up the tone and strength of the thigh and leg muscles and to improve motion. Often times heat used for twenty to thirty minutes before working out will loosen up your tissues and help with improving the range of motion but do not use heat for the first two weeks following surgery (sometimes heat can increase post-operative swelling).   These exercises can be done on a training (exercise) mat, on the floor, on a table or on a bed. Use whatever works the best and is most comfortable for you.    Use music or television while you are exercising so  that the exercises are a pleasant break in your day. This will make your life better with the exercises acting as a break in your routine that you can look forward to.   Perform all exercises about fifteen times, three times per day or as directed.  You should exercise both the operative leg and the other leg as well.  Exercises include:   Quad Sets - Tighten up the muscle on the front of the thigh (Quad) and hold for 5-10 seconds.   Straight Leg Raises - With your knee straight (if you were given a brace, keep it on), lift the leg to 60 degrees, hold for 3 seconds, and slowly lower the leg.  Perform this exercise against resistance later as your leg gets stronger.  Leg Slides: Lying on your back, slowly slide your foot toward your buttocks, bending your knee up off the floor (only go as far as is comfortable). Then slowly slide your foot back down until your leg is flat on the floor again.  Angel Wings: Lying on your back spread your legs to the side as far apart as you can without causing discomfort.  Hamstring Strength:  Lying on your back, push your heel against the floor with your leg straight by tightening up the muscles of your buttocks.  Repeat, but this time bend your knee to a comfortable angle, and push your heel against the floor.  You may put a pillow under the heel to make it more comfortable if necessary.   A rehabilitation program following joint replacement surgery can speed recovery and prevent re-injury in the future due to weakened muscles. Contact your doctor or a physical therapist for more information on knee rehabilitation.    CONSTIPATION  Constipation is defined medically as fewer than three stools per week and severe constipation as  less than one stool per week.  Even if you have a regular bowel pattern at home, your normal regimen is likely to be disrupted due to multiple reasons following surgery.  Combination of anesthesia, postoperative narcotics, change in appetite and  fluid intake all can affect your bowels.   YOU MUST use at least one of the following options; they are listed in order of increasing strength to get the job done.  They are all available over the counter, and you may need to use some, POSSIBLY even all of these options:    Drink plenty of fluids (prune juice may be helpful) and high fiber foods Colace 100 mg by mouth twice a day  Senokot for constipation as directed and as needed Dulcolax (bisacodyl), take with full glass of water  Miralax (polyethylene glycol) once or twice a day as needed.  If you have tried all these things and are unable to have a bowel movement in the first 3-4 days after surgery call either your surgeon or your primary doctor.    If you experience loose stools or diarrhea, hold the medications until you stool forms back up.  If your symptoms do not get better within 1 week or if they get worse, check with your doctor.  If you experience "the worst abdominal pain ever" or develop nausea or vomiting, please contact the office immediately for further recommendations for treatment.   ITCHING:  If you experience itching with your medications, try taking only a single pain pill, or even half a pain pill at a time.  You can also use Benadryl over the counter for itching or also to help with sleep.   TED HOSE STOCKINGS:  Use stockings on both legs until for at least 2 weeks or as directed by physician office. They may be removed at night for sleeping.  MEDICATIONS:  See your medication summary on the "After Visit Summary" that nursing will review with you.  You may have some home medications which will be placed on hold until you complete the course of blood thinner medication.  It is important for you to complete the blood thinner medication as prescribed.  PRECAUTIONS:  If you experience chest pain or shortness of breath - call 911 immediately for transfer to the hospital emergency department.   If you develop a fever greater  that 101 F, purulent drainage from wound, increased redness or drainage from wound, foul odor from the wound/dressing, or calf pain - CONTACT YOUR SURGEON.                                                   FOLLOW-UP APPOINTMENTS:  If you do not already have a post-op appointment, please call the office for an appointment to be seen by your surgeon.  Guidelines for how soon to be seen are listed in your "After Visit Summary", but are typically between 1-4 weeks after surgery.  OTHER INSTRUCTIONS:   Knee Replacement:  Do not place pillow under knee, focus on keeping the knee straight while resting. CPM instructions: 0-90 degrees, 2 hours in the morning, 2 hours in the afternoon, and 2 hours in the evening. Place foam block, curve side up under heel at all times except when in CPM or when walking.  DO NOT modify, tear, cut, or change the foam block in any way.  DENTAL ANTIBIOTICS:  In most cases prophylactic antibiotics for Dental procdeures after total joint surgery are not necessary.  Exceptions are as follows:  1. History of prior total joint infection  2. Severely immunocompromised (Organ Transplant, cancer chemotherapy, Rheumatoid biologic meds such as Scribner)  3. Poorly controlled diabetes (A1C &gt; 8.0, blood glucose over 200)  If you have one of these conditions, contact your surgeon for an antibiotic prescription, prior to your dental procedure.   MAKE SURE YOU:  Understand these instructions.  Get help right away if you are not doing well or get worse.    Thank you for letting us be a part of your medical care team.  It is a privilege we respect greatly.  We hope these instructions will help you stay on track for a fast and full recovery!   Increase activity slowly as tolerated   Complete by: As directed        Follow-up Information    Melrose Nakayama, MD. Schedule an appointment as soon as possible for a visit in 2 weeks.   Specialty: Orthopedic Surgery Contact  information: [place] [id number] [phone]                Signed: Larwance Sachs Trent Theisen 07/23/2019, 8:00 AM

## 2019-07-23 NOTE — Progress Notes (Signed)
Pt does not have a legal guaradian

## 2019-07-23 NOTE — Plan of Care (Signed)
Patient discharged home in stable condition 

## 2019-07-23 NOTE — TOC Transition Note (Signed)
Transition of Care [facility]) - CM/SW Discharge Note   Patient Details  Name: [name] Long MRN: [id number] Date of Birth: [birth year]  [facility] CM/SW Contact:  Lennart Pall, LCSW Phone Number: 07/23/2019, 10:01 AM   Clinical Narrative:   Met briefly with pt to review d/c needs.  Has DME already.  Aware referral made to [facility].  No TOC needs.    Final next level of care: [place] Barriers to Discharge: No Barriers Identified   Patient Goals and CMS Choice Patient states their goals for this hospitalization and ongoing recovery are:: go home      Discharge Placement                       Discharge Plan and Services                DME Arranged: N/A (has all needed DME) DME Agency: NA       HH Arranged: PT HH Agency: [facility] [employer] Date [place]:  (referral placed with agency PTA)      Social Determinants of Health (SDOH) Interventions     Readmission Risk Interventions No flowsheet data found.

## 2019-07-23 NOTE — Progress Notes (Signed)
Was created In error, pt does not have guardian

## 2019-07-23 NOTE — Evaluation (Signed)
Physical Therapy Evaluation Patient Details Name: [name] Long MRN: [id number] DOB: 01-18-1940 Today's Date: 07/23/2019   History of Present Illness  Pt s/p R TKR and with hx of MI, CABG, CAD, R THR and Fibromyalgia  Clinical Impression  Pt s/p R TKR and presents with decreased R LE strength/ROM and post op pain limiting functional mobility.  Pt should progress to dc home with family assist and HHPT follow up.    Follow Up Recommendations Home health PT;Follow surgeon's recommendation for DC plan and follow-up therapies    Equipment Recommendations  None recommended by PT    Recommendations for Other Services       Precautions / Restrictions Precautions Precautions: Fall;Knee Restrictions Weight Bearing Restrictions: No Other Position/Activity Restrictions: WBAT      Mobility  Bed Mobility Overal bed mobility: Needs Assistance Bed Mobility: Supine to Sit     Supine to sit: Supervision        Transfers Overall transfer level: Needs assistance Equipment used: Rolling walker (2 wheeled) Transfers: Sit to/from Stand Sit to Stand: Min assist;Min guard         General transfer comment: cues for LE management and use of UEs to self assist  Ambulation/Gait Ambulation/Gait assistance: Min assist;Min guard Gait Distance (Feet): 125 Feet Assistive device: Rolling walker (2 wheeled) Gait Pattern/deviations: Step-to pattern;Step-through pattern;Decreased step length - right;Decreased step length - left;Shuffle;Trunk flexed     General Gait Details: cues for sequence, posture, position from RW, stride length, and to slow pace for safety  Stairs            Wheelchair Mobility    Modified Rankin (Stroke Patients Only)       Balance Overall balance assessment: Mild deficits observed, not formally tested                                           Pertinent Vitals/Pain Pain Assessment: 0-10 Pain Score: 7  Pain Location: R knee Pain  Descriptors / Indicators: Aching;Sore Pain Intervention(s): Limited activity within patient's tolerance;Monitored during session;Premedicated before session;Ice applied    Home Living Family/patient expects to be discharged to:: Private residence Living Arrangements: Spouse/significant other Available Help at Discharge: Family Type of Home: House Home Access: Stairs to enter Entrance Stairs-Rails: None Entrance Stairs-Number of Steps: 4 Home Layout: Two level;Able to live on main level with bedroom/bathroom Home Equipment: Gilford Rile - 2 wheels;Bedside commode;Shower seat - built in      Prior Function Level of Independence: Independent               Hand Dominance   Dominant Hand: Right    Extremity/Trunk Assessment   Upper Extremity Assessment Upper Extremity Assessment: Overall WFL for tasks assessed    Lower Extremity Assessment Lower Extremity Assessment: RLE deficits/detail RLE Deficits / Details: 3/5 quads with AAROM at knee -5 - 40 - pain limited    Cervical / Trunk Assessment Cervical / Trunk Assessment: Normal  Communication   Communication: No difficulties  Cognition Arousal/Alertness: Awake/alert Behavior During Therapy: WFL for tasks assessed/performed Overall Cognitive Status: Within Functional Limits for tasks assessed                                        General Comments      Exercises Total Joint Exercises  Ankle Circles/Pumps: AROM;Both;15 reps;Supine Quad Sets: AROM;Both;10 reps;Supine Heel Slides: AAROM;Right;15 reps;Supine Straight Leg Raises: AAROM;AROM;Right;10 reps;Supine   Assessment/Plan    PT Assessment Patient needs continued PT services  PT Problem List Decreased strength;Decreased range of motion;Decreased activity tolerance;Decreased balance;Decreased mobility;Decreased knowledge of use of DME;Pain       PT Treatment Interventions DME instruction;Gait training;Stair training;Functional mobility  training;Therapeutic activities;Therapeutic exercise;Patient/family education    PT Goals (Current goals can be found in the Care Plan section)  Acute Rehab PT Goals Patient Stated Goal: Get back to gardening PT Goal Formulation: With patient Time For Goal Achievement: 07/30/19 Potential to Achieve Goals: Good    Frequency 7X/week   Barriers to discharge        Co-evaluation               AM-PAC PT "6 Clicks" Mobility  Outcome Measure Help needed turning from your back to your side while in a flat bed without using bedrails?: A Little Help needed moving from lying on your back to sitting on the side of a flat bed without using bedrails?: A Little Help needed moving to and from a bed to a chair (including a wheelchair)?: A Little Help needed standing up from a chair using your arms (e.g., wheelchair or bedside chair)?: A Little Help needed to walk in hospital room?: A Little Help needed climbing 3-5 steps with a railing? : A Little 6 Click Score: 18    End of Session Equipment Utilized During Treatment: Gait belt Activity Tolerance: Patient tolerated treatment well Patient left: in chair;with call bell/phone within reach Nurse Communication: Mobility status PT Visit Diagnosis: Difficulty in walking, not elsewhere classified (R26.2)    Time: 9390-3009 PT Time Calculation (min) (ACUTE ONLY): 38 min   Charges:   PT Evaluation $PT Eval Low Complexity: 1 Low PT Treatments $Gait Training: 8-22 mins $Therapeutic Exercise: 8-22 mins        Debe Coder PT Acute Rehabilitation Services Pager [phone] Office [phone]   Navil Kole 07/23/2019, 12:42 PM

## 2019-07-24 DIAGNOSIS — G43909 Migraine, unspecified, not intractable, without status migrainosus: Secondary | ICD-10-CM | POA: Diagnosis not present

## 2019-07-24 DIAGNOSIS — M797 Fibromyalgia: Secondary | ICD-10-CM | POA: Diagnosis not present

## 2019-07-24 DIAGNOSIS — Z471 Aftercare following joint replacement surgery: Secondary | ICD-10-CM | POA: Diagnosis not present

## 2019-07-24 DIAGNOSIS — M069 Rheumatoid arthritis, unspecified: Secondary | ICD-10-CM | POA: Diagnosis not present

## 2019-07-24 DIAGNOSIS — Z7951 Long term (current) use of inhaled steroids: Secondary | ICD-10-CM | POA: Diagnosis not present

## 2019-07-24 DIAGNOSIS — E785 Hyperlipidemia, unspecified: Secondary | ICD-10-CM | POA: Diagnosis not present

## 2019-07-24 DIAGNOSIS — I10 Essential (primary) hypertension: Secondary | ICD-10-CM | POA: Diagnosis not present

## 2019-07-24 DIAGNOSIS — Z96651 Presence of right artificial knee joint: Secondary | ICD-10-CM | POA: Diagnosis not present

## 2019-07-24 DIAGNOSIS — M8580 Other specified disorders of bone density and structure, unspecified site: Secondary | ICD-10-CM | POA: Diagnosis not present

## 2019-07-24 DIAGNOSIS — M810 Age-related osteoporosis without current pathological fracture: Secondary | ICD-10-CM | POA: Diagnosis not present

## 2019-07-24 DIAGNOSIS — I2510 Atherosclerotic heart disease of native coronary artery without angina pectoris: Secondary | ICD-10-CM | POA: Diagnosis not present

## 2019-07-24 DIAGNOSIS — H9190 Unspecified hearing loss, unspecified ear: Secondary | ICD-10-CM | POA: Diagnosis not present

## 2019-07-24 DIAGNOSIS — H3530 Unspecified macular degeneration: Secondary | ICD-10-CM | POA: Diagnosis not present

## 2019-07-24 DIAGNOSIS — Z86010 Personal history of colonic polyps: Secondary | ICD-10-CM | POA: Diagnosis not present

## 2019-07-24 DIAGNOSIS — I252 Old myocardial infarction: Secondary | ICD-10-CM | POA: Diagnosis not present

## 2019-07-24 DIAGNOSIS — Z87891 Personal history of nicotine dependence: Secondary | ICD-10-CM | POA: Diagnosis not present

## 2019-07-24 DIAGNOSIS — J45909 Unspecified asthma, uncomplicated: Secondary | ICD-10-CM | POA: Diagnosis not present

## 2019-07-24 DIAGNOSIS — H9319 Tinnitus, unspecified ear: Secondary | ICD-10-CM | POA: Diagnosis not present

## 2019-07-24 DIAGNOSIS — Z85528 Personal history of other malignant neoplasm of kidney: Secondary | ICD-10-CM | POA: Diagnosis not present

## 2019-07-24 DIAGNOSIS — K219 Gastro-esophageal reflux disease without esophagitis: Secondary | ICD-10-CM | POA: Diagnosis not present

## 2019-07-29 ENCOUNTER — Other Ambulatory Visit: Payer: Self-pay

## 2019-08-01 DIAGNOSIS — Z9889 Other specified postprocedural states: Secondary | ICD-10-CM | POA: Diagnosis not present

## 2019-08-04 DIAGNOSIS — Z79891 Long term (current) use of opiate analgesic: Secondary | ICD-10-CM | POA: Diagnosis not present

## 2019-08-04 DIAGNOSIS — M150 Primary generalized (osteo)arthritis: Secondary | ICD-10-CM | POA: Diagnosis not present

## 2019-08-04 DIAGNOSIS — G894 Chronic pain syndrome: Secondary | ICD-10-CM | POA: Diagnosis not present

## 2019-08-04 DIAGNOSIS — M47812 Spondylosis without myelopathy or radiculopathy, cervical region: Secondary | ICD-10-CM | POA: Diagnosis not present

## 2019-08-06 ENCOUNTER — Telehealth: Payer: Self-pay

## 2019-08-06 NOTE — Telephone Encounter (Signed)
Pt. Was recently in the hospital for rt. Knee surgery and is to f/u with the orthopedic surgeon. Her medications were gone over and reconciled.

## 2019-08-11 DIAGNOSIS — M6281 Muscle weakness (generalized): Secondary | ICD-10-CM | POA: Diagnosis not present

## 2019-08-11 DIAGNOSIS — Z96651 Presence of right artificial knee joint: Secondary | ICD-10-CM | POA: Diagnosis not present

## 2019-08-11 DIAGNOSIS — Z471 Aftercare following joint replacement surgery: Secondary | ICD-10-CM | POA: Diagnosis not present

## 2019-08-11 DIAGNOSIS — R269 Unspecified abnormalities of gait and mobility: Secondary | ICD-10-CM | POA: Diagnosis not present

## 2019-08-13 DIAGNOSIS — I700 Atherosclerosis of aorta: Secondary | ICD-10-CM | POA: Diagnosis not present

## 2019-08-13 DIAGNOSIS — N289 Disorder of kidney and ureter, unspecified: Secondary | ICD-10-CM | POA: Diagnosis not present

## 2019-08-13 DIAGNOSIS — C641 Malignant neoplasm of right kidney, except renal pelvis: Secondary | ICD-10-CM | POA: Diagnosis not present

## 2019-08-13 DIAGNOSIS — K838 Other specified diseases of biliary tract: Secondary | ICD-10-CM | POA: Diagnosis not present

## 2019-08-13 DIAGNOSIS — K828 Other specified diseases of gallbladder: Secondary | ICD-10-CM | POA: Diagnosis not present

## 2019-08-14 DIAGNOSIS — Z96651 Presence of right artificial knee joint: Secondary | ICD-10-CM | POA: Diagnosis not present

## 2019-08-14 DIAGNOSIS — R269 Unspecified abnormalities of gait and mobility: Secondary | ICD-10-CM | POA: Diagnosis not present

## 2019-08-14 DIAGNOSIS — Z471 Aftercare following joint replacement surgery: Secondary | ICD-10-CM | POA: Diagnosis not present

## 2019-08-14 DIAGNOSIS — M6281 Muscle weakness (generalized): Secondary | ICD-10-CM | POA: Diagnosis not present

## 2019-08-15 DIAGNOSIS — C641 Malignant neoplasm of right kidney, except renal pelvis: Secondary | ICD-10-CM | POA: Diagnosis not present

## 2019-08-15 DIAGNOSIS — R3914 Feeling of incomplete bladder emptying: Secondary | ICD-10-CM | POA: Diagnosis not present

## 2019-08-17 IMAGING — DX DG CHEST 2V
2 series · 2 of 2 positions shown · non-contrast
Comparison: Two-view chest x-ray 10/04/2017.

CLINICAL DATA: Productive cough.

EXAM:
CHEST - 2 VIEW

[chest pa]
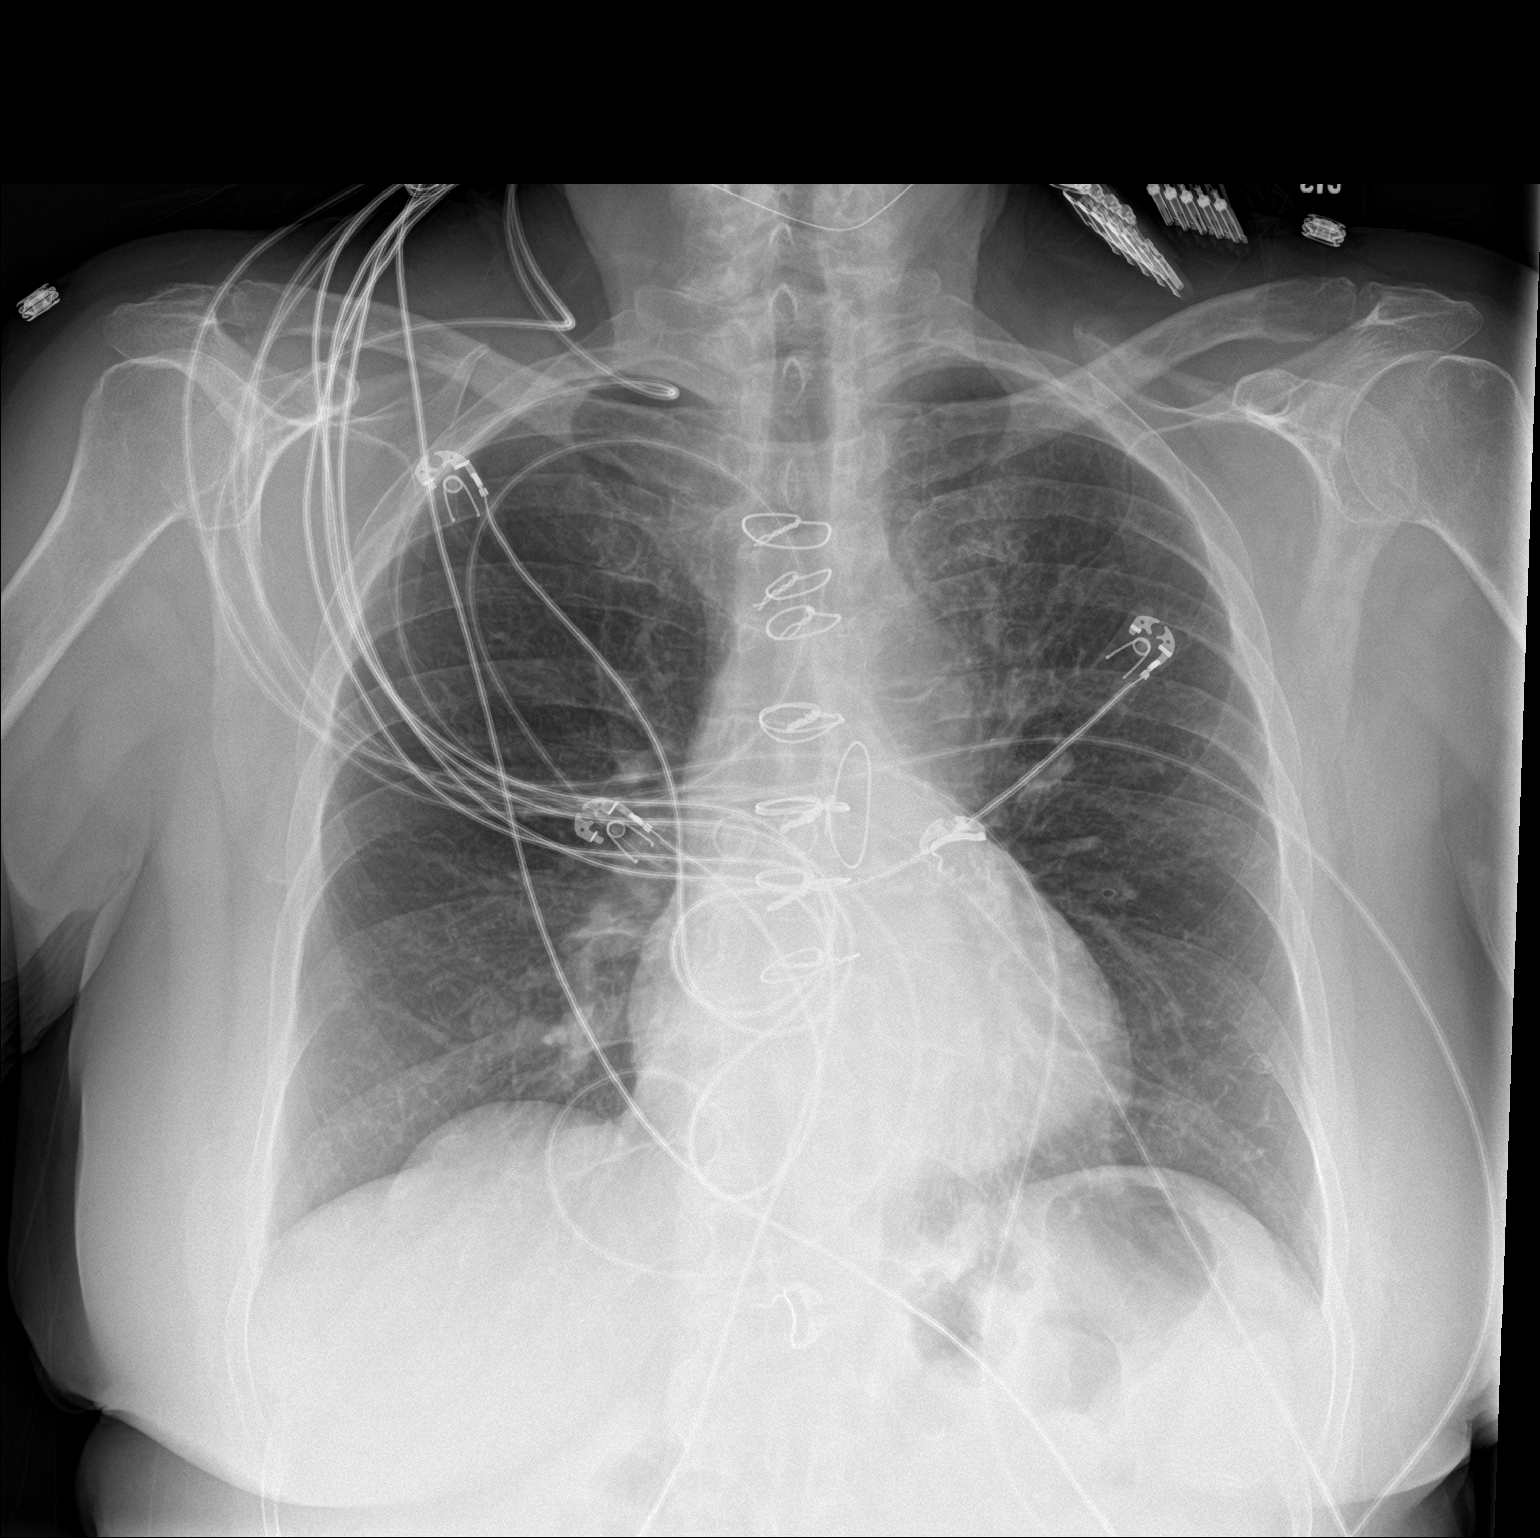

[chest lat]
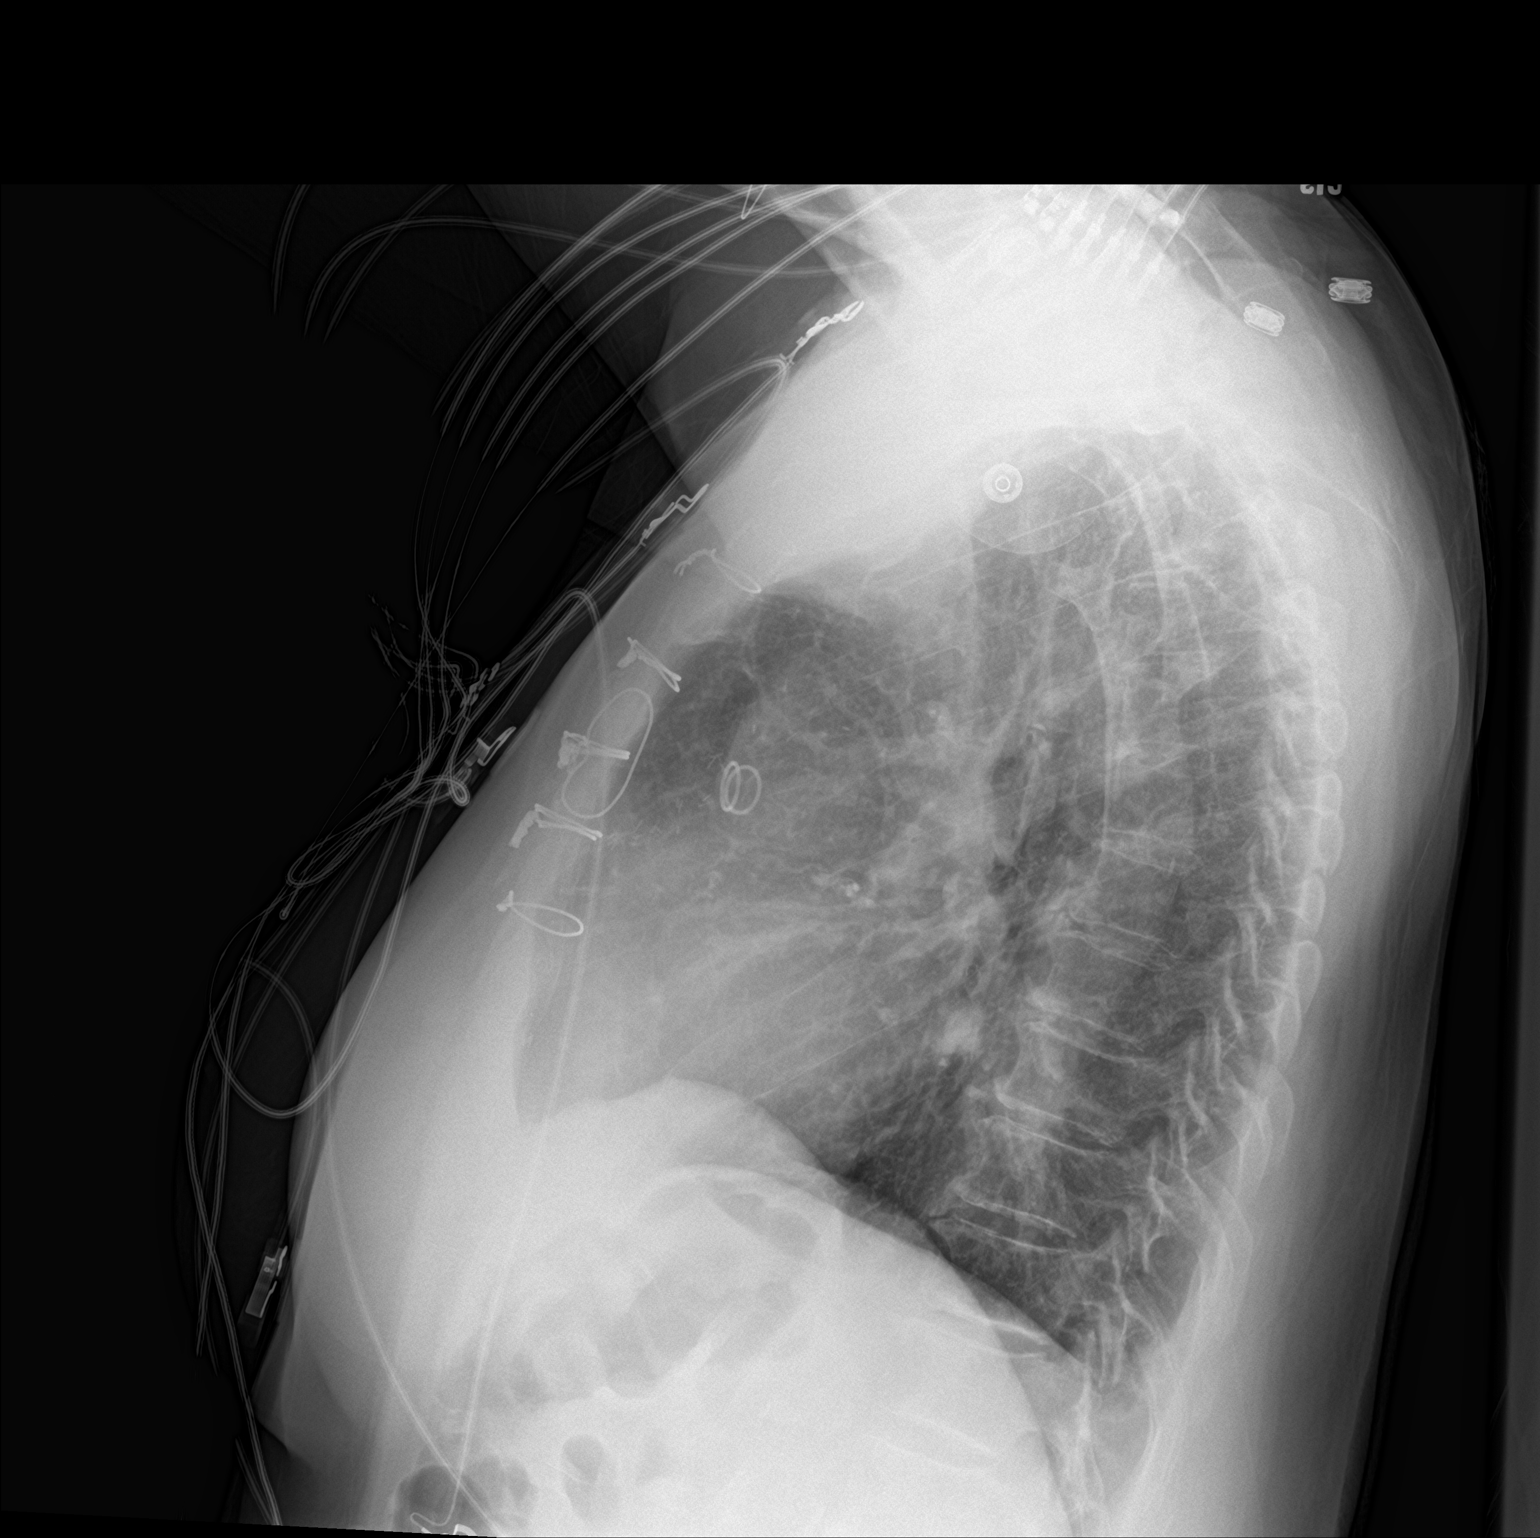

[2 of 2 positions shown; findings below may reference images not displayed]

FINDINGS: The heart size is normal. Bilateral medial upper lobe airspace
opacities are new since the prior exam, left greater than right.
There is no edema or effusion. No other airspace consolidation is
present. Degenerative changes of the thoracic spine are stable.
IMPRESSION: 1. New left greater than right upper lobe airspace disease is
concerning for pneumonia.

## 2019-08-18 DIAGNOSIS — Z96651 Presence of right artificial knee joint: Secondary | ICD-10-CM | POA: Diagnosis not present

## 2019-08-18 DIAGNOSIS — R269 Unspecified abnormalities of gait and mobility: Secondary | ICD-10-CM | POA: Diagnosis not present

## 2019-08-18 DIAGNOSIS — M6281 Muscle weakness (generalized): Secondary | ICD-10-CM | POA: Diagnosis not present

## 2019-08-18 DIAGNOSIS — Z471 Aftercare following joint replacement surgery: Secondary | ICD-10-CM | POA: Diagnosis not present

## 2019-08-20 DIAGNOSIS — Z471 Aftercare following joint replacement surgery: Secondary | ICD-10-CM | POA: Diagnosis not present

## 2019-08-20 DIAGNOSIS — R269 Unspecified abnormalities of gait and mobility: Secondary | ICD-10-CM | POA: Diagnosis not present

## 2019-08-20 DIAGNOSIS — Z96651 Presence of right artificial knee joint: Secondary | ICD-10-CM | POA: Diagnosis not present

## 2019-08-20 DIAGNOSIS — M6281 Muscle weakness (generalized): Secondary | ICD-10-CM | POA: Diagnosis not present

## 2019-08-22 DIAGNOSIS — R269 Unspecified abnormalities of gait and mobility: Secondary | ICD-10-CM | POA: Diagnosis not present

## 2019-08-22 DIAGNOSIS — Z471 Aftercare following joint replacement surgery: Secondary | ICD-10-CM | POA: Diagnosis not present

## 2019-08-22 DIAGNOSIS — M6281 Muscle weakness (generalized): Secondary | ICD-10-CM | POA: Diagnosis not present

## 2019-08-22 DIAGNOSIS — Z96651 Presence of right artificial knee joint: Secondary | ICD-10-CM | POA: Diagnosis not present

## 2019-08-25 DIAGNOSIS — Z9889 Other specified postprocedural states: Secondary | ICD-10-CM | POA: Diagnosis not present

## 2019-08-25 IMAGING — DX DG CHEST 2V
2 series · 2 of 2 positions shown · non-contrast
Comparison: 11/19/2017

CLINICAL DATA: Preop partial nephrectomy

EXAM:
CHEST - 2 VIEW

[chest pa]
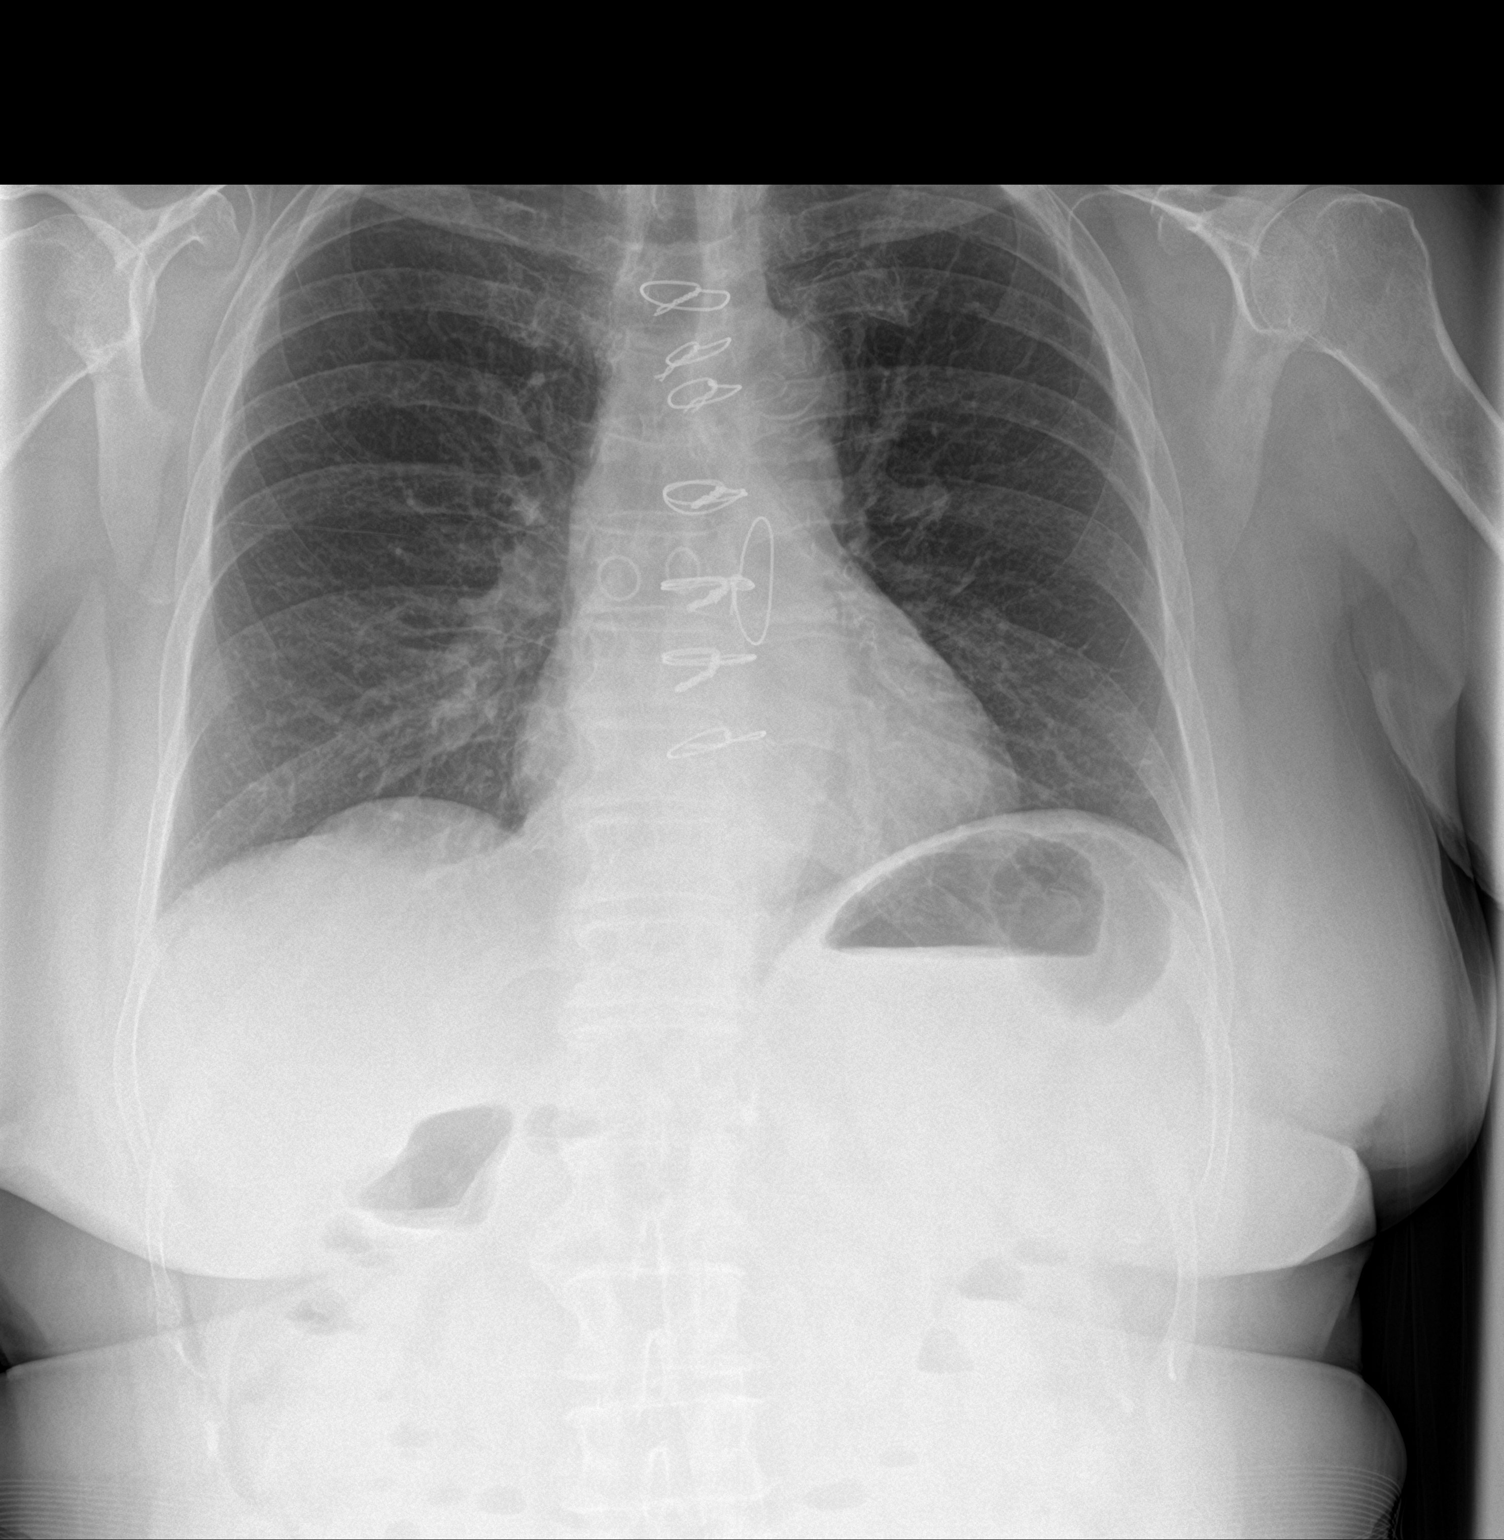

[chest lat]
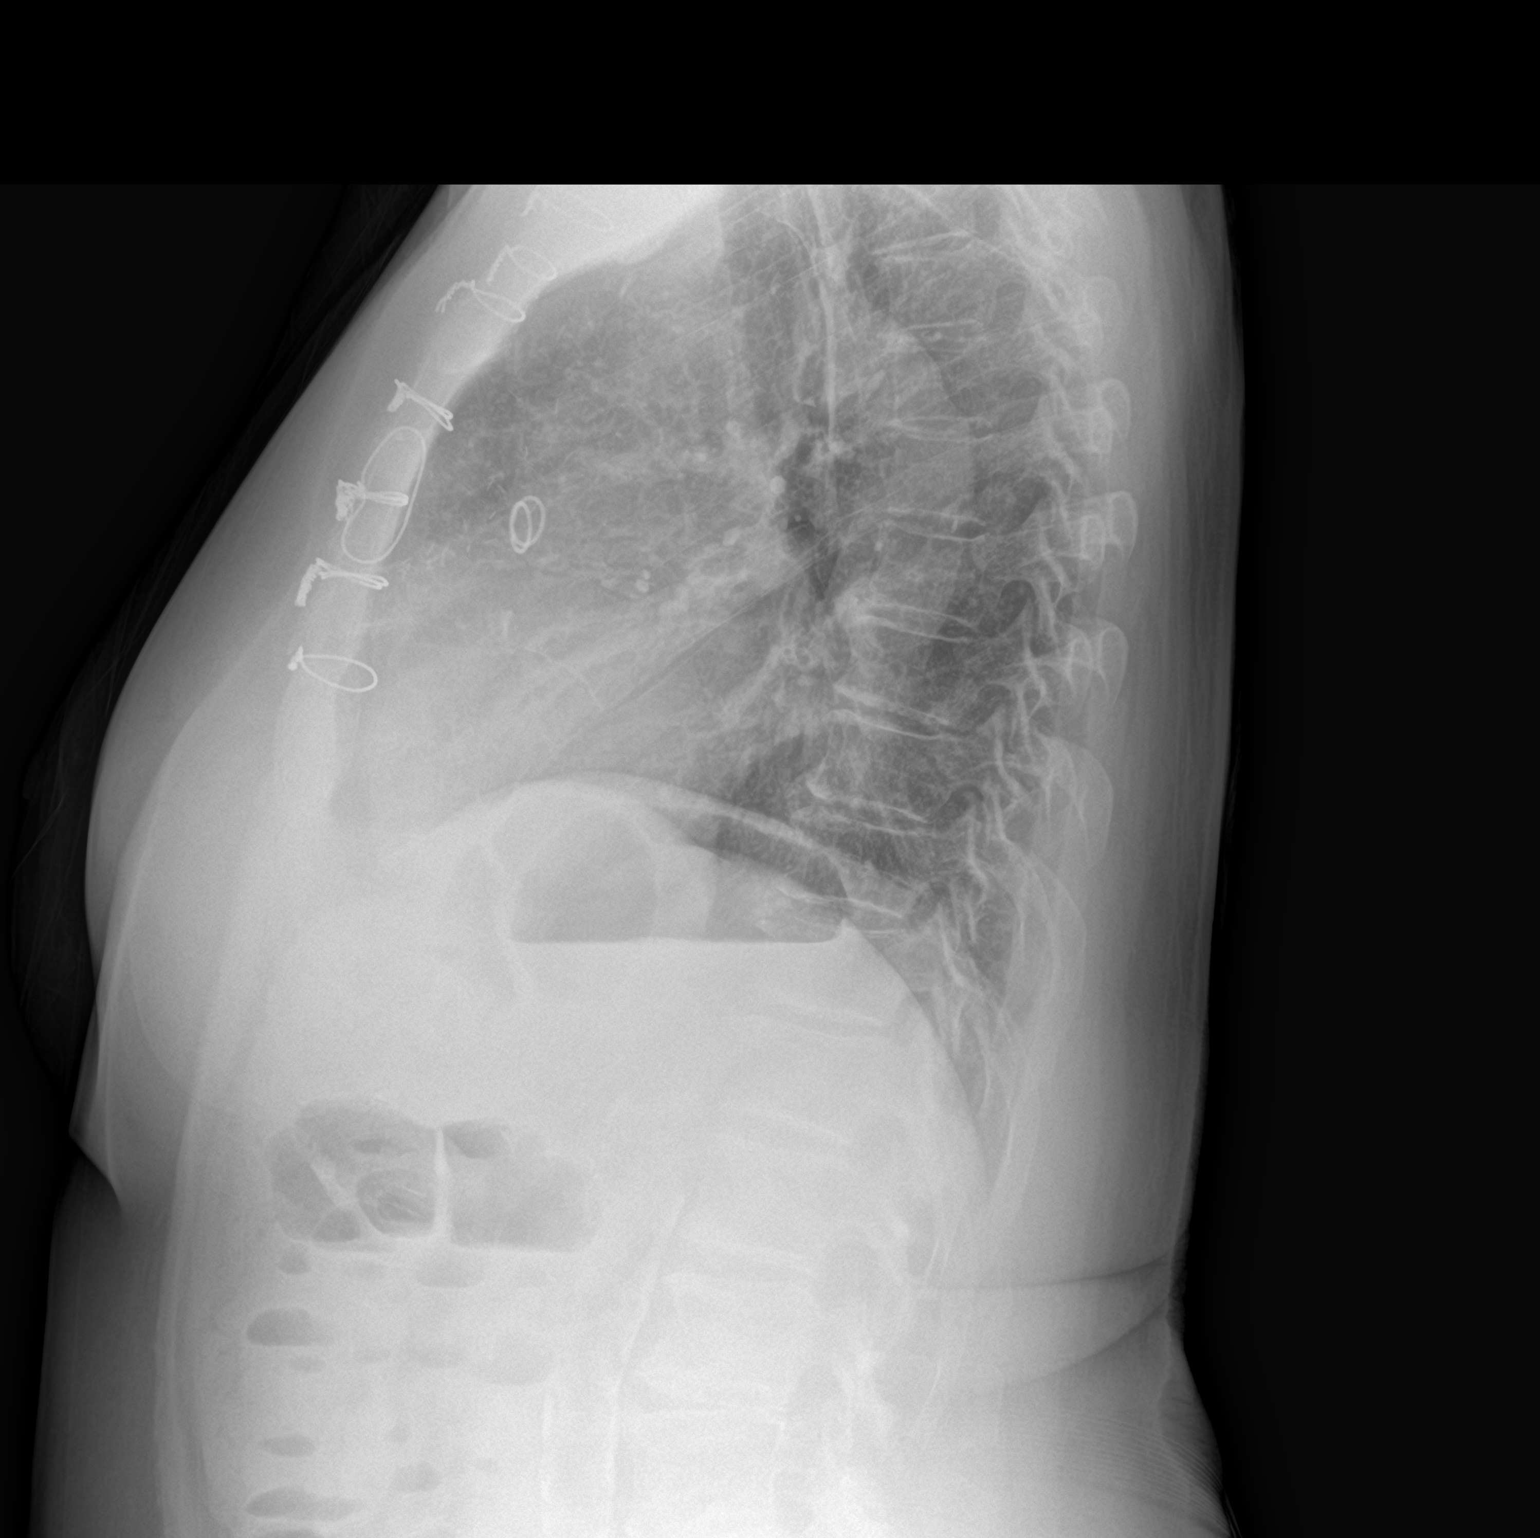

[2 of 2 positions shown; findings below may reference images not displayed]

FINDINGS: Prior median sternotomy and CABG. Heart is normal size. Lungs are
clear. No effusions. No acute bony abnormality.
IMPRESSION: No active cardiopulmonary disease.

## 2019-08-26 DIAGNOSIS — M2681 Anterior soft tissue impingement: Secondary | ICD-10-CM | POA: Diagnosis not present

## 2019-08-26 DIAGNOSIS — R269 Unspecified abnormalities of gait and mobility: Secondary | ICD-10-CM | POA: Diagnosis not present

## 2019-08-26 DIAGNOSIS — Z96651 Presence of right artificial knee joint: Secondary | ICD-10-CM | POA: Diagnosis not present

## 2019-08-26 DIAGNOSIS — Z471 Aftercare following joint replacement surgery: Secondary | ICD-10-CM | POA: Diagnosis not present

## 2019-08-29 ENCOUNTER — Encounter: Payer: Self-pay | Admitting: Family Medicine

## 2019-08-29 DIAGNOSIS — M6281 Muscle weakness (generalized): Secondary | ICD-10-CM | POA: Diagnosis not present

## 2019-08-29 DIAGNOSIS — Z96651 Presence of right artificial knee joint: Secondary | ICD-10-CM | POA: Diagnosis not present

## 2019-08-29 DIAGNOSIS — Z471 Aftercare following joint replacement surgery: Secondary | ICD-10-CM | POA: Diagnosis not present

## 2019-08-29 DIAGNOSIS — R269 Unspecified abnormalities of gait and mobility: Secondary | ICD-10-CM | POA: Diagnosis not present

## 2019-09-01 DIAGNOSIS — M150 Primary generalized (osteo)arthritis: Secondary | ICD-10-CM | POA: Diagnosis not present

## 2019-09-01 DIAGNOSIS — M6281 Muscle weakness (generalized): Secondary | ICD-10-CM | POA: Diagnosis not present

## 2019-09-01 DIAGNOSIS — R269 Unspecified abnormalities of gait and mobility: Secondary | ICD-10-CM | POA: Diagnosis not present

## 2019-09-01 DIAGNOSIS — G894 Chronic pain syndrome: Secondary | ICD-10-CM | POA: Diagnosis not present

## 2019-09-01 DIAGNOSIS — Z79891 Long term (current) use of opiate analgesic: Secondary | ICD-10-CM | POA: Diagnosis not present

## 2019-09-01 DIAGNOSIS — Z96651 Presence of right artificial knee joint: Secondary | ICD-10-CM | POA: Diagnosis not present

## 2019-09-01 DIAGNOSIS — M47812 Spondylosis without myelopathy or radiculopathy, cervical region: Secondary | ICD-10-CM | POA: Diagnosis not present

## 2019-09-01 DIAGNOSIS — Z471 Aftercare following joint replacement surgery: Secondary | ICD-10-CM | POA: Diagnosis not present

## 2019-09-03 DIAGNOSIS — R269 Unspecified abnormalities of gait and mobility: Secondary | ICD-10-CM | POA: Diagnosis not present

## 2019-09-03 DIAGNOSIS — Z471 Aftercare following joint replacement surgery: Secondary | ICD-10-CM | POA: Diagnosis not present

## 2019-09-03 DIAGNOSIS — M6281 Muscle weakness (generalized): Secondary | ICD-10-CM | POA: Diagnosis not present

## 2019-09-03 DIAGNOSIS — Z96651 Presence of right artificial knee joint: Secondary | ICD-10-CM | POA: Diagnosis not present

## 2019-09-05 DIAGNOSIS — M6281 Muscle weakness (generalized): Secondary | ICD-10-CM | POA: Diagnosis not present

## 2019-09-05 DIAGNOSIS — Z96651 Presence of right artificial knee joint: Secondary | ICD-10-CM | POA: Diagnosis not present

## 2019-09-05 DIAGNOSIS — R269 Unspecified abnormalities of gait and mobility: Secondary | ICD-10-CM | POA: Diagnosis not present

## 2019-09-05 DIAGNOSIS — Z471 Aftercare following joint replacement surgery: Secondary | ICD-10-CM | POA: Diagnosis not present

## 2019-09-09 DIAGNOSIS — M6281 Muscle weakness (generalized): Secondary | ICD-10-CM | POA: Diagnosis not present

## 2019-09-09 DIAGNOSIS — Z471 Aftercare following joint replacement surgery: Secondary | ICD-10-CM | POA: Diagnosis not present

## 2019-09-09 DIAGNOSIS — R269 Unspecified abnormalities of gait and mobility: Secondary | ICD-10-CM | POA: Diagnosis not present

## 2019-09-09 DIAGNOSIS — Z96651 Presence of right artificial knee joint: Secondary | ICD-10-CM | POA: Diagnosis not present

## 2019-09-10 DIAGNOSIS — M6281 Muscle weakness (generalized): Secondary | ICD-10-CM | POA: Diagnosis not present

## 2019-09-10 DIAGNOSIS — Z96651 Presence of right artificial knee joint: Secondary | ICD-10-CM | POA: Diagnosis not present

## 2019-09-10 DIAGNOSIS — Z471 Aftercare following joint replacement surgery: Secondary | ICD-10-CM | POA: Diagnosis not present

## 2019-09-10 DIAGNOSIS — R269 Unspecified abnormalities of gait and mobility: Secondary | ICD-10-CM | POA: Diagnosis not present

## 2019-09-12 DIAGNOSIS — Z471 Aftercare following joint replacement surgery: Secondary | ICD-10-CM | POA: Diagnosis not present

## 2019-09-12 DIAGNOSIS — Z96651 Presence of right artificial knee joint: Secondary | ICD-10-CM | POA: Diagnosis not present

## 2019-09-12 DIAGNOSIS — M6281 Muscle weakness (generalized): Secondary | ICD-10-CM | POA: Diagnosis not present

## 2019-09-12 DIAGNOSIS — R269 Unspecified abnormalities of gait and mobility: Secondary | ICD-10-CM | POA: Diagnosis not present

## 2019-09-15 DIAGNOSIS — M6281 Muscle weakness (generalized): Secondary | ICD-10-CM | POA: Diagnosis not present

## 2019-09-15 DIAGNOSIS — R269 Unspecified abnormalities of gait and mobility: Secondary | ICD-10-CM | POA: Diagnosis not present

## 2019-09-15 DIAGNOSIS — Z471 Aftercare following joint replacement surgery: Secondary | ICD-10-CM | POA: Diagnosis not present

## 2019-09-15 DIAGNOSIS — Z96651 Presence of right artificial knee joint: Secondary | ICD-10-CM | POA: Diagnosis not present

## 2019-09-17 DIAGNOSIS — Z96651 Presence of right artificial knee joint: Secondary | ICD-10-CM | POA: Diagnosis not present

## 2019-09-17 DIAGNOSIS — M6281 Muscle weakness (generalized): Secondary | ICD-10-CM | POA: Diagnosis not present

## 2019-09-17 DIAGNOSIS — R269 Unspecified abnormalities of gait and mobility: Secondary | ICD-10-CM | POA: Diagnosis not present

## 2019-09-17 DIAGNOSIS — Z471 Aftercare following joint replacement surgery: Secondary | ICD-10-CM | POA: Diagnosis not present

## 2019-09-19 DIAGNOSIS — R269 Unspecified abnormalities of gait and mobility: Secondary | ICD-10-CM | POA: Diagnosis not present

## 2019-09-19 DIAGNOSIS — M6281 Muscle weakness (generalized): Secondary | ICD-10-CM | POA: Diagnosis not present

## 2019-09-19 DIAGNOSIS — Z96651 Presence of right artificial knee joint: Secondary | ICD-10-CM | POA: Diagnosis not present

## 2019-09-19 DIAGNOSIS — Z471 Aftercare following joint replacement surgery: Secondary | ICD-10-CM | POA: Diagnosis not present

## 2019-09-22 DIAGNOSIS — Z9889 Other specified postprocedural states: Secondary | ICD-10-CM | POA: Diagnosis not present

## 2019-09-23 DIAGNOSIS — R269 Unspecified abnormalities of gait and mobility: Secondary | ICD-10-CM | POA: Diagnosis not present

## 2019-09-23 DIAGNOSIS — Z96651 Presence of right artificial knee joint: Secondary | ICD-10-CM | POA: Diagnosis not present

## 2019-09-23 DIAGNOSIS — Z471 Aftercare following joint replacement surgery: Secondary | ICD-10-CM | POA: Diagnosis not present

## 2019-09-23 DIAGNOSIS — M6281 Muscle weakness (generalized): Secondary | ICD-10-CM | POA: Diagnosis not present

## 2019-09-25 DIAGNOSIS — Z471 Aftercare following joint replacement surgery: Secondary | ICD-10-CM | POA: Diagnosis not present

## 2019-09-25 DIAGNOSIS — R269 Unspecified abnormalities of gait and mobility: Secondary | ICD-10-CM | POA: Diagnosis not present

## 2019-09-25 DIAGNOSIS — Z96651 Presence of right artificial knee joint: Secondary | ICD-10-CM | POA: Diagnosis not present

## 2019-09-25 DIAGNOSIS — M6281 Muscle weakness (generalized): Secondary | ICD-10-CM | POA: Diagnosis not present

## 2019-09-30 DIAGNOSIS — M85852 Other specified disorders of bone density and structure, left thigh: Secondary | ICD-10-CM | POA: Diagnosis not present

## 2019-09-30 DIAGNOSIS — Z471 Aftercare following joint replacement surgery: Secondary | ICD-10-CM | POA: Diagnosis not present

## 2019-09-30 DIAGNOSIS — M6281 Muscle weakness (generalized): Secondary | ICD-10-CM | POA: Diagnosis not present

## 2019-09-30 DIAGNOSIS — Z96651 Presence of right artificial knee joint: Secondary | ICD-10-CM | POA: Diagnosis not present

## 2019-09-30 DIAGNOSIS — R928 Other abnormal and inconclusive findings on diagnostic imaging of breast: Secondary | ICD-10-CM | POA: Diagnosis not present

## 2019-09-30 DIAGNOSIS — R269 Unspecified abnormalities of gait and mobility: Secondary | ICD-10-CM | POA: Diagnosis not present

## 2019-09-30 LAB — HM MAMMOGRAPHY

## 2019-09-30 LAB — HM DEXA SCAN

## 2019-10-02 DIAGNOSIS — Z471 Aftercare following joint replacement surgery: Secondary | ICD-10-CM | POA: Diagnosis not present

## 2019-10-02 DIAGNOSIS — Z96651 Presence of right artificial knee joint: Secondary | ICD-10-CM | POA: Diagnosis not present

## 2019-10-02 DIAGNOSIS — M6281 Muscle weakness (generalized): Secondary | ICD-10-CM | POA: Diagnosis not present

## 2019-10-02 DIAGNOSIS — R269 Unspecified abnormalities of gait and mobility: Secondary | ICD-10-CM | POA: Diagnosis not present

## 2019-10-07 DIAGNOSIS — R269 Unspecified abnormalities of gait and mobility: Secondary | ICD-10-CM | POA: Diagnosis not present

## 2019-10-07 DIAGNOSIS — Z471 Aftercare following joint replacement surgery: Secondary | ICD-10-CM | POA: Diagnosis not present

## 2019-10-07 DIAGNOSIS — M6281 Muscle weakness (generalized): Secondary | ICD-10-CM | POA: Diagnosis not present

## 2019-10-07 DIAGNOSIS — Z96651 Presence of right artificial knee joint: Secondary | ICD-10-CM | POA: Diagnosis not present

## 2019-10-08 ENCOUNTER — Encounter: Payer: Self-pay | Admitting: Family Medicine

## 2019-10-13 ENCOUNTER — Other Ambulatory Visit: Payer: Self-pay

## 2019-10-13 ENCOUNTER — Ambulatory Visit (INDEPENDENT_AMBULATORY_CARE_PROVIDER_SITE_OTHER): Payer: PPO | Admitting: Family Medicine

## 2019-10-13 ENCOUNTER — Encounter: Payer: Self-pay | Admitting: Family Medicine

## 2019-10-13 VITALS — BP 132/80 | HR 75 | Temp 98.7°F | Wt 170.8 lb

## 2019-10-13 DIAGNOSIS — M150 Primary generalized (osteo)arthritis: Secondary | ICD-10-CM | POA: Diagnosis not present

## 2019-10-13 DIAGNOSIS — M47812 Spondylosis without myelopathy or radiculopathy, cervical region: Secondary | ICD-10-CM | POA: Diagnosis not present

## 2019-10-13 DIAGNOSIS — L089 Local infection of the skin and subcutaneous tissue, unspecified: Secondary | ICD-10-CM | POA: Diagnosis not present

## 2019-10-13 DIAGNOSIS — G894 Chronic pain syndrome: Secondary | ICD-10-CM | POA: Diagnosis not present

## 2019-10-13 DIAGNOSIS — Z23 Encounter for immunization: Secondary | ICD-10-CM | POA: Diagnosis not present

## 2019-10-13 DIAGNOSIS — L723 Sebaceous cyst: Secondary | ICD-10-CM

## 2019-10-13 DIAGNOSIS — Z79891 Long term (current) use of opiate analgesic: Secondary | ICD-10-CM | POA: Diagnosis not present

## 2019-10-13 NOTE — Progress Notes (Signed)
   Subjective:    Patient ID: [name] Long, female    DOB: [birth year], 79 y.o.   MRN: [id number]  HPI She has a lesion on her lower mid neck area posteriorly it is come more tender and red.  She is also interested in getting her immunizations updated.   Review of Systems     Objective:   Physical Exam She has a 1 and half centimeter raised fluctuant erythematous lesion on the mid posterior back area.       Assessment & Plan:  Infected sebaceous cyst  Need for influenza vaccination - Plan: Flu Vaccine QUAD High Dose(Fluad), CANCELED: Flu vaccine HIGH DOSE PF (Fluzone High dose)  Immunization, viral disease - Plan: Pfizer SARS-COV-2 Vaccine She does indeed have an infected sebaceous cyst.  It was injected with Xylocaine and epinephrine.  An I&D was performed.  Purulent material was expressed.  The wound was cleaned and packed with iodoform.  She is to leave the dressing in place for 2 days and then remove it.  Her immunizations were also updated.

## 2019-10-14 ENCOUNTER — Encounter: Payer: Self-pay | Admitting: Family Medicine

## 2019-10-17 ENCOUNTER — Other Ambulatory Visit: Payer: Self-pay | Admitting: Cardiovascular Disease

## 2019-10-20 ENCOUNTER — Other Ambulatory Visit: Payer: Self-pay | Admitting: Cardiovascular Disease

## 2019-10-22 DIAGNOSIS — Z96651 Presence of right artificial knee joint: Secondary | ICD-10-CM | POA: Diagnosis not present

## 2019-10-22 DIAGNOSIS — Z9889 Other specified postprocedural states: Secondary | ICD-10-CM | POA: Diagnosis not present

## 2019-12-04 ENCOUNTER — Other Ambulatory Visit: Payer: Self-pay

## 2019-12-04 ENCOUNTER — Encounter (INDEPENDENT_AMBULATORY_CARE_PROVIDER_SITE_OTHER): Payer: PPO | Admitting: Ophthalmology

## 2019-12-04 DIAGNOSIS — M069 Rheumatoid arthritis, unspecified: Secondary | ICD-10-CM

## 2019-12-04 DIAGNOSIS — H43813 Vitreous degeneration, bilateral: Secondary | ICD-10-CM | POA: Diagnosis not present

## 2019-12-04 DIAGNOSIS — H2513 Age-related nuclear cataract, bilateral: Secondary | ICD-10-CM | POA: Diagnosis not present

## 2019-12-04 DIAGNOSIS — H35033 Hypertensive retinopathy, bilateral: Secondary | ICD-10-CM | POA: Diagnosis not present

## 2019-12-04 DIAGNOSIS — I10 Essential (primary) hypertension: Secondary | ICD-10-CM

## 2019-12-10 ENCOUNTER — Ambulatory Visit: Payer: PPO

## 2019-12-11 ENCOUNTER — Other Ambulatory Visit: Payer: Self-pay

## 2019-12-11 ENCOUNTER — Ambulatory Visit (INDEPENDENT_AMBULATORY_CARE_PROVIDER_SITE_OTHER): Payer: PPO | Admitting: Family Medicine

## 2019-12-11 ENCOUNTER — Encounter: Payer: Self-pay | Admitting: Family Medicine

## 2019-12-11 VITALS — BP 110/73 | HR 89 | Temp 98.9°F | Ht 64.0 in | Wt 172.8 lb

## 2019-12-11 DIAGNOSIS — M797 Fibromyalgia: Secondary | ICD-10-CM | POA: Diagnosis not present

## 2019-12-11 DIAGNOSIS — E782 Mixed hyperlipidemia: Secondary | ICD-10-CM

## 2019-12-11 DIAGNOSIS — M069 Rheumatoid arthritis, unspecified: Secondary | ICD-10-CM

## 2019-12-11 DIAGNOSIS — L309 Dermatitis, unspecified: Secondary | ICD-10-CM

## 2019-12-11 DIAGNOSIS — H9190 Unspecified hearing loss, unspecified ear: Secondary | ICD-10-CM | POA: Diagnosis not present

## 2019-12-11 DIAGNOSIS — Z951 Presence of aortocoronary bypass graft: Secondary | ICD-10-CM | POA: Diagnosis not present

## 2019-12-11 DIAGNOSIS — I700 Atherosclerosis of aorta: Secondary | ICD-10-CM

## 2019-12-11 DIAGNOSIS — Z96641 Presence of right artificial hip joint: Secondary | ICD-10-CM

## 2019-12-11 DIAGNOSIS — Z0000 Encounter for general adult medical examination without abnormal findings: Secondary | ICD-10-CM

## 2019-12-11 DIAGNOSIS — Z85528 Personal history of other malignant neoplasm of kidney: Secondary | ICD-10-CM | POA: Diagnosis not present

## 2019-12-11 DIAGNOSIS — Z860100 Personal history of colon polyps, unspecified: Secondary | ICD-10-CM

## 2019-12-11 DIAGNOSIS — K219 Gastro-esophageal reflux disease without esophagitis: Secondary | ICD-10-CM | POA: Diagnosis not present

## 2019-12-11 DIAGNOSIS — M5416 Radiculopathy, lumbar region: Secondary | ICD-10-CM

## 2019-12-11 DIAGNOSIS — E559 Vitamin D deficiency, unspecified: Secondary | ICD-10-CM

## 2019-12-11 DIAGNOSIS — Z1159 Encounter for screening for other viral diseases: Secondary | ICD-10-CM

## 2019-12-11 DIAGNOSIS — Z96651 Presence of right artificial knee joint: Secondary | ICD-10-CM

## 2019-12-11 DIAGNOSIS — Z96653 Presence of artificial knee joint, bilateral: Secondary | ICD-10-CM | POA: Insufficient documentation

## 2019-12-11 DIAGNOSIS — M1611 Unilateral primary osteoarthritis, right hip: Secondary | ICD-10-CM | POA: Diagnosis not present

## 2019-12-11 DIAGNOSIS — Z8669 Personal history of other diseases of the nervous system and sense organs: Secondary | ICD-10-CM

## 2019-12-11 DIAGNOSIS — I10 Essential (primary) hypertension: Secondary | ICD-10-CM

## 2019-12-11 DIAGNOSIS — M549 Dorsalgia, unspecified: Secondary | ICD-10-CM | POA: Diagnosis not present

## 2019-12-11 DIAGNOSIS — J4530 Mild persistent asthma, uncomplicated: Secondary | ICD-10-CM | POA: Diagnosis not present

## 2019-12-11 DIAGNOSIS — G8929 Other chronic pain: Secondary | ICD-10-CM

## 2019-12-11 DIAGNOSIS — M8580 Other specified disorders of bone density and structure, unspecified site: Secondary | ICD-10-CM

## 2019-12-11 NOTE — Progress Notes (Signed)
[name] Long is a 79 y.o. female who presents for annual wellness visit,CPE and follow-up on chronic medical conditions.  She needs a Tdap.  She received a letter from her gastroenterologist indicating no need for further colonoscopy.  She does have underlying reflux disease and does use Nexium on an as-needed basis.  She continues to be followed by pain management by Dr. Hardin Negus for chronic pain.  He is giving her Dilaudid, Soma, Neurontin and butalbital.  She uses the butalbital for her migraine headaches. She has had a right knee replacement as well as a hip replacement.  She has been in rehab for her right knee and is still unable to fully extend the knee.  They have stated that there is no further intervention that they can think of that would benefit.  She will apparently follow-up with cardiology.  She does have underlying rheumatoid arthritis and presently is not taking any medication other than the Neurontin.  She does have hearing loss did have a hearing aid but lost it and is not interested in pursuing that further.  She follows up regularly with cardiology for her underlying heart disease.  Recent CT scan did show evidence of atherosclerosis.  She continues on her statin without difficulty.  She also has a previous history of vitamin D deficiency with recent DEXA scanning showing osteopenia.  She has a previous history of renal cell cancer and did have a recent CT scan that will need to be followed up on she continues on her asthma medications and is having no difficulty with them.  Concerning another lesion in her kidney.  She does have dermatitis on her right thigh and she is using an OTC meds for that. Immunizations and Health Maintenance Immunization History  Administered Date(s) Administered  . Fluad Quad(high Dose 65+) 08/22/2018, 10/13/2019  . Influenza, High Dose Seasonal PF 10/18/2013, 12/01/2016, 10/01/2017  . Influenza,inj,Quad PF,6+ Mos 09/23/2012  . Influenza-Unspecified 12/04/2012,  10/07/2013, 11/02/2013, 09/17/2014, 10/17/2014, 10/22/2015, 12/02/2016  . PFIZER SARS-COV-2 Vaccination 02/06/2019, 03/03/2019, 10/13/2019  . Pneumococcal Conjugate-13 04/08/2015, 02/17/2016  . Pneumococcal Polysaccharide-23 11/19/2002, 09/23/2012, 12/04/2012  . Tdap 07/19/2009  . Zoster 04/16/2008, 03/30/2010  . Zoster Recombinat (Shingrix) 08/09/2016, 11/27/2016   Health Maintenance Due  Topic Date Due  . Hepatitis C Screening  Never done  . COLONOSCOPY  08/06/2018  . TETANUS/TDAP  07/20/2019    Last Pap smear:aged out Last mammogram: 09/30/19 Last colonoscopy: 08/05/13 Last DEXA: 09/30/19 Dentist: Q year Ophtho: Q year Exercise: riding a bike and yard work  Other doctors caring for patient include: Dr.Dalldoef ortho, Dr. Hardin Negus Pain med, Dr. Gwenlyn Found cardio  Advanced directives: Does Patient Have a Medical Advance Directive?: Yes Type of Advance Directive: Home [place] will [place] in Chart?: Yes - validated most recent copy scanned in chart (See row information)  Depression screen:  See questionnaire below.  Depression screen [facility] 2/9 12/11/2019 12/05/2018 10/01/2017 07/31/2016  Decreased Interest 0 0 0 0  Down, Depressed, Hopeless 0 0 0 0  PHQ - 2 Score 0 0 0 0    Fall Risk Screen: see questionnaire below. Fall Risk  12/11/2019 07/29/2019 12/05/2018 10/01/2017 07/31/2016  Falls in the past year? 1 0 1 No No  Comment - Emmi Telephone Survey: data to providers prior to load - - -  Number falls in past yr: 0 - 0 - -  Injury with Fall? 1 - 1 - -  Risk for fall due to : Impaired balance/gait - - - -  ADL screen:  See questionnaire below Functional Status Survey: Is the patient deaf or have difficulty hearing?: No Does the patient have difficulty seeing, even when wearing glasses/contacts?: No Does the patient have difficulty concentrating, remembering, or making decisions?: No Does the patient have difficulty walking or  climbing stairs?: Yes (right knee pain) Does the patient have difficulty dressing or bathing?: No Does the patient have difficulty doing errands alone such as visiting a doctor's office or shopping?: No   Review of Systems Constitutional: -, -unexpected weight change, -anorexia, -fatigue Allergy: -sneezing, -itching, -congestion Dermatology: denies changing moles,  lumps ENT: -runny nose, -ear pain, -sore throat,  Cardiology:  -chest pain, -palpitations, -orthopnea, Respiratory: -cough, -shortness of breath, -dyspnea on exertion, -wheezing,  Gastroenterology: -abdominal pain, -nausea, -vomiting, -diarrhea, -constipation, -dysphagia Hematology: -bleeding or bruising problems Musculoskeletal: -arthralgias, -myalgias, -joint swelling, -back pain, - Ophthalmology: -vision changes,  Urology: -dysuria, -difficulty urinating,  -urinary frequency, -urgency, incontinence Neurology: -, -numbness, , -memory loss, -falls, -dizziness    PHYSICAL EXAM: General Appearance: Alert, cooperative, no distress, appears stated age Head: Normocephalic, without obvious abnormality, atraumatic Eyes: PERRL, conjunctiva/corneas clear, EOM's intact,  Ears: Normal TM's and external ear canals Nose: Nares normal, mucosa normal, no drainage or sinus tenderness Throat: Lips, mucosa, and tongue normal; teeth and gums normal Neck: Supple, no lymphadenopathy;  thyroid:  no enlargement/tenderness/nodules; no carotid bruit or JVD Lungs: Clear to auscultation bilaterally without wheezes, rales or ronchi; respirations unlabored Heart: Regular rate and rhythm, S1 and S2 normal, no murmur, rubor gallop Abdomen: Soft, non-tender, nondistended, normoactive bowel sounds,  no masses, no hepatosplenomegaly Extremities: No clubbing, cyanosis or edema Pulses: 2+ and symmetric all extremities Skin: Dry slightly pigmented 5 cm area noted on right upper thigh. Lymph nodes: Cervical, supraclavicular, and axillary nodes  normal Neurologic:  CNII-XII intact, normal strength, sensation and gait; reflexes 2+ and symmetric throughout Psych: Normal mood, affect, hygiene and grooming.  ASSESSMENT/PLAN: Chronic back pain, unspecified back location, unspecified back pain laterality  Fibromyalgia  Primary localized osteoarthritis of right hip  Mild persistent chronic asthma without complication  Aortic atherosclerosis (HCC)  S/P CABG x 3: (lima-lad,svg-om,svg-rca)  Mixed hyperlipidemia - Plan: Lipid panel  Rheumatoid arthritis of both hips, unspecified whether rheumatoid factor present (HCC)  Gastroesophageal reflux disease without esophagitis  High frequency hearing loss, unspecified laterality  Personal history of colonic polyp - adenoma  History of renal cell cancer - 2020  S/P TKR (total knee replacement), right  History of right hip replacement - 2018  Osteopenia, unspecified location  Essential hypertension  Lumbar radiculopathy, right  Vitamin D deficiency - Plan: VITAMIN D 25 Hydroxy (Vit-D Deficiency, Fractures)  Need for hepatitis C screening test - Plan: Hepatitis C antibody  History of migraine headaches  Dermatitis  She will continue be followed by pain management, cardiology and orthopedics.  Continue on present medications.  All medications were reviewed with her including when and how to take the meds.  Recommend she get the Tdap at the drugstore.  Recommend use of cortisone cream for the lesion on her thigh regularly for at least 3 to 4 weeks.  She will follow up with MRI concerning the renal lesion.  Continue on as needed Nexium.  Follow-up with orthopedics concerning the continued difficulty with the knee. healthy diet, including goals of calcium and vitamin D intake  Immunization recommendations discussed.  Colonoscopy recommendations reviewed   Medicare Attestation I have personally reviewed: The patient's medical and social history Their use of alcohol, tobacco or  illicit  drugs Their current medications and supplements The patient's functional ability including ADLs,fall risks, home safety risks, cognitive, and hearing and visual impairment Diet and physical activities Evidence for depression or mood disorders  The patient's weight, height, and BMI have been recorded in the chart.  I have made referrals, counseling, and provided education to the patient based on review of the above and I have provided the patient with a written personalized care plan for preventive services.     Jill Alexanders, MD   12/11/2019

## 2019-12-11 NOTE — Patient Instructions (Signed)
  [name] Long , Thank you for taking time to come for your Medicare Wellness Visit. I appreciate your ongoing commitment to your health goals. Please review the following plan we discussed and let me know if I can assist you in the future.   These are the goals we discussed: Goals   None     This is a list of the screening recommended for you and due dates:  Health Maintenance  Topic Date Due  .  Hepatitis C: One time screening is recommended by Center for Disease Control  (CDC) for  adults born from [birth year] through 1964.   Never done  . Colon Cancer Screening  08/06/2018  . Tetanus Vaccine  07/20/2019  . COVID-19 Vaccine (4 - Booster for Pfizer series) 04/12/2020  . Flu Shot  Completed  . DEXA scan (bone density measurement)  Completed  . Pneumonia vaccines  Completed

## 2019-12-12 LAB — VITAMIN D 25 HYDROXY (VIT D DEFICIENCY, FRACTURES): Vit D, 25-Hydroxy: 26.9 ng/mL — ABNORMAL LOW (ref 30.0–100.0)

## 2019-12-12 LAB — LIPID PANEL
Chol/HDL Ratio: 1.9 ratio (ref 0.0–4.4)
Cholesterol, Total: 153 mg/dL (ref 100–199)
HDL: 82 mg/dL (ref >39)
LDL Chol Calc (NIH): 56 mg/dL (ref 0–99)
Triglycerides: 80 mg/dL (ref 0–149)
VLDL Cholesterol Cal: 15 mg/dL (ref 5–40)

## 2019-12-12 LAB — HEPATITIS C ANTIBODY: Hep C Virus Ab: <0.1 s/co ratio (ref 0.0–0.9)

## 2020-01-12 DIAGNOSIS — M150 Primary generalized (osteo)arthritis: Secondary | ICD-10-CM | POA: Diagnosis not present

## 2020-01-12 DIAGNOSIS — M47812 Spondylosis without myelopathy or radiculopathy, cervical region: Secondary | ICD-10-CM | POA: Diagnosis not present

## 2020-01-12 DIAGNOSIS — Z79891 Long term (current) use of opiate analgesic: Secondary | ICD-10-CM | POA: Diagnosis not present

## 2020-01-12 DIAGNOSIS — G894 Chronic pain syndrome: Secondary | ICD-10-CM | POA: Diagnosis not present

## 2020-01-15 ENCOUNTER — Other Ambulatory Visit: Payer: Self-pay | Admitting: Family Medicine

## 2020-01-15 DIAGNOSIS — E785 Hyperlipidemia, unspecified: Secondary | ICD-10-CM

## 2020-01-15 DIAGNOSIS — J4530 Mild persistent asthma, uncomplicated: Secondary | ICD-10-CM

## 2020-01-22 DIAGNOSIS — Z471 Aftercare following joint replacement surgery: Secondary | ICD-10-CM | POA: Diagnosis not present

## 2020-01-22 DIAGNOSIS — Z96651 Presence of right artificial knee joint: Secondary | ICD-10-CM | POA: Diagnosis not present

## 2020-02-03 ENCOUNTER — Other Ambulatory Visit (HOSPITAL_COMMUNITY): Payer: Self-pay | Admitting: Urology

## 2020-02-03 DIAGNOSIS — C641 Malignant neoplasm of right kidney, except renal pelvis: Secondary | ICD-10-CM

## 2020-02-07 ENCOUNTER — Ambulatory Visit (HOSPITAL_COMMUNITY)
Admission: RE | Admit: 2020-02-07 | Discharge: 2020-02-07 | Disposition: A | Discharge status: Home / Self Care | Payer: PPO | Source: Ambulatory Visit | Attending: Urology | Admitting: Urology

## 2020-02-07 DIAGNOSIS — K838 Other specified diseases of biliary tract: Secondary | ICD-10-CM | POA: Diagnosis not present

## 2020-02-07 DIAGNOSIS — C641 Malignant neoplasm of right kidney, except renal pelvis: Secondary | ICD-10-CM | POA: Diagnosis not present

## 2020-02-07 DIAGNOSIS — K8689 Other specified diseases of pancreas: Secondary | ICD-10-CM | POA: Diagnosis not present

## 2020-02-07 DIAGNOSIS — I700 Atherosclerosis of aorta: Secondary | ICD-10-CM | POA: Diagnosis not present

## 2020-02-07 DIAGNOSIS — Z905 Acquired absence of kidney: Secondary | ICD-10-CM | POA: Diagnosis not present

## 2020-02-07 MED ORDER — GADOBUTROL 1 MMOL/ML IV SOLN
7.5000 mL | Freq: Once | INTRAVENOUS | Status: AC | PRN
  Administered 2020-02-07: 7.5 mL via INTRAVENOUS

## 2020-02-11 DIAGNOSIS — C641 Malignant neoplasm of right kidney, except renal pelvis: Secondary | ICD-10-CM | POA: Diagnosis not present

## 2020-03-13 NOTE — Addendum Note (Signed)
Encounter addended by: Annie Paras on: 03/13/2020 1:33 PM  Actions taken: Letter saved

## 2020-03-15 DIAGNOSIS — R928 Other abnormal and inconclusive findings on diagnostic imaging of breast: Secondary | ICD-10-CM | POA: Diagnosis not present

## 2020-03-15 LAB — HM MAMMOGRAPHY

## 2020-03-18 ENCOUNTER — Other Ambulatory Visit: Payer: Self-pay

## 2020-03-18 ENCOUNTER — Ambulatory Visit (INDEPENDENT_AMBULATORY_CARE_PROVIDER_SITE_OTHER): Payer: PPO | Admitting: Family Medicine

## 2020-03-18 VITALS — BP 126/76 | HR 90 | Temp 98.4°F | Wt 178.4 lb

## 2020-03-18 DIAGNOSIS — R935 Abnormal findings on diagnostic imaging of other abdominal regions, including retroperitoneum: Secondary | ICD-10-CM | POA: Diagnosis not present

## 2020-03-18 DIAGNOSIS — I700 Atherosclerosis of aorta: Secondary | ICD-10-CM | POA: Diagnosis not present

## 2020-03-18 DIAGNOSIS — Z85528 Personal history of other malignant neoplasm of kidney: Secondary | ICD-10-CM

## 2020-03-18 NOTE — Progress Notes (Signed)
   Subjective:    Patient ID: [name] Long, female    DOB: 1940-04-16, 80 y.o.   MRN: [id number]  HPI She is here for consult concerning a recent MRI which did show an incidental finding of 8 to 9 mm lesion in the area of her previous surgery for renal cell cancer.  This was discussed with her urologist.  His recommendation was to wait another year and do something more definitive at that point.  She received a letter from radiology concerning discussing this further.   Review of Systems     Objective:   Physical Exam Alert and in no distress.  The MRI was reviewed.       Assessment & Plan:  Abnormal MRI of abdomen  History of renal cell cancer  Aortic atherosclerosis (HCC) I reviewed the MRI with her and the incidental finding as well as the comments from Dr. [name].  I concur with Dr. [name] that I think this is fine waiting another year.  She was comfortable with that.

## 2020-03-23 ENCOUNTER — Encounter: Payer: Self-pay | Admitting: Family Medicine

## 2020-04-12 DIAGNOSIS — M47812 Spondylosis without myelopathy or radiculopathy, cervical region: Secondary | ICD-10-CM | POA: Diagnosis not present

## 2020-04-12 DIAGNOSIS — Z79891 Long term (current) use of opiate analgesic: Secondary | ICD-10-CM | POA: Diagnosis not present

## 2020-04-12 DIAGNOSIS — M150 Primary generalized (osteo)arthritis: Secondary | ICD-10-CM | POA: Diagnosis not present

## 2020-04-12 DIAGNOSIS — G894 Chronic pain syndrome: Secondary | ICD-10-CM | POA: Diagnosis not present

## 2020-04-28 ENCOUNTER — Ambulatory Visit: Payer: PPO

## 2020-04-28 ENCOUNTER — Other Ambulatory Visit: Payer: Self-pay

## 2020-05-03 ENCOUNTER — Other Ambulatory Visit: Payer: Self-pay

## 2020-05-03 ENCOUNTER — Encounter: Payer: Self-pay | Admitting: Family Medicine

## 2020-05-03 ENCOUNTER — Ambulatory Visit (INDEPENDENT_AMBULATORY_CARE_PROVIDER_SITE_OTHER): Payer: PPO | Admitting: Family Medicine

## 2020-05-03 VITALS — BP 132/84 | HR 75 | Ht 64.0 in | Wt 180.2 lb

## 2020-05-03 DIAGNOSIS — Z951 Presence of aortocoronary bypass graft: Secondary | ICD-10-CM | POA: Diagnosis not present

## 2020-05-03 DIAGNOSIS — J4530 Mild persistent asthma, uncomplicated: Secondary | ICD-10-CM

## 2020-05-03 DIAGNOSIS — R0600 Dyspnea, unspecified: Secondary | ICD-10-CM | POA: Diagnosis not present

## 2020-05-03 DIAGNOSIS — J309 Allergic rhinitis, unspecified: Secondary | ICD-10-CM | POA: Diagnosis not present

## 2020-05-03 DIAGNOSIS — R0609 Other forms of dyspnea: Secondary | ICD-10-CM

## 2020-05-03 DIAGNOSIS — R7309 Other abnormal glucose: Secondary | ICD-10-CM | POA: Diagnosis not present

## 2020-05-03 LAB — CBC WITH DIFFERENTIAL/PLATELET
Basophils Absolute: 0 10*3/uL (ref 0.0–0.2)
Basos: 1 %
EOS (ABSOLUTE): 0.2 10*3/uL (ref 0.0–0.4)
Eos: 3 %
Hematocrit: 41.4 % (ref 34.0–46.6)
Hemoglobin: 13.2 g/dL (ref 11.1–15.9)
Immature Grans (Abs): 0 10*3/uL (ref 0.0–0.1)
Immature Granulocytes: 0 %
Lymphocytes Absolute: 1.4 10*3/uL (ref 0.7–3.1)
Lymphs: 25 %
MCH: 28.9 pg (ref 26.6–33.0)
MCHC: 31.9 g/dL (ref 31.5–35.7)
MCV: 91 fL (ref 79–97)
Monocytes Absolute: 0.4 10*3/uL (ref 0.1–0.9)
Monocytes: 6 %
Neutrophils Absolute: 3.7 10*3/uL (ref 1.4–7.0)
Neutrophils: 65 %
Platelets: 178 10*3/uL (ref 150–450)
RBC: 4.57 x10E6/uL (ref 3.77–5.28)
RDW: 13 % (ref 11.7–15.4)
WBC: 5.7 10*3/uL (ref 3.4–10.8)

## 2020-05-03 LAB — COMPREHENSIVE METABOLIC PANEL
ALT: 10 IU/L (ref 0–32)
AST: 20 IU/L (ref 0–40)
Albumin/Globulin Ratio: 1.7 (ref 1.2–2.2)
Albumin: 4.6 g/dL (ref 3.7–4.7)
Alkaline Phosphatase: 153 IU/L — ABNORMAL HIGH (ref 44–121)
BUN/Creatinine Ratio: 27 (ref 12–28)
BUN: 25 mg/dL (ref 8–27)
Bilirubin Total: 0.3 mg/dL (ref 0.0–1.2)
CO2: 25 mmol/L (ref 20–29)
Calcium: 9.5 mg/dL (ref 8.7–10.3)
Chloride: 102 mmol/L (ref 96–106)
Creatinine, Ser: 0.91 mg/dL (ref 0.57–1.00)
Globulin, Total: 2.7 g/dL (ref 1.5–4.5)
Glucose: 131 mg/dL — ABNORMAL HIGH (ref 65–99)
Potassium: 4.5 mmol/L (ref 3.5–5.2)
Sodium: 142 mmol/L (ref 134–144)
Total Protein: 7.3 g/dL (ref 6.0–8.5)
eGFR: 65 mL/min/{1.73_m2} (ref >59)

## 2020-05-03 LAB — LIPID PANEL
Chol/HDL Ratio: 2.1 ratio (ref 0.0–4.4)
Cholesterol, Total: 170 mg/dL (ref 100–199)
HDL: 82 mg/dL (ref >39)
LDL Chol Calc (NIH): 75 mg/dL (ref 0–99)
Triglycerides: 70 mg/dL (ref 0–149)
VLDL Cholesterol Cal: 13 mg/dL (ref 5–40)

## 2020-05-03 NOTE — Progress Notes (Signed)
   Subjective:    Patient ID: [name] Long, female    DOB: 1940-02-07, 80 y.o.   MRN: [id number]  HPI She complains of worsening dyspnea on exertion over the last several months.  She has been relatively sedentary because of knee problems but has noted the above symptoms that lasts roughly a minute or 2.  She also has an underlying history of allergies and asthma with sneezing, itchy watery eyes and wheezing.  She continues on an unknown antihistamine as well as Flonase, Singulair.  She also is using Advair 250/50 and albuterol usually twice per week.  She describes symptoms of unable to catch her breath and notes a lot of postnasal drainage but tends to go away quickly but is not associated with physical activity.  She states that she does have a cardiology appointment at the end of the month.   Review of Systems     Objective:   Physical Exam Alert and in no distress. Tympanic membranes and canals are normal. Pharyngeal area is normal. Neck is supple without adenopathy or thyromegaly. Cardiac exam shows a regular sinus rhythm without murmurs or gallops. Lungs are clear to auscultation. EKG read by me shows a sinus rhythm with essentially no change from previous reading.       Assessment & Plan:  S/P CABG x 3: (lima-lad,svg-om,svg-rca) - Plan: EKG 12-Lead, Ambulatory referral to Cardiology, CBC with Differential/Platelet, Comprehensive metabolic panel, Lipid panel  Mild persistent chronic asthma without complication  Allergic rhinitis, unspecified seasonality, unspecified trigger  Dyspnea on exertion - Plan: EKG 12-Lead, Ambulatory referral to Cardiology, CBC with Differential/Platelet, Comprehensive metabolic panel, Lipid panel Although she has been relatively sedentary I think we need to make sure this is not cardiac origin.

## 2020-05-03 NOTE — Patient Instructions (Signed)
Check out the generic name of your antihistamine and switch to a different brand.  Keep using the allergy spray

## 2020-05-04 ENCOUNTER — Encounter: Payer: Self-pay | Admitting: Family Medicine

## 2020-05-04 LAB — SPECIMEN STATUS REPORT

## 2020-05-04 LAB — HGB A1C W/O EAG: Hgb A1c MFr Bld: 6.1 % — ABNORMAL HIGH (ref 4.8–5.6)

## 2020-05-24 ENCOUNTER — Telehealth: Payer: Self-pay

## 2020-05-24 NOTE — Telephone Encounter (Signed)
Pt called this am and sound bad. Pt thinks she may have pneumonia again. Cough and no fever . Please advise [facility]

## 2020-05-25 ENCOUNTER — Other Ambulatory Visit: Payer: Self-pay

## 2020-05-25 ENCOUNTER — Emergency Department (HOSPITAL_BASED_OUTPATIENT_CLINIC_OR_DEPARTMENT_OTHER)
Admission: EM | Admit: 2020-05-25 | Discharge: 2020-05-25 | Disposition: A | Discharge status: Home / Self Care | Payer: PPO | Attending: Emergency Medicine | Admitting: Emergency Medicine

## 2020-05-25 ENCOUNTER — Telehealth: Payer: Self-pay

## 2020-05-25 ENCOUNTER — Emergency Department (HOSPITAL_BASED_OUTPATIENT_CLINIC_OR_DEPARTMENT_OTHER): Payer: PPO | Admitting: Radiology

## 2020-05-25 ENCOUNTER — Encounter (HOSPITAL_BASED_OUTPATIENT_CLINIC_OR_DEPARTMENT_OTHER): Payer: Self-pay | Admitting: *Deleted

## 2020-05-25 DIAGNOSIS — J45909 Unspecified asthma, uncomplicated: Secondary | ICD-10-CM | POA: Diagnosis not present

## 2020-05-25 DIAGNOSIS — R000 Tachycardia, unspecified: Secondary | ICD-10-CM | POA: Insufficient documentation

## 2020-05-25 DIAGNOSIS — J40 Bronchitis, not specified as acute or chronic: Secondary | ICD-10-CM

## 2020-05-25 DIAGNOSIS — Z20822 Contact with and (suspected) exposure to covid-19: Secondary | ICD-10-CM | POA: Diagnosis not present

## 2020-05-25 DIAGNOSIS — I2510 Atherosclerotic heart disease of native coronary artery without angina pectoris: Secondary | ICD-10-CM | POA: Insufficient documentation

## 2020-05-25 DIAGNOSIS — R6883 Chills (without fever): Secondary | ICD-10-CM | POA: Diagnosis not present

## 2020-05-25 DIAGNOSIS — M549 Dorsalgia, unspecified: Secondary | ICD-10-CM | POA: Diagnosis not present

## 2020-05-25 DIAGNOSIS — R0981 Nasal congestion: Secondary | ICD-10-CM | POA: Insufficient documentation

## 2020-05-25 DIAGNOSIS — Z87891 Personal history of nicotine dependence: Secondary | ICD-10-CM | POA: Diagnosis not present

## 2020-05-25 DIAGNOSIS — Z951 Presence of aortocoronary bypass graft: Secondary | ICD-10-CM | POA: Insufficient documentation

## 2020-05-25 DIAGNOSIS — Z96641 Presence of right artificial hip joint: Secondary | ICD-10-CM | POA: Insufficient documentation

## 2020-05-25 DIAGNOSIS — Z96651 Presence of right artificial knee joint: Secondary | ICD-10-CM | POA: Diagnosis not present

## 2020-05-25 DIAGNOSIS — R079 Chest pain, unspecified: Secondary | ICD-10-CM | POA: Diagnosis not present

## 2020-05-25 DIAGNOSIS — Z86010 Personal history of colonic polyps: Secondary | ICD-10-CM | POA: Insufficient documentation

## 2020-05-25 DIAGNOSIS — R059 Cough, unspecified: Secondary | ICD-10-CM | POA: Insufficient documentation

## 2020-05-25 LAB — CBC WITH DIFFERENTIAL/PLATELET
Abs Immature Granulocytes: 0.02 10*3/uL (ref 0.00–0.07)
Basophils Absolute: 0 10*3/uL (ref 0.0–0.1)
Basophils Relative: 0 %
Eosinophils Absolute: 0.1 10*3/uL (ref 0.0–0.5)
Eosinophils Relative: 2 %
HCT: 39.8 % (ref 36.0–46.0)
Hemoglobin: 12.6 g/dL (ref 12.0–15.0)
Immature Granulocytes: 0 %
Lymphocytes Relative: 19 %
Lymphs Abs: 0.9 10*3/uL (ref 0.7–4.0)
MCH: 29.1 pg (ref 26.0–34.0)
MCHC: 31.7 g/dL (ref 30.0–36.0)
MCV: 91.9 fL (ref 80.0–100.0)
Monocytes Absolute: 0.4 10*3/uL (ref 0.1–1.0)
Monocytes Relative: 9 %
Neutro Abs: 3.5 10*3/uL (ref 1.7–7.7)
Neutrophils Relative %: 70 %
Platelets: 151 10*3/uL (ref 150–400)
RBC: 4.33 MIL/uL (ref 3.87–5.11)
RDW: 13.8 % (ref 11.5–15.5)
WBC: 4.9 10*3/uL (ref 4.0–10.5)
nRBC: 0 % (ref 0.0–0.2)

## 2020-05-25 LAB — BASIC METABOLIC PANEL
Anion gap: 10 (ref 5–15)
BUN: 16 mg/dL (ref 8–23)
CO2: 25 mmol/L (ref 22–32)
Calcium: 8.5 mg/dL — ABNORMAL LOW (ref 8.9–10.3)
Chloride: 103 mmol/L (ref 98–111)
Creatinine, Ser: 0.78 mg/dL (ref 0.44–1.00)
GFR, Estimated: >60 mL/min (ref >60)
Glucose, Bld: 108 mg/dL — ABNORMAL HIGH (ref 70–99)
Potassium: 4.1 mmol/L (ref 3.5–5.1)
Sodium: 138 mmol/L (ref 135–145)

## 2020-05-25 LAB — RESP PANEL BY RT-PCR (FLU A&B, COVID) ARPGX2
Influenza A by PCR: NEGATIVE
Influenza B by PCR: NEGATIVE
SARS Coronavirus 2 by RT PCR: NEGATIVE

## 2020-05-25 MED ORDER — IBUPROFEN 800 MG PO TABS
800.0000 mg | ORAL_TABLET | Freq: Once | ORAL | Status: AC
  Administered 2020-05-25: 800 mg via ORAL
  Filled 2020-05-25: qty 1

## 2020-05-25 MED ORDER — BENZONATATE 100 MG PO CAPS: 100.0000 mg | ORAL_CAPSULE | Freq: Two times a day (BID) | ORAL | 0 refills | Status: AC | PRN

## 2020-05-25 MED ORDER — SODIUM CHLORIDE 0.9 % IV BOLUS
500.0000 mL | Freq: Once | INTRAVENOUS | Status: AC
  Administered 2020-05-25: 500 mL via INTRAVENOUS

## 2020-05-25 MED ORDER — ALBUTEROL SULFATE HFA 108 (90 BASE) MCG/ACT IN AERS
2.0000 | INHALATION_SPRAY | Freq: Once | RESPIRATORY_TRACT | Status: AC
  Administered 2020-05-25: 2 via RESPIRATORY_TRACT
  Filled 2020-05-25: qty 6.7

## 2020-05-25 MED ORDER — PREDNISONE 20 MG PO TABS: 20.0000 mg | ORAL_TABLET | Freq: Every day | ORAL | 0 refills | Status: AC

## 2020-05-25 MED ORDER — DOXYCYCLINE HYCLATE 100 MG PO CAPS: 100.0000 mg | ORAL_CAPSULE | Freq: Two times a day (BID) | ORAL | 0 refills | Status: DC

## 2020-05-25 MED ORDER — METHYLPREDNISOLONE SODIUM SUCC 125 MG IJ SOLR
125.0000 mg | Freq: Once | INTRAMUSCULAR | Status: AC
  Administered 2020-05-25: 125 mg via INTRAVENOUS
  Filled 2020-05-25: qty 2

## 2020-05-25 NOTE — Telephone Encounter (Signed)
Screen her for COVID and if negative, then I will see her.

## 2020-05-25 NOTE — Discharge Instructions (Signed)
You have been seen and discharged from the emergency department.  I believe you are suffering with bronchitis.  Continue to use your inhalers.  Take antibiotic and steroids as prescribed.  Use cough suppressant medication at night as needed for sleeping.  Follow-up with your primary provider for reevaluation and further care. Take home medications as prescribed. If you have any worsening symptoms or further concerns for your health please return to an emergency department for further evaluation.

## 2020-05-25 NOTE — ED Notes (Signed)
Patient transported to X-ray 

## 2020-05-25 NOTE — ED Triage Notes (Signed)
Chest pain and back pain when she coughs for 3 days.

## 2020-05-25 NOTE — Telephone Encounter (Signed)
LVM for pt to call back to get her on the schedule . Pt must be covid tested before she can com in the Building. [place]

## 2020-05-25 NOTE — ED Notes (Signed)
Patient verbalizes understanding of discharge instructions. Opportunity for questioning and answers were provided. Armband removed by staff, pt discharged from ED. Pt. ambulatory and discharged home.  

## 2020-05-25 NOTE — ED Provider Notes (Signed)
[facility] EMERGENCY DEPT Provider Note   CSN: [id number] Arrival date & time: 05/25/20  1101     History Chief Complaint  Patient presents with  . Cough  . Back Pain  . Chest Pain    [name] Long is a 80 y.o. female.  HPI   80 year old female presents the emergency department with cough and chills for the past 3 days.  Patient states she has had a nonproductive cough with upper chest congestion.  She been taking Mucinex without significant relief.  She has been having chills at home but no documented fever.  She developed right upper back pain when she coughs.  She denies any hemoptysis, no chest pain at rest.  Denies any swelling of her lower extremities.  Past Medical History:  Diagnosis Date  . Abnormal EKG 01/12/2011  . Allergy    RHINITIS  . Arthritis    neck, back knees, hips  . Asthma   . Asthma 01/12/2011  . Back pain, chronic--plans were for cervical disc surgery week of 01/16/11 01/12/2011  . Blood clot in vein 2004   L leg  . Blood transfusion without reported diagnosis 2004  . Chronic low back pain   . Complication of anesthesia    post op bladder retention after surgery  . Connective tissue disease ([place])   . Coronary artery disease   . Crescendo angina, with EKG changes, new. 01/12/2011  . Dyspnea   . Fibromyalgia   . Fibromyalgia   . GERD (gastroesophageal reflux disease)   . Headache    migraine - several in a yr.   . History of stress test 04/2011   Normal stress test  . Hx of echocardiogram 07/19/2011   EF 50-55%. There is mild annular calcification, there is trace migtral regurgitation, there is trace tricupid regurgitation, pericardium is thick, there is no pericardial effusion.  . Hypertension   . Myocardial infarction [facility]) 2013   triple heart bypass  . Neuromuscular disorder (HCC)    fibromyalgia   . Osteoporosis    OSTEOPENIA  . Personal history of colonic polyp - adenoma 08/05/2013   08/2013 - 7 mm adenoma - consider repeat  colonoscopy 2020 (age 53)  . Pneumonia 2018   numerous times, only admitted one time for pneumonia but she has had pneumonia numerous times;   02-19-2018 END OF NOVEMBER 2019  WAS ADMITTED FOR ABOUT  A WEEK DUE TO "DOUBLE PNEUMONIA", REPORTS TODAY FEELING BACK TO HER NORMAL , DENIES SOB, O2 SAT AT 94 ROOM AIR , AFEBRILE, OTHER VSS   . PONV (postoperative nausea and vomiting)   . PPD positive   . Status post coronary artery bypass grafting   . Urinary incontinence    post op, "sleepy bladder", urinar retention     Patient Active Problem List   Diagnosis Date Noted  . Allergic rhinitis 05/03/2020  . Aortic atherosclerosis ([place]) 12/11/2019  . S/P TKR (total knee replacement), right 12/11/2019  . History of right hip replacement 12/11/2019  . History of migraine headaches 12/11/2019  . History of renal cell cancer 03/13/2018  . Vitamin D deficiency 07/23/2017  . Osteopenia 07/23/2017  . Essential hypertension 05/15/2017  . Primary localized osteoarthritis of right hip 08/22/2016  . Lumbar radiculopathy, right 03/06/2016  . Personal history of colonic polyp - adenoma 08/05/2013  . GERD (gastroesophageal reflux disease) 03/11/2013  . Rheumatoid arthritis ([place]) 03/11/2013  . High frequency hearing loss 03/11/2013  . Tinnitus 03/11/2013  . Hyperlipidemia 08/21/2012  . S/P  CABG x 3: (lima-lad,svg-om,svg-rca) 01/16/2011  . CAD (coronary artery disease): multivessel, good LVF 50% at cath 01/16/2011  . Back pain, chronic--plans were for cervical disc surgery week of 01/16/11 01/12/2011  . Asthma, chronic 01/12/2011  . Fibromyalgia 01/12/2011    Past Surgical History:  Procedure Laterality Date  . ABDOMINAL HYSTERECTOMY  1981  . APPENDECTOMY  1946  . BIOPSY BREAST Left 03/27/2019   Benign fibroaipose tissue no evidence of malignancy  . BLADDER REPAIR  1985  . BREAST SURGERY  1992   REDUCTION, x3 surgeries overall on breast for augmentation   . CARDIAC CATHETERIZATION  2013  . CARPAL  TUNNEL RELEASE Bilateral 1988, 1999  . [place]  . CORONARY ARTERY BYPASS GRAFT  01/14/2011   Procedure: CORONARY ARTERY BYPASS GRAFTING (CABG);  Surgeon: Tharon Aquas Adelene Idler, MD;  Location: [facility];  Service: Open Heart Surgery;  Laterality: N/A;  CABG x three, using right greater saphenous vein harvested endoscopically  . EYE SURGERY Bilateral 1985   radiokerototomy  . LEFT HEART CATHETERIZATION WITH CORONARY ANGIOGRAM N/A 01/13/2011   Procedure: LEFT HEART CATHETERIZATION WITH CORONARY ANGIOGRAM;  Surgeon: Troy Sine, MD;  Location: [facility] C[facility] LAB;  Service: Cardiovascular;  Laterality: N/A;  . PUBOVAGINAL SLING  2002  . ROBOT ASSISTED LAPAROSCOPIC NEPHRECTOMY Right 02/27/2018   Procedure: XI ROBOTIC ASSISTE PARTIAL NEPHRECTOMY;  Surgeon: Ceasar Mons, MD;  Location: [facility];  Service: Urology;  Laterality: Right;  . STERNAL WOUND DEBRIDEMENT  07/27/2011   Procedure: STERNAL WOUND DEBRIDEMENT;  followed by wound vac Surgeon: Ivin Poot, MD;  Location: [facility];  Service: Open Heart Surgery;  Laterality: N/A;  . TONSILLECTOMY  1952  . TOTAL HIP ARTHROPLASTY Right 08/22/2016   Procedure: TOTAL HIP ARTHROPLASTY ANTERIOR APPROACH;  Surgeon: Melrose Nakayama, MD;  Location: [facility];  Service: Orthopedics;  Laterality: Right;  . TOTAL KNEE ARTHROPLASTY Right 07/22/2019   Procedure: RIGHT TOTAL KNEE ARTHROPLASTY;  Surgeon: Melrose Nakayama, MD;  Location: [facility];  Service: Orthopedics;  Laterality: Right;  . TRIGGER FINGER RELEASE Right 2014   4th finger  . TROCHANTERIC BURSA EXCISION Right    bursa removed, R hip  . TUBAL LIGATION       OB History   No obstetric history on file.     Family History  Adopted: Yes  Problem Relation Age of Onset  . Hypertension Brother     Social History   Tobacco Use  . Smoking status: Former Smoker    Quit date: 01/12/1980    Years since quitting: 40.3  . Smokeless tobacco: Never Used  Vaping Use  . Vaping Use: Never  used  Substance Use Topics  . Alcohol use: No    Alcohol/week: 0.0 standard drinks  . Drug use: No    Home Medications   Allergies    Dimenhydrinate, Morphine and related, Percocet [oxycodone-acetaminophen], and Zolpidem  Review of Systems   Review of Systems  Constitutional: Positive for chills. Negative for fever.  HENT: Negative for congestion.   Eyes: Negative for visual disturbance.  Respiratory: Positive for cough. Negative for shortness of breath.   Cardiovascular: Negative for chest pain, palpitations and leg swelling.  Gastrointestinal: Negative for abdominal pain, diarrhea and vomiting.  Genitourinary: Negative for dysuria.  Musculoskeletal: Positive for back pain.  Skin: Negative for rash.  Neurological: Negative for headaches.    Physical Exam Updated Vital Signs BP (!) 121/95   Pulse (!) 108   Temp (!) 100.4  F (38 C) (Oral)   Resp 20   Ht 5' 4.5" (1.638 m)   Wt 77.1 kg   SpO2 100%   BMI 28.73 kg/m   Physical Exam Vitals and nursing note reviewed.  Constitutional:      Appearance: Normal appearance. She is not diaphoretic.  HENT:     Head: Normocephalic.     Mouth/Throat:     Mouth: Mucous membranes are moist.  Cardiovascular:     Rate and Rhythm: Tachycardia present.  Pulmonary:     Effort: Pulmonary effort is normal. No respiratory distress.     Breath sounds: Examination of the left-middle field reveals rales. Examination of the right-lower field reveals decreased breath sounds and rales. Examination of the left-lower field reveals decreased breath sounds and rales. Decreased breath sounds, wheezing and rales present.  Abdominal:     Palpations: Abdomen is soft.     Tenderness: There is no abdominal tenderness.  Musculoskeletal:     Right lower leg: No edema.     Left lower leg: No edema.  Skin:    General: Skin is warm.  Neurological:     Mental Status: She is alert and oriented to person, place, and time. Mental status is at baseline.   Psychiatric:        Mood and Affect: Mood normal.     ED Results / Procedures / Treatments   Labs (all labs ordered are listed, but only abnormal results are displayed) Labs Reviewed  RESP PANEL BY RT-PCR (FLU A&B, COVID) ARPGX2  CBC WITH DIFFERENTIAL/PLATELET  BASIC METABOLIC PANEL    EKG EKG Interpretation  Date/Time:  Tuesday May 25 2020 11:10:18 EDT Ventricular Rate:  106 PR Interval:  148 QRS Duration: 90 QT Interval:  302 QTC Calculation: 401 R Axis:   42 Text Interpretation: Sinus tachycardia Consider right atrial enlargement Abnormal R-wave progression, early transition Abnormal T, consider ischemia, diffuse leads Sinus tachycardia, ST changes are old Confirmed by Lavenia Atlas [phone]) on 05/25/2020 11:19:58 AM   Radiology No results found.  Procedures Procedures   Medications Ordered in ED Medications  albuterol (VENTOLIN HFA) 108 (90 Base) MCG/ACT inhaler 2 puff (2 puffs Inhalation Given 05/25/20 1203)  methylPREDNISolone sodium succinate (SOLU-MEDROL) 125 mg/2 mL injection 125 mg (125 mg Intravenous Given 05/25/20 1154)  ibuprofen (ADVIL) tablet 800 mg (800 mg Oral Given 05/25/20 1154)    ED Course  I have reviewed the triage vital signs and the nursing notes.  Pertinent labs & imaging results that were available during my care of the patient were reviewed by me and considered in my medical decision making (see chart for details).    MDM Rules/Calculators/A&P                          80 year old female presents emergency department with chills and nonproductive cough.  She is febrile on arrival, tachycardic.  She has rales and wheezes on lung auscultation with a cough.  She appears infectious.  Will work-up and treat symptomatically.  Low suspicion for PE given the fever.  EKG shows sinus tachycardia with old ST changes.  Blood work is reassuring, after breathing treatment, steroids and Tylenol patient feels improved.  Chest x-ray shows no pneumonia but  inflammatory changes and pneumonitis.  Patient has history of asthma/COPD, I believe she is suffering from bronchitis.  Will treat with antibiotics, steroids and cough suppressant.  On reevaluation tachycardia has resolved, wheezing has significantly improved although she continues to  cough.  Patient will be discharged and treated as an outpatient.  Discharge plan and strict return to ED precautions discussed, patient verbalizes understanding and agreement.  Final Clinical Impression(s) / ED Diagnoses Final diagnoses:  None    Rx / DC Orders ED Discharge Orders    None       Lorelle Gibbs, DO 05/25/20 1403

## 2020-05-25 NOTE — Telephone Encounter (Signed)
LVM  for pt to call back to get her on the schedule . Pt must be covid tested before she can com in the Building.

## 2020-05-26 ENCOUNTER — Ambulatory Visit: Payer: PPO | Admitting: Cardiovascular Disease

## 2020-05-26 NOTE — Telephone Encounter (Signed)
Pt went to the hospital.

## 2020-06-02 ENCOUNTER — Other Ambulatory Visit: Payer: Self-pay | Admitting: Cardiovascular Disease

## 2020-06-02 MED ORDER — METOPROLOL TARTRATE 25 MG PO TABS: 25.0000 mg | ORAL_TABLET | Freq: Two times a day (BID) | ORAL | 0 refills | Status: DC

## 2020-06-02 NOTE — Telephone Encounter (Signed)
Metoprolol tartrate refilled.

## 2020-07-06 DIAGNOSIS — M47812 Spondylosis without myelopathy or radiculopathy, cervical region: Secondary | ICD-10-CM | POA: Diagnosis not present

## 2020-07-06 DIAGNOSIS — M150 Primary generalized (osteo)arthritis: Secondary | ICD-10-CM | POA: Diagnosis not present

## 2020-07-06 DIAGNOSIS — Z79891 Long term (current) use of opiate analgesic: Secondary | ICD-10-CM | POA: Diagnosis not present

## 2020-07-06 DIAGNOSIS — G894 Chronic pain syndrome: Secondary | ICD-10-CM | POA: Diagnosis not present

## 2020-07-13 ENCOUNTER — Other Ambulatory Visit: Payer: Self-pay | Admitting: Family Medicine

## 2020-07-13 DIAGNOSIS — J4530 Mild persistent asthma, uncomplicated: Secondary | ICD-10-CM

## 2020-08-11 ENCOUNTER — Ambulatory Visit: Payer: PPO | Admitting: Cardiovascular Disease

## 2020-08-11 ENCOUNTER — Encounter: Payer: Self-pay | Admitting: Cardiovascular Disease

## 2020-08-11 ENCOUNTER — Other Ambulatory Visit: Payer: Self-pay

## 2020-08-11 DIAGNOSIS — Z951 Presence of aortocoronary bypass graft: Secondary | ICD-10-CM

## 2020-08-11 DIAGNOSIS — E782 Mixed hyperlipidemia: Secondary | ICD-10-CM | POA: Diagnosis not present

## 2020-08-11 DIAGNOSIS — I10 Essential (primary) hypertension: Secondary | ICD-10-CM

## 2020-08-11 NOTE — Patient Instructions (Signed)
Medication Instructions:  The current medical regimen is effective;  continue present plan and medications.  *If you need a refill on your cardiac medications before your next appointment, please call your pharmacy*  Testing/Procedures: Echocardiogram - Your physician has requested that you have an echocardiogram. Echocardiography is a painless test that uses sound waves to create images of your heart. It provides your doctor with information about the size and shape of your heart and how well your heart's chambers and valves are working. This procedure takes approximately one hour. There are no restrictions for this procedure. This will be performed at our [address] location - [address], [address].    Follow-Up: At [facility], you and your health needs are our priority.  As part of our continuing mission to provide you with exceptional heart care, we have created designated Provider Care Teams.  These Care Teams include your primary Cardiologist (physician) and Advanced Practice Providers (APPs -  Physician Assistants and Nurse Practitioners) who all work together to provide you with the care you need, when you need it.  We recommend signing up for the patient portal called "MyChart".  Sign up information is provided on this After Visit Summary.  MyChart is used to connect with patients for Virtual Visits (Telemedicine).  Patients are able to view lab/test results, encounter notes, upcoming appointments, etc.  Non-urgent messages can be sent to your provider as well.   To learn more about what you can do with MyChart, go to [email or web address].    Your next appointment:   12 month(s)  The format for your next appointment:   In Person  Provider:   Jonathan Berry, MD     

## 2020-08-11 NOTE — Assessment & Plan Note (Signed)
History of CAD status post coronary artery bypass grafting by Dr. Dahlia Byes January 2013 for left main three-vessel disease.  She had a LIMA to her LAD, vein to arced obtuse marginal branch and to the right coronary artery.  Myoview performed 12/28/2015 was unremarkable.  Since I saw her a year ago she has complained of some dyspnea but denies chest pain.  I am going to check a 2D echocardiogram.

## 2020-08-11 NOTE — Progress Notes (Signed)
08/11/2020 [name] Long   05/23/1940  [id number]  Primary Physician Denita Lung, MD Primary Cardiologist: Lorretta Harp MD FACP, [place], [place], [place]  HPI:  [name] Long is a 80 y.o.  mildly overweight married Caucasian female, mother of 2, grandmother to 6 grandchildren, who I last saw in the offic 07/09/2019. She ended up being admitted January 12, 2011, with chest pain and was catheterized, revealing a left main 3-vessel disease with an EF of 50% and high anterior hypokinesia, for which she underwent coronary artery bypass grafting by Dr. Tharon Aquas Trigt with a LIMA to her LAD, vein to an obtuse marginal branch and to the right coronary artery. Postop course was uncomplicated. A Myoview in April was normal. Her lipid profile was excellent. She saw Cecilie Kicks in our office on July 15 complaining of chest pain, and the concern was musculoskeletal versus pericarditis. A 2D echocardiogram was entirely normal. She ended up seeing Dr. Prescott Gum, who diagnosed a staph sternal wound infection, requiring hospitalization and debridement. She had a wound VAC on and fortunately has completely healed. Since I saw her in the office a year and a half ago she was complaining of some increased dyspnea on exertion and decreased exercise tolerance.  A Myoview stress test was normal as was a 2D echocardiogram.  It turns out that her increasing shortness of breath is probably related to exacerbation of her asthma at that time.    She has been diagnosed with renal cell carcinoma and is scheduled to undergo laparoscopic partial nephrectomy by Dr. Gilford Rile at [facility] urology.   She underwent meniscus repair by Dr. Latanya Maudlin  of her right knee which did not result in an acceptable clinical outcome and therefore she underwent right total knee replacement.S Her last Myoview stress test performed 12/28/2015 was low risk and nonischemic.   She apparently has a new lesion on her kidney which may require surgical  evaluation in the future.  She does complain of some dyspnea which is new since I saw her a year ago but denies chest pain.      Allergies  Allergen Reactions   Dimenhydrinate Other (See Comments)    "locks up kidneys"   Morphine And Related Nausea And Vomiting   Percocet [Oxycodone-Acetaminophen] Other (See Comments)    hallucinations   Zolpidem Other (See Comments)    Unusual behavior    Social History   Socioeconomic History   Marital status: Married    Spouse name: Not on file   Number of children: Not on file   Years of education: Not on file   Highest education level: Not on file  Occupational History   Not on file  Tobacco Use   Smoking status: Former    Types: Cigarettes    Quit date: 01/12/1980    Years since quitting: 40.6   Smokeless tobacco: Never  Vaping Use   Vaping Use: Never used  Substance and Sexual Activity   Alcohol use: No    Alcohol/week: 0.0 standard drinks   Drug use: No   Sexual activity: Yes  Other Topics Concern   Not on file  Social History Narrative   Not on file   Social Determinants of Health   Financial Resource Strain: Not on file  Food Insecurity: Not on file  Transportation Needs: Not on file  Physical Activity: Not on file  Stress: Not on file  Social Connections: Not on file  Intimate Partner Violence: Not on file  Review of Systems: General: negative for chills, fever, night sweats or weight changes.  Cardiovascular: negative for chest pain, dyspnea on exertion, edema, orthopnea, palpitations, paroxysmal nocturnal dyspnea or shortness of breath Dermatological: negative for rash Respiratory: negative for cough or wheezing Urologic: negative for hematuria Abdominal: negative for nausea, vomiting, diarrhea, bright red blood per rectum, melena, or hematemesis Neurologic: negative for visual changes, syncope, or dizziness All other systems reviewed and are otherwise negative except as noted above.    Blood pressure  114/72, pulse 83, height 5' 4.5" (1.638 m), weight 183 lb (83 kg), SpO2 95 %.  General appearance: alert and no distress Neck: no adenopathy, no carotid bruit, no JVD, supple, symmetrical, trachea midline, and thyroid not enlarged, symmetric, no tenderness/mass/nodules Lungs: clear to auscultation bilaterally Heart: regular rate and rhythm, S1, S2 normal, no murmur, click, rub or gallop Extremities: extremities normal, atraumatic, no cyanosis or edema Pulses: 2+ and symmetric Skin: Skin color, texture, turgor normal. No rashes or lesions Neurologic: Grossly normal  EKG not performed today  ASSESSMENT AND PLAN:   S/P CABG x 3: (lima-lad,svg-om,svg-rca) History of CAD status post coronary artery bypass grafting by Dr. Dahlia Byes January 2013 for left main three-vessel disease.  She had a LIMA to her LAD, vein to arced obtuse marginal branch and to the right coronary artery.  Myoview performed 12/28/2015 was unremarkable.  Since I saw her a year ago she has complained of some dyspnea but denies chest pain.  I am going to check a 2D echocardiogram.  Essential hypertension History of essential hypertension blood pressure measured today 114/72.  She is on metoprolol.  Hyperlipidemia History of this hyperlipidemia on statin therapy with lipid profile performed 05/03/2020 revealing total cholesterol 170, LDL 75 and HDL 82.     Lorretta Harp MD [facility], [facility] 08/11/2020 10:38 AM

## 2020-08-11 NOTE — Assessment & Plan Note (Signed)
History of essential hypertension blood pressure measured today 114/72.  She is on metoprolol.

## 2020-08-11 NOTE — Assessment & Plan Note (Signed)
History of this hyperlipidemia on statin therapy with lipid profile performed 05/03/2020 revealing total cholesterol 170, LDL 75 and HDL 82.

## 2020-08-23 ENCOUNTER — Other Ambulatory Visit: Payer: Self-pay

## 2020-08-23 MED ORDER — METOPROLOL TARTRATE 25 MG PO TABS: 25.0000 mg | ORAL_TABLET | Freq: Two times a day (BID) | ORAL | 3 refills | Status: DC

## 2020-08-26 DIAGNOSIS — M25561 Pain in right knee: Secondary | ICD-10-CM | POA: Diagnosis not present

## 2020-08-26 DIAGNOSIS — M25552 Pain in left hip: Secondary | ICD-10-CM | POA: Diagnosis not present

## 2020-08-31 ENCOUNTER — Other Ambulatory Visit: Payer: Self-pay

## 2020-08-31 ENCOUNTER — Inpatient Hospital Stay (HOSPITAL_COMMUNITY)
Admission: EM | Admit: 2020-08-31 | Discharge: 2020-09-03 | DRG: 247 | Disposition: A | Discharge status: Home / Self Care | Payer: PPO | Attending: Cardiology | Admitting: Cardiology

## 2020-08-31 ENCOUNTER — Encounter (HOSPITAL_COMMUNITY)
Admission: EM | Disposition: A | Discharge status: Home / Self Care | Payer: Self-pay | Source: Home / Self Care | Attending: Cardiology

## 2020-08-31 ENCOUNTER — Encounter (HOSPITAL_COMMUNITY): Payer: Self-pay

## 2020-08-31 ENCOUNTER — Emergency Department (HOSPITAL_COMMUNITY): Payer: PPO

## 2020-08-31 ENCOUNTER — Observation Stay (HOSPITAL_COMMUNITY): Payer: PPO

## 2020-08-31 DIAGNOSIS — Z7951 Long term (current) use of inhaled steroids: Secondary | ICD-10-CM

## 2020-08-31 DIAGNOSIS — I2510 Atherosclerotic heart disease of native coronary artery without angina pectoris: Secondary | ICD-10-CM | POA: Diagnosis not present

## 2020-08-31 DIAGNOSIS — Z8249 Family history of ischemic heart disease and other diseases of the circulatory system: Secondary | ICD-10-CM | POA: Diagnosis not present

## 2020-08-31 DIAGNOSIS — R079 Chest pain, unspecified: Secondary | ICD-10-CM | POA: Diagnosis not present

## 2020-08-31 DIAGNOSIS — J45909 Unspecified asthma, uncomplicated: Secondary | ICD-10-CM | POA: Diagnosis present

## 2020-08-31 DIAGNOSIS — I25709 Atherosclerosis of coronary artery bypass graft(s), unspecified, with unspecified angina pectoris: Secondary | ICD-10-CM | POA: Diagnosis not present

## 2020-08-31 DIAGNOSIS — I25710 Atherosclerosis of autologous vein coronary artery bypass graft(s) with unstable angina pectoris: Secondary | ICD-10-CM | POA: Diagnosis not present

## 2020-08-31 DIAGNOSIS — R42 Dizziness and giddiness: Secondary | ICD-10-CM | POA: Diagnosis not present

## 2020-08-31 DIAGNOSIS — I10 Essential (primary) hypertension: Secondary | ICD-10-CM | POA: Diagnosis present

## 2020-08-31 DIAGNOSIS — Z981 Arthrodesis status: Secondary | ICD-10-CM

## 2020-08-31 DIAGNOSIS — M797 Fibromyalgia: Secondary | ICD-10-CM | POA: Diagnosis present

## 2020-08-31 DIAGNOSIS — E785 Hyperlipidemia, unspecified: Secondary | ICD-10-CM | POA: Diagnosis present

## 2020-08-31 DIAGNOSIS — I200 Unstable angina: Secondary | ICD-10-CM

## 2020-08-31 DIAGNOSIS — M069 Rheumatoid arthritis, unspecified: Secondary | ICD-10-CM | POA: Diagnosis present

## 2020-08-31 DIAGNOSIS — I25110 Atherosclerotic heart disease of native coronary artery with unstable angina pectoris: Secondary | ICD-10-CM | POA: Diagnosis present

## 2020-08-31 DIAGNOSIS — I25700 Atherosclerosis of coronary artery bypass graft(s), unspecified, with unstable angina pectoris: Principal | ICD-10-CM | POA: Diagnosis present

## 2020-08-31 DIAGNOSIS — Z955 Presence of coronary angioplasty implant and graft: Secondary | ICD-10-CM

## 2020-08-31 DIAGNOSIS — Z87891 Personal history of nicotine dependence: Secondary | ICD-10-CM

## 2020-08-31 DIAGNOSIS — Z90710 Acquired absence of both cervix and uterus: Secondary | ICD-10-CM | POA: Diagnosis not present

## 2020-08-31 DIAGNOSIS — Z888 Allergy status to other drugs, medicaments and biological substances status: Secondary | ICD-10-CM

## 2020-08-31 DIAGNOSIS — Z20822 Contact with and (suspected) exposure to covid-19: Secondary | ICD-10-CM | POA: Diagnosis present

## 2020-08-31 DIAGNOSIS — E782 Mixed hyperlipidemia: Secondary | ICD-10-CM | POA: Diagnosis not present

## 2020-08-31 DIAGNOSIS — Z905 Acquired absence of kidney: Secondary | ICD-10-CM | POA: Diagnosis not present

## 2020-08-31 DIAGNOSIS — Z951 Presence of aortocoronary bypass graft: Secondary | ICD-10-CM | POA: Diagnosis not present

## 2020-08-31 DIAGNOSIS — Z96651 Presence of right artificial knee joint: Secondary | ICD-10-CM | POA: Diagnosis present

## 2020-08-31 DIAGNOSIS — K219 Gastro-esophageal reflux disease without esophagitis: Secondary | ICD-10-CM | POA: Diagnosis present

## 2020-08-31 DIAGNOSIS — Z79899 Other long term (current) drug therapy: Secondary | ICD-10-CM | POA: Diagnosis not present

## 2020-08-31 DIAGNOSIS — Z85528 Personal history of other malignant neoplasm of kidney: Secondary | ICD-10-CM

## 2020-08-31 DIAGNOSIS — Z885 Allergy status to narcotic agent status: Secondary | ICD-10-CM

## 2020-08-31 DIAGNOSIS — R0789 Other chest pain: Secondary | ICD-10-CM | POA: Diagnosis present

## 2020-08-31 DIAGNOSIS — R0602 Shortness of breath: Secondary | ICD-10-CM | POA: Diagnosis not present

## 2020-08-31 HISTORY — PX: LEFT HEART CATH AND CORS/GRAFTS ANGIOGRAPHY: CATH118250

## 2020-08-31 LAB — CBC
HCT: 41.4 % (ref 36.0–46.0)
HCT: 41.6 % (ref 36.0–46.0)
Hemoglobin: 13.2 g/dL (ref 12.0–15.0)
Hemoglobin: 13.2 g/dL (ref 12.0–15.0)
MCH: 29.3 pg (ref 26.0–34.0)
MCH: 29.1 pg (ref 26.0–34.0)
MCHC: 31.9 g/dL (ref 30.0–36.0)
MCHC: 31.7 g/dL (ref 30.0–36.0)
MCV: 92 fL (ref 80.0–100.0)
MCV: 91.6 fL (ref 80.0–100.0)
Platelets: 196 10*3/uL (ref 150–400)
Platelets: 200 10*3/uL (ref 150–400)
RBC: 4.5 MIL/uL (ref 3.87–5.11)
RBC: 4.54 MIL/uL (ref 3.87–5.11)
RDW: 13.9 % (ref 11.5–15.5)
RDW: 14 % (ref 11.5–15.5)
WBC: 9.5 10*3/uL (ref 4.0–10.5)
WBC: 10.1 10*3/uL (ref 4.0–10.5)
nRBC: 0 % (ref 0.0–0.2)
nRBC: 0 % (ref 0.0–0.2)

## 2020-08-31 LAB — COMPREHENSIVE METABOLIC PANEL
ALT: 17 U/L (ref 0–44)
AST: 18 U/L (ref 15–41)
Albumin: 3.9 g/dL (ref 3.5–5.0)
Alkaline Phosphatase: 103 U/L (ref 38–126)
Anion gap: 9 (ref 5–15)
BUN: 23 mg/dL (ref 8–23)
CO2: 26 mmol/L (ref 22–32)
Calcium: 8.6 mg/dL — ABNORMAL LOW (ref 8.9–10.3)
Chloride: 104 mmol/L (ref 98–111)
Creatinine, Ser: 0.96 mg/dL (ref 0.44–1.00)
GFR, Estimated: >60 mL/min (ref >60)
Glucose, Bld: 115 mg/dL — ABNORMAL HIGH (ref 70–99)
Potassium: 4.3 mmol/L (ref 3.5–5.1)
Sodium: 139 mmol/L (ref 135–145)
Total Bilirubin: 0.5 mg/dL (ref 0.3–1.2)
Total Protein: 6.8 g/dL (ref 6.5–8.1)

## 2020-08-31 LAB — RESP PANEL BY RT-PCR (FLU A&B, COVID) ARPGX2
Influenza A by PCR: NEGATIVE
Influenza B by PCR: NEGATIVE
SARS Coronavirus 2 by RT PCR: NEGATIVE

## 2020-08-31 LAB — CREATININE, SERUM
Creatinine, Ser: 0.92 mg/dL (ref 0.44–1.00)
GFR, Estimated: >60 mL/min (ref >60)

## 2020-08-31 LAB — TROPONIN I (HIGH SENSITIVITY)
Troponin I (High Sensitivity): 9 ng/L (ref <18)
Troponin I (High Sensitivity): 8 ng/L (ref <18)

## 2020-08-31 SURGERY — LEFT HEART CATH AND CORS/GRAFTS ANGIOGRAPHY
Anesthesia: LOCAL

## 2020-08-31 MED ORDER — HEPARIN (PORCINE) IN NACL 1000-0.9 UT/500ML-% IV SOLN
INTRAVENOUS | Status: AC
  Filled 2020-08-31: qty 500

## 2020-08-31 MED ORDER — MIDAZOLAM HCL 2 MG/2ML IJ SOLN
INTRAMUSCULAR | Status: AC
  Filled 2020-08-31: qty 2

## 2020-08-31 MED ORDER — SODIUM CHLORIDE 0.9 % IV SOLN: 250.0000 mL | INTRAVENOUS | Status: DC | PRN

## 2020-08-31 MED ORDER — ASPIRIN 81 MG PO CHEW: 81.0000 mg | CHEWABLE_TABLET | ORAL | Status: DC

## 2020-08-31 MED ORDER — HEPARIN SODIUM (PORCINE) 5000 UNIT/ML IJ SOLN: 5000.0000 [IU] | Freq: Three times a day (TID) | INTRAMUSCULAR | Status: DC

## 2020-08-31 MED ORDER — SODIUM CHLORIDE 0.9% FLUSH: 3.0000 mL | INTRAVENOUS | Status: DC | PRN

## 2020-08-31 MED ORDER — GABAPENTIN 600 MG PO TABS
1800.0000 mg | ORAL_TABLET | Freq: Every day | ORAL | Status: DC
  Administered 2020-08-31 – 2020-09-02 (×3): 1800 mg via ORAL
  Filled 2020-08-31 (×3): qty 3

## 2020-08-31 MED ORDER — SODIUM CHLORIDE 0.9% FLUSH
3.0000 mL | Freq: Two times a day (BID) | INTRAVENOUS | Status: DC
  Administered 2020-09-01 – 2020-09-02 (×2): 3 mL via INTRAVENOUS

## 2020-08-31 MED ORDER — ASPIRIN 300 MG RE SUPP
300.0000 mg | RECTAL | Status: DC
Start: 2020-08-31 — End: 2020-08-31

## 2020-08-31 MED ORDER — MONTELUKAST SODIUM 10 MG PO TABS
10.0000 mg | ORAL_TABLET | Freq: Every day | ORAL | Status: DC
  Administered 2020-09-01 – 2020-09-03 (×3): 10 mg via ORAL
  Filled 2020-08-31 (×4): qty 1

## 2020-08-31 MED ORDER — SODIUM CHLORIDE 0.9 % WEIGHT BASED INFUSION: 3.0000 mL/kg/h | INTRAVENOUS | Status: DC

## 2020-08-31 MED ORDER — ACETAMINOPHEN 325 MG PO TABS
650.0000 mg | ORAL_TABLET | Freq: Once | ORAL | Status: AC
  Administered 2020-08-31: 650 mg via ORAL
  Filled 2020-08-31: qty 2

## 2020-08-31 MED ORDER — NITROGLYCERIN 0.4 MG SL SUBL
0.4000 mg | SUBLINGUAL_TABLET | Freq: Once | SUBLINGUAL | Status: AC
  Administered 2020-08-31: 0.4 mg via SUBLINGUAL
  Filled 2020-08-31: qty 1

## 2020-08-31 MED ORDER — ASPIRIN 81 MG PO CHEW
324.0000 mg | CHEWABLE_TABLET | ORAL | Status: DC
Start: 2020-08-31 — End: 2020-08-31

## 2020-08-31 MED ORDER — MIDAZOLAM HCL 2 MG/2ML IJ SOLN
INTRAMUSCULAR | Status: DC | PRN
  Administered 2020-08-31 (×2): 1 mg via INTRAVENOUS

## 2020-08-31 MED ORDER — ONDANSETRON HCL 4 MG/2ML IJ SOLN: 4.0000 mg | Freq: Four times a day (QID) | INTRAMUSCULAR | Status: DC | PRN

## 2020-08-31 MED ORDER — ATORVASTATIN CALCIUM 80 MG PO TABS
80.0000 mg | ORAL_TABLET | Freq: Every day | ORAL | Status: DC
  Administered 2020-08-31 – 2020-09-02 (×3): 80 mg via ORAL
  Filled 2020-08-31 (×3): qty 1

## 2020-08-31 MED ORDER — LIDOCAINE HCL (PF) 1 % IJ SOLN
INTRAMUSCULAR | Status: DC | PRN
  Administered 2020-08-31: 2 mL via INTRADERMAL

## 2020-08-31 MED ORDER — TAMSULOSIN HCL 0.4 MG PO CAPS
0.4000 mg | ORAL_CAPSULE | Freq: Every day | ORAL | Status: DC
  Administered 2020-09-01 – 2020-09-03 (×3): 0.4 mg via ORAL
  Filled 2020-08-31 (×3): qty 1

## 2020-08-31 MED ORDER — SODIUM CHLORIDE 0.9 % WEIGHT BASED INFUSION: 1.0000 mL/kg/h | INTRAVENOUS | Status: DC

## 2020-08-31 MED ORDER — FENTANYL CITRATE (PF) 100 MCG/2ML IJ SOLN
INTRAMUSCULAR | Status: AC
  Filled 2020-08-31: qty 2

## 2020-08-31 MED ORDER — HEPARIN (PORCINE) IN NACL 1000-0.9 UT/500ML-% IV SOLN
INTRAVENOUS | Status: DC | PRN
  Administered 2020-08-31 (×2): 500 mL

## 2020-08-31 MED ORDER — LABETALOL HCL 5 MG/ML IV SOLN: 10.0000 mg | INTRAVENOUS | Status: AC | PRN

## 2020-08-31 MED ORDER — ATORVASTATIN CALCIUM 40 MG PO TABS: 40.0000 mg | ORAL_TABLET | Freq: Every day | ORAL | Status: DC

## 2020-08-31 MED ORDER — SODIUM CHLORIDE 0.9% FLUSH: 3.0000 mL | Freq: Two times a day (BID) | INTRAVENOUS | Status: DC

## 2020-08-31 MED ORDER — NITROGLYCERIN 0.4 MG SL SUBL: 0.4000 mg | SUBLINGUAL_TABLET | SUBLINGUAL | Status: DC | PRN

## 2020-08-31 MED ORDER — FENTANYL CITRATE (PF) 100 MCG/2ML IJ SOLN
INTRAMUSCULAR | Status: DC | PRN
  Administered 2020-08-31 (×3): 25 ug via INTRAVENOUS

## 2020-08-31 MED ORDER — VERAPAMIL HCL 2.5 MG/ML IV SOLN
INTRAVENOUS | Status: AC
  Filled 2020-08-31: qty 2

## 2020-08-31 MED ORDER — HEPARIN SODIUM (PORCINE) 5000 UNIT/ML IJ SOLN
5000.0000 [IU] | Freq: Three times a day (TID) | INTRAMUSCULAR | Status: DC
  Administered 2020-09-01 – 2020-09-02 (×4): 5000 [IU] via SUBCUTANEOUS
  Filled 2020-08-31 (×4): qty 1

## 2020-08-31 MED ORDER — SODIUM CHLORIDE 0.9 % IV SOLN: INTRAVENOUS | Status: AC

## 2020-08-31 MED ORDER — METOPROLOL TARTRATE 25 MG PO TABS
25.0000 mg | ORAL_TABLET | Freq: Two times a day (BID) | ORAL | Status: DC
  Administered 2020-08-31 – 2020-09-03 (×6): 25 mg via ORAL
  Filled 2020-08-31 (×6): qty 1

## 2020-08-31 MED ORDER — HEPARIN SODIUM (PORCINE) 1000 UNIT/ML IJ SOLN
INTRAMUSCULAR | Status: DC | PRN
  Administered 2020-08-31: 4000 [IU] via INTRAVENOUS

## 2020-08-31 MED ORDER — ASPIRIN EC 81 MG PO TBEC
81.0000 mg | DELAYED_RELEASE_TABLET | Freq: Every day | ORAL | Status: DC
  Administered 2020-09-01 – 2020-09-03 (×3): 81 mg via ORAL
  Filled 2020-08-31 (×3): qty 1

## 2020-08-31 MED ORDER — LIDOCAINE HCL (PF) 1 % IJ SOLN
INTRAMUSCULAR | Status: AC
  Filled 2020-08-31: qty 30

## 2020-08-31 MED ORDER — GABAPENTIN 600 MG PO TABS
1200.0000 mg | ORAL_TABLET | Freq: Every day | ORAL | Status: DC
  Administered 2020-09-01 – 2020-09-03 (×3): 1200 mg via ORAL
  Filled 2020-08-31 (×3): qty 2

## 2020-08-31 MED ORDER — IOHEXOL 350 MG/ML SOLN
INTRAVENOUS | Status: DC | PRN
  Administered 2020-08-31: 85 mL via INTRA_ARTERIAL

## 2020-08-31 MED ORDER — VERAPAMIL HCL 2.5 MG/ML IV SOLN
INTRAVENOUS | Status: DC | PRN
  Administered 2020-08-31: 10 mL via INTRA_ARTERIAL

## 2020-08-31 MED ORDER — HYDRALAZINE HCL 20 MG/ML IJ SOLN: 10.0000 mg | INTRAMUSCULAR | Status: AC | PRN

## 2020-08-31 SURGICAL SUPPLY — 12 items
CATH INFINITI 5 FR JL3.5 (CATHETERS) ×2 IMPLANT
CATH INFINITI 5FR MULTPACK ANG (CATHETERS) ×2 IMPLANT
DEVICE RAD COMP TR BAND LRG (VASCULAR PRODUCTS) ×2 IMPLANT
GLIDESHEATH SLEND A-KIT 6F 22G (SHEATH) ×2 IMPLANT
GUIDEWIRE INQWIRE 1.5J.035X260 (WIRE) ×1 IMPLANT
INQWIRE 1.5J .035X260CM (WIRE) ×2
KIT HEART LEFT (KITS) ×2 IMPLANT
PACK CARDIAC CATHETERIZATION (CUSTOM PROCEDURE TRAY) ×2 IMPLANT
SHEATH PROBE COVER 6X72 (BAG) ×2 IMPLANT
TRANSDUCER W/STOPCOCK (MISCELLANEOUS) ×2 IMPLANT
TUBING CIL FLEX 10 FLL-RA (TUBING) ×2 IMPLANT
WIRE HI TORQ VERSACORE-J 145CM (WIRE) ×2 IMPLANT

## 2020-08-31 NOTE — ED Triage Notes (Signed)
BIB EMS for chest pain that started this morning. Pt had a left jaw pain that radiated to her left shoulder. Hx if CABG1 0 years ago.

## 2020-08-31 NOTE — ED Notes (Signed)
Consent signed. All jewelries given to the husband at bedside.

## 2020-08-31 NOTE — Interval H&P Note (Signed)
Cath Lab Visit (complete for each Cath Lab visit)  Clinical Evaluation Leading to the Procedure:   ACS: Yes.    Non-ACS:    Anginal Classification: CCS III  Anti-ischemic medical therapy: No Therapy  Non-Invasive Test Results: No non-invasive testing performed  Prior CABG: Previous CABG      History and Physical Interval Note:  08/31/2020 3:42 PM  [name] Long  has presented today for surgery, with the diagnosis of unstable angina.  The various methods of treatment have been discussed with the patient and family. After consideration of risks, benefits and other options for treatment, the patient has consented to  Procedure(s): LEFT HEART CATH AND CORS/GRAFTS ANGIOGRAPHY (N/A) as a surgical intervention.  The patient's history has been reviewed, patient examined, no change in status, stable for surgery.  I have reviewed the patient's chart and labs.  Questions were answered to the patient's satisfaction.     [name]

## 2020-08-31 NOTE — ED Provider Notes (Signed)
[facility] EMERGENCY DEPARTMENT Provider Note   CSN: [id number] Arrival date & time: 08/31/20  [id number]     History Chief Complaint  Patient presents with   Chest Pain    [name] Long is a 80 y.o. female with a history of CAD status post CABG, asthma, fibromyalgia, hypertension, GERD, hyperlipidemia, hypertension, and rheumatoid arthritis who presents to the emergency department with complaints of chest pain that began at 830 this morning.  Patient states that last night she had some left-sided jaw discomfort that she did not think much of, then this morning it seemed to move down into her chest.  States that the pain in the chest is a central crushing heaviness that at worst was a 12 out of 10 in severity, this improved with nitroglycerin x2 prior to arrival.  She also received 324 mg of aspirin.  Her current pain is a 3 out of 10 in severity.  She had some associated dizziness with this.  She denies nausea, vomiting, diaphoresis, syncope, or leg pain/swelling.  She is followed by cardiologist Dr. Alvester Chou, she is scheduled to have an echocardiogram later this week due to having some shortness of breath with prolonged walking.  She has not had a cardiac catheterization in a while now.  HPI     Past Medical History:  Diagnosis Date   Abnormal EKG 01/12/2011   Allergy    RHINITIS   Arthritis    neck, back knees, hips   Asthma    Asthma 01/12/2011   Back pain, chronic--plans were for cervical disc surgery week of 01/16/11 01/12/2011   Blood clot in vein 2004   L leg   Blood transfusion without reported diagnosis 2004   Chronic low back pain    Complication of anesthesia    post op bladder retention after surgery   Connective tissue disease ([place])    Coronary artery disease    Crescendo angina, with EKG changes, new. 01/12/2011   Dyspnea    Fibromyalgia    Fibromyalgia    GERD (gastroesophageal reflux disease)    Headache    migraine - several in a yr.    History of  stress test 04/2011   Normal stress test   Hx of echocardiogram 07/19/2011   EF 50-55%. There is mild annular calcification, there is trace migtral regurgitation, there is trace tricupid regurgitation, pericardium is thick, there is no pericardial effusion.   Hypertension    Myocardial infarction [facility]) 2013   triple heart bypass   Neuromuscular disorder (HCC)    fibromyalgia    Osteoporosis    OSTEOPENIA   Personal history of colonic polyp - adenoma 08/05/2013   08/2013 - 7 mm adenoma - consider repeat colonoscopy 2020 (age 26)   Pneumonia 2018   numerous times, only admitted one time for pneumonia but she has had pneumonia numerous times;   02-19-2018 END OF NOVEMBER 2019  WAS ADMITTED FOR ABOUT  A WEEK DUE TO "DOUBLE PNEUMONIA", REPORTS TODAY FEELING BACK TO HER NORMAL , DENIES SOB, O2 SAT AT 80 ROOM AIR , AFEBRILE, OTHER VSS    PONV (postoperative nausea and vomiting)    PPD positive    Status post coronary artery bypass grafting    Urinary incontinence    post op, "sleepy bladder", urinar retention     Patient Active Problem List   Diagnosis Date Noted   Allergic rhinitis 05/03/2020   Aortic atherosclerosis ([place]) 12/11/2019   S/P TKR (total knee replacement), right 12/11/2019  History of right hip replacement 12/11/2019   History of migraine headaches 12/11/2019   History of renal cell cancer 03/13/2018   Vitamin D deficiency 07/23/2017   Osteopenia 07/23/2017   Essential hypertension 05/15/2017   Primary localized osteoarthritis of right hip 08/22/2016   Lumbar radiculopathy, right 03/06/2016   Personal history of colonic polyp - adenoma 08/05/2013   GERD (gastroesophageal reflux disease) 03/11/2013   Rheumatoid arthritis ([place]) 03/11/2013   High frequency hearing loss 03/11/2013   Tinnitus 03/11/2013   Hyperlipidemia 08/21/2012   S/P CABG x 3: (lima-lad,svg-om,svg-rca) 01/16/2011   CAD (coronary artery disease): multivessel, good LVF 50% at cath 01/16/2011   Back pain,  chronic--plans were for cervical disc surgery week of 01/16/11 01/12/2011   Asthma, chronic 01/12/2011   Fibromyalgia 01/12/2011    Past Surgical History:  Procedure Laterality Date   [place]   BIOPSY BREAST Left 03/27/2019   Benign fibroaipose tissue no evidence of malignancy   BLADDER REPAIR  1985   BREAST SURGERY  1992   REDUCTION, x3 surgeries overall on breast for augmentation    CARDIAC CATHETERIZATION  2013   CARPAL TUNNEL RELEASE Bilateral 1988, [place]   CORONARY ARTERY BYPASS GRAFT  01/14/2011   Procedure: CORONARY ARTERY BYPASS GRAFTING (CABG);  Surgeon: Tharon Aquas Adelene Idler, MD;  Location: [facility];  Service: Open Heart Surgery;  Laterality: N/A;  CABG x three, using right greater saphenous vein harvested endoscopically   EYE SURGERY Bilateral 1985   radiokerototomy   LEFT HEART CATHETERIZATION WITH CORONARY ANGIOGRAM N/A 01/13/2011   Procedure: LEFT HEART CATHETERIZATION WITH CORONARY ANGIOGRAM;  Surgeon: Troy Sine, MD;  Location: [facility] CATH LAB;  Service: Cardiovascular;  Laterality: N/A;   PUBOVAGINAL SLING  2002   ROBOT ASSISTED LAPAROSCOPIC NEPHRECTOMY Right 02/27/2018   Procedure: XI ROBOTIC ASSISTE PARTIAL NEPHRECTOMY;  Surgeon: Ceasar Mons, MD;  Location: [facility];  Service: Urology;  Laterality: Right;   STERNAL WOUND DEBRIDEMENT  07/27/2011   Procedure: STERNAL WOUND DEBRIDEMENT;  followed by wound vac Surgeon: Ivin Poot, MD;  Location: [facility];  Service: Open Heart Surgery;  Laterality: N/A;   TONSILLECTOMY  1952   TOTAL HIP ARTHROPLASTY Right 08/22/2016   Procedure: TOTAL HIP ARTHROPLASTY ANTERIOR APPROACH;  Surgeon: Melrose Nakayama, MD;  Location: [facility];  Service: Orthopedics;  Laterality: Right;   TOTAL KNEE ARTHROPLASTY Right 07/22/2019   Procedure: RIGHT TOTAL KNEE ARTHROPLASTY;  Surgeon: Melrose Nakayama, MD;  Location: [facility];  Service: Orthopedics;  Laterality: Right;    TRIGGER FINGER RELEASE Right 2014   4th finger   TROCHANTERIC BURSA EXCISION Right    bursa removed, R hip   TUBAL LIGATION       OB History   No obstetric history on file.     Family History  Adopted: Yes  Problem Relation Age of Onset   Hypertension Brother     Social History   Tobacco Use   Smoking status: Former    Types: Cigarettes    Quit date: 01/12/1980    Years since quitting: 40.6   Smokeless tobacco: Never  Vaping Use   Vaping Use: Never used  Substance Use Topics   Alcohol use: No    Alcohol/week: 0.0 standard drinks   Drug use: No    Home Medications Prior to Admission medications   Medication Sig Start Date End Date Taking? Authorizing Provider  albuterol (PROVENTIL HFA) 108 (  90 Base) MCG/ACT inhaler INHALE 2 PUFF AS DIRECTED FOUR TIMES A DAY AS NEEDED Patient taking differently: Inhale 1 puff into the lungs every 6 (six) hours as needed for wheezing or shortness of breath. 07/09/15   Denita Lung, MD  amoxicillin (AMOXIL) 500 MG capsule SMARTSIG:4 Capsule(s) By Mouth Patient not taking: Reported on 08/11/2020 07/21/20   [provider]  atorvastatin (LIPITOR) 20 MG tablet TAKE ONE TABLET BY MOUTH DAILY PATIENT MUST CALL MD FOR APPOINTMENT 01/15/20   Denita Lung, MD  BAC 50-325-40 MG tablet Take 1 tablet by mouth every 6 (six) hours as needed for headache. 02/03/19   [provider]  Calcium Carb-Cholecalciferol (CALTRATE 600+D3 PO) Take by mouth daily.    [provider]  carisoprodol (SOMA) 350 MG tablet Take 350 mg by mouth every 8 (eight) hours as needed (for arthritis pain).     [provider]  doxycycline (VIBRAMYCIN) 100 MG capsule Take 1 capsule (100 mg total) by mouth 2 (two) times daily. 05/25/20   [place], Drue Dun M, DO  Fluticasone-Salmeterol (ADVAIR DISKUS) 500-50 MCG/DOSE AEPB Inhale 1 puff into the lungs 2 (two) times daily. 12/09/18   Denita Lung, MD  gabapentin (NEURONTIN) 600 MG tablet Take 1,200-1,800  mg by mouth See admin instructions. Take 2 tablets (1200 mg) by mouth in the morning & take 3 tablets (1800 mg) by mouth at night    [provider]  HYDROmorphone (DILAUDID) 4 MG tablet Take 1 tablet (4 mg total) by mouth every 4 (four) hours as needed for severe pain. 02/27/18   Debbrah Alar, PA-C  magnesium oxide (MAG-OX) 400 MG tablet Take 400 mg by mouth at bedtime.    [provider]  metoprolol tartrate (LOPRESSOR) 25 MG tablet Take 1 tablet (25 mg total) by mouth 2 (two) times daily. 08/23/20   Lorretta Harp, MD  montelukast (SINGULAIR) 10 MG tablet TAKE ONE TABLET BY MOUTH DAILY 07/13/20   Denita Lung, MD  Multiple Vitamins-Minerals (PRESERVISION AREDS 2 PO) Take 1 tablet by mouth in the morning and at bedtime.    [provider]  silodosin (RAPAFLO) 8 MG CAPS capsule Take 8 mg by mouth daily.     [provider]    Allergies    Dimenhydrinate, Morphine and related, Percocet [oxycodone-acetaminophen], and Zolpidem  Review of Systems   Review of Systems  Constitutional:  Negative for chills, diaphoresis and fever.  Respiratory:  Negative for cough and shortness of breath (today).   Cardiovascular:  Positive for chest pain. Negative for leg swelling.  Gastrointestinal:  Negative for abdominal pain, constipation, nausea and vomiting.  Neurological:  Positive for light-headedness. Negative for syncope.  All other systems reviewed and are negative.  Physical Exam Updated Vital Signs BP 139/74   Pulse 69   Temp 97.9 F (36.6 C)   Resp 15   SpO2 95%   Physical Exam Vitals and nursing note reviewed.  Constitutional:      General: She is not in acute distress.    Appearance: She is well-developed. She is not toxic-appearing.  HENT:     Head: Normocephalic and atraumatic.  Eyes:     General:        Right eye: No discharge.        Left eye: No discharge.     Conjunctiva/sclera: Conjunctivae normal.  Cardiovascular:     Rate and  Rhythm: Normal rate and regular rhythm.     Pulses:  Radial pulses are 2+ on the right side and 2+ on the left side.  Pulmonary:     Effort: Pulmonary effort is normal. No respiratory distress.     Breath sounds: Normal breath sounds. No wheezing, rhonchi or rales.  Abdominal:     General: There is no distension.     Palpations: Abdomen is soft.     Tenderness: There is no abdominal tenderness.  Musculoskeletal:     Cervical back: Neck supple.     Right lower leg: No tenderness. No edema.     Left lower leg: No tenderness. No edema.  Skin:    General: Skin is warm and dry.     Findings: No rash.  Neurological:     Mental Status: She is alert.     Comments: Clear speech.   Psychiatric:        Behavior: Behavior normal.    ED Results / Procedures / Treatments   Labs (all labs ordered are listed, but only abnormal results are displayed) Labs Reviewed  RESP PANEL BY RT-PCR (FLU A&B, COVID) [id number]  COMPREHENSIVE METABOLIC PANEL  CBC  TROPONIN I (HIGH SENSITIVITY)    EKG EKG Interpretation  Date/Time:  Tuesday August 31 2020 09:14:19 EDT Ventricular Rate:  71 PR Interval:  166 QRS Duration: 95 QT Interval:  374 QTC Calculation: 407 R Axis:   58 Text Interpretation: Sinus rhythm Consider RVH or PMI w/ secondary repol abnrm Nonspecific T abnormalities, lateral leads t wave inversions present on prior ecg no acute STEMI Confirmed by Madalyn Rob [id number]) on 08/31/2020 9:16:31 AM  Radiology DG Chest Portable 1 View  Result Date: 08/31/2020 CLINICAL DATA:  Chest pain EXAM: PORTABLE CHEST 1 VIEW COMPARISON:  None. FINDINGS: Sternotomy wires overlie normal cardiac silhouette. Normal pulmonary vasculature. No effusion, infiltrate, or pneumothorax. No acute osseous abnormality. IMPRESSION: No acute cardiopulmonary process. Electronically Signed   By: Suzy Bouchard M.D.   On: 08/31/2020 10:10    Procedures Procedures   Medications Ordered in ED Medications   nitroGLYCERIN (NITROSTAT) SL tablet 0.4 mg (has no administration in time range)    ED Course  I have reviewed the triage vital signs and the nursing notes.  Pertinent labs & imaging results that were available during my care of the patient were reviewed by me and considered in my medical decision making (see chart for details).    MDM Rules/Calculators/A&P                           Patient presents to the emergency department with chest pain. Patient nontoxic appearing, in no apparent distress, vitals without significant abnormality. Fairly benign physical exam. Pain currently 3/10 in severity- nitroglycerin ordered.   Patient's primary cardiologist: Dr. Gwenlyn Found   DDX: ACS, pulmonary embolism, dissection, pneumothorax, pneumonia, arrhythmia, severe anemia, MSK, GERD, anxiety, abdominal process    Additional history obtained:  Additional history obtained from chart review & nursing note review.  Myo perfusion study 12/2015:  The left ventricular ejection fraction is normal (55-65%). Nuclear stress EF: 57%. Blood pressure demonstrated a normal response to exercise. There was no ST segment deviation noted during stress. The study is normal. This is a low risk study  EKG: inus rhythm Consider RVH or PMI w/ secondary repol abnrm Nonspecific T abnormalities, lateral leads t wave inversions present on prior ecg no acute STEM  Lab Tests:  I Ordered, reviewed, and interpreted labs, which included:  CBC: Unremarkable.  CMP: Mild hyperglycemia.  Troponin: WNL  Imaging Studies ordered:  I ordered imaging studies which included CXR, I independently reviewed, formal radiology impression shows: No acute cardiopulmonary process.  ED Course:  Patient with high risk heart pathway score, EKG without STEMI, initial troponin WNL, with overall concerning history will discuss w/ cardiology.   Cardiology service had placed admission orders, plan for heart catheterization.   Discussed w/  attending who is in agreement.   Portions of this note were generated with [employer]. Dictation errors may occur despite best attempts at proofreading.   Final Clinical Impression(s) / ED Diagnoses Final diagnoses:  Unstable angina [facility])    Rx / DC Orders ED Discharge Orders     None        Amaryllis Dyke, PA-C 08/31/20 1430    Lucrezia Starch, MD 09/02/20 [phone]

## 2020-08-31 NOTE — ED Notes (Signed)
HELP GET PATIENT INTO A GOWN ON THE MONITOR PATIENT IS RESTING WITH CALL BELL IN REACH

## 2020-08-31 NOTE — CV Procedure (Signed)
Occlusion of saphenous vein graft to RCA./PL branch Patent saphenous vein graft to obtuse marginal. Patent left internal mammary to LAD. Calcified 60 to 75% proximal RCA within an acute bend and 60% to 70% mid stenosis.  Right coronary is heavily calcified and quite tortuous. 80% left main 100% mid LAD preceded by 95% heavily calcified stenosis at the bifurcation with the first diagonal.  Diagonal is potentially ischemic. Ostial to proximal 70% circumflex.  Competitive flow in first marginal related to bypass graft flow Normal LV function with normal LVEDP.  Medical therapy and if recurring symptoms consider relook at right coronary.  RCA will be difficult to intervene upon because of calcification and tortuosity.  If therapy needed, femoral approach may be best option.

## 2020-08-31 NOTE — H&P (Addendum)
Cardiology History and Physical:   Patient ID: [name] Long; OE:[id number]; 1940/12/18   Admit date: 08/31/2020 Date of Consult: 08/31/2020  Primary Care Provider: Denita Lung, MD Primary Cardiologist: Dr. Gwenlyn Found, MD   Patient Profile:   [name] Long is a 80 y.o. female with a hx of CAD s/p CABG with LIMA to LAD, SVG to OM and RCA, post operative sternal wound infection, renal cell carcinoma s/p partial nephrectomy (follows with [name] Long), HTN, and HLD who is being seen today for the evaluation of chest pain at the request of [name] Maes, PA.   History of Present Illness:   [name] Long is a 80yo with a hx as stated above who presented to [facility] 08/31/20 with acute onset of anterior chest pain with radiation to left jaw and shoulder. Pt states that she was not feeling well yesterday. In the later afternoon, she began having left sided jaw pain which radiated to her left shoulder. She attempted relief with Tylenol and an ice pack thinking that it was musculoskeletal however her symptoms persisted. She states that her husband had a cardiology appointment today. On their way to the office, she started having very intense chest pain with radiation to her left jaw. On their arrival to the office, EMS was called and she received 2 SL NTG tablets with relief. She continues to have mild chest pressure since her arrival. Given her prior hx of CABG cardiology has been asked to evaluate.   In the ED, EKG is abnormal with diffuse TWI however present on prior tracings. HsT Long to be 9 with pending repeat. Creatinine stable at 0.96. No electrolyte abnormalities. CXR with no acute cardiopulmonary process. She reports that she walks daily and recalls feeling more SOB over the last several months to the point she has to stop during her walk however she has contributed this to her knee replacement and brief pause in her routine exercise while healing. Plan at last follow up was for repeat echocardiogram  which has been scheduled.   She follows with [name] Long for her cardiology care. She was admitted 01/2011 with chest pain and underwent LHC which showed 3VD and she was referred to [facility] for surgical intervention. She ultimately underwent CABG with LIMA to LAD, SVG to OM and RCA. Her last ischemic evaluation was in 2017 with a Myoview stress test which was normal.    Past Medical History:  Diagnosis Date   Abnormal EKG 01/12/2011   Allergy    RHINITIS   Arthritis    neck, back knees, hips   Asthma    Asthma 01/12/2011   Back pain, chronic--plans were for cervical disc surgery week of 01/16/11 01/12/2011   Blood clot in vein 2004   L leg   Blood transfusion without reported diagnosis 2004   Chronic low back pain    Complication of anesthesia    post op bladder retention after surgery   Connective tissue disease ([place])    Coronary artery disease    Crescendo angina, with EKG changes, new. 01/12/2011   Dyspnea    Fibromyalgia    Fibromyalgia    GERD (gastroesophageal reflux disease)    Headache    migraine - several in a yr.    History of stress test 04/2011   Normal stress test   Hx of echocardiogram 07/19/2011   EF 50-55%. There is mild annular calcification, there is trace migtral regurgitation, there is trace tricupid regurgitation, pericardium is thick, there is no  pericardial effusion.   Hypertension    Myocardial infarction [facility]) 2013   triple heart bypass   Neuromuscular disorder (HCC)    fibromyalgia    Osteoporosis    OSTEOPENIA   Personal history of colonic polyp - adenoma 08/05/2013   08/2013 - 7 mm adenoma - consider repeat colonoscopy 2020 (age 16)   Pneumonia 2018   numerous times, only admitted one time for pneumonia but she has had pneumonia numerous times;   02-19-2018 END OF NOVEMBER 2019  WAS ADMITTED FOR ABOUT  A WEEK DUE TO "DOUBLE PNEUMONIA", REPORTS TODAY FEELING BACK TO HER NORMAL , DENIES SOB, O2 SAT AT 7 ROOM AIR , AFEBRILE, OTHER VSS    PONV (postoperative  nausea and vomiting)    PPD positive    Status post coronary artery bypass grafting    Urinary incontinence    post op, "sleepy bladder", urinar retention     Past Surgical History:  Procedure Laterality Date   [place] Left 03/27/2019   Benign fibroaipose tissue no evidence of malignancy   BLADDER REPAIR  1985   BREAST SURGERY  1992   REDUCTION, x3 surgeries overall on breast for augmentation    CARDIAC CATHETERIZATION  2013   CARPAL TUNNEL RELEASE Bilateral 1988, [place]   CORONARY ARTERY BYPASS GRAFT  01/14/2011   Procedure: CORONARY ARTERY BYPASS GRAFTING (CABG);  Surgeon: Tharon Aquas Adelene Idler, MD;  Location: [facility];  Service: Open Heart Surgery;  Laterality: N/A;  CABG x three, using right greater saphenous vein harvested endoscopically   EYE SURGERY Bilateral 1985   radiokerototomy   LEFT HEART CATHETERIZATION WITH CORONARY ANGIOGRAM N/A 01/13/2011   Procedure: LEFT HEART CATHETERIZATION WITH CORONARY ANGIOGRAM;  Surgeon: Troy Sine, MD;  Location: [facility] CATH LAB;  Service: Cardiovascular;  Laterality: N/A;   PUBOVAGINAL SLING  2002   ROBOT ASSISTED LAPAROSCOPIC NEPHRECTOMY Right 02/27/2018   Procedure: XI ROBOTIC ASSISTE PARTIAL NEPHRECTOMY;  Surgeon: Ceasar Mons, MD;  Location: [facility];  Service: Urology;  Laterality: Right;   STERNAL WOUND DEBRIDEMENT  07/27/2011   Procedure: STERNAL WOUND DEBRIDEMENT;  followed by wound vac Surgeon: Ivin Poot, MD;  Location: [place];  Service: Open Heart Surgery;  Laterality: N/A;   TONSILLECTOMY  1952   TOTAL HIP ARTHROPLASTY Right 08/22/2016   Procedure: TOTAL HIP ARTHROPLASTY ANTERIOR APPROACH;  Surgeon: Melrose Nakayama, MD;  Location: [facility];  Service: Orthopedics;  Laterality: Right;   TOTAL KNEE ARTHROPLASTY Right 07/22/2019   Procedure: RIGHT TOTAL KNEE ARTHROPLASTY;  Surgeon: Melrose Nakayama, MD;  Location: [facility];  Service:  Orthopedics;  Laterality: Right;   TRIGGER FINGER RELEASE Right 2014   4th finger   TROCHANTERIC BURSA EXCISION Right    bursa removed, R hip   TUBAL LIGATION       Prior to Admission medications   Medication Sig Start Date End Date Taking? Authorizing Provider  albuterol (PROVENTIL HFA) 108 (90 Base) MCG/ACT inhaler INHALE 2 PUFF AS DIRECTED FOUR TIMES A DAY AS NEEDED Patient taking differently: Inhale 1 puff into the lungs every 6 (six) hours as needed for wheezing or shortness of breath. 07/09/15   [name] Lung, MD  amoxicillin (AMOXIL) 500 MG capsule SMARTSIG:4 Capsule(s) By Mouth Patient not taking: Reported on 08/11/2020 07/21/20   [provider]  atorvastatin (LIPITOR) 20 MG tablet TAKE ONE TABLET BY MOUTH DAILY PATIENT MUST  CALL MD FOR APPOINTMENT 01/15/20   [name] Lung, MD  BAC [phone] MG tablet Take 1 tablet by mouth every 6 (six) hours as needed for headache. 02/03/19   [provider]  Calcium Carb-Cholecalciferol (CALTRATE 600+D3 PO) Take by mouth daily.    [provider]  carisoprodol (SOMA) 350 MG tablet Take 350 mg by mouth every 8 (eight) hours as needed (for arthritis pain).     [provider]  doxycycline (VIBRAMYCIN) 100 MG capsule Take 1 capsule (100 mg total) by mouth 2 (two) times daily. 05/25/20   Horton, Drue Dun M, DO  Fluticasone-Salmeterol (ADVAIR DISKUS) 500-50 MCG/DOSE AEPB Inhale 1 puff into the lungs 2 (two) times daily. 12/09/18   [name] Lung, MD  gabapentin (NEURONTIN) 600 MG tablet Take 1,200-1,800 mg by mouth See admin instructions. Take 2 tablets (1200 mg) by mouth in the morning & take 3 tablets (1800 mg) by mouth at night    [provider]  HYDROmorphone (DILAUDID) 4 MG tablet Take 1 tablet (4 mg total) by mouth every 4 (four) hours as needed for severe pain. 02/27/18   Debbrah Alar, PA-C  magnesium oxide (MAG-OX) 400 MG tablet Take 400 mg by mouth at bedtime.    [provider]  metoprolol  tartrate (LOPRESSOR) 25 MG tablet Take 1 tablet (25 mg total) by mouth 2 (two) times daily. 08/23/20   Lorretta Harp, MD  montelukast (SINGULAIR) 10 MG tablet TAKE ONE TABLET BY MOUTH DAILY 07/13/20   [name] Lung, MD  Multiple Vitamins-Minerals (PRESERVISION AREDS 2 PO) Take 1 tablet by mouth in the morning and at bedtime.    [provider]  silodosin (RAPAFLO) 8 MG CAPS capsule Take 8 mg by mouth daily.     [provider]    Inpatient Medications: Scheduled Meds:  Continuous Infusions:  PRN Meds:   Allergies:    Allergies  Allergen Reactions   Dimenhydrinate Other (See Comments)    "locks up kidneys"   Morphine And Related Nausea And Vomiting   Percocet [Oxycodone-Acetaminophen] Other (See Comments)    hallucinations   Zolpidem Other (See Comments)    Unusual behavior    Social History:   Social History   Socioeconomic History   Marital status: Married    Spouse name: Not on file   Number of children: Not on file   Years of education: Not on file   Highest education level: Not on file  Occupational History   Not on file  Tobacco Use   Smoking status: Former    Types: Cigarettes    Quit date: 01/12/1980    Years since quitting: 40.6   Smokeless tobacco: Never  Vaping Use   Vaping Use: Never used  Substance and Sexual Activity   Alcohol use: No    Alcohol/week: 0.0 standard drinks   Drug use: No   Sexual activity: Yes  Other Topics Concern   Not on file  Social History Narrative   Not on file   Social Determinants of Health   Financial Resource Strain: Not on file  Food Insecurity: Not on file  Transportation Needs: Not on file  Physical Activity: Not on file  Stress: Not on file  Social Connections: Not on file  Intimate Partner Violence: Not on file    Family History:   Family History  Adopted: Yes  Problem Relation Age of Onset   Hypertension Brother    Family Status:  Family Status  Relation Name Status  Mother   Deceased   Father  Deceased   Brother  Alive    ROS:  Please see the history of present illness.  All other ROS reviewed and negative.     Physical Exam/Data:   Vitals:   08/31/20 0914 08/31/20 0930 08/31/20 1015 08/31/20 1030  BP: 139/74 123/78 117/71 126/80  Pulse: 69 66 64 67  Resp: '15 13 13 18  '$ Temp: 97.9 F (36.6 C)     SpO2: 95% 94% 92% 92%   No intake or output data in the 24 hours ending 08/31/20 1144 There were no vitals filed for this visit. There is no height or weight on file to calculate BMI.   General: Well developed, well nourished, NAD Neck: Negative for carotid bruits. No JVD Lungs:Clear to ausculation bilaterally. Breathing is unlabored. Cardiovascular: RRR with S1 S2. No murmurs, rubs, gallops, or LV heave appreciated. Abdomen: Soft, non-tender, non-distended. No obvious abdominal masses. MSK: Strength and tone appear normal for age. 5/5 in all extremities Extremities: No edema. Radial pulses 2+ bilaterally Neuro: Alert and oriented. No focal deficits. No facial asymmetry. MAE spontaneously. Psych: Responds to questions appropriately with normal affect.    EKG:  The EKG was personally reviewed and demonstrates: 08/31/20 NSR with diffuse TWI , present on prior tracings. No acute changes.  Telemetry:  Telemetry was personally reviewed and demonstrates: 08/31/20 NSR  Relevant CV Studies:  Echocardiogram 11/20/2017:  - Left ventricle: The cavity size was normal. There was mild    concentric hypertrophy. Systolic function was normal. The    estimated ejection fraction was in the range of 60% to 65%. Wall    motion was normal; there were no regional wall motion    abnormalities. Features are consistent with a pseudonormal left    ventricular filling pattern, with concomitant abnormal relaxation    and increased filling pressure (grade 2 diastolic dysfunction).    Doppler parameters are consistent with elevated ventricular    end-diastolic filling pressure.   - Aortic root: The aortic root was normal in size.  - Mitral valve: Calcified annulus. Mildly thickened leaflets .    There was mild regurgitation.  - Right ventricle: The cavity size was normal. Wall thickness was    normal. Systolic function was normal.  - Right atrium: The atrium was normal in size.  - Tricuspid valve: There was mild regurgitation.  - Pulmonary arteries: Systolic pressure was mildly increased. PA    peak pressure: 32 mm Hg (S).  - Inferior vena cava: The vessel was normal in size.  - Pericardium, extracardiac: There was no pericardial effusion.   Myoview stress test 12/28/2015:  The left ventricular ejection fraction is normal (55-65%). Nuclear stress EF: 57%. Blood pressure demonstrated a normal response to exercise. There was no ST segment deviation noted during stress. The study is normal. This is a low risk study.   LHC 01/13/2011:  ANGIOGRAPHIC DATA:  There was extensive coronary artery calcification involving the left main, LAD, ostial circumflex, and also calcification of the proximal right coronary artery.   The left main coronary artery had eccentric 60% calcified stenosis prior to bifurcating into an LAD and left circumflex system.   The LAD had 99% stenosis just beyond the ostium and up to the takeoff of the first diagonal vessel.  The remainder of the LAD was free of significant disease.   The circumflex vessel had very ostial eccentric 90-95% stenosis, best seen on the LAO view.  The circumflex gave rise to a  marginal vessel.   The right coronary artery had calcification proximally with 60% narrowing in the proximal bend.   The left subclavian and internal mammary artery were normal and suitable for CABG surgery.   RAO ventriculography revealed low-normal ejection fraction at 50-55%. There was mild-to-moderate focal anterolateral hypocontractility.   Distal aortography revealed a normal aortoiliac system without evidence for renal  artery stenosis.   IMPRESSION: 1. Severe coronary calcification with significant stenoses with 60%     distal calcified left main stenosis, 99% near ostial LAD stenosis,     and 95% ostial circumflex stenosis. 2. 60% proximal stenosis in the right coronary artery. 3. Low-normal left ventricle function with mild-to-moderate focal     anterolateral hypocontractility. 4. Normal aortoiliac system.   RECOMMENDATION:  CABG revascularization surgery.  Laboratory Data:  Chemistry Recent Labs  Lab 08/31/20 0926  NA 139  K 4.3  CL 104  CO2 26  GLUCOSE 115*  BUN 23  CREATININE 0.96  CALCIUM 8.6*  GFRNONAA >60  ANIONGAP 9    Total Protein  Date Value Ref Range Status  08/31/2020 6.8 6.5 - 8.1 g/dL Final  05/03/2020 7.3 6.0 - 8.5 g/dL Final   Albumin  Date Value Ref Range Status  08/31/2020 3.9 3.5 - 5.0 g/dL Final  05/03/2020 4.6 3.7 - 4.7 g/dL Final   AST  Date Value Ref Range Status  08/31/2020 18 15 - 41 U/L Final   ALT  Date Value Ref Range Status  08/31/2020 17 0 - 44 U/L Final   Alkaline Phosphatase  Date Value Ref Range Status  08/31/2020 103 38 - 126 U/L Final   Total Bilirubin  Date Value Ref Range Status  08/31/2020 0.5 0.3 - 1.2 mg/dL Final   Bilirubin Total  Date Value Ref Range Status  05/03/2020 0.3 0.0 - 1.2 mg/dL Final   Hematology Recent Labs  Lab 08/31/20 0926  WBC 9.5  RBC 4.50  HGB 13.2  HCT 41.4  MCV 92.0  MCH 29.3  MCHC 31.9  RDW 13.9  PLT 196   Cardiac EnzymesNo results for input(s): TROPONINI in the last 168 hours. No results for input(s): TROPIPOC in the last 168 hours.  BNPNo results for input(s): BNP, PROBNP in the last 168 hours.  DDimer No results for input(s): DDIMER in the last 168 hours. TSH:  Lab Results  Component Value Date   TSH 0.994 01/12/2011   Lipids: Lab Results  Component Value Date   CHOL 170 05/03/2020   HDL 82 05/03/2020   LDLCALC 75 05/03/2020   TRIG 70 05/03/2020   CHOLHDL 2.1 05/03/2020    HgbA1c: Lab Results  Component Value Date   HGBA1C 6.1 (H) 05/03/2020    Radiology/Studies:  DG Chest Portable 1 View  Result Date: 08/31/2020 CLINICAL DATA:  Chest pain EXAM: PORTABLE CHEST 1 VIEW COMPARISON:  None. FINDINGS: Sternotomy wires overlie normal cardiac silhouette. Normal pulmonary vasculature. No effusion, infiltrate, or pneumothorax. No acute osseous abnormality. IMPRESSION: No acute cardiopulmonary process. Electronically Signed   By: Suzy Bouchard M.D.   On: 08/31/2020 10:10    Assessment and Plan:   1. Unstable angina: -Pt underwent CABG with LIMA to LAD, SVG to OM and RCA. Last ischemic evaluation with stress testing 2017 with normal stress test and echocardiogram. She presented to [facility] 08/31/20 with acute onset of anterior chest pain with radiation to left jaw and shoulder. Pt states that she was not feeling well yesterday. In the later afternoon, she began having left sided  jaw pain which radiated to her left shoulder. She attempted relief with Tylenol and an ice pack thinking that it was musculoskeletal however her symptoms persisted. She states that her husband had a cardiology appointment today. On their way to the office, she started having very intense chest pain with radiation to her left jaw. On their arrival to the office, EMS was called and she received 2 SL NTG tablets with relief. She continues to have mild chest pressure since her arrival. -EKG is abnormal with diffuse TWI however present on prior tracings.  -HsT Long to be 9 with pending repeat.  -Creatinine stable at 0.96.  -CXR with no acute cardiopulmonary process.  -Plan for further evaluation with cardiac catheterization  -Received ASA '325mg'$   -The patient understands that risks include but are not limited to stroke (1 in 1000), death (1 in 1000), kidney failure [usually temporary] (1 in 500), bleeding (1 in 200), allergic reaction [possibly serious] (1 in 200), and agrees to proceed.    2.  HTN: -Stable, 103/67>>102/89>>126/80 -Continue home metoprolol  3. HLD: -Last LDL, 52 from 05/17/2017 -Continue statin   4. Hx of renal cell carcinoma s/p partial nephrectomy: -Follows with [name] Long -Creatinine stable at 0.96  -Follow closely in the post cath setting -Daily BMET    For questions or updates, please contact [place] Please consult [email or web address] for contact info under Cardiology/STEMI.   Signed, Kathyrn Drown NP-C [facility] Pager: [phone] 08/31/2020 11:44 AM   ATTENDING ATTESTATION  I have seen, examined and evaluated the patient this PM in the ER along with Kathyrn Drown NP-C.  After reviewing all the available data and chart, we discussed the patients laboratory, study & physical findings as well as symptoms in detail. I agree with jher findings, examination as well as impression recommendations as per our discussion.    Patient with known CAD-CABG with pending echocardiogram for some exertional dyspnea who now presents with initially having neck pain that radiated to the jaw then down the left arm followed by severe chest tightness and squeezing consistent with unstable angina.  At present, her troponin levels are negative and she is symptom-free after being treated with nitroglycerin.  Based on her presentation, I am concerned that this is indeed unstable angina and agree with the plan to proceed with cardiac catheterization.  We can also check her echocardiogram while inpatient.  Shared Decision Making/Informed Consent{ The risks [stroke (1 in 1000), death (1 in 1000), kidney failure [usually temporary] (1 in 500), bleeding (1 in 200), allergic reaction [possibly serious] (1 in 200)], benefits (diagnostic support and management of coronary artery disease) and alternatives of a cardiac catheterization were discussed in detail with Ms. Borg and she is willing to proceed. I did discuss with her the increased risk of periprocedural complications with her  history of CABG both  basic general diagnostic risk as well as interventional related risk.  We discussed increased risk of stroke, and procedure related MI.  Acknowledging these risks, she is willing to proceed.  Further plans pending cardiac catheterization results.    Glenetta Hew, M.D., M.S. Interventional Cardiologist   Pager # [phone] Phone # [phone] [address] [place], [place] [address]

## 2020-09-01 ENCOUNTER — Other Ambulatory Visit (HOSPITAL_COMMUNITY): Payer: Self-pay

## 2020-09-01 ENCOUNTER — Observation Stay (HOSPITAL_BASED_OUTPATIENT_CLINIC_OR_DEPARTMENT_OTHER): Payer: PPO

## 2020-09-01 DIAGNOSIS — Z951 Presence of aortocoronary bypass graft: Secondary | ICD-10-CM | POA: Diagnosis not present

## 2020-09-01 DIAGNOSIS — E782 Mixed hyperlipidemia: Secondary | ICD-10-CM

## 2020-09-01 DIAGNOSIS — R079 Chest pain, unspecified: Secondary | ICD-10-CM | POA: Diagnosis not present

## 2020-09-01 DIAGNOSIS — I10 Essential (primary) hypertension: Secondary | ICD-10-CM | POA: Diagnosis not present

## 2020-09-01 DIAGNOSIS — I25700 Atherosclerosis of coronary artery bypass graft(s), unspecified, with unstable angina pectoris: Secondary | ICD-10-CM | POA: Diagnosis not present

## 2020-09-01 DIAGNOSIS — I200 Unstable angina: Secondary | ICD-10-CM | POA: Diagnosis not present

## 2020-09-01 DIAGNOSIS — E785 Hyperlipidemia, unspecified: Secondary | ICD-10-CM | POA: Diagnosis not present

## 2020-09-01 LAB — CBC
HCT: 39.3 % (ref 36.0–46.0)
Hemoglobin: 12.6 g/dL (ref 12.0–15.0)
MCH: 29.1 pg (ref 26.0–34.0)
MCHC: 32.1 g/dL (ref 30.0–36.0)
MCV: 90.8 fL (ref 80.0–100.0)
Platelets: 185 10*3/uL (ref 150–400)
RBC: 4.33 MIL/uL (ref 3.87–5.11)
RDW: 14 % (ref 11.5–15.5)
WBC: 8.8 10*3/uL (ref 4.0–10.5)
nRBC: 0 % (ref 0.0–0.2)

## 2020-09-01 LAB — ECHOCARDIOGRAM COMPLETE
Area-P 1/2: 2.48 cm2
Calc EF: 55.4 %
S' Lateral: 2.8 cm
Single Plane A2C EF: 67.5 %
Single Plane A4C EF: 48.7 %
Weight: 2830.4 oz

## 2020-09-01 LAB — LIPID PANEL
Cholesterol: 143 mg/dL (ref 0–200)
HDL: 70 mg/dL (ref >40)
LDL Cholesterol: 62 mg/dL (ref 0–99)
Total CHOL/HDL Ratio: 2 RATIO
Triglycerides: 57 mg/dL (ref <150)
VLDL: 11 mg/dL (ref 0–40)

## 2020-09-01 LAB — BASIC METABOLIC PANEL
Anion gap: 8 (ref 5–15)
BUN: 24 mg/dL — ABNORMAL HIGH (ref 8–23)
CO2: 27 mmol/L (ref 22–32)
Calcium: 8.8 mg/dL — ABNORMAL LOW (ref 8.9–10.3)
Chloride: 105 mmol/L (ref 98–111)
Creatinine, Ser: 0.97 mg/dL (ref 0.44–1.00)
GFR, Estimated: 60 mL/min — ABNORMAL LOW (ref >60)
Glucose, Bld: 97 mg/dL (ref 70–99)
Potassium: 4.5 mmol/L (ref 3.5–5.1)
Sodium: 140 mmol/L (ref 135–145)

## 2020-09-01 MED ORDER — ATORVASTATIN CALCIUM 80 MG PO TABS
80.0000 mg | ORAL_TABLET | Freq: Every day | ORAL | 3 refills | Status: DC
  Filled 2020-09-01: qty 90, 90d supply, fill #0

## 2020-09-01 MED ORDER — ACETAMINOPHEN 325 MG PO TABS
650.0000 mg | ORAL_TABLET | Freq: Four times a day (QID) | ORAL | Status: DC | PRN
  Administered 2020-09-01 – 2020-09-03 (×4): 650 mg via ORAL
  Filled 2020-09-01 (×4): qty 2

## 2020-09-01 MED ORDER — ISOSORBIDE MONONITRATE ER 30 MG PO TB24
30.0000 mg | ORAL_TABLET | Freq: Every day | ORAL | 11 refills | Status: DC
  Filled 2020-09-01: qty 30, 30d supply, fill #0

## 2020-09-01 MED ORDER — PERFLUTREN LIPID MICROSPHERE
1.0000 mL | INTRAVENOUS | Status: AC | PRN
  Administered 2020-09-01: 2 mL via INTRAVENOUS
  Filled 2020-09-01: qty 10

## 2020-09-01 MED ORDER — HYDROMORPHONE HCL 2 MG PO TABS
2.0000 mg | ORAL_TABLET | Freq: Once | ORAL | Status: AC
  Administered 2020-09-01: 2 mg via ORAL
  Filled 2020-09-01: qty 1

## 2020-09-01 MED ORDER — ISOSORBIDE MONONITRATE ER 30 MG PO TB24
30.0000 mg | ORAL_TABLET | Freq: Every day | ORAL | Status: DC
  Administered 2020-09-01: 30 mg via ORAL
  Filled 2020-09-01: qty 1

## 2020-09-01 MED ORDER — NITROGLYCERIN 0.4 MG SL SUBL
0.4000 mg | SUBLINGUAL_TABLET | SUBLINGUAL | 1 refills | Status: DC | PRN
  Filled 2020-09-01: qty 25, 8d supply, fill #0

## 2020-09-01 NOTE — Progress Notes (Addendum)
Progress Note  Patient Name: [name] Long Date of Encounter: 09/01/2020  [facility] Cardiologist: Quay Burow, MD   Subjective   Seen this morning. No chest pain. Some shortness of breath. Was able to ambulate in the hallway but felt dizzy and short of breath.   Inpatient Medications    Scheduled Meds:  aspirin EC  81 mg Oral Daily   atorvastatin  80 mg Oral Daily   gabapentin  1,200 mg Oral Daily   gabapentin  1,800 mg Oral QHS   heparin  5,000 Units Subcutaneous Q8H   isosorbide mononitrate  30 mg Oral Daily   metoprolol tartrate  25 mg Oral BID   montelukast  10 mg Oral Daily   sodium chloride flush  3 mL Intravenous Q12H   sodium chloride flush  3 mL Intravenous Q12H   tamsulosin  0.4 mg Oral Daily   Continuous Infusions:  sodium chloride     PRN Meds: sodium chloride, acetaminophen, nitroGLYCERIN, ondansetron (ZOFRAN) IV, sodium chloride flush   Vital Signs    Vitals:   09/01/20 1323 09/01/20 1329 09/01/20 1404 09/01/20 1407  BP: (!) 97/57 106/62 (!) 88/52 (!) 90/56  Pulse:  74 73   Resp:  18 20   Temp:      TempSrc:      SpO2:      Weight:      Height:        Intake/Output Summary (Last 24 hours) at 09/01/2020 1417 Last data filed at 09/01/2020 1200 Gross per 24 hour  Intake 984.06 ml  Output --  Net 984.06 ml   Last 3 Weights 09/01/2020 08/11/2020 05/25/2020  Weight (lbs) 176 lb 14.4 oz 183 lb 170 lb  Weight (kg) 80.241 kg 83.008 kg 77.111 kg      Telemetry    SR - Personally Reviewed  ECG    SR with deep TWI in anterolateral leads  Physical Exam   GEN: No acute distress.   Neck: No JVD Cardiac: RRR, no murmurs, rubs, or gallops.  Respiratory: Clear to auscultation bilaterally. GI: Soft, nontender, non-distended  MS: No edema; No deformity. Left radial cath site stable.  Neuro:  Nonfocal  Psych: Normal affect   Labs    High Sensitivity Troponin:   Recent Labs  Lab 08/31/20 0926 08/31/20 1126  TROPONINIHS 9 8       Chemistry Recent Labs  Lab 08/31/20 0926 08/31/20 1828 09/01/20 0241  NA 139  --  140  K 4.3  --  4.5  CL 104  --  105  CO2 26  --  27  GLUCOSE 115*  --  97  BUN 23  --  24*  CREATININE 0.96 0.92 0.97  CALCIUM 8.6*  --  8.8*  PROT 6.8  --   --   ALBUMIN 3.9  --   --   AST 18  --   --   ALT 17  --   --   ALKPHOS 103  --   --   BILITOT 0.5  --   --   GFRNONAA >60 >60 60*  ANIONGAP 9  --  8     Hematology Recent Labs  Lab 08/31/20 0926 08/31/20 1828 09/01/20 0241  WBC 9.5 10.1 8.8  RBC 4.50 4.54 4.33  HGB 13.2 13.2 12.6  HCT 41.4 41.6 39.3  MCV 92.0 91.6 90.8  MCH 29.3 29.1 29.1  MCHC 31.9 31.7 32.1  RDW 13.9 14.0 14.0  PLT 196 200 185  BNPNo results for input(s): BNP, PROBNP in the last 168 hours.   DDimer No results for input(s): DDIMER in the last 168 hours.   Radiology    CARDIAC CATHETERIZATION  Result Date: 08/31/2020   Total occlusion of SVG to RCA-PL.   Patent LIMA to LAD.   Patent SVG to OM.   85% heavily calcified distal left main.   85 to 90% proximal LAD before the first diagonal, followed by 100% occlusion in the mid vessel.   60 to 70% ostial to proximal circumflex.  Competitive flow was noted in the circumflex due to the widely patent saphenous vein graft.   Right coronary is dominant, heavily calcified, and tortuous.  In the proximal vessel within a right angle bend there is 50 to 70% obstruction with eccentric calcification.  The mid RCA contains focal eccentric 60 to 70% stenosis.   LV function is normal.  EF is 60%.  LVEDP is 12 mmHg. Plan: Medical therapy If recurring symptoms compatible with angina, consider therapy of the right coronary proximal and mid segment.  This would require plaque modification to get full stent expansion.  Would likely be difficult to perform due to tortuosity.  May be more appropriately managed from a femoral approach.  Discussed with Dr. Ellyn Hack.   DG Chest Portable 1 View  Result Date: 08/31/2020 CLINICAL DATA:   Chest pain EXAM: PORTABLE CHEST 1 VIEW COMPARISON:  None. FINDINGS: Sternotomy wires overlie normal cardiac silhouette. Normal pulmonary vasculature. No effusion, infiltrate, or pneumothorax. No acute osseous abnormality. IMPRESSION: No acute cardiopulmonary process. Electronically Signed   By: Suzy Bouchard M.D.   On: 08/31/2020 10:10   ECHOCARDIOGRAM COMPLETE  Result Date: 09/01/2020    ECHOCARDIOGRAM REPORT   Patient Name:   [name] Long Date of Exam: 09/01/2020 Medical Rec #:  [id number]    Height:       64.5 in Accession #:    [id number]   Weight:       176.9 lb Date of Birth:  Sep 11, 1940    BSA:          1.867 m Patient Age:    80 years     BP:           141/81 mmHg Patient Gender: F            HR:           78 bpm. Exam Location:  Inpatient Procedure: 2D Echo, Cardiac Doppler, Color Doppler and Intracardiac            Opacification Agent Indications:    R07.9* Chest pain, unspecified  History:        Patient has prior history of Echocardiogram examinations, most                 recent 11/20/2017. CAD, Previous Myocardial Infarction and Acute                 MI, Prior CABG, Signs/Symptoms:Chest Pain; Risk                 Factors:Dyslipidemia and Hypertension.  Sonographer:    Roseanna Rainbow RDCS Referring Phys: [place] Comments: Technically difficult study due to poor echo windows. IMPRESSIONS  1. Left ventricular ejection fraction, by estimation, is 55 to 60%. The left ventricle has normal function. The left ventricle has no regional wall motion abnormalities. There is mild concentric left ventricular hypertrophy. Left ventricular diastolic parameters are indeterminate.  2. Right ventricular systolic function  is low normal. The right ventricular size is normal.  3. The mitral valve is grossly normal. Trivial mitral valve regurgitation. No evidence of mitral stenosis.  4. The aortic valve has an indeterminant number of cusps. Aortic valve regurgitation is not visualized. No aortic  stenosis is present.  5. The inferior vena cava is normal in size with greater than 50% respiratory variability, suggesting right atrial pressure of 3 mmHg. Comparison(s): No significant change from prior study. Conclusion(s)/Recommendation(s): Normal biventricular function without evidence of hemodynamically significant valvular heart disease. FINDINGS  Left Ventricle: Left ventricular ejection fraction, by estimation, is 55 to 60%. The left ventricle has normal function. The left ventricle has no regional wall motion abnormalities. Definity contrast agent was given IV to delineate the left ventricular  endocardial borders. The left ventricular internal cavity size was normal in size. There is mild concentric left ventricular hypertrophy. Left ventricular diastolic parameters are indeterminate. Right Ventricle: The right ventricular size is normal. Right vetricular wall thickness was not well visualized. Right ventricular systolic function is low normal. Left Atrium: Left atrial size was normal in size. Right Atrium: Right atrial size was normal in size. Pericardium: There is no evidence of pericardial effusion. Mitral Valve: The mitral valve is grossly normal. There is mild thickening of the mitral valve leaflet(s). Trivial mitral valve regurgitation. No evidence of mitral valve stenosis. Tricuspid Valve: The tricuspid valve is normal in structure. Tricuspid valve regurgitation is trivial. No evidence of tricuspid stenosis. Aortic Valve: The aortic valve has an indeterminant number of cusps. Aortic valve regurgitation is not visualized. No aortic stenosis is present. Pulmonic Valve: The pulmonic valve was not well visualized. Pulmonic valve regurgitation is not visualized. Aorta: The aortic root, ascending aorta, aortic arch and descending aorta are all structurally normal, with no evidence of dilitation or obstruction. Venous: The inferior vena cava is normal in size with greater than 50% respiratory variability,  suggesting right atrial pressure of 3 mmHg. IAS/Shunts: The atrial septum is grossly normal.  LEFT VENTRICLE PLAX 2D LVIDd:         3.80 cm     Diastology LVIDs:         2.80 cm     LV e' medial:    4.79 cm/s LV PW:         1.60 cm     LV E/e' medial:  13.8 LV IVS:        1.30 cm     LV e' lateral:   5.66 cm/s LVOT diam:     1.90 cm     LV E/e' lateral: 11.7 LV SV:         39 LV SV Index:   21 LVOT Area:     2.84 cm  LV Volumes (MOD) LV vol d, MOD A2C: 55.1 ml LV vol d, MOD A4C: 75.3 ml LV vol s, MOD A2C: 17.9 ml LV vol s, MOD A4C: 38.6 ml LV SV MOD A2C:     37.2 ml LV SV MOD A4C:     75.3 ml LV SV MOD BP:      36.3 ml RIGHT VENTRICLE            IVC RV S prime:     5.98 cm/s  IVC diam: 1.20 cm TAPSE (M-mode): 1.1 cm LEFT ATRIUM           Index       RIGHT ATRIUM          Index LA diam:  2.90 cm 1.55 cm/m  RA Area:     8.28 cm LA Vol (A2C): 16.9 ml 9.05 ml/m  RA Volume:   13.60 ml 7.28 ml/m LA Vol (A4C): 20.6 ml 11.03 ml/m  AORTIC VALVE LVOT Vmax:   87.70 cm/s LVOT Vmean:  60.400 cm/s LVOT VTI:    0.139 m  AORTA Ao Root diam: 3.30 cm Ao Asc diam:  3.80 cm MITRAL VALVE MV Area (PHT): 2.48 cm    SHUNTS MV Decel Time: 306 msec    Systemic VTI:  0.14 m MV E velocity: 66.00 cm/s  Systemic Diam: 1.90 cm MV A velocity: 82.90 cm/s MV E/A ratio:  0.80 Buford Dresser MD Electronically signed by Buford Dresser MD Signature Date/Time: 09/01/2020/1:10:00 PM    Final     Cardiac Studies   Cath: 08/31/20   Total occlusion of SVG to RCA-PL.   Patent LIMA to LAD.   Patent SVG to OM.   85% heavily calcified distal left main.   85 to 90% proximal LAD before the first diagonal, followed by 100% occlusion in the mid vessel.   60 to 70% ostial to proximal circumflex.  Competitive flow was noted in the circumflex due to the widely patent saphenous vein graft.   Right coronary is dominant, heavily calcified, and tortuous.  In the proximal vessel within a right angle bend there is 50 to 70% obstruction with  eccentric calcification.  The mid RCA contains focal eccentric 60 to 70% stenosis.   LV function is normal.  EF is 60%.  LVEDP is 12 mmHg.   Plan: Medical therapy If recurring symptoms compatible with angina, consider therapy of the right coronary proximal and mid segment.  This would require plaque modification to get full stent expansion.  Would likely be difficult to perform due to tortuosity.  May be more appropriately managed from a femoral approach.  Discussed with Dr. Ellyn Hack.   Diagnostic Dominance: Right Echo: 09/01/20   IMPRESSIONS     1. Left ventricular ejection fraction, by estimation, is 55 to 60%. The  left ventricle has normal function. The left ventricle has no regional  wall motion abnormalities. There is mild concentric left ventricular  hypertrophy. Left ventricular diastolic  parameters are indeterminate.   2. Right ventricular systolic function is low normal. The right  ventricular size is normal.   3. The mitral valve is grossly normal. Trivial mitral valve  regurgitation. No evidence of mitral stenosis.   4. The aortic valve has an indeterminant number of cusps. Aortic valve  regurgitation is not visualized. No aortic stenosis is present.   5. The inferior vena cava is normal in size with greater than 50%  respiratory variability, suggesting right atrial pressure of 3 mmHg.   Comparison(s): No significant change from prior study.   Conclusion(s)/Recommendation(s): Normal biventricular function without  evidence of hemodynamically significant valvular heart disease.   FINDINGS   Left Ventricle: Left ventricular ejection fraction, by estimation, is 55  to 60%. The left ventricle has normal function. The left ventricle has no  regional wall motion abnormalities. Definity contrast agent was given IV  to delineate the left ventricular   endocardial borders. The left ventricular internal cavity size was normal  in size. There is mild concentric left ventricular  hypertrophy. Left  ventricular diastolic parameters are indeterminate.   Right Ventricle: The right ventricular size is normal. Right vetricular  wall thickness was not well visualized. Right ventricular systolic  function is low normal.   Left Atrium: Left  atrial size was normal in size.   Right Atrium: Right atrial size was normal in size.   Pericardium: There is no evidence of pericardial effusion.   Mitral Valve: The mitral valve is grossly normal. There is mild thickening  of the mitral valve leaflet(s). Trivial mitral valve regurgitation. No  evidence of mitral valve stenosis.   Tricuspid Valve: The tricuspid valve is normal in structure. Tricuspid  valve regurgitation is trivial. No evidence of tricuspid stenosis.   Aortic Valve: The aortic valve has an indeterminant number of cusps.  Aortic valve regurgitation is not visualized. No aortic stenosis is  present.   Pulmonic Valve: The pulmonic valve was not well visualized. Pulmonic valve  regurgitation is not visualized.   Aorta: The aortic root, ascending aorta, aortic arch and descending aorta  are all structurally normal, with no evidence of dilitation or  obstruction.   Venous: The inferior vena cava is normal in size with greater than 50%  respiratory variability, suggesting right atrial pressure of 3 mmHg.   IAS/Shunts: The atrial septum is grossly normal.   Patient Profile     80 y.o. female with a hx of CAD s/p CABG with LIMA to LAD, SVG to OM and RCA, post operative sternal wound infection, renal cell carcinoma s/p partial nephrectomy (follows with Dr. Gilford Rile), HTN, and HLD who was seen for chest pain.   Assessment & Plan    Unstable angina: Pt underwent CABG with LIMA to LAD, SVG to OM and RCA. Last ischemic evaluation with stress testing 2017 with normal stress test and echocardiogram. She presented to [facility]31[facility]20 with acute onset of anterior chest pain with radiation to left jaw and shoulder. hsTn were  essentially negative. Underwent cardiac cath noted above with patent LIMA to LAD, SVG to OM. Occluded SVG tp RCA-PL along with native 70% pRCA (heavily calcified) and 70% mRCA disease. Recommendations for medical therapy and if develops refractory angina then consider PCI of the RCA.  -- added Imdur '30mg'$  daily. Follow up echo showed EF of 55-60% with no rWMA.  -- continue ASA, statin, BB -- ambulated with RN and felt dizzy and short of breath, will plan to keep today and follow up tomorrow. If recurrent angina may need to consider PCI this admission.  Was feeling well at rest, but did not feel as well with walking.  Need to reassess.  Blood pressure has been somewhat labile.  In order to titrate antianginal medications I think is best to keep another day.  Low threshold to consider RCA PCI with likely new occlusion of the vein graft to the RCA.   HTN: Stable, 103/67>>102/89>>126/80 -- Continue home metoprolol   HLD: Last LDL, 52 from 05/17/2017 -- Continue statin    Hx of renal cell carcinoma s/p partial nephrectomy: Follows with Dr. Gilford Rile -- Creatinine stable at 0.96>>0.97  For questions or updates, please contact [facility] Please consult [email or web address] for contact info under        Signed, Reino Bellis, NP  09/01/2020, 2:17 PM      ATTENDING ATTESTATION  I have seen, examined and evaluated the patient this PM along with Reino Bellis, NP-C.  After reviewing all the available data and chart, we discussed the patients laboratory, study & physical findings as well as symptoms in detail. I agree with her findings, examination as well as impression recommendations as per our discussion.    Attending adjustments noted in italics.   She is actually doing pretty well this morning, but did  not do as well when she got ambulate.  We will need to reassess tomorrow on current medications and see how she does.  Low threshold to consider PCI.  Otherwise I agree with the note  above.Glenetta Hew, M.D., M.S. Interventional Cardiologist   Pager # [phone] Phone # [phone] [address] [place], [place] [address]

## 2020-09-01 NOTE — H&P (View-Only) (Signed)
Progress Note  Patient Name: [name] Long Date of Encounter: 09/01/2020  [facility] Cardiologist: Quay Burow, MD   Subjective   Seen this morning. No chest pain. Some shortness of breath. Was able to ambulate in the hallway but felt dizzy and short of breath.   Inpatient Medications    Scheduled Meds:  aspirin EC  81 mg Oral Daily   atorvastatin  80 mg Oral Daily   gabapentin  1,200 mg Oral Daily   gabapentin  1,800 mg Oral QHS   heparin  5,000 Units Subcutaneous Q8H   isosorbide mononitrate  30 mg Oral Daily   metoprolol tartrate  25 mg Oral BID   montelukast  10 mg Oral Daily   sodium chloride flush  3 mL Intravenous Q12H   sodium chloride flush  3 mL Intravenous Q12H   tamsulosin  0.4 mg Oral Daily   Continuous Infusions:  sodium chloride     PRN Meds: sodium chloride, acetaminophen, nitroGLYCERIN, ondansetron (ZOFRAN) IV, sodium chloride flush   Vital Signs    Vitals:   09/01/20 1323 09/01/20 1329 09/01/20 1404 09/01/20 1407  BP: (!) 97/57 106/62 (!) 88/52 (!) 90/56  Pulse:  74 73   Resp:  18 20   Temp:      TempSrc:      SpO2:      Weight:      Height:        Intake/Output Summary (Last 24 hours) at 09/01/2020 1417 Last data filed at 09/01/2020 1200 Gross per 24 hour  Intake 984.06 ml  Output --  Net 984.06 ml   Last 3 Weights 09/01/2020 08/11/2020 05/25/2020  Weight (lbs) 176 lb 14.4 oz 183 lb 170 lb  Weight (kg) 80.241 kg 83.008 kg 77.111 kg      Telemetry    SR - Personally Reviewed  ECG    SR with deep TWI in anterolateral leads  Physical Exam   GEN: No acute distress.   Neck: No JVD Cardiac: RRR, no murmurs, rubs, or gallops.  Respiratory: Clear to auscultation bilaterally. GI: Soft, nontender, non-distended  MS: No edema; No deformity. Left radial cath site stable.  Neuro:  Nonfocal  Psych: Normal affect   Labs    High Sensitivity Troponin:   Recent Labs  Lab 08/31/20 0926 08/31/20 1126  TROPONINIHS 9 8       Chemistry Recent Labs  Lab 08/31/20 0926 08/31/20 1828 09/01/20 0241  NA 139  --  140  K 4.3  --  4.5  CL 104  --  105  CO2 26  --  27  GLUCOSE 115*  --  97  BUN 23  --  24*  CREATININE 0.96 0.92 0.97  CALCIUM 8.6*  --  8.8*  PROT 6.8  --   --   ALBUMIN 3.9  --   --   AST 18  --   --   ALT 17  --   --   ALKPHOS 103  --   --   BILITOT 0.5  --   --   GFRNONAA >60 >60 60*  ANIONGAP 9  --  8     Hematology Recent Labs  Lab 08/31/20 0926 08/31/20 1828 09/01/20 0241  WBC 9.5 10.1 8.8  RBC 4.50 4.54 4.33  HGB 13.2 13.2 12.6  HCT 41.4 41.6 39.3  MCV 92.0 91.6 90.8  MCH 29.3 29.1 29.1  MCHC 31.9 31.7 32.1  RDW 13.9 14.0 14.0  PLT 196 200 185  BNPNo results for input(s): BNP, PROBNP in the last 168 hours.   DDimer No results for input(s): DDIMER in the last 168 hours.   Radiology    CARDIAC CATHETERIZATION  Result Date: 08/31/2020   Total occlusion of SVG to RCA-PL.   Patent LIMA to LAD.   Patent SVG to OM.   85% heavily calcified distal left main.   85 to 90% proximal LAD before the first diagonal, followed by 100% occlusion in the mid vessel.   60 to 70% ostial to proximal circumflex.  Competitive flow was noted in the circumflex due to the widely patent saphenous vein graft.   Right coronary is dominant, heavily calcified, and tortuous.  In the proximal vessel within a right angle bend there is 50 to 70% obstruction with eccentric calcification.  The mid RCA contains focal eccentric 60 to 70% stenosis.   LV function is normal.  EF is 60%.  LVEDP is 12 mmHg. Plan: Medical therapy If recurring symptoms compatible with angina, consider therapy of the right coronary proximal and mid segment.  This would require plaque modification to get full stent expansion.  Would likely be difficult to perform due to tortuosity.  May be more appropriately managed from a femoral approach.  Discussed with Dr. Ellyn Hack.   DG Chest Portable 1 View  Result Date: 08/31/2020 CLINICAL DATA:   Chest pain EXAM: PORTABLE CHEST 1 VIEW COMPARISON:  None. FINDINGS: Sternotomy wires overlie normal cardiac silhouette. Normal pulmonary vasculature. No effusion, infiltrate, or pneumothorax. No acute osseous abnormality. IMPRESSION: No acute cardiopulmonary process. Electronically Signed   By: Suzy Bouchard M.D.   On: 08/31/2020 10:10   ECHOCARDIOGRAM COMPLETE  Result Date: 09/01/2020    ECHOCARDIOGRAM REPORT   Patient Name:   [name] Long Date of Exam: 09/01/2020 Medical Rec #:  [id number]    Height:       64.5 in Accession #:    [id number]   Weight:       176.9 lb Date of Birth:  1940-10-04    BSA:          1.867 m Patient Age:    80 years     BP:           141/81 mmHg Patient Gender: F            HR:           78 bpm. Exam Location:  Inpatient Procedure: 2D Echo, Cardiac Doppler, Color Doppler and Intracardiac            Opacification Agent Indications:    R07.9* Chest pain, unspecified  History:        Patient has prior history of Echocardiogram examinations, most                 recent 11/20/2017. CAD, Previous Myocardial Infarction and Acute                 MI, Prior CABG, Signs/Symptoms:Chest Pain; Risk                 Factors:Dyslipidemia and Hypertension.  Sonographer:    Roseanna Rainbow RDCS Referring Phys: [employer] Comments: Technically difficult study due to poor echo windows. IMPRESSIONS  1. Left ventricular ejection fraction, by estimation, is 55 to 60%. The left ventricle has normal function. The left ventricle has no regional wall motion abnormalities. There is mild concentric left ventricular hypertrophy. Left ventricular diastolic parameters are indeterminate.  2. Right ventricular systolic function  is low normal. The right ventricular size is normal.  3. The mitral valve is grossly normal. Trivial mitral valve regurgitation. No evidence of mitral stenosis.  4. The aortic valve has an indeterminant number of cusps. Aortic valve regurgitation is not visualized. No aortic  stenosis is present.  5. The inferior vena cava is normal in size with greater than 50% respiratory variability, suggesting right atrial pressure of 3 mmHg. Comparison(s): No significant change from prior study. Conclusion(s)/Recommendation(s): Normal biventricular function without evidence of hemodynamically significant valvular heart disease. FINDINGS  Left Ventricle: Left ventricular ejection fraction, by estimation, is 55 to 60%. The left ventricle has normal function. The left ventricle has no regional wall motion abnormalities. Definity contrast agent was given IV to delineate the left ventricular  endocardial borders. The left ventricular internal cavity size was normal in size. There is mild concentric left ventricular hypertrophy. Left ventricular diastolic parameters are indeterminate. Right Ventricle: The right ventricular size is normal. Right vetricular wall thickness was not well visualized. Right ventricular systolic function is low normal. Left Atrium: Left atrial size was normal in size. Right Atrium: Right atrial size was normal in size. Pericardium: There is no evidence of pericardial effusion. Mitral Valve: The mitral valve is grossly normal. There is mild thickening of the mitral valve leaflet(s). Trivial mitral valve regurgitation. No evidence of mitral valve stenosis. Tricuspid Valve: The tricuspid valve is normal in structure. Tricuspid valve regurgitation is trivial. No evidence of tricuspid stenosis. Aortic Valve: The aortic valve has an indeterminant number of cusps. Aortic valve regurgitation is not visualized. No aortic stenosis is present. Pulmonic Valve: The pulmonic valve was not well visualized. Pulmonic valve regurgitation is not visualized. Aorta: The aortic root, ascending aorta, aortic arch and descending aorta are all structurally normal, with no evidence of dilitation or obstruction. Venous: The inferior vena cava is normal in size with greater than 50% respiratory variability,  suggesting right atrial pressure of 3 mmHg. IAS/Shunts: The atrial septum is grossly normal.  LEFT VENTRICLE PLAX 2D LVIDd:         3.80 cm     Diastology LVIDs:         2.80 cm     LV e' medial:    4.79 cm/s LV PW:         1.60 cm     LV E/e' medial:  13.8 LV IVS:        1.30 cm     LV e' lateral:   5.66 cm/s LVOT diam:     1.90 cm     LV E/e' lateral: 11.7 LV SV:         39 LV SV Index:   21 LVOT Area:     2.84 cm  LV Volumes (MOD) LV vol d, MOD A2C: 55.1 ml LV vol d, MOD A4C: 75.3 ml LV vol s, MOD A2C: 17.9 ml LV vol s, MOD A4C: 38.6 ml LV SV MOD A2C:     37.2 ml LV SV MOD A4C:     75.3 ml LV SV MOD BP:      36.3 ml RIGHT VENTRICLE            IVC RV S prime:     5.98 cm/s  IVC diam: 1.20 cm TAPSE (M-mode): 1.1 cm LEFT ATRIUM           Index       RIGHT ATRIUM          Index LA diam:  2.90 cm 1.55 cm/m  RA Area:     8.28 cm LA Vol (A2C): 16.9 ml 9.05 ml/m  RA Volume:   13.60 ml 7.28 ml/m LA Vol (A4C): 20.6 ml 11.03 ml/m  AORTIC VALVE LVOT Vmax:   87.70 cm/s LVOT Vmean:  60.400 cm/s LVOT VTI:    0.139 m  AORTA Ao Root diam: 3.30 cm Ao Asc diam:  3.80 cm MITRAL VALVE MV Area (PHT): 2.48 cm    SHUNTS MV Decel Time: 306 msec    Systemic VTI:  0.14 m MV E velocity: 66.00 cm/s  Systemic Diam: 1.90 cm MV A velocity: 82.90 cm/s MV E/A ratio:  0.80 Buford Dresser MD Electronically signed by Buford Dresser MD Signature Date/Time: 09/01/2020/1:10:00 PM    Final     Cardiac Studies   Cath: 08/31/20   Total occlusion of SVG to RCA-PL.   Patent LIMA to LAD.   Patent SVG to OM.   85% heavily calcified distal left main.   85 to 90% proximal LAD before the first diagonal, followed by 100% occlusion in the mid vessel.   60 to 70% ostial to proximal circumflex.  Competitive flow was noted in the circumflex due to the widely patent saphenous vein graft.   Right coronary is dominant, heavily calcified, and tortuous.  In the proximal vessel within a right angle bend there is 50 to 70% obstruction with  eccentric calcification.  The mid RCA contains focal eccentric 60 to 70% stenosis.   LV function is normal.  EF is 60%.  LVEDP is 12 mmHg.   Plan: Medical therapy If recurring symptoms compatible with angina, consider therapy of the right coronary proximal and mid segment.  This would require plaque modification to get full stent expansion.  Would likely be difficult to perform due to tortuosity.  May be more appropriately managed from a femoral approach.  Discussed with Dr. Ellyn Hack.   Diagnostic Dominance: Right Echo: 09/01/20   IMPRESSIONS     1. Left ventricular ejection fraction, by estimation, is 55 to 60%. The  left ventricle has normal function. The left ventricle has no regional  wall motion abnormalities. There is mild concentric left ventricular  hypertrophy. Left ventricular diastolic  parameters are indeterminate.   2. Right ventricular systolic function is low normal. The right  ventricular size is normal.   3. The mitral valve is grossly normal. Trivial mitral valve  regurgitation. No evidence of mitral stenosis.   4. The aortic valve has an indeterminant number of cusps. Aortic valve  regurgitation is not visualized. No aortic stenosis is present.   5. The inferior vena cava is normal in size with greater than 50%  respiratory variability, suggesting right atrial pressure of 3 mmHg.   Comparison(s): No significant change from prior study.   Conclusion(s)/Recommendation(s): Normal biventricular function without  evidence of hemodynamically significant valvular heart disease.   FINDINGS   Left Ventricle: Left ventricular ejection fraction, by estimation, is 55  to 60%. The left ventricle has normal function. The left ventricle has no  regional wall motion abnormalities. Definity contrast agent was given IV  to delineate the left ventricular   endocardial borders. The left ventricular internal cavity size was normal  in size. There is mild concentric left ventricular  hypertrophy. Left  ventricular diastolic parameters are indeterminate.   Right Ventricle: The right ventricular size is normal. Right vetricular  wall thickness was not well visualized. Right ventricular systolic  function is low normal.   Left Atrium: Left  atrial size was normal in size.   Right Atrium: Right atrial size was normal in size.   Pericardium: There is no evidence of pericardial effusion.   Mitral Valve: The mitral valve is grossly normal. There is mild thickening  of the mitral valve leaflet(s). Trivial mitral valve regurgitation. No  evidence of mitral valve stenosis.   Tricuspid Valve: The tricuspid valve is normal in structure. Tricuspid  valve regurgitation is trivial. No evidence of tricuspid stenosis.   Aortic Valve: The aortic valve has an indeterminant number of cusps.  Aortic valve regurgitation is not visualized. No aortic stenosis is  present.   Pulmonic Valve: The pulmonic valve was not well visualized. Pulmonic valve  regurgitation is not visualized.   Aorta: The aortic root, ascending aorta, aortic arch and descending aorta  are all structurally normal, with no evidence of dilitation or  obstruction.   Venous: The inferior vena cava is normal in size with greater than 50%  respiratory variability, suggesting right atrial pressure of 3 mmHg.   IAS/Shunts: The atrial septum is grossly normal.   Patient Profile     80 y.o. female with a hx of CAD s/p CABG with LIMA to LAD, SVG to OM and RCA, post operative sternal wound infection, renal cell carcinoma s/p partial nephrectomy (follows with Dr. Gilford Rile), HTN, and HLD who was seen for chest pain.   Assessment & Plan    Unstable angina: Pt underwent CABG with LIMA to LAD, SVG to OM and RCA. Last ischemic evaluation with stress testing 2017 with normal stress test and echocardiogram. She presented to [facility]31[facility]20 with acute onset of anterior chest pain with radiation to left jaw and shoulder. hsTn were  essentially negative. Underwent cardiac cath noted above with patent LIMA to LAD, SVG to OM. Occluded SVG tp RCA-PL along with native 70% pRCA (heavily calcified) and 70% mRCA disease. Recommendations for medical therapy and if develops refractory angina then consider PCI of the RCA.  -- added Imdur '30mg'$  daily. Follow up echo showed EF of 55-60% with no rWMA.  -- continue ASA, statin, BB -- ambulated with RN and felt dizzy and short of breath, will plan to keep today and follow up tomorrow. If recurrent angina may need to consider PCI this admission.  Was feeling well at rest, but did not feel as well with walking.  Need to reassess.  Blood pressure has been somewhat labile.  In order to titrate antianginal medications I think is best to keep another day.  Low threshold to consider RCA PCI with likely new occlusion of the vein graft to the RCA.   HTN: Stable, 103/67>>102/89>>126/80 -- Continue home metoprolol   HLD: Last LDL, 52 from 05/17/2017 -- Continue statin    Hx of renal cell carcinoma s/p partial nephrectomy: Follows with Dr. Gilford Rile -- Creatinine stable at 0.96>>0.97  For questions or updates, please contact [facility] Please consult [email or web address] for contact info under        Signed, Reino Bellis, NP  09/01/2020, 2:17 PM      ATTENDING ATTESTATION  I have seen, examined and evaluated the patient this PM along with Reino Bellis, NP-C.  After reviewing all the available data and chart, we discussed the patients laboratory, study & physical findings as well as symptoms in detail. I agree with her findings, examination as well as impression recommendations as per our discussion.    Attending adjustments noted in italics.   She is actually doing pretty well this morning, but did  not do as well when she got ambulate.  We will need to reassess tomorrow on current medications and see how she does.  Low threshold to consider PCI.  Otherwise I agree with the note  above.Glenetta Hew, M.D., M.S. Interventional Cardiologist   Pager # [phone] Phone # [phone] [address]. [place], [place] [address]

## 2020-09-01 NOTE — Progress Notes (Addendum)
Patient ambulated in hall with husband.  C/o feeling dizzy and SOB during ambulation.  BP 104/62 after returning to bed. Oxygen saturaion 96% on RA.  Patient states she feels weak and drained, repeat BP 97/57. NP Mancel Bale text paged. EKG being done.

## 2020-09-01 NOTE — Progress Notes (Signed)
Patient continues to c/o dizzines. BP 90/56, HR 74.  NP Mancel Bale notified.

## 2020-09-01 NOTE — Progress Notes (Signed)
Patient given tylenol and ice pack per request for headache at 1641hrs.  Continues to c/o headache, now rating 10/10, states she gets these headaches at home.  States she takes Manufacturing [employer] '350mg'$  and dilaudid '4mg'$  po at home. NP Mancel Bale notified.

## 2020-09-02 ENCOUNTER — Other Ambulatory Visit (HOSPITAL_COMMUNITY): Payer: Self-pay

## 2020-09-02 ENCOUNTER — Encounter (HOSPITAL_COMMUNITY)
Admission: EM | Disposition: A | Discharge status: Home / Self Care | Payer: Self-pay | Source: Home / Self Care | Attending: Cardiology

## 2020-09-02 DIAGNOSIS — R0789 Other chest pain: Secondary | ICD-10-CM | POA: Diagnosis present

## 2020-09-02 DIAGNOSIS — Z90710 Acquired absence of both cervix and uterus: Secondary | ICD-10-CM | POA: Diagnosis not present

## 2020-09-02 DIAGNOSIS — Z96651 Presence of right artificial knee joint: Secondary | ICD-10-CM | POA: Diagnosis present

## 2020-09-02 DIAGNOSIS — Z79899 Other long term (current) drug therapy: Secondary | ICD-10-CM | POA: Diagnosis not present

## 2020-09-02 DIAGNOSIS — E782 Mixed hyperlipidemia: Secondary | ICD-10-CM | POA: Diagnosis not present

## 2020-09-02 DIAGNOSIS — I2510 Atherosclerotic heart disease of native coronary artery without angina pectoris: Secondary | ICD-10-CM | POA: Diagnosis not present

## 2020-09-02 DIAGNOSIS — I10 Essential (primary) hypertension: Secondary | ICD-10-CM | POA: Diagnosis present

## 2020-09-02 DIAGNOSIS — J45909 Unspecified asthma, uncomplicated: Secondary | ICD-10-CM | POA: Diagnosis present

## 2020-09-02 DIAGNOSIS — I200 Unstable angina: Secondary | ICD-10-CM | POA: Diagnosis not present

## 2020-09-02 DIAGNOSIS — Z905 Acquired absence of kidney: Secondary | ICD-10-CM | POA: Diagnosis not present

## 2020-09-02 DIAGNOSIS — Z85528 Personal history of other malignant neoplasm of kidney: Secondary | ICD-10-CM | POA: Diagnosis not present

## 2020-09-02 DIAGNOSIS — Z87891 Personal history of nicotine dependence: Secondary | ICD-10-CM | POA: Diagnosis not present

## 2020-09-02 DIAGNOSIS — Z888 Allergy status to other drugs, medicaments and biological substances status: Secondary | ICD-10-CM | POA: Diagnosis not present

## 2020-09-02 DIAGNOSIS — Z951 Presence of aortocoronary bypass graft: Secondary | ICD-10-CM | POA: Diagnosis not present

## 2020-09-02 DIAGNOSIS — I25110 Atherosclerotic heart disease of native coronary artery with unstable angina pectoris: Secondary | ICD-10-CM | POA: Diagnosis present

## 2020-09-02 DIAGNOSIS — K219 Gastro-esophageal reflux disease without esophagitis: Secondary | ICD-10-CM | POA: Diagnosis present

## 2020-09-02 DIAGNOSIS — Z8249 Family history of ischemic heart disease and other diseases of the circulatory system: Secondary | ICD-10-CM | POA: Diagnosis not present

## 2020-09-02 DIAGNOSIS — Z20822 Contact with and (suspected) exposure to covid-19: Secondary | ICD-10-CM | POA: Diagnosis present

## 2020-09-02 DIAGNOSIS — E785 Hyperlipidemia, unspecified: Secondary | ICD-10-CM | POA: Diagnosis present

## 2020-09-02 DIAGNOSIS — Z885 Allergy status to narcotic agent status: Secondary | ICD-10-CM | POA: Diagnosis not present

## 2020-09-02 DIAGNOSIS — Z7951 Long term (current) use of inhaled steroids: Secondary | ICD-10-CM | POA: Diagnosis not present

## 2020-09-02 DIAGNOSIS — R079 Chest pain, unspecified: Secondary | ICD-10-CM | POA: Insufficient documentation

## 2020-09-02 DIAGNOSIS — I25700 Atherosclerosis of coronary artery bypass graft(s), unspecified, with unstable angina pectoris: Secondary | ICD-10-CM | POA: Diagnosis present

## 2020-09-02 DIAGNOSIS — M797 Fibromyalgia: Secondary | ICD-10-CM | POA: Diagnosis present

## 2020-09-02 DIAGNOSIS — M069 Rheumatoid arthritis, unspecified: Secondary | ICD-10-CM | POA: Diagnosis present

## 2020-09-02 DIAGNOSIS — Z981 Arthrodesis status: Secondary | ICD-10-CM | POA: Diagnosis not present

## 2020-09-02 HISTORY — PX: PERIPHERAL INTRAVASCULAR LITHOTRIPSY: CATH118324

## 2020-09-02 HISTORY — PX: CORONARY ULTRASOUND/IVUS: CATH118244

## 2020-09-02 HISTORY — PX: CORONARY ATHERECTOMY: CATH118238

## 2020-09-02 HISTORY — PX: CORONARY STENT INTERVENTION: CATH118234

## 2020-09-02 LAB — POCT ACTIVATED CLOTTING TIME
Activated Clotting Time: 0 seconds
Activated Clotting Time: 289 seconds
Activated Clotting Time: 312 seconds

## 2020-09-02 SURGERY — CORONARY STENT INTERVENTION
Anesthesia: LOCAL

## 2020-09-02 MED ORDER — ENOXAPARIN SODIUM 40 MG/0.4ML IJ SOSY
40.0000 mg | PREFILLED_SYRINGE | INTRAMUSCULAR | Status: DC
  Administered 2020-09-03: 40 mg via SUBCUTANEOUS
  Filled 2020-09-02: qty 0.4

## 2020-09-02 MED ORDER — MIDAZOLAM HCL 2 MG/2ML IJ SOLN
INTRAMUSCULAR | Status: DC | PRN
  Administered 2020-09-02: 1 mg via INTRAVENOUS
  Administered 2020-09-02: 2 mg via INTRAVENOUS

## 2020-09-02 MED ORDER — HEPARIN (PORCINE) IN NACL 1000-0.9 UT/500ML-% IV SOLN
INTRAVENOUS | Status: DC | PRN
  Administered 2020-09-02 (×2): 500 mL

## 2020-09-02 MED ORDER — SODIUM CHLORIDE 0.9% FLUSH: 3.0000 mL | INTRAVENOUS | Status: DC | PRN

## 2020-09-02 MED ORDER — SODIUM CHLORIDE 0.9% FLUSH
3.0000 mL | Freq: Two times a day (BID) | INTRAVENOUS | Status: DC
  Administered 2020-09-02: 3 mL via INTRAVENOUS

## 2020-09-02 MED ORDER — HYDRALAZINE HCL 20 MG/ML IJ SOLN: 10.0000 mg | INTRAMUSCULAR | Status: AC | PRN

## 2020-09-02 MED ORDER — VERAPAMIL HCL 2.5 MG/ML IV SOLN
INTRAVENOUS | Status: DC | PRN
  Administered 2020-09-02: 10 mL via INTRA_ARTERIAL

## 2020-09-02 MED ORDER — FENTANYL CITRATE (PF) 100 MCG/2ML IJ SOLN
INTRAMUSCULAR | Status: DC | PRN
  Administered 2020-09-02 (×3): 25 ug via INTRAVENOUS

## 2020-09-02 MED ORDER — SODIUM CHLORIDE 0.9 % IV SOLN: 250.0000 mL | INTRAVENOUS | Status: DC | PRN

## 2020-09-02 MED ORDER — LIDOCAINE HCL (PF) 1 % IJ SOLN
INTRAMUSCULAR | Status: AC
  Filled 2020-09-02: qty 30

## 2020-09-02 MED ORDER — CLOPIDOGREL BISULFATE 75 MG PO TABS
600.0000 mg | ORAL_TABLET | Freq: Every day | ORAL | Status: DC
  Administered 2020-09-02: 600 mg via ORAL
  Filled 2020-09-02: qty 8

## 2020-09-02 MED ORDER — MIDAZOLAM HCL 2 MG/2ML IJ SOLN
INTRAMUSCULAR | Status: AC
  Filled 2020-09-02: qty 2

## 2020-09-02 MED ORDER — HEPARIN (PORCINE) IN NACL 1000-0.9 UT/500ML-% IV SOLN
INTRAVENOUS | Status: AC
  Filled 2020-09-02: qty 1000

## 2020-09-02 MED ORDER — SODIUM CHLORIDE 0.9 % WEIGHT BASED INFUSION
3.0000 mL/kg/h | INTRAVENOUS | Status: DC
  Administered 2020-09-02: 3 mL/kg/h via INTRAVENOUS

## 2020-09-02 MED ORDER — LIDOCAINE HCL (PF) 1 % IJ SOLN
INTRAMUSCULAR | Status: DC | PRN
  Administered 2020-09-02: 2 mL

## 2020-09-02 MED ORDER — SODIUM CHLORIDE 0.9 % WEIGHT BASED INFUSION: 1.0000 mL/kg/h | INTRAVENOUS | Status: DC

## 2020-09-02 MED ORDER — NITROGLYCERIN 1 MG/10 ML FOR IR/CATH LAB
INTRA_ARTERIAL | Status: DC | PRN
  Administered 2020-09-02 (×2): 200 ug via INTRACORONARY

## 2020-09-02 MED ORDER — VERAPAMIL HCL 2.5 MG/ML IV SOLN
INTRAVENOUS | Status: AC
  Filled 2020-09-02: qty 2

## 2020-09-02 MED ORDER — IOHEXOL 350 MG/ML SOLN
INTRAVENOUS | Status: DC | PRN
  Administered 2020-09-02: 95 mL

## 2020-09-02 MED ORDER — HEPARIN SODIUM (PORCINE) 1000 UNIT/ML IJ SOLN
INTRAMUSCULAR | Status: DC | PRN
  Administered 2020-09-02: 8000 [IU] via INTRAVENOUS

## 2020-09-02 MED ORDER — VIPERSLIDE LUBRICANT OPTIME: TOPICAL | Status: DC | PRN

## 2020-09-02 MED ORDER — SODIUM CHLORIDE 0.9 % WEIGHT BASED INFUSION: 1.0000 mL/kg/h | INTRAVENOUS | Status: AC

## 2020-09-02 MED ORDER — FENTANYL CITRATE (PF) 100 MCG/2ML IJ SOLN
INTRAMUSCULAR | Status: AC
  Filled 2020-09-02: qty 2

## 2020-09-02 MED ORDER — NITROGLYCERIN 1 MG/10 ML FOR IR/CATH LAB
INTRA_ARTERIAL | Status: AC
  Filled 2020-09-02: qty 10

## 2020-09-02 MED ORDER — SODIUM CHLORIDE 0.9% FLUSH: 3.0000 mL | Freq: Two times a day (BID) | INTRAVENOUS | Status: DC

## 2020-09-02 MED ORDER — CLOPIDOGREL BISULFATE 75 MG PO TABS
75.0000 mg | ORAL_TABLET | Freq: Every day | ORAL | Status: DC
  Administered 2020-09-03: 75 mg via ORAL
  Filled 2020-09-02: qty 1

## 2020-09-02 MED ORDER — HEPARIN SODIUM (PORCINE) 1000 UNIT/ML IJ SOLN
INTRAMUSCULAR | Status: AC
  Filled 2020-09-02: qty 1

## 2020-09-02 SURGICAL SUPPLY — 27 items
BALLN SAPPHIRE 2.5X12 (BALLOONS) ×3
BALLN SAPPHIRE ~~LOC~~ 2.75X12 (BALLOONS) ×1 IMPLANT
BALLN SAPPHIRE ~~LOC~~ 3.25X15 (BALLOONS) ×1 IMPLANT
BALLOON SAPPHIRE 2.5X12 (BALLOONS) IMPLANT
CATH LAUNCHER 6FR AL.75 (CATHETERS) ×1 IMPLANT
CATH OPTICROSS HD (CATHETERS) ×1 IMPLANT
CATH SHOCKWAVE 2.5X12 (CATHETERS) IMPLANT
CATH TELEPORT (CATHETERS) ×1 IMPLANT
CATHETER SHOCKWAVE 2.5X12 (CATHETERS) ×3
COVER SWIFTLINK CONNECTOR (BAG) ×1 IMPLANT
CROWN DIAMONDBACK CLASSIC 1.25 (BURR) ×1 IMPLANT
DEVICE RAD COMP TR BAND LRG (VASCULAR PRODUCTS) ×1 IMPLANT
GLIDESHEATH SLEND SS 6F .021 (SHEATH) ×1 IMPLANT
GUIDEWIRE INQWIRE 1.5J.035X260 (WIRE) IMPLANT
INQWIRE 1.5J .035X260CM (WIRE) ×3
KIT ENCORE 26 ADVANTAGE (KITS) ×1 IMPLANT
KIT HEART LEFT (KITS) ×3 IMPLANT
LUBRICANT VIPERSLIDE CORONARY (MISCELLANEOUS) ×1 IMPLANT
PACK CARDIAC CATHETERIZATION (CUSTOM PROCEDURE TRAY) ×3 IMPLANT
SLED PULL BACK IVUS (MISCELLANEOUS) ×1 IMPLANT
STENT SYNERGY XD 3.0X24 (Permanent Stent) IMPLANT
SYNERGY XD 3.0X24 (Permanent Stent) ×3 IMPLANT
TRANSDUCER W/STOPCOCK (MISCELLANEOUS) ×3 IMPLANT
TUBING CIL FLEX 10 FLL-RA (TUBING) ×3 IMPLANT
WIRE ASAHI PROWATER 180CM (WIRE) ×1 IMPLANT
WIRE GUIDE ASAHI EXTENSION 165 (WIRE) ×1 IMPLANT
WIRE VIPERWIRE COR FLEX .012 (WIRE) ×1 IMPLANT

## 2020-09-02 NOTE — Plan of Care (Signed)
  Problem: Education: Goal: Understanding of CV disease, CV risk reduction, and recovery process will improve Outcome: Progressing   Problem: Cardiovascular: Goal: Vascular access site(s) Level 0-1 will be maintained Outcome: Progressing

## 2020-09-02 NOTE — Progress Notes (Addendum)
Progress Note  Patient Name: [name] Long Date of Encounter: 09/02/2020  [facility] Cardiologist: Quay Burow, MD   Subjective   Bad headache yesterday afternoon. Still with a heavy sensation in her chest, was short of breath as well when walking yesterday.   Inpatient Medications    Scheduled Meds:  aspirin EC  81 mg Oral Daily   atorvastatin  80 mg Oral Daily   gabapentin  1,200 mg Oral Daily   gabapentin  1,800 mg Oral QHS   heparin  5,000 Units Subcutaneous Q8H   metoprolol tartrate  25 mg Oral BID   montelukast  10 mg Oral Daily   sodium chloride flush  3 mL Intravenous Q12H   sodium chloride flush  3 mL Intravenous Q12H   tamsulosin  0.4 mg Oral Daily   Continuous Infusions:  sodium chloride     PRN Meds: sodium chloride, acetaminophen, nitroGLYCERIN, ondansetron (ZOFRAN) IV, sodium chloride flush   Vital Signs    Vitals:   09/01/20 2002 09/01/20 2159 09/02/20 0500 09/02/20 0750  BP: 108/66 102/65 111/67 108/65  Pulse:  73 80   Resp: 19  18   Temp: 97.6 F (36.4 C)  97.6 F (36.4 C)   TempSrc: Oral  Oral   SpO2: 96%  99%   Weight:   80.5 kg   Height:        Intake/Output Summary (Last 24 hours) at 09/02/2020 0854 Last data filed at 09/01/2020 2200 Gross per 24 hour  Intake 960 ml  Output --  Net 960 ml   Last 3 Weights 09/02/2020 09/01/2020 08/11/2020  Weight (lbs) 177 lb 8 oz 176 lb 14.4 oz 183 lb  Weight (kg) 80.513 kg 80.241 kg 83.008 kg      Telemetry    SR - Personally Reviewed  ECG    SR with continued TWI in anterolateral leads - Personally Reviewed  Physical Exam   GEN: No acute distress.   Neck: No JVD Cardiac: RRR, no murmurs, rubs, or gallops.  Respiratory: Clear to auscultation bilaterally. GI: Soft, nontender, non-distended  MS: No edema; No deformity. Neuro:  Nonfocal  Psych: Normal affect   Labs    High Sensitivity Troponin:   Recent Labs  Lab 08/31/20 0926 08/31/20 1126  TROPONINIHS 9 8       Chemistry Recent Labs  Lab 08/31/20 0926 08/31/20 1828 09/01/20 0241  NA 139  --  140  K 4.3  --  4.5  CL 104  --  105  CO2 26  --  27  GLUCOSE 115*  --  97  BUN 23  --  24*  CREATININE 0.96 0.92 0.97  CALCIUM 8.6*  --  8.8*  PROT 6.8  --   --   ALBUMIN 3.9  --   --   AST 18  --   --   ALT 17  --   --   ALKPHOS 103  --   --   BILITOT 0.5  --   --   GFRNONAA >60 >60 60*  ANIONGAP 9  --  8     Hematology Recent Labs  Lab 08/31/20 0926 08/31/20 1828 09/01/20 0241  WBC 9.5 10.1 8.8  RBC 4.50 4.54 4.33  HGB 13.2 13.2 12.6  HCT 41.4 41.6 39.3  MCV 92.0 91.6 90.8  MCH 29.3 29.1 29.1  MCHC 31.9 31.7 32.1  RDW 13.9 14.0 14.0  PLT 196 200 185    BNPNo results for input(s): BNP, PROBNP in  the last 168 hours.   DDimer No results for input(s): DDIMER in the last 168 hours.   Radiology    CARDIAC CATHETERIZATION  Result Date: 08/31/2020   Total occlusion of SVG to RCA-PL.   Patent LIMA to LAD.   Patent SVG to OM.   85% heavily calcified distal left main.   85 to 90% proximal LAD before the first diagonal, followed by 100% occlusion in the mid vessel.   60 to 70% ostial to proximal circumflex.  Competitive flow was noted in the circumflex due to the widely patent saphenous vein graft.   Right coronary is dominant, heavily calcified, and tortuous.  In the proximal vessel within a right angle bend there is 50 to 70% obstruction with eccentric calcification.  The mid RCA contains focal eccentric 60 to 70% stenosis.   LV function is normal.  EF is 60%.  LVEDP is 12 mmHg. Plan: Medical therapy If recurring symptoms compatible with angina, consider therapy of the right coronary proximal and mid segment.  This would require plaque modification to get full stent expansion.  Would likely be difficult to perform due to tortuosity.  May be more appropriately managed from a femoral approach.  Discussed with Dr. Ellyn Hack.   DG Chest Portable 1 View  Result Date: 08/31/2020 CLINICAL DATA:   Chest pain EXAM: PORTABLE CHEST 1 VIEW COMPARISON:  None. FINDINGS: Sternotomy wires overlie normal cardiac silhouette. Normal pulmonary vasculature. No effusion, infiltrate, or pneumothorax. No acute osseous abnormality. IMPRESSION: No acute cardiopulmonary process. Electronically Signed   By: Suzy Bouchard M.D.   On: 08/31/2020 10:10   ECHOCARDIOGRAM COMPLETE  Result Date: 09/01/2020    ECHOCARDIOGRAM REPORT   Patient Name:   [name] Long Date of Exam: 09/01/2020 Medical Rec #:  [id number]    Height:       64.5 in Accession #:    [id number]   Weight:       176.9 lb Date of Birth:  1940/01/17    BSA:          1.867 m Patient Age:    80 years     BP:           141/81 mmHg Patient Gender: F            HR:           78 bpm. Exam Location:  Inpatient Procedure: 2D Echo, Cardiac Doppler, Color Doppler and Intracardiac            Opacification Agent Indications:    R07.9* Chest pain, unspecified  History:        Patient has prior history of Echocardiogram examinations, most                 recent 11/20/2017. CAD, Previous Myocardial Infarction and Acute                 MI, Prior CABG, Signs/Symptoms:Chest Pain; Risk                 Factors:Dyslipidemia and Hypertension.  Sonographer:    Roseanna Rainbow RDCS Referring Phys: [place] Comments: Technically difficult study due to poor echo windows. IMPRESSIONS  1. Left ventricular ejection fraction, by estimation, is 55 to 60%. The left ventricle has normal function. The left ventricle has no regional wall motion abnormalities. There is mild concentric left ventricular hypertrophy. Left ventricular diastolic parameters are indeterminate.  2. Right ventricular systolic function is low normal. The right ventricular size  is normal.  3. The mitral valve is grossly normal. Trivial mitral valve regurgitation. No evidence of mitral stenosis.  4. The aortic valve has an indeterminant number of cusps. Aortic valve regurgitation is not visualized. No aortic  stenosis is present.  5. The inferior vena cava is normal in size with greater than 50% respiratory variability, suggesting right atrial pressure of 3 mmHg. Comparison(s): No significant change from prior study. Conclusion(s)/Recommendation(s): Normal biventricular function without evidence of hemodynamically significant valvular heart disease. FINDINGS  Left Ventricle: Left ventricular ejection fraction, by estimation, is 55 to 60%. The left ventricle has normal function. The left ventricle has no regional wall motion abnormalities. Definity contrast agent was given IV to delineate the left ventricular  endocardial borders. The left ventricular internal cavity size was normal in size. There is mild concentric left ventricular hypertrophy. Left ventricular diastolic parameters are indeterminate. Right Ventricle: The right ventricular size is normal. Right vetricular wall thickness was not well visualized. Right ventricular systolic function is low normal. Left Atrium: Left atrial size was normal in size. Right Atrium: Right atrial size was normal in size. Pericardium: There is no evidence of pericardial effusion. Mitral Valve: The mitral valve is grossly normal. There is mild thickening of the mitral valve leaflet(s). Trivial mitral valve regurgitation. No evidence of mitral valve stenosis. Tricuspid Valve: The tricuspid valve is normal in structure. Tricuspid valve regurgitation is trivial. No evidence of tricuspid stenosis. Aortic Valve: The aortic valve has an indeterminant number of cusps. Aortic valve regurgitation is not visualized. No aortic stenosis is present. Pulmonic Valve: The pulmonic valve was not well visualized. Pulmonic valve regurgitation is not visualized. Aorta: The aortic root, ascending aorta, aortic arch and descending aorta are all structurally normal, with no evidence of dilitation or obstruction. Venous: The inferior vena cava is normal in size with greater than 50% respiratory variability,  suggesting right atrial pressure of 3 mmHg. IAS/Shunts: The atrial septum is grossly normal.  LEFT VENTRICLE PLAX 2D LVIDd:         3.80 cm     Diastology LVIDs:         2.80 cm     LV e' medial:    4.79 cm/s LV PW:         1.60 cm     LV E/e' medial:  13.8 LV IVS:        1.30 cm     LV e' lateral:   5.66 cm/s LVOT diam:     1.90 cm     LV E/e' lateral: 11.7 LV SV:         39 LV SV Index:   21 LVOT Area:     2.84 cm  LV Volumes (MOD) LV vol d, MOD A2C: 55.1 ml LV vol d, MOD A4C: 75.3 ml LV vol s, MOD A2C: 17.9 ml LV vol s, MOD A4C: 38.6 ml LV SV MOD A2C:     37.2 ml LV SV MOD A4C:     75.3 ml LV SV MOD BP:      36.3 ml RIGHT VENTRICLE            IVC RV S prime:     5.98 cm/s  IVC diam: 1.20 cm TAPSE (M-mode): 1.1 cm LEFT ATRIUM           Index       RIGHT ATRIUM          Index LA diam:      2.90 cm 1.55 cm/m  RA Area:     8.28 cm LA Vol (A2C): 16.9 ml 9.05 ml/m  RA Volume:   13.60 ml 7.28 ml/m LA Vol (A4C): 20.6 ml 11.03 ml/m  AORTIC VALVE LVOT Vmax:   87.70 cm/s LVOT Vmean:  60.400 cm/s LVOT VTI:    0.139 m  AORTA Ao Root diam: 3.30 cm Ao Asc diam:  3.80 cm MITRAL VALVE MV Area (PHT): 2.48 cm    SHUNTS MV Decel Time: 306 msec    Systemic VTI:  0.14 m MV E velocity: 66.00 cm/s  Systemic Diam: 1.90 cm MV A velocity: 82.90 cm/s MV E/A ratio:  0.80 Buford Dresser MD Electronically signed by Buford Dresser MD Signature Date/Time: 09/01/2020/1:10:00 PM    Final     Cardiac Studies    Cath: 08/31/20   Total occlusion of SVG to RCA-PL.   Patent LIMA to LAD.   Patent SVG to OM.   85% heavily calcified distal left main.   85 to 90% proximal LAD before the first diagonal, followed by 100% occlusion in the mid vessel.   60 to 70% ostial to proximal circumflex.  Competitive flow was noted in the circumflex due to the widely patent saphenous vein graft.   Right coronary is dominant, heavily calcified, and tortuous.  In the proximal vessel within a right angle bend there is 50 to 70% obstruction  with eccentric calcification.  The mid RCA contains focal eccentric 60 to 70% stenosis.   LV function is normal.  EF is 60%.  LVEDP is 12 mmHg.   Plan: Medical therapy If recurring symptoms compatible with angina, consider therapy of the right coronary proximal and mid segment.  This would require plaque modification to get full stent expansion.  Would likely be difficult to perform due to tortuosity.  May be more appropriately managed from a femoral approach.  Discussed with Dr. Ellyn Hack.   Diagnostic Dominance: Right Echo: 09/01/20   IMPRESSIONS     1. Left ventricular ejection fraction, by estimation, is 55 to 60%. The  left ventricle has normal function. The left ventricle has no regional  wall motion abnormalities. There is mild concentric left ventricular  hypertrophy. Left ventricular diastolic  parameters are indeterminate.   2. Right ventricular systolic function is low normal. The right  ventricular size is normal.   3. The mitral valve is grossly normal. Trivial mitral valve  regurgitation. No evidence of mitral stenosis.   4. The aortic valve has an indeterminant number of cusps. Aortic valve  regurgitation is not visualized. No aortic stenosis is present.   5. The inferior vena cava is normal in size with greater than 50%  respiratory variability, suggesting right atrial pressure of 3 mmHg.   Comparison(s): No significant change from prior study.   Conclusion(s)/Recommendation(s): Normal biventricular function without  evidence of hemodynamically significant valvular heart disease.   FINDINGS   Left Ventricle: Left ventricular ejection fraction, by estimation, is 55  to 60%. The left ventricle has normal function. The left ventricle has no  regional wall motion abnormalities. Definity contrast agent was given IV  to delineate the left ventricular   endocardial borders. The left ventricular internal cavity size was normal  in size. There is mild concentric left  ventricular hypertrophy. Left  ventricular diastolic parameters are indeterminate.   Right Ventricle: The right ventricular size is normal. Right vetricular  wall thickness was not well visualized. Right ventricular systolic  function is low normal.   Left Atrium: Left atrial size was normal  in size.   Right Atrium: Right atrial size was normal in size.   Pericardium: There is no evidence of pericardial effusion.   Mitral Valve: The mitral valve is grossly normal. There is mild thickening  of the mitral valve leaflet(s). Trivial mitral valve regurgitation. No  evidence of mitral valve stenosis.   Tricuspid Valve: The tricuspid valve is normal in structure. Tricuspid  valve regurgitation is trivial. No evidence of tricuspid stenosis.   Aortic Valve: The aortic valve has an indeterminant number of cusps.  Aortic valve regurgitation is not visualized. No aortic stenosis is  present.   Pulmonic Valve: The pulmonic valve was not well visualized. Pulmonic valve  regurgitation is not visualized.   Aorta: The aortic root, ascending aorta, aortic arch and descending aorta  are all structurally normal, with no evidence of dilitation or  obstruction.   Venous: The inferior vena cava is normal in size with greater than 50%  respiratory variability, suggesting right atrial pressure of 3 mmHg.   IAS/Shunts: The atrial septum is grossly normal.  Patient Profile     80 y.o. female with a hx of CAD s/p CABG with LIMA to LAD, SVG to OM and RCA, post operative sternal wound infection, renal cell carcinoma s/p partial nephrectomy (follows with Dr. Gilford Rile), HTN, and HLD who was seen for chest pain.   Assessment & Plan    Unstable angina: Pt underwent CABG with LIMA to LAD, SVG to OM and RCA. Last ischemic evaluation with stress testing 2017 with normal stress test and echocardiogram. She [facility] to [facility]20 with acute onset of anterior chest pain with radiation to left jaw and shoulder.  hsTn were essentially negative. Underwent cardiac cath noted above with patent LIMA to LAD, SVG to OM. Occluded SVG tp RCA-PL along with native 70% pRCA (heavily calcified) and 70% mRCA disease. Recommendations for medical therapy and if develops refractory angina then consider PCI of the RCA.  -- Attempted to add Imdur yesterday but did not tolerate 2/2 headache. Also has felt dizzy. Still has a heavy sensation in the chest and was short of breath with walking yesterday. Discuss with MD regarding PCI to RCA today vs tomorrow given recurrent symptoms.  We will start plavix load. Will make NPO for now.  With ongoing symptoms despite medical management, pick up this point we need to just go ahead and proceed with PCI of the RCA.  I discussed with Dr. [place] who plans for PCI today.  We will likely consider intravascular imaging to determine appropriate debulking strategy.  -- continue ASA, statin, BB   HTN: stable but on the soft side, will stop Imdur  -- Continue home metoprolol   HLD: Last LDL, 52 from 05/17/2017 -- Continue statin    Hx of renal cell carcinoma s/p partial nephrectomy: Follows with Dr. Gilford Rile -- Creatinine stable at 0.96>>0.97  Headache: has hx of the same. Reports lingering headache which started after receiving nitro. Will stop Imdur  For questions or updates, please contact [place] Please consult [email or web address] for contact info under   Addendum: reviewed with MD and will be able to proceed with cath/PCI to the RCA today. Will load with plavix '600mg'$  x1. Cath orders placed.      Signed, Reino Bellis, NP  09/02/2020, 8:54 AM     ATTENDING ATTESTATION  I have seen, examined and evaluated the patient this afternoon along with Reino Bellis, NP after reviewing all the available data and chart, we discussed the patients laboratory, study &  physical findings as well as symptoms in detail. I agree with her findings, examination as well as impression recommendations  as per our discussion.    Attending adjustments noted in italics.   She continues to have chest pressure and heaviness as well as exertional dyspnea despite trying medical management.  Has not really tolerated Imdur.  I am leery of being more aggressive with medical management and I think probably the best course of action is to proceed with RCA PCI to remove that potential feature.  We will continue to monitor pressures, but stop Imdur.  If necessary we may need to choose a different antianginal medication or search for nonanginal causes of her symptoms if she does not feel any better after PCI.  I did discuss the risks and benefits alternatives and indications of PCI with her today.    Glenetta Hew, M.D., M.S. Interventional Cardiologist   Pager # [phone] Phone # [phone] [address] [place], [place] [address]

## 2020-09-02 NOTE — Interval H&P Note (Signed)
History and Physical Interval Note:  09/02/2020 1:21 PM  [name] Long  has presented today for surgery, with the diagnosis of chest pain.  The various methods of treatment have been discussed with the patient and family. After consideration of risks, benefits and other options for treatment, the patient has consented to  Procedure(s): CORONARY STENT INTERVENTION (N/A) as a surgical intervention.  The patient's history has been reviewed, patient examined, no change in status, stable for surgery.  I have reviewed the patient's chart and labs.  Questions were answered to the patient's satisfaction.   Cath Lab Visit (complete for each Cath Lab visit)  Clinical Evaluation Leading to the Procedure:   ACS: Yes.    Non-ACS:    Anginal Classification: CCS IV  Anti-ischemic medical therapy: Maximal Therapy (2 or more classes of medications)  Non-Invasive Test Results: No non-invasive testing performed  Prior CABG: Previous CABG        [name] Long [facility] 09/02/2020 1:22 PM

## 2020-09-03 ENCOUNTER — Encounter (HOSPITAL_COMMUNITY): Payer: Self-pay

## 2020-09-03 ENCOUNTER — Encounter (HOSPITAL_COMMUNITY): Payer: Self-pay | Admitting: Cardiology

## 2020-09-03 ENCOUNTER — Ambulatory Visit (HOSPITAL_COMMUNITY): Payer: PPO

## 2020-09-03 ENCOUNTER — Other Ambulatory Visit (HOSPITAL_COMMUNITY): Payer: Self-pay

## 2020-09-03 DIAGNOSIS — I2510 Atherosclerotic heart disease of native coronary artery without angina pectoris: Secondary | ICD-10-CM

## 2020-09-03 DIAGNOSIS — Z955 Presence of coronary angioplasty implant and graft: Secondary | ICD-10-CM

## 2020-09-03 DIAGNOSIS — I25709 Atherosclerosis of coronary artery bypass graft(s), unspecified, with unspecified angina pectoris: Secondary | ICD-10-CM | POA: Clinically undetermined

## 2020-09-03 LAB — CBC
HCT: 35.7 % — ABNORMAL LOW (ref 36.0–46.0)
Hemoglobin: 11.3 g/dL — ABNORMAL LOW (ref 12.0–15.0)
MCH: 29.2 pg (ref 26.0–34.0)
MCHC: 31.7 g/dL (ref 30.0–36.0)
MCV: 92.2 fL (ref 80.0–100.0)
Platelets: 156 10*3/uL (ref 150–400)
RBC: 3.87 MIL/uL (ref 3.87–5.11)
RDW: 13.8 % (ref 11.5–15.5)
WBC: 7.6 10*3/uL (ref 4.0–10.5)
nRBC: 0 % (ref 0.0–0.2)

## 2020-09-03 LAB — BASIC METABOLIC PANEL
Anion gap: 8 (ref 5–15)
BUN: 20 mg/dL (ref 8–23)
CO2: 23 mmol/L (ref 22–32)
Calcium: 8.1 mg/dL — ABNORMAL LOW (ref 8.9–10.3)
Chloride: 107 mmol/L (ref 98–111)
Creatinine, Ser: 1 mg/dL (ref 0.44–1.00)
GFR, Estimated: 58 mL/min — ABNORMAL LOW (ref >60)
Glucose, Bld: 98 mg/dL (ref 70–99)
Potassium: 4.3 mmol/L (ref 3.5–5.1)
Sodium: 138 mmol/L (ref 135–145)

## 2020-09-03 MED ORDER — CLOPIDOGREL BISULFATE 75 MG PO TABS
75.0000 mg | ORAL_TABLET | Freq: Every day | ORAL | 3 refills | Status: DC
  Filled 2020-09-03: qty 90, 90d supply, fill #0

## 2020-09-03 NOTE — Progress Notes (Signed)
Sometime overnight multiple entries for pulse, RR, & O2 were documented under this patient going back to 9/1 afternoon stating they were "taken by [employer] vs [employer]". These values are incorrect and pt's vitals have been stable all night.

## 2020-09-03 NOTE — Discharge Summary (Addendum)
Discharge Summary    Patient ID: [name] MRN: [id number]; DOB: 1940-06-17  Admit date: 08/31/2020 Discharge date: 09/03/2020  PCP:  Denita Lung, MD   [facility] Providers Cardiologist:  Quay Burow, MD   Discharge Diagnoses    Principal Problem:   Unstable angina [facility]) Active Problems:   CAD, multiple vessel   Coronary artery disease involving coronary bypass graft of native heart with angina pectoris ([place])   Presence of drug coated stent in right coronary artery   Hyperlipidemia with target LDL less than 70   S/P CABG x 3: (lima-lad,svg-om,svg-rca)   Essential hypertension   Diagnostic Studies/Procedures    Stent and intravascular lithotripsy 09/02/20:   Prox RCA lesion is 80% stenosed.   A drug-eluting stent was successfully placed using a SYNERGY XD 3.0X24.   Post intervention, there is a 0% residual stenosis.   Successful PCI of the proximal RCA with IVUS guidance, orbital atherectomy, Shockwave lithotripsy and DES x 1   Plan: DAPT for one year. If stable anticipate DC in am. _____________  Left heart cath 08/31/20:   Total occlusion of SVG to RCA-PL.   Patent LIMA to LAD.   Patent SVG to OM.   85% heavily calcified distal left main.   85 to 90% proximal LAD before the first diagonal, followed by 100% occlusion in the mid vessel.   60 to 70% ostial to proximal circumflex.  Competitive flow was noted in the circumflex due to the widely patent saphenous vein graft.   Right coronary is dominant, heavily calcified, and tortuous.  In the proximal vessel within a right angle bend there is 50 to 70% obstruction with eccentric calcification.  The mid RCA contains focal eccentric 60 to 70% stenosis.   LV function is normal.  EF is 60%.  LVEDP is 12 mmHg.   Plan: Medical therapy If recurring symptoms compatible with angina, consider therapy of the right coronary proximal and mid segment.  This would require plaque modification to get full stent expansion.  Would  likely be difficult to perform due to tortuosity.  May be more appropriately managed from a femoral approach.  Discussed with Dr. Ellyn Hack.  _____________  Echo 8/31/122: 1. Left ventricular ejection fraction, by estimation, is 55 to 60%. The  left ventricle has normal function. The left ventricle has no regional  wall motion abnormalities. There is mild concentric left ventricular  hypertrophy. Left ventricular diastolic  parameters are indeterminate.   2. Right ventricular systolic function is low normal. The right  ventricular size is normal.   3. The mitral valve is grossly normal. Trivial mitral valve  regurgitation. No evidence of mitral stenosis.   4. The aortic valve has an indeterminant number of cusps. Aortic valve  regurgitation is not visualized. No aortic stenosis is present.   5. The inferior vena cava is normal in size with greater than 50%  respiratory variability, suggesting right atrial pressure of 3 mmHg.      History of Present Illness     [name] Long is a 80 y.o. female with a hx of CAD s/p CABG with LIMA to LAD, SVG to OM and RCA, post operative sternal wound infection, renal cell carcinoma s/p partial nephrectomy (follows with Dr. Gilford Rile), HTN, and HLD who presented with chest pain.   [name] Long is a 80yo with a hx as stated above who presented to [facility] 08/31/20 with acute onset of anterior chest pain with radiation to left jaw and shoulder. Pt states that  she was not feeling well yesterday. In the later afternoon, she began having left sided jaw pain which radiated to her left shoulder. She attempted relief with Tylenol and an ice pack thinking that it was musculoskeletal however her symptoms persisted. She states that her husband had a cardiology appointment today. On their way to the office, she started having very intense chest pain with radiation to her left jaw. On their arrival to the office, EMS was called and she received 2 SL NTG tablets with relief. She  continues to have mild chest pressure since her arrival. Given her prior hx of CABG cardiology has been asked to evaluate.    In the ED, EKG is abnormal with diffuse TWI however present on prior tracings. HsT found to be 9 with pending repeat. Creatinine stable at 0.96. No electrolyte abnormalities. CXR with no acute cardiopulmonary process. She reports that she walks daily and recalls feeling more SOB over the last several months to the point she has to stop during her walk however she has contributed this to her knee replacement and brief pause in her routine exercise while healing. Plan at last follow up was for repeat echocardiogram which has been scheduled.    She follows with Dr. Gwenlyn Found for her cardiology care. She was admitted 01/2011 with chest pain and underwent LHC which showed 3VD and she was referred to [facility] for surgical intervention. She ultimately underwent CABG with LIMA to LAD, SVG to OM and RCA. Her last ischemic evaluation was in 2017 with a Myoview stress test which was normal.    Hospital Course     Consultants:   CAD s/p CABG HS troponins remained negative. Left heart cath on 08/31/20 revealed total occlusion of SVG-RCA, patent LIMA-LAD, and patent SVG-OM. She has 85% left main stenosis with 85-90% proximal LAD followed by 100% occlusion after the D1. RCA with 50-70% eccentric calcification. Medical therapy was pursued. Unfortunately she did not tolerate imdur and had refractory CP. She was taken back to the cath lab for IVUS-guided orbital atherectomy and shock wave lithotripsy followed by DES to the proximal RCA. Lesion distal to treated area of RCA felt to be wire bias, no additional PCI. She was started on DAPT with ASA and plavix. Continue BB, satin increased as below. If additional anti-angina is needed in the future, would avoid imdur. She may be more tolerant of amlodipine. She feels much better today following RCA intervention. No further chest pain. She has ambulated in the  halls well.    Hyperlipidemia with LDL goal less than 70 09/01/2020: Cholesterol 143; HDL 70; LDL Cholesterol 62; Triglycerides 57; VLDL 11 Lipitor was increased from 20 mg to 80 mg. She will increase slowly, first with 40 mg three times per week.    Hypertension Continue BB. Pressure well-controlled.   Upcoming procedures She has a concerning lesion on her kidney. Unclear if she will need to have IR biopsy in the future. She will also need knee manipulation, exact procedure unclear. We discussed that she would not be able to hold plavix for at least 6 months after PCI, although will defer to primary cardiologist. May be able to have kidney lesion biopsied without holding plavix.    Did the patient have an acute coronary syndrome (MI, NSTEMI, STEMI, etc) this admission?:  No                               Did the patient  have a percutaneous coronary intervention (stent / angioplasty)?:  Yes.     Cath/PCI Registry Performance & Quality Measures: Aspirin prescribed? - Yes ADP Receptor Inhibitor (Plavix/Clopidogrel, Brilinta/Ticagrelor or Effient/Prasugrel) prescribed (includes medically managed patients)? - Yes High Intensity Statin (Lipitor 40-'80mg'$  or Crestor 20-'40mg'$ ) prescribed? - Yes For EF <40%, was ACEI/ARB prescribed? - Not Applicable (EF >/= [id number]) For EF <40%, Aldosterone Antagonist (Spironolactone or Eplerenone) prescribed? - Not Applicable (EF >/= [id number]) Cardiac Rehab Phase II ordered? - Yes      _____________  Discharge Vitals Blood pressure 123/76, pulse 83, temperature 99.3 F (37.4 C), temperature source Oral, resp. rate 20, height 5' 4.5" (1.638 m), weight 81 kg, SpO2 98 %.  Filed Weights   09/01/20 0427 09/02/20 0500 09/03/20 0643  Weight: 80.2 kg 80.5 kg 81 kg   Physical Exam Constitutional:      Appearance: Normal appearance.  Eyes:     Extraocular Movements: Extraocular movements intact.  Neck:     Vascular: No carotid bruit.     Comments: No  JVD Cardiovascular:     Rate and Rhythm: Normal rate and regular rhythm.     Heart sounds: Normal heart sounds. No murmur heard. Pulmonary:     Effort: Pulmonary effort is normal.     Breath sounds: Normal breath sounds.  Abdominal:     General: Bowel sounds are normal.     Palpations: Abdomen is soft.  Musculoskeletal:     Right lower leg: No edema.     Left lower leg: No edema.  Skin:    General: Skin is warm and dry.  Neurological:     Mental Status: She is alert and oriented to person, place, and time.  Psychiatric:        Behavior: Behavior normal.   Bilateral radial artery access sites C/D/I   Labs & Radiologic Studies    CBC Recent Labs    09/01/20 0241 09/03/20 0225  WBC 8.8 7.6  HGB 12.6 11.3*  HCT 39.3 35.7*  MCV 90.8 92.2  PLT 185 [id number]   Basic Metabolic Panel Recent Labs    09/01/20 0241 09/03/20 0225  NA 140 138  K 4.5 4.3  CL 105 107  CO2 27 23  GLUCOSE 97 98  BUN 24* 20  CREATININE 0.97 1.00  CALCIUM 8.8* 8.1*   Liver Function Tests No results for input(s): AST, ALT, ALKPHOS, BILITOT, PROT, ALBUMIN in the last 72 hours. No results for input(s): LIPASE, AMYLASE in the last 72 hours. High Sensitivity Troponin:   Recent Labs  Lab 08/31/20 0926 08/31/20 1126  TROPONINIHS 9 8    BNP Invalid input(s): POCBNP D-Dimer No results for input(s): DDIMER in the last 72 hours. Hemoglobin A1C No results for input(s): HGBA1C in the last 72 hours. Fasting Lipid Panel Recent Labs    09/01/20 0241  CHOL 143  HDL 70  LDLCALC 62  TRIG 57  CHOLHDL 2.0   Thyroid Function Tests No results for input(s): TSH, T4TOTAL, T3FREE, THYROIDAB in the last 72 hours.  Invalid input(s): FREET3 _____________  CARDIAC CATHETERIZATION  Result Date: 09/02/2020   Prox RCA lesion is 80% stenosed.   A drug-eluting stent was successfully placed using a SYNERGY XD 3.0X24.   Post intervention, there is a 0% residual stenosis. Successful PCI of the proximal RCA with  IVUS guidance, orbital atherectomy, Shockwave lithotripsy and DES x 1 Plan: DAPT for one year. If stable anticipate DC in am.   CARDIAC CATHETERIZATION  Result Date: 08/31/2020  Total occlusion of SVG to RCA-PL.   Patent LIMA to LAD.   Patent SVG to OM.   85% heavily calcified distal left main.   85 to 90% proximal LAD before the first diagonal, followed by 100% occlusion in the mid vessel.   60 to 70% ostial to proximal circumflex.  Competitive flow was noted in the circumflex due to the widely patent saphenous vein graft.   Right coronary is dominant, heavily calcified, and tortuous.  In the proximal vessel within a right angle bend there is 50 to 70% obstruction with eccentric calcification.  The mid RCA contains focal eccentric 60 to 70% stenosis.   LV function is normal.  EF is 60%.  LVEDP is 12 mmHg. Plan: Medical therapy If recurring symptoms compatible with angina, consider therapy of the right coronary proximal and mid segment.  This would require plaque modification to get full stent expansion.  Would likely be difficult to perform due to tortuosity.  May be more appropriately managed from a femoral approach.  Discussed with Dr. Ellyn Hack.   PERIPHERAL VASCULAR CATHETERIZATION  Result Date: 09/02/2020   Prox RCA lesion is 80% stenosed.   A drug-eluting stent was successfully placed using a SYNERGY XD 3.0X24.   Post intervention, there is a 0% residual stenosis. Successful PCI of the proximal RCA with IVUS guidance, orbital atherectomy, Shockwave lithotripsy and DES x 1 Plan: DAPT for one year. If stable anticipate DC in am.   DG Chest Portable 1 View  Result Date: 08/31/2020 CLINICAL DATA:  Chest pain EXAM: PORTABLE CHEST 1 VIEW COMPARISON:  None. FINDINGS: Sternotomy wires overlie normal cardiac silhouette. Normal pulmonary vasculature. No effusion, infiltrate, or pneumothorax. No acute osseous abnormality. IMPRESSION: No acute cardiopulmonary process. Electronically Signed   By: Suzy Bouchard M.D.   On: 08/31/2020 10:10   ECHOCARDIOGRAM COMPLETE  Result Date: 09/01/2020    ECHOCARDIOGRAM REPORT   Patient Name:   MINDE GASH Date of Exam: 09/01/2020 Medical Rec #:  [id number]    Height:       64.5 in Accession #:    [id number]   Weight:       176.9 lb Date of Birth:  02-Aug-1940    BSA:          1.867 m Patient Age:    73 years     BP:           141/81 mmHg Patient Gender: F            HR:           78 bpm. Exam Location:  Inpatient Procedure: 2D Echo, Cardiac Doppler, Color Doppler and Intracardiac            Opacification Agent Indications:    R07.9* Chest pain, unspecified  History:        Patient has prior history of Echocardiogram examinations, most                 recent 11/20/2017. CAD, Previous Myocardial Infarction and Acute                 MI, Prior CABG, Signs/Symptoms:Chest Pain; Risk                 Factors:Dyslipidemia and Hypertension.  Sonographer:    Roseanna Rainbow RDCS Referring Phys: [place] Comments: Technically difficult study due to poor echo windows. IMPRESSIONS  1. Left ventricular ejection fraction, by estimation, is 55 to 60%. The left ventricle has  normal function. The left ventricle has no regional wall motion abnormalities. There is mild concentric left ventricular hypertrophy. Left ventricular diastolic parameters are indeterminate.  2. Right ventricular systolic function is low normal. The right ventricular size is normal.  3. The mitral valve is grossly normal. Trivial mitral valve regurgitation. No evidence of mitral stenosis.  4. The aortic valve has an indeterminant number of cusps. Aortic valve regurgitation is not visualized. No aortic stenosis is present.  5. The inferior vena cava is normal in size with greater than 50% respiratory variability, suggesting right atrial pressure of 3 mmHg. Comparison(s): No significant change from prior study. Conclusion(s)/Recommendation(s): Normal biventricular function without evidence of hemodynamically  significant valvular heart disease. FINDINGS  Left Ventricle: Left ventricular ejection fraction, by estimation, is 55 to 60%. The left ventricle has normal function. The left ventricle has no regional wall motion abnormalities. Definity contrast agent was given IV to delineate the left ventricular  endocardial borders. The left ventricular internal cavity size was normal in size. There is mild concentric left ventricular hypertrophy. Left ventricular diastolic parameters are indeterminate. Right Ventricle: The right ventricular size is normal. Right vetricular wall thickness was not well visualized. Right ventricular systolic function is low normal. Left Atrium: Left atrial size was normal in size. Right Atrium: Right atrial size was normal in size. Pericardium: There is no evidence of pericardial effusion. Mitral Valve: The mitral valve is grossly normal. There is mild thickening of the mitral valve leaflet(s). Trivial mitral valve regurgitation. No evidence of mitral valve stenosis. Tricuspid Valve: The tricuspid valve is normal in structure. Tricuspid valve regurgitation is trivial. No evidence of tricuspid stenosis. Aortic Valve: The aortic valve has an indeterminant number of cusps. Aortic valve regurgitation is not visualized. No aortic stenosis is present. Pulmonic Valve: The pulmonic valve was not well visualized. Pulmonic valve regurgitation is not visualized. Aorta: The aortic root, ascending aorta, aortic arch and descending aorta are all structurally normal, with no evidence of dilitation or obstruction. Venous: The inferior vena cava is normal in size with greater than 50% respiratory variability, suggesting right atrial pressure of 3 mmHg. IAS/Shunts: The atrial septum is grossly normal.  LEFT VENTRICLE PLAX 2D LVIDd:         3.80 cm     Diastology LVIDs:         2.80 cm     LV e' medial:    4.79 cm/s LV PW:         1.60 cm     LV E/e' medial:  13.8 LV IVS:        1.30 cm     LV e' lateral:   5.66  cm/s LVOT diam:     1.90 cm     LV E/e' lateral: 11.7 LV SV:         39 LV SV Index:   21 LVOT Area:     2.84 cm  LV Volumes (MOD) LV vol d, MOD A2C: 55.1 ml LV vol d, MOD A4C: 75.3 ml LV vol s, MOD A2C: 17.9 ml LV vol s, MOD A4C: 38.6 ml LV SV MOD A2C:     37.2 ml LV SV MOD A4C:     75.3 ml LV SV MOD BP:      36.3 ml RIGHT VENTRICLE            IVC RV S prime:     5.98 cm/s  IVC diam: 1.20 cm TAPSE (M-mode): 1.1 cm LEFT ATRIUM  Index       RIGHT ATRIUM          Index LA diam:      2.90 cm 1.55 cm/m  RA Area:     8.28 cm LA Vol (A2C): 16.9 ml 9.05 ml/m  RA Volume:   13.60 ml 7.28 ml/m LA Vol (A4C): 20.6 ml 11.03 ml/m  AORTIC VALVE LVOT Vmax:   87.70 cm/s LVOT Vmean:  60.400 cm/s LVOT VTI:    0.139 m  AORTA Ao Root diam: 3.30 cm Ao Asc diam:  3.80 cm MITRAL VALVE MV Area (PHT): 2.48 cm    SHUNTS MV Decel Time: 306 msec    Systemic VTI:  0.14 m MV E velocity: 66.00 cm/s  Systemic Diam: 1.90 cm MV A velocity: 82.90 cm/s MV E/A ratio:  0.80 Buford Dresser MD Electronically signed by Buford Dresser MD Signature Date/Time: 09/01/2020/1:10:00 PM    Final    Disposition   Pt is being discharged home today in good condition.  Follow-up Plans & Appointments     Follow-up Information     Ledora Bottcher, [place] Follow up on 09/20/2020.   Specialties: Physician Assistant, Cardiology, Radiology Why: 3:15 pm Contact information: [address] [place] [address] [phone]                Discharge Instructions     Amb Referral to Cardiac Rehabilitation   Complete by: As directed    Diagnosis:  Coronary Stents PTCA     After initial evaluation and assessments completed: Virtual Based Care may be provided alone or in conjunction with Phase 2 Cardiac Rehab based on patient barriers.: Yes   Diet - low sodium heart healthy   Complete by: As directed    Discharge instructions   Complete by: As directed    No driving for 1 week. No lifting over 5 lbs for 1  week. No sexual activity for 1 week. Keep procedure site clean & dry. If you notice increased pain, swelling, bleeding or pus, call/return!  You may shower, but no soaking baths/hot tubs/pools for 1 week.   Increase activity slowly   Complete by: As directed        Discharge Medications   Allergies as of 09/03/2020       Reactions   Dimenhydrinate Other (See Comments)   "locks up kidneys"   Morphine And Related Nausea And Vomiting   Percocet [oxycodone-acetaminophen] Other (See Comments)   hallucinations   Zolpidem Other (See Comments)   Unusual behavior        Medication List     STOP taking these medications    Bac 50-325-40 MG tablet Generic drug: butalbital-acetaminophen-caffeine       TAKE these medications    albuterol 108 (90 Base) MCG/ACT inhaler Commonly known as: Proventil HFA INHALE 2 PUFF AS DIRECTED FOUR TIMES A DAY AS NEEDED What changed:  how much to take how to take this when to take this reasons to take this additional instructions   aspirin EC 81 MG tablet Take 81 mg by mouth daily. Swallow whole.   atorvastatin 80 MG tablet Commonly known as: LIPITOR Take 1 tablet (80 mg total) by mouth daily. What changed:  medication strength See the new instructions.   CALTRATE 600+D3 PO Take 1 tablet by mouth daily.   carisoprodol 350 MG tablet Commonly known as: SOMA Take 350 mg by mouth every 8 (eight) hours as needed (for arthritis pain).   clopidogrel 75 MG tablet  Commonly known as: PLAVIX Take 1 tablet (75 mg total) by mouth daily with breakfast. Start taking on: September 04, 2020   Fluticasone-Salmeterol 500-50 MCG/DOSE Aepb Commonly known as: Advair Diskus Inhale 1 puff into the lungs 2 (two) times daily.   gabapentin 600 MG tablet Commonly known as: NEURONTIN Take 1,200-1,800 mg by mouth See admin instructions. Take 2 tablets (1200 mg) by mouth in the morning & take 3 tablets (1800 mg) by mouth at night   HYDROmorphone 4 MG  tablet Commonly known as: DILAUDID Take 1 tablet (4 mg total) by mouth every 4 (four) hours as needed for severe pain.   magnesium oxide 400 MG tablet Commonly known as: MAG-OX Take 400 mg by mouth at bedtime.   metoprolol tartrate 25 MG tablet Commonly known as: LOPRESSOR Take 1 tablet (25 mg total) by mouth 2 (two) times daily.   montelukast 10 MG tablet Commonly known as: SINGULAIR TAKE ONE TABLET BY MOUTH DAILY   nitroGLYCERIN 0.4 MG SL tablet Commonly known as: NITROSTAT Place 1 tablet (0.4 mg total) under the tongue every 5 (five) minutes x 3 doses as needed for chest pain.   PRESERVISION AREDS 2 PO Take 1 tablet by mouth in the morning and at bedtime.   silodosin 8 MG Caps capsule Commonly known as: RAPAFLO Take 8 mg by mouth daily.           Outstanding Labs/Studies   CBC  Duration of Discharge Encounter   Greater than 30 minutes including physician time.  Signed, Tami Lin [place], PA 09/03/2020, 10:42 AM  ATTENDING ATTESTATION  I have seen, examined and evaluated the patient this morning on rounds along with Fabian Sharp, PA.  After reviewing all the available data and chart, we discussed the patients laboratory, study & physical findings as well as symptoms in detail. I agree with her findings, examination as well as impression recommendations and summary as per our discussion.    She presented with symptoms that were concerning for unstable angina.  Did not rule in with troponin levels, but may very well of had a small ACS event secondary to occluded SVG-RCA.  The vein graft was flush occluded and not revascularizable.  The native RCA had calcified proximal stenosis.  The plan was initially to attempt medical management which she has failed, therefore she underwent atherectomy and lithotripsy based DES PCI of the proximal RCA with complete resolution of her symptoms.  This morning she felt  [place].  No further chest tightness/pressure/angina.  Has ambulated  in the hallway without difficulty.  Medications been adjusted according to changes made in hospital. Patient feels great this morning is ready for discharge.  Agree with discharge summary.Glenetta Hew, M.D., M.S. Interventional Cardiologist   Pager # [phone] Phone # [phone] [address] [place], [place] [address]

## 2020-09-03 NOTE — Progress Notes (Signed)
[name] Long to be D/C'd home per MD order. Discussed with the patient and husband and all questions fully answered.  Skin clean, dry and intact without evidence of skin break down, no evidence of skin tears noted.  IV catheter discontinued intact. Site without signs and symptoms of complications. Dressing and pressure applied. Instructed to remove R wrist dressing after 24 hours (this afternoon). An After Visit Summary was printed and given to the patient.  Patient ambulated and escorted off unit, and D/C home via private auto.  Melonie [place]  09/03/2020 12:19 PM

## 2020-09-03 NOTE — Progress Notes (Signed)
CARDIAC REHAB PHASE I   PRE:  Rate/Rhythm: 83 SR    BP: sitting 134/77    SaO2:   MODE:  Ambulation: 230 ft   POST:  Rate/Rhythm: 93 SR    BP: sitting 155/76     SaO2: 94 RA  Pt has been up walking around room this am. Sts she feels better than yesterday before the cath. She did have brief dizzy spells x3 walking, some SOB. Sts it is better than yesterday. VSS.   Discussed stent, Plavix, restrictions, diet, exercise, NTG and CRPII with pt. Very receptive. Will refer to [place] however pt did program in '13 and probably does not want to do again if her exercise at home goes well. [place], ACSM 09/03/2020 9:46 AM

## 2020-09-07 ENCOUNTER — Telehealth (HOSPITAL_COMMUNITY): Payer: Self-pay | Admitting: Cardiovascular Disease

## 2020-09-07 NOTE — Telephone Encounter (Signed)
Patient called this am and wanted [place] to know that she did not NO SHOW for echocardiogram on 09/03/20. Patient was in the hospital for a stent and they performed at [place]. She states that the cardiologist was supposed to contact someone to cancel but appt was NO SHOWED. Please do not charge patient for this.

## 2020-09-15 DIAGNOSIS — M25552 Pain in left hip: Secondary | ICD-10-CM | POA: Diagnosis not present

## 2020-09-15 DIAGNOSIS — Z96651 Presence of right artificial knee joint: Secondary | ICD-10-CM | POA: Diagnosis not present

## 2020-09-19 NOTE — Progress Notes (Signed)
Cardiology Office Note:    Date:  09/20/2020   ID:  [name] Long, DOB 20-Mar-1940, MRN [id number]  PCP:  Denita Lung, MD  Cardiologist:  Quay Burow, MD   Referring MD: Denita Lung, MD   Chief Complaint  Patient presents with   Hospitalization Follow-up    CAD     History of Present Illness:    [name] Long is a 80 y.o. female with a hx of CAD status post CABG, hypertension, aortic atherosclerosis, asthma, and hyperlipidemia.  She underwent CABG x3 with LIMA-LAD, SVG-OM, SVG-RCA in 2013.  Ischemic eval in 2017 with nonischemic Myoview.  She has a history of renal cell carcinoma status post partial nephrectomy followed by Dr. Gilford Rile.  She presented to [facility] on 08/31/2020 with acute onset anterior chest pain with radiation to left jaw and shoulder.  Troponins remained negative.  She underwent left heart catheterization total occlusion of SVG to RCA, patent LIMA-LAD, and patent SVG-OM.  She had 85% left main stenosis with 85 to 90% proximal LAD followed by 100% occlusion after the D1.  RCA with 50 to 70% eccentric calcification.  Medical therapy was pursued.  Unfortunately she did not tolerate Imdur and had refractory chest pain.  She was taken back to the lab for IVUS-guided orbital atherectomy and lesion distal to the treated area of the RCA felt to be wire bias, no additional PCI.  She was started on DAPT with aspirin and Plavix.  She was continued on beta-blocker and statin was increased.  Shockwave lithotripsy followed by DES to the proximal RCA.   Did not tolerate Imdur.  If antianginal is needed would consider amlodipine.   She returns today for post-cath follow-up. She feels terrible. She is having dyspnea with minimal exertion, intermittent chest tightness, diarrhea, and dizziness. She has to stop when walking around the block. She is usually very energetic and has felt extreme fatigue for the last 4-5 days. Medication changes include plavix and increased dose of lipitor.    Past Medical History:  Diagnosis Date   Abnormal EKG 01/12/2011   Allergy    RHINITIS   Arthritis    neck, back knees, hips   Asthma    Asthma 01/12/2011   Back pain, chronic--plans were for cervical disc surgery week of 01/16/11 01/12/2011   Blood clot in vein 2004   L leg   Blood transfusion without reported diagnosis 2004   Chronic low back pain    Complication of anesthesia    post op bladder retention after surgery   Connective tissue disease ([place])    Coronary artery disease    Crescendo angina, with EKG changes, new. 01/12/2011   Dyspnea    Fibromyalgia    Fibromyalgia    GERD (gastroesophageal reflux disease)    Headache    migraine - several in a yr.    History of stress test 04/2011   Normal stress test   Hx of echocardiogram 07/19/2011   EF 50-55%. There is mild annular calcification, there is trace migtral regurgitation, there is trace tricupid regurgitation, pericardium is thick, there is no pericardial effusion.   Hypertension    Myocardial infarction [facility]) 2013   triple heart bypass   Neuromuscular disorder (HCC)    fibromyalgia    Osteoporosis    OSTEOPENIA   Personal history of colonic polyp - adenoma 08/05/2013   08/2013 - 7 mm adenoma - consider repeat colonoscopy 2020 (age 32)   Pneumonia 2018   numerous times, only  admitted one time for pneumonia but she has had pneumonia numerous times;   02-19-2018 END OF NOVEMBER 2019  WAS ADMITTED FOR ABOUT  A WEEK DUE TO "DOUBLE PNEUMONIA", REPORTS TODAY FEELING BACK TO HER NORMAL , DENIES SOB, O2 SAT AT 94 ROOM AIR , AFEBRILE, OTHER VSS    PONV (postoperative nausea and vomiting)    PPD positive    Status post coronary artery bypass grafting    Urinary incontinence    post op, "sleepy bladder", urinar retention     Past Surgical History:  Procedure Laterality Date   [place]   BIOPSY BREAST Left 03/27/2019   Benign fibroaipose tissue no evidence of malignancy   BLADDER  REPAIR  1985   BREAST SURGERY  1992   REDUCTION, x3 surgeries overall on breast for augmentation    CARDIAC CATHETERIZATION  2013   CARPAL TUNNEL RELEASE Bilateral 1988, [place]   CORONARY ARTERY BYPASS GRAFT  01/14/2011   Procedure: CORONARY ARTERY BYPASS GRAFTING (CABG);  Surgeon: Tharon Aquas Adelene Idler, MD;  Location: [place];  Service: Open Heart Surgery;  Laterality: N/A;  CABG x three, using right greater saphenous vein harvested endoscopically   CORONARY ATHERECTOMY N/A 09/02/2020   Procedure: CORONARY ATHERECTOMY;  Surgeon: Martinique, Peter M, MD;  Location: [place] CV LAB;  Service: Cardiovascular;  Laterality: N/A;   CORONARY STENT INTERVENTION N/A 09/02/2020   Procedure: CORONARY STENT INTERVENTION;  Surgeon: Martinique, Peter M, MD;  Location: [place] CV LAB;  Service: Cardiovascular;  Laterality: N/A;   EYE SURGERY Bilateral 1985   radiokerototomy   INTRAVASCULAR LITHOTRIPSY  09/02/2020   Procedure: INTRAVASCULAR LITHOTRIPSY;  Surgeon: Martinique, Peter M, MD;  Location: [place] CV LAB;  Service: Cardiovascular;;   INTRAVASCULAR ULTRASOUND/IVUS N/A 09/02/2020   Procedure: Intravascular Ultrasound/IVUS;  Surgeon: Martinique, Peter M, MD;  Location: [place] [facility] LAB;  Service: Cardiovascular;  Laterality: N/A;   LEFT HEART CATH AND CORS/GRAFTS ANGIOGRAPHY N/A 08/31/2020   Procedure: LEFT HEART CATH AND CORS/GRAFTS ANGIOGRAPHY;  Surgeon: Belva Crome, MD;  Location: [place] CV LAB;  Service: Cardiovascular;  Laterality: N/A;   LEFT HEART CATHETERIZATION WITH CORONARY ANGIOGRAM N/A 01/13/2011   Procedure: LEFT HEART CATHETERIZATION WITH CORONARY ANGIOGRAM;  Surgeon: Troy Sine, MD;  Location: [facility] CATH LAB;  Service: Cardiovascular;  Laterality: N/A;   PUBOVAGINAL SLING  2002   ROBOT ASSISTED LAPAROSCOPIC NEPHRECTOMY Right 02/27/2018   Procedure: XI ROBOTIC ASSISTE PARTIAL NEPHRECTOMY;  Surgeon: Ceasar Mons, MD;  Location: [facility];  Service:  Urology;  Laterality: Right;   STERNAL WOUND DEBRIDEMENT  07/27/2011   Procedure: STERNAL WOUND DEBRIDEMENT;  followed by wound vac Surgeon: Ivin Poot, MD;  Location: [place];  Service: Open Heart Surgery;  Laterality: N/A;   TONSILLECTOMY  1952   TOTAL HIP ARTHROPLASTY Right 08/22/2016   Procedure: TOTAL HIP ARTHROPLASTY ANTERIOR APPROACH;  Surgeon: Melrose Nakayama, MD;  Location: [place];  Service: Orthopedics;  Laterality: Right;   TOTAL KNEE ARTHROPLASTY Right 07/22/2019   Procedure: RIGHT TOTAL KNEE ARTHROPLASTY;  Surgeon: Melrose Nakayama, MD;  Location: [facility];  Service: Orthopedics;  Laterality: Right;   TRIGGER FINGER RELEASE Right 2014   4th finger   TROCHANTERIC BURSA EXCISION Right    bursa removed, R hip   TUBAL LIGATION      Current Medications: Current Meds  Medication Sig   albuterol (PROVENTIL HFA) 108 (90 Base)  MCG/ACT inhaler INHALE 2 PUFF AS DIRECTED FOUR TIMES A DAY AS NEEDED (Patient taking differently: Inhale 1 puff into the lungs every 6 (six) hours as needed for wheezing or shortness of breath.)   aspirin EC 81 MG tablet Take 81 mg by mouth daily. Swallow whole.   atorvastatin (LIPITOR) 20 MG tablet Take 1 tablet (20 mg total) by mouth daily.   Calcium Carb-Cholecalciferol (CALTRATE 600+D3 PO) Take 1 tablet by mouth daily.   carisoprodol (SOMA) 350 MG tablet Take 350 mg by mouth every 8 (eight) hours as needed (for arthritis pain).    clopidogrel (PLAVIX) 75 MG tablet Take 1 tablet (75 mg total) by mouth daily with breakfast.   Fluticasone-Salmeterol (ADVAIR DISKUS) 500-50 MCG/DOSE AEPB Inhale 1 puff into the lungs 2 (two) times daily.   gabapentin (NEURONTIN) 600 MG tablet Take 1,200-1,800 mg by mouth See admin instructions. Take 2 tablets (1200 mg) by mouth in the morning & take 3 tablets (1800 mg) by mouth at night   HYDROmorphone (DILAUDID) 4 MG tablet Take 1 tablet (4 mg total) by mouth every 4 (four) hours as needed for severe pain.   magnesium oxide (MAG-OX)  400 MG tablet Take 400 mg by mouth at bedtime.   metoprolol tartrate (LOPRESSOR) 25 MG tablet Take 1 tablet (25 mg total) by mouth 2 (two) times daily.   montelukast (SINGULAIR) 10 MG tablet TAKE ONE TABLET BY MOUTH DAILY (Patient taking differently: Take 10 mg by mouth daily.)   Multiple Vitamins-Minerals (PRESERVISION AREDS 2 PO) Take 1 tablet by mouth in the morning and at bedtime.   nitroGLYCERIN (NITROSTAT) 0.4 MG SL tablet Place 1 tablet (0.4 mg total) under the tongue every 5 (five) minutes x 3 doses as needed for chest pain.   silodosin (RAPAFLO) 8 MG CAPS capsule Take 8 mg by mouth daily.    [DISCONTINUED] atorvastatin (LIPITOR) 80 MG tablet Take 1 tablet (80 mg total) by mouth daily.     Allergies:   Dimenhydrinate, Morphine and related, Percocet [oxycodone-acetaminophen], and Zolpidem   Social History   Socioeconomic History   Marital status: Married    Spouse name: Not on file   Number of children: Not on file   Years of education: Not on file   Highest education level: Not on file  Occupational History   Not on file  Tobacco Use   Smoking status: Former    Types: Cigarettes    Quit date: 01/12/1980    Years since quitting: 40.7   Smokeless tobacco: Never  Vaping Use   Vaping Use: Never used  Substance and Sexual Activity   Alcohol use: No    Alcohol/week: 0.0 standard drinks   Drug use: No   Sexual activity: Yes  Other Topics Concern   Not on file  Social History Narrative   Not on file   Social Determinants of Health   Financial Resource Strain: Not on file  Food Insecurity: Not on file  Transportation Needs: Not on file  Physical Activity: Not on file  Stress: Not on file  Social Connections: Not on file     Family History: The patient's family history includes Hypertension in her brother. She was adopted.  ROS:   Please see the history of present illness.     All other systems reviewed and are negative.  EKGs/Labs/Other Studies Reviewed:    The  following studies were reviewed today:  Stent and intravascular lithotripsy 09/02/20:   Prox RCA lesion is 80% stenosed.   A  drug-eluting stent was successfully placed using a SYNERGY XD 3.0X24.   Post intervention, there is a 0% residual stenosis.   Successful PCI of the proximal RCA with IVUS guidance, orbital atherectomy, Shockwave lithotripsy and DES x 1   Plan: DAPT for one year. If stable anticipate DC in am. _____________   Left heart cath 08/31/20:   Total occlusion of SVG to RCA-PL.   Patent LIMA to LAD.   Patent SVG to OM.   85% heavily calcified distal left main.   85 to 90% proximal LAD before the first diagonal, followed by 100% occlusion in the mid vessel.   60 to 70% ostial to proximal circumflex.  Competitive flow was noted in the circumflex due to the widely patent saphenous vein graft.   Right coronary is dominant, heavily calcified, and tortuous.  In the proximal vessel within a right angle bend there is 50 to 70% obstruction with eccentric calcification.  The mid RCA contains focal eccentric 60 to 70% stenosis.   LV function is normal.  EF is 60%.  LVEDP is 12 mmHg.   Plan: Medical therapy If recurring symptoms compatible with angina, consider therapy of the right coronary proximal and mid segment.  This would require plaque modification to get full stent expansion.  Would likely be difficult to perform due to tortuosity.  May be more appropriately managed from a femoral approach.  Discussed with Dr. Ellyn Hack.   _____________   Echo 8/31/122: 1. Left ventricular ejection fraction, by estimation, is 55 to 60%. The  left ventricle has normal function. The left ventricle has no regional  wall motion abnormalities. There is mild concentric left ventricular  hypertrophy. Left ventricular diastolic  parameters are indeterminate.   2. Right ventricular systolic function is low normal. The right  ventricular size is normal.   3. The mitral valve is grossly normal. Trivial  mitral valve  regurgitation. No evidence of mitral stenosis.   4. The aortic valve has an indeterminant number of cusps. Aortic valve  regurgitation is not visualized. No aortic stenosis is present.   5. The inferior vena cava is normal in size with greater than 50%  respiratory variability, suggesting right atrial pressure of 3 mmHg.     EKG:  EKG is  ordered today.  The ekg ordered today demonstrates sinus rhythm HR 78, TWI throughout  Recent Labs: 08/31/2020: ALT 17 09/03/2020: BUN 20; Creatinine, Ser 1.00; Hemoglobin 11.3; Platelets 156; Potassium 4.3; Sodium 138  Recent Lipid Panel    Component Value Date/Time   CHOL 143 09/01/2020 0241   CHOL 170 05/03/2020 0910   TRIG 57 09/01/2020 0241   HDL 70 09/01/2020 0241   HDL 82 05/03/2020 0910   CHOLHDL 2.0 09/01/2020 0241   VLDL 11 09/01/2020 0241   LDLCALC 62 09/01/2020 0241   LDLCALC 75 05/03/2020 0910    Physical Exam:    VS:  BP 140/78 (BP Location: Right Arm)   Pulse 79   Ht 5' 4.5" (1.638 m)   Wt 181 lb 3.2 oz (82.2 kg)   SpO2 96%   BMI 30.62 kg/m     Wt Readings from Last 3 Encounters:  09/20/20 181 lb 3.2 oz (82.2 kg)  09/03/20 178 lb 8 oz (81 kg)  08/11/20 183 lb (83 kg)     GEN:  Well nourished, well developed in no acute distress HEENT: Normal NECK: No JVD; No carotid bruits LYMPHATICS: No lymphadenopathy CARDIAC: RRR, no murmurs, rubs, gallops RESPIRATORY:  Clear to auscultation without  rales, wheezing or rhonchi  ABDOMEN: Soft, non-tender, non-distended MUSCULOSKELETAL:  No edema; No deformity  SKIN: Warm and dry NEUROLOGIC:  Alert and oriented x 3 PSYCHIATRIC:  Normal affect  Cath sites C/D/I  ASSESSMENT:    1. Chest tightness   2. Dyspnea on exertion   3. Fatigue, unspecified type   4. Diarrhea, unspecified type   5. Essential hypertension   6. Mixed hyperlipidemia   7. S/P CABG x 3: (lima-lad,svg-om,svg-rca)   8. Hyperlipidemia with target LDL less than 70    PLAN:    In order of  problems listed above:   Chest tightness Dyspnea on exertion Fatigue Diarrhea - I suspect this may be related to increasing lipitor from 20 mg to 80 mg, despite a gradual increase - will stop lipitor for 2 weeks - restart at 20 mg and follow up with me - will obtain a CXR - she has not tested for COVID and is adamant that she is negative because she doesn't go anywhere - I suspect that if symptoms were due to angina, I would see changes in her EKG, which appears stable - symptoms started 4-5 days ago, she felt much better when she left the hospital - I will also check labs   CAD s/p CABG times x 3 in 2013 - Orbital atherectomy and DES to RCA - Continue aspirin, Plavix, beta-blocker, statin   Hyperlipidemia with LDL goal less than 70 09/01/2020: Cholesterol 143; HDL 70; LDL Cholesterol 62; Triglycerides 57; VLDL 11 - Lipitor was increased from 20 mg to 80 mg with instruction to increase slowly with 40 mg 3 times a week - she feels terrible over the last 4-5 days   Hypertension - 25 mg metoprolol tartrate twice daily   History of renal cell carcinoma -History of partial nephrectomy -She has a concerning lesion on her kidney that needs to be biopsied -She is unable to hold Plavix for at least 6 months   Follow up with me in 2 weeks.  May then consider adding low dose amlodipine.   Medication Adjustments/Labs and Tests Ordered: Current medicines are reviewed at length with the patient today.  Concerns regarding medicines are outlined above.  Orders Placed This Encounter  Procedures   DG Chest 2 View   Basic metabolic panel   CBC   EKG 12-Lead   Meds ordered this encounter  Medications   atorvastatin (LIPITOR) 20 MG tablet    Sig: Take 1 tablet (20 mg total) by mouth daily.    Dispense:  90 tablet    Refill:  3     Signed, Ledora Bottcher, [place]  09/20/2020 4:20 PM    [facility]

## 2020-09-20 ENCOUNTER — Telehealth (HOSPITAL_COMMUNITY): Payer: Self-pay

## 2020-09-20 ENCOUNTER — Encounter: Payer: Self-pay | Admitting: Physician Assistant

## 2020-09-20 ENCOUNTER — Ambulatory Visit
Admission: RE | Admit: 2020-09-20 | Discharge: 2020-09-20 | Disposition: A | Discharge status: Home / Self Care | Payer: PPO | Source: Ambulatory Visit | Attending: Physician Assistant | Admitting: Physician Assistant

## 2020-09-20 ENCOUNTER — Ambulatory Visit: Payer: PPO | Admitting: Physician Assistant

## 2020-09-20 ENCOUNTER — Other Ambulatory Visit (HOSPITAL_COMMUNITY): Payer: Self-pay

## 2020-09-20 ENCOUNTER — Other Ambulatory Visit: Payer: Self-pay

## 2020-09-20 VITALS — BP 140/78 | HR 79 | Ht 64.5 in | Wt 181.2 lb

## 2020-09-20 DIAGNOSIS — I10 Essential (primary) hypertension: Secondary | ICD-10-CM | POA: Diagnosis not present

## 2020-09-20 DIAGNOSIS — R059 Cough, unspecified: Secondary | ICD-10-CM | POA: Diagnosis not present

## 2020-09-20 DIAGNOSIS — R0789 Other chest pain: Secondary | ICD-10-CM

## 2020-09-20 DIAGNOSIS — E782 Mixed hyperlipidemia: Secondary | ICD-10-CM

## 2020-09-20 DIAGNOSIS — R5383 Other fatigue: Secondary | ICD-10-CM

## 2020-09-20 DIAGNOSIS — Z951 Presence of aortocoronary bypass graft: Secondary | ICD-10-CM | POA: Diagnosis not present

## 2020-09-20 DIAGNOSIS — R197 Diarrhea, unspecified: Secondary | ICD-10-CM

## 2020-09-20 DIAGNOSIS — E785 Hyperlipidemia, unspecified: Secondary | ICD-10-CM

## 2020-09-20 DIAGNOSIS — R0609 Other forms of dyspnea: Secondary | ICD-10-CM

## 2020-09-20 DIAGNOSIS — R0600 Dyspnea, unspecified: Secondary | ICD-10-CM

## 2020-09-20 DIAGNOSIS — I700 Atherosclerosis of aorta: Secondary | ICD-10-CM | POA: Diagnosis not present

## 2020-09-20 MED ORDER — ATORVASTATIN CALCIUM 20 MG PO TABS: 20.0000 mg | ORAL_TABLET | Freq: Every day | ORAL | 3 refills | Status: DC

## 2020-09-20 NOTE — Telephone Encounter (Signed)
Pharmacy Transitions of Care Follow-up Telephone Call  Date of discharge: 09/03/20  Discharge Diagnosis: unstable angina  How have you been since you were released from the hospital?  Patient has been experiencing some dizziness and diarrhea since discharge. Has appointment with cardiologist to discuss possibility of it being from meds. Diarrhea may be related to clopidogrel and dizziness may be related to dose increase in atorvastatin.  Medication changes made at discharge:      START taking: clopidogrel (PLAVIX)  nitroGLYCERIN (NITROSTAT)  Icon medications to change how you take   CHANGE how you take: atorvastatin (LIPITOR)  Icon medications to stop taking   STOP taking: Bac 50-325-40 MG tablet (butalbital-acetaminophen-caffeine)   Medication changes verified by the patient? Yes    Medication Accessibility:  Home Pharmacy:  [facility] [address]  Was the patient provided with refills on discharged medications? Yes   Have all prescriptions been transferred from [facility] to home pharmacy? yes   Is the patient able to afford medications? Patient has insurance    Medication Review:  CLOPIDOGREL (PLAVIX) Clopidogrel 75 mg once daily.  - Educated patient on expected duration of therapy of ASA with clopidogrel.  - Advised patient of medications to avoid (NSAIDs, ASA)  - Educated that Tylenol (acetaminophen) will be the preferred analgesic to prevent risk of bleeding  - Emphasized importance of monitoring for signs and symptoms of bleeding (abnormal bruising, prolonged bleeding, nose bleeds, bleeding from gums, discolored urine, black tarry stools)  - Advised patient to alert all providers of anticoagulation therapy prior to starting a new medication or having a procedure    Follow-up Appointments:  PCP Hospital f/u appt confirmed?  Scheduled to see Dr. Redmond School on 12/16/20 @ [facility].   [facility] f/u appt confirmed?  Scheduled to see Dr. Rob Hickman on 09/20/20 @  Cardiology.   If their condition worsens, is the pt aware to call PCP or go to the Emergency Dept.? yes  Final Patient Assessment: Patient has follow up scheduled and refills at home pharmacy

## 2020-09-20 NOTE — Patient Instructions (Addendum)
Medication Instructions:  Stop Lipitor for 2 Weeks *If you need a refill on your cardiac medications before your next appointment, please call your pharmacy*   Lab Work: BMET, CBC If you have labs (blood work) drawn today and your tests are completely normal, you will receive your results only by: [place] (if you have MyChart) OR A paper copy in the mail If you have any lab test that is abnormal or we need to change your treatment, we will call you to review the results.   Testing/Procedures: [facility] [address] A chest x-ray takes a picture of the organs and structures inside the chest, including the heart, lungs, and blood vessels. This test can show several things, including, whether the heart is enlarges; whether fluid is building up in the lungs; and whether pacemaker / defibrillator leads are still in place.    Follow-Up: At [facility], you and your health needs are our priority.  As part of our continuing mission to provide you with exceptional heart care, we have created designated Provider Care Teams.  These Care Teams include your primary Cardiologist (physician) and Advanced Practice Providers (APPs -  Physician Assistants and Nurse Practitioners) who all work together to provide you with the care you need, when you need it.  We recommend signing up for the patient portal called "MyChart".  Sign up information is provided on this After Visit Summary.  MyChart is used to connect with patients for Virtual Visits (Telemedicine).  Patients are able to view lab/test results, encounter notes, upcoming appointments, etc.  Non-urgent messages can be sent to your provider as well.   To learn more about what you can do with MyChart, go to [email or web address].    Your next appointment:   2 week(s)  The format for your next appointment:   In Person  Provider:   Fabian Sharp, PA-C

## 2020-09-21 LAB — CBC
Hematocrit: 38.4 % (ref 34.0–46.6)
Hemoglobin: 12.9 g/dL (ref 11.1–15.9)
MCH: 29.3 pg (ref 26.6–33.0)
MCHC: 33.6 g/dL (ref 31.5–35.7)
MCV: 87 fL (ref 79–97)
Platelets: 217 10*3/uL (ref 150–450)
RBC: 4.4 x10E6/uL (ref 3.77–5.28)
RDW: 13.1 % (ref 11.7–15.4)
WBC: 6.1 10*3/uL (ref 3.4–10.8)

## 2020-09-21 LAB — BASIC METABOLIC PANEL
BUN/Creatinine Ratio: 23 (ref 12–28)
BUN: 21 mg/dL (ref 8–27)
CO2: 24 mmol/L (ref 20–29)
Calcium: 9 mg/dL (ref 8.7–10.3)
Chloride: 105 mmol/L (ref 96–106)
Creatinine, Ser: 0.93 mg/dL (ref 0.57–1.00)
Glucose: 101 mg/dL — ABNORMAL HIGH (ref 65–99)
Potassium: 5 mmol/L (ref 3.5–5.2)
Sodium: 143 mmol/L (ref 134–144)
eGFR: 63 mL/min/{1.73_m2} (ref >59)

## 2020-09-21 NOTE — Progress Notes (Signed)
Cardiology Office Note:    Date:  10/04/2020   ID:  [name] Long, DOB 13-Oct-1940, MRN [id number]  PCP:  [name] Lung, MD  Cardiologist:  [name] Burow, MD   Referring MD: [name] Lung, MD   Chief Complaint  Patient presents with   Follow-up    DOE     History of Present Illness:    [name] Long is a 80 y.o. female with a hx of CAD status post CABG, hypertension, aortic atherosclerosis, asthma, and hyperlipidemia.  She underwent CABG x3 with LIMA-LAD, SVG-OM, SVG-RCA in 2013.  Ischemic eval in 2017 with nonischemic Myoview.  She has a history of renal cell carcinoma status post partial nephrectomy followed by Dr. Gilford Long.  She presented to [facility] on 08/31/2020 with acute onset anterior chest pain with radiation to left jaw and shoulder.  Troponins remained negative.  She underwent left heart catheterization total occlusion of SVG to RCA, patent LIMA-LAD, and patent SVG-OM.  She had 85% left main stenosis with 85 to 90% proximal LAD followed by 100% occlusion after the D1.  RCA with 50 to 70% eccentric calcification.  Medical therapy was pursued.  Unfortunately she did not tolerate Imdur and had refractory chest pain.  She was taken back to the lab for IVUS-guided orbital atherectomy and DES distal to the treated area of the RCA felt to be wire bias, no additional PCI.  She was started on DAPT with aspirin and Plavix.  She was continued on beta-blocker and statin was increased.  If antianginal is needed would consider amlodipine. I saw her for post-hospital follow up on 09/20/20 and she felt "terrible" - much worse than right after discharge. This may have coincided with increasing her lipitor. I instructed a statin holiday with return in 2 weeks to evaluation.   She returns today for follow-up. The majority of her symptoms resolved when she stopped lipitor. She feels much better, but is still battling DOE and chest pain. For example, she leaned back at the hair dresser and had nagging chest  pain for the rest of the day. We discussed possible etiologies of DOE and chest discomfort. CXR was clear. I will add low dose amlodipine to see if this helps her symptoms. She is very concerned about the progression of disease. I have suggested PCSK9i with pharmD.   Past Medical History:  Diagnosis Date   Abnormal EKG 01/12/2011   Allergy    RHINITIS   Arthritis    neck, back knees, hips   Asthma    Asthma 01/12/2011   Back pain, chronic--plans were for cervical disc surgery week of 01/16/11 01/12/2011   Blood clot in vein 2004   L leg   Blood transfusion without reported diagnosis 2004   Chronic low back pain    Complication of anesthesia    post op bladder retention after surgery   Connective tissue disease ([place])    Coronary artery disease    Crescendo angina, with EKG changes, new. 01/12/2011   Dyspnea    Fibromyalgia    Fibromyalgia    GERD (gastroesophageal reflux disease)    Headache    migraine - several in a yr.    History of stress test 04/2011   Normal stress test   Hx of echocardiogram 07/19/2011   EF 50-55%. There is mild annular calcification, there is trace migtral regurgitation, there is trace tricupid regurgitation, pericardium is thick, there is no pericardial effusion.   Hypertension    Myocardial infarction [facility]) 2013  triple heart bypass   Neuromuscular disorder (HCC)    fibromyalgia    Osteoporosis    OSTEOPENIA   Personal history of colonic polyp - adenoma 08/05/2013   08/2013 - 7 mm adenoma - consider repeat colonoscopy 2020 (age 50)   Pneumonia 2018   numerous times, only admitted one time for pneumonia but she has had pneumonia numerous times;   02-19-2018 END OF NOVEMBER 2019  WAS ADMITTED FOR ABOUT  A WEEK DUE TO "DOUBLE PNEUMONIA", REPORTS TODAY FEELING BACK TO HER NORMAL , DENIES SOB, O2 SAT AT 30 ROOM AIR , AFEBRILE, OTHER VSS    PONV (postoperative nausea and vomiting)    PPD positive    Status post coronary artery bypass grafting    Urinary  incontinence    post op, "sleepy bladder", urinar retention     Past Surgical History:  Procedure Laterality Date   [place] Left 03/27/2019   Benign fibroaipose tissue no evidence of malignancy   BLADDER REPAIR  1985   BREAST SURGERY  1992   REDUCTION, x3 surgeries overall on breast for augmentation    CARDIAC CATHETERIZATION  2013   CARPAL TUNNEL RELEASE Bilateral 1988, [place]   CORONARY ARTERY BYPASS GRAFT  01/14/2011   Procedure: CORONARY ARTERY BYPASS GRAFTING (CABG);  Surgeon: Tharon Aquas Adelene Idler, MD;  Location: [place];  Service: Open Heart Surgery;  Laterality: N/A;  CABG x three, using right greater saphenous vein harvested endoscopically   CORONARY ATHERECTOMY N/A 09/02/2020   Procedure: CORONARY ATHERECTOMY;  Surgeon: Martinique, Peter M, MD;  Location: [place] CV [facility];  Service: Cardiovascular;  Laterality: N/A;   CORONARY STENT INTERVENTION N/A 09/02/2020   Procedure: CORONARY STENT INTERVENTION;  Surgeon: Martinique, Peter M, MD;  Location: [place] CV LAB;  Service: Cardiovascular;  Laterality: N/A;   EYE SURGERY Bilateral 1985   radiokerototomy   INTRAVASCULAR LITHOTRIPSY  09/02/2020   Procedure: INTRAVASCULAR LITHOTRIPSY;  Surgeon: Martinique, Peter M, MD;  Location: [place] CV LAB;  Service: Cardiovascular;;   INTRAVASCULAR ULTRASOUND/IVUS N/A 09/02/2020   Procedure: Intravascular Ultrasound/IVUS;  Surgeon: Martinique, Peter M, MD;  Location: [place] CV LAB;  Service: Cardiovascular;  Laterality: N/A;   LEFT HEART CATH AND CORS/GRAFTS ANGIOGRAPHY N/A 08/31/2020   Procedure: LEFT HEART CATH AND CORS/GRAFTS ANGIOGRAPHY;  Surgeon: Belva Crome, MD;  Location: [place] [facility];  Service: Cardiovascular;  Laterality: N/A;   LEFT HEART CATHETERIZATION WITH CORONARY ANGIOGRAM N/A 01/13/2011   Procedure: LEFT HEART CATHETERIZATION WITH CORONARY ANGIOGRAM;  Surgeon: Troy Sine, MD;  Location:  [facility] C[facility];  Service: Cardiovascular;  Laterality: N/A;   PUBOVAGINAL SLING  2002   ROBOT ASSISTED LAPAROSCOPIC NEPHRECTOMY Right 02/27/2018   Procedure: XI ROBOTIC ASSISTE PARTIAL NEPHRECTOMY;  Surgeon: Ceasar Mons, MD;  Location: [facility];  Service: Urology;  Laterality: Right;   STERNAL WOUND DEBRIDEMENT  07/27/2011   Procedure: STERNAL WOUND DEBRIDEMENT;  followed by wound vac Surgeon: Ivin Poot, MD;  Location: [place];  Service: Open Heart Surgery;  Laterality: N/A;   TONSILLECTOMY  1952   TOTAL HIP ARTHROPLASTY Right 08/22/2016   Procedure: TOTAL HIP ARTHROPLASTY ANTERIOR APPROACH;  Surgeon: Melrose Nakayama, MD;  Location: [place];  Service: Orthopedics;  Laterality: Right;   TOTAL KNEE ARTHROPLASTY Right 07/22/2019   Procedure: RIGHT TOTAL KNEE ARTHROPLASTY;  Surgeon: Melrose Nakayama, MD;  Location: [facility];  Service: Orthopedics;  Laterality: Right;   TRIGGER FINGER RELEASE Right 2014   4th finger   TROCHANTERIC BURSA EXCISION Right    bursa removed, R hip   TUBAL LIGATION      Current Medications: Current Meds  Medication Sig   albuterol (PROVENTIL HFA) 108 (90 Base) MCG/ACT inhaler INHALE 2 PUFF AS DIRECTED FOUR TIMES A DAY AS NEEDED (Patient taking differently: Inhale 1 puff into the lungs every 6 (six) hours as needed for wheezing or shortness of breath.)   amLODipine (NORVASC) 5 MG tablet Take 1 tablet (5 mg total) by mouth daily.   aspirin EC 81 MG tablet Take 81 mg by mouth daily. Swallow whole.   Calcium Carb-Cholecalciferol (CALTRATE 600+D3 PO) Take 1 tablet by mouth daily.   carisoprodol (SOMA) 350 MG tablet Take 350 mg by mouth every 8 (eight) hours as needed (for arthritis pain).    clopidogrel (PLAVIX) 75 MG tablet Take 1 tablet (75 mg total) by mouth daily with breakfast.   Fluticasone-Salmeterol (ADVAIR DISKUS) 500-50 MCG/DOSE AEPB Inhale 1 puff into the lungs 2 (two) times daily.   gabapentin (NEURONTIN) 600 MG tablet Take 1,200-1,800 mg by mouth See  admin instructions. Take 2 tablets (1200 mg) by mouth in the morning & take 3 tablets (1800 mg) by mouth at night   HYDROmorphone (DILAUDID) 4 MG tablet Take 1 tablet (4 mg total) by mouth every 4 (four) hours as needed for severe pain.   magnesium oxide (MAG-OX) 400 MG tablet Take 400 mg by mouth at bedtime.   metoprolol tartrate (LOPRESSOR) 25 MG tablet Take 1 tablet (25 mg total) by mouth 2 (two) times daily.   montelukast (SINGULAIR) 10 MG tablet TAKE ONE TABLET BY MOUTH DAILY (Patient taking differently: Take 10 mg by mouth daily.)   Multiple Vitamins-Minerals (PRESERVISION AREDS 2 PO) Take 1 tablet by mouth in the morning and at bedtime.   nitroGLYCERIN (NITROSTAT) 0.4 MG SL tablet Place 1 tablet (0.4 mg total) under the tongue every 5 (five) minutes x 3 doses as needed for chest pain.   silodosin (RAPAFLO) 8 MG CAPS capsule Take 8 mg by mouth daily.    [DISCONTINUED] atorvastatin (LIPITOR) 20 MG tablet Take 1 tablet (20 mg total) by mouth daily.     Allergies:   Dimenhydrinate, Morphine and related, Percocet [oxycodone-acetaminophen], and Zolpidem   Social History   Socioeconomic History   Marital status: Married    Spouse name: Not on file   Number of children: Not on file   Years of education: Not on file   Highest education level: Not on file  Occupational History   Not on file  Tobacco Use   Smoking status: Former    Types: Cigarettes    Quit date: 01/12/1980    Years since quitting: 40.7   Smokeless tobacco: Never  Vaping Use   Vaping Use: Never used  Substance and Sexual Activity   Alcohol use: No    Alcohol/week: 0.0 standard drinks   Drug use: No   Sexual activity: Yes  Other Topics Concern   Not on file  Social History Narrative   Not on file   Social Determinants of Health   Financial Resource Strain: Not on file  Food Insecurity: Not on file  Transportation Needs: Not on file  Physical Activity: Not on file  Stress: Not on file  Social Connections: Not  on file     Family History: The patient's family history includes Hypertension in her brother. She was  adopted.  ROS:   Please see the history of present illness.     All other systems reviewed and are negative.  EKGs/Labs/Other Studies Reviewed:    The following studies were reviewed today:  Stent and intravascular lithotripsy 09/02/20:   Prox RCA lesion is 80% stenosed.   A drug-eluting stent was successfully placed using a SYNERGY XD 3.0X24.   Post intervention, there is a 0% residual stenosis.   Successful PCI of the proximal RCA with IVUS guidance, orbital atherectomy, Shockwave lithotripsy and DES x 1   Plan: DAPT for one year. If stable anticipate DC in am. _____________   Left heart cath 08/31/20:   Total occlusion of SVG to RCA-PL.   Patent LIMA to LAD.   Patent SVG to OM.   85% heavily calcified distal left main.   85 to 90% proximal LAD before the first diagonal, followed by 100% occlusion in the mid vessel.   60 to 70% ostial to proximal circumflex.  Competitive flow was noted in the circumflex due to the widely patent saphenous vein graft.   Right coronary is dominant, heavily calcified, and tortuous.  In the proximal vessel within a right angle bend there is 50 to 70% obstruction with eccentric calcification.  The mid RCA contains focal eccentric 60 to 70% stenosis.   LV function is normal.  EF is 60%.  LVEDP is 12 mmHg.   Plan: Medical therapy If recurring symptoms compatible with angina, consider therapy of the right coronary proximal and mid segment.  This would require plaque modification to get full stent expansion.  Would likely be difficult to perform due to tortuosity.  May be more appropriately managed from a femoral approach.  Discussed with Dr. Ellyn Hack.   _____________   Echo 8/31/122: 1. Left ventricular ejection fraction, by estimation, is 55 to 60%. The  left ventricle has normal function. The left ventricle has no regional  wall motion  abnormalities. There is mild concentric left ventricular  hypertrophy. Left ventricular diastolic  parameters are indeterminate.   2. Right ventricular systolic function is low normal. The right  ventricular size is normal.   3. The mitral valve is grossly normal. Trivial mitral valve  regurgitation. No evidence of mitral stenosis.   4. The aortic valve has an indeterminant number of cusps. Aortic valve  regurgitation is not visualized. No aortic stenosis is present.   5. The inferior vena cava is normal in size with greater than 50%  respiratory variability, suggesting right atrial pressure of 3 mmHg.   EKG:  EKG is not ordered today.   Recent Labs: 08/31/2020: ALT 17 09/20/2020: BUN 21; Creatinine, Ser 0.93; Hemoglobin 12.9; Platelets 217; Potassium 5.0; Sodium 143  Recent Lipid Panel    Component Value Date/Time   CHOL 143 09/01/2020 0241   CHOL 170 05/03/2020 0910   TRIG 57 09/01/2020 0241   HDL 70 09/01/2020 0241   HDL 82 05/03/2020 0910   CHOLHDL 2.0 09/01/2020 0241   VLDL 11 09/01/2020 0241   LDLCALC 62 09/01/2020 0241   LDLCALC 75 05/03/2020 0910    Physical Exam:    VS:  BP (!) 142/82   Pulse 90   Ht 5' 4.5" (1.638 Long)   Wt 181 lb 3.2 oz (82.2 kg)   SpO2 97%   BMI 30.62 kg/Long     Wt Readings from Last 3 Encounters:  10/04/20 181 lb 3.2 oz (82.2 kg)  09/20/20 181 lb 3.2 oz (82.2 kg)  09/03/20 178 lb 8 oz (  81 kg)     GEN:  Well nourished, well developed in no acute distress HEENT: Normal NECK: No JVD; No carotid bruits LYMPHATICS: No lymphadenopathy CARDIAC: RRR, no murmurs, rubs, gallops RESPIRATORY:  Clear to auscultation without rales, wheezing or rhonchi  ABDOMEN: Soft, non-tender, non-distended MUSCULOSKELETAL:  No edema; No deformity  SKIN: Warm and dry NEUROLOGIC:  Alert and oriented x 3 PSYCHIATRIC:  Normal affect   ASSESSMENT:    1. Mixed hyperlipidemia   2. S/P CABG x 3: (lima-lad,svg-om,svg-rca)   3. Hyperlipidemia with target LDL less than  70   4. Chest tightness   5. Dyspnea on exertion   6. Essential hypertension   7. Renal cell carcinoma, unspecified laterality (HCC)    PLAN:    In order of problems listed above:  Chest pain DOE CAD s/p CABG times 03/2011 - Orbital atherectomy and DES to RCA - Continue aspirin, Plavix, beta-blocker, statin - she did not tolerate imdur - start 2.5 mg amlodipine --> increase to 5mg  in 1 week   Hyperlipidemia with LDL goal less than 70 09/01/2020: Cholesterol 143; HDL 70; LDL Cholesterol 62; Triglycerides 57; VLDL 11 - Lipitor was increased from 20 mg to 80 mg with instruction to increase slowly with 40 mg 3 times a week --> this coincided with severe side effects - all SE resolved with discontinuation of statin - will go back on previous dose of 20 mg lipitor and refer to pharmD for PCSK9i   Hypertension - 25 mg metoprolol tartrate twice daily - pressure generally 182 systolic at home - adding amlodipine at night   History of renal cell carcinoma - History of partial nephrectomy - She has a concerning lesion on her kidney that needs to be biopsied - She is unable to hold Plavix for at least 6 months - will re-evaluate in Feb 2023   Follow up with me in 4-6 weeks.    Medication Adjustments/Labs and Tests Ordered: Current medicines are reviewed at length with the patient today.  Concerns regarding medicines are outlined above.  Orders Placed This Encounter  Procedures   AMB Referral to [facility]   Meds ordered this encounter  Medications   atorvastatin (LIPITOR) 20 MG tablet    Sig: Take 1 tablet (20 mg total) by mouth daily.    Dispense:  90 tablet    Refill:  3   amLODipine (NORVASC) 5 MG tablet    Sig: Take 1 tablet (5 mg total) by mouth daily.    Dispense:  90 tablet    Refill:  3    Signed, Ledora Bottcher, [place]  10/04/2020 12:17 PM    [facility]

## 2020-09-29 ENCOUNTER — Other Ambulatory Visit (HOSPITAL_COMMUNITY): Payer: Self-pay

## 2020-10-04 ENCOUNTER — Encounter: Payer: Self-pay | Admitting: Physician Assistant

## 2020-10-04 ENCOUNTER — Ambulatory Visit: Payer: PPO | Admitting: Physician Assistant

## 2020-10-04 ENCOUNTER — Other Ambulatory Visit: Payer: Self-pay

## 2020-10-04 VITALS — BP 142/82 | HR 90 | Ht 64.5 in | Wt 181.2 lb

## 2020-10-04 DIAGNOSIS — E785 Hyperlipidemia, unspecified: Secondary | ICD-10-CM

## 2020-10-04 DIAGNOSIS — M47812 Spondylosis without myelopathy or radiculopathy, cervical region: Secondary | ICD-10-CM | POA: Diagnosis not present

## 2020-10-04 DIAGNOSIS — C649 Malignant neoplasm of unspecified kidney, except renal pelvis: Secondary | ICD-10-CM

## 2020-10-04 DIAGNOSIS — R0789 Other chest pain: Secondary | ICD-10-CM | POA: Diagnosis not present

## 2020-10-04 DIAGNOSIS — Z951 Presence of aortocoronary bypass graft: Secondary | ICD-10-CM

## 2020-10-04 DIAGNOSIS — I10 Essential (primary) hypertension: Secondary | ICD-10-CM

## 2020-10-04 DIAGNOSIS — R0609 Other forms of dyspnea: Secondary | ICD-10-CM

## 2020-10-04 DIAGNOSIS — E782 Mixed hyperlipidemia: Secondary | ICD-10-CM | POA: Diagnosis not present

## 2020-10-04 DIAGNOSIS — G894 Chronic pain syndrome: Secondary | ICD-10-CM | POA: Diagnosis not present

## 2020-10-04 DIAGNOSIS — M150 Primary generalized (osteo)arthritis: Secondary | ICD-10-CM | POA: Diagnosis not present

## 2020-10-04 DIAGNOSIS — Z79891 Long term (current) use of opiate analgesic: Secondary | ICD-10-CM | POA: Diagnosis not present

## 2020-10-04 MED ORDER — AMLODIPINE BESYLATE 5 MG PO TABS: 5.0000 mg | ORAL_TABLET | Freq: Every day | ORAL | 3 refills | Status: DC

## 2020-10-04 MED ORDER — ATORVASTATIN CALCIUM 20 MG PO TABS: 20.0000 mg | ORAL_TABLET | Freq: Every day | ORAL | 3 refills | Status: DC

## 2020-10-04 NOTE — Patient Instructions (Addendum)
Medication Instructions:  START Amlodipine 5 mg daily. For 1 week take 2.5 mg (half tablet) day then increase to 5 mg (whole tablet) daily *If you need a refill on your cardiac medications before your next appointment, please call your pharmacy*  Lab Work: NONE ordered at this time of appointment   If you have labs (blood work) drawn today and your tests are completely normal, you will receive your results only by: [facility] (if you have MyChart) OR A paper copy in the mail If you have any lab test that is abnormal or we need to change your treatment, we will call you to review the results.  Testing/Procedures: NONE ordered at this time of appointment   Follow-Up: At [facility], you and your health needs are our priority.  As part of our continuing mission to provide you with exceptional heart care, we have created designated Provider Care Teams.  These Care Teams include your primary Cardiologist (physician) and Advanced Practice Providers (APPs -  Physician Assistants and Nurse Practitioners) who all work together to provide you with the care you need, when you need it.  Your next appointment:   1st available  6 Week(s)  The format for your next appointment:   In Person In Person   Provider:   Pharm D Fabian Sharp, PA-C or APP  Other Instructions

## 2020-10-06 ENCOUNTER — Other Ambulatory Visit (HOSPITAL_BASED_OUTPATIENT_CLINIC_OR_DEPARTMENT_OTHER): Payer: Self-pay

## 2020-10-21 DIAGNOSIS — D492 Neoplasm of unspecified behavior of bone, soft tissue, and skin: Secondary | ICD-10-CM | POA: Diagnosis not present

## 2020-10-21 DIAGNOSIS — C44722 Squamous cell carcinoma of skin of right lower limb, including hip: Secondary | ICD-10-CM | POA: Diagnosis not present

## 2020-10-21 DIAGNOSIS — L821 Other seborrheic keratosis: Secondary | ICD-10-CM | POA: Diagnosis not present

## 2020-10-22 ENCOUNTER — Ambulatory Visit: Payer: PPO | Admitting: Pharmacist

## 2020-10-22 ENCOUNTER — Telehealth: Payer: Self-pay | Admitting: Pharmacist

## 2020-10-22 ENCOUNTER — Other Ambulatory Visit: Payer: Self-pay

## 2020-10-22 DIAGNOSIS — E785 Hyperlipidemia, unspecified: Secondary | ICD-10-CM

## 2020-10-22 DIAGNOSIS — I25709 Atherosclerosis of coronary artery bypass graft(s), unspecified, with unspecified angina pectoris: Secondary | ICD-10-CM

## 2020-10-22 NOTE — Progress Notes (Signed)
Patient ID: [name] Long                 DOB: 1940-03-11                    MRN: [id number]     HPI: [name] Long is a 80 y.o. female patient referred to lipid clinic by Fabian Sharp. PMH is significant for CAD post CABG x 3, HTN, angina, HLD, asthma, and aortic atherosclerosis.  Patient reports frequent SOB.  Patient presents today with multiple questions regarding CAD and HLD.  LDL and triglycerides have been at goal she reports.  Currently takes atorvastatin 20mg  daily, could not tolerate higher doses.  Likes to work in her garden frequently but is concerned now due to SOB and intermittent chest pain.    Is concerned regarding price of any additional therapies.  Current Medications: atorvastatin 20mg  daily Intolerances: atorvastatin 40mg /80mg   Risk Factors: CAD, hx of CABG, HTN LDL goal: <70  Labs: TC 143, HDL 70, LDL 62, Trigs 57  (09/01/20 - atorvastatin 20)  Past Medical History:  Diagnosis Date   Abnormal EKG 01/12/2011   Allergy    RHINITIS   Arthritis    neck, back knees, hips   Asthma    Asthma 01/12/2011   Back pain, chronic--plans were for cervical disc surgery week of 01/16/11 01/12/2011   Blood clot in vein 2004   L leg   Blood transfusion without reported diagnosis 2004   Chronic low back pain    Complication of anesthesia    post op bladder retention after surgery   Connective tissue disease ([place])    Coronary artery disease    Crescendo angina, with EKG changes, new. 01/12/2011   Dyspnea    Fibromyalgia    Fibromyalgia    GERD (gastroesophageal reflux disease)    Headache    migraine - several in a yr.    History of stress test 04/2011   Normal stress test   Hx of echocardiogram 07/19/2011   EF 50-55%. There is mild annular calcification, there is trace migtral regurgitation, there is trace tricupid regurgitation, pericardium is thick, there is no pericardial effusion.   Hypertension    Myocardial infarction [facility]) 2013   triple heart bypass   Neuromuscular  disorder (HCC)    fibromyalgia    Osteoporosis    OSTEOPENIA   Personal history of colonic polyp - adenoma 08/05/2013   08/2013 - 7 mm adenoma - consider repeat colonoscopy 2020 (age 45)   Pneumonia 2018   numerous times, only admitted one time for pneumonia but she has had pneumonia numerous times;   02-19-2018 END OF NOVEMBER 2019  WAS ADMITTED FOR ABOUT  A WEEK DUE TO "DOUBLE PNEUMONIA", REPORTS TODAY FEELING BACK TO HER NORMAL , DENIES SOB, O2 SAT AT 16 ROOM AIR , AFEBRILE, OTHER VSS    PONV (postoperative nausea and vomiting)    PPD positive    Status post coronary artery bypass grafting    Urinary incontinence    post op, "sleepy bladder", urinar retention     Current Outpatient Medications on File Prior to Visit  Medication Sig Dispense Refill   albuterol (PROVENTIL HFA) 108 (90 Base) MCG/ACT inhaler INHALE 2 PUFF AS DIRECTED FOUR TIMES A DAY AS NEEDED (Patient taking differently: Inhale 1 puff into the lungs every 6 (six) hours as needed for wheezing or shortness of breath.) 6.7 g 1   amLODipine (NORVASC) 5 MG tablet Take 1 tablet (5 mg  total) by mouth daily. 90 tablet 3   aspirin EC 81 MG tablet Take 81 mg by mouth daily. Swallow whole.     atorvastatin (LIPITOR) 20 MG tablet Take 1 tablet (20 mg total) by mouth daily. 90 tablet 3   Calcium Carb-Cholecalciferol (CALTRATE 600+D3 PO) Take 1 tablet by mouth daily.     carisoprodol (SOMA) 350 MG tablet Take 350 mg by mouth every 8 (eight) hours as needed (for arthritis pain).      clopidogrel (PLAVIX) 75 MG tablet Take 1 tablet (75 mg total) by mouth daily with breakfast. 90 tablet 3   Fluticasone-Salmeterol (ADVAIR DISKUS) 500-50 MCG/DOSE AEPB Inhale 1 puff into the lungs 2 (two) times daily. 3 each 3   gabapentin (NEURONTIN) 600 MG tablet Take 1,200-1,800 mg by mouth See admin instructions. Take 2 tablets (1200 mg) by mouth in the morning & take 3 tablets (1800 mg) by mouth at night     HYDROmorphone (DILAUDID) 4 MG tablet Take 1 tablet  (4 mg total) by mouth every 4 (four) hours as needed for severe pain. 10 tablet 0   magnesium oxide (MAG-OX) 400 MG tablet Take 400 mg by mouth at bedtime.     metoprolol tartrate (LOPRESSOR) 25 MG tablet Take 1 tablet (25 mg total) by mouth 2 (two) times daily. 180 tablet 3   montelukast (SINGULAIR) 10 MG tablet TAKE ONE TABLET BY MOUTH DAILY (Patient taking differently: Take 10 mg by mouth daily.) 90 tablet 1   Multiple Vitamins-Minerals (PRESERVISION AREDS 2 PO) Take 1 tablet by mouth in the morning and at bedtime.     nitroGLYCERIN (NITROSTAT) 0.4 MG SL tablet Place 1 tablet (0.4 mg total) under the tongue every 5 (five) minutes x 3 doses as needed for chest pain. 25 tablet 1   silodosin (RAPAFLO) 8 MG CAPS capsule Take 8 mg by mouth daily.      No current facility-administered medications on file prior to visit.    Allergies  Allergen Reactions   Dimenhydrinate Other (See Comments)    "locks up kidneys"   Morphine And Related Nausea And Vomiting   Percocet [Oxycodone-Acetaminophen] Other (See Comments)    hallucinations   Zolpidem Other (See Comments)    Unusual behavior    Assessment/Plan:  1. Hyperlipidemia - Patient most recent LDL 62 which is at goal of <70. However patient is recently post CABG and possible MI while on atorvastatin 20.    Described in detail the pathophysiology of plaque formation and rupture.  Played patient friednly video to help illustrate. Patient understood.  [employer] prefers Repatha.  Using demo pen, educated patient on mechanism of action, storage, site selection, administration, and possible adverse effects. Patient understood how to administer and is comfortable since she previously had to give herself allergy injections.  Will complete PA and contact patient when complete.  Repeat lipid panel in 2-3 months.  Continue atorvastatin 20mg  daily Start Repatha 140mg  q 14 days Recheck lipid panel in 2-3 months  Karren Cobble, PharmD, [place],  [place], [place], [place], [place], [address] Phone: [phone], Fax: [phone]

## 2020-10-22 NOTE — Patient Instructions (Signed)
It was nice meeting you today  I would like you to continue your atorvastatin 20mg  once a day  We will start a new medication called Repatha 140mg  which you will inject every 2 weeks  I will complete your prior authorization and contact you when it is approved  One you start the medication, we will recheck your levels in 2-3 months  Please call with any questions!  Karren Cobble, PharmD, BCACP, [place], [place], [place], [place], [address] Phone: [phone], Fax: [phone]

## 2020-10-22 NOTE — Telephone Encounter (Signed)
Please complete prior authorization for:  Name of medication, dose, and frequency Repatha 140mg  sq q 14 days  Lab Orders Requested? yes  Which labs? Lipid panel  Estimated date for labs to be scheduled 2-3 months  Does patient need activated copay card? no

## 2020-10-26 MED ORDER — REPATHA SURECLICK 140 MG/ML ~~LOC~~ SOAJ: 140.0000 mg | SUBCUTANEOUS | 11 refills | Status: DC

## 2020-10-26 NOTE — Telephone Encounter (Signed)
Pa for repatha 140mg  q14d submitted [name] Long (Key: [id number]) Lipid panel ordered and released

## 2020-10-26 NOTE — Telephone Encounter (Signed)
Called and spoke w/pt and stated that they were approved for repatha, rx sent, instructed pt to complete fasting labs and to notify us if the medication was cost prohibitive. Pt voiced understanding

## 2020-10-26 NOTE — Addendum Note (Signed)
Addended by: Allean Found on: 10/26/2020 04:41 PM   Modules accepted: Orders

## 2020-11-08 ENCOUNTER — Encounter: Payer: Self-pay | Admitting: Family Medicine

## 2020-11-08 ENCOUNTER — Encounter: Payer: Self-pay | Admitting: Neurology

## 2020-11-08 ENCOUNTER — Ambulatory Visit (INDEPENDENT_AMBULATORY_CARE_PROVIDER_SITE_OTHER): Payer: PPO | Admitting: Family Medicine

## 2020-11-08 ENCOUNTER — Other Ambulatory Visit: Payer: Self-pay

## 2020-11-08 VITALS — BP 142/90 | HR 91 | Temp 98.9°F | Wt 184.8 lb

## 2020-11-08 DIAGNOSIS — Z951 Presence of aortocoronary bypass graft: Secondary | ICD-10-CM | POA: Diagnosis not present

## 2020-11-08 DIAGNOSIS — M549 Dorsalgia, unspecified: Secondary | ICD-10-CM

## 2020-11-08 DIAGNOSIS — G8929 Other chronic pain: Secondary | ICD-10-CM | POA: Diagnosis not present

## 2020-11-08 DIAGNOSIS — M5481 Occipital neuralgia: Secondary | ICD-10-CM | POA: Diagnosis not present

## 2020-11-08 DIAGNOSIS — Z23 Encounter for immunization: Secondary | ICD-10-CM

## 2020-11-08 NOTE — Progress Notes (Signed)
   Subjective:    Patient ID: [name] Long, female    DOB: [birth year], 80 y.o.   MRN: [id number]  HPI She is here for evaluation of a 10-day history of occipital headache.  She can wake up in the morning feeling fairly good and then as the day progresses she gets bilateral occipital pain with occasional ear pain.  She did have a stent placed September 1.  She did have some jaw pain prior to this but the pain went away when she had the stent.  She also has a history of chronic back pain and is being given a muscle relaxer as well as Dilaudid.  Apparently she has been intermittently using these meds with no success controlling her headache.  No blurred or double vision nausea or vomiting.  She is under no undue stress.   Review of Systems     Objective:   Physical Exam Alert and in no distress.  Tender to palpation in the occipital area bilaterally.  Pain on motion of the neck.  Normal strength and sensation with DTRs.  TMs clear.  Neck is supple without adenopathy.       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 Heat for 20 minutes 3 times per day with gentle stretching after that.  You can take 2 of the 500 mg pills 3 times per day.  You can also take more of the muscle relaxer but be careful because it can make you drowsy.  Dilaudid should be used very sparingly

## 2020-11-08 NOTE — Patient Instructions (Signed)
Heat for 20 minutes 3 times per day with gentle stretching after that.  You can take 2 of the 500 mg pills 3 times per day.  You can also take more of the muscle relaxer but be careful because it can make you drowsy.  Dilaudid should be used very sparingly

## 2020-11-10 NOTE — Progress Notes (Signed)
Cardiology Office Note:    Date:  11/18/2020   ID:  [name] Long, DOB 02-May-1940, MRN [id number]  PCP:  Denita Lung, MD  Cardiologist:  Quay Burow, MD   Referring MD: Denita Lung, MD   Chief Complaint  Patient presents with   Follow-up    Dyspnea on exertion, CAD    History of Present Illness:    [name] Long is a 80 y.o. female with a hx of CAD status post CABG, hypertension, aortic atherosclerosis, asthma, and hyperlipidemia.  She underwent CABG x3 with LIMA-LAD, SVG-OM, SVG-RCA in 2013.  Ischemic eval in 2017 with nonischemic Myoview.  She has a history of renal cell carcinoma status post partial nephrectomy followed by Dr. Gilford Rile.  She presented to [facility] on 08/31/2020 with acute onset anterior chest pain with radiation to left jaw and shoulder.  Troponins remained negative.  She underwent left heart catheterization total occlusion of SVG to RCA, patent LIMA-LAD, and patent SVG-OM.  She had 85% left main stenosis with 85 to 90% proximal LAD followed by 100% occlusion after the D1.  RCA with 50 to 70% eccentric calcification.  Medical therapy was pursued.  Unfortunately she did not tolerate Imdur and had refractory chest pain.  She was taken back to the lab for IVUS-guided orbital atherectomy and lesion distal to the treated area of the RCA felt to be wire bias, no additional PCI.  She was started on DAPT with aspirin and Plavix.  She was continued on beta-blocker and statin was increased.  If antianginal is needed would consider amlodipine. I saw her for post-hospital follow up on 09/20/20 and she felt "terrible" - much worse than right after discharge. This may have coincided with increasing her lipitor. I instructed a statin holiday. She felt much better when she returned to 20 mg lipitor. I referred her to lipid clinic who started repatha.   She returns today for follow-up. She has seen PCP for bilateral occipital neuralgia and is being referred to neurology.  She has spoken with  Pharm.D. regarding debilitating headaches after taking Repatha.  They discussed possibilities and she is willing to try Repatha again.  She continues to have dyspnea on exertion, however she is doing strenuous yard work including mowing, edging, and raking leaves.  We discussed not overdoing it in the yard and taking breaks.  She does not have any restrictions.  I will refill her Plavix today.  We reviewed her last heart cath.  I suspect her dyspnea on exertion is likely angina.  We discussed possibly low-dose amlodipine versus Ranexa.  However, I will hold off on both of these while she is having headaches with Repatha.   Past Medical History:  Diagnosis Date   Abnormal EKG 01/12/2011   Allergy    RHINITIS   Arthritis    neck, back knees, hips   Asthma    Asthma 01/12/2011   Back pain, chronic--plans were for cervical disc surgery week of 01/16/11 01/12/2011   Blood clot in vein 2004   L leg   Blood transfusion without reported diagnosis 2004   Chronic low back pain    Complication of anesthesia    post op bladder retention after surgery   Connective tissue disease ([place])    Coronary artery disease    Crescendo angina, with EKG changes, new. 01/12/2011   Dyspnea    Fibromyalgia    Fibromyalgia    GERD (gastroesophageal reflux disease)    Headache    migraine -  several in a yr.    History of stress test 04/2011   Normal stress test   Hx of echocardiogram 07/19/2011   EF 50-55%. There is mild annular calcification, there is trace migtral regurgitation, there is trace tricupid regurgitation, pericardium is thick, there is no pericardial effusion.   Hypertension    Myocardial infarction [facility]) 2013   triple heart bypass   Neuromuscular disorder (HCC)    fibromyalgia    Osteoporosis    OSTEOPENIA   Personal history of colonic polyp - adenoma 08/05/2013   08/2013 - 7 mm adenoma - consider repeat colonoscopy 2020 (age 58)   Pneumonia 2018   numerous times, only admitted one time for  pneumonia but she has had pneumonia numerous times;   02-19-2018 END OF NOVEMBER 2019  WAS ADMITTED FOR ABOUT  A WEEK DUE TO "DOUBLE PNEUMONIA", REPORTS TODAY FEELING BACK TO HER NORMAL , DENIES SOB, O2 SAT AT 52 ROOM AIR , AFEBRILE, OTHER VSS    PONV (postoperative nausea and vomiting)    PPD positive    Status post coronary artery bypass grafting    Urinary incontinence    post op, "sleepy bladder", urinar retention     Past Surgical History:  Procedure Laterality Date   [place] Left 03/27/2019   Benign fibroaipose tissue no evidence of malignancy   BLADDER REPAIR  1985   BREAST SURGERY  1992   REDUCTION, x3 surgeries overall on breast for augmentation    CARDIAC CATHETERIZATION  2013   CARPAL TUNNEL RELEASE Bilateral 1988, [place]   CORONARY ARTERY BYPASS GRAFT  01/14/2011   Procedure: CORONARY ARTERY BYPASS GRAFTING (CABG);  Surgeon: Tharon Aquas Adelene Idler, MD;  Location: [place];  Service: Open Heart Surgery;  Laterality: N/A;  CABG x three, using right greater saphenous vein harvested endoscopically   CORONARY ATHERECTOMY N/A 09/02/2020   Procedure: CORONARY ATHERECTOMY;  Surgeon: Martinique, Peter M, MD;  Location: [place] CV LAB;  Service: Cardiovascular;  Laterality: N/A;   CORONARY STENT INTERVENTION N/A 09/02/2020   Procedure: CORONARY STENT INTERVENTION;  Surgeon: Martinique, Peter M, MD;  Location: [place] CV LAB;  Service: Cardiovascular;  Laterality: N/A;   EYE SURGERY Bilateral 1985   radiokerototomy   INTRAVASCULAR LITHOTRIPSY  09/02/2020   Procedure: INTRAVASCULAR LITHOTRIPSY;  Surgeon: Martinique, Peter M, MD;  Location: [place] CV LAB;  Service: Cardiovascular;;   INTRAVASCULAR ULTRASOUND/IVUS N/A 09/02/2020   Procedure: Intravascular Ultrasound/IVUS;  Surgeon: Martinique, Peter M, MD;  Location: [place] CV LAB;  Service: Cardiovascular;  Laterality: N/A;   LEFT HEART CATH AND CORS/GRAFTS  ANGIOGRAPHY N/A 08/31/2020   Procedure: LEFT HEART CATH AND CORS/GRAFTS ANGIOGRAPHY;  Surgeon: Belva Crome, MD;  Location: [place] CV LAB;  Service: Cardiovascular;  Laterality: N/A;   LEFT HEART CATHETERIZATION WITH CORONARY ANGIOGRAM N/A 01/13/2011   Procedure: LEFT HEART CATHETERIZATION WITH CORONARY ANGIOGRAM;  Surgeon: Troy Sine, MD;  Location: [facility] CATH LAB;  Service: Cardiovascular;  Laterality: N/A;   PUBOVAGINAL SLING  2002   ROBOT ASSISTED LAPAROSCOPIC NEPHRECTOMY Right 02/27/2018   Procedure: XI ROBOTIC ASSISTE PARTIAL NEPHRECTOMY;  Surgeon: Ceasar Mons, MD;  Location: [facility];  Service: Urology;  Laterality: Right;   STERNAL WOUND DEBRIDEMENT  07/27/2011   Procedure: STERNAL WOUND DEBRIDEMENT;  followed by wound vac Surgeon: Ivin Poot, MD;  Location: [place];  Service: Open Heart Surgery;  Laterality: N/A;   TONSILLECTOMY  1952   TOTAL HIP ARTHROPLASTY Right 08/22/2016   Procedure: TOTAL HIP ARTHROPLASTY ANTERIOR APPROACH;  Surgeon: Melrose Nakayama, MD;  Location: [place];  Service: Orthopedics;  Laterality: Right;   TOTAL KNEE ARTHROPLASTY Right 07/22/2019   Procedure: RIGHT TOTAL KNEE ARTHROPLASTY;  Surgeon: Melrose Nakayama, MD;  Location: [facility];  Service: Orthopedics;  Laterality: Right;   TRIGGER FINGER RELEASE Right 2014   4th finger   TROCHANTERIC BURSA EXCISION Right    bursa removed, R hip   TUBAL LIGATION      Current Medications: Current Meds  Medication Sig   albuterol (PROVENTIL HFA) 108 (90 Base) MCG/ACT inhaler INHALE 2 PUFF AS DIRECTED FOUR TIMES A DAY AS NEEDED (Patient taking differently: Inhale 1 puff into the lungs every 6 (six) hours as needed for wheezing or shortness of breath.)   amLODipine (NORVASC) 5 MG tablet Take 1 tablet (5 mg total) by mouth daily.   aspirin EC 81 MG tablet Take 81 mg by mouth daily. Swallow whole.   atorvastatin (LIPITOR) 20 MG tablet Take 1 tablet (20 mg total) by mouth daily.   Calcium Carb-Cholecalciferol  (CALTRATE 600+D3 PO) Take 1 tablet by mouth daily.   carisoprodol (SOMA) 350 MG tablet Take 350 mg by mouth every 8 (eight) hours as needed (for arthritis pain).    Evolocumab (REPATHA SURECLICK) 193 MG/ML SOAJ Inject 140 mg into the skin every 14 (fourteen) days.   Fluticasone-Salmeterol (ADVAIR DISKUS) 500-50 MCG/DOSE AEPB Inhale 1 puff into the lungs 2 (two) times daily.   gabapentin (NEURONTIN) 600 MG tablet Take 1,200-1,800 mg by mouth See admin instructions. Take 2 tablets (1200 mg) by mouth in the morning & take 3 tablets (1800 mg) by mouth at night   HYDROmorphone (DILAUDID) 4 MG tablet Take 1 tablet (4 mg total) by mouth every 4 (four) hours as needed for severe pain.   magnesium oxide (MAG-OX) 400 MG tablet Take 400 mg by mouth at bedtime.   metoprolol tartrate (LOPRESSOR) 25 MG tablet Take 1 tablet (25 mg total) by mouth 2 (two) times daily.   montelukast (SINGULAIR) 10 MG tablet TAKE ONE TABLET BY MOUTH DAILY (Patient taking differently: Take 10 mg by mouth daily.)   Multiple Vitamins-Minerals (PRESERVISION AREDS 2 PO) Take 1 tablet by mouth in the morning and at bedtime.   nitroGLYCERIN (NITROSTAT) 0.4 MG SL tablet Place 1 tablet (0.4 mg total) under the tongue every 5 (five) minutes x 3 doses as needed for chest pain.   silodosin (RAPAFLO) 8 MG CAPS capsule Take 8 mg by mouth daily.    [DISCONTINUED] clopidogrel (PLAVIX) 75 MG tablet Take 1 tablet (75 mg total) by mouth daily with breakfast.     Allergies:   Dimenhydrinate, Morphine and related, Percocet [oxycodone-acetaminophen], and Zolpidem   Social History   Socioeconomic History   Marital status: Married    Spouse name: Not on file   Number of children: Not on file   Years of education: Not on file   Highest education level: Not on file  Occupational History   Not on file  Tobacco Use   Smoking status: Former    Types: Cigarettes    Quit date: 01/12/1980    Years since quitting: 40.8   Smokeless tobacco: Never   Vaping Use   Vaping Use: Never used  Substance and Sexual Activity   Alcohol use: No    Alcohol/week: 0.0 standard drinks   Drug use: No   Sexual activity:  Yes  Other Topics Concern   Not on file  Social History Narrative   Not on file   Social Determinants of Health   Financial Resource Strain: Not on file  Food Insecurity: Not on file  Transportation Needs: Not on file  Physical Activity: Not on file  Stress: Not on file  Social Connections: Not on file     Family History: The patient's family history includes Hypertension in her brother. She was adopted.  ROS:   Please see the history of present illness.     All other systems reviewed and are negative.  EKGs/Labs/Other Studies Reviewed:    The following studies were reviewed today:  Stent and intravascular lithotripsy 09/02/20:   Prox RCA lesion is 80% stenosed.   A drug-eluting stent was successfully placed using a SYNERGY XD 3.0X24.   Post intervention, there is a 0% residual stenosis.   Successful PCI of the proximal RCA with IVUS guidance, orbital atherectomy, Shockwave lithotripsy and DES x 1   Plan: DAPT for one year. If stable anticipate DC in am. _____________   Left heart cath 08/31/20:   Total occlusion of SVG to RCA-PL.   Patent LIMA to LAD.   Patent SVG to OM.   85% heavily calcified distal left main.   85 to 90% proximal LAD before the first diagonal, followed by 100% occlusion in the mid vessel.   60 to 70% ostial to proximal circumflex.  Competitive flow was noted in the circumflex due to the widely patent saphenous vein graft.   Right coronary is dominant, heavily calcified, and tortuous.  In the proximal vessel within a right angle bend there is 50 to 70% obstruction with eccentric calcification.  The mid RCA contains focal eccentric 60 to 70% stenosis.   LV function is normal.  EF is 60%.  LVEDP is 12 mmHg.   Plan: Medical therapy If recurring symptoms compatible with angina, consider therapy  of the right coronary proximal and mid segment.  This would require plaque modification to get full stent expansion.  Would likely be difficult to perform due to tortuosity.  May be more appropriately managed from a femoral approach.  Discussed with Dr. Ellyn Hack.   _____________   Echo 8/31/122: 1. Left ventricular ejection fraction, by estimation, is 55 to 60%. The  left ventricle has normal function. The left ventricle has no regional  wall motion abnormalities. There is mild concentric left ventricular  hypertrophy. Left ventricular diastolic  parameters are indeterminate.   2. Right ventricular systolic function is low normal. The right  ventricular size is normal.   3. The mitral valve is grossly normal. Trivial mitral valve  regurgitation. No evidence of mitral stenosis.   4. The aortic valve has an indeterminant number of cusps. Aortic valve  regurgitation is not visualized. No aortic stenosis is present.   5. The inferior vena cava is normal in size with greater than 50%  respiratory variability, suggesting right atrial pressure of 3 mmHg.   EKG:  EKG is not ordered today.   Recent Labs: 08/31/2020: ALT 17 09/20/2020: BUN 21; Creatinine, Ser 0.93; Hemoglobin 12.9; Platelets 217; Potassium 5.0; Sodium 143  Recent Lipid Panel    Component Value Date/Time   CHOL 143 09/01/2020 0241   CHOL 170 05/03/2020 0910   TRIG 57 09/01/2020 0241   HDL 70 09/01/2020 0241   HDL 82 05/03/2020 0910   CHOLHDL 2.0 09/01/2020 0241   VLDL 11 09/01/2020 0241   LDLCALC 62 09/01/2020 0241  M[place]ws 75 05/03/2020 0910    Physical Exam:    VS:  BP 118/68   Pulse 80   Ht 5' 4.5" (1.638 m)   Wt 184 lb 12.8 oz (83.8 kg)   SpO2 96%   BMI 31.23 kg/m     Wt Readings from Last 3 Encounters:  11/18/20 184 lb 12.8 oz (83.8 kg)  11/08/20 184 lb 12.8 oz (83.8 kg)  10/04/20 181 lb 3.2 oz (82.2 kg)     GEN: Well nourished, well developed in no acute distress HEENT: Normal NECK: No JVD; No carotid  bruits LYMPHATICS: No lymphadenopathy CARDIAC: RRR, no murmurs, rubs, gallops RESPIRATORY:  Clear to auscultation without rales, wheezing or rhonchi  ABDOMEN: Soft, non-tender, non-distended MUSCULOSKELETAL:  No edema; No deformity  SKIN: Warm and dry NEUROLOGIC:  Alert and oriented x 3 PSYCHIATRIC:  Normal affect   ASSESSMENT:    1. S/P CABG x 3: (lima-lad,svg-om,svg-rca)   2. Coronary artery disease involving coronary bypass graft of native heart with angina pectoris ([place])   3. Mixed hyperlipidemia   4. Essential hypertension   5. Renal cell carcinoma, unspecified laterality ([place])   6. Preoperative clearance    PLAN:    In order of problems listed above:  CAD s/p CABG times 03/2011 - Orbital atherectomy and DES to RCA 09/02/20 - Continue aspirin, Plavix, beta-blocker, statin - may need 2.5 mg amlodipine - no chest pain, but continued DOE   Hyperlipidemia with LDL goal less than 70 09/01/2020: Cholesterol 143; HDL 70; LDL Cholesterol 62; Triglycerides 57; VLDL 11 - continue 20 mg lipitor - intolerant to higher doses - continue repatha - will obtain fasting lipid profile   Hypertension - 25 mg metoprolol tartrate twice daily   History of renal cell carcinoma - History of partial nephrectomy - She has a concerning lesion on her kidney that needs to be biopsied - She is unable to hold Plavix for at least 6 months   Upcoming surgeries If she needs neck injections, knee surgery, or treatment of her possible renal cell carcinoma-she will need to hold Plavix.  I would like for her to discuss this with Dr. Gwenlyn Found in late February.  She will be approximately 6 months out from her PCI.  From a preoperative clearance standpoint, she is very active and is able to complete more than 4.0 METS.  Given her disease, she is going to be at least moderate risk for any kind of surgery.  She has done well with many (22) surgeries in the past.  I do not anticipate any further work-up prior to  her upcoming surgeries unless she has new or worsening symptoms.    Medication Adjustments/Labs and Tests Ordered: Current medicines are reviewed at length with the patient today.  Concerns regarding medicines are outlined above.  No orders of the defined types were placed in this encounter.  Meds ordered this encounter  Medications   clopidogrel (PLAVIX) 75 MG tablet    Sig: Take 1 tablet (75 mg total) by mouth daily with breakfast.    Dispense:  90 tablet    Refill:  3    Signed, Ledora Bottcher, [place]  11/18/2020 9:43 AM    [facility]

## 2020-11-11 NOTE — Telephone Encounter (Signed)
LMOM

## 2020-11-11 NOTE — Telephone Encounter (Signed)
Patient called back.  Reports 5 days after her first injection of Repatha she developed a severe headache that has not stopped.  Takes hydromorphone and Soma and has not helped.  Called neurologist but can not be seen until February.  Discussed possible adverse effects of Repatha and found that headache was seen in 4% of patients (no mention of severity or duration).  Advised to skip tonights injection to see if headache disappears.  Patient will reach back out in 1 week.

## 2020-11-11 NOTE — Telephone Encounter (Signed)
The pt called and left a msg and stated that they would like to speak to rph pavero in regards to cholesterol injections. So I will route to him.

## 2020-11-17 ENCOUNTER — Telehealth: Payer: Self-pay | Admitting: Pharmacist

## 2020-11-17 NOTE — Telephone Encounter (Signed)
Patient called back.  Still has a headache but not as severe as before.  Is willing to retry Repatha injections.  Will call back if headache returns.

## 2020-11-18 ENCOUNTER — Encounter: Payer: Self-pay | Admitting: Physician Assistant

## 2020-11-18 ENCOUNTER — Other Ambulatory Visit: Payer: Self-pay

## 2020-11-18 ENCOUNTER — Ambulatory Visit: Payer: PPO | Admitting: Physician Assistant

## 2020-11-18 VITALS — BP 118/68 | HR 80 | Ht 64.5 in | Wt 184.8 lb

## 2020-11-18 DIAGNOSIS — I10 Essential (primary) hypertension: Secondary | ICD-10-CM

## 2020-11-18 DIAGNOSIS — C649 Malignant neoplasm of unspecified kidney, except renal pelvis: Secondary | ICD-10-CM

## 2020-11-18 DIAGNOSIS — Z01810 Encounter for preprocedural cardiovascular examination: Secondary | ICD-10-CM | POA: Diagnosis not present

## 2020-11-18 DIAGNOSIS — I25709 Atherosclerosis of coronary artery bypass graft(s), unspecified, with unspecified angina pectoris: Secondary | ICD-10-CM

## 2020-11-18 DIAGNOSIS — E782 Mixed hyperlipidemia: Secondary | ICD-10-CM

## 2020-11-18 DIAGNOSIS — Z951 Presence of aortocoronary bypass graft: Secondary | ICD-10-CM | POA: Diagnosis not present

## 2020-11-18 DIAGNOSIS — Z01818 Encounter for other preprocedural examination: Secondary | ICD-10-CM

## 2020-11-18 MED ORDER — CLOPIDOGREL BISULFATE 75 MG PO TABS: 75.0000 mg | ORAL_TABLET | Freq: Every day | ORAL | 3 refills | Status: DC

## 2020-11-18 NOTE — Patient Instructions (Signed)
Medication Instructions:  No Changes *If you need a refill on your cardiac medications before your next appointment, please call your pharmacy*   Lab Work: No Labs If you have labs (blood work) drawn today and your tests are completely normal, you will receive your results only by: [place] (if you have MyChart) OR A paper copy in the mail If you have any lab test that is abnormal or we need to change your treatment, we will call you to review the results.   Testing/Procedures: No Testing   Follow-Up: At [facility], you and your health needs are our priority.  As part of our continuing mission to provide you with exceptional heart care, we have created designated Provider Care Teams.  These Care Teams include your primary Cardiologist (physician) and Advanced Practice Providers (APPs -  Physician Assistants and Nurse Practitioners) who all work together to provide you with the care you need, when you need it.  We recommend signing up for the patient portal called "MyChart".  Sign up information is provided on this After Visit Summary.  MyChart is used to connect with patients for Virtual Visits (Telemedicine).  Patients are able to view lab/test results, encounter notes, upcoming appointments, etc.  Non-urgent messages can be sent to your provider as well.   To learn more about what you can do with MyChart, go to [email or web address].    Your next appointment:   3 month(s)  The format for your next appointment:   In Person  Provider:   Quay Burow, MD

## 2020-11-30 ENCOUNTER — Telehealth (HOSPITAL_COMMUNITY): Payer: Self-pay

## 2020-11-30 NOTE — Telephone Encounter (Signed)
Called and spoke to pt in regards to CR, pt stated she is not interested at this time.   Closed referral

## 2020-12-02 ENCOUNTER — Encounter (INDEPENDENT_AMBULATORY_CARE_PROVIDER_SITE_OTHER): Payer: PPO | Admitting: Ophthalmology

## 2020-12-02 ENCOUNTER — Other Ambulatory Visit: Payer: Self-pay

## 2020-12-02 DIAGNOSIS — H43813 Vitreous degeneration, bilateral: Secondary | ICD-10-CM

## 2020-12-02 DIAGNOSIS — M069 Rheumatoid arthritis, unspecified: Secondary | ICD-10-CM | POA: Diagnosis not present

## 2020-12-02 DIAGNOSIS — I10 Essential (primary) hypertension: Secondary | ICD-10-CM

## 2020-12-02 DIAGNOSIS — H353121 Nonexudative age-related macular degeneration, left eye, early dry stage: Secondary | ICD-10-CM

## 2020-12-02 DIAGNOSIS — Z79899 Other long term (current) drug therapy: Secondary | ICD-10-CM | POA: Diagnosis not present

## 2020-12-02 DIAGNOSIS — H35033 Hypertensive retinopathy, bilateral: Secondary | ICD-10-CM

## 2020-12-07 NOTE — Progress Notes (Signed)
NEUROLOGY CONSULTATION NOTE  [name] Long MRN: [id number] DOB: 10-21-1940  Referring provider: Jill Alexanders, MD Primary care provider: Jill Alexanders, MD  Reason for consult:  bilateral occipital neuralgia  Assessment/Plan:   Cervicogenic headache/cervicogenic migraine  Cervical spondylosis with history of cervical stenosis s/p three fusions  The source of her headache is the neck pain, which is the primary issue for the patient.  There really isn't anything more that I can offer in regards to treating the headache and neck pain.  I don't believe that this is occipital neuralgia and therefore do not feel occipital nerve blocks would be beneficial.  I would recommend to continue working with her pain specialist to optimize pain management.   Subjective:  [name] Long is a 80 year old female with asthma, arthritis, CAD, connective tissue disease, chronic pain syndrome who presents for bilateral occipital neuralgia.  History supplemented by referring provider's note.  Bilateral back into shoulder and down mid back.  Severe up to head 2-3 days dizziness, nausea, photo once a month  Press back into chair    She has chronic neck pain with history of cervical spondylosis and stenosis status post anterior fusion at C4-5, C5-6 and C6-7.  Supposed to have fourth fusion several years ago but was put on hold after she had an MI.  Pain has gradually gotten worse over the years.  She describes severe pain in the bilateral posterior neck radiating down into the shoulders.  If severe, she may have a shooting pain down her spine to the mid-back.  Sometimes, the neck pain causes a pressure headache from the back of the head wrapping around to the front.  There is associated nausea, dizziness and photophobia.  It lasts 2 to 3 days and occurs once a month.    She is followed by a pain specialist.  Currently treats pain with:  gabapentin 1200mg  in AM and 1800mg  in PM, Soma, Dilaudid (infrequent), heat, ice,  Tylenol 500mg  (2 a week) Past treatment include:  amitriptyline, tramadol, Fentanyl, tizanidine, ESI   07/05/2010 MRI C-SPINE (personally reviewed): 1.  Status post anterior fusion at C4-5, C5-6, and C6-7.  2.  Mild distortion of the cord at C4-5 and C5-6 due to osseous  ridging at the fused levels.  3.  Moderate left and mild right foraminal narrowing at C3-4 due to  uncovertebral disease and asymmetric facet hypertrophy.  4.  Mild to moderate central canal stenosis at C3-4.  5.  Mild right foraminal stenosis at C5-6.  6.  Mild foraminal stenosis bilaterally at C7-T1 is worse on the  right, largely due to uncovertebral disease.  A broad-based disc  bulge is also present.   PAST MEDICAL HISTORY: Past Medical History:  Diagnosis Date   Abnormal EKG 01/12/2011   Allergy    RHINITIS   Arthritis    neck, back knees, hips   Asthma    Asthma 01/12/2011   Back pain, chronic--plans were for cervical disc surgery week of 01/16/11 01/12/2011   Blood clot in vein 2004   L leg   Blood transfusion without reported diagnosis 2004   Chronic low back pain    Complication of anesthesia    post op bladder retention after surgery   Connective tissue disease ([place])    Coronary artery disease    Crescendo angina, with EKG changes, new. 01/12/2011   Dyspnea    Fibromyalgia    Fibromyalgia    GERD (gastroesophageal reflux disease)    Headache  migraine - several in a yr.    History of stress test 04/2011   Normal stress test   Hx of echocardiogram 07/19/2011   EF 50-55%. There is mild annular calcification, there is trace migtral regurgitation, there is trace tricupid regurgitation, pericardium is thick, there is no pericardial effusion.   Hypertension    Myocardial infarction [facility]) 2013   triple heart bypass   Neuromuscular disorder (HCC)    fibromyalgia    Osteoporosis    OSTEOPENIA   Personal history of colonic polyp - adenoma 08/05/2013   08/2013 - 7 mm adenoma - consider repeat colonoscopy  2020 (age 42)   Pneumonia 2018   numerous times, only admitted one time for pneumonia but she has had pneumonia numerous times;   02-19-2018 END OF NOVEMBER 2019  WAS ADMITTED FOR ABOUT  A WEEK DUE TO "DOUBLE PNEUMONIA", REPORTS TODAY FEELING BACK TO HER NORMAL , DENIES SOB, O2 SAT AT 35 ROOM AIR , AFEBRILE, OTHER VSS    PONV (postoperative nausea and vomiting)    PPD positive    Status post coronary artery bypass grafting    Urinary incontinence    post op, "sleepy bladder", urinar retention     PAST SURGICAL HISTORY: Past Surgical History:  Procedure Laterality Date   [place]   BIOPSY BREAST Left 03/27/2019   Benign fibroaipose tissue no evidence of malignancy   BLADDER REPAIR  1985   BREAST SURGERY  1992   REDUCTION, x3 surgeries overall on breast for augmentation    CARDIAC CATHETERIZATION  2013   CARPAL TUNNEL RELEASE Bilateral 1988, [place]   CORONARY ARTERY BYPASS GRAFT  01/14/2011   Procedure: CORONARY ARTERY BYPASS GRAFTING (CABG);  Surgeon: Tharon Aquas Adelene Idler, MD;  Location: [place];  Service: Open Heart Surgery;  Laterality: N/A;  CABG x three, using right greater saphenous vein harvested endoscopically   CORONARY ATHERECTOMY N/A 09/02/2020   Procedure: CORONARY ATHERECTOMY;  Surgeon: Martinique, Peter M, MD;  Location: [place] CV [facility];  Service: Cardiovascular;  Laterality: N/A;   CORONARY STENT INTERVENTION N/A 09/02/2020   Procedure: CORONARY STENT INTERVENTION;  Surgeon: Martinique, Peter M, MD;  Location: [place] CV [facility];  Service: Cardiovascular;  Laterality: N/A;   EYE SURGERY Bilateral 1985   radiokerototomy   INTRAVASCULAR LITHOTRIPSY  09/02/2020   Procedure: INTRAVASCULAR LITHOTRIPSY;  Surgeon: Martinique, Peter M, MD;  Location: [place] [facility];  Service: Cardiovascular;;   INTRAVASCULAR ULTRASOUND/IVUS N/A 09/02/2020   Procedure: Intravascular Ultrasound/IVUS;  Surgeon: Martinique, Peter M, MD;   Location: [place] [facility];  Service: Cardiovascular;  Laterality: N/A;   LEFT HEART CATH AND CORS/GRAFTS ANGIOGRAPHY N/A 08/31/2020   Procedure: LEFT HEART CATH AND CORS/GRAFTS ANGIOGRAPHY;  Surgeon: Belva Crome, MD;  Location: [place] [facility];  Service: Cardiovascular;  Laterality: N/A;   LEFT HEART CATHETERIZATION WITH CORONARY ANGIOGRAM N/A 01/13/2011   Procedure: LEFT HEART CATHETERIZATION WITH CORONARY ANGIOGRAM;  Surgeon: Troy Sine, MD;  Location[facility];  Service: Cardiovascular;  Laterality: N/A;   PUBOVAGINAL SLING  2002   ROBOT ASSISTED LAPAROSCOPIC NEPHRECTOMY Right 02/27/2018   Procedure: XI ROBOTIC ASSISTE PARTIAL NEPHRECTOMY;  Surgeon: Ceasar Mons, MD;  Location: [facility];  Service: Urology;  Laterality: Right;   STERNAL WOUND DEBRIDEMENT  07/27/2011   Procedure: STERNAL WOUND DEBRIDEMENT;  followed by wound vac Surgeon: Ivin Poot, MD;  Location: [place];  Service: Open Heart Surgery;  Laterality: N/A;   TONSILLECTOMY  1952   TOTAL HIP ARTHROPLASTY Right 08/22/2016   Procedure: TOTAL HIP ARTHROPLASTY ANTERIOR APPROACH;  Surgeon: Melrose Nakayama, MD;  Location: [place];  Service: Orthopedics;  Laterality: Right;   TOTAL KNEE ARTHROPLASTY Right 07/22/2019   Procedure: RIGHT TOTAL KNEE ARTHROPLASTY;  Surgeon: Melrose Nakayama, MD;  Location: [facility];  Service: Orthopedics;  Laterality: Right;   TRIGGER FINGER RELEASE Right 2014   4th finger   TROCHANTERIC BURSA EXCISION Right    bursa removed, R hip   TUBAL LIGATION      MEDICATIONS: Current Outpatient Medications on File Prior to Visit  Medication Sig Dispense Refill   albuterol (PROVENTIL HFA) 108 (90 Base) MCG/ACT inhaler INHALE 2 PUFF AS DIRECTED FOUR TIMES A DAY AS NEEDED (Patient taking differently: Inhale 1 puff into the lungs every 6 (six) hours as needed for wheezing or shortness of breath.) 6.7 g 1   amLODipine (NORVASC) 5 MG tablet Take 1 tablet (5 mg total) by mouth daily. 90 tablet 3    aspirin EC 81 MG tablet Take 81 mg by mouth daily. Swallow whole.     atorvastatin (LIPITOR) 20 MG tablet Take 1 tablet (20 mg total) by mouth daily. 90 tablet 3   Calcium Carb-Cholecalciferol (CALTRATE 600+D3 PO) Take 1 tablet by mouth daily.     carisoprodol (SOMA) 350 MG tablet Take 350 mg by mouth every 8 (eight) hours as needed (for arthritis pain).      clopidogrel (PLAVIX) 75 MG tablet Take 1 tablet (75 mg total) by mouth daily with breakfast. 90 tablet 3   Evolocumab (REPATHA SURECLICK) 619 MG/ML SOAJ Inject 140 mg into the skin every 14 (fourteen) days. 2 mL 11   Fluticasone-Salmeterol (ADVAIR DISKUS) 500-50 MCG/DOSE AEPB Inhale 1 puff into the lungs 2 (two) times daily. 3 each 3   gabapentin (NEURONTIN) 600 MG tablet Take 1,200-1,800 mg by mouth See admin instructions. Take 2 tablets (1200 mg) by mouth in the morning & take 3 tablets (1800 mg) by mouth at night     HYDROmorphone (DILAUDID) 4 MG tablet Take 1 tablet (4 mg total) by mouth every 4 (four) hours as needed for severe pain. 10 tablet 0   magnesium oxide (MAG-OX) 400 MG tablet Take 400 mg by mouth at bedtime.     metoprolol tartrate (LOPRESSOR) 25 MG tablet Take 1 tablet (25 mg total) by mouth 2 (two) times daily. 180 tablet 3   montelukast (SINGULAIR) 10 MG tablet TAKE ONE TABLET BY MOUTH DAILY (Patient taking differently: Take 10 mg by mouth daily.) 90 tablet 1   Multiple Vitamins-Minerals (PRESERVISION AREDS 2 PO) Take 1 tablet by mouth in the morning and at bedtime.     nitroGLYCERIN (NITROSTAT) 0.4 MG SL tablet Place 1 tablet (0.4 mg total) under the tongue every 5 (five) minutes x 3 doses as needed for chest pain. 25 tablet 1   silodosin (RAPAFLO) 8 MG CAPS capsule Take 8 mg by mouth daily.      No current facility-administered medications on file prior to visit.    ALLERGIES: Allergies  Allergen Reactions   Dimenhydrinate Other (See Comments)    "locks up kidneys"   Morphine And Related Nausea And Vomiting    Percocet [Oxycodone-Acetaminophen] Other (See Comments)    hallucinations   Zolpidem Other (See Comments)    Unusual behavior    FAMILY HISTORY: Family History  Adopted: Yes  Problem Relation Age of Onset  Hypertension Brother     Objective:  Blood pressure 133/81, pulse (!) 101, height 5\' 4"  (1.626 m), weight 182 lb 6.4 oz (82.7 kg), SpO2 95 %. General: No acute distress.  Patient appears well-groomed.   Head:  Normocephalic/atraumatic Eyes:  fundi examined but not visualized Neck: supple, bilateral paraspinal tenderness, reduced range of motion Heart: regular rate and rhythm Lungs: Clear to auscultation bilaterally. Vascular: No carotid bruits. Neurological Exam: Mental status: alert and oriented to person, place, and time, recent and remote memory intact, fund of knowledge intact, attention and concentration intact, speech fluent and not dysarthric, language intact. Cranial nerves: CN I: not tested CN II: pupils equal, round and reactive to light, visual fields intact CN III, IV, VI:  full range of motion, no nystagmus, no ptosis CN V: facial sensation intact. CN VII: upper and lower face symmetric CN VIII: hearing intact CN IX, X: gag intact, uvula midline CN XI: sternocleidomastoid and trapezius muscles intact CN XII: tongue midline Bulk & Tone: normal, no fasciculations. Motor:  muscle strength 5/5 throughout Sensation:  Pinprick, temperature and vibratory sensation intact. Deep Tendon Reflexes:  2+ throughout,  toes downgoing.   Finger to nose testing:  Without dysmetria.   Heel to shin:  Without dysmetria.   Gait:  Normal station and stride.  Romberg negative.    Thank you for allowing me to take part in the care of this patient.  Metta Clines, DO  CC: Jill Alexanders, MD

## 2020-12-08 ENCOUNTER — Other Ambulatory Visit: Payer: Self-pay

## 2020-12-08 ENCOUNTER — Encounter: Payer: Self-pay | Admitting: Neurology

## 2020-12-08 ENCOUNTER — Ambulatory Visit: Payer: PPO | Admitting: Neurology

## 2020-12-08 VITALS — BP 133/81 | HR 101 | Ht 64.0 in | Wt 182.4 lb

## 2020-12-08 DIAGNOSIS — G4486 Cervicogenic headache: Secondary | ICD-10-CM

## 2020-12-08 DIAGNOSIS — M47812 Spondylosis without myelopathy or radiculopathy, cervical region: Secondary | ICD-10-CM

## 2020-12-13 DIAGNOSIS — D0471 Carcinoma in situ of skin of right lower limb, including hip: Secondary | ICD-10-CM | POA: Diagnosis not present

## 2020-12-13 DIAGNOSIS — L989 Disorder of the skin and subcutaneous tissue, unspecified: Secondary | ICD-10-CM | POA: Diagnosis not present

## 2020-12-16 ENCOUNTER — Encounter: Payer: Self-pay | Admitting: Family Medicine

## 2020-12-16 ENCOUNTER — Other Ambulatory Visit: Payer: Self-pay

## 2020-12-16 ENCOUNTER — Ambulatory Visit (INDEPENDENT_AMBULATORY_CARE_PROVIDER_SITE_OTHER): Payer: PPO | Admitting: Family Medicine

## 2020-12-16 VITALS — BP 124/76 | HR 74 | Temp 97.8°F | Ht 64.0 in | Wt 182.2 lb

## 2020-12-16 DIAGNOSIS — Z951 Presence of aortocoronary bypass graft: Secondary | ICD-10-CM

## 2020-12-16 DIAGNOSIS — I10 Essential (primary) hypertension: Secondary | ICD-10-CM | POA: Diagnosis not present

## 2020-12-16 DIAGNOSIS — J4530 Mild persistent asthma, uncomplicated: Secondary | ICD-10-CM | POA: Diagnosis not present

## 2020-12-16 DIAGNOSIS — E559 Vitamin D deficiency, unspecified: Secondary | ICD-10-CM

## 2020-12-16 DIAGNOSIS — M8580 Other specified disorders of bone density and structure, unspecified site: Secondary | ICD-10-CM

## 2020-12-16 DIAGNOSIS — I2510 Atherosclerotic heart disease of native coronary artery without angina pectoris: Secondary | ICD-10-CM | POA: Diagnosis not present

## 2020-12-16 DIAGNOSIS — M549 Dorsalgia, unspecified: Secondary | ICD-10-CM | POA: Diagnosis not present

## 2020-12-16 DIAGNOSIS — I25709 Atherosclerosis of coronary artery bypass graft(s), unspecified, with unspecified angina pectoris: Secondary | ICD-10-CM

## 2020-12-16 DIAGNOSIS — Z860100 Personal history of colon polyps, unspecified: Secondary | ICD-10-CM

## 2020-12-16 DIAGNOSIS — Z0000 Encounter for general adult medical examination without abnormal findings: Secondary | ICD-10-CM

## 2020-12-16 DIAGNOSIS — E785 Hyperlipidemia, unspecified: Secondary | ICD-10-CM | POA: Diagnosis not present

## 2020-12-16 DIAGNOSIS — Z955 Presence of coronary angioplasty implant and graft: Secondary | ICD-10-CM | POA: Diagnosis not present

## 2020-12-16 DIAGNOSIS — G8929 Other chronic pain: Secondary | ICD-10-CM

## 2020-12-16 DIAGNOSIS — M5416 Radiculopathy, lumbar region: Secondary | ICD-10-CM | POA: Diagnosis not present

## 2020-12-16 DIAGNOSIS — R339 Retention of urine, unspecified: Secondary | ICD-10-CM | POA: Diagnosis not present

## 2020-12-16 DIAGNOSIS — Z85528 Personal history of other malignant neoplasm of kidney: Secondary | ICD-10-CM

## 2020-12-16 DIAGNOSIS — I700 Atherosclerosis of aorta: Secondary | ICD-10-CM

## 2020-12-16 MED ORDER — ALBUTEROL SULFATE HFA 108 (90 BASE) MCG/ACT IN AERS: INHALATION_SPRAY | RESPIRATORY_TRACT | 1 refills | Status: DC

## 2020-12-16 MED ORDER — FLUTICASONE-SALMETEROL 500-50 MCG/ACT IN AEPB: 1.0000 | INHALATION_SPRAY | Freq: Two times a day (BID) | RESPIRATORY_TRACT | 3 refills | Status: DC

## 2020-12-16 NOTE — Progress Notes (Signed)
[name] Long is a 80 y.o. female who presents for annual wellness visit and follow-up on chronic medical conditions.  She has had a recent stent placed.  She does have underlying CABG from several years ago.  She continues on Repatha as well as Plavix and metoprolol.  She is followed by cardiology regularly.  Also has a history of aortic atherosclerosis.  She has had difficulty with urinary retention and does use Rapaflo.  She does self catheterize her self on occasion.  She also has remote history of renal cell cancer and a recent CT scan showing a questionable lesion that urology is following.  She continues have low back as well as cervical spine pain.  She is taking Dilaudid as well as gabapentin and followed regularly by pain management.  Her asthma seems to be under good control.  She does have a history of colonic polyps however has seen GI and no further intervention necessary due to her age.  She also has a history of vitamin D deficiency and has been on calcium and vitamin D and does have a history of osteopenia.  Immunizations and Health Maintenance Immunization History  Administered Date(s) Administered   Fluad Quad(high Dose 65+) 08/22/2018, 10/13/2019   Influenza, High Dose Seasonal PF 10/18/2013, 12/01/2016, 10/01/2017   Influenza,inj,Quad PF,6+ Mos 09/23/2012   Influenza-Unspecified 12/04/2012, 10/07/2013, 11/02/2013, 09/17/2014, 10/17/2014, 10/22/2015, 12/02/2016, 09/29/2020   PFIZER(Purple Top)SARS-COV-2 Vaccination 02/06/2019, 03/03/2019, 10/13/2019   Pfizer Covid-19 Vaccine Bivalent Booster 36yrs & up 11/08/2020   Pneumococcal Conjugate-13 04/08/2015, 02/17/2016   Pneumococcal Polysaccharide-23 11/19/2002, 09/23/2012, 12/04/2012   Tdap 07/19/2009, 01/13/2020   Zoster Recombinat (Shingrix) 08/09/2016, 11/27/2016   Zoster, Live 04/16/2008, 03/30/2010   There are no preventive care reminders to display for this patient.  Last Pap smear:aged out  Last mammogram: 03/15/20  Last  colonoscopy: 08/05/2013 aged out Dr. Carlean Purl Last DEXA: 09/30/19 Dentist: Q six months Ophtho: Q year Exercise: walking QD   Other doctors caring for patient include: Dr. Robyne Askew , PA Dr. Gwenlyn Found Cardiology            Dr. Rhona Raider orthopedic             Dr. Hardin Negus pain mang.  Advanced directives: Does Patient Have a Medical Advance Directive?: Yes Type of Advance Directive: [place] in Chart?: Yes - validated most recent copy scanned in chart (See row information)  Depression screen:  See questionnaire below.  Depression screen [facility] 2/9 12/16/2020 12/11/2019 12/05/2018 10/01/2017 07/31/2016  Decreased Interest 0 0 0 0 0  Down, Depressed, Hopeless 0 0 0 0 0  PHQ - 2 Score 0 0 0 0 0  Some recent data might be hidden    Fall Risk Screen: see questionnaire below. Fall Risk  12/16/2020 12/08/2020 12/11/2019 07/29/2019 12/05/2018  Falls in the past year? 0 0 1 0 1  Comment - - - [place] Telephone Survey: data to providers prior to load -  Number falls in past yr: 0 0 0 - 0  Injury with Fall? 0 0 1 - 1  Risk for fall due to : No Fall Risks - Impaired balance/gait - -    ADL screen:  See questionnaire below Functional Status Survey: Is the patient deaf or have difficulty hearing?: No Does the patient have difficulty seeing, even when wearing glasses/contacts?: No Does the patient have difficulty concentrating, remembering, or making decisions?: No  Does the patient have difficulty walking or climbing stairs?: Yes Does the patient have difficulty dressing or bathing?: No Does the patient have difficulty doing errands alone such as visiting a doctor's office or shopping?: No   Review of Systems Constitutional: -, -unexpected weight change, -anorexia, -fatigue Allergy: -sneezing, -itching, -congestion Dermatology: denies changing moles, rash, lumps ENT: -runny nose, -ear pain, -sore throat,  Cardiology:  -chest pain,  -palpitations, -orthopnea, Respiratory: -cough, -shortness of breath, -dyspnea on exertion, -wheezing,  Gastroenterology: -abdominal pain, -nausea, -vomiting, -diarrhea, -constipation, -dysphagia Hematology: -bleeding or bruising problems Musculoskeletal: -arthralgias, -myalgias, -joint swelling, -back pain, - Ophthalmology: -vision changes,  Urology: -dysuria, -difficulty urinating,  -urinary frequency, -urgency, incontinence Neurology: -, -numbness, , -memory loss, -falls, -dizziness  PHYSICAL EXAM: General Appearance: Alert, cooperative, no distress, appears stated age Head: Normocephalic, without obvious abnormality, atraumatic Eyes: PERRL, conjunctiva/corneas clear, EOM's intact. Ears: Normal TM's and external ear canals Nose: Nares normal, mucosa normal, no drainage or sinus tenderness Throat: Lips, mucosa, and tongue normal; teeth and gums normal Neck: Supple, no lymphadenopathy;  thyroid:  no enlargement/tenderness/nodules; no carotid bruit or JVD Lungs: Clear to auscultation bilaterally without wheezes, rales or ronchi; respirations unlabored Heart: Regular rate and rhythm, S1 and S2 normal, no murmur, rubor gallop Abdomen: Soft, non-tender, nondistended, normoactive bowel sounds,  no masses, no hepatosplenomegaly Extremities: No clubbing, cyanosis or edema Pulses: 2+ and symmetric all extremities Skin:  Skin color, texture, turgor normal, no rashes or lesions Lymph nodes: Cervical, supraclavicular, and axillary nodes normal Neurologic:  CNII-XII intact, normal strength, sensation and gait; reflexes 2+ and symmetric throughout Psych: Normal mood, affect, hygiene and grooming.  ASSESSMENT/PLAN: Chronic back pain, unspecified back location, unspecified back pain laterality  Asthma, chronic, mild persistent, uncomplicated  S/P CABG x 3: (lima-lad,svg-om,svg-rca)  Hyperlipidemia with target LDL less than 70  CAD, multiple vessel  Coronary artery disease involving coronary  bypass graft of native heart with angina pectoris (HCC)  Mild persistent chronic asthma without complication  Personal history of colonic polyp - adenoma  Lumbar radiculopathy, right  Essential hypertension  Osteopenia, unspecified location  Presence of drug coated stent in right coronary artery  Urinary retention  History of renal cell cancer  Aortic atherosclerosis (HCC)  Vitamin D deficiency      Immunization recommendations discussed.  Colonoscopy recommendations reviewed   Medicare Attestation I have personally reviewed: The patient's medical and social history Their use of alcohol, tobacco or illicit drugs Their current medications and supplements The patient's functional ability including ADLs,fall risks, home safety risks, cognitive, and hearing and visual impairment Diet and physical activities Evidence for depression or mood disorders  The patient's weight, height, and BMI have been recorded in the chart.  I have made referrals, counseling, and provided education to the patient based on review of the above and I have provided the patient with a written personalized care plan for preventive services.     Jill Alexanders, MD   12/16/2020

## 2020-12-17 LAB — CBC WITH DIFFERENTIAL/PLATELET
Basophils Absolute: 0 10*3/uL (ref 0.0–0.2)
Basos: 1 %
EOS (ABSOLUTE): 0.2 10*3/uL (ref 0.0–0.4)
Eos: 4 %
Hematocrit: 38 % (ref 34.0–46.6)
Hemoglobin: 12.5 g/dL (ref 11.1–15.9)
Immature Grans (Abs): 0 10*3/uL (ref 0.0–0.1)
Immature Granulocytes: 0 %
Lymphocytes Absolute: 1.4 10*3/uL (ref 0.7–3.1)
Lymphs: 24 %
MCH: 28.7 pg (ref 26.6–33.0)
MCHC: 32.9 g/dL (ref 31.5–35.7)
MCV: 87 fL (ref 79–97)
Monocytes Absolute: 0.4 10*3/uL (ref 0.1–0.9)
Monocytes: 7 %
Neutrophils Absolute: 3.6 10*3/uL (ref 1.4–7.0)
Neutrophils: 64 %
Platelets: 177 10*3/uL (ref 150–450)
RBC: 4.36 x10E6/uL (ref 3.77–5.28)
RDW: 13 % (ref 11.7–15.4)
WBC: 5.7 10*3/uL (ref 3.4–10.8)

## 2020-12-17 LAB — LIPID PANEL
Chol/HDL Ratio: 1.5 ratio (ref 0.0–4.4)
Cholesterol, Total: 122 mg/dL (ref 100–199)
HDL: 83 mg/dL (ref >39)
LDL Chol Calc (NIH): 27 mg/dL (ref 0–99)
Triglycerides: 51 mg/dL (ref 0–149)
VLDL Cholesterol Cal: 12 mg/dL (ref 5–40)

## 2020-12-17 LAB — COMPREHENSIVE METABOLIC PANEL
ALT: 13 IU/L (ref 0–32)
AST: 17 IU/L (ref 0–40)
Albumin/Globulin Ratio: 2 (ref 1.2–2.2)
Albumin: 4.7 g/dL (ref 3.7–4.7)
Alkaline Phosphatase: 149 IU/L — ABNORMAL HIGH (ref 44–121)
BUN/Creatinine Ratio: 19 (ref 12–28)
BUN: 17 mg/dL (ref 8–27)
Bilirubin Total: 0.4 mg/dL (ref 0.0–1.2)
CO2: 26 mmol/L (ref 20–29)
Calcium: 9.2 mg/dL (ref 8.7–10.3)
Chloride: 102 mmol/L (ref 96–106)
Creatinine, Ser: 0.91 mg/dL (ref 0.57–1.00)
Globulin, Total: 2.4 g/dL (ref 1.5–4.5)
Glucose: 113 mg/dL — ABNORMAL HIGH (ref 70–99)
Potassium: 5.1 mmol/L (ref 3.5–5.2)
Sodium: 141 mmol/L (ref 134–144)
Total Protein: 7.1 g/dL (ref 6.0–8.5)
eGFR: 64 mL/min/{1.73_m2} (ref >59)

## 2020-12-17 LAB — VITAMIN D 25 HYDROXY (VIT D DEFICIENCY, FRACTURES): Vit D, 25-Hydroxy: 35.6 ng/mL (ref 30.0–100.0)

## 2021-01-17 DIAGNOSIS — G894 Chronic pain syndrome: Secondary | ICD-10-CM | POA: Diagnosis not present

## 2021-01-17 DIAGNOSIS — M150 Primary generalized (osteo)arthritis: Secondary | ICD-10-CM | POA: Diagnosis not present

## 2021-01-17 DIAGNOSIS — M25562 Pain in left knee: Secondary | ICD-10-CM | POA: Diagnosis not present

## 2021-01-17 DIAGNOSIS — M47812 Spondylosis without myelopathy or radiculopathy, cervical region: Secondary | ICD-10-CM | POA: Diagnosis not present

## 2021-01-17 DIAGNOSIS — Z79891 Long term (current) use of opiate analgesic: Secondary | ICD-10-CM | POA: Diagnosis not present

## 2021-01-24 DIAGNOSIS — M25562 Pain in left knee: Secondary | ICD-10-CM | POA: Diagnosis not present

## 2021-01-28 ENCOUNTER — Other Ambulatory Visit: Payer: Self-pay | Admitting: Family Medicine

## 2021-01-28 DIAGNOSIS — J4530 Mild persistent asthma, uncomplicated: Secondary | ICD-10-CM

## 2021-01-28 DIAGNOSIS — M25562 Pain in left knee: Secondary | ICD-10-CM | POA: Diagnosis not present

## 2021-02-04 ENCOUNTER — Ambulatory Visit: Payer: PPO | Admitting: Neurology

## 2021-02-11 DIAGNOSIS — C641 Malignant neoplasm of right kidney, except renal pelvis: Secondary | ICD-10-CM | POA: Diagnosis not present

## 2021-02-22 ENCOUNTER — Other Ambulatory Visit: Payer: Self-pay

## 2021-02-22 ENCOUNTER — Ambulatory Visit (HOSPITAL_COMMUNITY)
Admission: RE | Admit: 2021-02-22 | Discharge: 2021-02-22 | Disposition: A | Discharge status: Home / Self Care | Payer: PPO | Source: Ambulatory Visit | Attending: Urology | Admitting: Urology

## 2021-02-22 ENCOUNTER — Other Ambulatory Visit (HOSPITAL_COMMUNITY): Payer: Self-pay | Admitting: Urology

## 2021-02-22 DIAGNOSIS — K3189 Other diseases of stomach and duodenum: Secondary | ICD-10-CM | POA: Diagnosis not present

## 2021-02-22 DIAGNOSIS — Z85528 Personal history of other malignant neoplasm of kidney: Secondary | ICD-10-CM | POA: Diagnosis not present

## 2021-02-22 DIAGNOSIS — C641 Malignant neoplasm of right kidney, except renal pelvis: Secondary | ICD-10-CM

## 2021-02-22 DIAGNOSIS — N289 Disorder of kidney and ureter, unspecified: Secondary | ICD-10-CM | POA: Diagnosis not present

## 2021-02-22 DIAGNOSIS — R0602 Shortness of breath: Secondary | ICD-10-CM | POA: Diagnosis not present

## 2021-02-22 DIAGNOSIS — I700 Atherosclerosis of aorta: Secondary | ICD-10-CM | POA: Diagnosis not present

## 2021-02-22 DIAGNOSIS — K828 Other specified diseases of gallbladder: Secondary | ICD-10-CM | POA: Diagnosis not present

## 2021-02-23 ENCOUNTER — Telehealth: Payer: Self-pay | Admitting: Pharmacist

## 2021-02-23 ENCOUNTER — Encounter: Payer: Self-pay | Admitting: Cardiovascular Disease

## 2021-02-23 ENCOUNTER — Ambulatory Visit: Payer: PPO | Admitting: Cardiovascular Disease

## 2021-02-23 DIAGNOSIS — I10 Essential (primary) hypertension: Secondary | ICD-10-CM

## 2021-02-23 DIAGNOSIS — Z951 Presence of aortocoronary bypass graft: Secondary | ICD-10-CM | POA: Diagnosis not present

## 2021-02-23 DIAGNOSIS — E785 Hyperlipidemia, unspecified: Secondary | ICD-10-CM | POA: Diagnosis not present

## 2021-02-23 MED ORDER — REPATHA SURECLICK 140 MG/ML ~~LOC~~ SOAJ: 140.0000 mg | SUBCUTANEOUS | 11 refills | Status: DC

## 2021-02-23 NOTE — Progress Notes (Signed)
02/23/2021 MIAA LATTERELL   [birth year]  [id number]  Primary Physician Denita Lung, MD Primary Cardiologist: Lorretta Harp MD FACP, [place], [place], [place]  HPI:  [name] Long is a 81 y.o.  mildly overweight married Caucasian female, mother of 2, grandmother to 6 grandchildren, who I last saw in the offic 08/11/2020. She ended up being admitted January 12, 2011, with chest pain and was catheterized, revealing a left main 3-vessel disease with an EF of 50% and high anterior hypokinesia, for which she underwent coronary artery bypass grafting by Dr. Tharon Aquas Trigt with a LIMA to her LAD, vein to an obtuse marginal branch and to the right coronary artery. Postop course was uncomplicated. A Myoview in April was normal. Her lipid profile was excellent. She saw Cecilie Kicks in our office on July 15 complaining of chest pain, and the concern was musculoskeletal versus pericarditis. A 2D echocardiogram was entirely normal. She ended up seeing Dr. Prescott Gum, who diagnosed a staph sternal wound infection, requiring hospitalization and debridement. She had a wound VAC on and fortunately has completely healed. Since I saw her in the office a year and a half ago she was complaining of some increased dyspnea on exertion and decreased exercise tolerance.  A Myoview stress test was normal as was a 2D echocardiogram.  It turns out that her increasing shortness of breath is probably related to exacerbation of her asthma at that time.    She has been diagnosed with renal cell carcinoma and is scheduled to undergo laparoscopic partial nephrectomy by Dr. Gilford Rile at [facility] urology.   She underwent meniscus repair by Dr. Latanya Maudlin  of her right knee which did not result in an acceptable clinical outcome and therefore she underwent right total knee replacement. Her last Myoview stress test performed 12/28/2015 was low risk and nonischemic.   She apparently has a new lesion on her kidney which may require surgical  evaluation in the future.  She was complaining of some dyspnea which is new since I saw her a year ago but denies chest pain.  She was admitted on 08/31/2020 with chest pain and shortness of breath.  She underwent cardiac catheterization by Dr. Tamala Julian on that day revealing high-grade distal left main disease with an occluded LAD after the first diagonal branch, patent LIMA to the LAD, patent vein to obtuse marginal branch, occluded vein to the RCA and high-grade calcified proximal dominant RCA stenosis on a bend.  On 09/02/2020 she underwent complex IVUS guided orbital atherectomy with shockwave angioplasty and drug-eluting stenting of the RCA by Dr. Martinique with an excellent result.  She was discharged home the following day.  She thinks that her shortness of breath has been improved somewhat.   Current Meds  Medication Sig   albuterol (PROVENTIL HFA) 108 (90 Base) MCG/ACT inhaler INHALE 2 PUFF AS DIRECTED FOUR TIMES A DAY AS NEEDED   amLODipine (NORVASC) 5 MG tablet Take 1 tablet (5 mg total) by mouth daily.   aspirin EC 81 MG tablet Take 81 mg by mouth daily. Swallow whole.   Calcium Carb-Cholecalciferol (CALTRATE 600+D3 PO) Take 1 tablet by mouth daily.   carisoprodol (SOMA) 350 MG tablet Take 350 mg by mouth every 8 (eight) hours as needed (for arthritis pain).    clopidogrel (PLAVIX) 75 MG tablet Take 1 tablet (75 mg total) by mouth daily with breakfast.   Evolocumab (REPATHA SURECLICK) 983 MG/ML SOAJ Inject 140 mg into the skin every 14 (fourteen) days.  fluticasone-salmeterol (ADVAIR DISKUS) 500-50 MCG/ACT AEPB Inhale 1 puff into the lungs in the morning and at bedtime.   gabapentin (NEURONTIN) 600 MG tablet Take 1,200-1,800 mg by mouth See admin instructions. Take 2 tablets (1200 mg) by mouth in the morning & take 3 tablets (1800 mg) by mouth at night   HYDROmorphone (DILAUDID) 4 MG tablet Take 1 tablet (4 mg total) by mouth every 4 (four) hours as needed for severe pain.   magnesium oxide  (MAG-OX) 400 MG tablet Take 400 mg by mouth at bedtime.   metoprolol tartrate (LOPRESSOR) 25 MG tablet Take 1 tablet (25 mg total) by mouth 2 (two) times daily.   montelukast (SINGULAIR) 10 MG tablet TAKE ONE TABLET BY MOUTH DAILY   Multiple Vitamins-Minerals (PRESERVISION AREDS 2 PO) Take 1 tablet by mouth in the morning and at bedtime.   nitroGLYCERIN (NITROSTAT) 0.4 MG SL tablet Place 1 tablet (0.4 mg total) under the tongue every 5 (five) minutes x 3 doses as needed for chest pain.   silodosin (RAPAFLO) 8 MG CAPS capsule Take 8 mg by mouth daily.      Allergies  Allergen Reactions   Dimenhydrinate Other (See Comments)    "locks up kidneys"   Morphine And Related Nausea And Vomiting   Percocet [Oxycodone-Acetaminophen] Other (See Comments)    hallucinations   Zolpidem Other (See Comments)    Unusual behavior    Social History   Socioeconomic History   Marital status: Married    Spouse name: Not on file   Number of children: Not on file   Years of education: Not on file   Highest education level: Not on file  Occupational History   Not on file  Tobacco Use   Smoking status: Former    Types: Cigarettes    Quit date: 01/12/1980    Years since quitting: 41.1   Smokeless tobacco: Never  Vaping Use   Vaping Use: Never used  Substance and Sexual Activity   Alcohol use: No    Alcohol/week: 0.0 standard drinks   Drug use: No   Sexual activity: Yes  Other Topics Concern   Not on file  Social History Narrative   Right handed   Social Determinants of Health   Financial Resource Strain: Not on file  Food Insecurity: Not on file  Transportation Needs: Not on file  Physical Activity: Not on file  Stress: Not on file  Social Connections: Not on file  Intimate Partner Violence: Not on file     Review of Systems: General: negative for chills, fever, night sweats or weight changes.  Cardiovascular: negative for chest pain, dyspnea on exertion, edema, orthopnea,  palpitations, paroxysmal nocturnal dyspnea or shortness of breath Dermatological: negative for rash Respiratory: negative for cough or wheezing Urologic: negative for hematuria Abdominal: negative for nausea, vomiting, diarrhea, bright red blood per rectum, melena, or hematemesis Neurologic: negative for visual changes, syncope, or dizziness All other systems reviewed and are otherwise negative except as noted above.    Blood pressure 126/64, pulse 89, height 5' 4.5" (1.638 m), weight 184 lb (83.5 kg), SpO2 97 %.  General appearance: alert and no distress Neck: no adenopathy, no carotid bruit, no JVD, supple, symmetrical, trachea midline, and thyroid not enlarged, symmetric, no tenderness/mass/nodules Lungs: clear to auscultation bilaterally Heart: regular rate and rhythm, S1, S2 normal, no murmur, click, rub or gallop Extremities: extremities normal, atraumatic, no cyanosis or edema Pulses: 2+ and symmetric Skin: Skin color, texture, turgor normal. No rashes or lesions  Neurologic: Grossly normal  EKG not performed today  ASSESSMENT AND PLAN:   S/P CABG x 3: (lima-lad,svg-om,svg-rca) History of CAD status post coronary bypass grafting January 2013 by Dr. Calton Golds after cath revealed left main/three-vessel disease with preserved EF.  She had a LIMA to LAD, vein to obtuse marginal branch and to the RCA.  Her postop course was uncomplicated.  She had a Myoview stress test performed 12/28/2015 which was low risk and nonischemic.  She was admitted to the hospital chest pain 08/31/2020 and underwent cardiac catheterization by Dr. Tamala Julian revealing a patent LIMA to the LAD, patent vein to an obtuse marginal branch, occluded vein to the distal RCA and high-grade proximal calcified dominant RCA stenosis.  The decision was initially to treat this medically however she ultimately underwent IVUS guided orbital atherectomy and shockwave lithotripsy of the proximal RCA using drug-eluting stent by Dr.  Martinique on 09/02/2020 with an excellent result.  Her initial symptoms when she saw me in early August was dyspnea which is somewhat improved.  Hyperlipidemia with target LDL less than 70 History of hyperlipidemia on Repatha as well as atorvastatin with lipid profile performed 12/16/2020 revealing total cholesterol 122, LDL of 27 and HDL of 83.  Essential hypertension History of essential hypertension blood pressure measured today at 126/64.  She is on amlodipine, and metoprolol.     Lorretta Harp MD FACP,FACC,FAHA, [facility] 02/23/2021 10:40 AM

## 2021-02-23 NOTE — Patient Instructions (Signed)
Medication Instructions:  Your physician recommends that you continue on your current medications as directed. Please refer to the Current Medication list given to you today.  *If you need a refill on your cardiac medications before your next appointment, please call your pharmacy*   Follow-Up: At [facility], you and your health needs are our priority.  As part of our continuing mission to provide you with exceptional heart care, we have created designated Provider Care Teams.  These Care Teams include your primary Cardiologist (physician) and Advanced Practice Providers (APPs -  Physician Assistants and Nurse Practitioners) who all work together to provide you with the care you need, when you need it.  We recommend signing up for the patient portal called "MyChart".  Sign up information is provided on this After Visit Summary.  MyChart is used to connect with patients for Virtual Visits (Telemedicine).  Patients are able to view lab/test results, encounter notes, upcoming appointments, etc.  Non-urgent messages can be sent to your provider as well.   To learn more about what you can do with MyChart, go to [email or web address].    Your next appointment:   6 month(s)  The format for your next appointment:   In Person  Provider:   Fabian Sharp, PA-C       Then, Quay Burow, MD will plan to see you again in 12 month(s).

## 2021-02-23 NOTE — Assessment & Plan Note (Signed)
History of hyperlipidemia on Repatha as well as atorvastatin with lipid profile performed 12/16/2020 revealing total cholesterol 122, LDL of 27 and HDL of 83.

## 2021-02-23 NOTE — Telephone Encounter (Signed)
Please complete prior authorization for:  Name of medication, dose, and frequency repatha 140mg  sq q 14 days  Lab Orders Requested? no  Which labs? N/a  Estimated date for labs to be scheduled n/a  Does patient need activated copay card? no

## 2021-02-23 NOTE — Telephone Encounter (Signed)
Called and spoke w/pt amd stated that they were approved for repatha, rxsent, advised pt to call back if issues arise. Pt voiced understanding.   Esperansa Sabet (Key: [id number]) Repatha SureClick 140MG /ML auto-injectors   Form [employer] Medicare Electronic Prior Authorization Form 2017 NCPDP Created 36 minutes ago Sent to Plan 35 minutes ago Plan Response 35 minutes ago Submit Clinical Questions less than a minute ago Determination Favorable less than a minute ago Message from Plan [id number] Repatha SureClick 140MG /ML [place] SOAJ Quantity:2;

## 2021-02-23 NOTE — Addendum Note (Signed)
Addended by: Allean Found on: 02/23/2021 12:21 PM   Modules accepted: Orders

## 2021-02-23 NOTE — Assessment & Plan Note (Signed)
History of essential hypertension blood pressure measured today at 126/64.  She is on amlodipine, and metoprolol.

## 2021-02-23 NOTE — Assessment & Plan Note (Signed)
History of CAD status post coronary bypass grafting January 2013 by Dr. Calton Golds after cath revealed left main/three-vessel disease with preserved EF.  She had a LIMA to LAD, vein to obtuse marginal branch and to the RCA.  Her postop course was uncomplicated.  She had a Myoview stress test performed 12/28/2015 which was low risk and nonischemic.  She was admitted to the hospital chest pain 08/31/2020 and underwent cardiac catheterization by Dr. Tamala Julian revealing a patent LIMA to the LAD, patent vein to an obtuse marginal branch, occluded vein to the distal RCA and high-grade proximal calcified dominant RCA stenosis.  The decision was initially to treat this medically however she ultimately underwent IVUS guided orbital atherectomy and shockwave lithotripsy of the proximal RCA using drug-eluting stent by Dr. Martinique on 09/02/2020 with an excellent result.  Her initial symptoms when she saw me in early August was dyspnea which is somewhat improved.

## 2021-03-15 DIAGNOSIS — L570 Actinic keratosis: Secondary | ICD-10-CM | POA: Diagnosis not present

## 2021-03-15 DIAGNOSIS — Z08 Encounter for follow-up examination after completed treatment for malignant neoplasm: Secondary | ICD-10-CM | POA: Diagnosis not present

## 2021-03-15 DIAGNOSIS — Z86007 Personal history of in-situ neoplasm of skin: Secondary | ICD-10-CM | POA: Diagnosis not present

## 2021-03-22 ENCOUNTER — Encounter: Payer: Self-pay | Admitting: Cardiovascular Disease

## 2021-04-12 DIAGNOSIS — Z79891 Long term (current) use of opiate analgesic: Secondary | ICD-10-CM | POA: Diagnosis not present

## 2021-04-12 DIAGNOSIS — G894 Chronic pain syndrome: Secondary | ICD-10-CM | POA: Diagnosis not present

## 2021-04-12 DIAGNOSIS — M150 Primary generalized (osteo)arthritis: Secondary | ICD-10-CM | POA: Diagnosis not present

## 2021-04-12 DIAGNOSIS — M47812 Spondylosis without myelopathy or radiculopathy, cervical region: Secondary | ICD-10-CM | POA: Diagnosis not present

## 2021-04-20 DIAGNOSIS — H1011 Acute atopic conjunctivitis, right eye: Secondary | ICD-10-CM | POA: Diagnosis not present

## 2021-04-22 ENCOUNTER — Other Ambulatory Visit: Payer: Self-pay | Admitting: Family Medicine

## 2021-04-22 DIAGNOSIS — R339 Retention of urine, unspecified: Secondary | ICD-10-CM

## 2021-04-22 MED ORDER — SILODOSIN 8 MG PO CAPS: 8.0000 mg | ORAL_CAPSULE | Freq: Every day | ORAL | 3 refills | Status: DC

## 2021-05-09 DIAGNOSIS — H353131 Nonexudative age-related macular degeneration, bilateral, early dry stage: Secondary | ICD-10-CM | POA: Diagnosis not present

## 2021-05-09 DIAGNOSIS — H1011 Acute atopic conjunctivitis, right eye: Secondary | ICD-10-CM | POA: Diagnosis not present

## 2021-05-09 DIAGNOSIS — H2513 Age-related nuclear cataract, bilateral: Secondary | ICD-10-CM | POA: Diagnosis not present

## 2021-06-11 DIAGNOSIS — L259 Unspecified contact dermatitis, unspecified cause: Secondary | ICD-10-CM | POA: Diagnosis not present

## 2021-06-11 DIAGNOSIS — Z6830 Body mass index (BMI) 30.0-30.9, adult: Secondary | ICD-10-CM | POA: Diagnosis not present

## 2021-07-11 DIAGNOSIS — Z79891 Long term (current) use of opiate analgesic: Secondary | ICD-10-CM | POA: Diagnosis not present

## 2021-07-11 DIAGNOSIS — M150 Primary generalized (osteo)arthritis: Secondary | ICD-10-CM | POA: Diagnosis not present

## 2021-07-11 DIAGNOSIS — M47812 Spondylosis without myelopathy or radiculopathy, cervical region: Secondary | ICD-10-CM | POA: Diagnosis not present

## 2021-07-11 DIAGNOSIS — G894 Chronic pain syndrome: Secondary | ICD-10-CM | POA: Diagnosis not present

## 2021-07-19 ENCOUNTER — Telehealth: Payer: Self-pay | Admitting: Cardiovascular Disease

## 2021-07-19 MED ORDER — NITROGLYCERIN 0.4 MG SL SUBL: 0.4000 mg | SUBLINGUAL_TABLET | SUBLINGUAL | 1 refills | Status: DC | PRN

## 2021-07-19 NOTE — Telephone Encounter (Signed)
*  STAT* If patient is at the pharmacy, call can be transferred to refill team.   1. Which medications need to be refilled? (please list name of each medication and dose if known)  nitroGLYCERIN (NITROSTAT) 0.4 MG SL tablet  2. Which pharmacy/location (including street and city if local pharmacy) is medication to be sent to? [facility] [id number] - [place], [place]  3. Do they need a 30 day or 90 day supply? 90 day

## 2021-08-04 ENCOUNTER — Other Ambulatory Visit: Payer: Self-pay | Admitting: Family Medicine

## 2021-08-04 DIAGNOSIS — J4530 Mild persistent asthma, uncomplicated: Secondary | ICD-10-CM

## 2021-08-16 DIAGNOSIS — L821 Other seborrheic keratosis: Secondary | ICD-10-CM | POA: Diagnosis not present

## 2021-08-16 DIAGNOSIS — L570 Actinic keratosis: Secondary | ICD-10-CM | POA: Diagnosis not present

## 2021-08-16 DIAGNOSIS — L814 Other melanin hyperpigmentation: Secondary | ICD-10-CM | POA: Diagnosis not present

## 2021-08-16 DIAGNOSIS — Z08 Encounter for follow-up examination after completed treatment for malignant neoplasm: Secondary | ICD-10-CM | POA: Diagnosis not present

## 2021-08-16 DIAGNOSIS — Z85828 Personal history of other malignant neoplasm of skin: Secondary | ICD-10-CM | POA: Diagnosis not present

## 2021-08-16 DIAGNOSIS — D225 Melanocytic nevi of trunk: Secondary | ICD-10-CM | POA: Diagnosis not present

## 2021-08-17 ENCOUNTER — Other Ambulatory Visit: Payer: Self-pay | Admitting: Family Medicine

## 2021-08-17 DIAGNOSIS — R339 Retention of urine, unspecified: Secondary | ICD-10-CM

## 2021-08-31 ENCOUNTER — Telehealth: Payer: Self-pay | Admitting: Cardiovascular Disease

## 2021-08-31 MED ORDER — METOPROLOL TARTRATE 25 MG PO TABS: 25.0000 mg | ORAL_TABLET | Freq: Two times a day (BID) | ORAL | 3 refills | Status: DC

## 2021-08-31 NOTE — Telephone Encounter (Signed)
*  STAT* If patient is at the pharmacy, call can be transferred to refill team.   1. Which medications need to be refilled? (please list name of each medication and dose if known) metoprolol tartrate (LOPRESSOR) 25 MG tablet  2. Which pharmacy/location (including street and city if local pharmacy) is medication to be sent to?[facility] [id number] - [place], [place]  3. Do they need a 30 day or 90 day supply? [place]

## 2021-09-07 ENCOUNTER — Encounter: Payer: Self-pay | Admitting: Internal Medicine

## 2021-09-14 ENCOUNTER — Telehealth: Payer: Self-pay | Admitting: Cardiovascular Disease

## 2021-09-14 NOTE — Telephone Encounter (Addendum)
Patient reports increasing sob with dizziness at times. Her legs and arms fell heavy when she walks. She denies feeling any palpitations or chest pain. BP while on phone 138/75, P 88. Appointment made with Thomasene Mohair, NP for 9/18. Recommended that if sob or dizziness worsens, to go to the ED. She verbalized understanding.

## 2021-09-14 NOTE — Telephone Encounter (Signed)
Pt c/o Shortness Of Breath: STAT if SOB developed within the last 24 hours or pt is noticeably SOB on the phone  1. Are you currently SOB (can you hear that pt is SOB on the phone)?  No  2. How long have you been experiencing SOB?  About 1 month  3. Are you SOB when sitting or when up moving around?  When up and moving around   4. Are you currently experiencing any other symptoms?  Weakness in legs, low energy

## 2021-09-18 NOTE — Progress Notes (Unsigned)
Cardiology Clinic Note   Patient Name: [name] Long Date of Encounter: 09/19/2021  Primary Care Provider:  Denita Lung, MD Primary Cardiologist:  Quay Burow, MD  Patient Profile    [name] Long 81 year old female presents the clinic today for evaluation of her shortness of breath and dizziness.  Past Medical History    Past Medical History:  Diagnosis Date   Abnormal EKG 01/12/2011   Allergy    RHINITIS   Arthritis    neck, back knees, hips   Asthma    Asthma 01/12/2011   Back pain, chronic--plans were for cervical disc surgery week of 01/16/11 01/12/2011   Blood clot in vein 2004   L leg   Blood transfusion without reported diagnosis 2004   Chronic low back pain    Complication of anesthesia    post op bladder retention after surgery   Connective tissue disease ([place])    Coronary artery disease    Crescendo angina, with EKG changes, new. 01/12/2011   Dyspnea    Fibromyalgia    Fibromyalgia    GERD (gastroesophageal reflux disease)    Headache    migraine - several in a yr.    History of stress test 04/2011   Normal stress test   Hx of echocardiogram 07/19/2011   EF 50-55%. There is mild annular calcification, there is trace migtral regurgitation, there is trace tricupid regurgitation, pericardium is thick, there is no pericardial effusion.   Hypertension    Myocardial infarction [facility]) 2013   triple heart bypass   Neuromuscular disorder (HCC)    fibromyalgia    Osteoporosis    OSTEOPENIA   Personal history of colonic polyp - adenoma 08/05/2013   08/2013 - 7 mm adenoma - consider repeat colonoscopy 2020 (age 89)   Pneumonia 2018   numerous times, only admitted one time for pneumonia but she has had pneumonia numerous times;   02-19-2018 END OF NOVEMBER 2019  WAS ADMITTED FOR ABOUT  A WEEK DUE TO "DOUBLE PNEUMONIA", REPORTS TODAY FEELING BACK TO HER NORMAL , DENIES SOB, O2 SAT AT 91 ROOM AIR , AFEBRILE, OTHER VSS    PONV (postoperative nausea and vomiting)     PPD positive    Status post coronary artery bypass grafting    Urinary incontinence    post op, "sleepy bladder", urinar retention    Past Surgical History:  Procedure Laterality Date   [place] Left 03/27/2019   Benign fibroaipose tissue no evidence of malignancy   BLADDER REPAIR  1985   BREAST SURGERY  1992   REDUCTION, x3 surgeries overall on breast for augmentation    CARDIAC CATHETERIZATION  2013   CARPAL TUNNEL RELEASE Bilateral 1988, [place]   CORONARY ARTERY BYPASS GRAFT  01/14/2011   Procedure: CORONARY ARTERY BYPASS GRAFTING (CABG);  Surgeon: Tharon Aquas Adelene Idler, MD;  Location: [place];  Service: Open Heart Surgery;  Laterality: N/A;  CABG x three, using right greater saphenous vein harvested endoscopically   CORONARY ATHERECTOMY N/A 09/02/2020   Procedure: CORONARY ATHERECTOMY;  Surgeon: Martinique, Peter M, MD;  Location: [place] [facility];  Service: Cardiovascular;  Laterality: N/A;   CORONARY STENT INTERVENTION N/A 09/02/2020   Procedure: CORONARY STENT INTERVENTION;  Surgeon: Martinique, Peter M, MD;  Location: [place] [facility];  Service: Cardiovascular;  Laterality: N/A;   EYE SURGERY Bilateral 1985  radiokerototomy   INTRAVASCULAR LITHOTRIPSY  09/02/2020   Procedure: INTRAVASCULAR LITHOTRIPSY;  Surgeon: Martinique, Peter M, MD;  Location: [place] [facility];  Service: Cardiovascular;;   INTRAVASCULAR ULTRASOUND/IVUS N/A 09/02/2020   Procedure: Intravascular Ultrasound/IVUS;  Surgeon: Martinique, Peter M, MD;  Location: [place] [facility];  Service: Cardiovascular;  Laterality: N/A;   LEFT HEART CATH AND CORS/GRAFTS ANGIOGRAPHY N/A 08/31/2020   Procedure: LEFT HEART CATH AND CORS/GRAFTS ANGIOGRAPHY;  Surgeon: Belva Crome, MD;  Location: [place] [facility];  Service: Cardiovascular;  Laterality: N/A;   LEFT HEART CATHETERIZATION WITH CORONARY ANGIOGRAM N/A 01/13/2011   Procedure: LEFT HEART  CATHETERIZATION WITH CORONARY ANGIOGRAM;  Surgeon: Troy Sine, MD;  Location: [facility];  Service: Cardiovascular;  Laterality: N/A;   PUBOVAGINAL SLING  2002   ROBOT ASSISTED LAPAROSCOPIC NEPHRECTOMY Right 02/27/2018   Procedure: XI ROBOTIC ASSISTE PARTIAL NEPHRECTOMY;  Surgeon: Ceasar Mons, MD;  Location: [facility];  Service: Urology;  Laterality: Right;   STERNAL WOUND DEBRIDEMENT  07/27/2011   Procedure: STERNAL WOUND DEBRIDEMENT;  followed by wound vac Surgeon: Ivin Poot, MD;  Location: [place];  Service: Open Heart Surgery;  Laterality: N/A;   TONSILLECTOMY  1952   TOTAL HIP ARTHROPLASTY Right 08/22/2016   Procedure: TOTAL HIP ARTHROPLASTY ANTERIOR APPROACH;  Surgeon: Melrose Nakayama, MD;  Location: [place];  Service: Orthopedics;  Laterality: Right;   TOTAL KNEE ARTHROPLASTY Right 07/22/2019   Procedure: RIGHT TOTAL KNEE ARTHROPLASTY;  Surgeon: Melrose Nakayama, MD;  Location: [facility];  Service: Orthopedics;  Laterality: Right;   TRIGGER FINGER RELEASE Right 2014   4th finger   TROCHANTERIC BURSA EXCISION Right    bursa removed, R hip   TUBAL LIGATION      Allergies  Allergies  Allergen Reactions   Dimenhydrinate Other (See Comments)    "locks up kidneys"   Morphine And Related Nausea And Vomiting   Percocet [Oxycodone-Acetaminophen] Other (See Comments)    hallucinations   Zolpidem Other (See Comments)    Unusual behavior    History of Present Illness    [name] Long has a PMH of coronary artery disease, essential hypertension, allergic rhinitis, GERD, osteopenia, fibromyalgia, coronary artery disease status post CABG x3 in 2013.  She was seen by Cecilie Kicks 7/15.  During that time she was concerned about chest discomfort.  It was felt that she may have musculoskeletal pain versus pericarditis.  Echocardiogram was normal.  She was seen in follow-up by Dr. 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  She required hospitalization and  debridement.  She had a wound VAC placed in her wound completely healed.  She underwent nuclear stress testing which showed a normal result and also had normal echocardiogram.  It was felt that her increase in shortness of breath was related to exacerbation of her asthma at that time.  She was admitted 08/31/2020 with chest pain and shortness of breath.  She underwent cardiac catheterization by Dr. Tamala Julian.  She was noted to have high-grade distal left main disease with occluded LAD after her first diagonal branch and patent LIMA-LAD, patent SVG-OM, occluded SVG-RCA and high-grade calcified proximal RCA.  She underwent complex IVUS guided orbital arthrectomy with shockwave angioplasty and received DES of her RCA by Dr. Martinique on 09/02/2020.  She was discharged the following day.  She felt that her shortness of breath had somewhat improved.  She contacted the nurse triage line on 09/14/2021.  During that time she reported  a blood pressure of 138/75 and a pulse of 88.  She noted that her legs and arm felt heavy when she would walk.  She noted no palpitations or chest pain.  She did report shortness of breath and dizziness at times.  She presents to the clinic today for follow-up evaluation and states over the last couple months she has noticed increased dyspnea on exertion and dizziness.  She notices dizziness each time she bends down.  She has been trying to stay physically active but is somewhat limited due to her back pain.  She has been going to the gym 3 days/week and has been trying to exercise for 30 minutes.  She reports increased dyspnea with these activities.  She does not feel that these symptoms are similar to her previous anginal symptoms.  We reviewed her previous cardiac catheterization.  I have asked her to increase her p.o. hydration, may drink sports drink, we will order an echocardiogram, order CBC BMP and plan follow-up in 1-2 months.  Today she denies chest pain, shortness of breath, lower  extremity edema, fatigue, palpitations, melena, hematuria, hemoptysis, diaphoresis, weakness, presyncope, syncope, orthopnea, and PND.    Home Medications    Prior to Admission medications   Medication Sig Start Date End Date Taking? Authorizing Provider  albuterol (PROVENTIL HFA) 108 (90 Base) MCG/ACT inhaler INHALE 2 PUFF AS DIRECTED FOUR TIMES A DAY AS NEEDED 12/16/20   Denita Lung, MD  amLODipine (NORVASC) 5 MG tablet Take 1 tablet (5 mg total) by mouth daily. 10/04/20   Ledora Bottcher, PA  aspirin EC 81 MG tablet Take 81 mg by mouth daily. Swallow whole.    [provider]  atorvastatin (LIPITOR) 20 MG tablet Take 1 tablet (20 mg total) by mouth daily. 10/04/20 01/02/21  [facility], Tami Lin, PA  Calcium Carb-Cholecalciferol (CALTRATE 600+D3 PO) Take 1 tablet by mouth daily.    [provider]  carisoprodol (SOMA) 350 MG tablet Take 350 mg by mouth every 8 (eight) hours as needed (for arthritis pain).     [provider]  clopidogrel (PLAVIX) 75 MG tablet Take 1 tablet (75 mg total) by mouth daily with breakfast. 11/18/20   [facility], Tami Lin, PA  Evolocumab (REPATHA SURECLICK) 426 MG/ML SOAJ Inject 140 mg into the skin every 14 (fourteen) days. 02/23/21   Lorretta Harp, MD  fluticasone-salmeterol (ADVAIR DISKUS) 500-50 MCG/ACT AEPB Inhale 1 puff into the lungs in the morning and at bedtime. 12/16/20   Denita Lung, MD  gabapentin (NEURONTIN) 600 MG tablet Take 1,200-1,800 mg by mouth See admin instructions. Take 2 tablets (1200 mg) by mouth in the morning & take 3 tablets (1800 mg) by mouth at night    [provider]  HYDROmorphone (DILAUDID) 4 MG tablet Take 1 tablet (4 mg total) by mouth every 4 (four) hours as needed for severe pain. 02/27/18   Debbrah Alar, PA-C  magnesium oxide (MAG-OX) 400 MG tablet Take 400 mg by mouth at bedtime.    [provider]  metoprolol tartrate (LOPRESSOR) 25 MG tablet Take 1 tablet (25 mg total) by  mouth 2 (two) times daily. 08/31/21   Lorretta Harp, MD  montelukast (SINGULAIR) 10 MG tablet TAKE ONE TABLET BY MOUTH DAILY 08/04/21   Denita Lung, MD  Multiple Vitamins-Minerals (PRESERVISION AREDS 2 PO) Take 1 tablet by mouth in the morning and at bedtime.    [provider]  nitroGLYCERIN (NITROSTAT) 0.4 MG SL tablet Place 1 tablet (  0.4 mg total) under the tongue every 5 (five) minutes x 3 doses as needed for chest pain. 07/19/21   Lorretta Harp, MD  silodosin (RAPAFLO) 8 MG CAPS capsule TAKE ONE CAPSULE BY MOUTH DAILY 08/17/21   Denita Lung, MD    Family History    Family History  Adopted: Yes  Problem Relation Age of Onset   Hypertension Brother    is adopted.   Social History    Social History   Socioeconomic History   Marital status: Married    Spouse name: Not on file   Number of children: Not on file   Years of education: Not on file   Highest education level: Not on file  Occupational History   Not on file  Tobacco Use   Smoking status: Former    Types: Cigarettes    Quit date: 01/12/1980    Years since quitting: 41.7   Smokeless tobacco: Never  Vaping Use   Vaping Use: Never used  Substance and Sexual Activity   Alcohol use: No    Alcohol/week: 0.0 standard drinks of alcohol   Drug use: No   Sexual activity: Yes  Other Topics Concern   Not on file  Social History Narrative   Right handed   Social Determinants of Health   Financial Resource Strain: Not on file  Food Insecurity: Not on file  Transportation Needs: Not on file  Physical Activity: Not on file  Stress: Not on file  Social Connections: Not on file  Intimate Partner Violence: Not on file     Review of Systems    General:  No chills, fever, night sweats or weight changes.  Cardiovascular:  No chest pain, dyspnea on exertion, edema, orthopnea, palpitations, paroxysmal nocturnal dyspnea. Dermatological: No rash, lesions/masses Respiratory: No cough, dyspnea Urologic:  No hematuria, dysuria Abdominal:   No nausea, vomiting, diarrhea, bright red blood per rectum, melena, or hematemesis Neurologic:  No visual changes, wkns, changes in mental status. All other systems reviewed and are otherwise negative except as noted above.  Physical Exam    VS:  BP 118/89   Pulse 91   Ht '5\' 4"'$  (1.626 m)   Wt 185 lb 2 oz (84 kg)   SpO2 100%   BMI 31.78 kg/m  , BMI Body mass index is 31.78 kg/m. GEN: Well nourished, well developed, in no acute distress. HEENT: normal. Neck: Supple, no JVD, carotid bruits, or masses. Cardiac: RRR, no murmurs, rubs, or gallops. No clubbing, cyanosis, edema.  Radials/DP/PT 2+ and equal bilaterally.  Respiratory:  Respirations regular and unlabored, clear to auscultation bilaterally. GI: Soft, nontender, nondistended, BS + x 4. MS: no deformity or atrophy. Skin: warm and dry, no rash. Neuro:  Strength and sensation are intact. Psych: Normal affect.  Accessory Clinical Findings    Recent Labs: 12/16/2020: ALT 13; BUN 17; Creatinine, Ser 0.91; Hemoglobin 12.5; Platelets 177; Potassium 5.1; Sodium 141   Recent Lipid Panel    Component Value Date/Time   CHOL 122 12/16/2020 0933   TRIG 51 12/16/2020 0933   HDL 83 12/16/2020 0933   CHOLHDL 1.5 12/16/2020 0933   CHOLHDL 2.0 09/01/2020 0241   VLDL 11 09/01/2020 0241   LDLCALC 27 12/16/2020 0933         ECG personally reviewed by me today-none today.  Echocardiogram 09/01/2020  IMPRESSIONS     1. Left ventricular ejection fraction, by estimation, is 55 to 60%. The  left ventricle has normal function. The left ventricle has  no regional  wall motion abnormalities. There is mild concentric left ventricular  hypertrophy. Left ventricular diastolic  parameters are indeterminate.   2. Right ventricular systolic function is low normal. The right  ventricular size is normal.   3. The mitral valve is grossly normal. Trivial mitral valve  regurgitation. No evidence of mitral  stenosis.   4. The aortic valve has an indeterminant number of cusps. Aortic valve  regurgitation is not visualized. No aortic stenosis is present.   5. The inferior vena cava is normal in size with greater than 50%  respiratory variability, suggesting right atrial pressure of 3 mmHg.   Comparison(s): No significant change from prior study.   Conclusion(s)/Recommendation(s): Normal biventricular function without  evidence of hemodynamically significant valvular heart disease.   Cardiac catheterization 09/02/2020    Prox RCA lesion is 80% stenosed.   A drug-eluting stent was successfully placed using a SYNERGY XD 3.0X24.   Post intervention, there is a 0% residual stenosis.   Successful PCI of the proximal RCA with IVUS guidance, orbital atherectomy, Shockwave lithotripsy and DES x 1   Plan: DAPT for one year. If stable anticipate DC in am.  Assessment & Plan   1.  DOE, SOB, dizziness-notes increased work of breathing with increased physical activity.  Symptoms have been ongoing for the past several days. Order echocardiogram Maintain p.o. hydration Allow pause before increasing physical activity changing positions Order CBC, BMP Wear lower extremity support stockings-[place] support stocking sheet given  Coronary artery disease-no chest pain today.  No recent episodes of chest discomfort.  Status post CABG x3 by Dr. Darcey Nora in 2013.  Had subsequent cardiac catheterization 09/02/2020 and underwent orbital arthrectomy by Dr. Martinique.  A DES was placed in her RCA. Continue aspirin, amlodipine, Plavix, metoprolol, nitroglycerin Heart healthy low-sodium diet-salty 6 given Increase physical activity as tolerated  Hyperlipidemia-LDL 27 on 12/16/20 Continue aspirin, Repatha, atorvastatin Heart healthy low-sodium high-fiber diet Increase physical activity as tolerated  Essential hypertension-BP today 118/89 Continue amlodipine, Lopressor, nitroglycerin Heart healthy low-sodium  diet-salty 6 given Increase physical activity as tolerated   Disposition: Follow-up with Dr. Gwenlyn Found in 1-2 months.   Jossie Ng. Nadiya Pieratt NP-C     09/19/2021, 3:44 PM [place] [address] Office [phone] Fax [phone]  Notice: This dictation was prepared with Dragon dictation along with smaller phrase technology. Any transcriptional errors that result from this process are unintentional and may not be corrected upon review.  I spent 14 minutes examining this patient, reviewing medications, and using patient centered shared decision making involving her cardiac care.  Prior to her visit I spent greater than 20 minutes reviewing her past medical history,  medications, and prior cardiac tests.

## 2021-09-19 ENCOUNTER — Ambulatory Visit: Payer: PPO | Attending: General Practice | Admitting: General Practice

## 2021-09-19 ENCOUNTER — Encounter: Payer: Self-pay | Admitting: General Practice

## 2021-09-19 VITALS — BP 118/89 | HR 91 | Ht 64.0 in | Wt 185.1 lb

## 2021-09-19 DIAGNOSIS — I10 Essential (primary) hypertension: Secondary | ICD-10-CM | POA: Diagnosis not present

## 2021-09-19 DIAGNOSIS — E782 Mixed hyperlipidemia: Secondary | ICD-10-CM | POA: Diagnosis not present

## 2021-09-19 DIAGNOSIS — R42 Dizziness and giddiness: Secondary | ICD-10-CM

## 2021-09-19 DIAGNOSIS — R0609 Other forms of dyspnea: Secondary | ICD-10-CM

## 2021-09-19 DIAGNOSIS — I25709 Atherosclerosis of coronary artery bypass graft(s), unspecified, with unspecified angina pectoris: Secondary | ICD-10-CM

## 2021-09-19 NOTE — Patient Instructions (Signed)
Medication Instructions:  The current medical regimen is effective;  continue present plan and medications as directed. Please refer to the Current Medication list given to you today.  *If you need a refill on your cardiac medications before your next appointment, please call your pharmacy*  Lab Work: [place] If you have labs (blood work) drawn today and your tests are completely normal, you will receive your results only by: [place] (if you have MyChart) OR A paper copy in the mail If you have any lab test that is abnormal or we need to change your treatment, we will call you to review the results.  Testing/Procedures: Echocardiogram - Your physician has requested that you have an echocardiogram. Echocardiography is a painless test that uses sound waves to create images of your heart. It provides your doctor with information about the size and shape of your heart and how well your heart's chambers and valves are working. This procedure takes approximately one hour. There are no restrictions for this procedure.   Follow-Up: At [facility], you and your health needs are our priority.  As part of our continuing mission to provide you with exceptional heart care, we have created designated Provider Care Teams.  These Care Teams include your primary Cardiologist (physician) and Advanced Practice Providers (APPs -  Physician Assistants and Nurse Practitioners) who all work together to provide you with the care you need, when you need it.  We recommend signing up for the patient portal called "MyChart".  Sign up information is provided on this After Visit Summary.  MyChart is used to connect with patients for Virtual Visits (Telemedicine).  Patients are able to view lab/test results, encounter notes, upcoming appointments, etc.  Non-urgent messages can be sent to your provider as well.   To learn more about what you can do with MyChart, go to [email or web address].     Your next appointment:   AFTER  ECHO  The format for your next appointment:   In Person  Provider:   Quay Burow, MD     Other Instructions INCREASE HYDRATION ADD 8 OZ SPORTS DRINKS  PLEASE PURCHASE AND WEAR COMPRESSION STOCKINGS DAILY AND TAKE OFF AT BEDTIME. Compression stockings are elastic socks that squeeze the legs. They help to increase blood flow to the legs and to decrease swelling in the legs from fluid retention, and reduce the chance of developing blood clots in the lower legs. Please put on in the AM when dressing and off at night when dressing for bed.  LET THEM KNOW THAT YOU NEED KNEE HIGH'S WITH COMPRESSION OF 15-20 mmhg.  [employer]  [place] ([place] [phone]); [place], [place] [address]; [phone]  EMAIL   [email or web address].  PLEASE MAKE SURE TO ELEVATE YOUR FEET & LEGS WHILE SITTING, THIS WILL HELP WITH THE SWELLING ALSO.    Important Information About Sugar

## 2021-09-20 LAB — BASIC METABOLIC PANEL
BUN/Creatinine Ratio: 23 (ref 12–28)
BUN: 26 mg/dL (ref 8–27)
CO2: 28 mmol/L (ref 20–29)
Calcium: 9.3 mg/dL (ref 8.7–10.3)
Chloride: 100 mmol/L (ref 96–106)
Creatinine, Ser: 1.11 mg/dL — ABNORMAL HIGH (ref 0.57–1.00)
Glucose: 130 mg/dL — ABNORMAL HIGH (ref 70–99)
Potassium: 5.2 mmol/L (ref 3.5–5.2)
Sodium: 141 mmol/L (ref 134–144)
eGFR: 51 mL/min/{1.73_m2} — ABNORMAL LOW (ref >59)

## 2021-09-20 LAB — CBC
Hematocrit: 39.9 % (ref 34.0–46.6)
Hemoglobin: 13 g/dL (ref 11.1–15.9)
MCH: 28.8 pg (ref 26.6–33.0)
MCHC: 32.6 g/dL (ref 31.5–35.7)
MCV: 89 fL (ref 79–97)
Platelets: 195 10*3/uL (ref 150–450)
RBC: 4.51 x10E6/uL (ref 3.77–5.28)
RDW: 12.9 % (ref 11.7–15.4)
WBC: 7 10*3/uL (ref 3.4–10.8)

## 2021-09-22 ENCOUNTER — Other Ambulatory Visit: Payer: Self-pay

## 2021-09-22 MED ORDER — AMLODIPINE BESYLATE 5 MG PO TABS: 5.0000 mg | ORAL_TABLET | Freq: Every day | ORAL | 3 refills | Status: DC

## 2021-09-30 ENCOUNTER — Encounter: Payer: Self-pay | Admitting: Family Medicine

## 2021-10-06 ENCOUNTER — Ambulatory Visit (INDEPENDENT_AMBULATORY_CARE_PROVIDER_SITE_OTHER): Payer: PPO

## 2021-10-06 DIAGNOSIS — R0609 Other forms of dyspnea: Secondary | ICD-10-CM | POA: Diagnosis not present

## 2021-10-06 LAB — ECHOCARDIOGRAM COMPLETE
Area-P 1/2: 4.06 cm2
S' Lateral: 2.66 cm

## 2021-10-10 DIAGNOSIS — M47812 Spondylosis without myelopathy or radiculopathy, cervical region: Secondary | ICD-10-CM | POA: Diagnosis not present

## 2021-10-10 DIAGNOSIS — Z79891 Long term (current) use of opiate analgesic: Secondary | ICD-10-CM | POA: Diagnosis not present

## 2021-10-10 DIAGNOSIS — G894 Chronic pain syndrome: Secondary | ICD-10-CM | POA: Diagnosis not present

## 2021-10-10 DIAGNOSIS — M150 Primary generalized (osteo)arthritis: Secondary | ICD-10-CM | POA: Diagnosis not present

## 2021-10-11 ENCOUNTER — Ambulatory Visit
Admission: RE | Admit: 2021-10-11 | Discharge: 2021-10-11 | Disposition: A | Discharge status: Home / Self Care | Payer: PPO | Source: Ambulatory Visit | Attending: Family Medicine | Admitting: Family Medicine

## 2021-10-11 ENCOUNTER — Encounter: Payer: Self-pay | Admitting: Internal Medicine

## 2021-10-11 ENCOUNTER — Ambulatory Visit (INDEPENDENT_AMBULATORY_CARE_PROVIDER_SITE_OTHER): Payer: PPO | Admitting: Family Medicine

## 2021-10-11 VITALS — BP 120/70 | HR 96 | Temp 97.2°F | Wt 181.6 lb

## 2021-10-11 DIAGNOSIS — R0602 Shortness of breath: Secondary | ICD-10-CM

## 2021-10-11 DIAGNOSIS — H6691 Otitis media, unspecified, right ear: Secondary | ICD-10-CM | POA: Diagnosis not present

## 2021-10-11 DIAGNOSIS — Z23 Encounter for immunization: Secondary | ICD-10-CM | POA: Diagnosis not present

## 2021-10-11 MED ORDER — AMOXICILLIN-POT CLAVULANATE 875-125 MG PO TABS: 1.0000 | ORAL_TABLET | Freq: Two times a day (BID) | ORAL | 0 refills | Status: DC

## 2021-10-11 NOTE — Progress Notes (Signed)
   Subjective:    Patient ID: [name] Long, female    DOB: [birth year], 81 y.o.   MRN: [id number]  HPI She is here for evaluation of a several month history of earache mainly on the right but no sneezing, itchy watery eyes, rhinorrhea, sore throat, fever or chills.  At the end of the encounter she then mentioned difficulty with shortness of breath and had seen cardiology.  Apparently an echo was done which was negative.  Shortness of breath is usually with physical activity.   Review of Systems     Objective:   Physical Exam Right TM is slightly dull and erythematous, left is normal.  Throat is clear.  Neck is supple without adenopathy.  Cardiac exam shows regular rhythm without murmurs or gallops.  Lungs are clear to auscultation.       Assessment & Plan:  Right otitis media, unspecified otitis media type - Plan: amoxicillin-clavulanate (AUGMENTIN) 875-125 MG tablet  Shortness of breath - Plan: DG Chest 2 View  Need for COVID-19 vaccine - Plan: PFIZER Comirnaty(GRAY TOP)COVID-19 Vaccine She will keep me informed about the ear trouble.  Do a chest x-ray to look at her lungs and probably get her back for PFT pending x-ray results.

## 2021-10-12 ENCOUNTER — Telehealth: Payer: Self-pay | Admitting: Family Medicine

## 2021-10-12 NOTE — Telephone Encounter (Signed)
Nvm pt listened to her vm and called back. We scheduled the PFT visit for Monday 10/17/21

## 2021-10-12 NOTE — Telephone Encounter (Signed)
Pt called you back stated she is at home and will be waiting on your call.

## 2021-10-17 ENCOUNTER — Encounter: Payer: Self-pay | Admitting: Family Medicine

## 2021-10-17 ENCOUNTER — Ambulatory Visit: Payer: PPO | Admitting: Family Medicine

## 2021-10-17 VITALS — BP 124/74 | HR 94 | Temp 98.3°F | Wt 182.6 lb

## 2021-10-17 DIAGNOSIS — R0602 Shortness of breath: Secondary | ICD-10-CM

## 2021-10-17 NOTE — Progress Notes (Signed)
She is here for pulmonary function testing.  The PFT was unable to be done due to technical issues.  She will be referred to pulmonology for further evaluation of her shortness of breath.

## 2021-10-24 ENCOUNTER — Encounter: Payer: Self-pay | Admitting: Internal Medicine

## 2021-10-26 NOTE — Progress Notes (Unsigned)
Cardiology Office Note:    Date:  10/27/2021   ID:  [name] Long, DOB Aug 26, 1940, MRN [id number]  PCP:  Denita Lung, MD   [facility] Providers Cardiologist:  Quay Burow, MD Cardiology APP:  Ledora Bottcher, PA     Referring MD: Denita Lung, MD   CC: Here for follow up of dizziness and DOE  History of Present Illness:    [name] Long is a 81 y.o. female with a hx of the following:  CAD, s/p CABG x 3 in 2013 HTN GERD Fibromyalgia Hx of sternal infection s/p debridement and wound VAC Hx of renal cell cancer Asthma   In 2015 she was seen by Cecilie Kicks, NP and she was concerned about chest discomfort, it was felt that she was having musculoskeletal pain versus pericarditis.  Echocardiogram was normal.  At follow-up she was seen by Dr. Prescott Gum who diagnosed her with staph infection of sternum, she required hospitalization and debridement, was placed on wound VAC which completely healed.  Underwent nuclear stress test that was normal, also had normal echo at that time.  It was felt that shortness of breath was related to asthma exacerbation at that time.  Was admitted in August 2022 with shortness of breath and chest pain.  Cardiac cath was performed by Dr. Tamala Julian that revealed a high-grade distal left main disease with occluded LAD after her first diagonal branch and patent SVG-OM, LIMA-LAD, occluded SVG-RCA, and high-grade calcified proximal RCA.  She also underwent complex IVUS guided orbital arthrectomy with shockwave angioplasty and received DES to her RCA by Dr. Martinique in September 2022.  After she was discharged she felt that her shortness of breath had somewhat improved.  Most recently she contacted our nurse triage line on September 14, 2021 and reported a BP of 138/75 and pulse of 88, reported heaviness in arms and legs when she walked, noted shortness of breath and dizziness at times.  Denied any chest pain or palpitations.  Last saw Coletta Memos, NP  on September 19, 2021 for follow-up evaluation of this and stated the dizziness occurred every time she bent down.  She was pretty active in the gym going about 3 days/week and was try to exercise for 30 minutes.  Did note dyspnea with these activities, and the symptoms were similar to previous anginal symptoms.  Echo ordered and arranged and revealed EF 55 to 27%, grade 2 diastolic dysfunction, mild calcification of aortic valve with trivial AR, no evidence of aortic stenosis, no other significant valvular abnormalities, mildly dilated left and right atria.    Today she presents for follow-up. Says her dizziness and her [facility] has remained the same and have not improved. Says she is going to see a pulmonologist soon for her history of asthma, since her echocardiogram came back overall normal. Has a history of vertigo and has completed PT in the past. Michela Pitcher they wanted to send her to a specialist at [facility], however she did not want to do this. Dizziness is noticed with only bending over. Said she's had history of orthostatics done in the past. Denies any chest pain, shortness of breath is still with exertion, and noticed with walking. Blood pressures are normal at home. Staying adequately hydrated and is wearing compression stockings that have helped improve her lower extremities. Believes her dizziness is associated with chronic history of neck problems. Denies any syncope, presyncope, orthopnea, PND, bleeding, or claudication. Has history of renal cell cancer of right kidney  that she says has recurred, had previously documented in 2010. Denies any other questions or concerns.   Past Medical History:  Diagnosis Date   Abnormal EKG 01/12/2011   Allergy    RHINITIS   Arthritis    neck, back knees, hips   Asthma    Asthma 01/12/2011   Back pain, chronic--plans were for cervical disc surgery week of 01/16/11 01/12/2011   Blood clot in vein 2004   L leg   Blood transfusion without reported diagnosis 2004    Chronic low back pain    Complication of anesthesia    post op bladder retention after surgery   Connective tissue disease ([place])    Coronary artery disease    [place] angina, with EKG changes, new. 01/12/2011   Dyspnea    Fibromyalgia    Fibromyalgia    GERD (gastroesophageal reflux disease)    Headache    migraine - several in a yr.    History of stress test 04/2011   Normal stress test   Hx of echocardiogram 07/19/2011   EF 50-55%. There is mild annular calcification, there is trace migtral regurgitation, there is trace tricupid regurgitation, pericardium is thick, there is no pericardial effusion.   Hypertension    Myocardial infarction [facility]) 2013   triple heart bypass   Neuromuscular disorder (HCC)    fibromyalgia    Osteoporosis    OSTEOPENIA   Personal history of colonic polyp - adenoma 08/05/2013   08/2013 - 7 mm adenoma - consider repeat colonoscopy 2020 (age 27)   Pneumonia 2018   numerous times, only admitted one time for pneumonia but she has had pneumonia numerous times;   02-19-2018 END OF NOVEMBER 2019  WAS ADMITTED FOR ABOUT  A WEEK DUE TO "DOUBLE PNEUMONIA", REPORTS TODAY FEELING BACK TO HER NORMAL , DENIES SOB, O2 SAT AT 16 ROOM AIR , AFEBRILE, OTHER VSS    PONV (postoperative nausea and vomiting)    PPD positive    Status post coronary artery bypass grafting    Urinary incontinence    post op, "sleepy bladder", urinar retention     Past Surgical History:  Procedure Laterality Date   [place] Left 03/27/2019   Benign fibroaipose tissue no evidence of malignancy   BLADDER REPAIR  1985   BREAST SURGERY  1992   REDUCTION, x3 surgeries overall on breast for augmentation    CARDIAC CATHETERIZATION  2013   CARPAL TUNNEL RELEASE Bilateral 1988, [place]   CORONARY ARTERY BYPASS GRAFT  01/14/2011   Procedure: CORONARY ARTERY BYPASS GRAFTING (CABG);  Surgeon: Tharon Aquas Adelene Idler, MD;  Location: [place];  Service: Open Heart Surgery;  Laterality: N/A;  CABG x three, using right greater saphenous vein harvested endoscopically   CORONARY ATHERECTOMY N/A 09/02/2020   Procedure: CORONARY ATHERECTOMY;  Surgeon: Martinique, Peter M, MD;  Location: [place] CV LAB;  Service: Cardiovascular;  Laterality: N/A;   CORONARY STENT INTERVENTION N/A 09/02/2020   Procedure: CORONARY STENT INTERVENTION;  Surgeon: Martinique, Peter M, MD;  Location: [place] [facility];  Service: Cardiovascular;  Laterality: N/A;   EYE SURGERY Bilateral 1985   radiokerototomy   INTRAVASCULAR LITHOTRIPSY  09/02/2020   Procedure: INTRAVASCULAR LITHOTRIPSY;  Surgeon: Martinique, Peter M, MD;  Location: [place] CV LAB;  Service: Cardiovascular;;   INTRAVASCULAR ULTRASOUND/IVUS N/A 09/02/2020   Procedure: Intravascular Ultrasound/IVUS;  Surgeon: Martinique, Peter  M, MD;  Location: [place] [facility];  Service: Cardiovascular;  Laterality: N/A;   LEFT HEART CATH AND CORS/GRAFTS ANGIOGRAPHY N/A 08/31/2020   Procedure: LEFT HEART CATH AND CORS/GRAFTS ANGIOGRAPHY;  Surgeon: Belva Crome, MD;  Location: [place] [facility];  Service: Cardiovascular;  Laterality: N/A;   LEFT HEART CATHETERIZATION WITH CORONARY ANGIOGRAM N/A 01/13/2011   Procedure: LEFT HEART CATHETERIZATION WITH CORONARY ANGIOGRAM;  Surgeon: Troy Sine, MD;  Location: [facility] CATH LAB;  Service: Cardiovascular;  Laterality: N/A;   PUBOVAGINAL SLING  2002   ROBOT ASSISTED LAPAROSCOPIC NEPHRECTOMY Right 02/27/2018   Procedure: XI ROBOTIC ASSISTE PARTIAL NEPHRECTOMY;  Surgeon: Ceasar Mons, MD;  Location: [facility];  Service: Urology;  Laterality: Right;   STERNAL WOUND DEBRIDEMENT  07/27/2011   Procedure: STERNAL WOUND DEBRIDEMENT;  followed by wound vac Surgeon: Ivin Poot, MD;  Location: [place];  Service: Open Heart Surgery;  Laterality: N/A;   TONSILLECTOMY  1952   TOTAL HIP ARTHROPLASTY Right 08/22/2016   Procedure: TOTAL HIP ARTHROPLASTY ANTERIOR  APPROACH;  Surgeon: Melrose Nakayama, MD;  Location: [place];  Service: Orthopedics;  Laterality: Right;   TOTAL KNEE ARTHROPLASTY Right 07/22/2019   Procedure: RIGHT TOTAL KNEE ARTHROPLASTY;  Surgeon: Melrose Nakayama, MD;  Location: [facility];  Service: Orthopedics;  Laterality: Right;   TRIGGER FINGER RELEASE Right 2014   4th finger   TROCHANTERIC BURSA EXCISION Right    bursa removed, R hip   TUBAL LIGATION      Current Medications: Current Meds  Medication Sig   albuterol (PROVENTIL HFA) 108 (90 Base) MCG/ACT inhaler INHALE 2 PUFF AS DIRECTED FOUR TIMES A DAY AS NEEDED   amLODipine (NORVASC) 5 MG tablet Take 1 tablet (5 mg total) by mouth daily.   amoxicillin-clavulanate (AUGMENTIN) 875-125 MG tablet Take 1 tablet by mouth 2 (two) times daily.   aspirin EC 81 MG tablet Take 81 mg by mouth daily. Swallow whole.   atorvastatin (LIPITOR) 20 MG tablet Take 1 tablet (20 mg total) by mouth daily.   Calcium Carb-Cholecalciferol (CALTRATE 600+D3 PO) Take 1 tablet by mouth daily.   carisoprodol (SOMA) 350 MG tablet Take 350 mg by mouth every 8 (eight) hours as needed (for arthritis pain).    clopidogrel (PLAVIX) 75 MG tablet Take 1 tablet (75 mg total) by mouth daily with breakfast.   Evolocumab (REPATHA SURECLICK) 355 MG/ML SOAJ Inject 140 mg into the skin every 14 (fourteen) days.   fluticasone-salmeterol (ADVAIR DISKUS) 500-50 MCG/ACT AEPB Inhale 1 puff into the lungs in the morning and at bedtime.   gabapentin (NEURONTIN) 600 MG tablet Take 1,200-1,800 mg by mouth See admin instructions. Take 2 tablets (1200 mg) by mouth in the morning & take 3 tablets (1800 mg) by mouth at night   HYDROmorphone (DILAUDID) 4 MG tablet Take 1 tablet (4 mg total) by mouth every 4 (four) hours as needed for severe pain.   magnesium oxide (MAG-OX) 400 MG tablet Take 400 mg by mouth at bedtime.   metoprolol tartrate (LOPRESSOR) 25 MG tablet Take 1 tablet (25 mg total) by mouth 2 (two) times daily.   montelukast  (SINGULAIR) 10 MG tablet TAKE ONE TABLET BY MOUTH DAILY   Multiple Vitamins-Minerals (PRESERVISION AREDS 2 PO) Take 1 tablet by mouth in the morning and at bedtime.   nitroGLYCERIN (NITROSTAT) 0.4 MG SL tablet Place 1 tablet (0.4 mg total) under the tongue every 5 (five) minutes x 3 doses as needed for chest pain.   silodosin (RAPAFLO)  8 MG CAPS capsule TAKE ONE CAPSULE BY MOUTH DAILY   VITAMIN D PO Take by mouth.     Allergies:   Dimenhydrinate, Morphine and related, Percocet [oxycodone-acetaminophen], and Zolpidem   Social History   Socioeconomic History   Marital status: Married    Spouse name: Not on file   Number of children: Not on file   Years of education: Not on file   Highest education level: Not on file  Occupational History   Not on file  Tobacco Use   Smoking status: Former    Types: Cigarettes    Quit date: 01/12/1980    Years since quitting: 41.8   Smokeless tobacco: Never  Vaping Use   Vaping Use: Never used  Substance and Sexual Activity   Alcohol use: No    Alcohol/week: 0.0 standard drinks of alcohol   Drug use: No   Sexual activity: Yes  Other Topics Concern   Not on file  Social History Narrative   Right handed   Social Determinants of Health   Financial Resource Strain: Not on file  Food Insecurity: Not on file  Transportation Needs: Not on file  Physical Activity: Not on file  Stress: Not on file  Social Connections: Not on file     Family History: The patient's family history includes Hypertension in her brother. She was adopted.  ROS:   Review of Systems  Constitutional: Negative.   HENT: Negative.    Eyes: Negative.   Respiratory:  Positive for shortness of breath. Negative for cough, hemoptysis, sputum production and wheezing.        With exertion - see HPI.   Cardiovascular: Negative.   Gastrointestinal: Negative.   Genitourinary: Negative.   Musculoskeletal: Negative.   Skin: Negative.   Neurological:  Positive for dizziness.  Negative for tingling, tremors, sensory change, speech change, focal weakness, seizures, loss of consciousness, weakness and headaches.       See HPI.   Endo/Heme/Allergies: Negative.   Psychiatric/Behavioral: Negative.      Please see the history of present illness.    All other systems reviewed and are negative.  EKGs/Labs/Other Studies Reviewed:    The following studies were reviewed today:   EKG:  EKG is not ordered today.   Coronary stent intervention on 09/02/2020:  Prox RCA lesion is 80% stenosed.   A drug-eluting stent was successfully placed using a SYNERGY XD 3.0X24.   Post intervention, there is a 0% residual stenosis. Successful PCI of the proximal RCA with IVUS guidance, orbital atherectomy, Shockwave lithotripsy and DES x 1 Plan: DAPT for one year. If stable anticipate DC in am.  Echo on 09/01/2020: 1. Left ventricular ejection fraction, by estimation, is 55 to 60%. The  left ventricle has normal function. The left ventricle has no regional  wall motion abnormalities. There is mild concentric left ventricular  hypertrophy. Left ventricular diastolic  parameters are indeterminate.   2. Right ventricular systolic function is low normal. The right  ventricular size is normal.   3. The mitral valve is grossly normal. Trivial mitral valve  regurgitation. No evidence of mitral stenosis.   4. The aortic valve has an indeterminant number of cusps. Aortic valve  regurgitation is not visualized. No aortic stenosis is present.   5. The inferior vena cava is normal in size with greater than 50%  respiratory variability, suggesting right atrial pressure of 3 mmHg.   Comparison(s): No significant change from prior study.   Left Heart Cath on 08/31/2020:  Total occlusion of SVG to RCA-PL.   Patent LIMA to LAD.   Patent SVG to OM.   85% heavily calcified distal left main.   85 to 90% proximal LAD before the first diagonal, followed by 100% occlusion in the mid vessel.   60  to 70% ostial to proximal circumflex.  Competitive flow was noted in the circumflex due to the widely patent saphenous vein graft.   Right coronary is dominant, heavily calcified, and tortuous.  In the proximal vessel within a right angle bend there is 50 to 70% obstruction with eccentric calcification.  The mid RCA contains focal eccentric 60 to 70% stenosis.   LV function is normal.  EF is 60%.  LVEDP is 12 mmHg.   Plan: Medical therapy If recurring symptoms compatible with angina, consider therapy of the right coronary proximal and mid segment.  This would require plaque modification to get full stent expansion.  Would likely be difficult to perform due to tortuosity.  May be more appropriately managed from a femoral approach.  Discussed with Dr. Ellyn Hack.  Myoview on 12/28/2015: The left ventricular ejection fraction is normal (55-65%). Nuclear stress EF: 57%. Blood pressure demonstrated a normal response to exercise. There was no ST segment deviation noted during stress. The study is normal. This is a low risk study.  Recent Labs: 12/16/2020: ALT 13 09/19/2021: BUN 26; Creatinine, Ser 1.11; Hemoglobin 13.0; Platelets 195; Potassium 5.2; Sodium 141  Recent Lipid Panel    Component Value Date/Time   CHOL 122 12/16/2020 0933   TRIG 51 12/16/2020 0933   HDL 83 12/16/2020 0933   CHOLHDL 1.5 12/16/2020 0933   CHOLHDL 2.0 09/01/2020 0241   VLDL 11 09/01/2020 0241   LDLCALC 27 12/16/2020 0933    Physical Exam:    VS:  BP 122/74   Pulse 68   Ht 5' 4.5" (1.638 m)   Wt 187 lb 6.4 oz (85 kg)   SpO2 92%   BMI 31.67 kg/m     Wt Readings from Last 3 Encounters:  10/27/21 187 lb 6.4 oz (85 kg)  10/17/21 182 lb 9.6 oz (82.8 kg)  10/11/21 181 lb 9.6 oz (82.4 kg)     GEN: Well nourished, well developed in no acute distress HEENT: Normal NECK: No JVD; diminished right carotid bruit noted, no carotid bruit on left  CARDIAC: S1/S2, RRR, no murmurs, rubs, gallops; 2+ peripheral pulses  throughout, strong and equal bilaterally RESPIRATORY:  Clear and diminished to auscultation without rales, wheezing or rhonchi  MUSCULOSKELETAL:  No edema; No deformity  SKIN: Warm and dry NEUROLOGIC:  Alert and oriented x 3 PSYCHIATRIC:  Normal affect   ASSESSMENT:    1. Coronary artery disease involving native heart without angina pectoris, unspecified vessel or lesion type   2. Hyperlipidemia, unspecified hyperlipidemia type   3. DOE (dyspnea on exertion)   4. Dizziness   5. Bruit of right carotid artery   6. Essential hypertension    PLAN:    In order of problems listed above:  Coronary artery disease, s/p CABG x 3 in 2013.  Cardiac cath was performed 08/2020 and revealed a high-grade distal left main disease with occluded LAD after her first diagonal branch and patent SVG-OM, LIMA-LAD, occluded SVG-RCA, and high-grade calcified proximal RCA.  She also underwent complex IVUS guided orbital arthrectomy with shockwave angioplasty and received DES to her RCA by Dr. Martinique in September 2022. No episodes of chest pain. S/p CABG x 3 in 2013.  Continue aspirin, Lipitor,  Plavix, Repatha, Lopressor, nitroglycerin as needed. Heart healthy diet and regular cardiovascular exercise encouraged.   2. HLD Most recent lipid panel was drawn December 2022.  LDL was 27, HDL 83, total cholesterol 122, triglycerides 51.  Plan to obtain fasting lipid panel and liver function tests at next office visit. Heart healthy diet and regular cardiovascular exercise encouraged.  Continue Lipitor and Repatha.  2. Dyspnea on exertion Recent echo revealed EF 55 to 50%, grade 2 diastolic dysfunction. Euvolemic and well compensated on exam. Less likely cardiac in nature. Going to see a pulmonologist for an upcoming appointment to evaluate her asthma.  Continue to follow-up with pulmonology and PCP.  If at next follow-up visit her breathing does not improve after seeing the pulmonologist, consider possible ischemic  evaluation to determine any other cardiac cause. Continue current medication regimen. ED precautions discussed.   4. Dizziness, right carotid bruit Etiology unclear.  Patient thinks it may be related to some issues in her neck.  Negative orthostatics today in office. Did note diminished right carotid bruit on exam, no carotid bruit noted on left carotid, will arrange carotid doppler.  Discussed conservative measures including continue her compression stockings, changing positions slowly, and staying adequately hydrated.  If carotid doppler comes back normal, and dizziness does not improve, consider referral to neurology.  We will obtain TSH, magnesium, and BMET.  5. HTN Blood pressure today stable, 122/74.  BP overall well controlled at home.  Continue amlodipine and Lopressor. Heart healthy diet and regular cardiovascular exercise encouraged.   6. Disposition: Follow-up with APP in 6 to 8 weeks or sooner if anything changes.   Medication Adjustments/Labs and Tests Ordered: Current medicines are reviewed at length with the patient today.  Concerns regarding medicines are outlined above.  Orders Placed This Encounter  Procedures   Magnesium   TSH   Basic metabolic panel   VAS US CAROTID   No orders of the defined types were placed in this encounter.   Patient Instructions  Medication Instructions:  The current medical regimen is effective;  continue present plan and medications as directed. Please refer to the Current Medication list given to you today.  *If you need a refill on your cardiac medications before your next appointment, please call your pharmacy*   Lab Work: BMET, MAG AND TSH TODAY If you have labs (blood work) drawn today and your tests are completely normal, you will receive your results only by: [place] (if you have MyChart) OR A paper copy in the mail If you have any lab test that is abnormal or we need to change your treatment, we will call you to review the  results.   Testing/Procedures: Your physician has requested that you have a carotid duplex. This test is an ultrasound of the carotid arteries in your neck. It looks at blood flow through these arteries that supply the brain with blood. Allow one hour for this exam. There are no restrictions or special instructions.  Follow-Up: At [facility], you and your health needs are our priority.  As part of our continuing mission to provide you with exceptional heart care, we have created designated Provider Care Teams.  These Care Teams include your primary Cardiologist (physician) and Advanced Practice Providers (APPs -  Physician Assistants and Nurse Practitioners) who all work together to provide you with the care you need, when you need it.  We recommend signing up for the patient portal called "MyChart".  Sign up information is provided on this  After Visit Summary.  MyChart is used to connect with patients for Virtual Visits (Telemedicine).  Patients are able to view lab/test results, encounter notes, upcoming appointments, etc.  Non-urgent messages can be sent to your provider as well.   To learn more about what you can do with MyChart, go to [email or web address].    Your next appointment:   6-8 week(s)  The format for your next appointment:   In Person  Provider:   Coletta Memos, FNP-C  Other Instructions   Important Information About Sugar         Signed, Finis Bud, NP  10/27/2021 8:21 PM    [place]

## 2021-10-27 ENCOUNTER — Encounter: Payer: Self-pay | Admitting: General Practice

## 2021-10-27 ENCOUNTER — Ambulatory Visit: Payer: PPO | Attending: General Practice | Admitting: Nurse Practitioner

## 2021-10-27 VITALS — BP 122/74 | HR 68 | Ht 64.5 in | Wt 187.4 lb

## 2021-10-27 DIAGNOSIS — R42 Dizziness and giddiness: Secondary | ICD-10-CM | POA: Insufficient documentation

## 2021-10-27 DIAGNOSIS — R0989 Other specified symptoms and signs involving the circulatory and respiratory systems: Secondary | ICD-10-CM | POA: Diagnosis not present

## 2021-10-27 DIAGNOSIS — I10 Essential (primary) hypertension: Secondary | ICD-10-CM

## 2021-10-27 DIAGNOSIS — R0609 Other forms of dyspnea: Secondary | ICD-10-CM

## 2021-10-27 DIAGNOSIS — I2510 Atherosclerotic heart disease of native coronary artery without angina pectoris: Secondary | ICD-10-CM

## 2021-10-27 DIAGNOSIS — E785 Hyperlipidemia, unspecified: Secondary | ICD-10-CM

## 2021-10-27 NOTE — Patient Instructions (Signed)
Medication Instructions:  The current medical regimen is effective;  continue present plan and medications as directed. Please refer to the Current Medication list given to you today.  *If you need a refill on your cardiac medications before your next appointment, please call your pharmacy*   Lab Work: BMET, MAG AND TSH TODAY If you have labs (blood work) drawn today and your tests are completely normal, you will receive your results only by: [place] (if you have MyChart) OR A paper copy in the mail If you have any lab test that is abnormal or we need to change your treatment, we will call you to review the results.   Testing/Procedures: Your physician has requested that you have a carotid duplex. This test is an ultrasound of the carotid arteries in your neck. It looks at blood flow through these arteries that supply the brain with blood. Allow one hour for this exam. There are no restrictions or special instructions.  Follow-Up: At [facility], you and your health needs are our priority.  As part of our continuing mission to provide you with exceptional heart care, we have created designated Provider Care Teams.  These Care Teams include your primary Cardiologist (physician) and Advanced Practice Providers (APPs -  Physician Assistants and Nurse Practitioners) who all work together to provide you with the care you need, when you need it.  We recommend signing up for the patient portal called "MyChart".  Sign up information is provided on this After Visit Summary.  MyChart is used to connect with patients for Virtual Visits (Telemedicine).  Patients are able to view lab/test results, encounter notes, upcoming appointments, etc.  Non-urgent messages can be sent to your provider as well.   To learn more about what you can do with MyChart, go to [email or web address].    Your next appointment:   6-8 week(s)  The format for your next appointment:   In Person  Provider:    Coletta Memos, FNP-C  Other Instructions   Important Information About Sugar

## 2021-10-28 LAB — BASIC METABOLIC PANEL
BUN/Creatinine Ratio: 24 (ref 12–28)
BUN: 22 mg/dL (ref 8–27)
CO2: 21 mmol/L (ref 20–29)
Calcium: 9.3 mg/dL (ref 8.7–10.3)
Chloride: 104 mmol/L (ref 96–106)
Creatinine, Ser: 0.93 mg/dL (ref 0.57–1.00)
Glucose: 106 mg/dL — ABNORMAL HIGH (ref 70–99)
Potassium: 4.8 mmol/L (ref 3.5–5.2)
Sodium: 144 mmol/L (ref 134–144)
eGFR: 62 mL/min/{1.73_m2} (ref >59)

## 2021-10-28 LAB — TSH: TSH: 0.89 u[IU]/mL (ref 0.450–4.500)

## 2021-10-28 LAB — MAGNESIUM: Magnesium: 2.3 mg/dL (ref 1.6–2.3)

## 2021-11-04 ENCOUNTER — Institutional Professional Consult (permissible substitution): Payer: PPO | Admitting: Internal Medicine

## 2021-11-07 ENCOUNTER — Ambulatory Visit (HOSPITAL_COMMUNITY)
Admission: RE | Admit: 2021-11-07 | Discharge: 2021-11-07 | Disposition: A | Discharge status: Home / Self Care | Payer: PPO | Source: Ambulatory Visit | Attending: Nurse Practitioner | Admitting: Nurse Practitioner

## 2021-11-07 DIAGNOSIS — R0989 Other specified symptoms and signs involving the circulatory and respiratory systems: Secondary | ICD-10-CM

## 2021-11-07 DIAGNOSIS — R42 Dizziness and giddiness: Secondary | ICD-10-CM

## 2021-11-11 ENCOUNTER — Other Ambulatory Visit: Payer: Self-pay | Admitting: Family Medicine

## 2021-11-11 DIAGNOSIS — J4530 Mild persistent asthma, uncomplicated: Secondary | ICD-10-CM

## 2021-11-15 ENCOUNTER — Other Ambulatory Visit: Payer: Self-pay

## 2021-11-15 DIAGNOSIS — R42 Dizziness and giddiness: Secondary | ICD-10-CM

## 2021-11-17 NOTE — Progress Notes (Signed)
NEUROLOGY FOLLOW UP OFFICE NOTE  [name] Long [id number]  Assessment/Plan:   Cervicogenic dizziness.  I do not suspect central etiology or migraine. Cervical spondylosis with chronic neck pain s/p fusion  1 Recommend continuing management for cervical spondylosis and chronic neck pain.  She would rather avoid adding other medication.  No further recommendations.  Subjective:  [name] Long is an 81 year old female with asthma, arthritis, CAD, connective tissue disease, chronic pain syndrome and history of renal cell cancer whom I previously saw for cervicogenic headache presents today for dizziness.  History supplemented by referring provider's note.  She has longstanding history of dizziness described as a lightheadedness.  No actual spinning but she feels it may progress to that.  Aggravated when turning her neck or bending over.  Orthostatic vitals previously negative.  She was diagnosed with bilateral BPPV.  Underwent physical therapy in the past which resolved the right but not the left side.  When she turns her neck to the left or lays on the left side, she gets dizzy.  Duration is brief.  No double vision.  It was suggested to go to the vestibular clinic at [facility] but patient declined.  Also with dyspnea.  She followed up with cardiology.  Echo on 10/07/2021 revealed LVEF 55-60%.  She was noted to have right carotid bruit.  TSH was 0.890.  Carotid ultrasound on 11/09/2021 revealed no hemodynamically significant stenosis.  She does have chronic neck pain with cervical spondylosis and stenosis status post anterior fusion at C4-5, C5-6 and C6-7.  She is followed by pain management.  Carotid ultrasound on 11/07/2021 revealed no hemodynamically significant stenosis.    PAST MEDICAL HISTORY: Past Medical History:  Diagnosis Date   Abnormal EKG 01/12/2011   Allergy    RHINITIS   Arthritis    neck, back knees, hips   Asthma    Asthma 01/12/2011   Back pain, chronic--plans were for cervical  disc surgery week of 01/16/11 01/12/2011   Blood clot in vein 2004   L leg   Blood transfusion without reported diagnosis 2004   Chronic low back pain    Complication of anesthesia    post op bladder retention after surgery   Connective tissue disease ([place])    Coronary artery disease    s/p CABG x 3 in 2013, hx of PCI with DES   Crescendo angina, with EKG changes, new. 01/12/2011   Dyspnea    Fibromyalgia    Fibromyalgia    GERD (gastroesophageal reflux disease)    Headache    migraine - several in a yr.    History of stress test 04/2011   Normal stress test   Hx of echocardiogram 07/19/2011   EF 50-55%. There is mild annular calcification, there is trace migtral regurgitation, there is trace tricupid regurgitation, pericardium is thick, there is no pericardial effusion.   Hyperlipidemia    Hypertension    Myocardial infarction [facility]) 2013   triple heart bypass   Neuromuscular disorder (HCC)    fibromyalgia    Osteoporosis    OSTEOPENIA   Personal history of colonic polyp - adenoma 08/05/2013   08/2013 - 7 mm adenoma - consider repeat colonoscopy 2020 (age 75)   Pneumonia 2018   numerous times, only admitted one time for pneumonia but she has had pneumonia numerous times;   02-19-2018 END OF NOVEMBER 2019  WAS ADMITTED FOR ABOUT  A WEEK DUE TO "DOUBLE PNEUMONIA", REPORTS TODAY FEELING BACK TO HER NORMAL ,  DENIES SOB, O2 SAT AT 94 ROOM AIR , AFEBRILE, OTHER VSS    PONV (postoperative nausea and vomiting)    PPD positive    Right carotid bruit    Status post coronary artery bypass grafting    Urinary incontinence    post op, "sleepy bladder", urinar retention     MEDICATIONS: Current Outpatient Medications on File Prior to Visit  Medication Sig Dispense Refill   albuterol (PROVENTIL HFA) 108 (90 Base) MCG/ACT inhaler INHALE 2 PUFF AS DIRECTED FOUR TIMES A DAY AS NEEDED 6.7 g 1   amLODipine (NORVASC) 5 MG tablet Take 1 tablet (5 mg total) by mouth daily. 90 tablet 3    amoxicillin-clavulanate (AUGMENTIN) 875-125 MG tablet Take 1 tablet by mouth 2 (two) times daily. 20 tablet 0   aspirin EC 81 MG tablet Take 81 mg by mouth daily. Swallow whole.     atorvastatin (LIPITOR) 20 MG tablet Take 1 tablet (20 mg total) by mouth daily. 90 tablet 3   Calcium Carb-Cholecalciferol (CALTRATE 600+D3 PO) Take 1 tablet by mouth daily.     carisoprodol (SOMA) 350 MG tablet Take 350 mg by mouth every 8 (eight) hours as needed (for arthritis pain).      clopidogrel (PLAVIX) 75 MG tablet Take 1 tablet (75 mg total) by mouth daily with breakfast. 90 tablet 3   Evolocumab (REPATHA SURECLICK) 619 MG/ML SOAJ Inject 140 mg into the skin every 14 (fourteen) days. 2 mL 11   fluticasone-salmeterol (ADVAIR DISKUS) 500-50 MCG/ACT AEPB Inhale 1 puff into the lungs in the morning and at bedtime. 180 each 3   gabapentin (NEURONTIN) 600 MG tablet Take 1,200-1,800 mg by mouth See admin instructions. Take 2 tablets (1200 mg) by mouth in the morning & take 3 tablets (1800 mg) by mouth at night     HYDROmorphone (DILAUDID) 4 MG tablet Take 1 tablet (4 mg total) by mouth every 4 (four) hours as needed for severe pain. 10 tablet 0   magnesium oxide (MAG-OX) 400 MG tablet Take 400 mg by mouth at bedtime.     metoprolol tartrate (LOPRESSOR) 25 MG tablet Take 1 tablet (25 mg total) by mouth 2 (two) times daily. 180 tablet 3   montelukast (SINGULAIR) 10 MG tablet TAKE 1 TABLET BY MOUTH DAILY 90 tablet 0   Multiple Vitamins-Minerals (PRESERVISION AREDS 2 PO) Take 1 tablet by mouth in the morning and at bedtime.     nitroGLYCERIN (NITROSTAT) 0.4 MG SL tablet Place 1 tablet (0.4 mg total) under the tongue every 5 (five) minutes x 3 doses as needed for chest pain. 25 tablet 1   silodosin (RAPAFLO) 8 MG CAPS capsule TAKE ONE CAPSULE BY MOUTH DAILY 30 capsule 3   VITAMIN D PO Take by mouth.     No current facility-administered medications on file prior to visit.    ALLERGIES: Allergies  Allergen Reactions    Dimenhydrinate Other (See Comments)    "locks up kidneys"   Morphine And Related Nausea And Vomiting   Percocet [Oxycodone-Acetaminophen] Other (See Comments)    hallucinations   Zolpidem Other (See Comments)    Unusual behavior    FAMILY HISTORY: Family History  Adopted: Yes  Problem Relation Age of Onset   Hypertension Brother       Objective:  Blood pressure 128/85, pulse 95, height '5\' 4"'$  (1.626 m), weight 182 lb 6.4 oz (82.7 kg), SpO2 96 %. General: No acute distress.  Patient appears well-groomed.   Head:  Normocephalic/atraumatic Eyes:  Fundi examined but not visualized Neck: supple, bilateral paraspinal tenderness, full range of motion Heart:  Regular rate and rhythm Neurological Exam: alert and oriented to person, place, and time.  Speech fluent and not dysarthric, language intact.  CN II-XII intact. Bulk and tone normal, muscle strength 5/5 throughout.  Sensation to light touch intact.  Deep tendon reflexes 2+ throughout, toes downgoing.  Finger to nose testing intact.  Gait normal, Romberg with sway   Metta Clines, DO  CC:  Finis Bud, NP  Jill Alexanders, MD

## 2021-11-21 ENCOUNTER — Ambulatory Visit: Payer: PPO | Admitting: Neurology

## 2021-11-21 ENCOUNTER — Encounter: Payer: Self-pay | Admitting: Neurology

## 2021-11-21 VITALS — BP 128/85 | HR 95 | Ht 64.0 in | Wt 182.4 lb

## 2021-11-21 DIAGNOSIS — R42 Dizziness and giddiness: Secondary | ICD-10-CM

## 2021-11-21 DIAGNOSIS — G4486 Cervicogenic headache: Secondary | ICD-10-CM

## 2021-11-21 DIAGNOSIS — M47812 Spondylosis without myelopathy or radiculopathy, cervical region: Secondary | ICD-10-CM | POA: Diagnosis not present

## 2021-11-28 NOTE — Progress Notes (Unsigned)
Cardiology Clinic Note   Patient Name: DESIREY KEAHEY Date of Encounter: 12/06/2021  Primary Care Provider:  Denita Lung, MD Primary Cardiologist:  Quay Burow, MD  Patient Profile    Chauncey Cruel Darin 81 year old female presents the clinic today for evaluation of her shortness of breath and dizziness.  Past Medical History    Past Medical History:  Diagnosis Date   Abnormal EKG 01/12/2011   Allergy    RHINITIS   Arthritis    neck, back knees, hips   Asthma    Asthma 01/12/2011   Back pain, chronic--plans were for cervical disc surgery week of 01/16/11 01/12/2011   Blood clot in vein 2004   L leg   Blood transfusion without reported diagnosis 2004   Chronic low back pain    Complication of anesthesia    post op bladder retention after surgery   Connective tissue disease ([place])    Coronary artery disease    s/p CABG x 3 in 2013, hx of PCI with DES   Crescendo angina, with EKG changes, new. 01/12/2011   Dyspnea    Fibromyalgia    Fibromyalgia    GERD (gastroesophageal reflux disease)    Headache    migraine - several in a yr.    History of stress test 04/2011   Normal stress test   Hx of echocardiogram 07/19/2011   EF 50-55%. There is mild annular calcification, there is trace migtral regurgitation, there is trace tricupid regurgitation, pericardium is thick, there is no pericardial effusion.   Hyperlipidemia    Hypertension    Myocardial infarction [facility]) 2013   triple heart bypass   Neuromuscular disorder (HCC)    fibromyalgia    Osteoporosis    OSTEOPENIA   Personal history of colonic polyp - adenoma 08/05/2013   08/2013 - 7 mm adenoma - consider repeat colonoscopy 2020 (age 19)   Pneumonia 2018   numerous times, only admitted one time for pneumonia but she has had pneumonia numerous times;   02-19-2018 END OF NOVEMBER 2019  WAS ADMITTED FOR ABOUT  A WEEK DUE TO "DOUBLE PNEUMONIA", REPORTS TODAY FEELING BACK TO HER NORMAL , DENIES SOB, O2 SAT AT 71 ROOM AIR ,  AFEBRILE, OTHER VSS    PONV (postoperative nausea and vomiting)    PPD positive    Right carotid bruit    Status post coronary artery bypass grafting    Urinary incontinence    post op, "sleepy bladder", urinar retention    Past Surgical History:  Procedure Laterality Date   Ebro Left 03/27/2019   Benign fibroaipose tissue no evidence of malignancy   BLADDER REPAIR  1985   BREAST SURGERY  1992   REDUCTION, x3 surgeries overall on breast for augmentation    CARDIAC CATHETERIZATION  2013   CARPAL TUNNEL RELEASE Bilateral 1988, [place]   CORONARY ARTERY BYPASS GRAFT  01/14/2011   Procedure: CORONARY ARTERY BYPASS GRAFTING (CABG);  Surgeon: Tharon Aquas Adelene Idler, MD;  Location: [place];  Service: Open Heart Surgery;  Laterality: N/A;  CABG x three, using right greater saphenous vein harvested endoscopically   CORONARY ATHERECTOMY N/A 09/02/2020   Procedure: CORONARY ATHERECTOMY;  Surgeon: Martinique, Peter M, MD;  Location: [place] [facility];  Service: Cardiovascular;  Laterality: N/A;   CORONARY STENT INTERVENTION N/A 09/02/2020   Procedure: CORONARY STENT INTERVENTION;  Surgeon: Martinique,  Ander Slade, MD;  Location: [place] [facility];  Service: Cardiovascular;  Laterality: N/A;   EYE SURGERY Bilateral 1985   radiokerototomy   INTRAVASCULAR LITHOTRIPSY  09/02/2020   Procedure: INTRAVASCULAR LITHOTRIPSY;  Surgeon: Martinique, Peter M, MD;  Location: [place] [facility];  Service: Cardiovascular;;   INTRAVASCULAR ULTRASOUND/IVUS N/A 09/02/2020   Procedure: Intravascular Ultrasound/IVUS;  Surgeon: Martinique, Peter M, MD;  Location: [place] [facility];  Service: Cardiovascular;  Laterality: N/A;   LEFT HEART CATH AND CORS/GRAFTS ANGIOGRAPHY N/A 08/31/2020   Procedure: LEFT HEART CATH AND CORS/GRAFTS ANGIOGRAPHY;  Surgeon: Belva Crome, MD;  Location: [place] [facility];  Service: Cardiovascular;  Laterality: N/A;   LEFT  HEART CATHETERIZATION WITH CORONARY ANGIOGRAM N/A 01/13/2011   Procedure: LEFT HEART CATHETERIZATION WITH CORONARY ANGIOGRAM;  Surgeon: Troy Sine, MD;  Location: [facility] CATH LAB;  Service: Cardiovascular;  Laterality: N/A;   PUBOVAGINAL SLING  2002   ROBOT ASSISTED LAPAROSCOPIC NEPHRECTOMY Right 02/27/2018   Procedure: XI ROBOTIC ASSISTE PARTIAL NEPHRECTOMY;  Surgeon: Ceasar Mons, MD;  Location: [facility];  Service: Urology;  Laterality: Right;   STERNAL WOUND DEBRIDEMENT  07/27/2011   Procedure: STERNAL WOUND DEBRIDEMENT;  followed by wound vac Surgeon: Ivin Poot, MD;  Location: [place];  Service: Open Heart Surgery;  Laterality: N/A;   TONSILLECTOMY  1952   TOTAL HIP ARTHROPLASTY Right 08/22/2016   Procedure: TOTAL HIP ARTHROPLASTY ANTERIOR APPROACH;  Surgeon: Melrose Nakayama, MD;  Location: [place];  Service: Orthopedics;  Laterality: Right;   TOTAL KNEE ARTHROPLASTY Right 07/22/2019   Procedure: RIGHT TOTAL KNEE ARTHROPLASTY;  Surgeon: Melrose Nakayama, MD;  Location: [facility];  Service: Orthopedics;  Laterality: Right;   TRIGGER FINGER RELEASE Right 2014   4th finger   TROCHANTERIC BURSA EXCISION Right    bursa removed, R hip   TUBAL LIGATION      Allergies  Allergies  Allergen Reactions   Dimenhydrinate Other (See Comments)    "locks up kidneys"   Morphine And Related Nausea And Vomiting   Percocet [Oxycodone-Acetaminophen] Other (See Comments)    hallucinations   Zolpidem Other (See Comments)    Unusual behavior    History of Present Illness    SELMA MINK has a PMH of coronary artery disease, essential hypertension, allergic rhinitis, GERD, osteopenia, fibromyalgia, coronary artery disease status post CABG x3 in 2013.  She was seen by Cecilie Kicks 7/15.  During that time she was concerned about chest discomfort.  It was felt that she may have musculoskeletal pain versus pericarditis.  Echocardiogram was normal.  She was seen in follow-up by Dr. 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  She required hospitalization and debridement.  She had a wound VAC placed in her wound completely healed.  She underwent nuclear stress testing which showed a normal result and also had normal echocardiogram.  It was felt that her increase in shortness of breath was related to exacerbation of her asthma at that time.  She was admitted 08/31/2020 with chest pain and shortness of breath.  She underwent cardiac catheterization by Dr. Tamala Julian.  She was noted to have high-grade distal left main disease with occluded LAD after her first diagonal branch and patent LIMA-LAD, patent SVG-OM, occluded SVG-RCA and high-grade calcified proximal RCA.  She underwent complex IVUS guided orbital arthrectomy with shockwave angioplasty and received DES of her RCA by Dr. Martinique on 09/02/2020.  She was discharged the following day.  She felt  that her shortness of breath had somewhat improved.  She contacted the nurse triage line on 09/14/2021.  During that time she reported a blood pressure of 138/75 and a pulse of 88.  She noted that her legs and arm felt heavy when she would walk.  She noted no palpitations or chest pain.  She did report shortness of breath and dizziness at times.  She presented to the clinic 09/19/2021 for follow-up evaluation and stated over the last couple months she had noticed increased dyspnea on exertion and dizziness.  She noticed dizziness each time she bent down.  She had been trying to stay physically active but was somewhat limited due to her back pain.  She had been going to the gym 3 days/week and had been trying to exercise for 30 minutes.  She reported increased dyspnea with these activities.  She did not feel that these symptoms were similar to her previous anginal symptoms.  We reviewed her previous cardiac catheterization.  I  asked her to increase her p.o. hydration, may drink sports drink,  ordered an echocardiogram, ordered CBC BMP and planned follow-up  in 1-2 months.  Follow-up echocardiogram showed an EF of 55-60%, G2 DD, mildly dilated left and right atria, and no significant valvular abnormalities.  She was seen by Finis Bud, NP-C on 10/27/2021.  During that time she reported that her shortness of breath and dizziness had not improved.  She had a visit scheduled to see pulmonology.  Her echocardiogram was reviewed.  She expressed understanding.  She noted that she had previously completed physical therapy for vertigo.  Her PCP had wanted to send her to a specialist at [facility] which she did not want to do.  She denied chest pain and increased.  Her blood pressure was well-controlled.  She denied presyncope and syncope.  She was not noted to have lower extremity edema.  She was noted to have a right carotid bruit.  Carotid Dopplers were ordered and showed minimal 1-39% bilateral stenosis.  Repeat Doppler study was recommended for 1 year.  She presents to the clinic today for follow-up evaluation states her legs feel like they have much more energy with the use of lower extremity support stockings.  She continues to be dizzy.  She is following with neurology and is planning spine surgery.  She continues to walk with a cane.  We reviewed her echocardiogram and carotid Dopplers.  She expressed understanding.  I explained that it does not appear that her dizziness is related to cardiac issues.  I will provide her with Epley maneuvers, have her follow-up with Dr. Gwenlyn Found as scheduled, and continue her current physical activity and diet.  We will request lab work from her PCP (lipids LFTs)  Today she denies chest pain, shortness of breath, lower extremity edema, fatigue, palpitations, melena, hematuria, hemoptysis, diaphoresis, weakness, presyncope, syncope, orthopnea, and PND.    Home Medications    Prior to Admission medications   Medication Sig Start Date End Date Taking? Authorizing Provider  albuterol (PROVENTIL HFA) 108 (90 Base) MCG/ACT inhaler  INHALE 2 PUFF AS DIRECTED FOUR TIMES A DAY AS NEEDED 12/16/20   Denita Lung, MD  amLODipine (NORVASC) 5 MG tablet Take 1 tablet (5 mg total) by mouth daily. 10/04/20   Ledora Bottcher, PA  aspirin EC 81 MG tablet Take 81 mg by mouth daily. Swallow whole.    [provider]  atorvastatin (LIPITOR) 20 MG tablet Take 1 tablet (20 mg total) by mouth daily.  10/04/20 01/02/21  [facility], Tami Lin, PA  Calcium Carb-Cholecalciferol (CALTRATE 600+D3 PO) Take 1 tablet by mouth daily.    [provider]  carisoprodol (SOMA) 350 MG tablet Take 350 mg by mouth every 8 (eight) hours as needed (for arthritis pain).     [provider]  clopidogrel (PLAVIX) 75 MG tablet Take 1 tablet (75 mg total) by mouth daily with breakfast. 11/18/20   [facility], Tami Lin, PA  Evolocumab (REPATHA SURECLICK) 664 MG/ML SOAJ Inject 140 mg into the skin every 14 (fourteen) days. 02/23/21   Lorretta Harp, MD  fluticasone-salmeterol (ADVAIR DISKUS) 500-50 MCG/ACT AEPB Inhale 1 puff into the lungs in the morning and at bedtime. 12/16/20   Denita Lung, MD  gabapentin (NEURONTIN) 600 MG tablet Take 1,200-1,800 mg by mouth See admin instructions. Take 2 tablets (1200 mg) by mouth in the morning & take 3 tablets (1800 mg) by mouth at night    [provider]  HYDROmorphone (DILAUDID) 4 MG tablet Take 1 tablet (4 mg total) by mouth every 4 (four) hours as needed for severe pain. 02/27/18   Debbrah Alar, PA-C  magnesium oxide (MAG-OX) 400 MG tablet Take 400 mg by mouth at bedtime.    [provider]  metoprolol tartrate (LOPRESSOR) 25 MG tablet Take 1 tablet (25 mg total) by mouth 2 (two) times daily. 08/31/21   Lorretta Harp, MD  montelukast (SINGULAIR) 10 MG tablet TAKE ONE TABLET BY MOUTH DAILY 08/04/21   Denita Lung, MD  Multiple Vitamins-Minerals (PRESERVISION AREDS 2 PO) Take 1 tablet by mouth in the morning and at bedtime.    [provider]  nitroGLYCERIN (NITROSTAT)  0.4 MG SL tablet Place 1 tablet (0.4 mg total) under the tongue every 5 (five) minutes x 3 doses as needed for chest pain. 07/19/21   Lorretta Harp, MD  silodosin (RAPAFLO) 8 MG CAPS capsule TAKE ONE CAPSULE BY MOUTH DAILY 08/17/21   Denita Lung, MD    Family History    Family History  Adopted: Yes  Problem Relation Age of Onset   Hypertension Brother    is adopted.   Social History    Social History   Socioeconomic History   Marital status: Married    Spouse name: Not on file   Number of children: Not on file   Years of education: Not on file   Highest education level: Not on file  Occupational History   Not on file  Tobacco Use   Smoking status: Former    Types: Cigarettes    Quit date: 01/12/1980    Years since quitting: 41.9   Smokeless tobacco: Never  Vaping Use   Vaping Use: Never used  Substance and Sexual Activity   Alcohol use: No    Alcohol/week: 0.0 standard drinks of alcohol   Drug use: No   Sexual activity: Yes  Other Topics Concern   Not on file  Social History Narrative   Right handed   Social Determinants of Health   Financial Resource Strain: Not on file  Food Insecurity: Not on file  Transportation Needs: Not on file  Physical Activity: Not on file  Stress: Not on file  Social Connections: Not on file  Intimate Partner Violence: Not on file     Review of Systems    General:  No chills, fever, night sweats or weight changes.  Cardiovascular:  No chest pain, dyspnea on exertion, edema, orthopnea, palpitations, paroxysmal nocturnal dyspnea. Dermatological: No rash,  lesions/masses Respiratory: No cough, dyspnea Urologic: No hematuria, dysuria Abdominal:   No nausea, vomiting, diarrhea, bright red blood per rectum, melena, or hematemesis Neurologic:  No visual changes, wkns, changes in mental status. All other systems reviewed and are otherwise negative except as noted above.  Physical Exam    VS:  BP 116/70 (BP Location: Left Arm,  Patient Position: Sitting, Cuff Size: Normal)   Pulse 95   Ht '5\' 4"'$  (1.626 m)   Wt 182 lb 12.8 oz (82.9 kg)   SpO2 97%   BMI 31.38 kg/m  , BMI Body mass index is 31.38 kg/m. GEN: Well nourished, well developed, in no acute distress. HEENT: normal. Neck: Supple, no JVD, carotid bruits, or masses. Cardiac: RRR, no murmurs, rubs, or gallops. No clubbing, cyanosis, edema.  Radials/DP/PT 2+ and equal bilaterally.  Respiratory:  Respirations regular and unlabored, clear to auscultation bilaterally. GI: Soft, nontender, nondistended, BS + x 4. MS: no deformity or atrophy. Skin: warm and dry, no rash. Neuro:  Strength and sensation are intact. Psych: Normal affect.  Accessory Clinical Findings    Recent Labs: 12/16/2020: ALT 13 09/19/2021: Hemoglobin 13.0; Platelets 195 10/27/2021: BUN 22; Creatinine, Ser 0.93; Magnesium 2.3; Potassium 4.8; Sodium 144; TSH 0.890   Recent Lipid Panel    Component Value Date/Time   CHOL 122 12/16/2020 0933   TRIG 51 12/16/2020 0933   HDL 83 12/16/2020 0933   CHOLHDL 1.5 12/16/2020 0933   CHOLHDL 2.0 09/01/2020 0241   VLDL 11 09/01/2020 0241   LDLCALC 27 12/16/2020 0933         ECG personally reviewed by me today-none today.  Echocardiogram 09/01/2020  IMPRESSIONS     1. Left ventricular ejection fraction, by estimation, is 55 to 60%. The  left ventricle has normal function. The left ventricle has no regional  wall motion abnormalities. There is mild concentric left ventricular  hypertrophy. Left ventricular diastolic  parameters are indeterminate.   2. Right ventricular systolic function is low normal. The right  ventricular size is normal.   3. The mitral valve is grossly normal. Trivial mitral valve  regurgitation. No evidence of mitral stenosis.   4. The aortic valve has an indeterminant number of cusps. Aortic valve  regurgitation is not visualized. No aortic stenosis is present.   5. The inferior vena cava is normal in size with  greater than 50%  respiratory variability, suggesting right atrial pressure of 3 mmHg.   Comparison(s): No significant change from prior study.   Conclusion(s)/Recommendation(s): Normal biventricular function without  evidence of hemodynamically significant valvular heart disease.   Echocardiogram 10/06/2021  IMPRESSIONS     1. Left ventricular ejection fraction, by estimation, is 55 to 60%. Left  ventricular ejection fraction by 3D volume is 58 %. The left ventricle has  normal function. The left ventricle has no regional wall motion  abnormalities. Left ventricular diastolic   parameters are consistent with Grade II diastolic dysfunction  (pseudonormalization).   2. Right ventricular systolic function is normal. The right ventricular  size is mildly enlarged.   3. Left atrial size was mildly dilated.   4. Right atrial size was mildly dilated.   5. The mitral valve is normal in structure. No evidence of mitral valve  regurgitation. No evidence of mitral stenosis.   6. The aortic valve is normal in structure. There is mild calcification  of the aortic valve. There is mild thickening of the aortic valve. Aortic  valve regurgitation is trivial. Aortic valve sclerosis is  present, with no  evidence of aortic valve  stenosis.   7. The inferior vena cava is normal in size with greater than 50%  respiratory variability, suggesting right atrial pressure of 3 mmHg.   Cardiac catheterization 09/02/2020    Prox RCA lesion is 80% stenosed.   A drug-eluting stent was successfully placed using a SYNERGY XD 3.0X24.   Post intervention, there is a 0% residual stenosis.   Successful PCI of the proximal RCA with IVUS guidance, orbital atherectomy, Shockwave lithotripsy and DES x 1   Plan: DAPT for one year. If stable anticipate DC in am.  Carotid Doppler 11/07/2021 Summary:  Right Carotid: Velocities in the right ICA are consistent with a 1-39%  stenosis.   Left Carotid: Velocities in the  left ICA are consistent with a 1-39%  stenosis.   Vertebrals: Bilateral vertebral arteries demonstrate antegrade flow.  Subclavians: Normal flow hemodynamics were seen in bilateral subclavian               arteries.   Assessment & Plan   1.   DOE, SOB, dizziness-unchanged work of breathing with increased physical activity.  Symptoms have been ongoing for the past several days.  Echocardiogram 10/06/2021 reassuring from a cardiac standpoint.  Details above.  Carotid Doppler showed bilateral 1-39% stenosis. Maintain p.o. hydration Allow pause before increasing physical activity changing positions Continue lower extremity support stockings Epley maneuvers given Recommended that she follow-up with ENT if no benefit after seeing neurology  Coronary artery disease-stable.  No chest pain today.  No recent episodes of chest discomfort.  Status post CABG x3 by Dr. Darcey Nora in 2013.  With subsequent cardiac catheterization 09/02/2020 and underwent orbital arthrectomy by Dr. Martinique.  A DES was placed in her RCA. Continue aspirin, amlodipine, Plavix, metoprolol, nitroglycerin Heart healthy low-sodium diet Increase physical activity as tolerated  Hyperlipidemia-LDL 27 on 12/16/20 Continue aspirin, Repatha, atorvastatin Heart healthy low-sodium high-fiber diet Increase physical activity as tolerated Request lab work from PCP  Essential hypertension-BP today 116/70 Continue amlodipine, Lopressor, nitroglycerin Heart healthy low-sodium diet Increase physical activity as tolerated   Disposition: Follow-up with Dr. Gwenlyn Found as scheduled   Jossie Ng. Yehya Brendle NP-C     12/06/2021, 11:01 AM [address] [address] Office [phone] Fax [phone]  Notice: This dictation was prepared with Dragon dictation along with smaller phrase technology. Any transcriptional errors that result from this process are unintentional and may not be corrected upon review.  I  spent 13 minutes examining this patient, reviewing medications, and using patient centered shared decision making involving her cardiac care.  Prior to her visit I spent greater than 20 minutes reviewing her past medical history,  medications, and prior cardiac tests.

## 2021-12-01 ENCOUNTER — Other Ambulatory Visit (HOSPITAL_BASED_OUTPATIENT_CLINIC_OR_DEPARTMENT_OTHER): Payer: Self-pay | Admitting: Pharmacist Clinician (PhC)/ Clinical Pharmacy Specialist

## 2021-12-01 MED ORDER — REPATHA SURECLICK 140 MG/ML ~~LOC~~ SOAJ: 140.0000 mg | SUBCUTANEOUS | 3 refills | Status: DC

## 2021-12-05 ENCOUNTER — Other Ambulatory Visit: Payer: Self-pay | Admitting: Neurosurgery

## 2021-12-05 DIAGNOSIS — M4722 Other spondylosis with radiculopathy, cervical region: Secondary | ICD-10-CM | POA: Diagnosis not present

## 2021-12-05 DIAGNOSIS — Z6831 Body mass index (BMI) 31.0-31.9, adult: Secondary | ICD-10-CM | POA: Diagnosis not present

## 2021-12-05 DIAGNOSIS — R519 Headache, unspecified: Secondary | ICD-10-CM

## 2021-12-05 DIAGNOSIS — M542 Cervicalgia: Secondary | ICD-10-CM | POA: Diagnosis not present

## 2021-12-05 DIAGNOSIS — M4712 Other spondylosis with myelopathy, cervical region: Secondary | ICD-10-CM

## 2021-12-06 ENCOUNTER — Ambulatory Visit: Payer: PPO | Attending: General Practice | Admitting: General Practice

## 2021-12-06 ENCOUNTER — Encounter: Payer: Self-pay | Admitting: General Practice

## 2021-12-06 VITALS — BP 116/70 | HR 95 | Ht 64.0 in | Wt 182.8 lb

## 2021-12-06 DIAGNOSIS — I10 Essential (primary) hypertension: Secondary | ICD-10-CM | POA: Diagnosis not present

## 2021-12-06 DIAGNOSIS — R42 Dizziness and giddiness: Secondary | ICD-10-CM | POA: Diagnosis not present

## 2021-12-06 DIAGNOSIS — E785 Hyperlipidemia, unspecified: Secondary | ICD-10-CM | POA: Diagnosis not present

## 2021-12-06 DIAGNOSIS — Z951 Presence of aortocoronary bypass graft: Secondary | ICD-10-CM | POA: Diagnosis not present

## 2021-12-06 DIAGNOSIS — R0609 Other forms of dyspnea: Secondary | ICD-10-CM

## 2021-12-06 MED ORDER — ATORVASTATIN CALCIUM 20 MG PO TABS: 20.0000 mg | ORAL_TABLET | Freq: Every day | ORAL | 3 refills | Status: DC

## 2021-12-06 MED ORDER — CLOPIDOGREL BISULFATE 75 MG PO TABS: 75.0000 mg | ORAL_TABLET | Freq: Every day | ORAL | 3 refills | Status: DC

## 2021-12-06 NOTE — Patient Instructions (Signed)
Medication Instructions:  The current medical regimen is effective;  continue present plan and medications as directed. Please refer to the Current Medication list given to you today.  *If you need a refill on your cardiac medications before your next appointment, please call your pharmacy*  Lab Work: NONE If you have labs (blood work) drawn today and your tests are completely normal, you will receive your results only by:  [place] (if you have MyChart) OR  A paper copy in the mail If you have any lab test that is abnormal or we need to change your treatment, we will call you to review the results.  Testing/Procedures: NONE  Follow-Up: At [facility], you and your health needs are our priority.  As part of our continuing mission to provide you with exceptional heart care, we have created designated Provider Care Teams.  These Care Teams include your primary Cardiologist (physician) and Advanced Practice Providers (APPs -  Physician Assistants and Nurse Practitioners) who all work together to provide you with the care you need, when you need it.  Your next appointment:   KEEP SCHEDULED   The format for your next appointment:   In Person  Provider:   Quay Burow, MD     Other Instructions PLEASE DO [place]

## 2021-12-13 ENCOUNTER — Ambulatory Visit
Admission: RE | Admit: 2021-12-13 | Discharge: 2021-12-13 | Disposition: A | Discharge status: Home / Self Care | Payer: PPO | Source: Ambulatory Visit | Attending: Neurosurgery | Admitting: Neurosurgery

## 2021-12-13 DIAGNOSIS — R42 Dizziness and giddiness: Secondary | ICD-10-CM | POA: Diagnosis not present

## 2021-12-13 DIAGNOSIS — R519 Headache, unspecified: Secondary | ICD-10-CM | POA: Diagnosis not present

## 2021-12-13 DIAGNOSIS — M4802 Spinal stenosis, cervical region: Secondary | ICD-10-CM | POA: Diagnosis not present

## 2021-12-13 DIAGNOSIS — M4712 Other spondylosis with myelopathy, cervical region: Secondary | ICD-10-CM

## 2021-12-13 DIAGNOSIS — M542 Cervicalgia: Secondary | ICD-10-CM | POA: Diagnosis not present

## 2021-12-15 ENCOUNTER — Other Ambulatory Visit: Payer: PPO

## 2021-12-19 DIAGNOSIS — Z6831 Body mass index (BMI) 31.0-31.9, adult: Secondary | ICD-10-CM | POA: Diagnosis not present

## 2021-12-19 DIAGNOSIS — M47892 Other spondylosis, cervical region: Secondary | ICD-10-CM | POA: Diagnosis not present

## 2021-12-21 ENCOUNTER — Encounter: Payer: Self-pay | Admitting: Pulmonary Disease

## 2021-12-21 ENCOUNTER — Ambulatory Visit: Payer: PPO | Admitting: Pulmonary Disease

## 2021-12-21 VITALS — BP 116/74 | HR 98 | Temp 98.4°F | Ht 64.5 in | Wt 184.2 lb

## 2021-12-21 DIAGNOSIS — I25709 Atherosclerosis of coronary artery bypass graft(s), unspecified, with unspecified angina pectoris: Secondary | ICD-10-CM

## 2021-12-21 DIAGNOSIS — J45909 Unspecified asthma, uncomplicated: Secondary | ICD-10-CM | POA: Diagnosis not present

## 2021-12-21 DIAGNOSIS — R0609 Other forms of dyspnea: Secondary | ICD-10-CM | POA: Diagnosis not present

## 2021-12-21 DIAGNOSIS — K219 Gastro-esophageal reflux disease without esophagitis: Secondary | ICD-10-CM

## 2021-12-21 DIAGNOSIS — J385 Laryngeal spasm: Secondary | ICD-10-CM

## 2021-12-21 LAB — POCT EXHALED NITRIC OXIDE: FeNO level (ppb): 20

## 2021-12-21 NOTE — Patient Instructions (Signed)
For laryngospasm: I think we need to try treating with an antiacid for likely gastroesophageal reflux disease first Take over-the-counter Prilosec 20 mg daily for a month If no improvement in the frequency or intensity of these episodes then we can get you referred to Dr. Ernestine Conrad and his voice center for further evaluation  For exertional shortness of breath: Lung function testing Ambulatory oximetry testing today in the office Your physical exam and chest x-ray recently were normal  For asthma: As we stated today I am not sure if this is the problem but it is best to go ahead and treated Take your Advair regularly, twice a day no matter how you feel Use albuterol as needed for chest tightness wheezing or shortness of breath Use albuterol 2 puffs 10 to 15 minutes prior to exercise  For allergic rhinitis: Use Flonase regularly, not as needed 2 sprays each nostril daily  We will see you back in 4 to 6 weeks to go over the results of the lung function test and see how you are doing with the addition of the above medical treatment.

## 2021-12-21 NOTE — Progress Notes (Signed)
Synopsis: Referred in December 2023 for dyspnea  Subjective:   PATIENT ID: [name] Long GENDER: female DOB: 01-14-1940, MRN: [id number]   HPI  Chief Complaint  Patient presents with   Consult    SOB    [name] Long is here to see me for dyspnea: > last year she dyspnea and was noted to have worsening coronary disease requiring a stent > in the last year or so she's noticed increasing chest congestion, post nasal drip and coughing > she will have severe episodes of cough and difficulty breathing when the mucus plugs up her airway > she'll get really short of breath when this happens > this happens 2 times a month > actually she says that the drainage happens after the spell of inability to move air > rare heartburn or indigestion > she hasn't really been able ot use albuterol  > typically these spells last 10-15 minutes > these spells have been happening for "a long time" > she has been seen by another specialist for this and was told that this was transient and would eventually go away > in addition to these spells, she has dyspnea that happens "all the time" > the dyspnea occurs with any exertion > the dyspnea didn't go away after the stent > she has been going to the gym 3 times a week > she gets dyspnea with exertion> she feels tightness all over her body and in her chest.  She has been increasing the intensity of the activities, specifically the weight.  > there is no clear environmental trigger to her breathlessness >   Years ago when she moved up here from [place] and had difficulty breathing.  She was given Advair and albuterol.  Record review: November 2023 cardiology records reviewed.  The patient has an extensive history of coronary artery disease and underwent CABG x 3 by Dr. Darcey Nora in 2013, in 2022 she had a left heart catheterization which showed multivessel disease and she ended up having angioplasty and stenting of her RCA.  In 2023 she has complained of heaviness  in her arms dyspnea when bending over and dizziness.  She is undergoing a neurology evaluation.  Echocardiogram in 2023 has been reassuring.  Medical management has been ongoing for her coronary artery disease hyperlipidemia and hypertension.  Past Medical History:  Diagnosis Date   Abnormal EKG 01/12/2011   Allergy    RHINITIS   Arthritis    neck, back knees, hips   Asthma    Asthma 01/12/2011   Back pain, chronic--plans were for cervical disc surgery week of 01/16/11 01/12/2011   Blood clot in vein 2004   L leg   Blood transfusion without reported diagnosis 2004   Chronic low back pain    Complication of anesthesia    post op bladder retention after surgery   Connective tissue disease ([place])    Coronary artery disease    s/p CABG x 3 in 2013, hx of PCI with DES   Crescendo angina, with EKG changes, new. 01/12/2011   Dyspnea    Fibromyalgia    Fibromyalgia    GERD (gastroesophageal reflux disease)    Headache    migraine - several in a yr.    History of stress test 04/2011   Normal stress test   Hx of echocardiogram 07/19/2011   EF 50-55%. There is mild annular calcification, there is trace migtral regurgitation, there is trace tricupid regurgitation, pericardium is thick, there is no pericardial effusion.   Hyperlipidemia  Hypertension    Myocardial infarction [facility]) 2013   triple heart bypass   Neuromuscular disorder ([place])    fibromyalgia    Osteoporosis    OSTEOPENIA   Personal history of colonic polyp - adenoma 08/05/2013   08/2013 - 7 mm adenoma - consider repeat colonoscopy 2020 (age 37)   Pneumonia 2018   numerous times, only admitted one time for pneumonia but she has had pneumonia numerous times;   02-19-2018 END OF NOVEMBER 2019  WAS ADMITTED FOR ABOUT  A WEEK DUE TO "DOUBLE PNEUMONIA", REPORTS TODAY FEELING BACK TO HER NORMAL , DENIES SOB, O2 SAT AT 49 ROOM AIR , AFEBRILE, OTHER VSS    PONV (postoperative nausea and vomiting)    PPD positive    Right carotid  bruit    Status post coronary artery bypass grafting    Urinary incontinence    post op, "sleepy bladder", urinar retention      Family History  Adopted: Yes  Problem Relation Age of Onset   Hypertension Brother      Social History   Socioeconomic History   Marital status: Married    Spouse name: Not on file   Number of children: Not on file   Years of education: Not on file   Highest education level: Not on file  Occupational History   Not on file  Tobacco Use   Smoking status: Former    Types: Cigarettes    Quit date: 01/12/1980    Years since quitting: 41.9   Smokeless tobacco: Never  Vaping Use   Vaping Use: Never used  Substance and Sexual Activity   Alcohol use: No    Alcohol/week: 0.0 standard drinks of alcohol   Drug use: No   Sexual activity: Yes  Other Topics Concern   Not on file  Social History Narrative   Right handed   Social Determinants of Health   Financial Resource Strain: Not on file  Food Insecurity: Not on file  Transportation Needs: Not on file  Physical Activity: Not on file  Stress: Not on file  Social Connections: Not on file  Intimate Partner Violence: Not on file     Allergies  Allergen Reactions   Dimenhydrinate Other (See Comments)    "locks up kidneys"   Morphine And Related Nausea And Vomiting   Percocet [Oxycodone-Acetaminophen] Other (See Comments)    hallucinations   Zolpidem Other (See Comments)    Unusual behavior     Outpatient Medications Prior to Visit  Medication Sig Dispense Refill   albuterol (PROVENTIL HFA) 108 (90 Base) MCG/ACT inhaler INHALE 2 PUFF AS DIRECTED FOUR TIMES A DAY AS NEEDED 6.7 g 1   amLODipine (NORVASC) 5 MG tablet Take 1 tablet (5 mg total) by mouth daily. 90 tablet 3   aspirin EC 81 MG tablet Take 81 mg by mouth daily. Swallow whole.     atorvastatin (LIPITOR) 20 MG tablet Take 1 tablet (20 mg total) by mouth daily. 90 tablet 3   Calcium Carb-Cholecalciferol (CALTRATE 600+D3 PO) Take 1  tablet by mouth daily.     carisoprodol (SOMA) 350 MG tablet Take 350 mg by mouth every 8 (eight) hours as needed (for arthritis pain).      clopidogrel (PLAVIX) 75 MG tablet Take 1 tablet (75 mg total) by mouth daily with breakfast. 90 tablet 3   Evolocumab (REPATHA SURECLICK) 536 MG/ML SOAJ Inject 140 mg into the skin every 14 (fourteen) days. 6 mL 3   fluticasone-salmeterol (ADVAIR DISKUS) 500-50  MCG/ACT AEPB Inhale 1 puff into the lungs in the morning and at bedtime. 180 each 3   gabapentin (NEURONTIN) 600 MG tablet Take 1,200-1,800 mg by mouth See admin instructions. Take 2 tablets (1200 mg) by mouth in the morning & take 3 tablets (1800 mg) by mouth at night     HYDROmorphone (DILAUDID) 4 MG tablet Take 1 tablet (4 mg total) by mouth every 4 (four) hours as needed for severe pain. 10 tablet 0   magnesium oxide (MAG-OX) 400 MG tablet Take 400 mg by mouth at bedtime.     metoprolol tartrate (LOPRESSOR) 25 MG tablet Take 1 tablet (25 mg total) by mouth 2 (two) times daily. 180 tablet 3   montelukast (SINGULAIR) 10 MG tablet TAKE 1 TABLET BY MOUTH DAILY 90 tablet 0   Multiple Vitamins-Minerals (PRESERVISION AREDS 2 PO) Take 1 tablet by mouth in the morning and at bedtime.     nitroGLYCERIN (NITROSTAT) 0.4 MG SL tablet Place 1 tablet (0.4 mg total) under the tongue every 5 (five) minutes x 3 doses as needed for chest pain. 25 tablet 1   silodosin (RAPAFLO) 8 MG CAPS capsule TAKE ONE CAPSULE BY MOUTH DAILY 30 capsule 3   VITAMIN D PO Take by mouth.     No facility-administered medications prior to visit.    Review of Systems  Constitutional:  Negative for chills, fever, malaise/fatigue and weight loss.  HENT:  Negative for congestion, nosebleeds, sinus pain and sore throat.   Eyes:  Negative for photophobia, pain and discharge.  Respiratory:  Positive for cough and shortness of breath. Negative for hemoptysis, sputum production and wheezing.   Cardiovascular:  Negative for chest pain,  palpitations, orthopnea and leg swelling.  Gastrointestinal:  Negative for abdominal pain, constipation, diarrhea, nausea and vomiting.  Genitourinary:  Negative for dysuria, frequency, hematuria and urgency.  Musculoskeletal:  Negative for back pain, joint pain, myalgias and neck pain.  Skin:  Negative for itching and rash.  Neurological:  Negative for tingling, tremors, sensory change, speech change, focal weakness, seizures, weakness and headaches.  Psychiatric/Behavioral:  Negative for memory loss, substance abuse and suicidal ideas. The patient is not nervous/anxious.       Objective:  Physical Exam   Vitals:   12/21/21 0826  BP: 116/74  Pulse: 98  Temp: 98.4 F (36.9 C)  TempSrc: Oral  SpO2: 96%  Weight: 184 lb 3.2 oz (83.6 kg)  Height: 5' 4.5" (1.638 m)    Gen: well appearing, no acute distress HENT: NCAT, OP clear, neck supple without masses Eyes: PERRL, EOMi Lymph: no cervical lymphadenopathy PULM: CTA B CV: RRR, no mgr, no JVD GI: BS+, soft, nontender, no hsm Derm: no rash or skin breakdown MSK: normal bulk and tone Neuro: A&Ox4, CN II-XII intact, strength 5/5 in all 4 extremities Psyche: normal mood and affect   CBC    Component Value Date/Time   WBC 7.0 09/19/2021 1604   WBC 7.6 09/03/2020 0225   RBC 4.51 09/19/2021 1604   RBC 3.87 09/03/2020 0225   HGB 13.0 09/19/2021 1604   HCT 39.9 09/19/2021 1604   PLT 195 09/19/2021 1604   MCV 89 09/19/2021 1604   MCH 28.8 09/19/2021 1604   MCH 29.2 09/03/2020 0225   MCHC 32.6 09/19/2021 1604   MCHC 31.7 09/03/2020 0225   RDW 12.9 09/19/2021 1604   LYMPHSABS 1.4 12/16/2020 0933   MONOABS 0.4 05/25/2020 1147   EOSABS 0.2 12/16/2020 0933   BASOSABS 0.0 12/16/2020 0933  Chest imaging: October 2023 chest x-ray shows sternal wires in place, status post coronary artery bypass grafting, normal pulmonary parenchyma, normal cardiac silhouette, no pulmonary parenchymal abnormality, personally  reviewed  PFT: December 2020 FENO 20 ppb  Labs: September 2023 hemoglobin 13.0 g/dL, 200K cell/uL eosinophils  Path:  Echo: October 2023 TTE LVEF 55 to 76%, grade 2 diastolic dysfunction, RV systolic function normal with RV enlargement mild, left atrial mildly dilated, right atrium mildly dilated  Heart Catheterization:  Ambulatory oximetry: December 2023 walked 500 feet in the office on room air and O2 saturation dropped no lower than 95%     Assessment & Plan:   Coronary artery disease involving coronary bypass graft of native heart with angina pectoris (HCC)  Gastroesophageal reflux disease, unspecified whether esophagitis present  Laryngospasm  DOE (dyspnea on exertion)  Asthma, unspecified asthma severity, unspecified whether complicated, unspecified whether persistent  Discussion: [name] Long describes 2 separate problems.  First she says that she has episodes of laryngospasm which is previously been assessed by an otolaryngologist.  I worry that acid reflux and postnasal drip may contribute to the frequency and severity of these episodes.  Second, she has exertional dyspnea which may be due to an underlying lung problem but I suspect that it is mostly due to physical deconditioning in the setting of multiple musculoskeletal issues and underlying cardiac disease.  Plan: For laryngospasm: I think we need to try treating with an antiacid for likely gastroesophageal reflux disease first Take over-the-counter Prilosec 20 mg daily for a month If no improvement in the frequency or intensity of these episodes then we can get you referred to Dr. Ernestine Conrad and his voice center for further evaluation  For exertional shortness of breath: Lung function testing Ambulatory oximetry testing today in the office Your physical exam and chest x-ray recently were normal  For asthma: As we stated today I am not sure if this is the problem but it is best to go ahead and  treated Take your Advair regularly, twice a day no matter how you feel Use albuterol as needed for chest tightness wheezing or shortness of breath Use albuterol 2 puffs 10 to 15 minutes prior to exercise FENO today  For allergic rhinitis: Use Flonase regularly, not as needed 2 sprays each nostril daily  We will see you back in 4 to 6 weeks to go over the results of the lung function test and see how you are doing with the addition of the above medical treatment.  Immunizations: Immunization History  Administered Date(s) Administered   Fluad Quad(high Dose 65+) 08/22/2018, 10/13/2019   Influenza, High Dose Seasonal PF 10/18/2013, 12/01/2016, 10/01/2017, 09/09/2021   Influenza,inj,Quad PF,6+ Mos 09/23/2012   Influenza-Unspecified 12/04/2012, 10/07/2013, 11/02/2013, 09/17/2014, 10/17/2014, 10/22/2015, 12/02/2016, 09/29/2020   PFIZER Comirnaty(Gray Top)Covid-19 Tri-Sucrose Vaccine 10/11/2021   PFIZER(Purple Top)SARS-COV-2 Vaccination 02/06/2019, 03/03/2019, 10/13/2019   Pfizer Covid-19 Vaccine Bivalent Booster 27yr & up 11/08/2020   Pneumococcal Conjugate-13 04/08/2015, 02/17/2016   Pneumococcal Polysaccharide-23 11/19/2002, 09/23/2012, 12/04/2012   Tdap 07/19/2009, 01/13/2020   Zoster Recombinat (Shingrix) 08/09/2016, 11/27/2016   Zoster, Live 04/16/2008, 03/30/2010     Current Outpatient Medications:    albuterol (PROVENTIL HFA) 108 (90 Base) MCG/ACT inhaler, INHALE 2 PUFF AS DIRECTED FOUR TIMES A DAY AS NEEDED, Disp: 6.7 g, Rfl: 1   amLODipine (NORVASC) 5 MG tablet, Take 1 tablet (5 mg total) by mouth daily., Disp: 90 tablet, Rfl: 3   aspirin EC 81 MG tablet, Take 81 mg by mouth daily. Swallow  whole., Disp: , Rfl:    atorvastatin (LIPITOR) 20 MG tablet, Take 1 tablet (20 mg total) by mouth daily., Disp: 90 tablet, Rfl: 3   Calcium Carb-Cholecalciferol (CALTRATE 600+D3 PO), Take 1 tablet by mouth daily., Disp: , Rfl:    carisoprodol (SOMA) 350 MG tablet, Take 350 mg by mouth every  8 (eight) hours as needed (for arthritis pain). , Disp: , Rfl:    clopidogrel (PLAVIX) 75 MG tablet, Take 1 tablet (75 mg total) by mouth daily with breakfast., Disp: 90 tablet, Rfl: 3   Evolocumab (REPATHA SURECLICK) 076 MG/ML SOAJ, Inject 140 mg into the skin every 14 (fourteen) days., Disp: 6 mL, Rfl: 3   fluticasone-salmeterol (ADVAIR DISKUS) 500-50 MCG/ACT AEPB, Inhale 1 puff into the lungs in the morning and at bedtime., Disp: 180 each, Rfl: 3   gabapentin (NEURONTIN) 600 MG tablet, Take 1,200-1,800 mg by mouth See admin instructions. Take 2 tablets (1200 mg) by mouth in the morning & take 3 tablets (1800 mg) by mouth at night, Disp: , Rfl:    HYDROmorphone (DILAUDID) 4 MG tablet, Take 1 tablet (4 mg total) by mouth every 4 (four) hours as needed for severe pain., Disp: 10 tablet, Rfl: 0   magnesium oxide (MAG-OX) 400 MG tablet, Take 400 mg by mouth at bedtime., Disp: , Rfl:    metoprolol tartrate (LOPRESSOR) 25 MG tablet, Take 1 tablet (25 mg total) by mouth 2 (two) times daily., Disp: 180 tablet, Rfl: 3   montelukast (SINGULAIR) 10 MG tablet, TAKE 1 TABLET BY MOUTH DAILY, Disp: 90 tablet, Rfl: 0   Multiple Vitamins-Minerals (PRESERVISION AREDS 2 PO), Take 1 tablet by mouth in the morning and at bedtime., Disp: , Rfl:    nitroGLYCERIN (NITROSTAT) 0.4 MG SL tablet, Place 1 tablet (0.4 mg total) under the tongue every 5 (five) minutes x 3 doses as needed for chest pain., Disp: 25 tablet, Rfl: 1   silodosin (RAPAFLO) 8 MG CAPS capsule, TAKE ONE CAPSULE BY MOUTH DAILY, Disp: 30 capsule, Rfl: 3   VITAMIN D PO, Take by mouth., Disp: , Rfl:

## 2021-12-23 DIAGNOSIS — M47812 Spondylosis without myelopathy or radiculopathy, cervical region: Secondary | ICD-10-CM | POA: Diagnosis not present

## 2021-12-26 ENCOUNTER — Other Ambulatory Visit: Payer: Self-pay | Admitting: Family Medicine

## 2021-12-26 DIAGNOSIS — R339 Retention of urine, unspecified: Secondary | ICD-10-CM

## 2022-01-04 DIAGNOSIS — M47812 Spondylosis without myelopathy or radiculopathy, cervical region: Secondary | ICD-10-CM | POA: Diagnosis not present

## 2022-01-04 DIAGNOSIS — Z79891 Long term (current) use of opiate analgesic: Secondary | ICD-10-CM | POA: Diagnosis not present

## 2022-01-04 DIAGNOSIS — M150 Primary generalized (osteo)arthritis: Secondary | ICD-10-CM | POA: Diagnosis not present

## 2022-01-04 DIAGNOSIS — G894 Chronic pain syndrome: Secondary | ICD-10-CM | POA: Diagnosis not present

## 2022-01-09 DIAGNOSIS — M47892 Other spondylosis, cervical region: Secondary | ICD-10-CM | POA: Diagnosis not present

## 2022-01-09 DIAGNOSIS — M4722 Other spondylosis with radiculopathy, cervical region: Secondary | ICD-10-CM | POA: Diagnosis not present

## 2022-01-09 DIAGNOSIS — Z6831 Body mass index (BMI) 31.0-31.9, adult: Secondary | ICD-10-CM | POA: Diagnosis not present

## 2022-01-13 ENCOUNTER — Other Ambulatory Visit: Payer: PPO

## 2022-01-17 ENCOUNTER — Ambulatory Visit (INDEPENDENT_AMBULATORY_CARE_PROVIDER_SITE_OTHER): Payer: PPO

## 2022-01-17 VITALS — Ht 64.5 in | Wt 175.0 lb

## 2022-01-17 DIAGNOSIS — Z0000 Encounter for general adult medical examination without abnormal findings: Secondary | ICD-10-CM | POA: Diagnosis not present

## 2022-01-17 NOTE — Patient Instructions (Signed)
[name] , Thank you for taking time to come for your Medicare Wellness Visit. I appreciate your ongoing commitment to your health goals. Please review the following plan we discussed and let me know if I can assist you in the future.   These are the goals we discussed:  Goals      Patient Stated     01/17/2022, stay alive        This is a list of the screening recommended for you and due dates:  Health Maintenance  Topic Date Due   COVID-19 Vaccine (6 - 2023-24 season) 12/06/2021   Medicare Annual Wellness Visit  01/18/2023   DTaP/Tdap/Td vaccine (3 - Td or Tdap) 01/12/2030   Pneumonia Vaccine  Completed   Flu Shot  Completed   DEXA scan (bone density measurement)  Completed   Zoster (Shingles) Vaccine  Completed   HPV Vaccine  Aged Out   Colon Cancer Screening  Discontinued    Advanced directives: copy in chart  Conditions/risks identified: none  Next appointment: Follow up in one year for your annual wellness visit    Preventive Care 65 Years and Older, Female Preventive care refers to lifestyle choices and visits with your health care provider that can promote health and wellness. What does preventive care include? A yearly physical exam. This is also called an annual well check. Dental exams once or twice a year. Routine eye exams. Ask your health care provider how often you should have your eyes checked. Personal lifestyle choices, including: Daily care of your teeth and gums. Regular physical activity. Eating a healthy diet. Avoiding tobacco and drug use. Limiting alcohol use. Practicing safe sex. Taking low-dose aspirin every day. Taking vitamin and mineral supplements as recommended by your health care provider. What happens during an annual well check? The services and screenings done by your health care provider during your annual well check will depend on your age, overall health, lifestyle risk factors, and family history of disease. Counseling  Your  health care provider may ask you questions about your: Alcohol use. Tobacco use. Drug use. Emotional well-being. Home and relationship well-being. Sexual activity. Eating habits. History of falls. Memory and ability to understand (cognition). Work and work Statistician. Reproductive health. Screening  You may have the following tests or measurements: Height, weight, and BMI. Blood pressure. Lipid and cholesterol levels. These may be checked every 5 years, or more frequently if you are over 4 years old. Skin check. Lung cancer screening. You may have this screening every year starting at age 25 if you have a 30-pack-year history of smoking and currently smoke or have quit within the past 15 years. Fecal occult blood test (FOBT) of the stool. You may have this test every year starting at age 53. Flexible sigmoidoscopy or colonoscopy. You may have a sigmoidoscopy every 5 years or a colonoscopy every 10 years starting at age 72. Hepatitis C blood test. Hepatitis B blood test. Sexually transmitted disease (STD) testing. Diabetes screening. This is done by checking your blood sugar (glucose) after you have not eaten for a while (fasting). You may have this done every 1-3 years. Bone density scan. This is done to screen for osteoporosis. You may have this done starting at age 42. Mammogram. This may be done every 1-2 years. Talk to your health care provider about how often you should have regular mammograms. Talk with your health care provider about your test results, treatment options, and if necessary, the need for more tests. Vaccines  Your health care provider may recommend certain vaccines, such as: Influenza vaccine. This is recommended every year. Tetanus, diphtheria, and acellular pertussis (Tdap, Td) vaccine. You may need a Td booster every 10 years. Zoster vaccine. You may need this after age 65. Pneumococcal 13-valent conjugate (PCV13) vaccine. One dose is recommended after age  66. Pneumococcal polysaccharide (PPSV23) vaccine. One dose is recommended after age 12. Talk to your health care provider about which screenings and vaccines you need and how often you need them. This information is not intended to replace advice given to you by your health care provider. Make sure you discuss any questions you have with your health care provider. Document Released: 01/15/2015 Document Revised: 09/08/2015 Document Reviewed: 10/20/2014 Elsevier Interactive Patient Education  2017 [place] Prevention in the Home Falls can cause injuries. They can happen to people of all ages. There are many things you can do to make your home safe and to help prevent falls. What can I do on the outside of my home? Regularly fix the edges of walkways and driveways and fix any cracks. Remove anything that might make you trip as you walk through a door, such as a raised step or threshold. Trim any bushes or trees on the path to your home. Use bright outdoor lighting. Clear any walking paths of anything that might make someone trip, such as rocks or tools. Regularly check to see if handrails are loose or broken. Make sure that both sides of any steps have handrails. Any raised decks and porches should have guardrails on the edges. Have any leaves, snow, or ice cleared regularly. Use sand or salt on walking paths during winter. Clean up any spills in your garage right away. This includes oil or grease spills. What can I do in the bathroom? Use night lights. Install grab bars by the toilet and in the tub and shower. Do not use towel bars as grab bars. Use non-skid mats or decals in the tub or shower. If you need to sit down in the shower, use a plastic, non-slip stool. Keep the floor dry. Clean up any water that spills on the floor as soon as it happens. Remove soap buildup in the tub or shower regularly. Attach bath mats securely with double-sided non-slip rug tape. Do not have throw  rugs and other things on the floor that can make you trip. What can I do in the bedroom? Use night lights. Make sure that you have a light by your bed that is easy to reach. Do not use any sheets or blankets that are too big for your bed. They should not hang down onto the floor. Have a firm chair that has side arms. You can use this for support while you get dressed. Do not have throw rugs and other things on the floor that can make you trip. What can I do in the kitchen? Clean up any spills right away. Avoid walking on wet floors. Keep items that you use a lot in easy-to-reach places. If you need to reach something above you, use a strong step stool that has a grab bar. Keep electrical cords out of the way. Do not use floor polish or wax that makes floors slippery. If you must use wax, use non-skid floor wax. Do not have throw rugs and other things on the floor that can make you trip. What can I do with my stairs? Do not leave any items on the stairs. Make sure that there are  handrails on both sides of the stairs and use them. Fix handrails that are broken or loose. Make sure that handrails are as long as the stairways. Check any carpeting to make sure that it is firmly attached to the stairs. Fix any carpet that is loose or worn. Avoid having throw rugs at the top or bottom of the stairs. If you do have throw rugs, attach them to the floor with carpet tape. Make sure that you have a light switch at the top of the stairs and the bottom of the stairs. If you do not have them, ask someone to add them for you. What else can I do to help prevent falls? Wear shoes that: Do not have high heels. Have rubber bottoms. Are comfortable and fit you well. Are closed at the toe. Do not wear sandals. If you use a stepladder: Make sure that it is fully opened. Do not climb a closed stepladder. Make sure that both sides of the stepladder are locked into place. Ask someone to hold it for you, if  possible. Clearly mark and make sure that you can see: Any grab bars or handrails. First and last steps. Where the edge of each step is. Use tools that help you move around (mobility aids) if they are needed. These include: Canes. Walkers. Scooters. Crutches. Turn on the lights when you go into a dark area. Replace any light bulbs as soon as they burn out. Set up your furniture so you have a clear path. Avoid moving your furniture around. If any of your floors are uneven, fix them. If there are any pets around you, be aware of where they are. Review your medicines with your doctor. Some medicines can make you feel dizzy. This can increase your chance of falling. Ask your doctor what other things that you can do to help prevent falls. This information is not intended to replace advice given to you by your health care provider. Make sure you discuss any questions you have with your health care provider. Document Released: 10/15/2008 Document Revised: 05/27/2015 Document Reviewed: 01/23/2014 Elsevier Interactive Patient Education  2017 [employer].

## 2022-01-17 NOTE — Progress Notes (Signed)
I connected with Theodoro Clock today by telephone and verified that I am speaking with the correct person using two identifiers. Location patient: home Location provider: work Persons participating in the virtual visit: [name] Long, [name] Long.   I discussed the limitations, risks, security and privacy concerns of performing an evaluation and management service by telephone and the availability of in person appointments. I also discussed with the patient that there may be a patient responsible charge related to this service. The patient expressed understanding and verbally consented to this telephonic visit.    Interactive audio and video telecommunications were attempted between this provider and patient, however failed, due to patient having technical difficulties OR patient did not have access to video capability.  We continued and completed visit with audio only.     Vital signs may be patient reported or missing.  Subjective:   [name] Long is a 82 y.o. female who presents for Medicare Annual (Subsequent) preventive examination.  Review of Systems     Cardiac Risk Factors include: advanced age (>3mn, >>16women);dyslipidemia;hypertension     Objective:    Today's Vitals   01/17/22 1026  Weight: 175 lb (79.4 kg)  Height: 5' 4.5" (1.638 m)  PainSc: 6    Body mass index is 29.57 kg/m.     01/17/2022   10:30 AM 11/21/2021    8:43 AM 12/16/2020    8:17 AM 12/08/2020   11:04 AM 09/01/2020   11:00 AM 08/31/2020    9:24 AM 05/25/2020   11:11 AM  Advanced Directives  Does Patient Have a Medical Advance Directive? Yes Yes Yes Yes Yes Yes Yes  Type of AParamedicof ACanovaLiving will HFort Pierce SouthLiving will Healthcare Power of AOssunLiving will Living will  HKnightsenLiving will  Does patient want to make changes to medical advance directive?     No - Patient declined Yes (ED - send  information to MyChart)   Copy of [place] Chart? Yes - validated most recent copy scanned in chart (See row information)  Yes - validated most recent copy scanned in chart (See row information)        Current Medications (verified) Outpatient Encounter Medications as of 01/17/2022  Medication Sig   albuterol (PROVENTIL HFA) 108 (90 Base) MCG/ACT inhaler INHALE 2 PUFF AS DIRECTED FOUR TIMES A DAY AS NEEDED   amLODipine (NORVASC) 5 MG tablet Take 1 tablet (5 mg total) by mouth daily.   aspirin EC 81 MG tablet Take 81 mg by mouth daily. Swallow whole.   atorvastatin (LIPITOR) 20 MG tablet Take 1 tablet (20 mg total) by mouth daily.   Calcium Carb-Cholecalciferol (CALTRATE 600+D3 PO) Take 1 tablet by mouth daily.   carisoprodol (SOMA) 350 MG tablet Take 350 mg by mouth every 8 (eight) hours as needed (for arthritis pain).    clopidogrel (PLAVIX) 75 MG tablet Take 1 tablet (75 mg total) by mouth daily with breakfast.   Evolocumab (REPATHA SURECLICK) 1491MG/ML SOAJ Inject 140 mg into the skin every 14 (fourteen) days.   fluticasone-salmeterol (ADVAIR DISKUS) 500-50 MCG/ACT AEPB Inhale 1 puff into the lungs in the morning and at bedtime.   gabapentin (NEURONTIN) 600 MG tablet Take 1,200-1,800 mg by mouth See admin instructions. Take 2 tablets (1200 mg) by mouth in the morning & take 3 tablets (1800 mg) by mouth at night   HYDROmorphone (DILAUDID) 4 MG tablet Take 1 tablet (4  mg total) by mouth every 4 (four) hours as needed for severe pain.   magnesium oxide (MAG-OX) 400 MG tablet Take 400 mg by mouth at bedtime.   metoprolol tartrate (LOPRESSOR) 25 MG tablet Take 1 tablet (25 mg total) by mouth 2 (two) times daily.   montelukast (SINGULAIR) 10 MG tablet TAKE 1 TABLET BY MOUTH DAILY   Multiple Vitamins-Minerals (PRESERVISION AREDS 2 PO) Take 1 tablet by mouth in the morning and at bedtime.   nitroGLYCERIN (NITROSTAT) 0.4 MG SL tablet Place 1 tablet (0.4 mg total) under the  tongue every 5 (five) minutes x 3 doses as needed for chest pain.   silodosin (RAPAFLO) 8 MG CAPS capsule TAKE 1 CAPSULE BY MOUTH DAILY   VITAMIN D PO Take by mouth.   No facility-administered encounter medications on file as of 01/17/2022.    Allergies (verified) Dimenhydrinate, Morphine and related, Percocet [oxycodone-acetaminophen], and Zolpidem   History: Past Medical History:  Diagnosis Date   Abnormal EKG 01/12/2011   Allergy    RHINITIS   Arthritis    neck, back knees, hips   Asthma    Asthma 01/12/2011   Back pain, chronic--plans were for cervical disc surgery week of 01/16/11 01/12/2011   Blood clot in vein 2004   L leg   Blood transfusion without reported diagnosis 2004   Chronic low back pain    Complication of anesthesia    post op bladder retention after surgery   Connective tissue disease ([place])    Coronary artery disease    s/p CABG x 3 in 2013, hx of PCI with DES   Crescendo angina, with EKG changes, new. 01/12/2011   Dyspnea    Fibromyalgia    Fibromyalgia    GERD (gastroesophageal reflux disease)    Headache    migraine - several in a yr.    History of stress test 04/2011   Normal stress test   Hx of echocardiogram 07/19/2011   EF 50-55%. There is mild annular calcification, there is trace migtral regurgitation, there is trace tricupid regurgitation, pericardium is thick, there is no pericardial effusion.   Hyperlipidemia    Hypertension    Myocardial infarction [facility]) 2013   triple heart bypass   Neuromuscular disorder (HCC)    fibromyalgia    Osteoporosis    OSTEOPENIA   Personal history of colonic polyp - adenoma 08/05/2013   08/2013 - 7 mm adenoma - consider repeat colonoscopy 2020 (age 73)   Pneumonia 2018   numerous times, only admitted one time for pneumonia but she has had pneumonia numerous times;   02-19-2018 END OF NOVEMBER 2019  WAS ADMITTED FOR ABOUT  A WEEK DUE TO "DOUBLE PNEUMONIA", REPORTS TODAY FEELING BACK TO HER NORMAL , DENIES SOB,  O2 SAT AT 55 ROOM AIR , AFEBRILE, OTHER VSS    PONV (postoperative nausea and vomiting)    PPD positive    Right carotid bruit    Status post coronary artery bypass grafting    Urinary incontinence    post op, "sleepy bladder", urinar retention    Past Surgical History:  Procedure Laterality Date   [place] Left 03/27/2019   Benign fibroaipose tissue no evidence of malignancy   BLADDER REPAIR  1985   BREAST SURGERY  1992   REDUCTION, x3 surgeries overall on breast for augmentation    CARDIAC CATHETERIZATION  2013   CARPAL TUNNEL RELEASE Bilateral [place]  FUSION   1992, 1998, 1999   CORONARY ARTERY BYPASS GRAFT  01/14/2011   Procedure: CORONARY ARTERY BYPASS GRAFTING (CABG);  Surgeon: Tharon Aquas Adelene Idler, MD;  Location: [place];  Service: Open Heart Surgery;  Laterality: N/A;  CABG x three, using right greater saphenous vein harvested endoscopically   CORONARY ATHERECTOMY N/A 09/02/2020   Procedure: CORONARY ATHERECTOMY;  Surgeon: Martinique, Peter M, MD;  Location: [place] CV LAB;  Service: Cardiovascular;  Laterality: N/A;   CORONARY STENT INTERVENTION N/A 09/02/2020   Procedure: CORONARY STENT INTERVENTION;  Surgeon: Martinique, Peter M, MD;  Location: [place] [facility];  Service: Cardiovascular;  Laterality: N/A;   EYE SURGERY Bilateral 1985   radiokerototomy   INTRAVASCULAR LITHOTRIPSY  09/02/2020   Procedure: INTRAVASCULAR LITHOTRIPSY;  Surgeon: Martinique, Peter M, MD;  Location: [place] [facility] LAB;  Service: Cardiovascular;;   INTRAVASCULAR ULTRASOUND/IVUS N/A 09/02/2020   Procedure: Intravascular Ultrasound/IVUS;  Surgeon: Martinique, Peter M, MD;  Location: [place] [facility];  Service: Cardiovascular;  Laterality: N/A;   LEFT HEART CATH AND CORS/GRAFTS ANGIOGRAPHY N/A 08/31/2020   Procedure: LEFT HEART CATH AND CORS/GRAFTS ANGIOGRAPHY;  Surgeon: Belva Crome, MD;  Location: [place] [facility];  Service: Cardiovascular;   Laterality: N/A;   LEFT HEART CATHETERIZATION WITH CORONARY ANGIOGRAM N/A 01/13/2011   Procedure: LEFT HEART CATHETERIZATION WITH CORONARY ANGIOGRAM;  Surgeon: Troy Sine, MD;  [facility];  Service: Cardiovascular;  Laterality: N/A;   PUBOVAGINAL SLING  2002   ROBOT ASSISTED LAPAROSCOPIC NEPHRECTOMY Right 02/27/2018   Procedure: XI ROBOTIC ASSISTE PARTIAL NEPHRECTOMY;  Surgeon: Ceasar Mons, MD;  Location: [facility];  Service: Urology;  Laterality: Right;   STERNAL WOUND DEBRIDEMENT  07/27/2011   Procedure: STERNAL WOUND DEBRIDEMENT;  followed by wound vac Surgeon: Ivin Poot, MD;  Location: [place];  Service: Open Heart Surgery;  Laterality: N/A;   TONSILLECTOMY  1952   TOTAL HIP ARTHROPLASTY Right 08/22/2016   Procedure: TOTAL HIP ARTHROPLASTY ANTERIOR APPROACH;  Surgeon: Melrose Nakayama, MD;  Location: [place];  Service: Orthopedics;  Laterality: Right;   TOTAL KNEE ARTHROPLASTY Right 07/22/2019   Procedure: RIGHT TOTAL KNEE ARTHROPLASTY;  Surgeon: Melrose Nakayama, MD;  Location: [facility];  Service: Orthopedics;  Laterality: Right;   TRIGGER FINGER RELEASE Right 2014   4th finger   TROCHANTERIC BURSA EXCISION Right    bursa removed, R hip   TUBAL LIGATION     Family History  Adopted: Yes  Problem Relation Age of Onset   Hypertension Brother    Social History   Socioeconomic History   Marital status: Married    Spouse name: Not on file   Number of children: Not on file   Years of education: Not on file   Highest education level: Not on file  Occupational History   Not on file  Tobacco Use   Smoking status: Former    Types: Cigarettes    Quit date: 01/12/1980    Years since quitting: 42.0   Smokeless tobacco: Never  Vaping Use   Vaping Use: Never used  Substance and Sexual Activity   Alcohol use: No    Alcohol/week: 0.0 standard drinks of alcohol   Drug use: No   Sexual activity: Yes  Other Topics Concern   Not on file  Social History Narrative    Right handed   Social Determinants of Health   Financial Resource Strain: Low Risk  (01/17/2022)   Overall Financial Resource Strain (CARDIA)  Difficulty of Paying Living Expenses: Not hard at all  Food Insecurity: No Food Insecurity (01/17/2022)   Hunger Vital Sign    Worried About Running Out of Food in the Last Year: Never true    Ran Out of Food in the Last Year: Never true  Transportation Needs: No Transportation Needs (01/17/2022)   PRAPARE - Hydrologist (Medical): No    Lack of Transportation (Non-Medical): No  Physical Activity: Insufficiently Active (01/17/2022)   Exercise Vital Sign    Days of Exercise per Week: 2 days    Minutes of Exercise per Session: 40 min  Stress: No Stress Concern Present (01/17/2022)   [place]    Feeling of Stress : Not at all  Social Connections: Not on file    Tobacco Counseling Counseling given: Not Answered   Clinical Intake:  Pre-visit preparation completed: Yes  Pain : 0-10 Pain Score: 6  Pain Type: Chronic pain Pain Location: Head Pain Descriptors / Indicators: Aching Pain Onset: More than a month ago Pain Frequency: Constant     Nutritional Status: BMI 25 -29 Overweight Nutritional Risks: None Diabetes: No  How often do you need to have someone help you when you read instructions, pamphlets, or other written materials from your doctor or pharmacy?: 1 - Never  Diabetic? no  Interpreter Needed?: No  Information entered by :: NAllen Long   Activities of Daily Living    01/17/2022   10:32 AM 12/31/2021   10:27 AM  In your present state of health, do you have any difficulty performing the following activities:  Hearing? 0 0  Vision? 0 0  Difficulty concentrating or making decisions? 0 0  Walking or climbing stairs? 1 1  Dressing or bathing? 0 0  Doing errands, shopping? 0 0  Preparing Food and eating ? N N  Using the  Toilet? N N  In the past six months, have you accidently leaked urine? N N  Do you have problems with loss of bowel control? N N  Managing your Medications? N N  Managing your Finances? N N  Housekeeping or managing your Housekeeping? N N    Patient Care Team: Denita Lung, MD as PCP - General (Family Medicine) Lorretta Harp, MD as PCP - Cardiology (Cardiology) [facility], Tami Lin, [place] as Physician Assistant (Cardiology)  Indicate any recent Medical Services you may have received from other than [facility] providers in the past year (date may be approximate).     Assessment:   This is a routine wellness examination for [facility].  Hearing/Vision screen Vision Screening - Comments:: Regular eye exams, Dr. Katy Fitch   Dietary issues and exercise activities discussed: Current Exercise Habits: Home exercise routine, Time (Minutes): 40, Frequency (Times/Week): 2, Weekly Exercise (Minutes/Week): 80   Goals Addressed             This Visit's Progress    Patient Stated       01/17/2022, stay alive       Depression Screen    01/17/2022   10:31 AM 12/16/2020    8:18 AM 12/11/2019    8:32 AM 12/05/2018   12:20 PM 10/01/2017    9:34 AM 07/31/2016    9:05 AM  PHQ 2/9 Scores  PHQ - 2 Score 0 0 0 0 0 0  PHQ- 9 Score 0         Fall Risk    01/17/2022   10:31 AM  12/31/2021   10:27 AM 12/16/2021   12:01 PM 11/21/2021    8:43 AM 12/16/2020    8:17 AM  [place] in the past year? 0 0 0 0 0  Number falls in past yr: 0 0 0 0 0  Injury with Fall? 0 0 0 0 0  Risk for fall due to : Medication side effect    No Fall Risks  Follow up Falls prevention discussed;Education provided;Falls evaluation completed   Falls evaluation completed     FALL RISK PREVENTION PERTAINING TO THE HOME:  Any stairs in or around the home? Yes  If so, are there any without handrails? No  Home free of loose throw rugs in walkways, pet beds, electrical cords, etc? Yes  Adequate lighting in your home to  reduce risk of falls? Yes   ASSISTIVE DEVICES UTILIZED TO PREVENT FALLS:  Life alert? No  Use of a cane, walker or w/c? No  Grab bars in the bathroom? Yes  Shower chair or bench in shower? Yes  Elevated toilet seat or a handicapped toilet? Yes   TIMED UP AND GO:  Was the test performed? No .       Cognitive Function:        01/17/2022   10:34 AM  6CIT Screen  What Year? 0 points  What month? 0 points  What time? 0 points  Count back from 20 0 points  Months in reverse 0 points  Repeat phrase 0 points  Total Score 0 points    Immunizations Immunization History  Administered Date(s) Administered   Fluad Quad(high Dose 65+) 08/22/2018, 10/13/2019   Influenza, High Dose Seasonal PF 10/18/2013, 12/01/2016, 10/01/2017, 09/09/2021   Influenza,inj,Quad PF,6+ Mos 09/23/2012   Influenza-Unspecified 12/04/2012, 10/07/2013, 11/02/2013, 09/17/2014, 10/17/2014, 10/22/2015, 12/02/2016, 09/29/2020   PFIZER Comirnaty(Gray Top)Covid-19 Tri-Sucrose Vaccine 10/11/2021   PFIZER(Purple Top)SARS-COV-2 Vaccination 02/06/2019, 03/03/2019, 10/13/2019   Pfizer Covid-19 Vaccine Bivalent Booster 18yr & up 11/08/2020   Pneumococcal Conjugate-13 04/08/2015, 02/17/2016   Pneumococcal Polysaccharide-23 11/19/2002, 09/23/2012, 12/04/2012   Tdap 07/19/2009, 01/13/2020   Zoster Recombinat (Shingrix) 08/09/2016, 11/27/2016   Zoster, Live 04/16/2008, 03/30/2010    TDAP status: Up to date  Flu Vaccine status: Up to date  Pneumococcal vaccine status: Up to date  Covid-19 vaccine status: Completed vaccines  Qualifies for Shingles Vaccine? Yes   Zostavax completed Yes   Shingrix Completed?: Yes  Screening Tests Health Maintenance  Topic Date Due   COVID-19 Vaccine (6 - 2023-24 season) 12/06/2021   Medicare Annual Wellness (AWV)  12/16/2021   DTaP/Tdap/Td (3 - Td or Tdap) 01/12/2030   Pneumonia Vaccine 82 Years old  Completed   INFLUENZA VACCINE  Completed   DEXA SCAN  Completed   Zoster  Vaccines- Shingrix  Completed   HPV VACCINES  Aged Out   COLONOSCOPY (Pts [phone] coverage will need to be confirmed)  Discontinued    Health Maintenance  Health Maintenance Due  Topic Date Due   COVID-19 Vaccine (6 - 2023-24 season) 12/06/2021   Medicare Annual Wellness (AWV)  12/16/2021    Colorectal cancer screening: No longer required.   Mammogram status: Completed 03/15/2020. Repeat every 2 years  Bone Density status: Completed 09/30/2019.  Lung Cancer Screening: (Low Dose CT Chest recommended if Age 81-80ears, 30 pack-year currently smoking OR have quit w/in 15years.) does not qualify.   Lung Cancer Screening Referral: no  Additional Screening:  Hepatitis C Screening: does not qualify;   Vision Screening: Recommended annual ophthalmology  exams for early detection of glaucoma and other disorders of the eye. Is the patient up to date with their annual eye exam?  Yes  Who is the provider or what is the name of the office in which the patient attends annual eye exams? Dr. Katy Fitch  If pt is not established with a provider, would they like to be referred to a provider to establish care? No .   Dental Screening: Recommended annual dental exams for proper oral hygiene  Community Resource Referral / Chronic Care Management: CRR required this visit?  No   CCM required this visit?  No      Plan:     I have personally reviewed and noted the following in the patient's chart:   Medical and social history Use of alcohol, tobacco or illicit drugs  Current medications and supplements including opioid prescriptions. Patient is not currently taking opioid prescriptions. Functional ability and status Nutritional status Physical activity Advanced directives List of other physicians Hospitalizations, surgeries, and ER visits in previous 12 months Vitals Screenings to include cognitive, depression, and falls Referrals and appointments  In addition, I have reviewed  and discussed with patient certain preventive protocols, quality metrics, and best practice recommendations. A written personalized care plan for preventive services as well as general preventive health recommendations were provided to patient.     Kellie Simmering, Long   09/30/2444   Nurse Notes: none  Due to this being a virtual visit, the after visit summary with patients personalized plan was offered to patient via mail or my-chart.  Patient would like to access on my-chart

## 2022-01-23 DIAGNOSIS — J4530 Mild persistent asthma, uncomplicated: Secondary | ICD-10-CM | POA: Insufficient documentation

## 2022-01-24 ENCOUNTER — Encounter: Payer: Self-pay | Admitting: Family Medicine

## 2022-01-24 ENCOUNTER — Ambulatory Visit (INDEPENDENT_AMBULATORY_CARE_PROVIDER_SITE_OTHER): Payer: PPO | Admitting: Family Medicine

## 2022-01-24 VITALS — BP 130/80 | HR 80 | Temp 97.9°F | Ht 63.5 in | Wt 185.0 lb

## 2022-01-24 DIAGNOSIS — M8580 Other specified disorders of bone density and structure, unspecified site: Secondary | ICD-10-CM

## 2022-01-24 DIAGNOSIS — H9190 Unspecified hearing loss, unspecified ear: Secondary | ICD-10-CM | POA: Diagnosis not present

## 2022-01-24 DIAGNOSIS — K219 Gastro-esophageal reflux disease without esophagitis: Secondary | ICD-10-CM

## 2022-01-24 DIAGNOSIS — M069 Rheumatoid arthritis, unspecified: Secondary | ICD-10-CM

## 2022-01-24 DIAGNOSIS — E785 Hyperlipidemia, unspecified: Secondary | ICD-10-CM | POA: Diagnosis not present

## 2022-01-24 DIAGNOSIS — J4530 Mild persistent asthma, uncomplicated: Secondary | ICD-10-CM | POA: Diagnosis not present

## 2022-01-24 DIAGNOSIS — R339 Retention of urine, unspecified: Secondary | ICD-10-CM

## 2022-01-24 DIAGNOSIS — Z85528 Personal history of other malignant neoplasm of kidney: Secondary | ICD-10-CM

## 2022-01-24 DIAGNOSIS — E559 Vitamin D deficiency, unspecified: Secondary | ICD-10-CM | POA: Diagnosis not present

## 2022-01-24 DIAGNOSIS — Z0000 Encounter for general adult medical examination without abnormal findings: Secondary | ICD-10-CM | POA: Diagnosis not present

## 2022-01-24 DIAGNOSIS — I700 Atherosclerosis of aorta: Secondary | ICD-10-CM

## 2022-01-24 DIAGNOSIS — I10 Essential (primary) hypertension: Secondary | ICD-10-CM | POA: Diagnosis not present

## 2022-01-24 DIAGNOSIS — M797 Fibromyalgia: Secondary | ICD-10-CM

## 2022-01-24 DIAGNOSIS — I25709 Atherosclerosis of coronary artery bypass graft(s), unspecified, with unspecified angina pectoris: Secondary | ICD-10-CM | POA: Diagnosis not present

## 2022-01-24 DIAGNOSIS — Z951 Presence of aortocoronary bypass graft: Secondary | ICD-10-CM | POA: Diagnosis not present

## 2022-01-24 DIAGNOSIS — Z96651 Presence of right artificial knee joint: Secondary | ICD-10-CM

## 2022-01-24 DIAGNOSIS — J309 Allergic rhinitis, unspecified: Secondary | ICD-10-CM

## 2022-01-24 MED ORDER — SILODOSIN 8 MG PO CAPS: 8.0000 mg | ORAL_CAPSULE | Freq: Every day | ORAL | 3 refills | Status: DC

## 2022-01-24 MED ORDER — AMLODIPINE BESYLATE 5 MG PO TABS: 5.0000 mg | ORAL_TABLET | Freq: Every day | ORAL | 3 refills | Status: DC

## 2022-01-24 MED ORDER — ATORVASTATIN CALCIUM 20 MG PO TABS: 20.0000 mg | ORAL_TABLET | Freq: Every day | ORAL | 3 refills | Status: DC

## 2022-01-24 MED ORDER — MONTELUKAST SODIUM 10 MG PO TABS: 10.0000 mg | ORAL_TABLET | Freq: Every day | ORAL | 3 refills | Status: DC

## 2022-01-24 MED ORDER — ALBUTEROL SULFATE HFA 108 (90 BASE) MCG/ACT IN AERS: INHALATION_SPRAY | RESPIRATORY_TRACT | 1 refills | Status: DC

## 2022-01-24 MED ORDER — FLUTICASONE-SALMETEROL 500-50 MCG/ACT IN AEPB: 1.0000 | INHALATION_SPRAY | Freq: Two times a day (BID) | RESPIRATORY_TRACT | 3 refills | Status: DC

## 2022-01-24 MED ORDER — METOPROLOL TARTRATE 25 MG PO TABS: 25.0000 mg | ORAL_TABLET | Freq: Two times a day (BID) | ORAL | 3 refills | Status: DC

## 2022-01-24 NOTE — Progress Notes (Signed)
[name] Long is a 82 y.o. female who presents for annual wellness visit ,CPE and follow-up on chronic medical conditions.  She has no particular concerns or questions.  She is scheduled to follow-up concerning her renal cancer and possible recurrence of that.  She has had some difficulty with laryngospasm and presently is taking Nexium for her reflux and also to help with that.  She continues to be followed by Dr. Hardin Negus for her pain management.  She is on Rapaflo for treatment of her lipids and follows up regularly with cardiology.  Her allergies seem to be under good control.  She continues on her asthma medications.  She has had difficulty in the past with dizziness and did recently see neurology and was again told that there was not much that can be done.  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  She does have a history of osteopenia and continues on vitamin D and calcium.  Immunizations and Health Maintenance Immunization History  Administered Date(s) Administered   Fluad Quad(high Dose 65+) 08/22/2018, 10/13/2019   Influenza, High Dose Seasonal PF 10/18/2013, 12/01/2016, 10/01/2017, 09/09/2021   Influenza,inj,Quad PF,6+ Mos 09/23/2012   Influenza-Unspecified 12/04/2012, 10/07/2013, 11/02/2013, 09/17/2014, 10/17/2014, 10/22/2015, 12/02/2016, 09/29/2020   PFIZER Comirnaty(Gray Top)Covid-19 Tri-Sucrose Vaccine 10/11/2021   PFIZER(Purple Top)SARS-COV-2 Vaccination 02/06/2019, 03/03/2019, 10/13/2019   Pfizer Covid-19 Vaccine Bivalent Booster 58yr & up 11/08/2020   Pneumococcal Conjugate-13 04/08/2015, 02/17/2016   Pneumococcal Polysaccharide-23 11/19/2002, 09/23/2012, 12/04/2012   Tdap 07/19/2009, 01/13/2020   Zoster Recombinat (Shingrix) 08/09/2016, 11/27/2016   Zoster, Live 04/16/2008, 03/30/2010   There are no preventive care reminders to display for this patient.   Last Pap smear: hysterectomy Last mammogram:  03/15/2020 Last colonoscopy: 2015 Last DEXA:  09/30/2019 Dentist: within the last year Ophtho:within the last year Exercise: usually 3 times a week  Other doctors caring for patient include:Dr BGwenlyn FoundDr MLake BellsDr CChristella NoaDr. WLovena Neighbours  Oncology  Advanced directives: on file    Depression screen:  See questionnaire below.     01/17/2022   10:31 AM 12/16/2020    8:18 AM 12/11/2019    8:32 AM 12/05/2018   12:20 PM 10/01/2017    9:34 AM  Depression screen PHQ 2/9  Decreased Interest 0 0 0 0 0  Down, Depressed, Hopeless 0 0 0 0 0  PHQ - 2 Score 0 0 0 0 0  Altered sleeping 0      Tired, decreased energy 0      Change in appetite 0      Feeling bad or failure about yourself  0      Trouble concentrating 0      Moving slowly or fidgety/restless 0      Suicidal thoughts 0      PHQ-9 Score 0      Difficult doing work/chores Not difficult at all        Fall Risk Screen: see questionnaire below.    01/17/2022   10:31 AM 12/31/2021   10:27 AM 12/16/2021   12:01 PM 11/21/2021    8:43 AM 12/16/2020    8:17 AM  Fall Risk   Falls in the past year? 0 0 0 0 0  Number falls in past yr: 0 0 0 0 0  Injury with Fall? 0 0 0 0 0  Risk for fall due to : Medication side effect    No Fall Risks  Follow up Falls prevention discussed;Education provided;Falls evaluation completed   Falls evaluation completed  ADL screen:  See questionnaire below Functional Status Survey:     Review of Systems Constitutional: -, -unexpected weight change, -anorexia, -fatigue Allergy: -sneezing, -itching, -congestion Dermatology: denies changing moles, rash, lumps ENT: -runny nose, -ear pain, -sore throat,  Cardiology:  -chest pain, -palpitations, -orthopnea, Respiratory: -cough, -shortness of breath, -dyspnea on exertion, -wheezing,  Gastroenterology: -abdominal pain, -nausea, -vomiting, -diarrhea, -constipation, -dysphagia Hematology: -bleeding or bruising problems Musculoskeletal: -arthralgias, -myalgias, -joint swelling, -back pain,  - Ophthalmology: -vision changes,  Urology: -dysuria, -difficulty urinating,  -urinary frequency, -urgency, incontinence Neurology: -, -numbness, , -memory loss, -falls, -dizziness    PHYSICAL EXAM:  BP 130/80   Pulse 80   Temp 97.9 F (36.6 C) (Oral)   Ht 5' 3.5" (1.613 m)   Wt 185 lb (83.9 kg)   SpO2 95% Comment: room air  BMI 32.26 kg/m   General Appearance: Alert, cooperative, no distress, appears stated age Head: Normocephalic, without obvious abnormality, atraumatic Eyes: PERRL, conjunctiva/corneas clear, EOM's intact,  Ears: Normal TM's and external ear canals Nose: Nares normal, mucosa normal, no drainage or sinus tenderness Throat: Lips, mucosa, and tongue normal; teeth and gums normal Neck: Supple, no lymphadenopathy;  thyroid:  no enlargement/tenderness/nodules; no carotid bruit or JVD Lungs: Clear to auscultation bilaterally without wheezes, rales or ronchi; respirations unlabored Heart: Regular rate and rhythm, S1 and S2 normal, no murmur, rubor gallop Abdomen: Soft, non-tender, nondistended, normoactive bowel sounds,  no masses, no hepatosplenomegaly Extremities: No clubbing, cyanosis or edema Pulses: 2+ and symmetric all extremities Skin:  Skin color, texture, turgor normal, no rashes or lesions Lymph nodes: Cervical, supraclavicular, and axillary nodes normal Neurologic:  CNII-XII intact, normal strength, sensation and gait; reflexes 2+ and symmetric throughout Psych: Normal mood, affect, hygiene and grooming.  ASSESSMENT/PLAN: Routine general medical examination at a health care facility  Aortic atherosclerosis [facility])  Coronary artery disease involving coronary bypass graft of native heart with angina pectoris ([place]) - Plan: metoprolol tartrate (LOPRESSOR) 25 MG tablet  Essential hypertension - Plan: amLODipine (NORVASC) 5 MG tablet, metoprolol tartrate (LOPRESSOR) 25 MG tablet  Allergic rhinitis, unspecified seasonality, unspecified trigger  Mild  persistent chronic asthma without complication  Gastroesophageal reflux disease without esophagitis  High frequency hearing loss, unspecified laterality  Rheumatoid arthritis of both hips, unspecified whether rheumatoid factor present ([place])  Osteopenia, unspecified location  History of renal cell cancer  Hyperlipidemia with target LDL less than 70 - Plan: atorvastatin (LIPITOR) 20 MG tablet, Lipid panel  S/P CABG x 3: (lima-lad,svg-om,svg-rca) - Plan: atorvastatin (LIPITOR) 20 MG tablet  S/P TKR (total knee replacement), right  Vitamin D deficiency - Plan: VITAMIN D 25 Hydroxy (Vit-D Deficiency, Fractures)  Fibromyalgia  Asthma, chronic, mild persistent, uncomplicated - Plan: albuterol (PROVENTIL HFA) 108 (90 Base) MCG/ACT inhaler, fluticasone-salmeterol (ADVAIR DISKUS) 500-50 MCG/ACT AEPB, montelukast (SINGULAIR) 10 MG tablet  Urinary retention - Plan: silodosin (RAPAFLO) 8 MG CAPS capsule  Continue on present medication regimen.  I did renew multiple medications.  She will continue to be followed by the various specialist and call me if there is any questions  Immunization recommendations discussed.   Medicare Attestation I have personally reviewed: The patient's medical and social history Their use of alcohol, tobacco or illicit drugs Their current medications and supplements The patient's functional ability including ADLs,fall risks, home safety risks, cognitive, and hearing and visual impairment Diet and physical activities Evidence for depression or mood disorders  The patient's weight, height, and BMI have been recorded in the chart.  I have made referrals, counseling,  and provided education to the patient based on review of the above and I have provided the patient with a written personalized care plan for preventive services.     Jill Alexanders, MD   01/24/2022

## 2022-01-25 LAB — LIPID PANEL
Chol/HDL Ratio: 1.4 ratio (ref 0.0–4.4)
Cholesterol, Total: 119 mg/dL (ref 100–199)
HDL: 88 mg/dL (ref >39)
LDL Chol Calc (NIH): 20 mg/dL (ref 0–99)
Triglycerides: 45 mg/dL (ref 0–149)
VLDL Cholesterol Cal: 11 mg/dL (ref 5–40)

## 2022-01-25 LAB — VITAMIN D 25 HYDROXY (VIT D DEFICIENCY, FRACTURES): Vit D, 25-Hydroxy: 41.6 ng/mL (ref 30.0–100.0)

## 2022-02-03 ENCOUNTER — Ambulatory Visit
Admission: RE | Admit: 2022-02-03 | Discharge: 2022-02-03 | Disposition: A | Discharge status: Home / Self Care | Payer: PPO | Source: Ambulatory Visit | Attending: Urology | Admitting: Urology

## 2022-02-03 ENCOUNTER — Other Ambulatory Visit: Payer: Self-pay | Admitting: Urology

## 2022-02-03 DIAGNOSIS — C641 Malignant neoplasm of right kidney, except renal pelvis: Secondary | ICD-10-CM

## 2022-02-07 ENCOUNTER — Ambulatory Visit: Payer: PPO | Admitting: Pulmonary Disease

## 2022-02-07 ENCOUNTER — Other Ambulatory Visit (HOSPITAL_COMMUNITY): Payer: Self-pay

## 2022-02-07 ENCOUNTER — Encounter: Payer: Self-pay | Admitting: Pulmonary Disease

## 2022-02-07 ENCOUNTER — Ambulatory Visit (INDEPENDENT_AMBULATORY_CARE_PROVIDER_SITE_OTHER): Payer: PPO | Admitting: Pulmonary Disease

## 2022-02-07 VITALS — BP 136/82 | HR 83 | Ht 63.5 in | Wt 184.4 lb

## 2022-02-07 DIAGNOSIS — J45909 Unspecified asthma, uncomplicated: Secondary | ICD-10-CM

## 2022-02-07 DIAGNOSIS — J4489 Other specified chronic obstructive pulmonary disease: Secondary | ICD-10-CM

## 2022-02-07 DIAGNOSIS — J385 Laryngeal spasm: Secondary | ICD-10-CM

## 2022-02-07 DIAGNOSIS — R0609 Other forms of dyspnea: Secondary | ICD-10-CM | POA: Diagnosis not present

## 2022-02-07 LAB — PULMONARY FUNCTION TEST
DL/VA % pred: 91 %
DL/VA: 3.72 ml/min/mmHg/L
DLCO cor % pred: 67 %
DLCO cor: 13.1 ml/min/mmHg
DLCO unc % pred: 67 %
DLCO unc: 13.1 ml/min/mmHg
FEF 25-75 Post: 1 L/sec
FEF 25-75 Pre: 0.88 L/sec
FEF2575-%Change-Post: 13 %
FEF2575-%Pred-Post: 68 %
FEF2575-%Pred-Pre: 60 %
FEV1-%Change-Post: 13 %
FEV1-%Pred-Post: 74 %
FEV1-%Pred-Pre: 66 %
FEV1-Post: 1.52 L
FEV1-Pre: 1.34 L
FEV1FVC-%Change-Post: 10 %
FEV1FVC-%Pred-Pre: 86 %
FEV6-%Change-Post: 7 %
FEV6-%Pred-Post: 82 %
FEV6-%Pred-Pre: 76 %
FEV6-Post: 2.13 L
FEV6-Pre: 1.99 L
FEV6FVC-%Change-Post: 0 %
FEV6FVC-%Pred-Post: 105 %
FEV6FVC-%Pred-Pre: 105 %
FVC-%Change-Post: 1 %
FVC-%Pred-Post: 78 %
FVC-%Pred-Pre: 77 %
FVC-Post: 2.15 L
FVC-Pre: 2.11 L
Post FEV1/FVC ratio: 71 %
Post FEV6/FVC ratio: 100 %
Pre FEV1/FVC ratio: 64 %
Pre FEV6/FVC Ratio: 100 %
RV % pred: 106 %
RV: 2.6 L
TLC % pred: 90 %
TLC: 4.71 L

## 2022-02-07 MED ORDER — BREZTRI AEROSPHERE 160-9-4.8 MCG/ACT IN AERO: 2.0000 | INHALATION_SPRAY | Freq: Two times a day (BID) | RESPIRATORY_TRACT | 5 refills | Status: DC

## 2022-02-07 NOTE — Progress Notes (Signed)
Full PFT completed today 

## 2022-02-07 NOTE — Progress Notes (Signed)
Synopsis: Referred in December 2023 for dyspnea, has laryngospasm and a prior history of asthma.  Also has coronary artery disease and had coronary artery bypass grafting in 2013.  Quit smoking 1982, smoked 2-3 packs per day. Lung function testing showed reversible airflow obstruction and diminished diffusion capacity.  Subjective:   PATIENT ID: [name] Long GENDER: female DOB: 1940/08/23, MRN: [id number]   HPI  Chief Complaint  Patient presents with   Follow-up    PFT performed today.  Pt is still having problems with laryngospasm since last visit.   [name] Long continues to struggle with episodes of laryngospasm.  She has had 4 episodes since the last visit.  She continues to take a dry powder inhaler with Advair twice daily.  She has not had bronchitis or pneumonia.  She had lung function testing today and shortly thereafter she had an episode of laryngospasm here in our office.  Past Medical History:  Diagnosis Date   Abnormal EKG 01/12/2011   Allergy    RHINITIS   Arthritis    neck, back knees, hips   Asthma    Asthma 01/12/2011   Back pain, chronic--plans were for cervical disc surgery week of 01/16/11 01/12/2011   Blood clot in vein 2004   L leg   Blood transfusion without reported diagnosis 2004   Chronic low back pain    Complication of anesthesia    post op bladder retention after surgery   Connective tissue disease ([place])    Coronary artery disease    s/p CABG x 3 in 2013, hx of PCI with DES   Crescendo angina, with EKG changes, new. 01/12/2011   Dyspnea    Fibromyalgia    Fibromyalgia    GERD (gastroesophageal reflux disease)    Headache    migraine - several in a yr.    History of stress test 04/2011   Normal stress test   Hx of echocardiogram 07/19/2011   EF 50-55%. There is mild annular calcification, there is trace migtral regurgitation, there is trace tricupid regurgitation, pericardium is thick, there is no pericardial effusion.   Hyperlipidemia     Hypertension    Myocardial infarction [facility]) 2013   triple heart bypass   Neuromuscular disorder (HCC)    fibromyalgia    Osteoporosis    OSTEOPENIA   Personal history of colonic polyp - adenoma 08/05/2013   08/2013 - 7 mm adenoma - consider repeat colonoscopy 2020 (age 18)   Pneumonia 2018   numerous times, only admitted one time for pneumonia but she has had pneumonia numerous times;   02-19-2018 END OF NOVEMBER 2019  WAS ADMITTED FOR ABOUT  A WEEK DUE TO "DOUBLE PNEUMONIA", REPORTS TODAY FEELING BACK TO HER NORMAL , DENIES SOB, O2 SAT AT 59 ROOM AIR , AFEBRILE, OTHER VSS    PONV (postoperative nausea and vomiting)    PPD positive    Right carotid bruit    Status post coronary artery bypass grafting    Urinary incontinence    post op, "sleepy bladder", urinar retention      Review of Systems  Constitutional:  Negative for chills, fever, malaise/fatigue and weight loss.  HENT:  Negative for congestion, sinus pain and sore throat.   Respiratory:  Positive for shortness of breath. Negative for cough and sputum production.   Cardiovascular:  Negative for chest pain and leg swelling.     Objective:  Physical Exam   Vitals:   02/07/22 1003  BP: 136/82  Pulse: 83  SpO2: 97%  Weight: 184 lb 6.4 oz (83.6 kg)  Height: 5' 3.5" (1.613 m)    Gen: well appearing HENT: OP clear, neck supple PULM: CTA B, normal effort, some upper airway wheeze CV: RRR, no mgr GI: BS+, soft, nontender Derm: no cyanosis or rash Psyche: normal mood and affect    CBC    Component Value Date/Time   WBC 7.0 09/19/2021 1604   WBC 7.6 09/03/2020 0225   RBC 4.51 09/19/2021 1604   RBC 3.87 09/03/2020 0225   HGB 13.0 09/19/2021 1604   HCT 39.9 09/19/2021 1604   PLT 195 09/19/2021 1604   MCV 89 09/19/2021 1604   MCH 28.8 09/19/2021 1604   MCH 29.2 09/03/2020 0225   MCHC 32.6 09/19/2021 1604   MCHC 31.7 09/03/2020 0225   RDW 12.9 09/19/2021 1604   LYMPHSABS 1.4 12/16/2020 0933   MONOABS 0.4  05/25/2020 1147   EOSABS 0.2 12/16/2020 0933   BASOSABS 0.0 12/16/2020 0933     Chest imaging: October 2023 chest x-ray shows sternal wires in place, status post coronary artery bypass grafting, normal pulmonary parenchyma, normal cardiac silhouette, no pulmonary parenchymal abnormality, personally reviewed  PFT: December 2020 FENO 20 ppb February 2024 full PFT ratio 64%, FEV1 1.34 L 66% predicted, improved to 1.52 L 13% change with bronchodilator, review of data appears adequate, total lung capacity 4.71 L 90% predicted, DLCO 13.10 67% predicted  Labs: September 2023 hemoglobin 13.0 g/dL, 200K cell/uL eosinophils  Path:  Echo: October 2023 TTE LVEF 55 to 02%, grade 2 diastolic dysfunction, RV systolic function normal with RV enlargement mild, left atrial mildly dilated, right atrium mildly dilated  Heart Catheterization:  Ambulatory oximetry: December 2023 walked 500 feet in the office on room air and O2 saturation dropped no lower than 95%     Assessment & Plan:   Laryngospasm - Plan: Ambulatory referral to ENT  Asthma, unspecified asthma severity, unspecified whether complicated, unspecified whether persistent  COPD with asthma  Discussion: Angell has COPD asthma overlap complicated by laryngospasm.  I believe the dry powder inhaler is contributing to her laryngospasm.  Plan: Laryngospasm: Stop Advair Start Breztri We will refer you to Dr. Ernestine Conrad for further evaluation and management of laryngospasm  COPD asthma overlap syndrome: Stop Advair Start Breztri Use albuterol as needed for chest tightness wheezing or shortness of breath Practice good hand hygiene Stay physically active  Follow-up with me in 3 months, sooner if needed  Immunizations: Immunization History  Administered Date(s) Administered   Fluad Quad(high Dose 65+) 08/22/2018, 10/13/2019   Influenza, High Dose Seasonal PF 10/18/2013, 12/01/2016, 10/01/2017, 09/09/2021    Influenza,inj,Quad PF,6+ Mos 09/23/2012   Influenza-Unspecified 12/04/2012, 10/07/2013, 11/02/2013, 09/17/2014, 10/17/2014, 10/22/2015, 12/02/2016, 09/29/2020   PFIZER Comirnaty(Gray Top)Covid-19 Tri-Sucrose Vaccine 10/11/2021   PFIZER(Purple Top)SARS-COV-2 Vaccination 02/06/2019, 03/03/2019, 10/13/2019   Pfizer Covid-19 Vaccine Bivalent Booster 61yr & up 11/08/2020   Pneumococcal Conjugate-13 04/08/2015, 02/17/2016   Pneumococcal Polysaccharide-23 11/19/2002, 09/23/2012, 12/04/2012   Tdap 07/19/2009, 01/13/2020   Zoster Recombinat (Shingrix) 08/09/2016, 11/27/2016   Zoster, Live 04/16/2008, 03/30/2010     Current Outpatient Medications:    albuterol (PROVENTIL HFA) 108 (90 Base) MCG/ACT inhaler, INHALE 2 PUFF AS DIRECTED FOUR TIMES A DAY AS NEEDED, Disp: 6.7 g, Rfl: 1   amLODipine (NORVASC) 5 MG tablet, Take 1 tablet (5 mg total) by mouth daily., Disp: 90 tablet, Rfl: 3   aspirin EC 81 MG tablet, Take 81 mg by mouth daily. Swallow whole., Disp: , Rfl:  atorvastatin (LIPITOR) 20 MG tablet, Take 1 tablet (20 mg total) by mouth daily., Disp: 90 tablet, Rfl: 3   Budeson-Glycopyrrol-Formoterol (BREZTRI AEROSPHERE) 160-9-4.8 MCG/ACT AERO, Inhale 2 puffs into the lungs in the morning and at bedtime., Disp: 10.7 g, Rfl: 5   Calcium Carb-Cholecalciferol (CALTRATE 600+D3 PO), Take 1 tablet by mouth daily., Disp: , Rfl:    carisoprodol (SOMA) 350 MG tablet, Take 350 mg by mouth every 8 (eight) hours as needed (for arthritis pain). , Disp: , Rfl:    clopidogrel (PLAVIX) 75 MG tablet, Take 1 tablet (75 mg total) by mouth daily with breakfast., Disp: 90 tablet, Rfl: 3   Evolocumab (REPATHA SURECLICK) 754 MG/ML SOAJ, Inject 140 mg into the skin every 14 (fourteen) days., Disp: 6 mL, Rfl: 3   gabapentin (NEURONTIN) 600 MG tablet, Take 1,200-1,800 mg by mouth See admin instructions. Take 2 tablets (1200 mg) by mouth in the morning & take 3 tablets (1800 mg) by mouth at night, Disp: , Rfl:    HYDROmorphone  (DILAUDID) 4 MG tablet, Take 1 tablet (4 mg total) by mouth every 4 (four) hours as needed for severe pain., Disp: 10 tablet, Rfl: 0   magnesium oxide (MAG-OX) 400 MG tablet, Take 400 mg by mouth at bedtime., Disp: , Rfl:    metoprolol tartrate (LOPRESSOR) 25 MG tablet, Take 1 tablet (25 mg total) by mouth 2 (two) times daily., Disp: 180 tablet, Rfl: 3   montelukast (SINGULAIR) 10 MG tablet, Take 1 tablet (10 mg total) by mouth daily., Disp: 90 tablet, Rfl: 3   Multiple Vitamins-Minerals (PRESERVISION AREDS 2 PO), Take 1 tablet by mouth in the morning and at bedtime., Disp: , Rfl:    nitroGLYCERIN (NITROSTAT) 0.4 MG SL tablet, Place 1 tablet (0.4 mg total) under the tongue every 5 (five) minutes x 3 doses as needed for chest pain., Disp: 25 tablet, Rfl: 1   silodosin (RAPAFLO) 8 MG CAPS capsule, Take 1 capsule (8 mg total) by mouth daily., Disp: 90 capsule, Rfl: 3   VITAMIN D PO, Take by mouth., Disp: , Rfl:

## 2022-02-07 NOTE — Patient Instructions (Signed)
Laryngospasm: Stop Advair Start [place] We will refer you to Dr. Ernestine Conrad for further evaluation and management of laryngospasm  COPD asthma overlap syndrome: Stop Advair Start Breztri Use albuterol as needed for chest tightness wheezing or shortness of breath Practice good hand hygiene Stay physically active  Follow-up with me in 3 months, sooner if needed

## 2022-02-08 DIAGNOSIS — Z85528 Personal history of other malignant neoplasm of kidney: Secondary | ICD-10-CM | POA: Diagnosis not present

## 2022-02-08 DIAGNOSIS — N289 Disorder of kidney and ureter, unspecified: Secondary | ICD-10-CM | POA: Diagnosis not present

## 2022-02-08 DIAGNOSIS — Z9882 Breast implant status: Secondary | ICD-10-CM | POA: Diagnosis not present

## 2022-02-08 DIAGNOSIS — C641 Malignant neoplasm of right kidney, except renal pelvis: Secondary | ICD-10-CM | POA: Diagnosis not present

## 2022-02-08 DIAGNOSIS — I700 Atherosclerosis of aorta: Secondary | ICD-10-CM | POA: Diagnosis not present

## 2022-02-16 DIAGNOSIS — D49511 Neoplasm of unspecified behavior of right kidney: Secondary | ICD-10-CM | POA: Diagnosis not present

## 2022-02-16 DIAGNOSIS — C641 Malignant neoplasm of right kidney, except renal pelvis: Secondary | ICD-10-CM | POA: Diagnosis not present

## 2022-02-16 DIAGNOSIS — N312 Flaccid neuropathic bladder, not elsewhere classified: Secondary | ICD-10-CM | POA: Diagnosis not present

## 2022-02-21 ENCOUNTER — Encounter: Payer: Self-pay | Admitting: Cardiovascular Disease

## 2022-02-21 ENCOUNTER — Ambulatory Visit: Payer: PPO | Attending: Cardiovascular Disease | Admitting: Cardiovascular Disease

## 2022-02-21 VITALS — BP 122/70 | HR 94 | Ht 64.5 in | Wt 182.0 lb

## 2022-02-21 DIAGNOSIS — E785 Hyperlipidemia, unspecified: Secondary | ICD-10-CM

## 2022-02-21 DIAGNOSIS — I10 Essential (primary) hypertension: Secondary | ICD-10-CM

## 2022-02-21 DIAGNOSIS — R339 Retention of urine, unspecified: Secondary | ICD-10-CM | POA: Diagnosis not present

## 2022-02-21 DIAGNOSIS — Z951 Presence of aortocoronary bypass graft: Secondary | ICD-10-CM | POA: Diagnosis not present

## 2022-02-21 NOTE — Assessment & Plan Note (Signed)
History of hyperlipidemia on high-dose statin therapy lipid profile performed 01/24/2022 revealing a total cholesterol 119, LDL 28 and HDL of 88.

## 2022-02-21 NOTE — Assessment & Plan Note (Signed)
History of CAD status post coronary bypass grafting January 2013 by Dr. Dahlia Byes with a LIMA to her LAD, vein relaxes marginal branch and right coronary artery.  She had cardiac catheterization performed 08/31/2020 by Dr. Tamala Julian revealing high-grade distal left main disease with occluded LAD after the first diagonal branch, patent LIMA to the LAD, patent vein to an obtuse marginal branch and an occluded vein to the RCA with high-grade calcified proximal dominant RCA stenosis on a band.  On 09/02/2020 she underwent complex IVUS guided orbital atherectomy with shockwave angioplasty and drug-eluting stenting of her RCA by Dr. [place] with excellent result.  She remains on dual antiplatelet therapy.  She denies chest pain.  She apparently needs C3-4  for intervention by Dr. Christella Noa which I think she can undergo at low risk and can interrupt her antiplatelet therapy.

## 2022-02-21 NOTE — Progress Notes (Signed)
02/21/2022 [name]   1940/11/22  [id number]  Primary Physician Denita Lung, MD Primary Cardiologist: Lorretta Harp MD FACP, [place], [place], [place]  HPI:  [name] Long is a 82 y.o.  mildly overweight married Caucasian female, mother of 2, grandmother to 6 grandchildren, who I last saw in the office 02/23/2021.Marland Kitchen She ended up being admitted January 12, 2011, with chest pain and was catheterized, revealing a left main 3-vessel disease with an EF of 50% and high anterior hypokinesia, for which she underwent coronary artery bypass grafting by Dr. Tharon Aquas Trigt with a LIMA to her LAD, vein to an obtuse marginal branch and to the right coronary artery. Postop course was uncomplicated. A Myoview in April was normal. Her lipid profile was excellent. She saw Cecilie Kicks in our office on July 15 complaining of chest pain, and the concern was musculoskeletal versus pericarditis. A 2D echocardiogram was entirely normal. She ended up seeing Dr. Prescott Gum, who diagnosed a staph sternal wound infection, requiring hospitalization and debridement. She had a wound VAC on and fortunately has completely healed. Since I saw her in the office a year and a half ago she was complaining of some increased dyspnea on exertion and decreased exercise tolerance.  A Myoview stress test was normal as was a 2D echocardiogram.  It turns out that her increasing shortness of breath is probably related to exacerbation of her asthma at that time.    She has been diagnosed with renal cell carcinoma and is scheduled to undergo laparoscopic partial nephrectomy by Dr. Gilford Rile at [facility] urology.   She underwent meniscus repair by Dr. Latanya Maudlin  of her right knee which did not result in an acceptable clinical outcome and therefore she underwent right total knee replacement. Her last Myoview stress test performed 12/28/2015 was low risk and nonischemic.   She apparently has a new lesion on her kidney which may require surgical  evaluation in the future.  She was complaining of some dyspnea which is new since I saw her a year ago but denies chest pain.  She was admitted on 08/31/2020 with chest pain and shortness of breath.  She underwent cardiac catheterization by Dr. Tamala Julian on that day revealing high-grade distal left main disease with an occluded LAD after the first diagonal branch, patent LIMA to the LAD, patent vein to obtuse marginal branch, occluded vein to the RCA and high-grade calcified proximal dominant RCA stenosis on a bend.  On 09/02/2020 she underwent complex IVUS guided orbital atherectomy with shockwave angioplasty and drug-eluting stenting of the RCA by Dr. Martinique with an excellent result.  She was discharged home the following day.  She thinks that her shortness of breath had improved somewhat after that.  Since I saw her a year ago she is remained stable.  Apparently her renal cell carcinoma has returned and she needs "cryoablation".  She also needs C3/C4 laminectomy by Dr. Christella Noa in the upcoming future.  She was seeing Dr. Lake Bells because of shortness of breath and diagnosed her with asthma/COPD overlap.  She did have a 2D echo performed 10/06/2021 revealing normal LV systolic function with grade 2 diastolic dysfunction and no significant valvular abnormalities.   Current Meds  Medication Sig   albuterol (PROVENTIL HFA) 108 (90 Base) MCG/ACT inhaler INHALE 2 PUFF AS DIRECTED FOUR TIMES A DAY AS NEEDED   amLODipine (NORVASC) 5 MG tablet Take 1 tablet (5 mg total) by mouth daily.   aspirin EC 81 MG tablet  Take 81 mg by mouth daily. Swallow whole.   atorvastatin (LIPITOR) 20 MG tablet Take 1 tablet (20 mg total) by mouth daily.   Budeson-Glycopyrrol-Formoterol (BREZTRI AEROSPHERE) 160-9-4.8 MCG/ACT AERO Inhale 2 puffs into the lungs in the morning and at bedtime.   Calcium Carb-Cholecalciferol (CALTRATE 600+D3 PO) Take 1 tablet by mouth daily.   carisoprodol (SOMA) 350 MG tablet Take 350 mg by mouth every 8  (eight) hours as needed (for arthritis pain).    clopidogrel (PLAVIX) 75 MG tablet Take 1 tablet (75 mg total) by mouth daily with breakfast.   esomeprazole (NEXIUM) 20 MG packet Take 20 mg by mouth daily before breakfast.   Evolocumab (REPATHA SURECLICK) [id number] MG/ML SOAJ Inject 140 mg into the skin every 14 (fourteen) days.   gabapentin (NEURONTIN) 600 MG tablet Take 1,200-1,800 mg by mouth See admin instructions. Take 2 tablets (1200 mg) by mouth in the morning & take 3 tablets (1800 mg) by mouth at night   HYDROmorphone (DILAUDID) 4 MG tablet Take 1 tablet (4 mg total) by mouth every 4 (four) hours as needed for severe pain.   magnesium oxide (MAG-OX) 400 MG tablet Take 400 mg by mouth at bedtime.   metoprolol tartrate (LOPRESSOR) 25 MG tablet Take 1 tablet (25 mg total) by mouth 2 (two) times daily.   montelukast (SINGULAIR) 10 MG tablet Take 1 tablet (10 mg total) by mouth daily.   Multiple Vitamins-Minerals (PRESERVISION AREDS 2 PO) Take 1 tablet by mouth in the morning and at bedtime.   nitroGLYCERIN (NITROSTAT) 0.4 MG SL tablet Place 1 tablet (0.4 mg total) under the tongue every 5 (five) minutes x 3 doses as needed for chest pain.   silodosin (RAPAFLO) 8 MG CAPS capsule Take 1 capsule (8 mg total) by mouth daily.   VITAMIN D PO Take by mouth.     Allergies  Allergen Reactions   Dimenhydrinate Other (See Comments)    "locks up kidneys"   Morphine And Related Nausea And Vomiting   Percocet [Oxycodone-Acetaminophen] Other (See Comments)    hallucinations   Zolpidem Other (See Comments)    Unusual behavior    Social History   Socioeconomic History   Marital status: Married    Spouse name: Not on file   Number of children: Not on file   Years of education: Not on file   Highest education level: Not on file  Occupational History   Not on file  Tobacco Use   Smoking status: Former    Types: Cigarettes    Quit date: 01/12/1980    Years since quitting: 42.1   Smokeless tobacco:  Never  Vaping Use   Vaping Use: Never used  Substance and Sexual Activity   Alcohol use: No    Alcohol/week: 0.0 standard drinks of alcohol   Drug use: No   Sexual activity: Yes  Other Topics Concern   Not on file  Social History Narrative   Right handed   Social Determinants of Health   Financial Resource Strain: Low Risk  (01/17/2022)   Overall Financial Resource Strain (CARDIA)    Difficulty of Paying Living Expenses: Not hard at all  Food Insecurity: No Food Insecurity (01/17/2022)   Hunger Vital Sign    Worried About Running Out of Food in the Last Year: Never true    Ran Out of Food in the Last Year: Never true  Transportation Needs: No Transportation Needs (01/17/2022)   PRAPARE - Hydrologist (Medical): No  Lack of Transportation (Non-Medical): No  Physical Activity: Insufficiently Active (01/17/2022)   Exercise Vital Sign    Days of Exercise per Week: 2 days    Minutes of Exercise per Session: 40 min  Stress: No Stress Concern Present (01/17/2022)   [place]    Feeling of Stress : Not at all  Social Connections: Not on file  Intimate Partner Violence: Not on file     Review of Systems: General: negative for chills, fever, night sweats or weight changes.  Cardiovascular: negative for chest pain, dyspnea on exertion, edema, orthopnea, palpitations, paroxysmal nocturnal dyspnea or shortness of breath Dermatological: negative for rash Respiratory: negative for cough or wheezing Urologic: negative for hematuria Abdominal: negative for nausea, vomiting, diarrhea, bright red blood per rectum, melena, or hematemesis Neurologic: negative for visual changes, syncope, or dizziness All other systems reviewed and are otherwise negative except as noted above.    Blood pressure 122/70, pulse 94, height 5' 4.5" (1.638 m), weight 182 lb (82.6 kg), SpO2 92 %.  General appearance:  alert and no distress Neck: no adenopathy, no carotid bruit, no JVD, supple, symmetrical, trachea midline, and thyroid not enlarged, symmetric, no tenderness/mass/nodules Lungs: clear to auscultation bilaterally Heart: regular rate and rhythm, S1, S2 normal, no murmur, click, rub or gallop Extremities: extremities normal, atraumatic, no cyanosis or edema Pulses: 2+ and symmetric Skin: Skin color, texture, turgor normal. No rashes or lesions Neurologic: Grossly normal  EKG sinus rhythm at 94 with incomplete right bundle branch block and inferior and anterolateral T wave inversion unchanged from prior EKGs.  I personally reviewed this EKG.  ASSESSMENT AND PLAN:   S/P CABG x 3: (lima-lad,svg-om,svg-rca) History of CAD status post coronary bypass grafting January 2013 by Dr. Dahlia Byes with a LIMA to her LAD, vein relaxes marginal branch and right coronary artery.  She had cardiac catheterization performed 08/31/2020 by Dr. Tamala Julian revealing high-grade distal left main disease with occluded LAD after the first diagonal branch, patent LIMA to the LAD, patent vein to an obtuse marginal branch and an occluded vein to the RCA with high-grade calcified proximal dominant RCA stenosis on a band.  On 09/02/2020 she underwent complex IVUS guided orbital atherectomy with shockwave angioplasty and drug-eluting stenting of her RCA by Dr. Martinique with excellent result.  She remains on dual antiplatelet therapy.  She denies chest pain.  She apparently needs C3-4  for intervention by Dr. Christella Noa which I think she can undergo at low risk and can interrupt her antiplatelet therapy.  Hyperlipidemia with target LDL less than 70 History of hyperlipidemia on high-dose statin therapy lipid profile performed 01/24/2022 revealing a total cholesterol 119, LDL 28 and HDL of 88.  Essential hypertension History of essential hypertension blood pressure measured today 122/70.  She is on amlodipine, and metoprolol.     Lorretta Harp MD FACP,FACC,FAHA, [facility] 02/21/2022 8:21 AM

## 2022-02-21 NOTE — Patient Instructions (Signed)
Medication Instructions:  Your physician recommends that you continue on your current medications as directed. Please refer to the Current Medication list given to you today.  *If you need a refill on your cardiac medications before your next appointment, please call your pharmacy*   Follow-Up: At [facility], you and your health needs are our priority.  As part of our continuing mission to provide you with exceptional heart care, we have created designated Provider Care Teams.  These Care Teams include your primary Cardiologist (physician) and Advanced Practice Providers (APPs -  Physician Assistants and Nurse Practitioners) who all work together to provide you with the care you need, when you need it.  We recommend signing up for the patient portal called "MyChart".  Sign up information is provided on this After Visit Summary.  MyChart is used to connect with patients for Virtual Visits (Telemedicine).  Patients are able to view lab/test results, encounter notes, upcoming appointments, etc.  Non-urgent messages can be sent to your provider as well.   To learn more about what you can do with MyChart, go to [email or web address].    Your next appointment:   6 month(s)  Provider:   Coletta Memos, FNP      Then, Quay Burow, MD will plan to see you again in 12 month(s).

## 2022-02-21 NOTE — Assessment & Plan Note (Signed)
History of essential hypertension blood pressure measured today 122/70.  She is on amlodipine, and metoprolol.

## 2022-02-23 ENCOUNTER — Telehealth: Payer: Self-pay | Admitting: Family Medicine

## 2022-02-23 ENCOUNTER — Other Ambulatory Visit: Payer: Self-pay

## 2022-02-23 DIAGNOSIS — Z780 Asymptomatic menopausal state: Secondary | ICD-10-CM

## 2022-02-23 NOTE — Telephone Encounter (Signed)
Pt called & states her referral for her Bone Density went the [facility] said she needs to have it done at [facility] because that is where her others were done.  Please send to [facility]

## 2022-02-24 ENCOUNTER — Encounter: Payer: Self-pay | Admitting: Urology

## 2022-02-24 ENCOUNTER — Other Ambulatory Visit: Payer: Self-pay | Admitting: Urology

## 2022-02-24 DIAGNOSIS — C641 Malignant neoplasm of right kidney, except renal pelvis: Secondary | ICD-10-CM

## 2022-02-28 ENCOUNTER — Ambulatory Visit
Admission: RE | Admit: 2022-02-28 | Discharge: 2022-02-28 | Disposition: A | Discharge status: Home / Self Care | Payer: PPO | Source: Ambulatory Visit | Attending: Urology | Admitting: Urology

## 2022-02-28 DIAGNOSIS — N289 Disorder of kidney and ureter, unspecified: Secondary | ICD-10-CM | POA: Diagnosis not present

## 2022-02-28 DIAGNOSIS — C641 Malignant neoplasm of right kidney, except renal pelvis: Secondary | ICD-10-CM

## 2022-02-28 NOTE — Consult Note (Signed)
Chief Complaint: Right sided renal lesion  Referring Physician(s): Winter,Christopher Marjory Lies  History of Present Illness: [name] Long is a 82 y.o. female with past medical history significant for asthma, DVT, connective tissue disorder, coronary artery disease (post coronary bypass grafting and coronary stent placement 1 year ago, previously on Plavix, stopping this medication earlier this month), fibromyalgia, hypertension, hyperlipidemia and urinary incontinence,, post right hip and total knee replacements and post robotic assisted right lower pole partial nephrectomy on 02/27/2018, who is seen today in telemedicine consultation for evaluation of slowly growing right upper pole renal lesion worrisome for renal cell carcinoma.  Patient is in her baseline state of fairly well health, currently without complaint.  She remains asymptomatic in regards to this incidentally discovered right-sided renal lesion.  Specifically, no flank pain or hematuria.  No change in appetite or energy level.  No unintentional weight loss.   Past Medical History:  Diagnosis Date   Abnormal EKG 01/12/2011   Allergy    RHINITIS   Arthritis    neck, back knees, hips   Asthma    Asthma 01/12/2011   Back pain, chronic--plans were for cervical disc surgery week of 01/16/11 01/12/2011   Blood clot in vein 2004   L leg   Blood transfusion without reported diagnosis 2004   Chronic low back pain    Complication of anesthesia    post op bladder retention after surgery   Connective tissue disease ([place])    Coronary artery disease    s/p CABG x 3 in 2013, hx of PCI with DES   Crescendo angina, with EKG changes, new. 01/12/2011   Dyspnea    Fibromyalgia    Fibromyalgia    GERD (gastroesophageal reflux disease)    Headache    migraine - several in a yr.    History of stress test 04/2011   Normal stress test   Hx of echocardiogram 07/19/2011   EF 50-55%. There is mild annular calcification, there is trace  migtral regurgitation, there is trace tricupid regurgitation, pericardium is thick, there is no pericardial effusion.   Hyperlipidemia    Hypertension    Myocardial infarction [facility]) 2013   triple heart bypass   Neuromuscular disorder (HCC)    fibromyalgia    Osteoporosis    OSTEOPENIA   Personal history of colonic polyp - adenoma 08/05/2013   08/2013 - 7 mm adenoma - consider repeat colonoscopy 2020 (age 89)   Pneumonia 2018   numerous times, only admitted one time for pneumonia but she has had pneumonia numerous times;   02-19-2018 END OF NOVEMBER 2019  WAS ADMITTED FOR ABOUT  A WEEK DUE TO "DOUBLE PNEUMONIA", REPORTS TODAY FEELING BACK TO HER NORMAL , DENIES SOB, O2 SAT AT 14 ROOM AIR , AFEBRILE, OTHER VSS    PONV (postoperative nausea and vomiting)    PPD positive    Right carotid bruit    Status post coronary artery bypass grafting    Urinary incontinence    post op, "sleepy bladder", urinar retention     Past Surgical History:  Procedure Laterality Date   [place] Left 03/27/2019   Benign fibroaipose tissue no evidence of malignancy   BLADDER REPAIR  1985   BREAST SURGERY  1992   REDUCTION, x3 surgeries overall on breast for augmentation    CARDIAC CATHETERIZATION  2013   CARPAL TUNNEL RELEASE Bilateral 1988, [place]  1992, 1998, 1999   CORONARY ARTERY BYPASS GRAFT  01/14/2011   Procedure: CORONARY ARTERY BYPASS GRAFTING (CABG);  Surgeon: Tharon Aquas Adelene Idler, MD;  Location: [place];  Service: Open Heart Surgery;  Laterality: N/A;  CABG x three, using right greater saphenous vein harvested endoscopically   CORONARY ATHERECTOMY N/A 09/02/2020   Procedure: CORONARY ATHERECTOMY;  Surgeon: Martinique, Peter M, MD;  Location: [place] [facility];  Service: Cardiovascular;  Laterality: N/A;   CORONARY STENT INTERVENTION N/A 09/02/2020   Procedure: CORONARY STENT INTERVENTION;  Surgeon: Martinique, Peter M, MD;  Location: [place] [facility];  Service: Cardiovascular;  Laterality: N/A;   EYE SURGERY Bilateral 1985   radiokerototomy   INTRAVASCULAR LITHOTRIPSY  09/02/2020   Procedure: INTRAVASCULAR LITHOTRIPSY;  Surgeon: Martinique, Peter M, MD;  Location: [place] [facility];  Service: Cardiovascular;;   INTRAVASCULAR ULTRASOUND/IVUS N/A 09/02/2020   Procedure: Intravascular Ultrasound/IVUS;  Surgeon: Martinique, Peter M, MD;  Location: [place] [facility];  Service: Cardiovascular;  Laterality: N/A;   LEFT HEART CATH AND CORS/GRAFTS ANGIOGRAPHY N/A 08/31/2020   Procedure: LEFT HEART CATH AND CORS/GRAFTS ANGIOGRAPHY;  Surgeon: Belva Crome, MD;  Location: [place] [facility];  Service: Cardiovascular;  Laterality: N/A;   LEFT HEART CATHETERIZATION WITH CORONARY ANGIOGRAM N/A 01/13/2011   Procedure: LEFT HEART CATHETERIZATION WITH CORONARY ANGIOGRAM;  Surgeon: Troy Sine, MD;  Location: [facility];  Service: Cardiovascular;  Laterality: N/A;   PUBOVAGINAL SLING  2002   ROBOT ASSISTED LAPAROSCOPIC NEPHRECTOMY Right 02/27/2018   Procedure: XI ROBOTIC ASSISTE PARTIAL NEPHRECTOMY;  Surgeon: Ceasar Mons, MD;  Location: [facility];  Service: Urology;  Laterality: Right;   STERNAL WOUND DEBRIDEMENT  07/27/2011   Procedure: STERNAL WOUND DEBRIDEMENT;  followed by wound vac Surgeon: Ivin Poot, MD;  Location: [place];  Service: Open Heart Surgery;  Laterality: N/A;   TONSILLECTOMY  1952   TOTAL HIP ARTHROPLASTY Right 08/22/2016   Procedure: TOTAL HIP ARTHROPLASTY ANTERIOR APPROACH;  Surgeon: Melrose Nakayama, MD;  Location: [place];  Service: Orthopedics;  Laterality: Right;   TOTAL KNEE ARTHROPLASTY Right 07/22/2019   Procedure: RIGHT TOTAL KNEE ARTHROPLASTY;  Surgeon: Melrose Nakayama, MD;  Location: [facility];  Service: Orthopedics;  Laterality: Right;   TRIGGER FINGER RELEASE Right 2014   4th finger   TROCHANTERIC BURSA EXCISION Right    bursa removed, R hip   TUBAL LIGATION      Allergies: Dimenhydrinate, Morphine and  related, Percocet [oxycodone-acetaminophen], and Zolpidem  Medications: Prior to Admission medications   Medication Sig Start Date End Date Taking? Authorizing Provider  albuterol (PROVENTIL HFA) 108 (90 Base) MCG/ACT inhaler INHALE 2 PUFF AS DIRECTED FOUR TIMES A DAY AS NEEDED 01/24/22   Denita Lung, MD  amLODipine (NORVASC) 5 MG tablet Take 1 tablet (5 mg total) by mouth daily. 01/24/22   Denita Lung, MD  aspirin EC 81 MG tablet Take 81 mg by mouth daily. Swallow whole.    [provider]  atorvastatin (LIPITOR) 20 MG tablet Take 1 tablet (20 mg total) by mouth daily. 01/24/22 01/19/23  Denita Lung, MD  Budeson-Glycopyrrol-Formoterol (BREZTRI AEROSPHERE) 160-9-4.8 MCG/ACT AERO Inhale 2 puffs into the lungs in the morning and at bedtime. 02/07/22   Juanito Doom, MD  Calcium Carb-Cholecalciferol (CALTRATE 600+D3 PO) Take 1 tablet by mouth daily.    [provider]  carisoprodol (SOMA) 350 MG tablet Take 350 mg by mouth every 8 (eight) hours as needed (for arthritis pain).  [provider]  clopidogrel (PLAVIX) 75 MG tablet Take 1 tablet (75 mg total) by mouth daily with breakfast. 12/06/21   Deberah Pelton, NP  esomeprazole (NEXIUM) 20 MG packet Take 20 mg by mouth daily before breakfast.    [provider]  Evolocumab (REPATHA SURECLICK) XX123456 MG/ML SOAJ Inject 140 mg into the skin every 14 (fourteen) days. 12/01/21   Lorretta Harp, MD  gabapentin (NEURONTIN) 600 MG tablet Take 1,200-1,800 mg by mouth See admin instructions. Take 2 tablets (1200 mg) by mouth in the morning & take 3 tablets (1800 mg) by mouth at night    [provider]  HYDROmorphone (DILAUDID) 4 MG tablet Take 1 tablet (4 mg total) by mouth every 4 (four) hours as needed for severe pain. 02/27/18   Debbrah Alar, PA-C  magnesium oxide (MAG-OX) 400 MG tablet Take 400 mg by mouth at bedtime.    [provider]  metoprolol tartrate (LOPRESSOR) 25 MG tablet Take 1  tablet (25 mg total) by mouth 2 (two) times daily. 01/24/22   Denita Lung, MD  montelukast (SINGULAIR) 10 MG tablet Take 1 tablet (10 mg total) by mouth daily. 01/24/22   Denita Lung, MD  Multiple Vitamins-Minerals (PRESERVISION AREDS 2 PO) Take 1 tablet by mouth in the morning and at bedtime.    [provider]  nitroGLYCERIN (NITROSTAT) 0.4 MG SL tablet Place 1 tablet (0.4 mg total) under the tongue every 5 (five) minutes x 3 doses as needed for chest pain. 07/19/21   Lorretta Harp, MD  silodosin (RAPAFLO) 8 MG CAPS capsule Take 1 capsule (8 mg total) by mouth daily. 01/24/22   Denita Lung, MD  VITAMIN D PO Take by mouth.    [provider]     Family History  Adopted: Yes  Problem Relation Age of Onset   Hypertension Brother     Social History   Socioeconomic History   Marital status: Married    Spouse name: Not on file   Number of children: Not on file   Years of education: Not on file   Highest education level: Not on file  Occupational History   Not on file  Tobacco Use   Smoking status: Former    Types: Cigarettes    Quit date: 01/12/1980    Years since quitting: 42.1   Smokeless tobacco: Never  Vaping Use   Vaping Use: Never used  Substance and Sexual Activity   Alcohol use: No    Alcohol/week: 0.0 standard drinks of alcohol   Drug use: No   Sexual activity: Yes  Other Topics Concern   Not on file  Social History Narrative   Right handed   Social Determinants of Health   Financial Resource Strain: Low Risk  (01/17/2022)   Overall Financial Resource Strain (CARDIA)    Difficulty of Paying Living Expenses: Not hard at all  Food Insecurity: No Food Insecurity (01/17/2022)   Hunger Vital Sign    Worried About Running Out of Food in the Last Year: Never true    Ran Out of Food in the Last Year: Never true  Transportation Needs: No Transportation Needs (01/17/2022)   PRAPARE - Hydrologist (Medical): No     Lack of Transportation (Non-Medical): No  Physical Activity: Insufficiently Active (01/17/2022)   Exercise Vital Sign    Days of Exercise per Week: 2 days    Minutes of Exercise per Session: 40 min  Stress:  No Stress Concern Present (01/17/2022)   [place]    Feeling of Stress : Not at all  Social Connections: Not on file    ECOG Status: 0 - Asymptomatic  Review of Systems  Review of Systems: A 12 point ROS discussed and pertinent positives are indicated in the HPI above.  All other systems are negative.  Physical Exam No direct physical exam was performed (except for noted visual exam findings with Video Visits).   Vital Signs: There were no vitals taken for this visit.  Imaging:  The following examinations were personally reviewed in detail: CT abdomen pelvis - 02/08/2022; 02/22/2021; 08/13/2019  Review of CT scan performed 02/08/2022 demonstrates a subtle enhancing lesion involving the superior lateral aspects of the right kidney measuring approximately 1.4 x 1.3 x 1.1 cm (axial image 79, series 900; axial image 75, series 6), increased in size compared to the 02/2021 examination, previously, 1.4 x 1.3 x 1.0 cm and approximately 1.1 x 0.9 x 0.8 cm when compared to the 08/2019 examination, with a growth rate estimated at approximately 1-2 mm/year.  No additional discrete renal lesions are identified.  The right renal vein appears widely patent.  No regional lymphadenopathy.  DG Chest 2 View  Result Date: 02/03/2022 CLINICAL DATA:  Right renal cell carcinoma. EXAM: CHEST - 2 VIEW COMPARISON:  October 11, 2021 FINDINGS: The heart size and mediastinal contours are stable. Both lungs are clear. The visualized skeletal structures are stable. IMPRESSION: No active cardiopulmonary disease. Electronically Signed   By: Abelardo Diesel M.D.   On: 02/03/2022 09:52    Labs:  CBC: Recent Labs    09/19/21 1604  WBC 7.0  HGB 13.0   HCT 39.9  PLT 195    COAGS: No results for input(s): "INR", "APTT" in the last 8760 hours.  BMP: Recent Labs    09/19/21 1604 10/27/21 1153  NA 141 144  K 5.2 4.8  CL 100 104  CO2 28 21  GLUCOSE 130* 106*  BUN 26 22  CALCIUM 9.3 9.3  CREATININE 1.11* 0.93    LIVER FUNCTION TESTS: No results for input(s): "BILITOT", "AST", "ALT", "ALKPHOS", "PROT", "ALBUMIN" in the last 8760 hours.  TUMOR MARKERS: No results for input(s): "AFPTM", "CEA", "CA199", "CHROMGRNA" in the last 8760 hours.  Assessment and Plan:  [name] Long is a 82 y.o. female with past medical history significant for asthma, DVT, connective tissue disorder, coronary artery disease (post coronary bypass grafting and coronary stent placement 1 year ago, previously on Plavix, stopping this medication earlier this month), fibromyalgia, hypertension, hyperlipidemia and urinary incontinence,, post right hip and total knee replacements and post robotic assisted right lower pole partial nephrectomy on 02/27/2018, who is seen today in telemedicine consultation for evaluation of slowly growing right upper pole renal lesion worrisome for renal cell carcinoma.  The patient is in her baseline state of fairly well health, currently without complaint.  She remains asymptomatic in regards to this incidentally discovered right-sided renal lesion.  Specifically, no flank pain or hematuria.  No change in appetite or energy level.  No unintentional weight loss.  The following examinations were personally reviewed in detail:   CT abdomen pelvis - 02/08/2022; 02/22/2021; 08/13/2019  Review of CT scan performed 02/08/2022 demonstrates a subtle enhancing lesion involving the superior lateral aspects of the right kidney measuring approximately 1.4 x 1.3 x 1.1 cm (axial image 79, series 900; axial image 75, series 6), increased in size compared  to the 02/2021 examination, previously, 1.4 x 1.3 x 1.0 cm and approximately 1.1 x 0.9 x 0.8 cm when  compared to the 08/2019 examination, with a growth rate estimated at approximately 1-2 mm/year.  No additional discrete renal lesions are identified.  The right renal vein appears widely patent.  No regional lymphadenopathy.  Above was discussed at length with the patient.  I explained that potential treatment options could entail repeat partial nephrectomy (considered the gold standard due to acquisition of surgical margins at the time of resection), image guided cryoablation versus continued conservative management.  Risks and benefits of image guided renal cryoablation was discussed with the patient including, but not limited to, failure to treat entire lesion, bleeding, infection, damage to adjacent structures, hematuria, urine leak, decrease in renal function or post procedural neuropathy.  The patient states that she would like to avoid a repeat partial nephrectomy and at this point is impartial with pursuing treatment versus continued conservative management.  Given the small size of the lesion (presently measuring 1.4 cm), as well as established slow grade with a rate of 1 to 2 mm/year, in light of patient's coronary stent placement approximately 1 year ago, just stopping Plavix earlier this month, I feel it is prudent to continue conservative management and will coordinate for the patient to undergo a surveillance renal protocol CT scan of the abdomen pelvis in 6 months (mid August 2024).    Note, I will pursue surveillance imaging with CT scan as there is rather significant susceptibility artifact adjacent to the right kidney on abdominal MRI performed 02/07/2020 as a sequela of prior partial nephrectomy.  The patient is agreement with the proposed plan of care and knows to call the interventional radiology clinic with any interval questions or concerns.  PLAN: - Repeat telemedicine consultation following acquisition of renal protocol surveillance CT scan of the abdomen pelvis performed in  6 months (mid August 2024)  Thank you for this interesting consult.  I greatly enjoyed meeting NIXIE JOKINEN and look forward to participating in their care.  A copy of this report was sent to the requesting provider on this date.  Electronically Signed: Sandi Mariscal 02/28/2022, 9:24 AM   I spent a total of 30 Minutes in remote  clinical consultation, greater than 50% of which was counseling/coordinating care for right sided renal lesion.    Visit type: Audio only (telephone). Audio (no video) only due to patient's lack of internet/smartphone capability. Alternative for in-person consultation at [facility], [address] [place], [place], [place]. This visit type was conducted due to national recommendations for restrictions regarding the COVID-19 Pandemic (e.g. social distancing).  This format is felt to be most appropriate for this patient at this time.  All issues noted in this document were discussed and addressed.

## 2022-04-04 ENCOUNTER — Other Ambulatory Visit: Payer: Self-pay | Admitting: Family Medicine

## 2022-04-04 DIAGNOSIS — M150 Primary generalized (osteo)arthritis: Secondary | ICD-10-CM | POA: Diagnosis not present

## 2022-04-04 DIAGNOSIS — G894 Chronic pain syndrome: Secondary | ICD-10-CM | POA: Diagnosis not present

## 2022-04-04 DIAGNOSIS — Z79891 Long term (current) use of opiate analgesic: Secondary | ICD-10-CM | POA: Diagnosis not present

## 2022-04-04 DIAGNOSIS — H6691 Otitis media, unspecified, right ear: Secondary | ICD-10-CM

## 2022-04-04 DIAGNOSIS — M47812 Spondylosis without myelopathy or radiculopathy, cervical region: Secondary | ICD-10-CM | POA: Diagnosis not present

## 2022-04-13 ENCOUNTER — Encounter: Payer: Self-pay | Admitting: Family Medicine

## 2022-04-13 ENCOUNTER — Ambulatory Visit (INDEPENDENT_AMBULATORY_CARE_PROVIDER_SITE_OTHER): Payer: PPO | Admitting: Family Medicine

## 2022-04-13 VITALS — BP 138/78 | HR 83 | Temp 97.9°F | Resp 16 | Wt 184.0 lb

## 2022-04-13 DIAGNOSIS — J385 Laryngeal spasm: Secondary | ICD-10-CM | POA: Diagnosis not present

## 2022-04-13 DIAGNOSIS — H9203 Otalgia, bilateral: Secondary | ICD-10-CM

## 2022-04-13 NOTE — Progress Notes (Signed)
   Subjective:    Patient ID: [name] Long, female    DOB: 1940-07-02, 82 y.o.   MRN: [id number]  HPI She is here for consult concerning intermittent right and left earache that tends to come and go but no associated fever, chills, sore throat.  She also has a history of laryngospasm.  She was seen February 6 by Dr. Kendrick Fries.  He recommended that she be referred to Dr. Harriette Ohara in [place].  Apparently nothing is happening with that.  Since then she has been scheduled to have cervical fusion however the anesthesiologist apparently will not do anything until seen by ENT to address the recurrent laryngospasm.   Review of Systems     Objective:   Physical Exam Alert and in no distress.  Both tympanic membranes and canals are normal.  Neck is supple without adenopathy.  Throat is clear. No tenderness over TMJs.      Assessment & Plan:  Laryngospasm - Plan: Ambulatory referral to ENT  Otalgia of both ears Referral will be made to Dr. Delford Field to get his input into how to handle laryngospasm especially with pending cervical surgery. I explained that I found nothing to cause earache and recommend conservative care with Tylenol.

## 2022-05-01 ENCOUNTER — Telehealth (HOSPITAL_BASED_OUTPATIENT_CLINIC_OR_DEPARTMENT_OTHER): Payer: Self-pay | Admitting: Pulmonary Disease

## 2022-05-11 DIAGNOSIS — H1011 Acute atopic conjunctivitis, right eye: Secondary | ICD-10-CM | POA: Diagnosis not present

## 2022-05-11 DIAGNOSIS — H2513 Age-related nuclear cataract, bilateral: Secondary | ICD-10-CM | POA: Diagnosis not present

## 2022-05-11 DIAGNOSIS — H532 Diplopia: Secondary | ICD-10-CM | POA: Diagnosis not present

## 2022-05-11 DIAGNOSIS — H353131 Nonexudative age-related macular degeneration, bilateral, early dry stage: Secondary | ICD-10-CM | POA: Diagnosis not present

## 2022-05-12 DIAGNOSIS — J385 Laryngeal spasm: Secondary | ICD-10-CM | POA: Diagnosis not present

## 2022-05-12 DIAGNOSIS — K219 Gastro-esophageal reflux disease without esophagitis: Secondary | ICD-10-CM | POA: Diagnosis not present

## 2022-05-22 ENCOUNTER — Ambulatory Visit
Admission: RE | Admit: 2022-05-22 | Discharge: 2022-05-22 | Disposition: A | Discharge status: Home / Self Care | Payer: PPO | Source: Ambulatory Visit | Attending: Family Medicine | Admitting: Family Medicine

## 2022-05-22 ENCOUNTER — Ambulatory Visit (INDEPENDENT_AMBULATORY_CARE_PROVIDER_SITE_OTHER): Payer: PPO | Admitting: Family Medicine

## 2022-05-22 ENCOUNTER — Encounter: Payer: Self-pay | Admitting: Family Medicine

## 2022-05-22 VITALS — BP 128/64 | HR 72 | Temp 98.2°F | Ht 64.0 in | Wt 185.8 lb

## 2022-05-22 DIAGNOSIS — R059 Cough, unspecified: Secondary | ICD-10-CM

## 2022-05-22 DIAGNOSIS — J189 Pneumonia, unspecified organism: Secondary | ICD-10-CM

## 2022-05-22 DIAGNOSIS — J101 Influenza due to other identified influenza virus with other respiratory manifestations: Secondary | ICD-10-CM | POA: Diagnosis not present

## 2022-05-22 DIAGNOSIS — R0989 Other specified symptoms and signs involving the circulatory and respiratory systems: Secondary | ICD-10-CM | POA: Diagnosis not present

## 2022-05-22 DIAGNOSIS — R0609 Other forms of dyspnea: Secondary | ICD-10-CM | POA: Diagnosis not present

## 2022-05-22 DIAGNOSIS — J111 Influenza due to unidentified influenza virus with other respiratory manifestations: Secondary | ICD-10-CM | POA: Diagnosis not present

## 2022-05-22 DIAGNOSIS — J4530 Mild persistent asthma, uncomplicated: Secondary | ICD-10-CM | POA: Diagnosis not present

## 2022-05-22 LAB — POCT INFLUENZA A/B
Influenza A, POC: POSITIVE — AB
Influenza B, POC: NEGATIVE

## 2022-05-22 LAB — POC COVID19 BINAXNOW: SARS Coronavirus 2 Ag: NEGATIVE

## 2022-05-22 MED ORDER — AZITHROMYCIN 250 MG PO TABS: ORAL_TABLET | ORAL | 0 refills | Status: DC

## 2022-05-22 MED ORDER — PREDNISONE 10 MG (21) PO TBPK: ORAL_TABLET | ORAL | 0 refills | Status: DC

## 2022-05-22 MED ORDER — CEFTRIAXONE SODIUM 1 G IJ SOLR
1.0000 g | Freq: Once | INTRAMUSCULAR | Status: AC
  Administered 2022-05-22: 1 g via INTRAMUSCULAR

## 2022-05-22 NOTE — Patient Instructions (Signed)
  Drink plenty of water. Consider using nasal saline spray and sinus rinses to help clean out the nose (before the mucus gets to your throat and contributes to the coughing. Use plain Mucinex 12 hour twice daily to loosen up the mucus. You may continue Delsym syrup twice daily for the cough. I'm also prescribing benzonate to help with cough. Use your albuterol as needed.  Go to [address] to [facility] today for a chest x-ray. If your x-ray shows pneumonia, we will send in a prescription for an antibiotic. If it doesn't show pneumonia, we will treat with steroids and the cough medications, as this is then likely viral. Your influenza A test was positive today. It is beyond the treatment period for Tamiflu.  Follow up next week if you aren't better.

## 2022-05-22 NOTE — Progress Notes (Signed)
Chief Complaint  Patient presents with   Cough    Cough and congestion that started 4 days ago. Has ot done any covid tests.    She had a coughing fit the last week of April, got over it. She has been at a lot of graduations/crowds. She felt fine while at [place] 2 weekends ago. She started feeling badly on Wed/Thur (5/15, 5/16)--a lot of coughing, headache. This past wekeend she was at [place] and [place]--wouldn't miss out on the graduation, but was coughing a lot, didn't feel well.  Denies any fever. No runny nose, sneezing, sore throat.  Cough is nonproductive--feels wet, but can't get anything up. She started mucinex yesterday (liquid)--made her nauseated, so has only taken 2 doses. Got Delsym last night, has taken 2 doses, not helping much.  Pt has asthma and mild COPD.  Uses Breztri twice daily.   She has been getting out of breath related to her coughing recently, and also with exertion. She has albuterol at home, used it yesterday,helped some. Hasn't used it yet today.  She has h/o laryngospasm.  Has chronic hoarseness, from GERD    PMH, PSH SH reviewed  CAD, HTN, RA, h/o RCC, fibromyalgia, GERD, asthma, laryngospasm  Outpatient Encounter Medications as of 05/22/2022  Medication Sig Note   amLODipine (NORVASC) 5 MG tablet Take 1 tablet (5 mg total) by mouth daily.    aspirin EC 81 MG tablet Take 81 mg by mouth daily. Swallow whole.    atorvastatin (LIPITOR) 20 MG tablet Take 1 tablet (20 mg total) by mouth daily.    Budeson-Glycopyrrol-Formoterol (BREZTRI AEROSPHERE) 160-9-4.8 MCG/ACT AERO Inhale 2 puffs into the lungs in the morning and at bedtime.    Calcium Carb-Cholecalciferol (CALTRATE 600+D3 PO) Take 1 tablet by mouth daily.    Dextromethorphan HBr (DELSYM PO) Take 10 mLs by mouth in the morning and at bedtime. 05/22/2022: Last dose 6am   esomeprazole (NEXIUM) 20 MG packet Take 20 mg by mouth daily before breakfast.    Evolocumab (REPATHA SURECLICK) 140 MG/ML  SOAJ Inject 140 mg into the skin every 14 (fourteen) days.    gabapentin (NEURONTIN) 600 MG tablet Take 1,200-1,800 mg by mouth See admin instructions. Take 2 tablets (1200 mg) by mouth in the morning & take 3 tablets (1800 mg) by mouth at night    magnesium oxide (MAG-OX) 400 MG tablet Take 400 mg by mouth at bedtime.    metoprolol tartrate (LOPRESSOR) 25 MG tablet Take 1 tablet (25 mg total) by mouth 2 (two) times daily.    montelukast (SINGULAIR) 10 MG tablet Take 1 tablet (10 mg total) by mouth daily.    Multiple Vitamins-Minerals (PRESERVISION AREDS 2 PO) Take 1 tablet by mouth in the morning and at bedtime.    silodosin (RAPAFLO) 8 MG CAPS capsule Take 1 capsule (8 mg total) by mouth daily.    VITAMIN D PO Take by mouth. 05/22/2022: 1000iu daily   [DISCONTINUED] clopidogrel (PLAVIX) 75 MG tablet Take 1 tablet (75 mg total) by mouth daily with breakfast.    albuterol (PROVENTIL HFA) 108 (90 Base) MCG/ACT inhaler INHALE 2 PUFF AS DIRECTED FOUR TIMES A DAY AS NEEDED (Patient not taking: Reported on 05/22/2022) 05/22/2022: As needed   carisoprodol (SOMA) 350 MG tablet Take 350 mg by mouth every 8 (eight) hours as needed (for arthritis pain).  (Patient not taking: Reported on 05/22/2022) 05/22/2022: As needed   HYDROmorphone (DILAUDID) 4 MG tablet Take 1 tablet (4 mg total) by mouth every 4 (  four) hours as needed for severe pain. (Patient not taking: Reported on 05/22/2022) 05/22/2022: As needed   nitroGLYCERIN (NITROSTAT) 0.4 MG SL tablet Place 1 tablet (0.4 mg total) under the tongue every 5 (five) minutes x 3 doses as needed for chest pain. (Patient not taking: Reported on 05/22/2022) 10/11/2021: Prn last dose last week   No facility-administered encounter medications on file as of 05/22/2022.   Allergies  Allergen Reactions   Dimenhydrinate Other (See Comments)    "locks up kidneys"   Morphine And Codeine Nausea And Vomiting   Percocet [Oxycodone-Acetaminophen] Other (See Comments)     hallucinations   Zolpidem Other (See Comments)    Unusual behavior   ROS: No fever.  +chills. DOE.  Nausea from mucinex. She is having diarrhea recently (with some leakage of stool with heavy coughing; denies urinary incontinence). No ear pain currently. Denies urinary complaints. No bleeding, rash. See HPI   PHYSICAL EXAM:  BP 128/64   Pulse 72   Temp 98.2 F (36.8 C) (Tympanic)   Ht 5\' 4"  (1.626 m)   Wt 185 lb 12.8 oz (84.3 kg)   BMI 31.89 kg/m   Pleasant female, mildly ill appearing. Frequent coughing during the visit--sometimes sounded dry and slightly barky. Other times sounded wet. She had some sniffling during the visit. She was speaking easily and comfortably. HEENT: conjunctiva and sclera are clear, EOMI. TM's and EAC normal. OP is clear Nasal mucosa is mildly edematous, some white drainage and yellow crusting noted bilaterally.  Sinuses are nontender. Neck: no lymphadenopathy or mass Heart: regular rate and rhythm Lungs:  some wheezing initially, cleared with cough. Some crackles persisted at the right base Abdomen: soft, nontender Extremities: no notable edema Neuro: alert and oriented, cranial nerves grossly intact, normal gait  + Influenza A (repeated twice) COVID negative  CXR:  IMPRESSION: New asymmetric perihilar opacity within the right upper lobe compatible with pneumonia. Followup PA and lateral chest X-ray is recommended in 3-4 weeks following trial of antibiotic therapy to ensure resolution and exclude underlying malignancy.   ASSESSMENT/PLAN:  Pneumonia of right lung due to infectious organism, unspecified part of lung - abnl CXR; ?complication of influenza A. Pt returned for rocephin injection. Rx zpak. f/u next week - Plan: azithromycin (ZITHROMAX) 250 MG tablet, cefTRIAXone (ROCEPHIN) injection 1 g  Influenza A - very late in season, repeated twice. Past timeframe for treatment with tamiflu. Supportive measures  Mild persistent chronic  asthma without complication - exacerbation with current illness.  Will treat with course of steroids. Cont Breztri and albuterol prn - Plan: predniSONE (STERAPRED UNI-PAK 21 TAB) 10 MG (21) TBPK tablet  Dyspnea on exertion - Plan: DG Chest 2 View  Cough, unspecified type - Plan: POC COVID-19, Influenza A/B, DG Chest 2 View  Chest congestion - Plan: POC COVID-19, Influenza A/B  Supportive measures reviewed with pt, including mucinex and dextromethorphan. CXR ordered given crackles at R base.   Chart updated above--+CXR for pna.  Returned for rocephin, rx'd zpak and prednisone. F/u next week.

## 2022-05-29 IMAGING — DX DG CHEST 1V PORT
1 series · 1 of 1 positions shown · non-contrast
Comparison: None.

CLINICAL DATA: Chest pain

EXAM:
PORTABLE CHEST 1 VIEW

[chest ap]
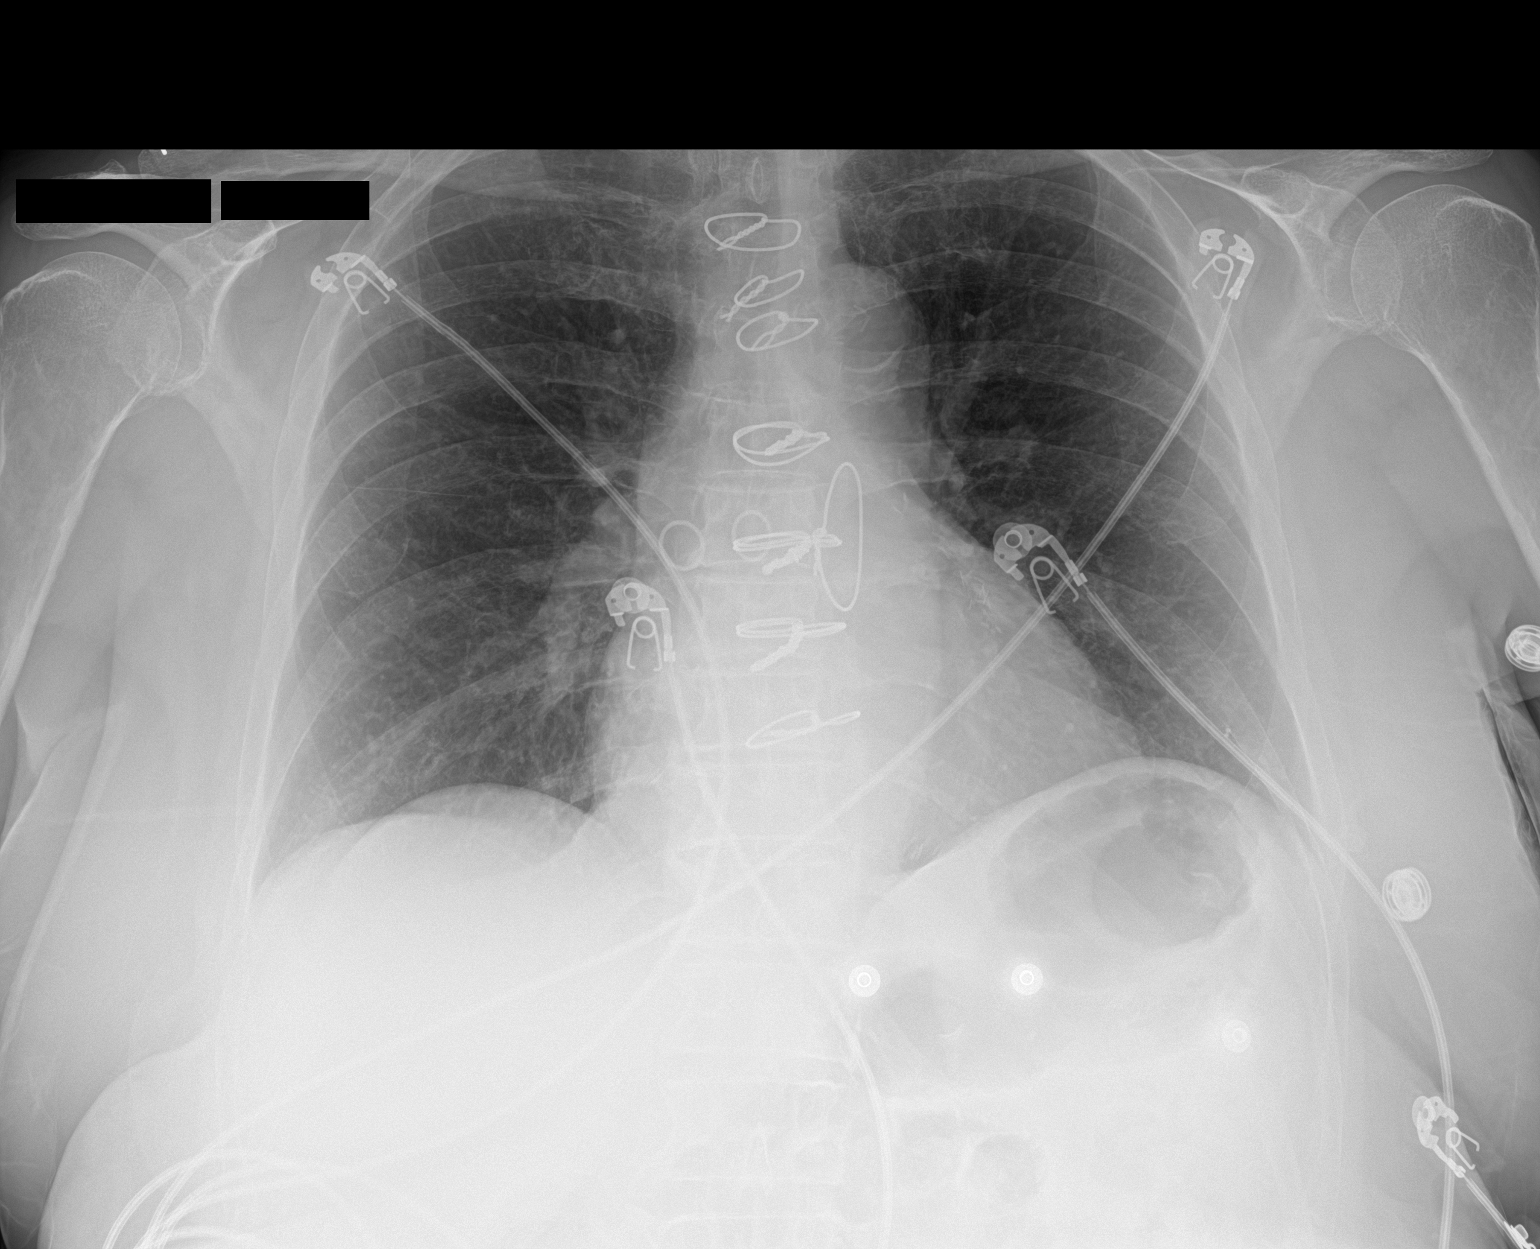

[1 of 1 positions shown; findings below may reference images not displayed]

FINDINGS: Sternotomy wires overlie normal cardiac silhouette. Normal pulmonary
vasculature. No effusion, infiltrate, or pneumothorax. No acute
osseous abnormality.
IMPRESSION: No acute cardiopulmonary process.

## 2022-05-30 NOTE — Progress Notes (Signed)
Chief Complaint  Patient presents with   Follow-up    1 week follow. Feels shaky and has no enery. Has not been up since she has been in here. Dr. Mikal Plane is planning cervical fusion, how long does she need to wait until she can have this done? She is to call and r/s.   Patient presents for follow-up on pneumonia. She had also tested positive for Influenza A. She was seen 5/20 At that time she had been coughing a lot, mainly nonproductive.  She didn't have fever, or other URI symptoms.  Pt has asthma and mild COPD and was using Breztri twice daily.  She had needed some albuterol, as she had noted some DOE. She has h/o laryngospasm.  Has chronic hoarseness, from GERD  CXR showed: IMPRESSION: New asymmetric perihilar opacity within the right upper lobe compatible with pneumonia. Followup PA and lateral chest X-ray is recommended in 3-4 weeks following trial of antibiotic therapy to ensure resolution and exclude underlying malignancy.  She was treated with an injection of Rocephin, and z-pak. She was treated with a course of prednisone. Also recommended guaifenesin and dextromethorphan.  Today she reports that was the sickest she has ever been in her life.  She didn't think she would live (almost went to ER twice).  Today is the first day that she got dressed. She gets tired walking around, feels weak from sitting around and doing nothing.  She reports having no energy.  She had thick, discolored phlegm for a few days (which triggered laryngospasm, which is why she almost went to ER).  No longer coughing up any phlegm.  She has persistent dry cough. Hasn't used albuterol for a couple of days.   PMH, PSH, SH reviewed  Outpatient Encounter Medications as of 05/31/2022  Medication Sig Note   acetaminophen (TYLENOL) 500 MG tablet Take 500 mg by mouth every 6 (six) hours as needed. 05/31/2022: Took 500mg  yesterday   amLODipine (NORVASC) 5 MG tablet Take 1 tablet (5 mg total) by mouth daily.     aspirin EC 81 MG tablet Take 81 mg by mouth daily. Swallow whole.    atorvastatin (LIPITOR) 20 MG tablet Take 1 tablet (20 mg total) by mouth daily.    Budeson-Glycopyrrol-Formoterol (BREZTRI AEROSPHERE) 160-9-4.8 MCG/ACT AERO Inhale 2 puffs into the lungs in the morning and at bedtime.    Calcium Carb-Cholecalciferol (CALTRATE 600+D3 PO) Take 1 tablet by mouth daily.    esomeprazole (NEXIUM) 20 MG packet Take 20 mg by mouth daily before breakfast.    Evolocumab (REPATHA SURECLICK) 140 MG/ML SOAJ Inject 140 mg into the skin every 14 (fourteen) days. 05/31/2022: Taking tomorrow   gabapentin (NEURONTIN) 600 MG tablet Take 1,200-1,800 mg by mouth See admin instructions. Take 2 tablets (1200 mg) by mouth in the morning & take 3 tablets (1800 mg) by mouth at night    magnesium oxide (MAG-OX) 400 MG tablet Take 400 mg by mouth at bedtime.    metoprolol tartrate (LOPRESSOR) 25 MG tablet Take 1 tablet (25 mg total) by mouth 2 (two) times daily.    montelukast (SINGULAIR) 10 MG tablet Take 1 tablet (10 mg total) by mouth daily.    Multiple Vitamins-Minerals (PRESERVISION AREDS 2 PO) Take 1 tablet by mouth in the morning and at bedtime.    silodosin (RAPAFLO) 8 MG CAPS capsule Take 1 capsule (8 mg total) by mouth daily.    VITAMIN D PO Take by mouth. 05/22/2022: 1000iu daily   albuterol (PROVENTIL HFA) 108 (90  Base) MCG/ACT inhaler INHALE 2 PUFF AS DIRECTED FOUR TIMES A DAY AS NEEDED (Patient not taking: Reported on 05/22/2022) 05/31/2022: As needed   carisoprodol (SOMA) 350 MG tablet Take 350 mg by mouth every 8 (eight) hours as needed (for arthritis pain).  (Patient not taking: Reported on 05/22/2022) 05/22/2022: As needed   Dextromethorphan HBr (DELSYM PO) Take 10 mLs by mouth in the morning and at bedtime. (Patient not taking: Reported on 05/31/2022)    HYDROmorphone (DILAUDID) 4 MG tablet Take 1 tablet (4 mg total) by mouth every 4 (four) hours as needed for severe pain. (Patient not taking: Reported on  05/22/2022) 05/31/2022: As needed   nitroGLYCERIN (NITROSTAT) 0.4 MG SL tablet Place 1 tablet (0.4 mg total) under the tongue every 5 (five) minutes x 3 doses as needed for chest pain. (Patient not taking: Reported on 05/22/2022)    [DISCONTINUED] azithromycin (ZITHROMAX) 250 MG tablet Take 2 tablets by mouth on first day, then 1 tablet by mouth on days 2 through 5    [DISCONTINUED] predniSONE (STERAPRED UNI-PAK 21 TAB) 10 MG (21) TBPK tablet Take as directed    No facility-administered encounter medications on file as of 05/31/2022.   Allergies  Allergen Reactions   Dimenhydrinate Other (See Comments)    "locks up kidneys"   Morphine And Codeine Nausea And Vomiting   Percocet [Oxycodone-Acetaminophen] Other (See Comments)    hallucinations   Zolpidem Other (See Comments)    Unusual behavior    ROS: No fever, chills, nausea, vomiting, abdominal pain. Feels a little dizzy and weak. Some intermittent diarrhea, much better than last visit. Denies urinary complaints, no vaginal itching or discharge. No bleeding, rash. See HPI   PHYSICAL EXAM:  BP 120/70   Pulse 68   Temp 97.8 F (36.6 C) (Tympanic)   Ht 5\' 4"  (1.626 m)   Wt 185 lb (83.9 kg)   BMI 31.76 kg/m   Wt Readings from Last 3 Encounters:  05/31/22 185 lb (83.9 kg)  05/22/22 185 lb 12.8 oz (84.3 kg)  04/13/22 184 lb (83.5 kg)   Pleasant female, in no distress, sitting comfortably in chair Hoarse voice.  Frequent dry cough throughout visit. She was speaking easily and comfortably between coughing spells.Marland Kitchen HEENT: conjunctiva and sclera are clear, EOMI.  Neck: no lymphadenopathy or mass Heart: regular rate and rhythm Lungs:  clear bilaterally, no wheezes, rales, ronchi. Extremities: no notable edema Neuro: alert and oriented, cranial nerves grossly intact    ASSESSMENT/PLAN:  Pneumonia of right lung due to infectious organism, unspecified part of lung - improving; still weak. Clinically lungs improved.  f/u CXR in  3-4 weeks - Plan: DG Chest 2 View  Cough, unspecified type - restart Mucinex DM BID.  Recommended albuterol (has neb and MDI) to see if it helps with her dry cough   Stay well hydrated. Try using the albuterol to see if this helps with the dry cough. I would resume Mucinex DM twice daily.

## 2022-05-31 ENCOUNTER — Encounter: Payer: Self-pay | Admitting: Family Medicine

## 2022-05-31 ENCOUNTER — Ambulatory Visit (INDEPENDENT_AMBULATORY_CARE_PROVIDER_SITE_OTHER): Payer: PPO | Admitting: Family Medicine

## 2022-05-31 VITALS — BP 120/70 | HR 68 | Temp 97.8°F | Ht 64.0 in | Wt 185.0 lb

## 2022-05-31 DIAGNOSIS — J189 Pneumonia, unspecified organism: Secondary | ICD-10-CM | POA: Diagnosis not present

## 2022-05-31 DIAGNOSIS — R059 Cough, unspecified: Secondary | ICD-10-CM | POA: Diagnosis not present

## 2022-05-31 NOTE — Patient Instructions (Signed)
  Stay well hydrated. Try using the albuterol to see if this helps with the dry cough. I would resume Mucinex DM twice daily.  Go to [facility] in 3-4 weeks for a repeat chest x-ray. Follow up with [place] sooner if you are getting worse or having other issues.  Slowly work on building back your activity. Pneumonia can take a good month for you to regain your strength.

## 2022-06-02 ENCOUNTER — Other Ambulatory Visit: Payer: Self-pay

## 2022-06-02 DIAGNOSIS — I10 Essential (primary) hypertension: Secondary | ICD-10-CM

## 2022-06-02 DIAGNOSIS — I25709 Atherosclerosis of coronary artery bypass graft(s), unspecified, with unspecified angina pectoris: Secondary | ICD-10-CM

## 2022-06-02 DIAGNOSIS — J4530 Mild persistent asthma, uncomplicated: Secondary | ICD-10-CM

## 2022-06-02 DIAGNOSIS — K219 Gastro-esophageal reflux disease without esophagitis: Secondary | ICD-10-CM

## 2022-06-06 ENCOUNTER — Ambulatory Visit (INDEPENDENT_AMBULATORY_CARE_PROVIDER_SITE_OTHER): Payer: PPO | Admitting: Pulmonary Disease

## 2022-06-06 ENCOUNTER — Encounter (HOSPITAL_BASED_OUTPATIENT_CLINIC_OR_DEPARTMENT_OTHER): Payer: Self-pay | Admitting: Pulmonary Disease

## 2022-06-06 VITALS — BP 126/74 | HR 89 | Temp 97.9°F | Ht 64.0 in | Wt 183.0 lb

## 2022-06-06 DIAGNOSIS — J4530 Mild persistent asthma, uncomplicated: Secondary | ICD-10-CM

## 2022-06-06 DIAGNOSIS — J385 Laryngeal spasm: Secondary | ICD-10-CM

## 2022-06-06 NOTE — Assessment & Plan Note (Signed)
Treating her as COPD/asthma overlap with Breztri She will use albuterol for rescue

## 2022-06-06 NOTE — Progress Notes (Signed)
   Subjective:    Patient ID: [name] Long, female    DOB: Aug 03, 1940, 82 y.o.   MRN: [id number]  HPI 82 year old ex-smoker with COPD/asthma presents to establish care. She was seen by my partner Dr. Kendrick Fries in the past  Referred in December 2023 for dyspnea, has laryngospasm and a prior history of asthma.   She developed laryngospasm after PFTs in February.  Last office visit 02/2022 it was felt that dry powder of Advair was contributing to bronchospasm and she was switched to [facility] and referred to Dr. Harriette Ohara  Chief Complaint  Patient presents with   Follow-up    Pt states she was diagnosed with Type A flu about 3 weeks ago and states she also had pna. States she is feeling better after that.     Interim -she was seen by PCP and tested positive for influenza, chest x-ray showed right upper lobe opacity.  She was treated with Rocephin and then Z-Pak and a course of prednisone.  She had thick phlegm for a few days and this triggered Langa spasm, she almost went to the ED She was seen by ENT Dr. Pollyann Kennedy and recommended to avoid caffeine to decrease GERD. She reports episodes of laryngospasm about 7 times in the last 4 months, last such episode was 3 weeks ago.  Episode lasts less than a minute. Her neck fusion surgery was delayed due to this. She does not feel like changing from Advair to [facility] has made much of a difference   PMH  CABG 2013.   Quit smoking 1982, smoked 2-3 packs per day.  Significant tests/ events reviewed  12/2018 FENO 20 ppb 2/ 2024 full PFT ratio 64%, FEV1 1.34 L 66% predicted, improved to 1.52 L 13% change with bronchodilator, review of data appears adequate, total lung capacity 4.71 L 90% predicted, DLCO 13.10 67% predicted -episode of laryngospasm after PFTs   Review of Systems neg for any significant sore throat, dysphagia, itching, sneezing, nasal congestion or excess/ purulent secretions, fever, chills, sweats, unintended wt loss, pleuritic or  exertional cp, hempoptysis, orthopnea pnd or change in chronic leg swelling. Also denies presyncope, palpitations, heartburn, abdominal pain, nausea, vomiting, diarrhea or change in bowel or urinary habits, dysuria,hematuria, rash, arthralgias, visual complaints, headache, numbness weakness or ataxia.      Objective:   Physical Exam  Gen. Pleasant, obese, in no distress ENT - no lesions, no post nasal drip Neck: No JVD, no thyromegaly, no carotid bruits Lungs: no use of accessory muscles, no dullness to percussion, decreased without rales or rhonchi  Cardiovascular: Rhythm regular, heart sounds  normal, no murmurs or gallops, no peripheral edema Musculoskeletal: No deformities, no cyanosis or clubbing , no tremors       Assessment & Plan:    Preop evaluation -she is cleared for neck fusion surgery from a pulmonary standpoint.  She does need follow-up chest x-ray in 2 to 3 weeks for clearing of right upper lobe infiltrate, she was recently treated for influenza/pneumonia

## 2022-06-06 NOTE — Patient Instructions (Signed)
Chest x-ray in 2 to 3 weeks. You are cleared for surgery  Continue on [facility]

## 2022-06-06 NOTE — Assessment & Plan Note (Signed)
Unclear trigger for episodes of laryngospasm.  Changing to [facility] from powder inhaler has not really helped. She has been seen by ENT and is on Nexium for GERD, she has been asked to avoid caffeine.  We discussed nonpharmacological therapy for GERD.  We will observe and see if frequency of episodes decrease

## 2022-06-08 ENCOUNTER — Other Ambulatory Visit: Payer: Self-pay | Admitting: Neurosurgery

## 2022-06-08 DIAGNOSIS — M150 Primary generalized (osteo)arthritis: Secondary | ICD-10-CM | POA: Diagnosis not present

## 2022-06-08 DIAGNOSIS — Z79891 Long term (current) use of opiate analgesic: Secondary | ICD-10-CM | POA: Diagnosis not present

## 2022-06-08 DIAGNOSIS — M47812 Spondylosis without myelopathy or radiculopathy, cervical region: Secondary | ICD-10-CM | POA: Diagnosis not present

## 2022-06-08 DIAGNOSIS — G894 Chronic pain syndrome: Secondary | ICD-10-CM | POA: Diagnosis not present

## 2022-06-22 ENCOUNTER — Ambulatory Visit
Admission: RE | Admit: 2022-06-22 | Discharge: 2022-06-22 | Disposition: A | Discharge status: Home / Self Care | Payer: PPO | Source: Ambulatory Visit | Attending: Family Medicine | Admitting: Family Medicine

## 2022-06-22 DIAGNOSIS — J189 Pneumonia, unspecified organism: Secondary | ICD-10-CM

## 2022-06-30 NOTE — Pre-Procedure Instructions (Signed)
    [name] Long  06/30/2022    Your procedure is scheduled on Wednesday, July 12.  Report to [facility] Admitting at 6:30 A.M.  Call this number if you have problems the morning of surgery:  [phone]  This is the pre- op desk. If you have question from now to the day of surgery, call [phone] and ask for any nurse. Call between 8:00 AM and 4:00PM  If you experience any cold or flu symptoms such as cough, fever, chills, shortness of breath, etc. between now and your scheduled surgery, please notify us at the above number.   Remember:  Do not eat or  after midnight.  You may drink clear liquids until 5:30 AM.  Clear liquids allowed are:      Water, Juice (No red color; non-citric and without pulp; diabetics please choose diet or no sugar options), Carbonated beverages (diabetics please choose diet or no sugar options), Clear Tea (No creamer, milk, or cream, including half & half and powdered creamer), Black Coffee Only (No creamer, milk or cream, including half & half and powdered creamer), Plain Jell-O Only (No red color; diabetics please choose no sugar options), Clear Sports drink (No red color; diabetics please choose diet or no sugar options), and Plain Popsicles Only (No red color; diabetics please choose no sugar options)   Take these medicines the morning of surgery with A SIP OF WATER : amLODipine (NORVASC)   Use: BREZTRI AEROSPHERE  fluticasone (FLONASE)  gabapentin (NEURONTIN)  metoprolol tartrate (LOPRESSOR)  atorvastatin (LIPITOR)  esomeprazole (NEXIUM)   If needed: acetaminophen (TYLENOL)  albuterol (PROVENTIL HFA) - bring it with you to the hospital cetirizine (ZYRTEC) nitroGLYCERIN (NITROSTAT) call EMS   See 5 Shower Instruction Sheet   Contacts, dentures or bridgework may not be worn into surgery.    For patients admitted to the hospital, discharge time will be determined by your treatment team.    Please read over the fact sheets that you  were given.

## 2022-07-03 ENCOUNTER — Encounter (HOSPITAL_COMMUNITY): Payer: Self-pay

## 2022-07-03 ENCOUNTER — Encounter (HOSPITAL_COMMUNITY)
Admission: RE | Admit: 2022-07-03 | Discharge: 2022-07-03 | Disposition: A | Discharge status: Home / Self Care | Payer: PPO | Source: Ambulatory Visit | Attending: Neurosurgery | Admitting: Neurosurgery

## 2022-07-03 ENCOUNTER — Other Ambulatory Visit: Payer: Self-pay

## 2022-07-03 VITALS — BP 126/69 | HR 88 | Temp 98.6°F | Resp 17 | Ht 64.5 in | Wt 184.9 lb

## 2022-07-03 DIAGNOSIS — I10 Essential (primary) hypertension: Secondary | ICD-10-CM | POA: Diagnosis not present

## 2022-07-03 DIAGNOSIS — Z01818 Encounter for other preprocedural examination: Secondary | ICD-10-CM

## 2022-07-03 DIAGNOSIS — M5030 Other cervical disc degeneration, unspecified cervical region: Secondary | ICD-10-CM | POA: Insufficient documentation

## 2022-07-03 DIAGNOSIS — Z01812 Encounter for preprocedural laboratory examination: Secondary | ICD-10-CM | POA: Diagnosis not present

## 2022-07-03 HISTORY — DX: Anemia, unspecified: D64.9

## 2022-07-03 HISTORY — DX: Chronic obstructive pulmonary disease, unspecified: J44.9

## 2022-07-03 HISTORY — DX: Malignant (primary) neoplasm, unspecified: C80.1

## 2022-07-03 LAB — BASIC METABOLIC PANEL
Anion gap: 10 (ref 5–15)
BUN: 20 mg/dL (ref 8–23)
CO2: 25 mmol/L (ref 22–32)
Calcium: 8.9 mg/dL (ref 8.9–10.3)
Chloride: 104 mmol/L (ref 98–111)
Creatinine, Ser: 0.89 mg/dL (ref 0.44–1.00)
GFR, Estimated: >60 mL/min (ref >60)
Glucose, Bld: 127 mg/dL — ABNORMAL HIGH (ref 70–99)
Potassium: 4 mmol/L (ref 3.5–5.1)
Sodium: 139 mmol/L (ref 135–145)

## 2022-07-03 LAB — CBC
HCT: 41.2 % (ref 36.0–46.0)
Hemoglobin: 12.6 g/dL (ref 12.0–15.0)
MCH: 28.3 pg (ref 26.0–34.0)
MCHC: 30.6 g/dL (ref 30.0–36.0)
MCV: 92.6 fL (ref 80.0–100.0)
Platelets: 183 10*3/uL (ref 150–400)
RBC: 4.45 MIL/uL (ref 3.87–5.11)
RDW: 14.6 % (ref 11.5–15.5)
WBC: 7.2 10*3/uL (ref 4.0–10.5)
nRBC: 0 % (ref 0.0–0.2)

## 2022-07-03 LAB — TYPE AND SCREEN
ABO/RH(D): O POS
Antibody Screen: NEGATIVE

## 2022-07-03 LAB — SURGICAL PCR SCREEN
MRSA, PCR: NEGATIVE
Staphylococcus aureus: NEGATIVE

## 2022-07-03 NOTE — Progress Notes (Addendum)
PCP - Dr. Sharlot Gowda  Cardiologist - Dr. Allyson Sabal  EP-no  Endocrine-no  Pulm- Dr. Virgel Manifold- cleared patient for surgery.  Chest x-ray - 06/22/22  EKG - 02/21/22  Stress Test - 2013  ECHO - 2023  Cardiac Cath - 2022  AICD-no PM-no LOOP-no  Nerve Stimulator-no  Sleep Study - no CPAP - no  LABS-CBC, BMP,   ASA-not taking  ERAS-yes- no beverage  HA1C-na GLP-1-no Fasting Blood Sugar - na Checks Blood Sugar __na__ times a day  Anesthesia- Mrs. Wareing was treated for Influenza A and pneumonia 05/22/22, symptoms listed 2 weeks, patient was cleared for surgery on 06/06/22, by Dr Virgel Manifold. Chest  x-ray was repeated on 06/22/22, pneumonia was cleared.   Pt denies having chest pain, sob, or fever at this time. All instructions explained to the pt, with a verbal understanding of the material. Pt agrees to go over the instructions while at home for a better understanding. The opportunity to ask questions was provided.

## 2022-07-13 NOTE — Anesthesia Preprocedure Evaluation (Addendum)
Anesthesia Evaluation  Patient identified by MRN, date of birth, ID band Patient awake    Reviewed: Allergy & Precautions, NPO status , Patient's Chart, lab work & pertinent test results  History of Anesthesia Complications (+) PONV and history of anesthetic complications (hx of urinary retention)  Airway Mallampati: II  TM Distance: >3 FB Neck ROM: Full    Dental no notable dental hx. (+) Teeth Intact   Pulmonary asthma , COPD, former smoker   Pulmonary exam normal breath sounds clear to auscultation       Cardiovascular hypertension, + angina  + CAD  Normal cardiovascular exam Rhythm:Regular Rate:Normal     Neuro/Psych  Headaches    GI/Hepatic ,GERD  Medicated and Controlled,,  Endo/Other    Renal/GU Lab Results      Component                Value               Date                      NA                       139                 07/03/2022                CL                       104                 07/03/2022                K                        4.0                 07/03/2022                CO2                      25                  07/03/2022                BUN                      20                  07/03/2022                CREATININE               0.89                07/03/2022                GFRNONAA                 >60                 07/03/2022                CALCIUM                  8.9  07/03/2022                ALBUMIN                  4.7                 12/16/2020                GLUCOSE                  127 (H)             07/03/2022                Musculoskeletal  (+) Arthritis ,  Fibromyalgia -  Abdominal   Peds  Hematology Lab Results      Component                Value               Date                      WBC                      7.2                 07/03/2022                HGB                      12.6                07/03/2022                HCT                       41.2                07/03/2022                MCV                      92.6                07/03/2022                PLT                      183                 07/03/2022              Anesthesia Other Findings   Reproductive/Obstetrics                             Anesthesia Physical Anesthesia Plan  ASA: 3  Anesthesia Plan: General   Post-op Pain Management: Precedex, Ofirmev IV (intra-op)* and Ketamine IV*   Induction: Intravenous  PONV Risk Score and Plan: 3 and Treatment may vary due to age or medical condition, Ondansetron, Dexamethasone, Aprepitant, Propofol infusion and TIVA  Airway Management Planned: Oral ETT and Video Laryngoscope Planned  Additional Equipment: None  Intra-op Plan:   Post-operative Plan: Extubation in OR  Informed Consent: I have reviewed the patients History and Physical, chart, labs and discussed the procedure including the risks, benefits and alternatives for the proposed anesthesia with the patient  or authorized representative who has indicated his/her understanding and acceptance.     Dental advisory given  Plan Discussed with:   Anesthesia Plan Comments:        Anesthesia Quick Evaluation

## 2022-07-14 ENCOUNTER — Observation Stay (HOSPITAL_COMMUNITY)
Admission: RE | Admit: 2022-07-14 | Discharge: 2022-07-14 | Disposition: A | Discharge status: Home / Self Care | Payer: PPO | Source: Ambulatory Visit | Attending: Neurosurgery | Admitting: Neurosurgery

## 2022-07-14 ENCOUNTER — Ambulatory Visit (HOSPITAL_BASED_OUTPATIENT_CLINIC_OR_DEPARTMENT_OTHER): Payer: PPO | Admitting: Certified Registered Nurse Anesthetist

## 2022-07-14 ENCOUNTER — Ambulatory Visit (HOSPITAL_COMMUNITY)
Admission: RE | Disposition: A | Discharge status: Home / Self Care | Payer: Self-pay | Source: Ambulatory Visit | Attending: Neurosurgery

## 2022-07-14 ENCOUNTER — Ambulatory Visit (HOSPITAL_COMMUNITY): Payer: PPO | Admitting: Certified Registered Nurse Anesthetist

## 2022-07-14 ENCOUNTER — Ambulatory Visit (HOSPITAL_COMMUNITY): Payer: PPO

## 2022-07-14 ENCOUNTER — Encounter (HOSPITAL_COMMUNITY): Payer: Self-pay | Admitting: Neurosurgery

## 2022-07-14 ENCOUNTER — Other Ambulatory Visit: Payer: Self-pay

## 2022-07-14 DIAGNOSIS — Z87891 Personal history of nicotine dependence: Secondary | ICD-10-CM | POA: Insufficient documentation

## 2022-07-14 DIAGNOSIS — I10 Essential (primary) hypertension: Secondary | ICD-10-CM

## 2022-07-14 DIAGNOSIS — Z981 Arthrodesis status: Secondary | ICD-10-CM | POA: Insufficient documentation

## 2022-07-14 DIAGNOSIS — I25119 Atherosclerotic heart disease of native coronary artery with unspecified angina pectoris: Secondary | ICD-10-CM | POA: Diagnosis not present

## 2022-07-14 DIAGNOSIS — M47812 Spondylosis without myelopathy or radiculopathy, cervical region: Secondary | ICD-10-CM

## 2022-07-14 DIAGNOSIS — J4530 Mild persistent asthma, uncomplicated: Secondary | ICD-10-CM | POA: Diagnosis not present

## 2022-07-14 DIAGNOSIS — Z01818 Encounter for other preprocedural examination: Secondary | ICD-10-CM

## 2022-07-14 DIAGNOSIS — J449 Chronic obstructive pulmonary disease, unspecified: Secondary | ICD-10-CM | POA: Diagnosis not present

## 2022-07-14 DIAGNOSIS — M4322 Fusion of spine, cervical region: Secondary | ICD-10-CM | POA: Diagnosis not present

## 2022-07-14 DIAGNOSIS — M4722 Other spondylosis with radiculopathy, cervical region: Secondary | ICD-10-CM | POA: Diagnosis not present

## 2022-07-14 DIAGNOSIS — E785 Hyperlipidemia, unspecified: Secondary | ICD-10-CM | POA: Diagnosis not present

## 2022-07-14 HISTORY — PX: ANTERIOR CERVICAL DECOMP/DISCECTOMY FUSION: SHX1161

## 2022-07-14 SURGERY — ANTERIOR CERVICAL DECOMPRESSION/DISCECTOMY FUSION 1 LEVEL
Anesthesia: General | Site: Spine Cervical

## 2022-07-14 MED ORDER — ORAL CARE MOUTH RINSE: 15.0000 mL | Freq: Once | OROMUCOSAL | Status: AC

## 2022-07-14 MED ORDER — GABAPENTIN 600 MG PO TABS: 1200.0000 mg | ORAL_TABLET | ORAL | Status: DC

## 2022-07-14 MED ORDER — ACETAMINOPHEN 325 MG PO TABS: 650.0000 mg | ORAL_TABLET | ORAL | Status: DC | PRN

## 2022-07-14 MED ORDER — DEXMEDETOMIDINE HCL IN NACL 80 MCG/20ML IV SOLN
INTRAVENOUS | Status: DC | PRN
  Administered 2022-07-14: 8 ug via INTRAVENOUS
  Administered 2022-07-14: 4 ug via INTRAVENOUS

## 2022-07-14 MED ORDER — MONTELUKAST SODIUM 10 MG PO TABS
10.0000 mg | ORAL_TABLET | Freq: Every day | ORAL | Status: DC
  Filled 2022-07-14: qty 1

## 2022-07-14 MED ORDER — CHLORHEXIDINE GLUCONATE 0.12 % MT SOLN: 15.0000 mL | Freq: Once | OROMUCOSAL | Status: AC

## 2022-07-14 MED ORDER — APREPITANT 40 MG PO CAPS: 40.0000 mg | ORAL_CAPSULE | Freq: Once | ORAL | Status: AC

## 2022-07-14 MED ORDER — SODIUM CHLORIDE 0.9% FLUSH: 3.0000 mL | INTRAVENOUS | Status: DC | PRN

## 2022-07-14 MED ORDER — POTASSIUM CHLORIDE IN NACL 20-0.9 MEQ/L-% IV SOLN: INTRAVENOUS | Status: DC

## 2022-07-14 MED ORDER — POLYETHYL GLYCOL-PROPYL GLYCOL 0.4-0.3 % OP GEL
Freq: Two times a day (BID) | OPHTHALMIC | Status: DC
Start: 2022-07-14 — End: 2022-07-14

## 2022-07-14 MED ORDER — ONDANSETRON HCL 4 MG PO TABS: 4.0000 mg | ORAL_TABLET | Freq: Four times a day (QID) | ORAL | Status: DC | PRN

## 2022-07-14 MED ORDER — KETAMINE HCL 10 MG/ML IJ SOLN
INTRAMUSCULAR | Status: DC | PRN
  Administered 2022-07-14: 5 mg via INTRAVENOUS
  Administered 2022-07-14: 10 mg via INTRAVENOUS

## 2022-07-14 MED ORDER — AMLODIPINE BESYLATE 5 MG PO TABS: 5.0000 mg | ORAL_TABLET | Freq: Every day | ORAL | Status: DC

## 2022-07-14 MED ORDER — ONDANSETRON HCL 4 MG/2ML IJ SOLN: 4.0000 mg | Freq: Once | INTRAMUSCULAR | Status: DC | PRN

## 2022-07-14 MED ORDER — LACTATED RINGERS IV SOLN: INTRAVENOUS | Status: DC

## 2022-07-14 MED ORDER — CHLORHEXIDINE GLUCONATE CLOTH 2 % EX PADS: 6.0000 | MEDICATED_PAD | Freq: Once | CUTANEOUS | Status: DC

## 2022-07-14 MED ORDER — THROMBIN 5000 UNITS EX SOLR
CUTANEOUS | Status: AC
  Filled 2022-07-14: qty 10000

## 2022-07-14 MED ORDER — ACETAMINOPHEN 10 MG/ML IV SOLN: 1000.0000 mg | Freq: Once | INTRAVENOUS | Status: DC | PRN

## 2022-07-14 MED ORDER — CEFAZOLIN SODIUM-DEXTROSE 2-4 GM/100ML-% IV SOLN
INTRAVENOUS | Status: AC
  Filled 2022-07-14: qty 100

## 2022-07-14 MED ORDER — PHENYLEPHRINE HCL-NACL 20-0.9 MG/250ML-% IV SOLN
INTRAVENOUS | Status: DC | PRN
  Administered 2022-07-14: 50 ug/min via INTRAVENOUS

## 2022-07-14 MED ORDER — PHENOL 1.4 % MT LIQD
1.0000 | OROMUCOSAL | Status: DC | PRN
  Administered 2022-07-14: 1 via OROMUCOSAL
  Filled 2022-07-14: qty 177

## 2022-07-14 MED ORDER — ACETAMINOPHEN 10 MG/ML IV SOLN
INTRAVENOUS | Status: DC | PRN
  Administered 2022-07-14: 1000 mg via INTRAVENOUS

## 2022-07-14 MED ORDER — LIDOCAINE 2% (20 MG/ML) 5 ML SYRINGE
INTRAMUSCULAR | Status: DC | PRN
  Administered 2022-07-14: 50 mg via INTRAVENOUS

## 2022-07-14 MED ORDER — GABAPENTIN 400 MG PO CAPS: 1800.0000 mg | ORAL_CAPSULE | Freq: Every day | ORAL | Status: DC

## 2022-07-14 MED ORDER — ROCURONIUM BROMIDE 10 MG/ML (PF) SYRINGE
PREFILLED_SYRINGE | INTRAVENOUS | Status: DC | PRN
  Administered 2022-07-14 (×2): 50 mg via INTRAVENOUS

## 2022-07-14 MED ORDER — ACETAMINOPHEN 650 MG RE SUPP: 650.0000 mg | RECTAL | Status: DC | PRN

## 2022-07-14 MED ORDER — ALBUTEROL SULFATE HFA 108 (90 BASE) MCG/ACT IN AERS: 2.0000 | INHALATION_SPRAY | Freq: Four times a day (QID) | RESPIRATORY_TRACT | Status: DC | PRN

## 2022-07-14 MED ORDER — SODIUM CHLORIDE 0.9% FLUSH: 3.0000 mL | Freq: Two times a day (BID) | INTRAVENOUS | Status: DC

## 2022-07-14 MED ORDER — PROPOFOL 10 MG/ML IV BOLUS
INTRAVENOUS | Status: DC | PRN
Start: 2022-07-14 — End: 2022-07-14
  Administered 2022-07-14: 100 mg via INTRAVENOUS

## 2022-07-14 MED ORDER — METOPROLOL TARTRATE 25 MG PO TABS: 25.0000 mg | ORAL_TABLET | Freq: Two times a day (BID) | ORAL | Status: DC

## 2022-07-14 MED ORDER — MENTHOL 3 MG MT LOZG: 1.0000 | LOZENGE | OROMUCOSAL | Status: DC | PRN

## 2022-07-14 MED ORDER — ATORVASTATIN CALCIUM 10 MG PO TABS: 20.0000 mg | ORAL_TABLET | Freq: Every day | ORAL | Status: DC

## 2022-07-14 MED ORDER — CHLORHEXIDINE GLUCONATE 0.12 % MT SOLN
OROMUCOSAL | Status: AC
  Administered 2022-07-14: 15 mL via OROMUCOSAL
  Filled 2022-07-14: qty 15

## 2022-07-14 MED ORDER — DEXAMETHASONE SODIUM PHOSPHATE 10 MG/ML IJ SOLN
INTRAMUSCULAR | Status: DC | PRN
  Administered 2022-07-14: 10 mg via INTRAVENOUS

## 2022-07-14 MED ORDER — PROPOFOL 10 MG/ML IV BOLUS
INTRAVENOUS | Status: AC
  Filled 2022-07-14: qty 20

## 2022-07-14 MED ORDER — PANTOPRAZOLE SODIUM 40 MG PO TBEC: 40.0000 mg | DELAYED_RELEASE_TABLET | Freq: Every day | ORAL | Status: DC

## 2022-07-14 MED ORDER — 0.9 % SODIUM CHLORIDE (POUR BTL) OPTIME
TOPICAL | Status: DC | PRN
  Administered 2022-07-14: 1000 mL

## 2022-07-14 MED ORDER — OXYCODONE HCL 5 MG PO TABS: 5.0000 mg | ORAL_TABLET | ORAL | Status: DC | PRN

## 2022-07-14 MED ORDER — APREPITANT 40 MG PO CAPS
ORAL_CAPSULE | ORAL | Status: AC
  Administered 2022-07-14: 40 mg via ORAL
  Filled 2022-07-14: qty 1

## 2022-07-14 MED ORDER — SUGAMMADEX SODIUM 200 MG/2ML IV SOLN
INTRAVENOUS | Status: DC | PRN
  Administered 2022-07-14: 200 mg via INTRAVENOUS

## 2022-07-14 MED ORDER — GABAPENTIN 400 MG PO CAPS: 1200.0000 mg | ORAL_CAPSULE | Freq: Every morning | ORAL | Status: DC

## 2022-07-14 MED ORDER — LIDOCAINE-EPINEPHRINE 0.5 %-1:200000 IJ SOLN
INTRAMUSCULAR | Status: DC | PRN
  Administered 2022-07-14: 3 mL

## 2022-07-14 MED ORDER — FLUTICASONE PROPIONATE 50 MCG/ACT NA SUSP
1.0000 | Freq: Every day | NASAL | Status: DC
  Filled 2022-07-14: qty 16

## 2022-07-14 MED ORDER — ONDANSETRON HCL 4 MG/2ML IJ SOLN
INTRAMUSCULAR | Status: DC | PRN
  Administered 2022-07-14: 4 mg via INTRAVENOUS

## 2022-07-14 MED ORDER — FENTANYL CITRATE (PF) 250 MCG/5ML IJ SOLN
INTRAMUSCULAR | Status: DC | PRN
  Administered 2022-07-14 (×2): 50 ug via INTRAVENOUS

## 2022-07-14 MED ORDER — FENTANYL CITRATE (PF) 100 MCG/2ML IJ SOLN
INTRAMUSCULAR | Status: AC
  Administered 2022-07-14: 25 ug via INTRAVENOUS
  Filled 2022-07-14: qty 2

## 2022-07-14 MED ORDER — LIDOCAINE-EPINEPHRINE 0.5 %-1:200000 IJ SOLN
INTRAMUSCULAR | Status: AC
  Filled 2022-07-14: qty 50

## 2022-07-14 MED ORDER — PROPOFOL 500 MG/50ML IV EMUL
INTRAVENOUS | Status: DC | PRN
  Administered 2022-07-14: 125 ug/kg/min via INTRAVENOUS
  Administered 2022-07-14: 150 ug/kg/min via INTRAVENOUS

## 2022-07-14 MED ORDER — DIAZEPAM 5 MG PO TABS
5.0000 mg | ORAL_TABLET | Freq: Four times a day (QID) | ORAL | Status: DC | PRN
  Administered 2022-07-14: 5 mg via ORAL
  Filled 2022-07-14: qty 1

## 2022-07-14 MED ORDER — HYDROMORPHONE HCL 2 MG PO TABS
4.0000 mg | ORAL_TABLET | ORAL | Status: DC | PRN
  Administered 2022-07-14 (×2): 4 mg via ORAL
  Filled 2022-07-14 (×2): qty 2

## 2022-07-14 MED ORDER — ONDANSETRON HCL 4 MG/2ML IJ SOLN: 4.0000 mg | Freq: Four times a day (QID) | INTRAMUSCULAR | Status: DC | PRN

## 2022-07-14 MED ORDER — FENTANYL CITRATE (PF) 250 MCG/5ML IJ SOLN
INTRAMUSCULAR | Status: AC
  Filled 2022-07-14: qty 5

## 2022-07-14 MED ORDER — NITROGLYCERIN 0.4 MG SL SUBL: 0.4000 mg | SUBLINGUAL_TABLET | SUBLINGUAL | Status: DC | PRN

## 2022-07-14 MED ORDER — ALBUTEROL SULFATE (2.5 MG/3ML) 0.083% IN NEBU: 2.5000 mg | INHALATION_SOLUTION | Freq: Four times a day (QID) | RESPIRATORY_TRACT | Status: DC | PRN

## 2022-07-14 MED ORDER — CEFAZOLIN SODIUM-DEXTROSE 2-4 GM/100ML-% IV SOLN
2.0000 g | INTRAVENOUS | Status: AC
  Administered 2022-07-14: 2 g via INTRAVENOUS

## 2022-07-14 MED ORDER — THROMBIN (RECOMBINANT) 5000 UNITS EX SOLR
CUTANEOUS | Status: DC | PRN
  Administered 2022-07-14: 1 via TOPICAL

## 2022-07-14 MED ORDER — KETAMINE HCL 50 MG/5ML IJ SOSY
PREFILLED_SYRINGE | INTRAMUSCULAR | Status: AC
  Filled 2022-07-14: qty 5

## 2022-07-14 MED ORDER — FENTANYL CITRATE (PF) 100 MCG/2ML IJ SOLN: 25.0000 ug | INTRAMUSCULAR | Status: DC | PRN

## 2022-07-14 MED ORDER — SODIUM CHLORIDE 0.9 % IV SOLN
250.0000 mL | INTRAVENOUS | Status: DC
  Administered 2022-07-14: 250 mL via INTRAVENOUS

## 2022-07-14 SURGICAL SUPPLY — 48 items
ADH SKN CLS APL DERMABOND .7 (GAUZE/BANDAGES/DRESSINGS) ×1
BAG COUNTER SPONGE SURGICOUNT (BAG) ×1 IMPLANT
BAG SPNG CNTER NS LX DISP (BAG) ×1
BLADE CLIPPER SURG (BLADE) IMPLANT
BONE SPACER C-PLUG 14X8X12 (Bone Implant) IMPLANT
BUR DRUM 4.0 (BURR) ×1 IMPLANT
BUR MATCHSTICK NEURO 3.0 LAGG (BURR) ×1 IMPLANT
CANISTER SUCT 3000ML PPV (MISCELLANEOUS) ×1 IMPLANT
DERMABOND ADVANCED .7 DNX12 (GAUZE/BANDAGES/DRESSINGS) ×1 IMPLANT
DRAPE HALF SHEET 40X57 (DRAPES) IMPLANT
DRAPE LAPAROTOMY 100X72 PEDS (DRAPES) ×1 IMPLANT
DRAPE MICROSCOPE SLANT 54X150 (MISCELLANEOUS) ×1 IMPLANT
DURAPREP 6ML APPLICATOR 50/CS (WOUND CARE) ×1 IMPLANT
ELECT COATED BLADE 2.86 ST (ELECTRODE) ×1 IMPLANT
ELECT REM PT RETURN 9FT ADLT (ELECTROSURGICAL) ×1
ELECTRODE REM PT RTRN 9FT ADLT (ELECTROSURGICAL) ×1 IMPLANT
GAUZE 4X4 16PLY ~~LOC~~+RFID DBL (SPONGE) IMPLANT
GLOVE ECLIPSE 6.5 STRL STRAW (GLOVE) ×1 IMPLANT
GLOVE EXAM NITRILE XL STR (GLOVE) IMPLANT
GOWN STRL REUS W/ TWL LRG LVL3 (GOWN DISPOSABLE) ×2 IMPLANT
GOWN STRL REUS W/ TWL XL LVL3 (GOWN DISPOSABLE) IMPLANT
GOWN STRL REUS W/TWL 2XL LVL3 (GOWN DISPOSABLE) IMPLANT
GOWN STRL REUS W/TWL LRG LVL3 (GOWN DISPOSABLE) ×2
GOWN STRL REUS W/TWL XL LVL3 (GOWN DISPOSABLE)
KIT BASIN OR (CUSTOM PROCEDURE TRAY) ×1 IMPLANT
KIT TURNOVER KIT B (KITS) ×1 IMPLANT
NDL HYPO 25X1 1.5 SAFETY (NEEDLE) ×1 IMPLANT
NDL SPNL 22GX3.5 QUINCKE BK (NEEDLE) ×1 IMPLANT
NEEDLE HYPO 25X1 1.5 SAFETY (NEEDLE) ×1 IMPLANT
NEEDLE SPNL 22GX3.5 QUINCKE BK (NEEDLE) ×1 IMPLANT
NS IRRIG 1000ML POUR BTL (IV SOLUTION) ×1 IMPLANT
PACK LAMINECTOMY NEURO (CUSTOM PROCEDURE TRAY) ×1 IMPLANT
PAD ARMBOARD 7.5X6 YLW CONV (MISCELLANEOUS) ×3 IMPLANT
PIN DISTRACTION 14MM (PIN) IMPLANT
PLATE ACP 1.6X24 1LVL (Plate) IMPLANT
SCREW ACP ST VARI 3.5X13 (Screw) IMPLANT
SCREW ACP VA ST 3.5X15 (Screw) IMPLANT
SOL ELECTROSURG ANTI STICK (MISCELLANEOUS) ×1
SOLUTION ELECTROSURG ANTI STCK (MISCELLANEOUS) ×1 IMPLANT
SPIKE FLUID TRANSFER (MISCELLANEOUS) ×1 IMPLANT
SPONGE INTESTINAL PEANUT (DISPOSABLE) ×1 IMPLANT
SPONGE SURGIFOAM ABS GEL SZ50 (HEMOSTASIS) ×1 IMPLANT
SUT VIC AB 0 CT1 27 (SUTURE)
SUT VIC AB 0 CT1 27XBRD ANTBC (SUTURE) IMPLANT
SUT VIC AB 3-0 SH 8-18 (SUTURE) ×1 IMPLANT
TOWEL GREEN STERILE (TOWEL DISPOSABLE) ×1 IMPLANT
TOWEL GREEN STERILE FF (TOWEL DISPOSABLE) ×1 IMPLANT
WATER STERILE IRR 1000ML POUR (IV SOLUTION) ×1 IMPLANT

## 2022-07-14 NOTE — Anesthesia Postprocedure Evaluation (Signed)
Anesthesia Post Note  [name]  Procedure(s) Performed: Cervical three-four Anterior Cervical Decompression Fusion (Spine Cervical)     Patient location during evaluation: PACU Anesthesia Type: General Level of consciousness: awake and alert Pain management: pain level controlled Vital Signs Assessment: post-procedure vital signs reviewed and stable Respiratory status: spontaneous breathing, nonlabored ventilation, respiratory function stable and patient connected to nasal cannula oxygen Cardiovascular status: blood pressure returned to baseline and stable Postop Assessment: no apparent nausea or vomiting Anesthetic complications: yes   Encounter Notable Events  Notable Event Outcome Phase Comment  Difficult to intubate - expected  Intraprocedure Filed from anesthesia note documentation.    Last Vitals:  Vitals:   07/14/22 1115 07/14/22 1155  BP: 117/68 118/68  Pulse: 72 (!) 53  Resp: 15 20  Temp:  37.2 C  SpO2: 94% 91%    Last Pain:  Vitals:   07/14/22 1155  TempSrc:   PainSc: 7                  Trevor Iha

## 2022-07-14 NOTE — Op Note (Signed)
07/14/2022  6:58 PM  PATIENT:  [name] Long  82 y.o. female  PRE-OPERATIVE DIAGNOSIS:  Osteoarthritis of facet joint of cervical spine C3/4 POST-OPERATIVE DIAGNOSIS:  Osteoarthritis of facet joint of cervical spine C3/4 PROCEDURE:  Anterior Cervical decompression C3/4 Arthrodesis C3-4 with 8mm structural allograft Anterior instrumentation(ACP, nuvasive) C3-4  SURGEON:   Surgeon(s): Coletta Memos, MD   ASSISTANTS:none  ANESTHESIA:   general  EBL:  Total I/O In: 2020 [P.O.:720; I.V.:1200; IV Piggyback:100] Out: 230 [Urine:200; Blood:30]  BLOOD ADMINISTERED:none  CELL SAVER GIVEN:none  COUNT:per nursing  DRAINS: none   SPECIMEN:  No Specimen  DICTATION: [name] was taken to the operating room, intubated, and placed under general anesthesia without difficulty. She was positioned supine with her head in slight extension on a horseshoe headrest. The neck was prepped and draped in a sterile manner. I infiltrated 3 cc's 1/2%lidocaine/1:200,000 strength epinephrine into the planned incision starting from the midline to the medial border of the left sternocleidomastoid muscle. I opened the incision with a 10 blade and dissected sharply through soft tissue to the platysma. I dissected in the plane superior to the platysma both rostrally and caudally. I then opened the platysma in a horizontal fashion with Metzenbaum scissors, and dissected in the inferior plane rostrally and caudally. With both blunt and sharp technique I created an avascular corridor to the cervical spine. I placed a spinal needle(s) in the disc space at C3/4 . I then reflected the longus colli from C3 to C4 and placed self retaining retractors. I opened the disc space(s) at C3/4 with a 15 blade. I removed disc with curettes, Kerrison punches, and the drill. Using the drill I removed osteophytes and prepared for the decompression.  I decompressed the spinal canal and the C4 root(s) with the drill, Kerrison punches, and  the curettes. I used the microscope to aid in microdissection. I removed the posterior longitudinal ligament to fully expose and decompress the thecal sac. I exposed the roots laterally taking down the C3/4 uncovertebral joints. With the decompression complete I moved on to the arthrodesis. I used the drill to level the surfaces of C3, and 4. I removed soft tissue to prepare the disc space and the bony surfaces. I measured the space and placed an 8mm structural allograft into the disc space.  At Cervical three/four then placed the anterior instrumentation. I placed 2 screws in each vertebral body through the plate. I locked the screws into place. Intraoperative xray showed the graft, plate, and screws to be in good position. I irrigated the wound, achieved hemostasis, and closed the wound in layers. I approximated the platysma, and the subcuticular plane with vicryl sutures. I used Dermabond for a sterile dressing.   PLAN OF CARE: Admit for overnight observation  PATIENT DISPOSITION:  PACU - hemodynamically stable.   Delay start of Pharmacological VTE agent (>24hrs) due to surgical blood loss or risk of bleeding:  yes

## 2022-07-14 NOTE — Discharge Summary (Signed)
BP 131/80 (BP Location: Right Arm)   Pulse 84   Temp 98.8 F (37.1 C)   Resp 19   Ht 5' 4.58" (1.64 m)   Wt 82.6 kg   SpO2 92%   BMI 30.68 kg/m  Physician Discharge Summary  Patient ID: [name] Long MRN: [id number] DOB/AGE: [birth year] 82 y.o.  Admit date: 07/14/2022 Discharge date: 07/14/2022  Admission Diagnoses:  Discharge Diagnoses:  Principal Problem:   S/P cervical spinal fusion   Discharged Condition: good  Hospital Course: [name] was taken to the operating room for an uncomplicated ACDF at C3/4. I used a nuvasive acp plate, and an 8mm graft. Post op her voice is strong, normal strength, ambulating, and tolerating a regular diet. The incision is clean and dry. Seen and cleared by PT  Treatments: surgery: ACDF C3/4  Discharge Exam: Blood pressure 131/80, pulse 84, temperature 98.8 F (37.1 C), resp. rate 19, height 5' 4.58" (1.64 m), weight 82.6 kg, SpO2 92%. General appearance: alert, cooperative, appears stated age, and no distress Neurologic: Alert and oriented X 3, normal strength and tone. Normal symmetric reflexes. Normal coordination and gait  Disposition: Discharge disposition: 01-Home or Self Care      Osteoarthritis of facet joint of cervical spine  Allergies as of 07/14/2022       Reactions   Dimenhydrinate Other (See Comments)   "locks up kidneys"   Morphine And Codeine Nausea And Vomiting   Percocet [oxycodone-acetaminophen] Other (See Comments)   hallucinations   Zolpidem Other (See Comments)   Unusual behavior        Medication List     TAKE these medications    acetaminophen 500 MG tablet Commonly known as: TYLENOL Take 500 mg by mouth every 6 (six) hours as needed for moderate pain.   albuterol 108 (90 Base) MCG/ACT inhaler Commonly known as: Proventil HFA INHALE 2 PUFF AS DIRECTED FOUR TIMES A DAY AS NEEDED   amLODipine 5 MG tablet Commonly known as: NORVASC Take 1 tablet (5 mg total) by mouth daily.   aspirin EC 81 MG  tablet Take 81 mg by mouth daily. Swallow whole.   atorvastatin 20 MG tablet Commonly known as: LIPITOR Take 1 tablet (20 mg total) by mouth daily.   Breztri Aerosphere 160-9-4.8 MCG/ACT Aero Generic drug: Budeson-Glycopyrrol-Formoterol Inhale 2 puffs into the lungs in the morning and at bedtime.   CALTRATE 600+D3 PO Take 1 tablet by mouth daily.   carisoprodol 350 MG tablet Commonly known as: SOMA Take 350 mg by mouth every 8 (eight) hours as needed (for arthritis pain).   cetirizine 10 MG tablet Commonly known as: ZYRTEC Take 10 mg by mouth daily as needed for allergies.   esomeprazole 20 MG capsule Commonly known as: NEXIUM Take 20 mg by mouth daily at 12 noon.   fluticasone 50 MCG/ACT nasal spray Commonly known as: FLONASE Place 1 spray into both nostrils daily.   gabapentin 600 MG tablet Commonly known as: NEURONTIN Take 1,200-1,800 mg by mouth See admin instructions. Take 2 tablets (1200 mg) by mouth in the morning & take 3 tablets (1800 mg) by mouth at night   HYDROmorphone 4 MG tablet Commonly known as: DILAUDID Take 1 tablet (4 mg total) by mouth every 4 (four) hours as needed for severe pain.   magnesium oxide 400 MG tablet Commonly known as: MAG-OX Take 400 mg by mouth at bedtime.   Melatonin 10 MG Caps Take 10 mg by mouth at bedtime as needed (sleep).  metoprolol tartrate 25 MG tablet Commonly known as: LOPRESSOR Take 1 tablet (25 mg total) by mouth 2 (two) times daily.   montelukast 10 MG tablet Commonly known as: SINGULAIR Take 1 tablet (10 mg total) by mouth daily.   nitroGLYCERIN 0.4 MG SL tablet Commonly known as: NITROSTAT Place 1 tablet (0.4 mg total) under the tongue every 5 (five) minutes x 3 doses as needed for chest pain.   PRESERVISION AREDS 2 PO Take 1 tablet by mouth in the morning and at bedtime.   Repatha SureClick 140 MG/ML Soaj Generic drug: Evolocumab Inject 140 mg into the skin every 14 (fourteen) days.   silodosin 8 MG  Caps capsule Commonly known as: RAPAFLO Take 1 capsule (8 mg total) by mouth daily.   SYSTANE OP Place 1 drop into both eyes 2 (two) times daily.   Vitamin D 50 MCG (2000 UT) tablet Take 2,000 Units by mouth daily.        Follow-up Information     Coletta Memos, MD Follow up.   Specialty: Neurosurgery Why: keep your scheduled appointment Contact information: [address] [place] [address] [phone]                 Signed: Coletta Memos 07/14/2022, 6:54 PM

## 2022-07-14 NOTE — Anesthesia Procedure Notes (Signed)
Procedure Name: Intubation Date/Time: 07/14/2022 8:38 AM  Performed by: Samara Deist, CRNAPre-anesthesia Checklist: Patient identified, Emergency Drugs available, Suction available and Patient being monitored Patient Re-evaluated:Patient Re-evaluated prior to induction Oxygen Delivery Method: Circle System Utilized Preoxygenation: Pre-oxygenation with 100% oxygen Induction Type: IV induction Ventilation: Mask ventilation without difficulty and Oral airway inserted - appropriate to patient size Laryngoscope Size: Glidescope and 4 Grade View: Grade II Tube type: Oral Tube size: 7.0 mm Number of attempts: 1 Airway Equipment and Method: Stylet, Oral airway and Video-laryngoscopy Placement Confirmation: ETT inserted through vocal cords under direct vision, positive ETCO2 and breath sounds checked- equal and bilateral Secured at: 21 cm Tube secured with: Tape Dental Injury: Teeth and Oropharynx as per pre-operative assessment  Difficulty Due To: Difficulty was anticipated and Difficult Airway- due to anterior larynx

## 2022-07-14 NOTE — Transfer of Care (Signed)
Immediate Anesthesia Transfer of Care Note  [name]  Procedure(s) Performed: Cervical three-four Anterior Cervical Decompression Fusion (Spine Cervical)  Patient Location: PACU  Anesthesia Type:General  Level of Consciousness: drowsy  Airway & Oxygen Therapy: Patient Spontanous Breathing and Patient connected to face mask oxygen  Post-op Assessment: Report given to RN and Post -op Vital signs reviewed and stable  Post vital signs: Reviewed and stable  Last Vitals:  Vitals Value Taken Time  BP 113/64 07/14/22 1039  Temp    Pulse 77 07/14/22 1045  Resp 13 07/14/22 1045  SpO2 95 % 07/14/22 1045  Vitals shown include unfiled device data.  Last Pain:  Vitals:   07/14/22 0707  TempSrc:   PainSc: 7          Complications:  Encounter Notable Events  Notable Event Outcome Phase Comment  Difficult to intubate - expected  Intraprocedure Filed from anesthesia note documentation.

## 2022-07-14 NOTE — Evaluation (Signed)
Occupational Therapy Evaluation Patient Details Name: [name] Long MRN: [id number] DOB: October 10, 1940 Today's Date: 07/14/2022   History of Present Illness 82 yo F adm for ACDF.  PMH includes: MI, CABG, R THA, R TKA, and Fibromyalgia.   Clinical Impression   Patient admitted for the procedure above.  PTA she lives at home with her spouse, but remains very independent with ADL, iADL and mobility.  She will use a SPC for uneven surfaces, but otherwise needs no DME.  Patient has had multiple ACDF's and has a thorough understanding of all precaution.  Patient able to mobilize in the halls without assist, and patient wanting to wait for ADL until she is ready to discharge.  Spouse is at the house, and can assist as needed.  No further OT needs in the acute setting, recommend follow up with MD as prescribed.        Recommendations for follow up therapy are one component of a multi-disciplinary discharge planning process, led by the attending physician.  Recommendations may be updated based on patient status, additional functional criteria and insurance authorization.   Assistance Recommended at Discharge Set up Supervision/Assistance  Patient can return home with the following Assist for transportation;Assistance with cooking/housework    Functional Status Assessment  Patient has not had a recent decline in their functional status  Equipment Recommendations  None recommended by OT    Recommendations for Other Services       Precautions / Restrictions Precautions Precautions: Cervical Precaution Booklet Issued: Yes (comment) Restrictions Weight Bearing Restrictions: No      Mobility Bed Mobility Overal bed mobility: Modified Independent                  Transfers Overall transfer level: Modified independent Equipment used: Straight cane                      Balance Overall balance assessment: Mild deficits observed, not formally tested                                          ADL either performed or assessed with clinical judgement   ADL Overall ADL's : At baseline                                             Vision Patient Visual Report: No change from baseline       Perception     Praxis      Pertinent Vitals/Pain Pain Assessment Pain Assessment: Faces Faces Pain Scale: Hurts even more Pain Location: Neck Pain Descriptors / Indicators: Sharp, Spasm Pain Intervention(s): Monitored during session     Hand Dominance Right   Extremity/Trunk Assessment Upper Extremity Assessment Upper Extremity Assessment: Overall WFL for tasks assessed   Lower Extremity Assessment Lower Extremity Assessment: Overall WFL for tasks assessed   Cervical / Trunk Assessment Cervical / Trunk Assessment: Neck Surgery   Communication Communication Communication: No difficulties   Cognition Arousal/Alertness: Awake/alert Behavior During Therapy: WFL for tasks assessed/performed Overall Cognitive Status: Within Functional Limits for tasks assessed  General Comments   VSS on RA    Exercises     Shoulder Instructions      Home Living Family/patient expects to be discharged to:: Private residence Living Arrangements: Spouse/significant other Available Help at Discharge: Family;Available 24 hours/day Type of Home: House Home Access: Stairs to enter [employer] of Steps: 3 Entrance Stairs-Rails: None Home Layout: Two level;Able to live on main level with bedroom/bathroom     Bathroom Shower/Tub: Producer, television/film/video: Standard Bathroom Accessibility: Yes How Accessible: Accessible via walker Home Equipment: Agricultural consultant (2 wheels);BSC/3in1;Shower seat - built in;Cane - single point          Prior Functioning/Environment Prior Level of Function : Independent/Modified Independent;Driving                        OT  Problem List: Pain      OT Treatment/Interventions:      OT Goals(Current goals can be found in the care plan section) Acute Rehab OT Goals Patient Stated Goal: Return home OT Goal Formulation: With patient Time For Goal Achievement: 07/17/22 Potential to Achieve Goals: Good  OT Frequency:      Co-evaluation              AM-PAC OT "6 Clicks" Daily Activity     Outcome Measure Help from another person eating meals?: None Help from another person taking care of personal grooming?: None Help from another person toileting, which includes using toliet, bedpan, or urinal?: None Help from another person bathing (including washing, rinsing, drying)?: A Little Help from another person to put on and taking off regular upper body clothing?: None Help from another person to put on and taking off regular lower body clothing?: A Little 6 Click Score: 22   End of Session Nurse Communication: Mobility status  Activity Tolerance: Patient tolerated treatment well Patient left: in chair;with call bell/phone within reach;with family/visitor present  OT Visit Diagnosis: Unsteadiness on feet (R26.81)                Time: 1610-9604 OT Time Calculation (min): 22 min Charges:  OT General Charges $OT Visit: 1 Visit OT Evaluation $OT Eval Moderate Complexity: 1 Mod  07/14/2022  RP, OTR/L  Acute Rehabilitation Services  Office:  [phone]   Suzanna Obey 07/14/2022, 4:51 PM

## 2022-07-14 NOTE — Plan of Care (Signed)
  Problem: Education: Goal: Ability to verbalize activity precautions or restrictions will improve Outcome: Completed/Met Goal: Knowledge of the prescribed therapeutic regimen will improve Outcome: Completed/Met Goal: Understanding of discharge needs will improve Outcome: Completed/Met   Problem: Activity: Goal: Ability to avoid complications of mobility impairment will improve Outcome: Completed/Met Goal: Ability to tolerate increased activity will improve Outcome: Completed/Met Goal: Will remain free from falls Outcome: Completed/Met   Problem: Bowel/Gastric: Goal: Gastrointestinal status for postoperative course will improve Outcome: Completed/Met   Problem: Clinical Measurements: Goal: Ability to maintain clinical measurements within normal limits will improve Outcome: Completed/Met Goal: Postoperative complications will be avoided or minimized Outcome: Completed/Met Goal: Diagnostic test results will improve Outcome: Completed/Met   Problem: Pain Management: Goal: Pain level will decrease Outcome: Completed/Met   Problem: Health Behavior/Discharge Planning: Goal: Identification of resources available to assist in meeting health care needs will improve Outcome: Completed/Met   Problem: Bladder/Genitourinary: Goal: Urinary functional status for postoperative course will improve Outcome: Completed/Met  Patient alert and oriented, ambulate, void. Surgical clean and dry. D/c instructions explain and given to the patient all questions answered. Patient d/c home per order.

## 2022-07-14 NOTE — Discharge Instructions (Signed)
Wound Care Leave incision open to air. You may shower. Do not scrub directly on incision.  Do not put any creams, lotions, or ointments on incision. Activity Walk each and every day, increasing distance each day. No lifting greater than 8 lbs.  Avoid bending, arching, and twisting. No driving for 2 weeks; may ride as a passenger locally.  Diet Resume your normal diet.   Call Your Doctor If Any of These Occur Redness, drainage, or swelling at the wound.  Temperature greater than 101 degrees. Severe pain not relieved by pain medication. Incision starts to come apart. Follow Up Appt Call [phone])  for problems.  If you have any hardware placed in your spine, you will need an x-ray before your appointment.  

## 2022-07-14 NOTE — Progress Notes (Signed)
OT Cancellation Note  Patient Details Name: DONDI CLARIDA MRN: [id number] DOB: [birth year]   Cancelled Treatment:    Reason Eval/Treat Not Completed: Other (comment).  Patient with no PT needs, has all needed DME at home.    Henri Guedes D Kateria Cutrona 07/14/2022, 5:42 PM 07/14/2022  RP, OTR/L  Acute Rehabilitation Services  Office:  [phone]

## 2022-07-14 NOTE — H&P (Signed)
BP 118/70   Pulse 76   Temp 98.5 F (36.9 C) (Oral)   Resp 16   Ht 5' 4.58" (1.64 m)   Wt 82.6 kg   SpO2 92%   BMI 30.68 kg/m  [name] Long comes to me today because she says her neurologist, a Dr. Everlena Cooper, told her that her problem most likely stems from her neck.  There were no x-rays of the neck done.  There was not an imaging study of her head because she also complains of frequent and chronic headaches.  She has severe back pain, which I saw her for in 2018.  But again, she was sent today specifically for her neck without any films or seemingly to me any real reason that she would have been referred without knowing something. She weighs 184 pounds.  Temperature is 98.8, blood pressure is 132/76, pulse is 99.  Pain is 7/10.  She hears a lot of grinding whenever she tries to turn her head and that is what causes a significant amount of cervical pain.  She has no upper extremity pain.  So, this is a nonradicular phenomenon.  She has had 3 surgeries done at [facility] and she believes the last one was done in 1999.     On her registration form, she states that she has severe back pain.  She says she has not been able to sleep in a bed and has been sleeping in a chair anywhere from 2-3 years.  She has had a carpal tunnel release on the right hand, another carpal tunnel surgery the following year.  She has had an ACDF at 5-6, 6-7, and most recently at 4-5 and that was in 1999.  She has been hospitalized for pneumonia.  She takes Advair, Albuterol, Soma, Dilaudid, Singulair, she says "Castrate" (but I do not know that is the name of the medication), Aspirin and Metoprolol.  She underwent an injection at C3-4 in the facet.  Says it did nothing.  She states that at this point in time, the pain is enough that she would like it to be gone.  She has significant facet hypertrophy and osteoarthritis at C3-4.  She complains of pain in and around the shoulders, which is consistent with a C4 distribution and she would like  to proceed with an ACDF for decompression of the nerve root and spinal canal with arthrodesis.  We will get this scheduled.  She does take Plavix and will have to get permission with Dr. Nanetta Batty about stopping the Plavix.      ALLERGIES:  She has an allergy to Dimentydrinate, says it locks her up.  She has no bowel or bladder dysfunction.     REVIEW OF SYSTEMS:  Positive for joint pain dizziness, shortness of breath, neck pain and back pain.  She says that her physician, the neurologist, again, told her that the dizziness was being caused by her neck.  She has a history of arthritis, asthma, cancer, myocardial infarction, hypertension.  She also has coronary artery disease.  No problems in the family history.  She has received narcotics.     SOCIAL HISTORY:  She is married.  Does not live alone.  She does have children.  She is right handed.  She is not pregnant.  She does not use alcohol.  She is 82 years of age.  She has used tobacco, stopped smoking in 1986.     PHYSICAL EXAMINATION:  She is alert, oriented by 4.  She does use  a cane.  States that it is due to pain in the lower back.  Balance is slightly off.  Romberg otherwise is good when she got her balance.  She can toe walk.  She can heel walk.  I did not try to ask her to do a squat.  Pupils equal, round, and reactive to light.  Full extraocular movements.  Full visual fields.  Hearing intact to voice.  Uvula elevates in midline.  Shoulder shrug is normal.  Tongue protrudes in the midline.

## 2022-07-17 MED FILL — Thrombin For Soln 5000 Unit: CUTANEOUS | Qty: 2 | Status: AC

## 2022-07-18 ENCOUNTER — Encounter (HOSPITAL_COMMUNITY): Payer: Self-pay | Admitting: Neurosurgery

## 2022-08-01 DIAGNOSIS — M4722 Other spondylosis with radiculopathy, cervical region: Secondary | ICD-10-CM | POA: Diagnosis not present

## 2022-08-01 DIAGNOSIS — Z6830 Body mass index (BMI) 30.0-30.9, adult: Secondary | ICD-10-CM | POA: Diagnosis not present

## 2022-08-15 ENCOUNTER — Other Ambulatory Visit (HOSPITAL_COMMUNITY): Payer: Self-pay | Admitting: Urology

## 2022-08-15 DIAGNOSIS — C641 Malignant neoplasm of right kidney, except renal pelvis: Secondary | ICD-10-CM

## 2022-08-15 NOTE — Progress Notes (Unsigned)
Cardiology Clinic Note   Patient Name: [name] Long Date of Encounter: 08/24/2022  Primary Care Provider:  Ronnald Nian, MD Primary Cardiologist:  Nanetta Batty, MD  Patient Profile    [name] Long 82 year old female presents the clinic today for follow-up evaluation of her shortness of breath and dizziness.  Past Medical History    Past Medical History:  Diagnosis Date   Abnormal EKG 01/12/2011   Allergy    RHINITIS   Anemia    Arthritis    neck, back knees, hips   Asthma    Asthma 01/12/2011   Back pain, chronic--plans were for cervical disc surgery week of 01/16/11 01/12/2011   Blood clot in vein 2004   L leg   Blood transfusion without reported diagnosis 2004   Cancer [facility])    Chronic low back pain 2020   Tumor eemoved from kidney,  Has another one that is being monitired.   Complication of anesthesia    post op bladder retention after surgery, "can't void"   Connective tissue disease (HCC)    COPD (chronic obstructive pulmonary disease) (HCC)    Coronary artery disease    s/p CABG x 3 in 2013, hx of PCI with DES   Crescendo angina, with EKG changes, new. 01/12/2011   Dyspnea    Fibromyalgia    Fibromyalgia    GERD (gastroesophageal reflux disease)    Headache    migraine - several in a yr.    History of stress test 04/2011   Normal stress test   Hx of echocardiogram 07/19/2011   EF 50-55%. There is mild annular calcification, there is trace migtral regurgitation, there is trace tricupid regurgitation, pericardium is thick, there is no pericardial effusion.   Hyperlipidemia    Hypertension    Myocardial infarction [facility]) 2013   triple heart bypass   Neuromuscular disorder (HCC)    fibromyalgia    Osteoporosis    OSTEOPENIA   Personal history of colonic polyp - adenoma 08/05/2013   08/2013 - 7 mm adenoma - consider repeat colonoscopy 2020 (age 88)   Pneumonia 2018   numerous times, only admitted one time for pneumonia but she has had pneumonia  numerous times;   02-19-2018 END OF NOVEMBER 2019  WAS ADMITTED FOR ABOUT  A WEEK DUE TO "DOUBLE PNEUMONIA", REPORTS TODAY FEELING BACK TO HER NORMAL , DENIES SOB, O2 SAT AT 94 ROOM AIR , AFEBRILE, OTHER VSS    PONV (postoperative nausea and vomiting)    PPD positive    Right carotid bruit    Status post coronary artery bypass grafting    Urinary incontinence    post op, "sleepy bladder", urinar retention    Past Surgical History:  Procedure Laterality Date   ABDOMINAL HYSTERECTOMY  1981   ANTERIOR CERVICAL DECOMP/DISCECTOMY FUSION N/A 07/14/2022   Procedure: Cervical three-four Anterior Cervical Decompression Fusion;  Surgeon: Coletta Memos, MD;  Location: [facility] OR;  Service: Neurosurgery;  Laterality: N/A;   APPENDECTOMY  1946   BIOPSY BREAST Left 03/27/2019   Benign fibroaipose tissue no evidence of malignancy   BLADDER REPAIR  1985   BREAST SURGERY  1992   REDUCTION, x3 surgeries overall on breast for augmentation    CARDIAC CATHETERIZATION  2013   CARPAL TUNNEL RELEASE Bilateral 1988, 1999   CERVICAL FUSION   1992, 1998, 1999   CORONARY ARTERY BYPASS GRAFT  01/14/2011   Procedure: CORONARY ARTERY BYPASS GRAFTING (CABG);  Surgeon: Kathlee Nations Suann Larry, MD;  Location: [facility] OR;  Service: Open Heart Surgery;  Laterality: N/A;  CABG x three, using right greater saphenous vein harvested endoscopically   CORONARY ATHERECTOMY N/A 09/02/2020   Procedure: CORONARY ATHERECTOMY;  Surgeon: Swaziland, Peter M, MD;  Location: [facility] INVASIVE CV LAB;  Service: Cardiovascular;  Laterality: N/A;   CORONARY STENT INTERVENTION N/A 09/02/2020   Procedure: CORONARY STENT INTERVENTION;  Surgeon: Swaziland, Peter M, MD;  Location: [facility] INVASIVE CV LAB;  Service: Cardiovascular;  Laterality: N/A;   CORONARY ULTRASOUND/IVUS N/A 09/02/2020   Procedure: Intravascular Ultrasound/IVUS;  Surgeon: Swaziland, Peter M, MD;  Location: [facility] INVASIVE CV LAB;  Service: Cardiovascular;  Laterality: N/A;   EYE SURGERY Bilateral 1985   radiokerototomy    LEFT HEART CATH AND CORS/GRAFTS ANGIOGRAPHY N/A 08/31/2020   Procedure: LEFT HEART CATH AND CORS/GRAFTS ANGIOGRAPHY;  Surgeon: Lyn Records, MD;  Location: [facility] INVASIVE CV LAB;  Service: Cardiovascular;  Laterality: N/A;   LEFT HEART CATHETERIZATION WITH CORONARY ANGIOGRAM N/A 01/13/2011   Procedure: LEFT HEART CATHETERIZATION WITH CORONARY ANGIOGRAM;  Surgeon: Lennette Bihari, MD;  Location: [facility] CATH LAB;  Service: Cardiovascular;  Laterality: N/A;   PERIPHERAL INTRAVASCULAR LITHOTRIPSY  09/02/2020   Procedure: INTRAVASCULAR LITHOTRIPSY;  Surgeon: Swaziland, Peter M, MD;  Location: [facility] INVASIVE CV LAB;  Service: Cardiovascular;;   PUBOVAGINAL SLING  2002   ROBOT ASSISTED LAPAROSCOPIC NEPHRECTOMY Right 02/27/2018   Procedure: XI ROBOTIC ASSISTE PARTIAL NEPHRECTOMY;  Surgeon: Rene Paci, MD;  Location: [facility] ORS;  Service: Urology;  Laterality: Right;   STERNAL WOUND DEBRIDEMENT  07/27/2011   Procedure: STERNAL WOUND DEBRIDEMENT;  followed by wound vac Surgeon: [name], MD;  Location: [facility] OR;  Service: Open Heart Surgery;  Laterality: N/A;   TONSILLECTOMY  1952   TOTAL HIP ARTHROPLASTY Right 08/22/2016   Procedure: TOTAL HIP ARTHROPLASTY ANTERIOR APPROACH;  Surgeon: Marcene Corning, MD;  Location: [facility] OR;  Service: Orthopedics;  Laterality: Right;   TOTAL KNEE ARTHROPLASTY Right 07/22/2019   Procedure: RIGHT TOTAL KNEE ARTHROPLASTY;  Surgeon: Marcene Corning, MD;  Location: [facility] ORS;  Service: Orthopedics;  Laterality: Right;   TRIGGER FINGER RELEASE Right 2014   4th finger   TROCHANTERIC BURSA EXCISION Right    bursa removed, R hip   TUBAL LIGATION      Allergies  Allergies  Allergen Reactions   Dimenhydrinate Other (See Comments)    "locks up kidneys"   Morphine And Codeine Nausea And Vomiting   Percocet [Oxycodone-Acetaminophen] Other (See Comments)    hallucinations   Zolpidem Other (See Comments)    Unusual behavior    History of Present Illness    [name] Long has a  PMH of coronary artery disease, essential hypertension, allergic rhinitis, GERD, osteopenia, fibromyalgia, coronary artery disease status post CABG x3 in 2013.  She was seen by Nada Boozer 7/15.  During that time she was concerned about chest discomfort.  It was felt that she may have musculoskeletal pain versus pericarditis.  Echocardiogram was normal.  She was seen in follow-up by Dr. 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  She required hospitalization and debridement.  She had a wound VAC placed in her wound completely healed.  She underwent nuclear stress testing which showed a normal result and also had normal echocardiogram.  It was felt that her increase in shortness of breath was related to exacerbation of her asthma at that time.     She was admitted 08/31/2020 with chest pain and shortness of breath.  She underwent cardiac catheterization by Dr. Katrinka Blazing.  She was noted to have high-grade distal left main disease with occluded LAD after her first diagonal branch and patent LIMA-LAD, patent SVG-OM, occluded SVG-RCA and high-grade calcified proximal RCA.  She underwent complex IVUS guided orbital arthrectomy with shockwave angioplasty and received DES of her RCA by Dr. Swaziland on 09/02/2020.  She was discharged the following day.  She felt that her shortness of breath had somewhat improved.  She contacted the nurse triage line on 09/14/2021.  During that time she reported a blood pressure of 138/75 and a pulse of 88.  She noted that her legs and arm felt heavy when she would walk.  She noted no palpitations or chest pain.  She did report shortness of breath and dizziness at times.  She presented to the clinic 09/19/2021 for follow-up evaluation and stated over the last couple months she had noticed increased dyspnea on exertion and dizziness.  She noticed dizziness each time she bent down.  She had been trying to stay physically active but was somewhat limited due to her back  pain.  She had been going to the gym 3 days/week and had been trying to exercise for 30 minutes.  She reported increased dyspnea with these activities.  She did not feel that these symptoms were similar to her previous anginal symptoms.  We reviewed her previous cardiac catheterization.  I  asked her to increase her p.o. hydration, may drink sports drink,  ordered an echocardiogram, ordered CBC BMP and planned follow-up in 1-2 months.  Follow-up echocardiogram showed an EF of 55-60%, G2 DD, mildly dilated left and right atria, and no significant valvular abnormalities.  She was seen by Sharlene Dory, NP-C on 10/27/2021.  During that time she reported that her shortness of breath and dizziness had not improved.  She had a visit scheduled to see pulmonology.  Her echocardiogram was reviewed.  She expressed understanding.  She noted that she had previously completed physical therapy for vertigo.  Her PCP had wanted to send her to a specialist at [facility] which she did not want to do.  She denied chest pain and increased.  Her blood pressure was well-controlled.  She denied presyncope and syncope.  She was not noted to have lower extremity edema.  She was noted to have a right carotid bruit.  Carotid Dopplers were ordered and showed minimal 1-39% bilateral stenosis.  Repeat Doppler study was recommended for 1 year.      She presented to the clinic 12/06/21 for follow-up evaluation stated her legs felt like they have much more energy with the use of lower extremity support stockings.  She continued to be dizzy.  She was following with neurology and was planning spine surgery.  She continued to walk with a cane.  We reviewed her echocardiogram and carotid Dopplers.  She expressed understanding.  I explained that it did not appear that her dizziness is related to cardiac issues.  I provided her with Epley maneuvers, had her follow-up with Dr. Allyson Sabal as scheduled, and continued her current physical activity and diet.     She was seen in follow-up by Dr. Allyson Sabal 02/21/2022.  During that time she remained stable from a cardiac standpoint.  She was preparing for C3/C4 laminectomy.  She presents to the clinic today for follow-up evaluation and states her neck surgery went well.  Her biggest complaint today is that she has trouble sleeping.  She reports that she will not sleep all night and  stay awake all day.  She will then take melatonin and sleep well in her recliner.  Sleeping in her recliner is not new.  She has been doing this for quite some time.  She reports being very physically active doing housework and yard work.  Her blood pressure is well-controlled today at 130/80.  We reviewed her medications.  We reviewed her previous echocardiogram.  She expressed understanding.  I will refill her cardiac medications and plan follow-up in 6 months.  I will give her the sleep hygiene instructions and have her follow-up with her PCP regarding her insomnia.   Today she denies chest pain, shortness of breath, lower extremity edema, fatigue, palpitations, melena, hematuria, hemoptysis, diaphoresis, weakness, presyncope, syncope, orthopnea, and PND.    Home Medications    Prior to Admission medications   Medication Sig Start Date End Date Taking? Authorizing Provider  albuterol (PROVENTIL HFA) 108 (90 Base) MCG/ACT inhaler INHALE 2 PUFF AS DIRECTED FOUR TIMES A DAY AS NEEDED 12/16/20   Ronnald Nian, MD  amLODipine (NORVASC) 5 MG tablet Take 1 tablet (5 mg total) by mouth daily. 10/04/20   Marcelino Duster, PA  aspirin EC 81 MG tablet Take 81 mg by mouth daily. Swallow whole.    [provider]  atorvastatin (LIPITOR) 20 MG tablet Take 1 tablet (20 mg total) by mouth daily. 10/04/20 01/02/21  [facility], Roe Rutherford, PA  Calcium Carb-Cholecalciferol (CALTRATE 600+D3 PO) Take 1 tablet by mouth daily.    [provider]  carisoprodol (SOMA) 350 MG tablet Take 350 mg by mouth every 8 (eight) hours as needed (for  arthritis pain).     [provider]  clopidogrel (PLAVIX) 75 MG tablet Take 1 tablet (75 mg total) by mouth daily with breakfast. 11/18/20   [facility], Roe Rutherford, PA  Evolocumab (REPATHA SURECLICK) 140 MG/ML SOAJ Inject 140 mg into the skin every 14 (fourteen) days. 02/23/21   Runell Gess, MD  fluticasone-salmeterol (ADVAIR DISKUS) 500-50 MCG/ACT AEPB Inhale 1 puff into the lungs in the morning and at bedtime. 12/16/20   Ronnald Nian, MD  gabapentin (NEURONTIN) 600 MG tablet Take 1,200-1,800 mg by mouth See admin instructions. Take 2 tablets (1200 mg) by mouth in the morning & take 3 tablets (1800 mg) by mouth at night    [provider]  HYDROmorphone (DILAUDID) 4 MG tablet Take 1 tablet (4 mg total) by mouth every 4 (four) hours as needed for severe pain. 02/27/18   Harrie Foreman, PA-C  magnesium oxide (MAG-OX) 400 MG tablet Take 400 mg by mouth at bedtime.    [provider]  metoprolol tartrate (LOPRESSOR) 25 MG tablet Take 1 tablet (25 mg total) by mouth 2 (two) times daily. 08/31/21   Runell Gess, MD  montelukast (SINGULAIR) 10 MG tablet TAKE ONE TABLET BY MOUTH DAILY 08/04/21   Ronnald Nian, MD  Multiple Vitamins-Minerals (PRESERVISION AREDS 2 PO) Take 1 tablet by mouth in the morning and at bedtime.    [provider]  nitroGLYCERIN (NITROSTAT) 0.4 MG SL tablet Place 1 tablet (0.4 mg total) under the tongue every 5 (five) minutes x 3 doses as needed for chest pain. 07/19/21   Runell Gess, MD  silodosin (RAPAFLO) 8 MG CAPS capsule TAKE ONE CAPSULE BY MOUTH DAILY 08/17/21   Ronnald Nian, MD    Family History    Family History  Adopted: Yes  Problem Relation Age of Onset   Hypertension Brother  is adopted.   Social History    Social History   Socioeconomic History   Marital status: Married    Spouse name: Not on file   Number of children: Not on file   Years of education: Not on file   Highest education level: Not on file   Occupational History   Not on file  Tobacco Use   Smoking status: Former    Current packs/day: 0.00    Types: Cigarettes    Quit date: 01/12/1980    Years since quitting: 42.6   Smokeless tobacco: Never  Vaping Use   Vaping status: Never Used  Substance and Sexual Activity   Alcohol use: No    Alcohol/week: 0.0 standard drinks of alcohol   Drug use: No   Sexual activity: Yes  Other Topics Concern   Not on file  Social History Narrative   Right handed   Social Determinants of Health   Financial Resource Strain: Low Risk  (01/17/2022)   Overall Financial Resource Strain (CARDIA)    Difficulty of Paying Living Expenses: Not hard at all  Food Insecurity: Low Risk  (05/12/2022)   [facility]   Food vital sign    Within the past 12 months, you worried that your food would run out before you got money to buy more: Never true    Within the past 12 months, the food you bought just didn't last and you didn't have money to get more. : Never true  Transportation Needs: No Transportation Needs (05/12/2022)   [facility]   Transportation    In the past 12 months, has lack of reliable transportation kept you from medical appointments, meetings, work or from getting things needed for daily living? : No  Physical Activity: Insufficiently Active (01/17/2022)   Exercise Vital Sign    Days of Exercise per Week: 2 days    Minutes of Exercise per Session: 40 min  Stress: No Stress Concern Present (01/17/2022)   [employer] of Occupational Health - Occupational Stress Questionnaire    Feeling of Stress : Not at all  Social Connections: Not on file  Intimate Partner Violence: Not on file     Review of Systems    General:  No chills, fever, night sweats or weight changes.  Cardiovascular:  No chest pain, dyspnea on exertion, edema, orthopnea, palpitations, paroxysmal nocturnal dyspnea. Dermatological: No rash,  lesions/masses Respiratory: No cough, dyspnea Urologic: No hematuria, dysuria Abdominal:   No nausea, vomiting, diarrhea, bright red blood per rectum, melena, or hematemesis Neurologic:  No visual changes, wkns, changes in mental status. All other systems reviewed and are otherwise negative except as noted above.  Physical Exam    VS:  BP 130/80 (BP Location: Left Arm, Patient Position: Sitting, Cuff Size: Large)   Pulse 76   Wt 181 lb (82.1 kg)   BMI 30.51 kg/m  , BMI Body mass index is 30.51 kg/m. GEN: Well nourished, well developed, in no acute distress. HEENT: normal. Neck: Supple, no JVD, carotid bruits, or masses. Cardiac: RRR, no murmurs, rubs, or gallops. No clubbing, cyanosis, edema.  Radials/DP/PT 2+ and equal bilaterally.  Respiratory:  Respirations regular and unlabored, clear to auscultation bilaterally. GI: Soft, nontender, nondistended, BS + x 4. MS: no deformity or atrophy. Skin: warm and dry, no rash. Neuro:  Strength and sensation are intact. Psych: Normal affect.  Accessory Clinical Findings    Recent Labs: 10/27/2021: Magnesium 2.3; TSH 0.890 07/03/2022: BUN  20; Creatinine, Ser 0.89; Hemoglobin 12.6; Platelets 183; Potassium 4.0; Sodium 139   Recent Lipid Panel    Component Value Date/Time   CHOL 119 01/24/2022 1536   TRIG 45 01/24/2022 1536   HDL 88 01/24/2022 1536   CHOLHDL 1.4 01/24/2022 1536   CHOLHDL 2.0 09/01/2020 0241   VLDL 11 09/01/2020 0241   LDLCALC 20 01/24/2022 1536         ECG personally reviewed by me today-none today.  Echocardiogram 09/01/2020  IMPRESSIONS     1. Left ventricular ejection fraction, by estimation, is 55 to 60%. The  left ventricle has normal function. The left ventricle has no regional  wall motion abnormalities. There is mild concentric left ventricular  hypertrophy. Left ventricular diastolic  parameters are indeterminate.   2. Right ventricular systolic function is low normal. The right  ventricular size  is normal.   3. The mitral valve is grossly normal. Trivial mitral valve  regurgitation. No evidence of mitral stenosis.   4. The aortic valve has an indeterminant number of cusps. Aortic valve  regurgitation is not visualized. No aortic stenosis is present.   5. The inferior vena cava is normal in size with greater than 50%  respiratory variability, suggesting right atrial pressure of 3 mmHg.   Comparison(s): No significant change from prior study.   Conclusion(s)/Recommendation(s): Normal biventricular function without  evidence of hemodynamically significant valvular heart disease.   Echocardiogram 10/06/2021  IMPRESSIONS     1. Left ventricular ejection fraction, by estimation, is 55 to 60%. Left  ventricular ejection fraction by 3D volume is 58 %. The left ventricle has  normal function. The left ventricle has no regional wall motion  abnormalities. Left ventricular diastolic   parameters are consistent with Grade II diastolic dysfunction  (pseudonormalization).   2. Right ventricular systolic function is normal. The right ventricular  size is mildly enlarged.   3. Left atrial size was mildly dilated.   4. Right atrial size was mildly dilated.   5. The mitral valve is normal in structure. No evidence of mitral valve  regurgitation. No evidence of mitral stenosis.   6. The aortic valve is normal in structure. There is mild calcification  of the aortic valve. There is mild thickening of the aortic valve. Aortic  valve regurgitation is trivial. Aortic valve sclerosis is present, with no  evidence of aortic valve  stenosis.   7. The inferior vena cava is normal in size with greater than 50%  respiratory variability, suggesting right atrial pressure of 3 mmHg.   Cardiac catheterization 09/02/2020    Prox RCA lesion is 80% stenosed.   A drug-eluting stent was successfully placed using a SYNERGY XD 3.0X24.   Post intervention, there is a 0% residual stenosis.   Successful PCI  of the proximal RCA with IVUS guidance, orbital atherectomy, Shockwave lithotripsy and DES x 1   Plan: DAPT for one year. If stable anticipate DC in am.  Carotid Doppler 11/07/2021 Summary:  Right Carotid: Velocities in the right ICA are consistent with a 1-39%  stenosis.   Left Carotid: Velocities in the left ICA are consistent with a 1-39%  stenosis.   Vertebrals: Bilateral vertebral arteries demonstrate antegrade flow.  Subclavians: Normal flow hemodynamics were seen in bilateral subclavian               arteries.   Assessment & Plan   1.  Essential hypertension-BP today 1 130/80 Continue current medical therapy Heart healthy low-sodium diet-reviewed  Coronary artery  disease-denies chest pain.  Status post CABG x3 by Dr. Maren Beach in 2013.  With subsequent cardiac catheterization 09/02/2020 and underwent orbital arthrectomy by Dr. Swaziland.  A DES was placed in her RCA. Continue aspirin, amlodipine, Plavix, metoprolol, nitroglycerin   DOE, SOB, dizziness-does note some mild dizziness with standing.   Echocardiogram 10/06/2021 reassuring. Details above.  Carotid Doppler also reassuring. Maintain p.o. hydration-reviewed Allow pause before increasing physical activity changing positions Continue lower extremity support stockings  Hyperlipidemia-LDL 20 on 01/24/22 Continue aspirin, Repatha, atorvastatin Heart healthy low-sodium high-fiber diet Increase physical activity as tolerated Request lab work from PCP  Insomnia-reports not being able to sleep.  Has been ongoing for more than a year.  Sleep hygeiene discussed. Follow up with PCP  Disposition: Follow-up with Dr. Allyson Sabal or me in 6 months.   Thomasene Ripple. Jeydi Klingel NP-C     08/24/2022, 8:13 AM [facility] [address] Office [phone] Fax [phone]  Notice: This dictation was prepared with Dragon dictation along with smaller phrase technology. Any transcriptional errors that result  from this process are unintentional and may not be corrected upon review.  I spent 13 minutes examining this patient, reviewing medications, and using patient centered shared decision making involving her cardiac care.  Prior to her visit I spent greater than 20 minutes reviewing her past medical history,  medications, and prior cardiac tests.

## 2022-08-16 ENCOUNTER — Ambulatory Visit (HOSPITAL_COMMUNITY)
Admission: RE | Admit: 2022-08-16 | Discharge: 2022-08-16 | Disposition: A | Discharge status: Home / Self Care | Payer: PPO | Source: Ambulatory Visit | Attending: Urology | Admitting: Urology

## 2022-08-16 DIAGNOSIS — C641 Malignant neoplasm of right kidney, except renal pelvis: Secondary | ICD-10-CM

## 2022-08-16 DIAGNOSIS — Z905 Acquired absence of kidney: Secondary | ICD-10-CM | POA: Diagnosis not present

## 2022-08-16 DIAGNOSIS — Z85528 Personal history of other malignant neoplasm of kidney: Secondary | ICD-10-CM | POA: Diagnosis not present

## 2022-08-16 DIAGNOSIS — I700 Atherosclerosis of aorta: Secondary | ICD-10-CM | POA: Diagnosis not present

## 2022-08-16 MED ORDER — IOHEXOL 300 MG/ML  SOLN
100.0000 mL | Freq: Once | INTRAMUSCULAR | Status: AC | PRN
  Administered 2022-08-16: 100 mL via INTRAVENOUS

## 2022-08-16 MED ORDER — SODIUM CHLORIDE (PF) 0.9 % IJ SOLN
INTRAMUSCULAR | Status: AC
  Filled 2022-08-16: qty 50

## 2022-08-17 DIAGNOSIS — C641 Malignant neoplasm of right kidney, except renal pelvis: Secondary | ICD-10-CM | POA: Diagnosis not present

## 2022-08-17 DIAGNOSIS — D49511 Neoplasm of unspecified behavior of right kidney: Secondary | ICD-10-CM | POA: Diagnosis not present

## 2022-08-21 DIAGNOSIS — D225 Melanocytic nevi of trunk: Secondary | ICD-10-CM | POA: Diagnosis not present

## 2022-08-21 DIAGNOSIS — L814 Other melanin hyperpigmentation: Secondary | ICD-10-CM | POA: Diagnosis not present

## 2022-08-21 DIAGNOSIS — L570 Actinic keratosis: Secondary | ICD-10-CM | POA: Diagnosis not present

## 2022-08-21 DIAGNOSIS — L821 Other seborrheic keratosis: Secondary | ICD-10-CM | POA: Diagnosis not present

## 2022-08-21 DIAGNOSIS — Z85828 Personal history of other malignant neoplasm of skin: Secondary | ICD-10-CM | POA: Diagnosis not present

## 2022-08-21 DIAGNOSIS — Z08 Encounter for follow-up examination after completed treatment for malignant neoplasm: Secondary | ICD-10-CM | POA: Diagnosis not present

## 2022-08-21 DIAGNOSIS — Z86007 Personal history of in-situ neoplasm of skin: Secondary | ICD-10-CM | POA: Diagnosis not present

## 2022-08-22 DIAGNOSIS — M65342 Trigger finger, left ring finger: Secondary | ICD-10-CM | POA: Diagnosis not present

## 2022-08-22 DIAGNOSIS — M79642 Pain in left hand: Secondary | ICD-10-CM | POA: Diagnosis not present

## 2022-08-24 ENCOUNTER — Encounter: Payer: Self-pay | Admitting: General Practice

## 2022-08-24 ENCOUNTER — Ambulatory Visit: Payer: PPO | Attending: General Practice | Admitting: General Practice

## 2022-08-24 VITALS — BP 130/80 | HR 76 | Wt 181.0 lb

## 2022-08-24 DIAGNOSIS — Z951 Presence of aortocoronary bypass graft: Secondary | ICD-10-CM

## 2022-08-24 DIAGNOSIS — I2510 Atherosclerotic heart disease of native coronary artery without angina pectoris: Secondary | ICD-10-CM

## 2022-08-24 DIAGNOSIS — G4700 Insomnia, unspecified: Secondary | ICD-10-CM

## 2022-08-24 DIAGNOSIS — I10 Essential (primary) hypertension: Secondary | ICD-10-CM

## 2022-08-24 DIAGNOSIS — I25709 Atherosclerosis of coronary artery bypass graft(s), unspecified, with unspecified angina pectoris: Secondary | ICD-10-CM

## 2022-08-24 DIAGNOSIS — E785 Hyperlipidemia, unspecified: Secondary | ICD-10-CM | POA: Diagnosis not present

## 2022-08-24 DIAGNOSIS — R42 Dizziness and giddiness: Secondary | ICD-10-CM | POA: Diagnosis not present

## 2022-08-24 MED ORDER — ATORVASTATIN CALCIUM 20 MG PO TABS: 20.0000 mg | ORAL_TABLET | Freq: Every day | ORAL | 1 refills | Status: DC

## 2022-08-24 MED ORDER — METOPROLOL TARTRATE 25 MG PO TABS
25.0000 mg | ORAL_TABLET | Freq: Two times a day (BID) | ORAL | 1 refills | Status: DC
Start: 2022-08-24 — End: 2023-02-05

## 2022-08-24 MED ORDER — AMLODIPINE BESYLATE 5 MG PO TABS: 5.0000 mg | ORAL_TABLET | Freq: Every day | ORAL | 1 refills | Status: DC

## 2022-08-24 NOTE — Patient Instructions (Signed)
Medication Instructions:  The current medical regimen is effective;  continue present plan and medications as directed. Please refer to the Current Medication list given to you today. *If you need a refill on your cardiac medications before your next appointment, please call your pharmacy*  Lab Work: NONE If you have labs (blood work) drawn today and your tests are completely normal, you will receive your results only by: MyChart Message (if you have MyChart) OR A paper copy in the mail If you have any lab test that is abnormal or we need to change your treatment, we will call you to review the results.  Testing/Procedures: NONE  Follow-Up: At [facility], you and your health needs are our priority.  As part of our continuing mission to provide you with exceptional heart care, we have created designated Provider Care Teams.  These Care Teams include your primary Cardiologist (physician) and Advanced Practice Providers (APPs -  Physician Assistants and Nurse Practitioners) who all work together to provide you with the care you need, when you need it.  Your next appointment:   6 month(s)  Provider:   Nanetta Batty, MD     Other Instructions Quality Sleep Information, Adult Quality sleep is important for your mental and physical health. It also improves your quality of life. Quality sleep means you: Are asleep for most of the time you are in bed. Fall asleep within 30 minutes. Wake up no more than once a night. Are awake for no longer than 20 minutes if you do wake up during the night. Most adults need 7-8 hours of quality sleep each night. How can poor sleep affect me? If you do not get enough quality sleep, you may have: Mood swings. Daytime sleepiness. Decreased alertness, reaction time, and concentration. Sleep disorders, such as insomnia and sleep apnea. Difficulty with: Solving problems. Coping with stress. Paying attention. These issues may affect your  performance and productivity at work, school, and home. Lack of sleep may also put you at higher risk for accidents, suicide, and risky behaviors. If you do not get quality sleep, you may also be at higher risk for several health problems, including: Infections. Type 2 diabetes. Heart disease. High blood pressure. Obesity. Worsening of long-term conditions, like arthritis, kidney disease, depression, Parkinson's disease, and epilepsy. What actions can I take to get more quality sleep? Sleep schedule and routine Stick to a sleep schedule. Go to sleep and wake up at about the same time each day. Do not try to sleep less on weekdays and make up for lost sleep on weekends. This does not work. Limit naps during the day to 30 minutes or less. Do not take naps in the late afternoon. Make time to relax before bed. Reading, listening to music, or taking a hot bath promotes quality sleep. Make your bedroom a place that promotes quality sleep. Keep your bedroom dark, quiet, and at a comfortable room temperature. Make sure your bed is comfortable. Avoid using electronic devices that give off bright blue light for 30 minutes before bedtime. Your brain perceives bright blue light as sunlight. This includes television, phones, and computers. If you are lying awake in bed for longer than 20 minutes, get up and do a relaxing activity until you feel sleepy. Lifestyle     Try to get at least 30 minutes of exercise on most days. Do not exercise 2-3 hours before going to bed. Do not use any products that contain nicotine or tobacco. These products include cigarettes,  chewing tobacco, and vaping devices, such as e-cigarettes. If you need help quitting, ask your health care provider. Do not drink caffeinated beverages for at least 8 hours before going to bed. Coffee, tea, and some sodas contain caffeine. Do not drink alcohol or eat large meals close to bedtime. Try to get at least 30 minutes of sunlight every day.  Morning sunlight is best. Medical concerns Work with your health care provider to treat medical conditions that may affect sleeping, such as: Nasal obstruction. Snoring. Sleep apnea and other sleep disorders. Talk to your health care provider if you think any of your prescription medicines may cause you to have difficulty falling or staying asleep. If you have sleep problems, talk with a sleep consultant. If you think you have a sleep disorder, talk with your health care provider about getting evaluated by a specialist. Where to find more information Sleep Foundation: sleepfoundation.org American Academy of Sleep Medicine: aasm.org Centers for Disease Control and Prevention (CDC): TonerPromos.no Contact a health care provider if: You have trouble getting to sleep or staying asleep. You often wake up very early in the morning and cannot get back to sleep. You have daytime sleepiness. You have daytime sleep attacks of suddenly falling asleep and sudden muscle weakness (narcolepsy). You have a tingling sensation in your legs with a strong urge to move your legs (restless legs syndrome). You stop breathing briefly during sleep (sleep apnea). You think you have a sleep disorder or are taking a medicine that is affecting your quality of sleep. Summary Most adults need 7-8 hours of quality sleep each night. Getting enough quality sleep is important for your mental and physical health. Make your bedroom a place that promotes quality sleep, and avoid things that may cause you to have poor sleep, such as alcohol, caffeine, smoking, or large meals. Talk to your health care provider if you have trouble falling asleep or staying asleep. This information is not intended to replace advice given to you by your health care provider. Make sure you discuss any questions you have with your health care provider. Document Revised: 04/13/2021 Document Reviewed: 04/13/2021 Elsevier Patient Education  2024 [employer].

## 2022-08-31 DIAGNOSIS — M65342 Trigger finger, left ring finger: Secondary | ICD-10-CM | POA: Diagnosis not present

## 2022-09-07 DIAGNOSIS — M47812 Spondylosis without myelopathy or radiculopathy, cervical region: Secondary | ICD-10-CM | POA: Diagnosis not present

## 2022-09-07 DIAGNOSIS — M150 Primary generalized (osteo)arthritis: Secondary | ICD-10-CM | POA: Diagnosis not present

## 2022-09-07 DIAGNOSIS — G894 Chronic pain syndrome: Secondary | ICD-10-CM | POA: Diagnosis not present

## 2022-09-07 DIAGNOSIS — Z79891 Long term (current) use of opiate analgesic: Secondary | ICD-10-CM | POA: Diagnosis not present

## 2022-10-17 DIAGNOSIS — M4726 Other spondylosis with radiculopathy, lumbar region: Secondary | ICD-10-CM | POA: Diagnosis not present

## 2022-10-20 ENCOUNTER — Telehealth: Payer: Self-pay | Admitting: Cardiovascular Disease

## 2022-10-20 NOTE — Telephone Encounter (Signed)
  Per MyChart scheduling message:  Initial complaint: Shortness of breath   Pt c/o Shortness Of Breath: STAT if SOB developed within the last 24 hours or pt is noticeably SOB on the phone  1. Are you currently SOB (can you hear that pt is SOB on the phone)?   2. How long have you been experiencing SOB?   3. Are you SOB when sitting or when up moving around?   4. Are you currently experiencing any other symptoms?     Walking to the mail box and back. Some pain in my right shoulder blade.  I am only short of breath when I am walking very far....not when I am sitting.

## 2022-10-20 NOTE — Telephone Encounter (Signed)
Spoke with pt, she reports when you send in to make an appointment it asks a lot of questions. She does get SOB easily but that is her normal. She also reports an occ pain in the right shoulder and that has been going on for some time. She mainly wanted to get her appointment because she had gotten the letter in the mail. She did not need anything at this time.

## 2022-11-07 DIAGNOSIS — Z6831 Body mass index (BMI) 31.0-31.9, adult: Secondary | ICD-10-CM | POA: Diagnosis not present

## 2022-11-07 DIAGNOSIS — M4726 Other spondylosis with radiculopathy, lumbar region: Secondary | ICD-10-CM | POA: Diagnosis not present

## 2022-11-28 ENCOUNTER — Other Ambulatory Visit: Payer: Self-pay

## 2022-11-28 ENCOUNTER — Other Ambulatory Visit (HOSPITAL_BASED_OUTPATIENT_CLINIC_OR_DEPARTMENT_OTHER): Payer: Self-pay | Admitting: Cardiovascular Disease

## 2022-11-28 MED ORDER — NITROGLYCERIN 0.4 MG SL SUBL: 0.4000 mg | SUBLINGUAL_TABLET | SUBLINGUAL | 2 refills | Status: DC | PRN

## 2023-01-04 DIAGNOSIS — M79642 Pain in left hand: Secondary | ICD-10-CM | POA: Diagnosis not present

## 2023-01-08 ENCOUNTER — Ambulatory Visit (HOSPITAL_BASED_OUTPATIENT_CLINIC_OR_DEPARTMENT_OTHER): Payer: PPO | Admitting: Pulmonary Disease

## 2023-01-08 ENCOUNTER — Encounter (HOSPITAL_BASED_OUTPATIENT_CLINIC_OR_DEPARTMENT_OTHER): Payer: Self-pay | Admitting: Pulmonary Disease

## 2023-01-08 VITALS — BP 122/86 | HR 85 | Resp 16 | Ht 64.5 in | Wt 188.2 lb

## 2023-01-08 DIAGNOSIS — J4489 Other specified chronic obstructive pulmonary disease: Secondary | ICD-10-CM

## 2023-01-08 DIAGNOSIS — J385 Laryngeal spasm: Secondary | ICD-10-CM | POA: Diagnosis not present

## 2023-01-08 NOTE — Progress Notes (Signed)
 Subjective:    Patient ID: [name] Long, female    DOB: 1940-05-09, 83 y.o.   MRN: [id number]  HPI  83 yo  ex-smoker with COPD/asthma for FU of laryngospasm. She was seen by my partner Dr. Alaine in the past  Referred in December 2023 for dyspnea, has laryngospasm and a prior history of asthma.   She developed laryngospasm after PFTs in February.  Last office visit 02/2022 it was felt that dry powder of Advair  was contributing to bronchospasm and she was switched to Breztri  and referred to Dr. Garnette Silvan   [facility]  CABG 2013.   Quit smoking 1982, smoked 2-3 packs per day.  Discussed the use of AI scribe software for clinical note transcription with the patient, who gave verbal consent to proceed.  History of Present Illness   The patient, with a history of laryngospasm and asthma, presents for a six month follow up visit. She reports persistent symptoms of laryngospasm, characterized by a sensation of throat closure, coughing, and difficulty speaking. These episodes occur two to three times a week, lasting less than a minute, and are often triggered by eating certain foods, such as popcorn. The patient has not found any effective relief measures, but notes that standing up and drinking water  can sometimes help. She also reports that her voice has changed, sounding like a frog, particularly at night.  The patient's asthma is managed with Breztri , an inhaler medication. However, she does not believe the medication has made a noticeable difference in her symptoms. She has been on inhaler therapy for several years, but feels that her current symptoms are more related to her throat than her lungs.        Significant tests/ events reviewed   12/2018 FENO 20 ppb 2/ 2024 full PFT ratio 64%, FEV1 1.34 L 66% predicted, improved to 1.52 L 13% change with bronchodilator, review of data appears adequate, total lung capacity 4.71 L 90% predicted, DLCO 13.10 67% predicted -episode of laryngospasm  after PFTs  CT chest 08/2022 clear lungs  Review of Systems neg for any significant sore throat, dysphagia, itching, sneezing, nasal congestion or excess/ purulent secretions, fever, chills, sweats, unintended wt loss, pleuritic or exertional cp, hempoptysis, orthopnea pnd or change in chronic leg swelling. Also denies presyncope, palpitations, heartburn, abdominal pain, nausea, vomiting, diarrhea or change in bowel or urinary habits, dysuria,hematuria, rash, arthralgias, visual complaints, headache, numbness weakness or ataxia.     Objective:   Physical Exam  Gen. Pleasant, obese, in no distress ENT - no lesions, no post nasal drip Neck: No JVD, no thyromegaly, no carotid bruits Lungs: no use of accessory muscles, no dullness to percussion, decreased without rales or rhonchi  Cardiovascular: Rhythm regular, heart sounds  normal, no murmurs or gallops, no peripheral edema Musculoskeletal: No deformities, no cyanosis or clubbing , no tremors       Assessment & Plan:    Assessment and Plan    Laryngospasm Intermittent episodes of laryngospasm occurring two to three times a week, characterized by throat closing, coughing, and difficulty breathing. Episodes last less than a minute but are distressing. Symptoms include a squeaky cough and phlegm production. Eliminating caffeine  has not improved symptoms. Previous ENT consultation suggested working through the episodes. Discussed referral to Dr. Mannie for further evaluation and potential speech therapy. - Refer to Dr. Elspeth Silvan for further ENT evaluation and possible speech therapy.  Asthma Asthma managed with Breztri  inhaler. No significant improvement reported with the inhaler. Symptoms appear more  related to laryngospasm. Discussed a trial off Breztri  to assess symptom changes, with instructions to resume if breathing worsens or episodes increase. - Initiate a trial off Breztri  inhaler for four weeks to assess symptom  improvement. - Instruct to resume Breztri  if breathing worsens or episodes increase. - Follow up by end of January to evaluate trial results. Ideally neds another pFT but hesistant to do this given her h/o laryngospasm  Follow-up - Follow up in six months. - Report back by end of January regarding the trial off Breztri .

## 2023-01-08 NOTE — Patient Instructions (Signed)
 Trial of stopping Breztri x 4 weeks  - call back to report  X Refer to Dr Delford Field ENT in [place]

## 2023-01-16 DIAGNOSIS — M65312 Trigger thumb, left thumb: Secondary | ICD-10-CM | POA: Diagnosis not present

## 2023-01-30 DIAGNOSIS — G894 Chronic pain syndrome: Secondary | ICD-10-CM | POA: Diagnosis not present

## 2023-01-30 DIAGNOSIS — M47812 Spondylosis without myelopathy or radiculopathy, cervical region: Secondary | ICD-10-CM | POA: Diagnosis not present

## 2023-01-30 DIAGNOSIS — Z79891 Long term (current) use of opiate analgesic: Secondary | ICD-10-CM | POA: Diagnosis not present

## 2023-01-30 DIAGNOSIS — M150 Primary generalized (osteo)arthritis: Secondary | ICD-10-CM | POA: Diagnosis not present

## 2023-02-05 ENCOUNTER — Other Ambulatory Visit: Payer: Self-pay | Admitting: Family Medicine

## 2023-02-05 DIAGNOSIS — I10 Essential (primary) hypertension: Secondary | ICD-10-CM

## 2023-02-05 DIAGNOSIS — J4530 Mild persistent asthma, uncomplicated: Secondary | ICD-10-CM

## 2023-02-05 DIAGNOSIS — I25709 Atherosclerosis of coronary artery bypass graft(s), unspecified, with unspecified angina pectoris: Secondary | ICD-10-CM

## 2023-02-07 ENCOUNTER — Telehealth: Payer: Self-pay | Admitting: Pulmonary Disease

## 2023-02-07 NOTE — Telephone Encounter (Signed)
Patient is during fine without taking the [facility] She also is supposed to have a referral for Dr.Steven Delford Field but has not received one yet. Please call and advise [phone]

## 2023-02-20 ENCOUNTER — Ambulatory Visit: Payer: PPO | Admitting: Cardiovascular Disease

## 2023-02-21 ENCOUNTER — Ambulatory Visit: Payer: PPO | Admitting: Cardiovascular Disease

## 2023-02-27 ENCOUNTER — Encounter: Payer: Self-pay | Admitting: Internal Medicine

## 2023-03-05 DIAGNOSIS — M48061 Spinal stenosis, lumbar region without neurogenic claudication: Secondary | ICD-10-CM | POA: Diagnosis not present

## 2023-03-05 DIAGNOSIS — M4726 Other spondylosis with radiculopathy, lumbar region: Secondary | ICD-10-CM | POA: Diagnosis not present

## 2023-03-05 DIAGNOSIS — Z6832 Body mass index (BMI) 32.0-32.9, adult: Secondary | ICD-10-CM | POA: Diagnosis not present

## 2023-03-06 ENCOUNTER — Other Ambulatory Visit: Payer: Self-pay | Admitting: Neurosurgery

## 2023-03-08 ENCOUNTER — Other Ambulatory Visit: Payer: Self-pay | Admitting: Neurosurgery

## 2023-03-08 ENCOUNTER — Encounter: Payer: Self-pay | Admitting: Cardiovascular Disease

## 2023-03-08 ENCOUNTER — Ambulatory Visit: Payer: PPO | Attending: Cardiovascular Disease | Admitting: Cardiovascular Disease

## 2023-03-08 VITALS — BP 114/70 | HR 89 | Ht 64.5 in | Wt 189.0 lb

## 2023-03-08 DIAGNOSIS — I10 Essential (primary) hypertension: Secondary | ICD-10-CM

## 2023-03-08 DIAGNOSIS — E785 Hyperlipidemia, unspecified: Secondary | ICD-10-CM | POA: Diagnosis not present

## 2023-03-08 DIAGNOSIS — Z951 Presence of aortocoronary bypass graft: Secondary | ICD-10-CM | POA: Diagnosis not present

## 2023-03-08 NOTE — Patient Instructions (Signed)
 Medication Instructions:  Your physician recommends that you continue on your current medications as directed. Please refer to the Current Medication list given to you today.  *If you need a refill on your cardiac medications before your next appointment, please call your pharmacy*   Follow-Up: At [facility], you and your health needs are our priority.  As part of our continuing mission to provide you with exceptional heart care, we have created designated Provider Care Teams.  These Care Teams include your primary Cardiologist (physician) and Advanced Practice Providers (APPs -  Physician Assistants and Nurse Practitioners) who all work together to provide you with the care you need, when you need it.  We recommend signing up for the patient portal called "MyChart".  Sign up information is provided on this After Visit Summary.  MyChart is used to connect with patients for Virtual Visits (Telemedicine).  Patients are able to view lab/test results, encounter notes, upcoming appointments, etc.  Non-urgent messages can be sent to your provider as well.   To learn more about what you can do with MyChart, go to [email or web address].    Your next appointment:   6 month(s)  Provider:   Edd Fabian, FNP       Then, Nanetta Batty, MD will plan to see you again in 12 month(s).    Other Instructions   1st Floor: - Lobby - Registration  - Pharmacy  - Lab - Cafe  2nd Floor: - PV Lab - Diagnostic Testing (echo, CT, nuclear med)  3rd Floor: - Vacant  4th Floor: - TCTS (cardiothoracic surgery) - AFib Clinic - Structural Heart Clinic - Vascular Surgery  - Vascular Ultrasound  5th Floor: - HeartCare Cardiology (general and EP) - Clinical Pharmacy for coumadin, hypertension, lipid, weight-loss medications, and med management appointments    Valet parking services will be available as well.

## 2023-03-08 NOTE — Assessment & Plan Note (Addendum)
 History of hyperlipidemia on statin therapy, and Repatha..  Lipid profile performed 01/24/2022 revealing total cholesterol 119, LDL 20 and HDL of 88.

## 2023-03-08 NOTE — Assessment & Plan Note (Signed)
 History of CAD status post coronary artery bypass grafting for left main three-vessel disease back in January 2013.  She was cathed by Dr. Katrinka Blazing 08/31/2020 revealing a patent LIMA to the LAD and vein to an OM branch.  She had an occluded vein to the RCA with a high-grade calcified proximal RCA stenosis.  She underwent shockwave angioplasty and orbital atherectomy that was IVUS guided of her proximal RCA by Dr. [place] implanting a drug-eluting stent with excellent result.  She has had no recurrent symptoms.

## 2023-03-08 NOTE — Assessment & Plan Note (Signed)
 History of essential hypertension her blood pressure measured today at 114/70.  She is on amlodipine and metoprolol.

## 2023-03-08 NOTE — Progress Notes (Signed)
 03/08/2023 [name] Long   November 06, 1940  [id number]  Primary Physician Ronnald Nian, MD Primary Cardiologist: Runell Gess MD FACP, [place], [place], [place]  HPI:  [name] Long is a 83 y.o.    mildly overweight married Caucasian female, mother of 2, grandmother to 6 grandchildren, who I last saw in the office 02/21/2022.Marland Kitchen She ended up being admitted January 12, 2011, with chest pain and was catheterized, revealing a left main 3-vessel disease with an EF of 50% and high anterior hypokinesia, for which she underwent coronary artery bypass grafting by Dr. Kathlee Nations Trigt with a LIMA to her LAD, vein to an obtuse marginal branch and to the right coronary artery. Postop course was uncomplicated. A Myoview in April was normal. Her lipid profile was excellent. She saw Nada Boozer in our office on July 15 complaining of chest pain, and the concern was musculoskeletal versus pericarditis. A 2D echocardiogram was entirely normal. She ended up seeing Dr. Donata Clay, who diagnosed a staph sternal wound infection, requiring hospitalization and debridement. She had a wound VAC on and fortunately has completely healed. Since I saw her in the office a year and a half ago she was complaining of some increased dyspnea on exertion and decreased exercise tolerance.  A Myoview stress test was normal as was a 2D echocardiogram.  It turns out that her increasing shortness of breath is probably related to exacerbation of her asthma at that time.    She has been diagnosed with renal cell carcinoma and is scheduled to undergo laparoscopic partial nephrectomy by Dr. Sande Brothers at [facility] urology.   She underwent meniscus repair by Dr. Yisroel Ramming  of her right knee which did not result in an acceptable clinical outcome and therefore she underwent right total knee replacement. Her last Myoview stress test performed 12/28/2015 was low risk and nonischemic.   She apparently has a new lesion on her kidney which may require surgical  evaluation in the future.  She was complaining of some dyspnea which is new since I saw her a year ago but denies chest pain.  She was admitted on 08/31/2020 with chest pain and shortness of breath.  She underwent cardiac catheterization by Dr. Katrinka Blazing on that day revealing high-grade distal left main disease with an occluded LAD after the first diagonal branch, patent LIMA to the LAD, patent vein to obtuse marginal branch, occluded vein to the RCA and high-grade calcified proximal dominant RCA stenosis on a bend.  On 09/02/2020 she underwent complex IVUS guided orbital atherectomy with shockwave angioplasty and drug-eluting stenting of the RCA by Dr. Swaziland with an excellent result.  She was discharged home the following day.  She thinks that her shortness of breath had improved somewhat after that.   Since I saw her a year ago she is remained stable.  She denies chest pain or shortness of breath.  She does need her L4-L5 operated on this coming Friday.  Her last 2D echo performed 10/06/2021 revealed normal LV systolic function with grade 2 diastolic dysfunction and no valvular abnormalities.     Current Meds  Medication Sig   acetaminophen (TYLENOL) 500 MG tablet Take 500 mg by mouth every 6 (six) hours as needed for moderate pain.   albuterol (PROVENTIL HFA) 108 (90 Base) MCG/ACT inhaler INHALE 2 PUFF AS DIRECTED FOUR TIMES A DAY AS NEEDED   amLODipine (NORVASC) 5 MG tablet Take 1 tablet (5 mg total) by mouth daily.   aspirin EC 81 MG  tablet Take 81 mg by mouth daily. Swallow whole.   atorvastatin (LIPITOR) 20 MG tablet Take 1 tablet (20 mg total) by mouth daily.   Calcium Carb-Cholecalciferol (CALTRATE 600+D3 PO) Take 1 tablet by mouth daily.   carisoprodol (SOMA) 350 MG tablet Take 350 mg by mouth every 8 (eight) hours as needed (for arthritis pain).   Cholecalciferol (VITAMIN D) 50 MCG (2000 UT) tablet Take 2,000 Units by mouth daily.   esomeprazole (NEXIUM) 20 MG capsule Take 20 mg by mouth in the  morning and at bedtime.   Evolocumab (REPATHA SURECLICK) 140 MG/ML SOAJ INJECT 140 MG INTO THE SKIN EVERY 14 DAYS   fluticasone (FLONASE) 50 MCG/ACT nasal spray Place 1 spray into both nostrils daily.   gabapentin (NEURONTIN) 600 MG tablet Take 1,200-1,800 mg by mouth See admin instructions. Take 2 tablets (1200 mg) by mouth in the morning & take 3 tablets (1800 mg) by mouth at night   HYDROmorphone (DILAUDID) 4 MG tablet Take 1 tablet (4 mg total) by mouth every 4 (four) hours as needed for severe pain.   magnesium oxide (MAG-OX) 400 MG tablet Take 400 mg by mouth at bedtime.   Melatonin 10 MG CAPS Take 10 mg by mouth at bedtime as needed (sleep).   metoprolol tartrate (LOPRESSOR) 25 MG tablet TAKE 1 TABLET BY MOUTH TWICE A DAY   montelukast (SINGULAIR) 10 MG tablet TAKE 1 TABLET BY MOUTH DAILY   Multiple Vitamins-Minerals (PRESERVISION AREDS 2 PO) Take 1 tablet by mouth in the morning and at bedtime.   nitroGLYCERIN (NITROSTAT) 0.4 MG SL tablet Place 1 tablet (0.4 mg total) under the tongue every 5 (five) minutes x 3 doses as needed for chest pain.   Polyethyl Glycol-Propyl Glycol (SYSTANE OP) Place 1 drop into both eyes 2 (two) times daily.   silodosin (RAPAFLO) 8 MG CAPS capsule Take 1 capsule (8 mg total) by mouth daily.     Allergies  Allergen Reactions   Dimenhydrinate Other (See Comments)    "locks up kidneys"   Morphine And Codeine Nausea And Vomiting   Percocet [Oxycodone-Acetaminophen] Other (See Comments)    hallucinations   Zolpidem Other (See Comments)    Unusual behavior    Social History   Socioeconomic History   Marital status: Married    Spouse name: Not on file   Number of children: Not on file   Years of education: Not on file   Highest education level: Some college, no degree  Occupational History   Not on file  Tobacco Use   Smoking status: Former    Current packs/day: 0.00    Types: Cigarettes    Quit date: 01/12/1980    Years since quitting: 43.1    Smokeless tobacco: Never  Vaping Use   Vaping status: Never Used  Substance and Sexual Activity   Alcohol use: No    Alcohol/week: 0.0 standard drinks of alcohol   Drug use: No   Sexual activity: Yes  Other Topics Concern   Not on file  Social History Narrative   Right handed   Social Drivers of Health   Financial Resource Strain: Low Risk  (01/26/2023)   Overall Financial Resource Strain (CARDIA)    Difficulty of Paying Living Expenses: Not hard at all  Food Insecurity: No Food Insecurity (01/26/2023)   Hunger Vital Sign    Worried About Running Out of Food in the Last Year: Never true    Ran Out of Food in the Last Year: Never true  Transportation Needs: No Transportation Needs (01/26/2023)   PRAPARE - Administrator, Civil Service (Medical): No    Lack of Transportation (Non-Medical): No  Physical Activity: Insufficiently Active (01/26/2023)   Exercise Vital Sign    Days of Exercise per Week: 3 days    Minutes of Exercise per Session: 30 min  Stress: No Stress Concern Present (01/26/2023)   [employer] of Occupational Health - Occupational Stress Questionnaire    Feeling of Stress : Not at all  Social Connections: Socially Integrated (01/26/2023)   Social Connection and Isolation Panel [NHANES]    Frequency of Communication with Friends and Family: More than three times a week    Frequency of Social Gatherings with Friends and Family: Twice a week    Attends Religious Services: 1 to 4 times per year    Active Member of [employer] or Organizations: Yes    Attends Engineer, structural: More than 4 times per year    Marital Status: Married  Catering manager Violence: Not on file     Review of Systems: General: negative for chills, fever, night sweats or weight changes.  Cardiovascular: negative for chest pain, dyspnea on exertion, edema, orthopnea, palpitations, paroxysmal nocturnal dyspnea or shortness of breath Dermatological: negative for  rash Respiratory: negative for cough or wheezing Urologic: negative for hematuria Abdominal: negative for nausea, vomiting, diarrhea, bright red blood per rectum, melena, or hematemesis Neurologic: negative for visual changes, syncope, or dizziness All other systems reviewed and are otherwise negative except as noted above.    Blood pressure 114/70, pulse 89, height 5' 4.5" (1.638 m), weight 189 lb (85.7 kg), SpO2 92%.  General appearance: alert and no distress Neck: no adenopathy, no carotid bruit, no JVD, supple, symmetrical, trachea midline, and thyroid not enlarged, symmetric, no tenderness/mass/nodules Lungs: clear to auscultation bilaterally Heart: regular rate and rhythm, S1, S2 normal, no murmur, click, rub or gallop Extremities: extremities normal, atraumatic, no cyanosis or edema Pulses: 2+ and symmetric Skin: Skin color, texture, turgor normal. No rashes or lesions Neurologic: Grossly normal  EKG EKG Interpretation Date/Time:  Thursday March 08 2023 15:44:39 EST Ventricular Rate:  89 PR Interval:  150 QRS Duration:  84 QT Interval:  372 QTC Calculation: 452 R Axis:   45  Text Interpretation: Normal sinus rhythm T wave abnormality, consider inferior ischemia T wave abnormality, consider anterolateral ischemia When compared with ECG of 24-Aug-2022 07:56, Inverted T waves have replaced nonspecific T wave abnormality in Inferior leads Confirmed by Nanetta Batty [phone]) on 03/08/2023 3:52:15 PM    ASSESSMENT AND PLAN:   S/P CABG x 3: (lima-lad,svg-om,svg-rca) History of CAD status post coronary artery bypass grafting for left main three-vessel disease back in January 2013.  She was cathed by Dr. Katrinka Blazing 08/31/2020 revealing a patent LIMA to the LAD and vein to an OM branch.  She had an occluded vein to the RCA with a high-grade calcified proximal RCA stenosis.  She underwent shockwave angioplasty and orbital atherectomy that was IVUS guided of her proximal RCA by Dr. Swaziland  implanting a drug-eluting stent with excellent result.  She has had no recurrent symptoms.  Hyperlipidemia with target LDL less than 70 History of hyperlipidemia on statin therapy, and Repatha..  Lipid profile performed 01/24/2022 revealing total cholesterol 119, LDL 20 and HDL of 88.  Essential hypertension History of essential hypertension her blood pressure measured today at 114/70.  She is on amlodipine and metoprolol.     Runell Gess MD FACP,FACC,FAHA, [facility]  03/08/2023 3:59 PM

## 2023-03-09 NOTE — Pre-Procedure Instructions (Signed)
 Surgical Instructions   Your procedure is scheduled on March 16, 2023. Report to [facility] Main Entrance "A" at 6:30 A.M., then check in with the Admitting office. Any questions or running late day of surgery: call [phone]  Questions prior to your surgery date: call [phone], Monday-Friday, 8am-4pm. If you experience any cold or flu symptoms such as cough, fever, chills, shortness of breath, etc. between now and your scheduled surgery, please notify us at the above number.     Remember:  Do not eat or drink after midnight the night before your surgery    Take these medicines the morning of surgery with A SIP OF WATER: amLODipine (NORVASC)  atorvastatin (LIPITOR)  esomeprazole (NEXIUM)  fluticasone (FLONASE) nasal spray  gabapentin (NEURONTIN)  metoprolol tartrate (LOPRESSOR)  montelukast (SINGULAIR)  Polyethyl Glycol-Propyl Glycol (SYSTANE OP) eye drops silodosin (RAPAFLO)    May take these medicines IF NEEDED: acetaminophen (TYLENOL)  albuterol (PROVENTIL HFA) inhaler - please bring inhaler with you morning of surgery carisoprodol (SOMA)  HYDROmorphone (DILAUDID)  nitroGLYCERIN (NITROSTAT) - if dose taken prior to surgery, please call either of the above phone numbers   Follow your surgeon's instructions on when to stop Aspirin.  If no instructions were given by your surgeon then you will need to call the office to get those instructions.     One week prior to surgery, STOP taking any Aleve, Naproxen, Ibuprofen, Motrin, Advil, Goody's, BC's, all herbal medications, fish oil, and non-prescription vitamins.                     Do NOT Smoke (Tobacco/Vaping) for 24 hours prior to your procedure.  If you use a CPAP at night, you may bring your mask/headgear for your overnight stay.   You will be asked to remove any contacts, glasses, piercing's, hearing aid's, dentures/partials prior to surgery. Please bring cases for these items if needed.    Patients discharged  the day of surgery will not be allowed to drive home, and someone needs to stay with them for 24 hours.  SURGICAL WAITING ROOM VISITATION Patients may have no more than 2 support people in the waiting area - these visitors may rotate.   Pre-op nurse will coordinate an appropriate time for 1 ADULT support person, who may not rotate, to accompany patient in pre-op.  Children under the age of 44 must have an adult with them who is not the patient and must remain in the main waiting area with an adult.  If the patient needs to stay at the hospital during part of their recovery, the visitor guidelines for inpatient rooms apply.  Please refer to the [facility] website for the visitor guidelines for any additional information.   If you received a COVID test during your pre-op visit  it is requested that you wear a mask when out in public, stay away from anyone that may not be feeling well and notify your surgeon if you develop symptoms. If you have been in contact with anyone that has tested positive in the last 10 days please notify you surgeon.      Pre-operative 5 CHG Bathing Instructions   You can play a key role in reducing the risk of infection after surgery. Your skin needs to be as free of germs as possible. You can reduce the number of germs on your skin by washing with CHG (chlorhexidine gluconate) soap before surgery. CHG is an antiseptic soap that kills germs and continues to kill germs  even after washing.   DO NOT use if you have an allergy to chlorhexidine/CHG or antibacterial soaps. If your skin becomes reddened or irritated, stop using the CHG and notify one of our RNs at [phone].   Please shower with the CHG soap starting 4 days before surgery using the following schedule:     Please keep in mind the following:  DO NOT shave, including legs and underarms, starting the day of your first shower.   You may shave your face at any point before/day of surgery.  Place clean  sheets on your bed the day you start using CHG soap. Use a clean washcloth (not used since being washed) for each shower. DO NOT sleep with pets once you start using the CHG.   CHG Shower Instructions:  Wash your face and private area with normal soap. If you choose to wash your hair, wash first with your normal shampoo.  After you use shampoo/soap, rinse your hair and body thoroughly to remove shampoo/soap residue.  Turn the water OFF and apply about 3 tablespoons (45 ml) of CHG soap to a CLEAN washcloth.  Apply CHG soap ONLY FROM YOUR NECK DOWN TO YOUR TOES (washing for 3-5 minutes)  DO NOT use CHG soap on face, private areas, open wounds, or sores.  Pay special attention to the area where your surgery is being performed.  If you are having back surgery, having someone wash your back for you may be helpful. Wait 2 minutes after CHG soap is applied, then you may rinse off the CHG soap.  Pat dry with a clean towel  Put on clean clothes/pajamas   If you choose to wear lotion, please use ONLY the CHG-compatible lotions that are listed below.  Additional instructions for the day of surgery: DO NOT APPLY any lotions, deodorants, cologne, or perfumes.   Do not bring valuables to the hospital. [facility] is not responsible for any belongings/valuables. Do not wear nail polish, gel polish, artificial nails, or any other type of covering on natural nails (fingers and toes) Do not wear jewelry or makeup Put on clean/comfortable clothes.  Please brush your teeth.  Ask your nurse before applying any prescription medications to the skin.     CHG Compatible Lotions   Aveeno Moisturizing lotion  Cetaphil Moisturizing Cream  Cetaphil Moisturizing Lotion  Clairol Herbal Essence Moisturizing Lotion, Dry Skin  Clairol Herbal Essence Moisturizing Lotion, Extra Dry Skin  Clairol Herbal Essence Moisturizing Lotion, Normal Skin  Curel Age Defying Therapeutic Moisturizing Lotion with Alpha Hydroxy   Curel Extreme Care Body Lotion  Curel Soothing Hands Moisturizing Hand Lotion  Curel Therapeutic Moisturizing Cream, Fragrance-Free  Curel Therapeutic Moisturizing Lotion, Fragrance-Free  Curel Therapeutic Moisturizing Lotion, Original Formula  Eucerin Daily Replenishing Lotion  Eucerin Dry Skin Therapy Plus Alpha Hydroxy Crme  Eucerin Dry Skin Therapy Plus Alpha Hydroxy Lotion  Eucerin Original Crme  Eucerin Original Lotion  Eucerin Plus Crme Eucerin Plus Lotion  Eucerin TriLipid Replenishing Lotion  Keri Anti-Bacterial Hand Lotion  Keri Deep Conditioning Original Lotion Dry Skin Formula Softly Scented  Keri Deep Conditioning Original Lotion, Fragrance Free Sensitive Skin Formula  Keri Lotion Fast Absorbing Fragrance Free Sensitive Skin Formula  Keri Lotion Fast Absorbing Softly Scented Dry Skin Formula  Keri Original Lotion  Keri Skin Renewal Lotion Keri Silky Smooth Lotion  Keri Silky Smooth Sensitive Skin Lotion  Nivea Body Creamy Conditioning Oil  Nivea Body Extra Enriched Psychologist, educational  Lotion Nivea Crme  Nivea Skin Firming Lotion  NutraDerm 30 Skin Lotion  NutraDerm Skin Lotion  NutraDerm Therapeutic Skin Cream  NutraDerm Therapeutic Skin Lotion  ProShield Protective Hand Cream  Provon moisturizing lotion  Please read over the following fact sheets that you were given.

## 2023-03-12 ENCOUNTER — Encounter (HOSPITAL_COMMUNITY)
Admission: RE | Admit: 2023-03-12 | Discharge: 2023-03-12 | Disposition: A | Discharge status: Home / Self Care | Source: Ambulatory Visit | Attending: Neurosurgery | Admitting: Neurosurgery

## 2023-03-12 ENCOUNTER — Encounter (HOSPITAL_COMMUNITY): Payer: Self-pay

## 2023-03-12 ENCOUNTER — Other Ambulatory Visit: Payer: Self-pay

## 2023-03-12 VITALS — BP 122/80 | HR 93 | Temp 98.4°F | Resp 17 | Ht 64.0 in | Wt 191.3 lb

## 2023-03-12 DIAGNOSIS — I10 Essential (primary) hypertension: Secondary | ICD-10-CM | POA: Insufficient documentation

## 2023-03-12 DIAGNOSIS — J4489 Other specified chronic obstructive pulmonary disease: Secondary | ICD-10-CM | POA: Diagnosis not present

## 2023-03-12 DIAGNOSIS — Z01818 Encounter for other preprocedural examination: Secondary | ICD-10-CM

## 2023-03-12 DIAGNOSIS — Z01812 Encounter for preprocedural laboratory examination: Secondary | ICD-10-CM | POA: Diagnosis not present

## 2023-03-12 DIAGNOSIS — Z87891 Personal history of nicotine dependence: Secondary | ICD-10-CM | POA: Insufficient documentation

## 2023-03-12 DIAGNOSIS — I2510 Atherosclerotic heart disease of native coronary artery without angina pectoris: Secondary | ICD-10-CM | POA: Diagnosis not present

## 2023-03-12 DIAGNOSIS — Z951 Presence of aortocoronary bypass graft: Secondary | ICD-10-CM | POA: Diagnosis not present

## 2023-03-12 HISTORY — DX: Chronic kidney disease, unspecified: N18.9

## 2023-03-12 LAB — BASIC METABOLIC PANEL
Anion gap: 7 (ref 5–15)
BUN: 19 mg/dL (ref 8–23)
CO2: 29 mmol/L (ref 22–32)
Calcium: 8.8 mg/dL — ABNORMAL LOW (ref 8.9–10.3)
Chloride: 106 mmol/L (ref 98–111)
Creatinine, Ser: 1.19 mg/dL — ABNORMAL HIGH (ref 0.44–1.00)
GFR, Estimated: 46 mL/min — ABNORMAL LOW (ref >60)
Glucose, Bld: 138 mg/dL — ABNORMAL HIGH (ref 70–99)
Potassium: 4.2 mmol/L (ref 3.5–5.1)
Sodium: 142 mmol/L (ref 135–145)

## 2023-03-12 LAB — CBC
HCT: 40.9 % (ref 36.0–46.0)
Hemoglobin: 12.8 g/dL (ref 12.0–15.0)
MCH: 28.8 pg (ref 26.0–34.0)
MCHC: 31.3 g/dL (ref 30.0–36.0)
MCV: 92.1 fL (ref 80.0–100.0)
Platelets: 185 10*3/uL (ref 150–400)
RBC: 4.44 MIL/uL (ref 3.87–5.11)
RDW: 14.5 % (ref 11.5–15.5)
WBC: 5.9 10*3/uL (ref 4.0–10.5)
nRBC: 0 % (ref 0.0–0.2)

## 2023-03-12 LAB — SURGICAL PCR SCREEN
MRSA, PCR: NEGATIVE
Staphylococcus aureus: NEGATIVE

## 2023-03-12 NOTE — Progress Notes (Signed)
 PCP - Dr. Sharlot Gowda Cardiologist - Dr. Nanetta Batty - Last office visit 03/08/2023 Pulmonologist - Dr. Cyril Mourning ENT - Pt is pending referral to Dr. Deland Pretty in [facility]  PPM/ICD - Denies Device Orders - n/a Rep Notified - n/a  Chest x-ray - n/a EKG - 03/08/2023 Stress Test - 10/28/2015 ECHO - 10/06/2021 Cardiac Cath - 09/02/2020  Sleep Study - Denies CPAP - n/a  No DM  Last dose of GLP1 agonist- n/a GLP1 instructions: n/a  Blood Thinner Instructions: n/a Aspirin Instructions: Pt has already stopped ASA. Last dose was March 8th.  NPO after midnight   COVID TEST- n/a   Anesthesia review: Yes. Cardiac hx (CAD with stent and CABG x3, HTN, MI and angina. Pt notes using one dose of nitroglycerin one month ago with resolution of CP). Pt also notes she experiences laryngeal spasms for which Dr. Vassie Loll has sent referral to Dr. Deland Pretty. On average, they happen 1x/month but occasionally 1-2 additional times per month. They last for less than one minute and pt waits for it to resolve and nothing precipitates an event from happening. Discussed with Antionette Poles, PA-C.   Patient denies shortness of breath, fever, cough and chest pain at PAT appointment. Pt denies any respiratory illness/infection in the last two months.   All instructions explained to the patient, with a verbal understanding of the material. Patient agrees to go over the instructions while at home for a better understanding. Patient also instructed to self quarantine after being tested for COVID-19. The opportunity to ask questions was provided.

## 2023-03-13 NOTE — Anesthesia Preprocedure Evaluation (Signed)
 Anesthesia Evaluation  Patient identified by MRN, date of birth, ID band Patient awake    Reviewed: Allergy & Precautions, NPO status , Patient's Chart, lab work & pertinent test results  History of Anesthesia Complications (+) PONV and history of anesthetic complications  Airway Mallampati: IV  TM Distance: >3 FB Neck ROM: Limited    Dental no notable dental hx.    Pulmonary asthma , COPD, former smoker   Pulmonary exam normal breath sounds clear to auscultation       Cardiovascular hypertension, Pt. on medications and Pt. on home beta blockers + CAD, + Past MI and + CABG  Normal cardiovascular exam Rhythm:Regular Rate:Normal     Neuro/Psych  Headaches  negative psych ROS   GI/Hepatic ,GERD  Medicated and Controlled,,(+)     substance abuse    Endo/Other  negative endocrine ROS    Renal/GU Renal disease     Musculoskeletal  (+) Arthritis ,  Fibromyalgia -, narcotic dependent  Abdominal  (+) + obese  Peds  Hematology negative hematology ROS (+)   Anesthesia Other Findings Stenosis of lateral recess of lumbar spine   Reproductive/Obstetrics                             Anesthesia Physical Anesthesia Plan  ASA: 3  Anesthesia Plan: General   Post-op Pain Management: Ketamine IV* and Dilaudid IV   Induction: Intravenous  PONV Risk Score and Plan: 4 or greater and Ondansetron, Dexamethasone, Propofol infusion, Treatment may vary due to age or medical condition and Amisulpride  Airway Management Planned: Oral ETT and Video Laryngoscope Planned  Additional Equipment:   Intra-op Plan:   Post-operative Plan: Extubation in OR  Informed Consent: I have reviewed the patients History and Physical, chart, labs and discussed the procedure including the risks, benefits and alternatives for the proposed anesthesia with the patient or authorized representative who has indicated his/her  understanding and acceptance.     Dental advisory given  Plan Discussed with: CRNA  Anesthesia Plan Comments: (PAT note by Antionette Poles, PA-C: 83 year old female follows with cardiology for history of CAD s/p CABG x 3 in 2013 and subsequent PCI of the proximal RCA with orbital atherectomy and shockwave lithotripsy and DES x 1 in 08/2020, HLD, HTN.  Echo 10/2021 showed normal LV systolic function, grade 2 DD, no significant valve abnormalities.  Last seen by Dr. Allyson Sabal on 03/08/2023.  Upcoming surgery discussed.  Per note, "Since I saw her a year ago she is remained stable.  She denies chest pain or shortness of breath.  She does need her L4-L5 operated on this coming Friday.  Her last 2D echo performed 10/06/2021 revealed normal LV systolic function with grade 2 diastolic dysfunction and no valvular abnormalities."  Follows with pulmonologist Dr. Vassie Loll for history of former smoker with COPD/asthma and laryngospasm.  Patient has reportedly suffered from under spasms for the past several years.  She states that 1-2 times per month she will have episodes of spasm characterized by sensation of throat closure, coughing, and difficulty speaking.  These generally last 30 seconds to a minute and resolve spontaneously.  She did not have any benefit with Breztri inhaler.  Previous ENT consultation suggested working through the episodes.  When last seen by Dr. Vassie Loll on 01/08/2023, she was referred for new evaluation by otolaryngologist Dr. Harriette Ohara.  This referral is still pending.  Difficult intubation documented 07/14/2022 when patient had C3-4 ACDF.  Per  anesthesia airway note, "Difficulty was anticipated and Difficult Airway- due to anterior larynx."  GlideScope was used electively.  Preop labs reviewed, unremarkable.  EKG 03/08/2023: Normal sinus rhythm 89.  Inferior and anterolateral T wave inversions.  PFTs 02/07/2022: FVC-%Pred-Pre % 77 FEV1-%Pred-Pre % 66 FEV1FVC-%Pred-Pre % 86 TLC % pred % 90 RV %  pred % 106 DLCO unc % pred % 67  TTE 10/06/2021:   Prox RCA lesion is 80% stenosed.   A drug-eluting stent was successfully placed using a SYNERGY XD 3.0X24.   Post intervention, there is a 0% residual stenosis.  1. Successful PCI of the proximal RCA with IVUS guidance, orbital atherectomy, Shockwave lithotripsy and DES x 1  Plan: DAPT for one year. If stable anticipate DC in am.   )        Anesthesia Quick Evaluation

## 2023-03-13 NOTE — Progress Notes (Signed)
 Anesthesia Chart Review:  83 year old female follows with cardiology for history of CAD s/p CABG x 3 in 2013 and subsequent PCI of the proximal RCA with orbital atherectomy and shockwave lithotripsy and DES x 1 in 08/2020, HLD, HTN.  Echo 10/2021 showed normal LV systolic function, grade 2 DD, no significant valve abnormalities.  Last seen by Dr. Allyson Sabal on 03/08/2023.  Upcoming surgery discussed.  Per note, "Since I saw her a year ago she is remained stable.  She denies chest pain or shortness of breath.  She does need her L4-L5 operated on this coming Friday.  Her last 2D echo performed 10/06/2021 revealed normal LV systolic function with grade 2 diastolic dysfunction and no valvular abnormalities."  Follows with pulmonologist Dr. Vassie Loll for history of former smoker with COPD/asthma and laryngospasm.  Patient has reportedly suffered from under spasms for the past several years.  She states that 1-2 times per month she will have episodes of spasm characterized by sensation of throat closure, coughing, and difficulty speaking.  These generally last 30 seconds to a minute and resolve spontaneously.  She did not have any benefit with Breztri inhaler.  Previous ENT consultation suggested working through the episodes.  When last seen by Dr. Vassie Loll on 01/08/2023, she was referred for new evaluation by otolaryngologist Dr. Harriette Ohara.  This referral is still pending.  Difficult intubation documented 07/14/2022 when patient had C3-4 ACDF.  Per anesthesia airway note, "Difficulty was anticipated and Difficult Airway- due to anterior larynx."  GlideScope was used electively.  Preop labs reviewed, unremarkable.  EKG 03/08/2023: Normal sinus rhythm 89.  Inferior and anterolateral T wave inversions.  PFTs 02/07/2022: FVC-%Pred-Pre % 77  FEV1-%Pred-Pre % 66  FEV1FVC-%Pred-Pre % 86  TLC % pred % 90  RV % pred % 106  DLCO unc % pred % 67   TTE 10/06/2021:   Prox RCA lesion is 80% stenosed.   A drug-eluting stent was  successfully placed using a SYNERGY XD 3.0X24.   Post intervention, there is a 0% residual stenosis.   Successful PCI of the proximal RCA with IVUS guidance, orbital atherectomy, Shockwave lithotripsy and DES x 1   Plan: DAPT for one year. If stable anticipate DC in am.    Zannie Cove [facility] Short Stay Center/Anesthesiology Phone [phone] 03/13/2023 11:56 AM

## 2023-03-16 ENCOUNTER — Ambulatory Visit (HOSPITAL_COMMUNITY)
Admission: RE | Admit: 2023-03-16 | Discharge: 2023-03-16 | Disposition: A | Discharge status: Home / Self Care | Source: Ambulatory Visit | Attending: Neurosurgery | Admitting: Neurosurgery

## 2023-03-16 ENCOUNTER — Ambulatory Visit (HOSPITAL_BASED_OUTPATIENT_CLINIC_OR_DEPARTMENT_OTHER): Admitting: Certified Registered"

## 2023-03-16 ENCOUNTER — Ambulatory Visit (HOSPITAL_COMMUNITY)

## 2023-03-16 ENCOUNTER — Ambulatory Visit (HOSPITAL_COMMUNITY): Payer: Self-pay | Admitting: Physician Assistant

## 2023-03-16 ENCOUNTER — Other Ambulatory Visit: Payer: Self-pay

## 2023-03-16 ENCOUNTER — Encounter (HOSPITAL_COMMUNITY): Payer: Self-pay | Admitting: Neurosurgery

## 2023-03-16 ENCOUNTER — Encounter (HOSPITAL_COMMUNITY)
Admission: RE | Disposition: A | Discharge status: Home / Self Care | Payer: Self-pay | Source: Ambulatory Visit | Attending: Neurosurgery

## 2023-03-16 DIAGNOSIS — M48061 Spinal stenosis, lumbar region without neurogenic claudication: Secondary | ICD-10-CM | POA: Diagnosis not present

## 2023-03-16 DIAGNOSIS — Z0189 Encounter for other specified special examinations: Secondary | ICD-10-CM | POA: Diagnosis not present

## 2023-03-16 DIAGNOSIS — I10 Essential (primary) hypertension: Secondary | ICD-10-CM

## 2023-03-16 DIAGNOSIS — M797 Fibromyalgia: Secondary | ICD-10-CM | POA: Insufficient documentation

## 2023-03-16 DIAGNOSIS — K219 Gastro-esophageal reflux disease without esophagitis: Secondary | ICD-10-CM | POA: Diagnosis not present

## 2023-03-16 DIAGNOSIS — I129 Hypertensive chronic kidney disease with stage 1 through stage 4 chronic kidney disease, or unspecified chronic kidney disease: Secondary | ICD-10-CM | POA: Insufficient documentation

## 2023-03-16 DIAGNOSIS — Z951 Presence of aortocoronary bypass graft: Secondary | ICD-10-CM | POA: Diagnosis not present

## 2023-03-16 DIAGNOSIS — I2510 Atherosclerotic heart disease of native coronary artery without angina pectoris: Secondary | ICD-10-CM

## 2023-03-16 DIAGNOSIS — Z87891 Personal history of nicotine dependence: Secondary | ICD-10-CM | POA: Insufficient documentation

## 2023-03-16 DIAGNOSIS — I252 Old myocardial infarction: Secondary | ICD-10-CM | POA: Diagnosis not present

## 2023-03-16 DIAGNOSIS — J4489 Other specified chronic obstructive pulmonary disease: Secondary | ICD-10-CM | POA: Insufficient documentation

## 2023-03-16 DIAGNOSIS — N189 Chronic kidney disease, unspecified: Secondary | ICD-10-CM | POA: Diagnosis not present

## 2023-03-16 DIAGNOSIS — J449 Chronic obstructive pulmonary disease, unspecified: Secondary | ICD-10-CM | POA: Diagnosis not present

## 2023-03-16 DIAGNOSIS — Z79891 Long term (current) use of opiate analgesic: Secondary | ICD-10-CM | POA: Insufficient documentation

## 2023-03-16 HISTORY — PX: FORAMINOTOMY 1 LEVEL: SHX5835

## 2023-03-16 LAB — POCT I-STAT EG7
Acid-Base Excess: 15 mmol/L — ABNORMAL HIGH (ref 0.0–2.0)
Bicarbonate: 39.3 mmol/L — ABNORMAL HIGH (ref 20.0–28.0)
Calcium, Ion: 1.19 mmol/L (ref 1.15–1.40)
HCT: 22 % — ABNORMAL LOW (ref 36.0–46.0)
Hemoglobin: 7.5 g/dL — ABNORMAL LOW (ref 12.0–15.0)
O2 Saturation: 54 %
Patient temperature: 36.9
Potassium: 4.3 mmol/L (ref 3.5–5.1)
Sodium: 139 mmol/L (ref 135–145)
TCO2: 41 mmol/L — ABNORMAL HIGH (ref 22–32)
pCO2, Ven: 50 mmHg (ref 44–60)
pH, Ven: 7.502 — ABNORMAL HIGH (ref 7.25–7.43)
pO2, Ven: 26 mmHg — CL (ref 32–45)

## 2023-03-16 SURGERY — FORAMINOTOMY 1 LEVEL
Anesthesia: General | Site: Back | Laterality: Left

## 2023-03-16 MED ORDER — ONDANSETRON HCL 4 MG/2ML IJ SOLN: 4.0000 mg | Freq: Once | INTRAMUSCULAR | Status: DC | PRN

## 2023-03-16 MED ORDER — CEFAZOLIN SODIUM-DEXTROSE 2-4 GM/100ML-% IV SOLN
2.0000 g | INTRAVENOUS | Status: AC
Start: 2023-03-16 — End: 2023-03-16
  Administered 2023-03-16: 2 g via INTRAVENOUS

## 2023-03-16 MED ORDER — HYDROMORPHONE HCL 2 MG PO TABS
ORAL_TABLET | ORAL | Status: AC
  Filled 2023-03-16: qty 2

## 2023-03-16 MED ORDER — ACETAMINOPHEN 325 MG PO TABS: 650.0000 mg | ORAL_TABLET | ORAL | Status: DC | PRN

## 2023-03-16 MED ORDER — HYDROMORPHONE HCL 1 MG/ML IJ SOLN
INTRAMUSCULAR | Status: AC
  Administered 2023-03-16: 0.5 mg via INTRAVENOUS
  Filled 2023-03-16: qty 1

## 2023-03-16 MED ORDER — NITROGLYCERIN 0.4 MG SL SUBL: 0.4000 mg | SUBLINGUAL_TABLET | SUBLINGUAL | Status: DC | PRN

## 2023-03-16 MED ORDER — FENTANYL CITRATE (PF) 250 MCG/5ML IJ SOLN
INTRAMUSCULAR | Status: AC
  Filled 2023-03-16: qty 5

## 2023-03-16 MED ORDER — POTASSIUM CHLORIDE IN NACL 20-0.9 MEQ/L-% IV SOLN
INTRAVENOUS | Status: DC
  Filled 2023-03-16: qty 1000

## 2023-03-16 MED ORDER — ROCURONIUM BROMIDE 10 MG/ML (PF) SYRINGE
PREFILLED_SYRINGE | INTRAVENOUS | Status: DC | PRN
  Administered 2023-03-16: 60 mg via INTRAVENOUS

## 2023-03-16 MED ORDER — HYDROMORPHONE HCL 1 MG/ML IJ SOLN
INTRAMUSCULAR | Status: DC | PRN
  Administered 2023-03-16: .5 mg via INTRAVENOUS

## 2023-03-16 MED ORDER — SUGAMMADEX SODIUM 200 MG/2ML IV SOLN
INTRAVENOUS | Status: AC
  Filled 2023-03-16: qty 2

## 2023-03-16 MED ORDER — PHENYLEPHRINE 80 MCG/ML (10ML) SYRINGE FOR IV PUSH (FOR BLOOD PRESSURE SUPPORT)
PREFILLED_SYRINGE | INTRAVENOUS | Status: AC
  Filled 2023-03-16: qty 10

## 2023-03-16 MED ORDER — OXYCODONE HCL 5 MG PO TABS
5.0000 mg | ORAL_TABLET | ORAL | Status: DC | PRN
  Filled 2023-03-16: qty 1

## 2023-03-16 MED ORDER — PHENOL 1.4 % MT LIQD: 1.0000 | OROMUCOSAL | Status: DC | PRN

## 2023-03-16 MED ORDER — DEXAMETHASONE SODIUM PHOSPHATE 10 MG/ML IJ SOLN
INTRAMUSCULAR | Status: DC | PRN
  Administered 2023-03-16: 10 mg via INTRAVENOUS

## 2023-03-16 MED ORDER — FENTANYL CITRATE (PF) 100 MCG/2ML IJ SOLN: 25.0000 ug | INTRAMUSCULAR | Status: DC | PRN

## 2023-03-16 MED ORDER — OYSTER SHELL CALCIUM/D3 500-5 MG-MCG PO TABS: 1.0000 | ORAL_TABLET | Freq: Every day | ORAL | Status: DC

## 2023-03-16 MED ORDER — PHENYLEPHRINE 80 MCG/ML (10ML) SYRINGE FOR IV PUSH (FOR BLOOD PRESSURE SUPPORT)
PREFILLED_SYRINGE | INTRAVENOUS | Status: DC | PRN
  Administered 2023-03-16: 80 ug via INTRAVENOUS
  Administered 2023-03-16 (×2): 160 ug via INTRAVENOUS

## 2023-03-16 MED ORDER — DEXAMETHASONE SODIUM PHOSPHATE 10 MG/ML IJ SOLN
INTRAMUSCULAR | Status: AC
  Filled 2023-03-16: qty 1

## 2023-03-16 MED ORDER — ACETAMINOPHEN 500 MG PO TABS
1000.0000 mg | ORAL_TABLET | Freq: Four times a day (QID) | ORAL | Status: DC
  Administered 2023-03-16: 1000 mg via ORAL
  Filled 2023-03-16: qty 2

## 2023-03-16 MED ORDER — PROPOFOL 10 MG/ML IV BOLUS
INTRAVENOUS | Status: DC | PRN
Start: 2023-03-16 — End: 2023-03-16
  Administered 2023-03-16: 100 mg via INTRAVENOUS

## 2023-03-16 MED ORDER — HYDROMORPHONE HCL 2 MG PO TABS
4.0000 mg | ORAL_TABLET | ORAL | Status: DC | PRN
  Administered 2023-03-16 (×2): 4 mg via ORAL
  Filled 2023-03-16: qty 1
  Filled 2023-03-16: qty 2

## 2023-03-16 MED ORDER — CARISOPRODOL 350 MG PO TABS: 350.0000 mg | ORAL_TABLET | Freq: Three times a day (TID) | ORAL | Status: DC | PRN

## 2023-03-16 MED ORDER — PANTOPRAZOLE SODIUM 40 MG PO TBEC: 40.0000 mg | DELAYED_RELEASE_TABLET | Freq: Every day | ORAL | Status: DC

## 2023-03-16 MED ORDER — EPHEDRINE SULFATE-NACL 50-0.9 MG/10ML-% IV SOSY
PREFILLED_SYRINGE | INTRAVENOUS | Status: DC | PRN
  Administered 2023-03-16: 5 mg via INTRAVENOUS
  Administered 2023-03-16: 10 mg via INTRAVENOUS

## 2023-03-16 MED ORDER — PROPOFOL 10 MG/ML IV BOLUS
INTRAVENOUS | Status: AC
  Filled 2023-03-16: qty 20

## 2023-03-16 MED ORDER — CEFAZOLIN SODIUM-DEXTROSE 2-4 GM/100ML-% IV SOLN
INTRAVENOUS | Status: AC
  Filled 2023-03-16: qty 100

## 2023-03-16 MED ORDER — 0.9 % SODIUM CHLORIDE (POUR BTL) OPTIME
TOPICAL | Status: DC | PRN
  Administered 2023-03-16: 1000 mL

## 2023-03-16 MED ORDER — HYDROMORPHONE HCL 1 MG/ML IJ SOLN
0.5000 mg | INTRAMUSCULAR | Status: DC | PRN
  Administered 2023-03-16: 0.5 mg via INTRAVENOUS

## 2023-03-16 MED ORDER — ONDANSETRON HCL 4 MG/2ML IJ SOLN
INTRAMUSCULAR | Status: AC
  Filled 2023-03-16: qty 2

## 2023-03-16 MED ORDER — MONTELUKAST SODIUM 10 MG PO TABS: 10.0000 mg | ORAL_TABLET | Freq: Every day | ORAL | Status: DC

## 2023-03-16 MED ORDER — CHLORHEXIDINE GLUCONATE CLOTH 2 % EX PADS: 6.0000 | MEDICATED_PAD | Freq: Once | CUTANEOUS | Status: DC

## 2023-03-16 MED ORDER — EPHEDRINE 5 MG/ML INJ
INTRAVENOUS | Status: AC
  Filled 2023-03-16: qty 5

## 2023-03-16 MED ORDER — LIDOCAINE 2% (20 MG/ML) 5 ML SYRINGE
INTRAMUSCULAR | Status: AC
  Filled 2023-03-16: qty 5

## 2023-03-16 MED ORDER — ASPIRIN 81 MG PO TBEC
81.0000 mg | DELAYED_RELEASE_TABLET | Freq: Every day | ORAL | Status: DC
  Administered 2023-03-16: 81 mg via ORAL
  Filled 2023-03-16: qty 1

## 2023-03-16 MED ORDER — AMISULPRIDE (ANTIEMETIC) 5 MG/2ML IV SOLN
INTRAVENOUS | Status: DC | PRN
Start: 2023-03-16 — End: 2023-03-16
  Administered 2023-03-16: 5 mg via INTRAVENOUS

## 2023-03-16 MED ORDER — PROPOFOL 1000 MG/100ML IV EMUL
INTRAVENOUS | Status: AC
  Filled 2023-03-16: qty 100

## 2023-03-16 MED ORDER — LIDOCAINE-EPINEPHRINE 0.5 %-1:200000 IJ SOLN
INTRAMUSCULAR | Status: AC
  Filled 2023-03-16: qty 50

## 2023-03-16 MED ORDER — ATORVASTATIN CALCIUM 10 MG PO TABS: 20.0000 mg | ORAL_TABLET | Freq: Every day | ORAL | Status: DC

## 2023-03-16 MED ORDER — THROMBIN (RECOMBINANT) 5000 UNITS EX SOLR
CUTANEOUS | Status: DC | PRN
  Administered 2023-03-16: 5 mL via TOPICAL

## 2023-03-16 MED ORDER — ROCURONIUM BROMIDE 10 MG/ML (PF) SYRINGE
PREFILLED_SYRINGE | INTRAVENOUS | Status: AC
  Filled 2023-03-16: qty 10

## 2023-03-16 MED ORDER — ONDANSETRON HCL 4 MG/2ML IJ SOLN
INTRAMUSCULAR | Status: DC | PRN
  Administered 2023-03-16: 4 mg via INTRAVENOUS

## 2023-03-16 MED ORDER — TAMSULOSIN HCL 0.4 MG PO CAPS
0.4000 mg | ORAL_CAPSULE | Freq: Every day | ORAL | Status: DC
  Filled 2023-03-16: qty 1

## 2023-03-16 MED ORDER — VITAMIN D 25 MCG (1000 UNIT) PO TABS
2000.0000 [IU] | ORAL_TABLET | Freq: Every day | ORAL | Status: DC
  Filled 2023-03-16 (×2): qty 2

## 2023-03-16 MED ORDER — LIDOCAINE 2% (20 MG/ML) 5 ML SYRINGE
INTRAMUSCULAR | Status: DC | PRN
  Administered 2023-03-16: 60 mg via INTRAVENOUS

## 2023-03-16 MED ORDER — SODIUM CHLORIDE 0.9% FLUSH: 3.0000 mL | INTRAVENOUS | Status: DC | PRN

## 2023-03-16 MED ORDER — THROMBIN 5000 UNITS EX KIT
PACK | CUTANEOUS | Status: AC
  Filled 2023-03-16: qty 2

## 2023-03-16 MED ORDER — ALBUMIN HUMAN 5 % IV SOLN
INTRAVENOUS | Status: DC | PRN
Start: 2023-03-16 — End: 2023-03-16

## 2023-03-16 MED ORDER — AMISULPRIDE (ANTIEMETIC) 5 MG/2ML IV SOLN
INTRAVENOUS | Status: AC
  Filled 2023-03-16: qty 2

## 2023-03-16 MED ORDER — MELATONIN 5 MG PO TABS: 10.0000 mg | ORAL_TABLET | Freq: Every evening | ORAL | Status: DC | PRN

## 2023-03-16 MED ORDER — GABAPENTIN 400 MG PO CAPS: 1800.0000 mg | ORAL_CAPSULE | Freq: Every day | ORAL | Status: DC

## 2023-03-16 MED ORDER — ACETAMINOPHEN 650 MG RE SUPP: 650.0000 mg | RECTAL | Status: DC | PRN

## 2023-03-16 MED ORDER — ONDANSETRON HCL 4 MG PO TABS: 4.0000 mg | ORAL_TABLET | Freq: Four times a day (QID) | ORAL | Status: DC | PRN

## 2023-03-16 MED ORDER — ONDANSETRON HCL 4 MG/2ML IJ SOLN: 4.0000 mg | Freq: Four times a day (QID) | INTRAMUSCULAR | Status: DC | PRN

## 2023-03-16 MED ORDER — POLYETHYL GLYCOL-PROPYL GLYCOL 0.4-0.3 % OP GEL
Freq: Two times a day (BID) | OPHTHALMIC | Status: DC
  Filled 2023-03-16 (×2): qty 10

## 2023-03-16 MED ORDER — ALBUTEROL SULFATE (2.5 MG/3ML) 0.083% IN NEBU: 2.5000 mg | INHALATION_SOLUTION | Freq: Four times a day (QID) | RESPIRATORY_TRACT | Status: DC | PRN

## 2023-03-16 MED ORDER — CHLORHEXIDINE GLUCONATE 0.12 % MT SOLN: 15.0000 mL | Freq: Once | OROMUCOSAL | Status: AC

## 2023-03-16 MED ORDER — ACETAMINOPHEN 500 MG PO TABS
ORAL_TABLET | ORAL | Status: AC
Start: 2023-03-16 — End: 2023-03-16
  Filled 2023-03-16: qty 2

## 2023-03-16 MED ORDER — HYDROMORPHONE HCL 1 MG/ML IJ SOLN
INTRAMUSCULAR | Status: AC
  Filled 2023-03-16: qty 0.5

## 2023-03-16 MED ORDER — PROPOFOL 500 MG/50ML IV EMUL
INTRAVENOUS | Status: DC | PRN
  Administered 2023-03-16: 50 ug/kg/min via INTRAVENOUS

## 2023-03-16 MED ORDER — SODIUM CHLORIDE 0.9 % IV SOLN
250.0000 mL | INTRAVENOUS | Status: DC
  Administered 2023-03-16: 250 mL via INTRAVENOUS

## 2023-03-16 MED ORDER — AMISULPRIDE (ANTIEMETIC) 5 MG/2ML IV SOLN: 10.0000 mg | Freq: Once | INTRAVENOUS | Status: AC | PRN

## 2023-03-16 MED ORDER — GABAPENTIN 400 MG PO CAPS
1200.0000 mg | ORAL_CAPSULE | Freq: Every morning | ORAL | Status: DC
  Filled 2023-03-16 (×2): qty 3

## 2023-03-16 MED ORDER — MAGNESIUM OXIDE 400 MG PO TABS: 400.0000 mg | ORAL_TABLET | Freq: Every day | ORAL | Status: DC

## 2023-03-16 MED ORDER — LIDOCAINE-EPINEPHRINE 0.5 %-1:200000 IJ SOLN
INTRAMUSCULAR | Status: DC | PRN
  Administered 2023-03-16: 40 mL

## 2023-03-16 MED ORDER — AMISULPRIDE (ANTIEMETIC) 5 MG/2ML IV SOLN
INTRAVENOUS | Status: AC
  Administered 2023-03-16: 10 mg via INTRAVENOUS
  Filled 2023-03-16: qty 4

## 2023-03-16 MED ORDER — CHLORHEXIDINE GLUCONATE 0.12 % MT SOLN
OROMUCOSAL | Status: AC
  Administered 2023-03-16: 15 mL via OROMUCOSAL
  Filled 2023-03-16: qty 15

## 2023-03-16 MED ORDER — AMLODIPINE BESYLATE 5 MG PO TABS
5.0000 mg | ORAL_TABLET | Freq: Every day | ORAL | Status: DC
  Administered 2023-03-16: 5 mg via ORAL
  Filled 2023-03-16: qty 1

## 2023-03-16 MED ORDER — MENTHOL 3 MG MT LOZG: 1.0000 | LOZENGE | OROMUCOSAL | Status: DC | PRN

## 2023-03-16 MED ORDER — METOPROLOL TARTRATE 25 MG PO TABS: 25.0000 mg | ORAL_TABLET | Freq: Two times a day (BID) | ORAL | Status: DC

## 2023-03-16 MED ORDER — ACETAMINOPHEN 500 MG PO TABS
1000.0000 mg | ORAL_TABLET | Freq: Once | ORAL | Status: AC
  Administered 2023-03-16: 1000 mg via ORAL

## 2023-03-16 MED ORDER — LIDOCAINE-EPINEPHRINE 1 %-1:100000 IJ SOLN
INTRAMUSCULAR | Status: AC
  Filled 2023-03-16: qty 1

## 2023-03-16 MED ORDER — FENTANYL CITRATE (PF) 250 MCG/5ML IJ SOLN
INTRAMUSCULAR | Status: DC | PRN
  Administered 2023-03-16: 100 ug via INTRAVENOUS
  Administered 2023-03-16: 50 ug via INTRAVENOUS
  Administered 2023-03-16: 100 ug via INTRAVENOUS

## 2023-03-16 MED ORDER — FLUTICASONE PROPIONATE 50 MCG/ACT NA SUSP
1.0000 | Freq: Every day | NASAL | Status: DC
  Filled 2023-03-16: qty 16

## 2023-03-16 MED ORDER — SUGAMMADEX SODIUM 200 MG/2ML IV SOLN
INTRAVENOUS | Status: DC | PRN
  Administered 2023-03-16: 200 mg via INTRAVENOUS

## 2023-03-16 MED ORDER — ORAL CARE MOUTH RINSE: 15.0000 mL | Freq: Once | OROMUCOSAL | Status: AC

## 2023-03-16 MED ORDER — PHENYLEPHRINE HCL-NACL 20-0.9 MG/250ML-% IV SOLN
INTRAVENOUS | Status: DC | PRN
  Administered 2023-03-16: 50 ug/min via INTRAVENOUS

## 2023-03-16 MED ORDER — LACTATED RINGERS IV SOLN: INTRAVENOUS | Status: DC

## 2023-03-16 MED ORDER — SODIUM CHLORIDE 0.9% FLUSH: 3.0000 mL | Freq: Two times a day (BID) | INTRAVENOUS | Status: DC

## 2023-03-16 SURGICAL SUPPLY — 40 items
BAG COUNTER SPONGE SURGICOUNT (BAG) ×1 IMPLANT
BENZOIN TINCTURE PRP APPL 2/3 (GAUZE/BANDAGES/DRESSINGS) IMPLANT
BLADE CLIPPER SURG (BLADE) IMPLANT
BUR MATCHSTICK NEURO 3.0 LAGG (BURR) ×1 IMPLANT
BUR PRECISION FLUTE 5.0 (BURR) IMPLANT
CANISTER SUCT 3000ML PPV (MISCELLANEOUS) ×1 IMPLANT
DERMABOND ADVANCED .7 DNX12 (GAUZE/BANDAGES/DRESSINGS) ×1 IMPLANT
DRAPE C-ARM 42X72 X-RAY (DRAPES) IMPLANT
DRAPE LAPAROTOMY 100X72X124 (DRAPES) ×1 IMPLANT
DRAPE MICROSCOPE SLANT 54X150 (MISCELLANEOUS) ×1 IMPLANT
DRAPE SURG 17X23 STRL (DRAPES) ×1 IMPLANT
DURAPREP 26ML APPLICATOR (WOUND CARE) ×1 IMPLANT
ELECT REM PT RETURN 9FT ADLT (ELECTROSURGICAL) ×1 IMPLANT
ELECTRODE REM PT RTRN 9FT ADLT (ELECTROSURGICAL) ×1 IMPLANT
GAUZE 4X4 16PLY ~~LOC~~+RFID DBL (SPONGE) IMPLANT
GAUZE SPONGE 4X4 12PLY STRL (GAUZE/BANDAGES/DRESSINGS) IMPLANT
GLOVE ECLIPSE 6.5 STRL STRAW (GLOVE) ×1 IMPLANT
GLOVE EXAM NITRILE XL STR (GLOVE) IMPLANT
GOWN STRL REUS W/ TWL LRG LVL3 (GOWN DISPOSABLE) ×2 IMPLANT
GOWN STRL REUS W/ TWL XL LVL3 (GOWN DISPOSABLE) IMPLANT
GOWN STRL REUS W/TWL 2XL LVL3 (GOWN DISPOSABLE) IMPLANT
KIT BASIN OR (CUSTOM PROCEDURE TRAY) ×1 IMPLANT
KIT TURNOVER KIT B (KITS) ×1 IMPLANT
NDL HYPO 25X1 1.5 SAFETY (NEEDLE) ×1 IMPLANT
NDL SPNL 18GX3.5 QUINCKE PK (NEEDLE) IMPLANT
NEEDLE HYPO 25X1 1.5 SAFETY (NEEDLE) ×1 IMPLANT
NEEDLE SPNL 18GX3.5 QUINCKE PK (NEEDLE) ×1 IMPLANT
NS IRRIG 1000ML POUR BTL (IV SOLUTION) ×1 IMPLANT
PACK LAMINECTOMY NEURO (CUSTOM PROCEDURE TRAY) ×1 IMPLANT
PAD ARMBOARD POSITIONER FOAM (MISCELLANEOUS) ×3 IMPLANT
SPIKE FLUID TRANSFER (MISCELLANEOUS) ×1 IMPLANT
SPONGE SURGIFOAM ABS GEL SZ50 (HEMOSTASIS) ×1 IMPLANT
SPONGE T-LAP 4X18 ~~LOC~~+RFID (SPONGE) IMPLANT
STRIP CLOSURE SKIN 1/2X4 (GAUZE/BANDAGES/DRESSINGS) IMPLANT
SUT VIC AB 0 CT1 18XCR BRD8 (SUTURE) ×1 IMPLANT
SUT VIC AB 2-0 CT1 18 (SUTURE) ×1 IMPLANT
SUT VIC AB 3-0 SH 8-18 (SUTURE) ×1 IMPLANT
TOWEL GREEN STERILE (TOWEL DISPOSABLE) ×1 IMPLANT
TOWEL GREEN STERILE FF (TOWEL DISPOSABLE) ×1 IMPLANT
WATER STERILE IRR 1000ML POUR (IV SOLUTION) ×1 IMPLANT

## 2023-03-16 NOTE — Anesthesia Procedure Notes (Signed)
 Procedure Name: Intubation Date/Time: 03/16/2023 9:35 AM  Performed by: Alwyn Ren, CRNAPre-anesthesia Checklist: Patient identified, Emergency Drugs available, Suction available and Patient being monitored Patient Re-evaluated:Patient Re-evaluated prior to induction Oxygen Delivery Method: Circle system utilized Preoxygenation: Pre-oxygenation with 100% oxygen Induction Type: IV induction Ventilation: Mask ventilation without difficulty Laryngoscope Size: Glidescope and 3 Tube type: Oral Tube size: 7.0 mm Number of attempts: 1 Airway Equipment and Method: Stylet and Oral airway Placement Confirmation: ETT inserted through vocal cords under direct vision, positive ETCO2 and breath sounds checked- equal and bilateral Secured at: 21 cm Tube secured with: Tape Dental Injury: Teeth and Oropharynx as per pre-operative assessment

## 2023-03-16 NOTE — Progress Notes (Signed)
 Patient in no acute distress nor complaints of pain nor discomfort; incision on back is clean, dry and intact; No c/o pain at this time. Room was checked and accounted for all patient's belongings; discharge instructions concerning her medications, incision care, follow up appointment and when to call the doctor as needed were all discussed with patient by RN and she expressed understanding on the instructions given.

## 2023-03-16 NOTE — Plan of Care (Signed)

## 2023-03-16 NOTE — Discharge Instructions (Addendum)
  Wound Care Keep incision clean and dry Do not put any creams, lotions, or ointments on incision. You are fine to shower. Let water run over incision and pat dry.   Activity Walk each and every day, increasing distance each day. No lifting greater than 8 lbs.  No lifting no bending no twisting no driving .   Diet Resume your normal diet.   Call Your Doctor If Any of These Occur Redness, drainage, or swelling at the wound.  Temperature greater than 101 degrees. Severe pain not relieved by pain medication. Incision starts to come apart.  Follow Up Appt Call [phone] if you have one or any problem.       Lumbar Laminectomy Care After A laminectomy is an operation performed on the spine. The purpose is to decompress the spinal cord and/or the nerve roots.  The time in surgery depends on the findings in surgery and what is necessary to correct the problems. HOME CARE INSTRUCTIONS  Check the cut (incision) made by the surgeon twice a day for signs of infection. Some signs of infection may include:  A foul smelling, greenish or yellowish discharge from the wound.  Increased pain.  Increased redness over the incision (operative) site.  The skin edges may separate.  Flu-like symptoms (problems).  A temperature above 101.5 F (38.6 C).  Change your bandages in about 24 to 36 hours following surgery or as directed.  You may shower tomorrow. Avoid bathtubs, swimming pools and hot tubs for three weeks or until your incision has healed completely. If you have stitches or staples, they may be removed 2 to 3 weeks after surgery, or as directed by your doctor. This may be done by your doctor or caregiver.  You may walk as much as you like. No need to exercise at this time. Limit lifting to ~10lbs. Weight reduction may be beneficial if you are overweight.  Daily exercise is helpful to prevent the return of problems. Walking is permitted. You may use a treadmill without an incline. Cut  down on activities and exercise if you have discomfort. You may also go up and down stairs as much as you can tolerate.  DO NOT lift anything heavier than 10 . Avoid bending or twisting at the waist. Always bend your knees when lifting.  Maintain strength and range of motion as instructed.  Do not drive for 2 to 3 weeks, or as directed by your doctors. You may be a passenger for 20 to 30 minute trips. Lying back in the passenger seat may be more comfortable for you. Always wear a seatbelt.  Limit your sitting in a regular chair to 20 to 30 minutes at a time. There are no limitations for sitting in a recliner. You should lie down or walk in between sitting periods.  Only take over-the-counter or prescription medicines for pain, discomfort, or fever as directed by your caregiver.  SEEK MEDICAL CARE IF:  There is increased bleeding (more than a small spot) from the wound.  You notice redness, swelling, or increasing pain in the wound.  Pus is coming from wound.  You develop an unexplained oral temperature above 102 F (38.9 C) develops.  You notice a foul smell coming from the wound or dressing.  You have increasing pain in your wound.  SEEK IMMEDIATE MEDICAL CARE IF:  You develop a rash.  You have difficulty breathing.  You develop any allergic problems to medicines given.

## 2023-03-16 NOTE — Transfer of Care (Signed)
 Immediate Anesthesia Transfer of Care Note  [name]  Procedure(s) Performed: FORAMINOTOMY 1 LEVEL (Left: Back)  Patient Location: PACU  Anesthesia Type:General  Level of Consciousness: awake and sedated  Airway & Oxygen Therapy: Patient Spontanous Breathing and Patient connected to face mask oxygen  Post-op Assessment: Report given to RN and Post -op Vital signs reviewed and stable  Post vital signs: Reviewed and stable  Last Vitals:  Vitals Value Taken Time  BP 133/67 03/16/23 1135  Temp 36.4 C 03/16/23 1135  Pulse 87 03/16/23 1138  Resp 9 03/16/23 1138  SpO2 98 % 03/16/23 1138  Vitals shown include unfiled device data.  Last Pain:  Vitals:   03/16/23 1135  TempSrc:   PainSc: Asleep         Complications: No notable events documented.

## 2023-03-16 NOTE — Discharge Summary (Signed)
 Physician Discharge Summary  Patient ID: [name] Long MRN: [id number] DOB/AGE: July 21, 1940 83 y.o.  Admit date: 03/16/2023 Discharge date: 03/16/2023  Admission Diagnoses:lateral recess stenosis left  Lumbar 3/4  Discharge Diagnoses:  Principal Problem:   Stenosis of lateral recess of lumbar spine   Discharged Condition: good  Hospital Course: [name] Long was taken to the operating room for an uncomplicated semihemilaminectomy and foraminotomy at L3. Post op she is ambulating, voiding, and tolerating a regular diet. The incision is clean, dry, without signs of infection.   Treatments: surgery: semihemilaminectomy L3 for lateral recess decompression  Discharge Exam: Blood pressure 128/70, pulse 81, temperature (!) 97.5 F (36.4 C), temperature source Oral, resp. rate 20, height 5\' 4"  (1.626 m), weight 83.9 kg, SpO2 94%. General appearance: alert, cooperative, appears stated age, and mild distress  Disposition: Discharge disposition: 01-Home or Self Care      Stenosis of lateral recess of lumbar spine  Allergies as of 03/16/2023       Reactions   Dimenhydrinate Other (See Comments)   "locks up kidneys"   Morphine And Codeine Nausea And Vomiting   Percocet [oxycodone-acetaminophen] Other (See Comments)   hallucinations   Zolpidem Other (See Comments)   Unusual behavior        Medication List     TAKE these medications    acetaminophen 500 MG tablet Commonly known as: TYLENOL Take 500 mg by mouth every 6 (six) hours as needed for moderate pain.   albuterol 108 (90 Base) MCG/ACT inhaler Commonly known as: Proventil HFA INHALE 2 PUFF AS DIRECTED FOUR TIMES A DAY AS NEEDED   amLODipine 5 MG tablet Commonly known as: NORVASC Take 1 tablet (5 mg total) by mouth daily.   aspirin EC 81 MG tablet Take 81 mg by mouth daily. Swallow whole.   atorvastatin 20 MG tablet Commonly known as: LIPITOR Take 1 tablet (20 mg total) by mouth daily.   CALTRATE 600+D3 PO Take  1 tablet by mouth daily.   carisoprodol 350 MG tablet Commonly known as: SOMA Take 350 mg by mouth every 8 (eight) hours as needed (for arthritis pain).   esomeprazole 20 MG capsule Commonly known as: NEXIUM Take 20 mg by mouth in the morning and at bedtime.   fluticasone 50 MCG/ACT nasal spray Commonly known as: FLONASE Place 1 spray into both nostrils daily.   gabapentin 600 MG tablet Commonly known as: NEURONTIN Take 1,200-1,800 mg by mouth See admin instructions. Take 2 tablets (1200 mg) by mouth in the morning & take 3 tablets (1800 mg) by mouth at night   HYDROmorphone 4 MG tablet Commonly known as: DILAUDID Take 1 tablet (4 mg total) by mouth every 4 (four) hours as needed for severe pain.   magnesium oxide 400 MG tablet Commonly known as: MAG-OX Take 400 mg by mouth at bedtime.   Melatonin 10 MG Caps Take 10 mg by mouth at bedtime as needed (sleep).   metoprolol tartrate 25 MG tablet Commonly known as: LOPRESSOR TAKE 1 TABLET BY MOUTH TWICE A DAY   montelukast 10 MG tablet Commonly known as: SINGULAIR TAKE 1 TABLET BY MOUTH DAILY   nitroGLYCERIN 0.4 MG SL tablet Commonly known as: NITROSTAT Place 1 tablet (0.4 mg total) under the tongue every 5 (five) minutes x 3 doses as needed for chest pain.   PRESERVISION AREDS 2 PO Take 1 tablet by mouth in the morning and at bedtime.   Repatha SureClick 140 MG/ML Soaj Generic drug: Evolocumab INJECT 140 MG  INTO THE SKIN EVERY 14 DAYS   silodosin 8 MG Caps capsule Commonly known as: RAPAFLO Take 1 capsule (8 mg total) by mouth daily.   SYSTANE OP Place 1 drop into both eyes 2 (two) times daily.   Vitamin D 50 MCG (2000 UT) tablet Take 2,000 Units by mouth daily.        Follow-up Information     Coletta Memos, MD Follow up.   Specialty: Neurosurgery Why: keep your scheduled appointment Contact information: [address] [place] [address] [phone]                  Signed: Coletta Memos 03/16/2023, 5:20 PM

## 2023-03-16 NOTE — H&P (Signed)
 BP 134/89   Pulse 93   Temp 98.1 F (36.7 C) (Oral)   Resp 18   Ht 5\' 4"  (1.626 m)   Wt 83.9 kg   SpO2 92%   BMI 31.76 kg/m  [name] Long returns today.  She is still in a great deal of pain, 7/10.  We looked at the lumbar spine previously.  At L3-4, she has significant amount of left foraminal narrowing.  I did review the film again.  I will take a look at 2-3 but for sure L3-4 will be encompassed in this decompressive surgery.  She would like to proceed with operative decompression.  She is alert, oriented by 4, answers all questions appropriately.  Memory, language, attention span, and fund of knowledge are normal.  Speech is clear and fluent.  She weighs 190 pounds.   Risks and benefits, bleeding, infection, no relief, need for further surgery were discussed.  She understands and wishes to proceed. Allergies  Allergen Reactions   Dimenhydrinate Other (See Comments)    "locks up kidneys"   Morphine And Codeine Nausea And Vomiting   Percocet [Oxycodone-Acetaminophen] Other (See Comments)    hallucinations   Zolpidem Other (See Comments)    Unusual behavior   Past Medical History:  Diagnosis Date   Abnormal EKG 01/12/2011   Allergy    RHINITIS   Anemia    Arthritis    neck, back knees, hips   Asthma    Asthma 01/12/2011   Back pain, chronic--plans were for cervical disc surgery week of 01/16/11 01/12/2011   Blood clot in vein 2004   L leg   Blood transfusion without reported diagnosis 2004   Cancer [facility])    Kidney   Chronic kidney disease    Tumor removed from right kidney   Chronic low back pain 2020   Tumor eemoved from kidney,  Has another one that is being monitired.   Complication of anesthesia    post op bladder retention after surgery, "can't void"   Complication of anesthesia    Laryngeal Spasms (has not occured during surgery/intubation)   Connective tissue disease (HCC)    COPD (chronic obstructive pulmonary disease) (HCC)    Coronary artery disease    s/p CABG  x 3 in 2013, hx of PCI with DES   Crescendo angina, with EKG changes, new. 01/12/2011   Dyspnea    Fibromyalgia    Fibromyalgia    GERD (gastroesophageal reflux disease)    Headache    migraine - several in a yr.    History of stress test 04/2011   Normal stress test   Hx of echocardiogram 07/19/2011   EF 50-55%. There is mild annular calcification, there is trace migtral regurgitation, there is trace tricupid regurgitation, pericardium is thick, there is no pericardial effusion.   Hyperlipidemia    Hypertension    Myocardial infarction [facility]) 2013   triple heart bypass   Neuromuscular disorder (HCC)    fibromyalgia    Osteoporosis    OSTEOPENIA   Personal history of colonic polyp - adenoma 08/05/2013   08/2013 - 7 mm adenoma - consider repeat colonoscopy 2020 (age 48)   Pneumonia 2018   numerous times, only admitted one time for pneumonia but she has had pneumonia numerous times;   02-19-2018 END OF NOVEMBER 2019  WAS ADMITTED FOR ABOUT  A WEEK DUE TO "DOUBLE PNEUMONIA", REPORTS TODAY FEELING BACK TO HER NORMAL , DENIES SOB, O2 SAT AT 94 ROOM AIR , AFEBRILE, OTHER VSS  PONV (postoperative nausea and vomiting)    PPD positive    Right carotid bruit    Status post coronary artery bypass grafting    Urinary incontinence    post op, "sleepy bladder", urinar retention    Past Surgical History:  Procedure Laterality Date   ABDOMINAL HYSTERECTOMY  1981   ANTERIOR CERVICAL DECOMP/DISCECTOMY FUSION N/A 07/14/2022   Procedure: Cervical three-four Anterior Cervical Decompression Fusion;  Surgeon: Coletta Memos, MD;  Location: [facility] OR;  Service: Neurosurgery;  Laterality: N/A;   APPENDECTOMY  1946   BIOPSY BREAST Left 03/27/2019   Benign fibroaipose tissue no evidence of malignancy   BLADDER REPAIR  1985   BREAST SURGERY  1992   REDUCTION, x3 surgeries overall on breast for augmentation    CARDIAC CATHETERIZATION  2013   CARPAL TUNNEL RELEASE Bilateral 1988, 1999   CERVICAL FUSION    1992, 1998, 1999   CORONARY ARTERY BYPASS GRAFT  01/14/2011   Procedure: CORONARY ARTERY BYPASS GRAFTING (CABG);  Surgeon: Kathlee Nations Suann Larry, MD;  Location: [facility] OR;  Service: Open Heart Surgery;  Laterality: N/A;  CABG x three, using right greater saphenous vein harvested endoscopically   CORONARY ATHERECTOMY N/A 09/02/2020   Procedure: CORONARY ATHERECTOMY;  Surgeon: Swaziland, Peter M, MD;  Location: [facility] INVASIVE CV LAB;  Service: Cardiovascular;  Laterality: N/A;   CORONARY STENT INTERVENTION N/A 09/02/2020   Procedure: CORONARY STENT INTERVENTION;  Surgeon: Swaziland, Peter M, MD;  Location: [facility] INVASIVE CV LAB;  Service: Cardiovascular;  Laterality: N/A;   CORONARY ULTRASOUND/IVUS N/A 09/02/2020   Procedure: Intravascular Ultrasound/IVUS;  Surgeon: Swaziland, Peter M, MD;  Location: [facility] INVASIVE CV LAB;  Service: Cardiovascular;  Laterality: N/A;   EYE SURGERY Bilateral 1985   radiokerototomy   LEFT HEART CATH AND CORS/GRAFTS ANGIOGRAPHY N/A 08/31/2020   Procedure: LEFT HEART CATH AND CORS/GRAFTS ANGIOGRAPHY;  Surgeon: Lyn Records, MD;  Location: [facility] INVASIVE CV LAB;  Service: Cardiovascular;  Laterality: N/A;   LEFT HEART CATHETERIZATION WITH CORONARY ANGIOGRAM N/A 01/13/2011   Procedure: LEFT HEART CATHETERIZATION WITH CORONARY ANGIOGRAM;  Surgeon: Lennette Bihari, MD;  Location: [facility] CATH LAB;  Service: Cardiovascular;  Laterality: N/A;   PERIPHERAL INTRAVASCULAR LITHOTRIPSY  09/02/2020   Procedure: INTRAVASCULAR LITHOTRIPSY;  Surgeon: Swaziland, Peter M, MD;  Location: [facility] INVASIVE CV LAB;  Service: Cardiovascular;;   PUBOVAGINAL SLING  2002   ROBOT ASSISTED LAPAROSCOPIC NEPHRECTOMY Right 02/27/2018   Procedure: XI ROBOTIC ASSISTE PARTIAL NEPHRECTOMY;  Surgeon: Rene Paci, MD;  Location: [facility] ORS;  Service: Urology;  Laterality: Right;   STERNAL WOUND DEBRIDEMENT  07/27/2011   Procedure: STERNAL WOUND DEBRIDEMENT;  followed by wound vac Surgeon: [name], MD;  Location: [facility] OR;  Service: Open  Heart Surgery;  Laterality: N/A;   TONSILLECTOMY  1952   TOTAL HIP ARTHROPLASTY Right 08/22/2016   Procedure: TOTAL HIP ARTHROPLASTY ANTERIOR APPROACH;  Surgeon: Marcene Corning, MD;  Location: [facility] OR;  Service: Orthopedics;  Laterality: Right;   TOTAL KNEE ARTHROPLASTY Right 07/22/2019   Procedure: RIGHT TOTAL KNEE ARTHROPLASTY;  Surgeon: Marcene Corning, MD;  Location: [facility] ORS;  Service: Orthopedics;  Laterality: Right;   TRIGGER FINGER RELEASE Right 2014   4th finger   TROCHANTERIC BURSA EXCISION Right    bursa removed, R hip   TUBAL LIGATION     Family History  Adopted: Yes  Problem Relation Age of Onset   Hypertension Brother    Social History   Socioeconomic History   Marital status: Married  Spouse name: Not on file   Number of children: Not on file   Years of education: Not on file   Highest education level: Some college, no degree  Occupational History   Not on file  Tobacco Use   Smoking status: Former    Current packs/day: 0.00    Types: Cigarettes    Quit date: 01/12/1980    Years since quitting: 43.2   Smokeless tobacco: Never  Vaping Use   Vaping status: Never Used  Substance and Sexual Activity   Alcohol use: Yes    Comment: occassionally   Drug use: No   Sexual activity: Yes  Other Topics Concern   Not on file  Social History Narrative   Right handed   Social Drivers of Health   Financial Resource Strain: Low Risk  (01/26/2023)   Overall Financial Resource Strain (CARDIA)    Difficulty of Paying Living Expenses: Not hard at all  Food Insecurity: No Food Insecurity (01/26/2023)   Hunger Vital Sign    Worried About Running Out of Food in the Last Year: Never true    Ran Out of Food in the Last Year: Never true  Transportation Needs: No Transportation Needs (01/26/2023)   PRAPARE - Administrator, Civil Service (Medical): No    Lack of Transportation (Non-Medical): No  Physical Activity: Insufficiently Active (01/26/2023)   Exercise Vital  Sign    Days of Exercise per Week: 3 days    Minutes of Exercise per Session: 30 min  Stress: No Stress Concern Present (01/26/2023)   [employer] of Occupational Health - Occupational Stress Questionnaire    Feeling of Stress : Not at all  Social Connections: Socially Integrated (01/26/2023)   Social Connection and Isolation Panel [NHANES]    Frequency of Communication with Friends and Family: More than three times a week    Frequency of Social Gatherings with Friends and Family: Twice a week    Attends Religious Services: 1 to 4 times per year    Active Member of [employer] or Organizations: Yes    Attends Engineer, structural: More than 4 times per year    Marital Status: Married  Catering manager Violence: Not on file   Prior to Admission medications   Medication Sig Start Date End Date Taking? Authorizing Provider  acetaminophen (TYLENOL) 500 MG tablet Take 500 mg by mouth every 6 (six) hours as needed for moderate pain.   Yes [provider]  amLODipine (NORVASC) 5 MG tablet Take 1 tablet (5 mg total) by mouth daily. 08/24/22  Yes Ronney Asters, NP  aspirin EC 81 MG tablet Take 81 mg by mouth daily. Swallow whole.   Yes [provider]  atorvastatin (LIPITOR) 20 MG tablet Take 1 tablet (20 mg total) by mouth daily. 08/24/22 08/19/23 Yes Cleaver, Thomasene Ripple, NP  Calcium Carb-Cholecalciferol (CALTRATE 600+D3 PO) Take 1 tablet by mouth daily.   Yes [provider]  carisoprodol (SOMA) 350 MG tablet Take 350 mg by mouth every 8 (eight) hours as needed (for arthritis pain).   Yes [provider]  Cholecalciferol (VITAMIN D) 50 MCG (2000 UT) tablet Take 2,000 Units by mouth daily.   Yes [provider]  esomeprazole (NEXIUM) 20 MG capsule Take 20 mg by mouth in the morning and at bedtime.   Yes [provider]  Evolocumab (REPATHA SURECLICK) 140 MG/ML SOAJ INJECT 140 MG INTO THE SKIN EVERY 14 DAYS 11/29/22  Yes Runell Gess, MD  fluticasone (FLONASE) 50 MCG/ACT nasal spray Place 1 spray into both nostrils daily.   Yes [provider]  gabapentin (NEURONTIN) 600 MG tablet Take 1,200-1,800 mg by mouth See admin instructions. Take 2 tablets (1200 mg) by mouth in the morning & take 3 tablets (1800 mg) by mouth at night   Yes [provider]  HYDROmorphone (DILAUDID) 4 MG tablet Take 1 tablet (4 mg total) by mouth every 4 (four) hours as needed for severe pain. 02/27/18  Yes Dancy, Marchelle Folks, PA-C  magnesium oxide (MAG-OX) 400 MG tablet Take 400 mg by mouth at bedtime.   Yes [provider]  Melatonin 10 MG CAPS Take 10 mg by mouth at bedtime as needed (sleep).   Yes [provider]  metoprolol tartrate (LOPRESSOR) 25 MG tablet TAKE 1 TABLET BY MOUTH TWICE A DAY 02/05/23  Yes Ronnald Nian, MD  montelukast (SINGULAIR) 10 MG tablet TAKE 1 TABLET BY MOUTH DAILY 02/05/23  Yes Ronnald Nian, MD  Multiple Vitamins-Minerals (PRESERVISION AREDS 2 PO) Take 1 tablet by mouth in the morning and at bedtime.   Yes [provider]  nitroGLYCERIN (NITROSTAT) 0.4 MG SL tablet Place 1 tablet (0.4 mg total) under the tongue every 5 (five) minutes x 3 doses as needed for chest pain. 11/28/22  Yes Runell Gess, MD  Polyethyl Glycol-Propyl Glycol (SYSTANE OP) Place 1 drop into both eyes 2 (two) times daily.   Yes [provider]  silodosin (RAPAFLO) 8 MG CAPS capsule Take 1 capsule (8 mg total) by mouth daily. 01/24/22  Yes Ronnald Nian, MD  albuterol (PROVENTIL HFA) 108 [phone] Base) MCG/ACT inhaler INHALE 2 PUFF AS DIRECTED FOUR TIMES A DAY AS NEEDED 01/24/22   Ronnald Nian, MD   Will take to OR for lumbar decompression at L3/4 for relief of radicular pain. Risks and benefits including but not limited to bleeding, infection, no pain relief, nerve damage, bowel and or bladder dysfunction, and other risks were explained. She understands and wishes to proceed.

## 2023-03-16 NOTE — Evaluation (Signed)
 Occupational Therapy Evaluation Patient Details Name: [name] Long MRN: [id number] DOB: 04-09-1940 Today's Date: 03/16/2023   History of Present Illness   Pt is a 83 yo female presenting to [facility] on 03/16/23 for elective L3-L4 decompression. Pmh of arthritis, CABG, GERD, HLD, HTN, Dyspnea, MI.     Clinical Impressions Pt admitted for above, PTA pt reports being very active and ind in ADLs/mobility. Educated pt on post op back precautions and compensatory strategies for ADLs/iADLs including altering home setup to refrain from bending. Pt able to verbally follow along with precaution but needs occasional verbal cues to maintain them during activity, she is able to ambulate with supervision but needing CGA for longer distance (she claims due to medications affecting balance). With strategies pt able to complete ADLs with min A to setup. Pt doing better with simple education of back precautions such as keeping knees and shoulder aligned to prevent twisting. OT to continue to follow pt acutely to reinforce precautions and further educate on compensatory strategies for iADLs/ADLs. No post acute OT recommended.      If plan is discharge home, recommend the following:   Assistance with cooking/housework     Functional Status Assessment   Patient has had a recent decline in their functional status and demonstrates the ability to make significant improvements in function in a reasonable and predictable amount of time.     Equipment Recommendations   None recommended by OT     Recommendations for Other Services         Precautions/Restrictions   Precautions Precautions: Back Precaution Booklet Issued: Yes (comment) Recall of Precautions/Restrictions: Impaired Precaution/Restrictions Comments: Pt able to state back precautions, needs occassional verbal cues to follow them during functional activity Restrictions Weight Bearing Restrictions Per Provider Order: No     Mobility Bed  Mobility Overal bed mobility: Needs Assistance Bed Mobility: Rolling, Sidelying to Sit Rolling: Supervision Sidelying to sit: Supervision            Transfers Overall transfer level: Needs assistance Equipment used: None Transfers: Sit to/from Stand Sit to Stand: Supervision                  Balance Overall balance assessment: Needs assistance Sitting-balance support: No upper extremity supported, Feet supported Sitting balance-Leahy Scale: Good     Standing balance support: During functional activity Standing balance-Leahy Scale: Fair                             ADL either performed or assessed with clinical judgement   ADL Overall ADL's : Needs assistance/impaired Eating/Feeding: Independent;Sitting   Grooming: Standing;Supervision/safety   Upper Body Bathing: Sitting;Set up   Lower Body Bathing: Sitting/lateral leans;Minimal assistance Lower Body Bathing Details (indicate cue type and reason): Pt not able to get the lower parts of her LEs, discussed using Long handled sponge and her shower seat at home Upper Body Dressing : Independent   Lower Body Dressing: Sitting/lateral leans;Set up Lower Body Dressing Details (indicate cue type and reason): using Lasso method and able to lift BLEs up high enough. Able to achieve enough of the figure four position to doff/don socks Toilet Transfer: Supervision/safety;Ambulation   Toileting- Clothing Manipulation and Hygiene: Sit to/from stand;Supervision/safety Toileting - Clothing Manipulation Details (indicate cue type and reason): Cues for no twisting     Functional mobility during ADLs: Contact guard assist General ADL Comments: Pt ambulatory in room with supervision, CGA needed once out in hall.  Pt notes that she is a bit "loopy" from medications. Discussed use of reacher, if able to procure one to assist with picking up items off the floor. Discussed having her plants propped up on a stool/seat above  waist level if looking to continue with tending to them. Reinforced "in a box" concept with pt as she has impaired awareness of precautions during some functional activites, did better with education on keeping shoulders and knees aligned to refrain from twisting.     Vision         Perception         Praxis         Pertinent Vitals/Pain Pain Assessment Pain Assessment: 0-10 Pain Score: 6  Pain Location: back Pain Descriptors / Indicators: Discomfort, Guarding Pain Intervention(s): Limited activity within patient's tolerance, Monitored during session, RN gave pain meds during session     Extremity/Trunk Assessment Upper Extremity Assessment Upper Extremity Assessment: Overall WFL for tasks assessed   Lower Extremity Assessment Lower Extremity Assessment: Overall WFL for tasks assessed   Cervical / Trunk Assessment Cervical / Trunk Assessment: Back Surgery   Communication Communication Communication: No apparent difficulties   Cognition Arousal: Alert Behavior During Therapy: WFL for tasks assessed/performed Cognition: No apparent impairments                               Following commands: Intact       Cueing  General Comments   Cueing Techniques: Verbal cues;Visual cues      Exercises     Shoulder Instructions      Home Living Family/patient expects to be discharged to:: Private residence Living Arrangements: Spouse/significant other Available Help at Discharge: Family;Available 24 hours/day Type of Home: House Home Access: Stairs to enter [employer] of Steps: 2 Entrance Stairs-Rails: None Home Layout: Two level;Bed/bath upstairs Alternate Level Stairs-Number of Steps: 17 steps Alternate Level Stairs-Rails: Left Bathroom Shower/Tub: Walk-in shower;Tub/shower unit   Bathroom Toilet: Handicapped height Bathroom Accessibility: Yes   Home Equipment: Agricultural consultant (2 wheels);Shower seat;Cane - single point;Grab bars -  tub/shower   Additional Comments: pt works in her garden      Prior Functioning/Environment Prior Level of Function : Independent/Modified Independent;Driving             Mobility Comments: ind no AD ADLs Comments: Ind    OT Problem List: Decreased knowledge of precautions;Pain   OT Treatment/Interventions: Self-care/ADL training;Balance training;Therapeutic exercise;Therapeutic activities;DME and/or AE instruction;Patient/family education      OT Goals(Current goals can be found in the care plan section)   Acute Rehab OT Goals Patient Stated Goal: To go home; watch the [employer] baseball game OT Goal Formulation: With patient Time For Goal Achievement: 03/30/23 Potential to Achieve Goals: Good ADL Goals Pt Will Perform Grooming: with modified independence;standing Pt Will Perform Lower Body Bathing: with set-up;sitting/lateral leans;with adaptive equipment Pt Will Perform Lower Body Dressing: with modified independence;sit to/from stand;sitting/lateral leans Pt Will Transfer to Toilet: ambulating;with modified independence Pt Will Perform Toileting - Clothing Manipulation and hygiene: with modified independence;sit to/from stand;sitting/lateral leans Additional ADL Goal #2: Pt will complete light meal prep ADL without verbal cues for back precautions   OT Frequency:  Min 2X/week    Co-evaluation              AM-PAC OT "6 Clicks" Daily Activity     Outcome Measure Help from another person eating meals?: None Help from another person taking care  of personal grooming?: A Little Help from another person toileting, which includes using toliet, bedpan, or urinal?: A Little Help from another person bathing (including washing, rinsing, drying)?: A Little Help from another person to put on and taking off regular upper body clothing?: None Help from another person to put on and taking off regular lower body clothing?: A Little 6 Click Score: 20   End of Session Nurse  Communication: Mobility status  Activity Tolerance: Patient tolerated treatment well Patient left: in bed;with call bell/phone within reach;with family/visitor present  OT Visit Diagnosis: Pain Pain - Right/Left:  (back)                Time: 1645-1730 OT Time Calculation (min): 45 min Charges:  OT General Charges $OT Visit: 1 Visit OT Evaluation $OT Eval Low Complexity: 1 Low OT Treatments $Self Care/Home Management : 8-22 mins $Therapeutic Activity: 8-22 mins  03/16/2023  AB, OTR/L  Acute Rehabilitation Services  Office: [phone]   Tristan Schroeder 03/16/2023, 5:59 PM

## 2023-03-16 NOTE — Plan of Care (Signed)
 Problem: Education: Goal: Knowledge of General Education information will improve Description: Including pain rating scale, medication(s)/side effects and non-pharmacologic comfort measures 03/16/2023 1712 by Vincente Liberty, RN Outcome: Completed/Met 03/16/2023 1631 by Vincente Liberty, RN Outcome: Progressing   Problem: Health Behavior/Discharge Planning: Goal: Ability to manage health-related needs will improve 03/16/2023 1712 by Vincente Liberty, RN Outcome: Completed/Met 03/16/2023 1631 by Vincente Liberty, RN Outcome: Progressing   Problem: Clinical Measurements: Goal: Ability to maintain clinical measurements within normal limits will improve 03/16/2023 1712 by Vincente Liberty, RN Outcome: Completed/Met 03/16/2023 1631 by Vincente Liberty, RN Outcome: Progressing Goal: Will remain free from infection 03/16/2023 1712 by Vincente Liberty, RN Outcome: Completed/Met 03/16/2023 1631 by Vincente Liberty, RN Outcome: Progressing Goal: Diagnostic test results will improve 03/16/2023 1712 by Vincente Liberty, RN Outcome: Completed/Met 03/16/2023 1631 by Vincente Liberty, RN Outcome: Progressing Goal: Respiratory complications will improve 03/16/2023 1712 by Vincente Liberty, RN Outcome: Completed/Met 03/16/2023 1631 by Vincente Liberty, RN Outcome: Progressing Goal: Cardiovascular complication will be avoided 03/16/2023 1712 by Vincente Liberty, RN Outcome: Completed/Met 03/16/2023 1631 by Vincente Liberty, RN Outcome: Progressing   Problem: Activity: Goal: Risk for activity intolerance will decrease 03/16/2023 1712 by Vincente Liberty, RN Outcome: Completed/Met 03/16/2023 1631 by Vincente Liberty, RN Outcome: Progressing   Problem: Nutrition: Goal: Adequate nutrition will be maintained 03/16/2023 1712 by Vincente Liberty, RN Outcome: Completed/Met 03/16/2023 1631 by Vincente Liberty, RN Outcome: Progressing   Problem: Coping: Goal: Level of anxiety will decrease 03/16/2023  1712 by Vincente Liberty, RN Outcome: Completed/Met 03/16/2023 1631 by Vincente Liberty, RN Outcome: Progressing   Problem: Elimination: Goal: Will not experience complications related to bowel motility 03/16/2023 1712 by Vincente Liberty, RN Outcome: Completed/Met 03/16/2023 1631 by Vincente Liberty, RN Outcome: Progressing Goal: Will not experience complications related to urinary retention 03/16/2023 1712 by Vincente Liberty, RN Outcome: Completed/Met 03/16/2023 1631 by Vincente Liberty, RN Outcome: Progressing   Problem: Pain Managment: Goal: General experience of comfort will improve and/or be controlled 03/16/2023 1712 by Vincente Liberty, RN Outcome: Completed/Met 03/16/2023 1631 by Vincente Liberty, RN Outcome: Progressing   Problem: Safety: Goal: Ability to remain free from injury will improve 03/16/2023 1712 by Vincente Liberty, RN Outcome: Completed/Met 03/16/2023 1631 by Vincente Liberty, RN Outcome: Progressing   Problem: Skin Integrity: Goal: Risk for impaired skin integrity will decrease 03/16/2023 1712 by Vincente Liberty, RN Outcome: Completed/Met 03/16/2023 1631 by Vincente Liberty, RN Outcome: Progressing   Problem: Education: Goal: Ability to verbalize activity precautions or restrictions will improve 03/16/2023 1712 by Vincente Liberty, RN Outcome: Completed/Met 03/16/2023 1631 by Vincente Liberty, RN Outcome: Progressing Goal: Knowledge of the prescribed therapeutic regimen will improve 03/16/2023 1712 by Vincente Liberty, RN Outcome: Completed/Met 03/16/2023 1631 by Vincente Liberty, RN Outcome: Progressing Goal: Understanding of discharge needs will improve 03/16/2023 1712 by Vincente Liberty, RN Outcome: Completed/Met 03/16/2023 1631 by Vincente Liberty, RN Outcome: Progressing   Problem: Activity: Goal: Ability to avoid complications of mobility impairment will improve 03/16/2023 1712 by Vincente Liberty, RN Outcome: Completed/Met 03/16/2023 1631 by  Vincente Liberty, RN Outcome: Progressing Goal: Ability to tolerate increased activity will improve 03/16/2023 1712 by Vincente Liberty, RN Outcome: Completed/Met 03/16/2023 1631 by Vincente Liberty, RN Outcome: Progressing Goal: Will remain free from falls 03/16/2023 1712 by Vincente Liberty, RN Outcome: Completed/Met 03/16/2023 1631 by Vincente Liberty, RN Outcome: Progressing   Problem: Bowel/Gastric: Goal: Gastrointestinal status for postoperative course will improve 03/16/2023 1712 by Vincente Liberty, RN Outcome: Completed/Met 03/16/2023 1631 by Vincente Liberty, RN Outcome: Progressing   Problem: Clinical Measurements: Goal: Ability to  maintain clinical measurements within normal limits will improve 03/16/2023 1712 by Vincente Liberty, RN Outcome: Completed/Met 03/16/2023 1631 by Vincente Liberty, RN Outcome: Progressing Goal: Postoperative complications will be avoided or minimized 03/16/2023 1712 by Vincente Liberty, RN Outcome: Completed/Met 03/16/2023 1631 by Vincente Liberty, RN Outcome: Progressing Goal: Diagnostic test results will improve 03/16/2023 1712 by Vincente Liberty, RN Outcome: Completed/Met 03/16/2023 1631 by Vincente Liberty, RN Outcome: Progressing   Problem: Pain Management: Goal: Pain level will decrease 03/16/2023 1712 by Vincente Liberty, RN Outcome: Completed/Met 03/16/2023 1631 by Vincente Liberty, RN Outcome: Progressing   Problem: Skin Integrity: Goal: Will show signs of wound healing 03/16/2023 1712 by Vincente Liberty, RN Outcome: Completed/Met 03/16/2023 1631 by Vincente Liberty, RN Outcome: Progressing   Problem: Health Behavior/Discharge Planning: Goal: Identification of resources available to assist in meeting health care needs will improve 03/16/2023 1712 by Vincente Liberty, RN Outcome: Completed/Met 03/16/2023 1631 by Vincente Liberty, RN Outcome: Progressing   Problem: Bladder/Genitourinary: Goal: Urinary functional status for  postoperative course will improve 03/16/2023 1712 by Vincente Liberty, RN Outcome: Completed/Met 03/16/2023 1631 by Vincente Liberty, RN Outcome: Progressing

## 2023-03-17 NOTE — Anesthesia Postprocedure Evaluation (Signed)
 Anesthesia Post Note  [name]  Procedure(s) Performed: FORAMINOTOMY 1 LEVEL (Left: Back)     Patient location during evaluation: PACU Anesthesia Type: General Level of consciousness: awake Pain management: pain level controlled Vital Signs Assessment: post-procedure vital signs reviewed and stable Respiratory status: spontaneous breathing, nonlabored ventilation and respiratory function stable Cardiovascular status: blood pressure returned to baseline and stable Postop Assessment: no apparent nausea or vomiting Anesthetic complications: no   No notable events documented.  Last Vitals:  Vitals:   03/16/23 1400 03/16/23 1441  BP: 139/66 128/70  Pulse: 70 81  Resp: (!) 7 20  Temp:  (!) 36.4 C  SpO2: 93% 94%    Last Pain:  Vitals:   03/16/23 1758  TempSrc:   PainSc: 4                  Clemma Johnsen P Castin Donaghue

## 2023-03-18 ENCOUNTER — Encounter (HOSPITAL_COMMUNITY): Payer: Self-pay | Admitting: Neurosurgery

## 2023-03-18 ENCOUNTER — Other Ambulatory Visit: Payer: Self-pay

## 2023-03-19 MED FILL — Thrombin For Soln 5000 Unit: CUTANEOUS | Qty: 2 | Status: AC

## 2023-03-21 NOTE — Op Note (Signed)
 03/16/2023  5:21 PM  PATIENT:  [name] Long  83 y.o. female With pain in the left lower extremity which I believe is caused by lateral recess stenosis at L3/4. 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  BLOOD ADMINISTERED:none  CELL SAVER GIVEN:not used  COUNT:per nursing  DRAINS: none   SPECIMEN:  No Specimen  DICTATION: [name] was taken to the operating room, intubated and placed under a general anesthetic without difficulty. She was positioned prone on a Wilson frame with all pressure points padded. Her back was prepped and draped in a sterile manner. I opened the skin with a 10 blade and carried the dissection down to the thoracolumbar fascia. I used both sharp dissection and the monopolar cautery to expose the lamina of L3, and L4. I confirmed my location with an intraoperative xray.  I used the drill, Kerrison punches, and curettes to perform a semihemilaminectomy of L3. I used the punches to remove the ligamentum flavum to expose the thecal sac. I brought the microscope into the operative field and started  the decompression of the spinal canal, thecal sac and left L3, and L4 root(s).  After the laminectomy was completed I inspected the L3, and 4 nerve roots and felt it was well decompressed. I explored rostrally, laterally, medially, and caudally and was satisfied with the decompression. I irrigated the wound, then closed in layers. I approximated the thoracolumbar fascia, subcutaneous, and subcuticular planes with vicryl sutures. I used dermabond for a sterile dressing.   PLAN OF CARE: Admit for overnight observation  PATIENT DISPOSITION:  PACU - hemodynamically stable.    Delay start of Pharmacological VTE agent (>24hrs) due to surgical blood loss or risk of bleeding:  yes

## 2023-03-28 ENCOUNTER — Other Ambulatory Visit: Payer: Self-pay | Admitting: General Practice

## 2023-03-28 DIAGNOSIS — I10 Essential (primary) hypertension: Secondary | ICD-10-CM

## 2023-04-11 DIAGNOSIS — H524 Presbyopia: Secondary | ICD-10-CM | POA: Diagnosis not present

## 2023-04-11 DIAGNOSIS — I10 Essential (primary) hypertension: Secondary | ICD-10-CM | POA: Diagnosis not present

## 2023-04-11 DIAGNOSIS — H2513 Age-related nuclear cataract, bilateral: Secondary | ICD-10-CM | POA: Diagnosis not present

## 2023-04-19 ENCOUNTER — Ambulatory Visit: Payer: PPO | Admitting: Family Medicine

## 2023-05-01 DIAGNOSIS — C641 Malignant neoplasm of right kidney, except renal pelvis: Secondary | ICD-10-CM | POA: Diagnosis not present

## 2023-05-03 ENCOUNTER — Other Ambulatory Visit: Payer: Self-pay | Admitting: Family Medicine

## 2023-05-03 ENCOUNTER — Encounter: Payer: Self-pay | Admitting: Family Medicine

## 2023-05-03 ENCOUNTER — Ambulatory Visit (INDEPENDENT_AMBULATORY_CARE_PROVIDER_SITE_OTHER): Admitting: Family Medicine

## 2023-05-03 VITALS — BP 118/74 | HR 94 | Ht 64.0 in | Wt 188.8 lb

## 2023-05-03 DIAGNOSIS — M8580 Other specified disorders of bone density and structure, unspecified site: Secondary | ICD-10-CM | POA: Diagnosis not present

## 2023-05-03 DIAGNOSIS — I700 Atherosclerosis of aorta: Secondary | ICD-10-CM | POA: Diagnosis not present

## 2023-05-03 DIAGNOSIS — J301 Allergic rhinitis due to pollen: Secondary | ICD-10-CM

## 2023-05-03 DIAGNOSIS — M069 Rheumatoid arthritis, unspecified: Secondary | ICD-10-CM | POA: Diagnosis not present

## 2023-05-03 DIAGNOSIS — Z955 Presence of coronary angioplasty implant and graft: Secondary | ICD-10-CM

## 2023-05-03 DIAGNOSIS — M797 Fibromyalgia: Secondary | ICD-10-CM | POA: Diagnosis not present

## 2023-05-03 DIAGNOSIS — R339 Retention of urine, unspecified: Secondary | ICD-10-CM | POA: Diagnosis not present

## 2023-05-03 DIAGNOSIS — J4530 Mild persistent asthma, uncomplicated: Secondary | ICD-10-CM

## 2023-05-03 DIAGNOSIS — I10 Essential (primary) hypertension: Secondary | ICD-10-CM

## 2023-05-03 DIAGNOSIS — Z951 Presence of aortocoronary bypass graft: Secondary | ICD-10-CM | POA: Diagnosis not present

## 2023-05-03 DIAGNOSIS — I25709 Atherosclerosis of coronary artery bypass graft(s), unspecified, with unspecified angina pectoris: Secondary | ICD-10-CM

## 2023-05-03 DIAGNOSIS — Z96651 Presence of right artificial knee joint: Secondary | ICD-10-CM

## 2023-05-03 DIAGNOSIS — Z23 Encounter for immunization: Secondary | ICD-10-CM | POA: Diagnosis not present

## 2023-05-03 DIAGNOSIS — M5416 Radiculopathy, lumbar region: Secondary | ICD-10-CM

## 2023-05-03 DIAGNOSIS — Z0000 Encounter for general adult medical examination without abnormal findings: Secondary | ICD-10-CM | POA: Diagnosis not present

## 2023-05-03 DIAGNOSIS — Z85528 Personal history of other malignant neoplasm of kidney: Secondary | ICD-10-CM

## 2023-05-03 DIAGNOSIS — E785 Hyperlipidemia, unspecified: Secondary | ICD-10-CM | POA: Diagnosis not present

## 2023-05-03 DIAGNOSIS — Z981 Arthrodesis status: Secondary | ICD-10-CM

## 2023-05-03 LAB — LIPID PANEL

## 2023-05-03 MED ORDER — METOPROLOL TARTRATE 25 MG PO TABS: 25.0000 mg | ORAL_TABLET | Freq: Two times a day (BID) | ORAL | 3 refills | Status: AC

## 2023-05-03 MED ORDER — ALBUTEROL SULFATE HFA 108 (90 BASE) MCG/ACT IN AERS: INHALATION_SPRAY | RESPIRATORY_TRACT | 1 refills | Status: AC

## 2023-05-03 MED ORDER — MONTELUKAST SODIUM 10 MG PO TABS: 10.0000 mg | ORAL_TABLET | Freq: Every day | ORAL | 3 refills | Status: DC

## 2023-05-03 MED ORDER — SILODOSIN 8 MG PO CAPS: 8.0000 mg | ORAL_CAPSULE | Freq: Every day | ORAL | 3 refills | Status: AC

## 2023-05-03 NOTE — Progress Notes (Signed)
 [name] Long is a 83 y.o. female who presents for annual wellness visit and follow-up on chronic medical conditions.  She recently had back surgery and seems to be doing fairly well.  This was done in March.  She also is being followed by Dr. Sherrine Dolly for her renal cell cancer.  She still does have difficulty with laryngospasm and apparently her pulmonologist said to stop taking the breast tree and only use albuterol  on an as-needed basis.  She continues on amlodipine , metoprolol .  She is also using Repatha .  Continues on Singulair  to help with her allergies.  Uses Nexium on an as-needed basis.  Does use melatonin to help with sleep but states that she has a great deal difficulty with sleep.  She follows up regularly with cardiology.  She does have a history of rheumatoid arthritis but presently is on no medication for that.  Immunizations and Health Maintenance Immunization History  Administered Date(s) Administered   Fluad Quad(high Dose 65+) 08/22/2018, 10/13/2019   Influenza, High Dose Seasonal PF 10/18/2013, 12/01/2016, 10/01/2017, 09/09/2021   Influenza,inj,Quad PF,6+ Mos 09/23/2012   Influenza-Unspecified 12/04/2012, 10/07/2013, 11/02/2013, 09/17/2014, 10/17/2014, 10/22/2015, 12/02/2016, 09/29/2020   PFIZER Comirnaty Martina Sledge Top)Covid-19 Tri-Sucrose Vaccine 10/11/2021   PFIZER(Purple Top)SARS-COV-2 Vaccination 02/06/2019, 03/03/2019, 10/13/2019   Pfizer Covid-19 Vaccine Bivalent Booster 29yrs & up 11/08/2020   Pneumococcal Conjugate-13 04/08/2015, 02/17/2016   Pneumococcal Polysaccharide-23 11/19/2002, 09/23/2012, 12/04/2012   Tdap 07/19/2009, 01/13/2020   Zoster Recombinant(Shingrix) 08/09/2016, 11/27/2016   Zoster, Live 04/16/2008, 03/30/2010   Health Maintenance Due  Topic Date Due   COVID-19 Vaccine (6 - 2024-25 season) 09/03/2022    Last Pap smear:n/a Last mammogram: 2024 Last colonoscopy:  Last DEXA: 2024 Dentist: February Ophtho: summer 2025 Exercise: starting back soon    Other doctors caring for patient include: [place].  Dean Every.  [facility] Advanced directives:yes    Depression screen:  See questionnaire below.     05/03/2023    8:15 AM 01/17/2022   10:31 AM 12/16/2020    8:18 AM 12/11/2019    8:32 AM 12/05/2018   12:20 PM  Depression screen PHQ 2/9  Decreased Interest 0 0 0 0 0  Down, Depressed, Hopeless 0 0 0 0 0  PHQ - 2 Score 0 0 0 0 0  Altered sleeping  0     Tired, decreased energy  0     Change in appetite  0     Feeling bad or failure about yourself   0     Trouble concentrating  0     Moving slowly or fidgety/restless  0     Suicidal thoughts  0     PHQ-9 Score  0     Difficult doing work/chores  Not difficult at all       Fall Risk Screen: see questionnaire below.    05/03/2023    8:15 AM 01/26/2023    9:19 AM 01/17/2022   10:31 AM 12/31/2021   10:27 AM 12/16/2021   12:01 PM  Fall Risk   Falls in the past year? 0 0 0 0 0  Number falls in past yr: 0  0 0 0  Injury with Fall? 0  0 0 0  Risk for fall due to : No Fall Risks  Medication side effect    Follow up Falls evaluation completed  Falls prevention discussed;Education provided;Falls evaluation completed      ADL screen:  See questionnaire below Functional Status Survey:     Review of Systems Constitutional: -, -unexpected  weight change, -anorexia, -fatigue Allergy: -sneezing, -itching, -congestion Dermatology: denies changing moles, rash, lumps ENT: -runny nose, -ear pain, -sore throat,  Cardiology:  -chest pain, -palpitations, -orthopnea, Respiratory: -cough, -shortness of breath, -dyspnea on exertion, -wheezing,  Gastroenterology: -abdominal pain, -nausea, -vomiting, -diarrhea, -constipation, -dysphagia Hematology: -bleeding or bruising problems Musculoskeletal: -arthralgias, -myalgias, -joint swelling, -back pain, - Ophthalmology: -vision changes,  Urology: -dysuria, -difficulty urinating,  -urinary frequency, -urgency, incontinence Neurology: -, -numbness, ,  -memory loss, -falls, -dizziness    PHYSICAL EXAM:   General Appearance: Alert, cooperative, no distress, appears stated age Head: Normocephalic, without obvious abnormality, atraumatic Eyes: PERRL, conjunctiva/corneas clear, EOM's intact,  Ears: Normal TM's and external ear canals Nose: Nares normal, mucosa normal, no drainage or sinus tenderness Throat: Lips, mucosa, and tongue normal; teeth and gums normal Neck: Supple, no lymphadenopathy;  thyroid :  no enlargement/tenderness/nodules; no carotid bruit or JVD Lungs: Clear to auscultation bilaterally without wheezes, rales or ronchi; respirations unlabored Heart: Regular rate and rhythm, S1 and S2 normal, no murmur, rubor gallop Abdomen: Soft, non-tender, nondistended, normoactive bowel sounds,  no masses, no hepatosplenomegaly Extremities: No clubbing, cyanosis or edema Pulses: 2+ and symmetric all extremities Skin:  Skin color, texture, turgor normal, no rashes or lesions Lymph nodes: Cervical, supraclavicular, and axillary nodes normal Neurologic:  CNII-XII intact, normal strength, sensation and gait; reflexes 2+ and symmetric throughout Psych: Normal mood, affect, hygiene and grooming.  ASSESSMENT/PLAN: Routine general medical examination at a health care facility  S/P TKR (total knee replacement), right  S/P cervical spinal fusion  S/P CABG x 3: (lima-lad,svg-om,svg-rca) - Plan: CBC with Differential/Platelet, Comprehensive metabolic panel with GFR  Rheumatoid arthritis of other site, unspecified whether rheumatoid factor present (HCC)  Osteopenia, unspecified location - Plan: DG Bone Density  Lumbar radiculopathy, right  Presence of drug coated stent in right coronary artery  Hyperlipidemia with target LDL less than 70 - Plan: Lipid panel  Fibromyalgia  Essential hypertension - Plan: metoprolol  tartrate (LOPRESSOR ) 25 MG tablet  Asthma, chronic, mild persistent, uncomplicated - Plan: albuterol  (PROVENTIL  HFA)  108 (90 Base) MCG/ACT inhaler, montelukast  (SINGULAIR ) 10 MG tablet  Seasonal allergic rhinitis due to pollen  Aortic atherosclerosis (HCC)  Need for vaccination against Streptococcus pneumoniae - Plan: Pneumococcal conjugate vaccine 20-valent (Prevnar 20)  Urinary retention - Plan: silodosin  (RAPAFLO ) 8 MG CAPS capsule  Coronary artery disease involving coronary bypass graft of native heart with angina pectoris (HCC) - Plan: metoprolol  tartrate (LOPRESSOR ) 25 MG tablet      Immunization recommendations discussed.  Colonoscopy recommendations reviewed   Medicare Attestation I have personally reviewed: The patient's medical and social history Their use of alcohol, tobacco or illicit drugs Their current medications and supplements The patient's functional ability including ADLs,fall risks, home safety risks, cognitive, and hearing and visual impairment Diet and physical activities Evidence for depression or mood disorders  The patient's weight, height, and BMI have been recorded in the chart.  I have made referrals, counseling, and provided education to the patient based on review of the above and I have provided the patient with a written personalized care plan for preventive services.     Ron Cobbs, MD   05/03/2023

## 2023-05-03 NOTE — Progress Notes (Signed)
 Complete physical exam  [name]   DOB: 07/21/1940   83 y.o. Female  MRN: [id number]  Subjective:    Chief Complaint  Patient presents with   Annual Exam    AWV. CPE. Fasting.     [name] Long is a 83 y.o. female who presents today for a complete physical exam.  She reports consuming a general diet. The patient does not participate in regular exercise at present. She generally feels fairly well. She reports sleeping poorly.  Most recent fall risk assessment:    01/26/2023    9:19 AM  Fall Risk   Falls in the past year? 0     Most recent depression screenings:    01/17/2022   10:31 AM 12/16/2020    8:18 AM  PHQ 2/9 Scores  PHQ - 2 Score 0 0  PHQ- 9 Score 0     Vision:April 2025 and Dental: No current dental problems and Last dental visit: February 2025    Immunization History  Administered Date(s) Administered   Fluad Quad(high Dose 65+) 08/22/2018, 10/13/2019   Influenza, High Dose Seasonal PF 10/18/2013, 12/01/2016, 10/01/2017, 09/09/2021   Influenza,inj,Quad PF,6+ Mos 09/23/2012   Influenza-Unspecified 12/04/2012, 10/07/2013, 11/02/2013, 09/17/2014, 10/17/2014, 10/22/2015, 12/02/2016, 09/29/2020   PFIZER Comirnaty (Gray Top)Covid-19 Tri-Sucrose Vaccine 10/11/2021   PFIZER(Purple Top)SARS-COV-2 Vaccination 02/06/2019, 03/03/2019, 10/13/2019   Pfizer Covid-19 Vaccine Bivalent Booster 39yrs & up 11/08/2020   Pneumococcal Conjugate-13 04/08/2015, 02/17/2016   Pneumococcal Polysaccharide-23 11/19/2002, 09/23/2012, 12/04/2012   Tdap 07/19/2009, 01/13/2020   Zoster Recombinant(Shingrix) 08/09/2016, 11/27/2016   Zoster, Live 04/16/2008, 03/30/2010    Health Maintenance  Topic Date Due   COVID-19 Vaccine (6 - 2024-25 season) 09/03/2022   INFLUENZA VACCINE  08/03/2023   Medicare Annual Wellness (AWV)  05/02/2024   DTaP/Tdap/Td (3 - Td or Tdap) 01/12/2030   Pneumonia Vaccine 28+ Years old  Completed   DEXA SCAN  Completed   Zoster Vaccines- Shingrix   Completed   HPV VACCINES  Aged Out   Meningococcal B Vaccine  Aged Out   Colonoscopy  Discontinued   Hepatitis C Screening  Discontinued    Patient Care Team: Watson Hacking, MD as PCP - General (Family Medicine) Avanell Leigh, MD as PCP - Cardiology (Cardiology) Lamond Pilot, PA as Physician Assistant (Cardiology)   Outpatient Medications Prior to Visit  Medication Sig   acetaminophen  (TYLENOL ) 500 MG tablet Take 500 mg by mouth every 6 (six) hours as needed for moderate pain.   albuterol  (PROVENTIL  HFA) 108 (90 Base) MCG/ACT inhaler INHALE 2 PUFF AS DIRECTED FOUR TIMES A DAY AS NEEDED   amLODipine  (NORVASC ) 5 MG tablet TAKE 1 TABLET BY MOUTH DAILY   aspirin  EC 81 MG tablet Take 81 mg by mouth daily. Swallow whole.   atorvastatin  (LIPITOR ) 20 MG tablet Take 1 tablet (20 mg total) by mouth daily.   Calcium  Carb-Cholecalciferol  (CALTRATE 600+D3 PO) Take 1 tablet by mouth daily.   carisoprodol  (SOMA ) 350 MG tablet Take 350 mg by mouth every 8 (eight) hours as needed (for arthritis pain).   Cholecalciferol  (VITAMIN D ) 50 MCG (2000 UT) tablet Take 2,000 Units by mouth daily.   esomeprazole (NEXIUM) 20 MG capsule Take 20 mg by mouth in the morning and at bedtime.   Evolocumab  (REPATHA  SURECLICK) 140 MG/ML SOAJ INJECT 140 MG INTO THE SKIN EVERY 14 DAYS   fluticasone  (FLONASE ) 50 MCG/ACT nasal spray Place 1 spray into both nostrils daily.   gabapentin  (NEURONTIN ) 600 MG tablet Take  1,200-1,800 mg by mouth See admin instructions. Take 2 tablets (1200 mg) by mouth in the morning & take 3 tablets (1800 mg) by mouth at night   HYDROmorphone  (DILAUDID ) 4 MG tablet Take 1 tablet (4 mg total) by mouth every 4 (four) hours as needed for severe pain.   magnesium  oxide (MAG-OX) 400 MG tablet Take 400 mg by mouth at bedtime.   Melatonin 10 MG CAPS Take 10 mg by mouth at bedtime as needed (sleep).   metoprolol  tartrate (LOPRESSOR ) 25 MG tablet TAKE 1 TABLET BY MOUTH TWICE A DAY   montelukast   (SINGULAIR ) 10 MG tablet TAKE 1 TABLET BY MOUTH DAILY   Multiple Vitamins-Minerals (PRESERVISION AREDS 2 PO) Take 1 tablet by mouth in the morning and at bedtime.   nitroGLYCERIN  (NITROSTAT ) 0.4 MG SL tablet Place 1 tablet (0.4 mg total) under the tongue every 5 (five) minutes x 3 doses as needed for chest pain.   Polyethyl Glycol-Propyl Glycol (SYSTANE OP) Place 1 drop into both eyes 2 (two) times daily.   silodosin  (RAPAFLO ) 8 MG CAPS capsule Take 1 capsule (8 mg total) by mouth daily.   No facility-administered medications prior to visit.    ROS  Family and social history as well as health maintenance and immunizations was reviewed.     Objective:    There were no vitals taken for this visit.   Physical Exam   No results found for any visits on 05/03/23.     Assessment & Plan:    Discussed health benefits of physical activity, and encouraged her to engage in regular exercise appropriate for her age and condition.  Routine general medical examination at a health care facility  S/P TKR (total knee replacement), right  S/P cervical spinal fusion  S/P CABG x 3: (lima-lad,svg-om,svg-rca)  Rheumatoid arthritis of other site, unspecified whether rheumatoid factor present (HCC)  Osteopenia, unspecified location  Lumbar radiculopathy, right  Presence of drug coated stent in right coronary artery  Hyperlipidemia with target LDL less than 70  Fibromyalgia  Essential hypertension  Asthma, chronic, mild persistent, uncomplicated  Seasonal allergic rhinitis due to pollen  Aortic atherosclerosis (HCC)  Need for vaccination against Streptococcus pneumoniae  Urinary retention  Coronary artery disease involving coronary bypass graft of native heart with angina pectoris (HCC)   No follow-ups on file.      Eddye Goodie, CMA

## 2023-05-04 LAB — CBC WITH DIFFERENTIAL/PLATELET
Basophils Absolute: 0 10*3/uL (ref 0.0–0.2)
Basos: 1 %
EOS (ABSOLUTE): 0.2 10*3/uL (ref 0.0–0.4)
Eos: 3 %
Hematocrit: 40.6 % (ref 34.0–46.6)
Hemoglobin: 13.1 g/dL (ref 11.1–15.9)
Immature Grans (Abs): 0 10*3/uL (ref 0.0–0.1)
Immature Granulocytes: 0 %
Lymphocytes Absolute: 1.6 10*3/uL (ref 0.7–3.1)
Lymphs: 23 %
MCH: 29.2 pg (ref 26.6–33.0)
MCHC: 32.3 g/dL (ref 31.5–35.7)
MCV: 90 fL (ref 79–97)
Monocytes Absolute: 0.5 10*3/uL (ref 0.1–0.9)
Monocytes: 7 %
Neutrophils Absolute: 4.6 10*3/uL (ref 1.4–7.0)
Neutrophils: 66 %
Platelets: 226 10*3/uL (ref 150–450)
RBC: 4.49 x10E6/uL (ref 3.77–5.28)
RDW: 13.7 % (ref 11.7–15.4)
WBC: 7 10*3/uL (ref 3.4–10.8)

## 2023-05-04 LAB — COMPREHENSIVE METABOLIC PANEL WITH GFR
ALT: 20 IU/L (ref 0–32)
AST: 19 IU/L (ref 0–40)
Albumin: 4.6 g/dL (ref 3.7–4.7)
Alkaline Phosphatase: 135 IU/L — ABNORMAL HIGH (ref 44–121)
BUN/Creatinine Ratio: 22 (ref 12–28)
BUN: 22 mg/dL (ref 8–27)
Bilirubin Total: 0.5 mg/dL (ref 0.0–1.2)
CO2: 22 mmol/L (ref 20–29)
Calcium: 9.2 mg/dL (ref 8.7–10.3)
Chloride: 104 mmol/L (ref 96–106)
Creatinine, Ser: 1.01 mg/dL — ABNORMAL HIGH (ref 0.57–1.00)
Globulin, Total: 2.6 g/dL (ref 1.5–4.5)
Glucose: 115 mg/dL — ABNORMAL HIGH (ref 70–99)
Potassium: 4.3 mmol/L (ref 3.5–5.2)
Sodium: 143 mmol/L (ref 134–144)
Total Protein: 7.2 g/dL (ref 6.0–8.5)
eGFR: 56 mL/min/{1.73_m2} — ABNORMAL LOW (ref >59)

## 2023-05-04 LAB — LIPID PANEL
Chol/HDL Ratio: 1.5 ratio (ref 0.0–4.4)
Cholesterol, Total: 123 mg/dL (ref 100–199)
HDL: 81 mg/dL (ref >39)
LDL Chol Calc (NIH): 28 mg/dL (ref 0–99)
Triglycerides: 70 mg/dL (ref 0–149)
VLDL Cholesterol Cal: 14 mg/dL (ref 5–40)

## 2023-05-06 ENCOUNTER — Encounter: Payer: Self-pay | Admitting: Family Medicine

## 2023-05-08 DIAGNOSIS — Z905 Acquired absence of kidney: Secondary | ICD-10-CM | POA: Diagnosis not present

## 2023-05-08 DIAGNOSIS — J9811 Atelectasis: Secondary | ICD-10-CM | POA: Diagnosis not present

## 2023-05-08 DIAGNOSIS — N289 Disorder of kidney and ureter, unspecified: Secondary | ICD-10-CM | POA: Diagnosis not present

## 2023-05-08 DIAGNOSIS — I700 Atherosclerosis of aorta: Secondary | ICD-10-CM | POA: Diagnosis not present

## 2023-05-08 DIAGNOSIS — C641 Malignant neoplasm of right kidney, except renal pelvis: Secondary | ICD-10-CM | POA: Diagnosis not present

## 2023-05-21 DIAGNOSIS — H353131 Nonexudative age-related macular degeneration, bilateral, early dry stage: Secondary | ICD-10-CM | POA: Diagnosis not present

## 2023-05-21 DIAGNOSIS — H532 Diplopia: Secondary | ICD-10-CM | POA: Diagnosis not present

## 2023-05-21 DIAGNOSIS — H1011 Acute atopic conjunctivitis, right eye: Secondary | ICD-10-CM | POA: Diagnosis not present

## 2023-05-21 DIAGNOSIS — H2511 Age-related nuclear cataract, right eye: Secondary | ICD-10-CM | POA: Diagnosis not present

## 2023-05-30 DIAGNOSIS — M47812 Spondylosis without myelopathy or radiculopathy, cervical region: Secondary | ICD-10-CM | POA: Diagnosis not present

## 2023-05-30 DIAGNOSIS — Z79891 Long term (current) use of opiate analgesic: Secondary | ICD-10-CM | POA: Diagnosis not present

## 2023-05-30 DIAGNOSIS — G894 Chronic pain syndrome: Secondary | ICD-10-CM | POA: Diagnosis not present

## 2023-05-30 DIAGNOSIS — M150 Primary generalized (osteo)arthritis: Secondary | ICD-10-CM | POA: Diagnosis not present

## 2023-06-03 ENCOUNTER — Other Ambulatory Visit: Payer: Self-pay | Admitting: Family Medicine

## 2023-06-03 DIAGNOSIS — J4530 Mild persistent asthma, uncomplicated: Secondary | ICD-10-CM

## 2023-06-05 DIAGNOSIS — H25811 Combined forms of age-related cataract, right eye: Secondary | ICD-10-CM | POA: Diagnosis not present

## 2023-06-05 DIAGNOSIS — H52221 Regular astigmatism, right eye: Secondary | ICD-10-CM | POA: Diagnosis not present

## 2023-06-14 DIAGNOSIS — H2512 Age-related nuclear cataract, left eye: Secondary | ICD-10-CM | POA: Diagnosis not present

## 2023-06-19 DIAGNOSIS — H25812 Combined forms of age-related cataract, left eye: Secondary | ICD-10-CM | POA: Diagnosis not present

## 2023-06-19 DIAGNOSIS — H52222 Regular astigmatism, left eye: Secondary | ICD-10-CM | POA: Diagnosis not present

## 2023-06-28 ENCOUNTER — Other Ambulatory Visit: Payer: Self-pay | Admitting: General Practice

## 2023-06-28 DIAGNOSIS — Z951 Presence of aortocoronary bypass graft: Secondary | ICD-10-CM

## 2023-06-28 DIAGNOSIS — E785 Hyperlipidemia, unspecified: Secondary | ICD-10-CM

## 2023-07-02 ENCOUNTER — Other Ambulatory Visit: Payer: Self-pay | Admitting: *Deleted

## 2023-07-02 DIAGNOSIS — E785 Hyperlipidemia, unspecified: Secondary | ICD-10-CM

## 2023-07-02 DIAGNOSIS — Z951 Presence of aortocoronary bypass graft: Secondary | ICD-10-CM

## 2023-07-02 MED ORDER — ATORVASTATIN CALCIUM 20 MG PO TABS: 20.0000 mg | ORAL_TABLET | Freq: Every day | ORAL | 2 refills | Status: DC

## 2023-07-05 DIAGNOSIS — C641 Malignant neoplasm of right kidney, except renal pelvis: Secondary | ICD-10-CM | POA: Diagnosis not present

## 2023-07-05 DIAGNOSIS — N312 Flaccid neuropathic bladder, not elsewhere classified: Secondary | ICD-10-CM | POA: Diagnosis not present

## 2023-07-13 ENCOUNTER — Other Ambulatory Visit: Payer: Self-pay | Admitting: General Practice

## 2023-07-13 DIAGNOSIS — Z951 Presence of aortocoronary bypass graft: Secondary | ICD-10-CM

## 2023-07-13 DIAGNOSIS — E785 Hyperlipidemia, unspecified: Secondary | ICD-10-CM

## 2023-08-21 ENCOUNTER — Other Ambulatory Visit: Payer: Self-pay

## 2023-08-21 ENCOUNTER — Emergency Department (HOSPITAL_COMMUNITY)

## 2023-08-21 ENCOUNTER — Encounter (HOSPITAL_COMMUNITY): Payer: Self-pay

## 2023-08-21 ENCOUNTER — Emergency Department (HOSPITAL_BASED_OUTPATIENT_CLINIC_OR_DEPARTMENT_OTHER)

## 2023-08-21 ENCOUNTER — Telehealth: Payer: Self-pay | Admitting: Cardiovascular Disease

## 2023-08-21 ENCOUNTER — Emergency Department (HOSPITAL_BASED_OUTPATIENT_CLINIC_OR_DEPARTMENT_OTHER)
Admission: EM | Admit: 2023-08-21 | Discharge: 2023-08-21 | Disposition: A | Discharge status: Home / Self Care | Source: Ambulatory Visit | Attending: Emergency Medicine | Admitting: Emergency Medicine

## 2023-08-21 DIAGNOSIS — I10 Essential (primary) hypertension: Secondary | ICD-10-CM | POA: Insufficient documentation

## 2023-08-21 DIAGNOSIS — Z96643 Presence of artificial hip joint, bilateral: Secondary | ICD-10-CM | POA: Diagnosis not present

## 2023-08-21 DIAGNOSIS — Z955 Presence of coronary angioplasty implant and graft: Secondary | ICD-10-CM | POA: Insufficient documentation

## 2023-08-21 DIAGNOSIS — M79604 Pain in right leg: Secondary | ICD-10-CM | POA: Insufficient documentation

## 2023-08-21 DIAGNOSIS — I2510 Atherosclerotic heart disease of native coronary artery without angina pectoris: Secondary | ICD-10-CM | POA: Diagnosis not present

## 2023-08-21 DIAGNOSIS — Z85528 Personal history of other malignant neoplasm of kidney: Secondary | ICD-10-CM | POA: Insufficient documentation

## 2023-08-21 DIAGNOSIS — Z7982 Long term (current) use of aspirin: Secondary | ICD-10-CM | POA: Diagnosis not present

## 2023-08-21 DIAGNOSIS — M79605 Pain in left leg: Secondary | ICD-10-CM | POA: Diagnosis not present

## 2023-08-21 DIAGNOSIS — M4807 Spinal stenosis, lumbosacral region: Secondary | ICD-10-CM | POA: Diagnosis not present

## 2023-08-21 DIAGNOSIS — M48061 Spinal stenosis, lumbar region without neurogenic claudication: Secondary | ICD-10-CM | POA: Diagnosis not present

## 2023-08-21 DIAGNOSIS — J45909 Unspecified asthma, uncomplicated: Secondary | ICD-10-CM | POA: Insufficient documentation

## 2023-08-21 DIAGNOSIS — R609 Edema, unspecified: Secondary | ICD-10-CM | POA: Diagnosis not present

## 2023-08-21 DIAGNOSIS — M47816 Spondylosis without myelopathy or radiculopathy, lumbar region: Secondary | ICD-10-CM | POA: Diagnosis not present

## 2023-08-21 DIAGNOSIS — Z96653 Presence of artificial knee joint, bilateral: Secondary | ICD-10-CM | POA: Insufficient documentation

## 2023-08-21 DIAGNOSIS — M545 Low back pain, unspecified: Secondary | ICD-10-CM | POA: Diagnosis not present

## 2023-08-21 DIAGNOSIS — G959 Disease of spinal cord, unspecified: Secondary | ICD-10-CM | POA: Diagnosis not present

## 2023-08-21 DIAGNOSIS — N3000 Acute cystitis without hematuria: Secondary | ICD-10-CM | POA: Diagnosis not present

## 2023-08-21 DIAGNOSIS — Z79899 Other long term (current) drug therapy: Secondary | ICD-10-CM | POA: Insufficient documentation

## 2023-08-21 LAB — CBC WITH DIFFERENTIAL/PLATELET
Abs Immature Granulocytes: 0.02 K/uL (ref 0.00–0.07)
Basophils Absolute: 0 K/uL (ref 0.0–0.1)
Basophils Relative: 0 %
Eosinophils Absolute: 0.2 K/uL (ref 0.0–0.5)
Eosinophils Relative: 3 %
HCT: 39.3 % (ref 36.0–46.0)
Hemoglobin: 12.3 g/dL (ref 12.0–15.0)
Immature Granulocytes: 0 %
Lymphocytes Relative: 27 %
Lymphs Abs: 1.9 K/uL (ref 0.7–4.0)
MCH: 28.6 pg (ref 26.0–34.0)
MCHC: 31.3 g/dL (ref 30.0–36.0)
MCV: 91.4 fL (ref 80.0–100.0)
Monocytes Absolute: 0.6 K/uL (ref 0.1–1.0)
Monocytes Relative: 8 %
Neutro Abs: 4.3 K/uL (ref 1.7–7.7)
Neutrophils Relative %: 62 %
Platelets: 158 K/uL (ref 150–400)
RBC: 4.3 MIL/uL (ref 3.87–5.11)
RDW: 14.4 % (ref 11.5–15.5)
WBC: 7.1 K/uL (ref 4.0–10.5)
nRBC: 0 % (ref 0.0–0.2)

## 2023-08-21 LAB — COMPREHENSIVE METABOLIC PANEL WITH GFR
ALT: 16 U/L (ref 0–44)
AST: 22 U/L (ref 15–41)
Albumin: 4.4 g/dL (ref 3.5–5.0)
Alkaline Phosphatase: 138 U/L — ABNORMAL HIGH (ref 38–126)
Anion gap: 11 (ref 5–15)
BUN: 20 mg/dL (ref 8–23)
CO2: 28 mmol/L (ref 22–32)
Calcium: 9.3 mg/dL (ref 8.9–10.3)
Chloride: 104 mmol/L (ref 98–111)
Creatinine, Ser: 1.02 mg/dL — ABNORMAL HIGH (ref 0.44–1.00)
GFR, Estimated: 55 mL/min — ABNORMAL LOW (ref >60)
Glucose, Bld: 123 mg/dL — ABNORMAL HIGH (ref 70–99)
Potassium: 4.8 mmol/L (ref 3.5–5.1)
Sodium: 143 mmol/L (ref 135–145)
Total Bilirubin: 0.4 mg/dL (ref 0.0–1.2)
Total Protein: 7.3 g/dL (ref 6.5–8.1)

## 2023-08-21 LAB — URINALYSIS, ROUTINE W REFLEX MICROSCOPIC
Bilirubin Urine: NEGATIVE
Glucose, UA: NEGATIVE mg/dL
Hgb urine dipstick: NEGATIVE
Ketones, ur: NEGATIVE mg/dL
Nitrite: POSITIVE — AB
Specific Gravity, Urine: 1.022 (ref 1.005–1.030)
WBC, UA: >50 WBC/hpf (ref 0–5)
pH: 5.5 (ref 5.0–8.0)

## 2023-08-21 LAB — MAGNESIUM: Magnesium: 2.1 mg/dL (ref 1.7–2.4)

## 2023-08-21 LAB — CK: Total CK: 79 U/L (ref 38–234)

## 2023-08-21 LAB — PHOSPHORUS: Phosphorus: 3.6 mg/dL (ref 2.5–4.6)

## 2023-08-21 MED ORDER — HYDROMORPHONE HCL 1 MG/ML IJ SOLN
1.0000 mg | Freq: Once | INTRAMUSCULAR | Status: AC
  Administered 2023-08-21: 1 mg via INTRAVENOUS
  Filled 2023-08-21: qty 1

## 2023-08-21 MED ORDER — GADOBUTROL 1 MMOL/ML IV SOLN
8.5000 mL | Freq: Once | INTRAVENOUS | Status: AC | PRN
  Administered 2023-08-21: 8.5 mL via INTRAVENOUS

## 2023-08-21 MED ORDER — METHYLPREDNISOLONE 4 MG PO TBPK: ORAL_TABLET | Freq: Every day | ORAL | 0 refills | Status: AC

## 2023-08-21 MED ORDER — CEFPODOXIME PROXETIL 200 MG PO TABS: 200.0000 mg | ORAL_TABLET | Freq: Two times a day (BID) | ORAL | 0 refills | Status: AC

## 2023-08-21 MED ORDER — GADOBUTROL 1 MMOL/ML IV SOLN: 8.0000 mL | Freq: Once | INTRAVENOUS | Status: DC | PRN

## 2023-08-21 MED ORDER — SODIUM CHLORIDE 0.9 % IV BOLUS
1000.0000 mL | Freq: Once | INTRAVENOUS | Status: AC
  Administered 2023-08-21: 1000 mL via INTRAVENOUS

## 2023-08-21 NOTE — ED Notes (Signed)
 Patient returned from MRI to [facility]

## 2023-08-21 NOTE — Discharge Instructions (Addendum)
 You were seen in the ER today for evaluation of your leg pain. I think this is related to your back. You will need to follow up with Dr. Shelagh office. Please make sure you call tomorrow.  I am prescribing you a steroid to help with inflammation in your back.  Additionally, I am prescribing you an antibiotic to take for UTI symptoms.  You can continue taking your Dilaudid  as prescribed.  I have included additional information for you to review in the discharge paperwork.  If you have any concerns, new or worsening symptoms, please return your nearest emergency department for reevaluation.  Contact a health care provider if: Your leg cramps get worse or happen more often. Your leg cramps don't get better over time. Your foot becomes cold, numb, or blue.

## 2023-08-21 NOTE — Telephone Encounter (Signed)
 Spoke with pt regarding her leg pain. Pt stated that she is having constant horrendous bilateral leg pain that came on suddenly 2 days ago. Pt stated her legs feel tight but there does not appear to be any swelling. Pt stated her legs are painful to touch and it feels like her legs are cramping. Pt denies any redness. Pt has prescription for dilaudid  but states that it has not helped. Pt stated she iced her legs overnight and this helped a bit. Pt stated she is having trouble walking due to the pain. Pt denies any shortness of breath or chest pain but did complain of dizziness when she leans over or stands up. Pt took her blood pressure while on the phone and it was 107/76. Pt was told she should go to the Emergency Room to be evaluated. Pt verbalized understanding. All questions if any were answered.

## 2023-08-21 NOTE — ED Provider Notes (Signed)
 Physical Exam  BP 123/63 (BP Location: Left Arm)   Pulse 73   Temp 98.3 F (36.8 C) (Oral)   Resp 16   Ht 5' 4 (1.626 m)   Wt 84.8 kg   SpO2 99%   BMI 32.10 kg/m   Physical Exam Vitals and nursing note reviewed.  Constitutional:      General: She is not in acute distress.    Appearance: She is not toxic-appearing.  HENT:     Head: Normocephalic and atraumatic.     Mouth/Throat:     Mouth: Mucous membranes are moist.  Eyes:     General: No scleral icterus.    Extraocular Movements: Extraocular movements intact.  Cardiovascular:     Rate and Rhythm: Normal rate.     Pulses:          Dorsalis pedis pulses are 2+ on the right side and 2+ on the left side.       Posterior tibial pulses are 2+ on the right side and 2+ on the left side.  Pulmonary:     Effort: Pulmonary effort is normal. No respiratory distress.  Abdominal:     Palpations: Abdomen is soft.     Tenderness: There is no abdominal tenderness.  Musculoskeletal:     Cervical back: Normal range of motion.     Right lower leg: No edema.     Left lower leg: No edema.     Comments: Coloration, temperature, size symmetric in appearance and palpation.  Diffuse tenderness however compartments are soft. Palpable DP and PT pulses.  Strength intact however causes pain.  Brisk cap refill.  Skin:    General: Skin is warm and dry.  Neurological:     General: No focal deficit present.     Mental Status: She is alert.     GCS: GCS eye subscore is 4. GCS verbal subscore is 5. GCS motor subscore is 6.     Sensory: Sensation is intact.     Motor: No weakness.     Gait: Gait is intact.     Procedures  Procedures  ED Course / MDM    Medical Decision Making Amount and/or Complexity of Data Reviewed Labs: ordered. Radiology: ordered.  Risk Prescription drug management.   Patient was transferred from [facility] for MRI with concern for bilateral leg pain. She is NV intact distally.  Outpatient ultrasound was negative for  bilateral DVTs.  Will order MRI with and without of the lumbar spine.  MRI lumbar without contrast 1. Transitional anatomy at the lumbosacral junction. 2. Facet effusion on the left at L3-4 with edema in the adjacent paraspinal musculature which may contribute to back pain. Superimposed septic facet arthrosis cannot be excluded. 3. Mild spinal canal stenosis at L2-3 is slightly increased from prior. Additional mild spinal canal stenosis at L3-4 is similar. 4. Multilevel foraminal stenosis, greatest and moderate-to-severe on the right at L5-S1. 5. Moderate foraminal stenosis on the left at L2-3 and on the right at L3-4, similar to prior. Per radiologist's interpretation.    MRI of the lumbar spine with contrast shows  Postcontrast only imaging was performed to further evaluate findings on MRI lumbar spine from earlier today. Left L3-L4 perifacet enhancement at the site of a prior intervention, correlating with edema seen earlier. Findings could be in part degenerative and/or postoperative in etiology, but infection is not excluded. No discrete, drainable fluid collection. Per radiologist's interpretation.    My attending has personally evaluated this patient and does not  think this is infectious. Given lack of fever and other risk factors. Likely neurogenic claudication as the patient has palpable distal pulses. Will recommend that she follow up with neurosurgery, Dr. Gillie. Will order Medrol  dose pack for the patient and also cefpodoxime  for the patient's urine as it does appear consistent with infection.   The patient has been independently ambulatory without any assistance.  Has normal gait.  Recommend that she continue taking her Dilaudid  outpatient in addition to the Medrol  Dosepak and antibiotics for UTI.  We discussed strict return precautions I recommend symptoms.  Patient and family verbalized understanding agreed to the plan.  Patient is stable being discharged home in good condition.  I  discussed this case with my attending physician who cosigned this note including patient's presenting symptoms, physical exam, and planned diagnostics and interventions. Attending physician stated agreement with plan or made changes to plan which were implemented.   Attending physician assessed patient at bedside.  Results for orders placed or performed during the hospital encounter of 08/21/23  Comprehensive metabolic panel   Collection Time: 08/21/23  1:16 PM  Result Value Ref Range   Sodium 143 135 - 145 mmol/L   Potassium 4.8 3.5 - 5.1 mmol/L   Chloride 104 98 - 111 mmol/L   CO2 28 22 - 32 mmol/L   Glucose, Bld 123 (H) 70 - 99 mg/dL   BUN 20 8 - 23 mg/dL   Creatinine, Ser 8.97 (H) 0.44 - 1.00 mg/dL   Calcium  9.3 8.9 - 10.3 mg/dL   Total Protein 7.3 6.5 - 8.1 g/dL   Albumin  4.4 3.5 - 5.0 g/dL   AST 22 15 - 41 U/L   ALT 16 0 - 44 U/L   Alkaline Phosphatase 138 (H) 38 - 126 U/L   Total Bilirubin 0.4 0.0 - 1.2 mg/dL   GFR, Estimated 55 (L) >60 mL/min   Anion gap 11 5 - 15  CBC with Differential   Collection Time: 08/21/23  1:16 PM  Result Value Ref Range   WBC 7.1 4.0 - 10.5 K/uL   RBC 4.30 3.87 - 5.11 MIL/uL   Hemoglobin 12.3 12.0 - 15.0 g/dL   HCT 60.6 63.9 - 53.9 %   MCV 91.4 80.0 - 100.0 fL   MCH 28.6 26.0 - 34.0 pg   MCHC 31.3 30.0 - 36.0 g/dL   RDW 85.5 88.4 - 84.4 %   Platelets 158 150 - 400 K/uL   nRBC 0.0 0.0 - 0.2 %   Neutrophils Relative % 62 %   Neutro Abs 4.3 1.7 - 7.7 K/uL   Lymphocytes Relative 27 %   Lymphs Abs 1.9 0.7 - 4.0 K/uL   Monocytes Relative 8 %   Monocytes Absolute 0.6 0.1 - 1.0 K/uL   Eosinophils Relative 3 %   Eosinophils Absolute 0.2 0.0 - 0.5 K/uL   Basophils Relative 0 %   Basophils Absolute 0.0 0.0 - 0.1 K/uL   Immature Granulocytes 0 %   Abs Immature Granulocytes 0.02 0.00 - 0.07 K/uL  CK   Collection Time: 08/21/23  1:16 PM  Result Value Ref Range   Total CK 79 38 - 234 U/L  Urinalysis, Routine w reflex microscopic -Urine, Clean  Catch   Collection Time: 08/21/23  1:59 PM  Result Value Ref Range   Color, Urine YELLOW YELLOW   APPearance HAZY (A) CLEAR   Specific Gravity, Urine 1.022 1.005 - 1.030   pH 5.5 5.0 - 8.0   Glucose, UA NEGATIVE NEGATIVE  mg/dL   Hgb urine dipstick NEGATIVE NEGATIVE   Bilirubin Urine NEGATIVE NEGATIVE   Ketones, ur NEGATIVE NEGATIVE mg/dL   Protein, ur TRACE (A) NEGATIVE mg/dL   Nitrite POSITIVE (A) NEGATIVE   Leukocytes,Ua LARGE (A) NEGATIVE   RBC / HPF 6-10 0 - 5 RBC/hpf   WBC, UA >50 0 - 5 WBC/hpf   Bacteria, UA MANY (A) NONE SEEN   Squamous Epithelial / HPF 11-20 0 - 5 /HPF   Mucus PRESENT   Magnesium    Collection Time: 08/21/23  1:59 PM  Result Value Ref Range   Magnesium  2.1 1.7 - 2.4 mg/dL  Phosphorus   Collection Time: 08/21/23  1:59 PM  Result Value Ref Range   Phosphorus 3.6 2.5 - 4.6 mg/dL   MR LUMBAR SPINE W CONTRAST Result Date: 08/21/2023 CLINICAL DATA:  Myelopathy, acute, lumbar spine without done, needing with, low back pain radiating down to bilateral legs EXAM: MRI LUMBAR SPINE WITH CONTRAST TECHNIQUE: Multiplanar and multiecho pulse sequences of the lumbar spine were obtained with intravenous contrast. CONTRAST:  8.5mL GADAVIST  GADOBUTROL  1 MMOL/ML IV SOLN COMPARISON:  MRI lumbar spine from earlier today. FINDINGS: Postcontrast imaging was performed to further evaluate findings on MRI lumbar spine from earlier today. Left L3-L4 perifacet enhancement, correlating with edema seen on recent noncontrast MRI. There are postoperative changes at this level including left laminectomy. No discrete, drainable fluid collection. No abnormal enhancement within the canal or involving the vertebral bodies. Please see MRI from earlier today for additional findings including level by level evaluation. IMPRESSION: Postcontrast only imaging was performed to further evaluate findings on MRI lumbar spine from earlier today. Left L3-L4 perifacet enhancement at the site of a prior  intervention, correlating with edema seen earlier. Findings could be in part degenerative and/or postoperative in etiology, but infection is not excluded. No discrete, drainable fluid collection. Electronically Signed   By: Gilmore GORMAN Molt M.D.   On: 08/21/2023 21:47   MR LUMBAR SPINE WO CONTRAST Result Date: 08/21/2023 EXAM: MRI LUMBAR SPINE 08/21/2023 06:56:18 PM TECHNIQUE: Multiplanar multisequence MRI of the lumbar spine was performed without the administration of intravenous contrast. COMPARISON: MRI lumbar spine 10/17/2022. CLINICAL HISTORY: Low back pain, prior surgery, new symptoms. Patient in extreme pain, unable to complete exam, demanded to be taken off scanner immediately. Limited study. FINDINGS: BONES AND ALIGNMENT: Transitional anatomy at the lumbosacral junction with a partially lumbarized S1 vertebra. There is a rudimentary disc at the S1-2 level. Lumbar lordosis is maintained. No significant listhesis. Mildly heterogeneous bone marrow signal intensity with multiple small hemangiomas noted. No evidence of fracture or suspicious osseous lesion. SPINAL CORD: The conus medullaris terminates at the L1-2 level. SOFT TISSUES: There is edema within the paraspinal musculature on the left at L3-4. L1-L2: There is no significant spinal canal or foraminal stenosis. L2-L3: There is a diffuse disc bulge resulting in mild lateral recess narrowing. Moderate facet arthrosis bilaterally. Thickening of the ligamentum flavum. Mild spinal canal stenosis, slightly increased from prior. There is moderate left and mild right foraminal stenosis, similar to prior. L3-L4: There is a small disc bulge resulting in mild lateral recess narrowing. Severe bilateral facet arthrosis. There is a facet effusion noted on the left. Thickening of the ligamentum flavum, similar. Mild spinal canal stenosis. There is moderate right and mild left foraminal stenosis, similar to prior. L4-L5: There is a diffuse disc bulge resulting in  mild lateral recess narrowing. Moderate facet arthrosis and thickening of the ligamentum flavum. No significant spinal canal stenosis.  There is mild bilateral foraminal stenosis. L5-S1: There is a diffuse disc bulge slightly eccentric to the right with lateral recess narrowing, more pronounced on the right. Moderate facet arthrosis and thickening of the ligamentum flavum. No significant spinal canal stenosis. Moderate-to-severe right and mild left foraminal stenosis. STOMACH AND BOWEL: There is diverticulosis in the partially visualized sigmoid colon. IMPRESSION: 1. Transitional anatomy at the lumbosacral junction. 2. Facet effusion on the left at L3-4 with edema in the adjacent paraspinal musculature which may contribute to back pain. Superimposed septic facet arthrosis cannot be excluded. 3. Mild spinal canal stenosis at L2-3 is slightly increased from prior. Additional mild spinal canal stenosis at L3-4 is similar. 4. Multilevel foraminal stenosis, greatest and moderate-to-severe on the right at L5-S1. 5. Moderate foraminal stenosis on the left at L2-3 and on the right at L3-4, similar to prior. Electronically signed by: Donnice Mania MD 08/21/2023 07:20 PM EDT RP Workstation: [id number]   US  Venous Img Lower Bilateral Result Date: 08/21/2023 CLINICAL DATA:  BILATERAL lower extremity pain for 2 days. Chronic back pain. EXAM: Bilateral Lower Extremity Venous Doppler Ultrasound TECHNIQUE: Gray-scale sonography with compression, as well as color and duplex ultrasound, were performed to evaluate the deep venous system(s) from the level of the common femoral vein through the popliteal and proximal calf veins. COMPARISON:  None available FINDINGS: VENOUS Normal compressibility of the common femoral, superficial femoral, and popliteal veins, as well as the visualized calf veins. Visualized portions of profunda femoral vein and great saphenous vein unremarkable. No filling defects to suggest DVT on grayscale or color  Doppler imaging. Doppler waveforms show normal direction of venous flow, normal respiratory plasticity and response to augmentation. OTHER None. Limitations: Patient could not tolerate compressions IMPRESSION: No lower extremity DVT. Electronically Signed   By: Aliene Lloyd M.D.   On: 08/21/2023 14:50       Bernis Ernst, PA-C 08/22/23 0034    Yolande Lamar BROCKS, MD 08/24/23 [phone]

## 2023-08-21 NOTE — ED Triage Notes (Signed)
 C/o bilateral leg pain from hips down to feet x 2 days. Denies any injuries. Chronic back pain.

## 2023-08-21 NOTE — ED Notes (Signed)
 Called Lauren at [facility] for transport

## 2023-08-21 NOTE — ED Triage Notes (Signed)
 C/O bilateral leg pain that starts in groin and shoots down. Hx of DVT, sent over here for MRI. Denies SHOB/CP. 1mg  of dilaudid  given at 1330. New requirement of 2L of O2.

## 2023-08-21 NOTE — Telephone Encounter (Signed)
 Patient stated she is concerned she has been having horrendous bad leg pains which runs from her buttocks to her ankle and started 2 days ago.  Patient stated she thinks this may be heart related.

## 2023-08-21 NOTE — ED Provider Notes (Signed)
 [place] EMERGENCY DEPARTMENT AT [facility] Provider Note   CSN: [id number] Arrival date & time: 08/21/23  1051     Patient presents with: Leg Pain   [name] Long is a 83 y.o. female.   83 year old female with history of CAD s/p CABG x 3, HLD, lumbar radiculopathy, HTN, renal cell cancer s/p right partial nephrectomy, history of right hip and knee replacements, asthma presenting due to bilateral leg pain from hips down to feet x 2 days.  Endorses history of chronic back pain, had back surgery earlier this year.  Denies any recent injuries. She reached out to her cardiologist office today and they recommended she be seen in the ED.  Pain onset yesterday evening while at rest. Did notice some mild discomfort earlier in the day while walking but it became much more severe overnight. Pain is constant and persistent, has not responded to home Dilaudid . Worsens with walking/movement. Able to bear weight and walk but it hurts. Does feel worse on the back of her leg mainly behind her knee and in her hamstrings Denies numbness, tingling, weakness, swelling, shooting pains, chest pain, shortness of breath, fevers.   Leg Pain Associated symptoms: no fever       Prior to Admission medications   Medication Sig Start Date End Date Taking? Authorizing Provider  acetaminophen  (TYLENOL ) 500 MG tablet Take 500 mg by mouth every 6 (six) hours as needed for moderate pain.    [provider]  albuterol  (PROVENTIL  HFA) 108 (90 Base) MCG/ACT inhaler INHALE 2 PUFF AS DIRECTED FOUR TIMES A DAY AS NEEDED 05/03/23   Joyce Norleen BROCKS, MD  amLODipine  (NORVASC ) 5 MG tablet TAKE 1 TABLET BY MOUTH DAILY 03/29/23   Court Dorn PARAS, MD  aspirin  EC 81 MG tablet Take 81 mg by mouth daily. Swallow whole.    [provider]  atorvastatin  (LIPITOR ) 20 MG tablet TAKE 1 TABLET BY MOUTH DAILY 07/16/23   Court Dorn PARAS, MD  Calcium  Carb-Cholecalciferol  (CALTRATE 600+D3 PO) Take 1 tablet by  mouth daily.    [provider]  carisoprodol  (SOMA ) 350 MG tablet Take 350 mg by mouth every 8 (eight) hours as needed (for arthritis pain).    [provider]  Cholecalciferol  (VITAMIN D ) 50 MCG (2000 UT) tablet Take 2,000 Units by mouth daily.    [provider]  esomeprazole (NEXIUM) 20 MG capsule Take 20 mg by mouth in the morning and at bedtime.    [provider]  Evolocumab  (REPATHA  SURECLICK) 140 MG/ML SOAJ INJECT 140 MG INTO THE SKIN EVERY 14 DAYS 11/29/22   Court Dorn PARAS, MD  fluticasone  (FLONASE ) 50 MCG/ACT nasal spray Place 1 spray into both nostrils daily.    [provider]  gabapentin  (NEURONTIN ) 600 MG tablet Take 1,200-1,800 mg by mouth See admin instructions. Take 2 tablets (1200 mg) by mouth in the morning & take 3 tablets (1800 mg) by mouth at night    [provider]  HYDROmorphone  (DILAUDID ) 4 MG tablet Take 1 tablet (4 mg total) by mouth every 4 (four) hours as needed for severe pain. 02/27/18   Cory Palma, PA-C  magnesium  oxide (MAG-OX) 400 MG tablet Take 400 mg by mouth at bedtime.    [provider]  Melatonin 10 MG CAPS Take 10 mg by mouth at bedtime as needed (sleep).    [provider]  metoprolol  tartrate (LOPRESSOR ) 25 MG tablet Take 1 tablet (25 mg total) by mouth 2 (two) times daily. 05/03/23  Joyce Norleen BROCKS, MD  montelukast  (SINGULAIR ) 10 MG tablet TAKE 1 TABLET BY MOUTH DAILY 06/04/23   Lalonde, John C, MD  Multiple Vitamins-Minerals (PRESERVISION AREDS 2 PO) Take 1 tablet by mouth in the morning and at bedtime.    [provider]  nitroGLYCERIN  (NITROSTAT ) 0.4 MG SL tablet Place 1 tablet (0.4 mg total) under the tongue every 5 (five) minutes x 3 doses as needed for chest pain. 11/28/22   Court Dorn PARAS, MD  Polyethyl Glycol-Propyl Glycol (SYSTANE OP) Place 1 drop into both eyes 2 (two) times daily.    [provider]  silodosin  (RAPAFLO ) 8 MG CAPS capsule Take 1 capsule  (8 mg total) by mouth daily. 05/03/23   Joyce Norleen BROCKS, MD    Allergies: Dimenhydrinate, Morphine  and codeine, Percocet [oxycodone -acetaminophen ], and Zolpidem     Review of Systems  Constitutional:  Negative for fever.  Respiratory:  Negative for shortness of breath.   Cardiovascular:  Negative for chest pain.  Gastrointestinal:  Negative for abdominal pain, nausea and vomiting.  Genitourinary:  Negative for dysuria and hematuria.  Musculoskeletal:  Negative for joint swelling.  Neurological:  Negative for weakness and numbness.    Updated Vital Signs BP 126/78 (BP Location: Right Arm)   Pulse 92   Temp 98.6 F (37 C) (Oral)   Resp 17   SpO2 93%   Physical Exam Constitutional:      General: She is not in acute distress. HENT:     Head: Normocephalic and atraumatic.  Cardiovascular:     Rate and Rhythm: Normal rate and regular rhythm.     Pulses: Normal pulses.     Heart sounds: Normal heart sounds. No murmur heard. Abdominal:     General: There is no distension.     Palpations: Abdomen is soft.     Tenderness: There is no abdominal tenderness.  Musculoskeletal:     Comments: No significant swelling, asymmetry, deformities, wounds, or redness/warmth in bilateral lower extremities. Does have tenderness to palpation of her musculature throughout b/l legs, worse in popliteal region and posterior thigh. Sensation and motor function intact distally. 5/5 strength though limited due to pain. 2+ DP pulses bilaterally.  Skin:    Capillary Refill: Capillary refill takes less than 2 seconds.  Neurological:     General: No focal deficit present.     Mental Status: She is alert. Mental status is at baseline.     Sensory: No sensory deficit.     Motor: No weakness.     (all labs ordered are listed, but only abnormal results are displayed) Labs Reviewed  COMPREHENSIVE METABOLIC PANEL WITH GFR - Abnormal; Notable for the following components:      Result Value   Glucose, Bld 123 (*)     Creatinine, Ser 1.02 (*)    Alkaline Phosphatase 138 (*)    GFR, Estimated 55 (*)    All other components within normal limits  CBC WITH DIFFERENTIAL/PLATELET  CK  MAGNESIUM   PHOSPHORUS  URINALYSIS, ROUTINE W REFLEX MICROSCOPIC    EKG: None  Radiology: No results found.   Procedures   Medications Ordered in the ED  HYDROmorphone  (DILAUDID ) injection 1 mg (1 mg Intravenous Given 08/21/23 1330)  sodium chloride  0.9 % bolus 1,000 mL (1,000 mLs Intravenous New Bag/Given 08/21/23 1333)  Medical Decision Making 83 year old female with history of CAD s/p CABG x 3, HLD, lumbar radiculopathy, HTN, renal cell cancer s/p right partial nephrectomy, history of right hip and knee replacements, hx DVT not on anticoagulation, asthma presenting due to bilateral leg pain and tenderness from hips down to feet x 2 days.  Afebrile and hemodynamically stable Her history and exam findings do seem consistent with myositis/rhabdomyolysis, will check CK, UA and kidney function especially given her history of renal cell carcinoma s/p right partial nephrectomy.  Will start NS bolus Also evaluate for electrolyte abnormalities, anemia CMP/CBC, Mg, phos Given her history of significant CAD curious if there is a component of PAD causing claudication type symptoms.  She reassuringly does have normal perfusion on exam with normal capillary refill and DP pulses with no other neurovascular deficits. No obvious signs of infection/cellulitis  Obtain b/l DVT ultrasound given hx DVTs though reassuringly no significant swelling/asymmetry or warmth on exam  Treat pain with IV Dilaudid , history of morphine  and Percocet allergy IV Fluids as noted   2:17 PM CK and renal function unremarkable. Alk phos stably elevated Still having persistent pain. Upon further questioning patient does now indeed endorse shooting sensation of her pain, worse with movement of her legs, and now  endorses some tingling/numbness sensation in her feet. Given her history of spine surgery earlier this year with radiculopathy and numbness, feel patient would benefit from MRI of her lumbar spine for further eval. Unfortunately not available at [facility], plan to initiate ED-ED transfer to [facility]. Discussed with patient who agrees. Discussed with Dr. Elnor at [facility] who accepts patient for transfer for MRI. Reevaluate dispo based on results, anticipate she may require admission given her refractory pain, comorbidities, and inability to ambulate on her own.  Amount and/or Complexity of Data Reviewed Labs: ordered.  Risk Prescription drug management.        Final diagnoses:  Pain in both lower extremities    ED Discharge Orders     None          Romelle Booty, MD 08/21/23 1436    Tegeler, Lonni PARAS, MD 08/21/23 1459

## 2023-08-23 DIAGNOSIS — Z08 Encounter for follow-up examination after completed treatment for malignant neoplasm: Secondary | ICD-10-CM | POA: Diagnosis not present

## 2023-08-23 DIAGNOSIS — L570 Actinic keratosis: Secondary | ICD-10-CM | POA: Diagnosis not present

## 2023-08-23 DIAGNOSIS — D225 Melanocytic nevi of trunk: Secondary | ICD-10-CM | POA: Diagnosis not present

## 2023-08-23 DIAGNOSIS — L814 Other melanin hyperpigmentation: Secondary | ICD-10-CM | POA: Diagnosis not present

## 2023-08-23 DIAGNOSIS — Z86007 Personal history of in-situ neoplasm of skin: Secondary | ICD-10-CM | POA: Diagnosis not present

## 2023-08-23 DIAGNOSIS — Z85828 Personal history of other malignant neoplasm of skin: Secondary | ICD-10-CM | POA: Diagnosis not present

## 2023-08-23 DIAGNOSIS — L821 Other seborrheic keratosis: Secondary | ICD-10-CM | POA: Diagnosis not present

## 2023-08-23 DIAGNOSIS — L853 Xerosis cutis: Secondary | ICD-10-CM | POA: Diagnosis not present

## 2023-08-23 LAB — URINE CULTURE: Culture: >=100000 — AB

## 2023-08-24 ENCOUNTER — Telehealth (HOSPITAL_BASED_OUTPATIENT_CLINIC_OR_DEPARTMENT_OTHER): Payer: Self-pay | Admitting: *Deleted

## 2023-08-24 NOTE — Telephone Encounter (Signed)
 Post ED Visit - Positive Culture Follow-up  Culture report reviewed by antimicrobial stewardship pharmacist: [facility] [x]  Feliciano Close, Pharm.D. []  Venetia Gully, Pharm.D., BCPS AQ-ID []  Garrel Crews, Pharm.D., BCPS []  Almarie Lunger, [address].D., BCPS []  [place], [address].D., BCPS, AAHIVP []  Rosaline Bihari, Pharm.D., BCPS, AAHIVP []  Vernell Meier, PharmD, BCPS []  Latanya Hint, PharmD, BCPS []  Donald Medley, PharmD, BCPS []  Rocky Bold, PharmD []  Dorothyann Alert, PharmD, BCPS []  Morene Babe, PharmD  [facility] []  Rosaline Edison, PharmD []  Romona Bliss, PharmD []  Dolphus Roller, PharmD []  Veva Seip, Rph []  Vernell Daunt) Leonce, PharmD []  Eva Allis, PharmD []  Rosaline Millet, PharmD []  Iantha Batch, PharmD []  Arvin Gauss, PharmD []  Wanda Hasting, PharmD []  Ronal Rav, PharmD []  Rocky Slade, PharmD []  Bard Jeans, PharmD   Positive urine culture Treated with cefpodoxime  proxetil, organism sensitive to the same and no further patient follow-up is required at this time.  [name] Long 08/24/2023, 8:25 AM

## 2023-08-28 DIAGNOSIS — M4726 Other spondylosis with radiculopathy, lumbar region: Secondary | ICD-10-CM | POA: Diagnosis not present

## 2023-08-28 DIAGNOSIS — Z6832 Body mass index (BMI) 32.0-32.9, adult: Secondary | ICD-10-CM | POA: Diagnosis not present

## 2023-08-29 DIAGNOSIS — M47812 Spondylosis without myelopathy or radiculopathy, cervical region: Secondary | ICD-10-CM | POA: Diagnosis not present

## 2023-08-29 DIAGNOSIS — M150 Primary generalized (osteo)arthritis: Secondary | ICD-10-CM | POA: Diagnosis not present

## 2023-08-29 DIAGNOSIS — G894 Chronic pain syndrome: Secondary | ICD-10-CM | POA: Diagnosis not present

## 2023-08-29 DIAGNOSIS — Z79891 Long term (current) use of opiate analgesic: Secondary | ICD-10-CM | POA: Diagnosis not present

## 2023-09-13 ENCOUNTER — Encounter: Payer: Self-pay | Admitting: General Practice

## 2023-09-18 ENCOUNTER — Other Ambulatory Visit (INDEPENDENT_AMBULATORY_CARE_PROVIDER_SITE_OTHER)

## 2023-09-18 DIAGNOSIS — Z23 Encounter for immunization: Secondary | ICD-10-CM

## 2023-10-25 DIAGNOSIS — G894 Chronic pain syndrome: Secondary | ICD-10-CM | POA: Diagnosis not present

## 2023-10-25 DIAGNOSIS — Z79891 Long term (current) use of opiate analgesic: Secondary | ICD-10-CM | POA: Diagnosis not present

## 2023-10-25 DIAGNOSIS — M47812 Spondylosis without myelopathy or radiculopathy, cervical region: Secondary | ICD-10-CM | POA: Diagnosis not present

## 2023-10-25 DIAGNOSIS — M150 Primary generalized (osteo)arthritis: Secondary | ICD-10-CM | POA: Diagnosis not present

## 2023-11-01 ENCOUNTER — Ambulatory Visit: Payer: Self-pay

## 2023-11-01 NOTE — Telephone Encounter (Signed)
 FYI Only or Action Required?: Action required by provider: request for appointment and clinical question for provider.  Patient was last seen in primary care on 05/03/2023 by Joyce Norleen BROCKS, MD.  Called Nurse Triage reporting Fall.  Symptoms began a week ago.  Interventions attempted: Nothing.  Symptoms are: unchanged.  Triage Disposition: Go to ED Now (or PCP Triage)  Patient/caregiver understands and will follow disposition?: No, wishes to speak with PCP   Copied from CRM [id number]. Topic: Clinical - Red Word Triage >> Nov 01, 2023  4:09 PM [name] Long wrote: Red Word that prompted transfer to Nurse Triage: Patient called in stated she fell and hit her breast states she is in pain , stated she may need to schedule mammogram Reason for Disposition  [1] Unable to get up until help (e.g., caregiver, family, friend) arrived AND [2] on the ground 1 hour or more  Answer Assessment - Initial Assessment Questions Advised ED.   Patient declined and requests call back/ appointment/ order for mammogram   1. MECHANISM: How did the fall happen?     Slipped and fell down stairs, hit right breast; implant in place. Hurts to move arm, denies difficulty breathing 3. ONSET: When did the fall happen? (e.g., minutes, hours, or days ago)     Week ago 4. LOCATION: What part of the body hit the ground? (e.g., back, buttocks, head, hips, knees, hands, head, stomach)     Fell forward on breast 5. INJURY: Did you hurt (injure) yourself when you fell? If Yes, ask: What did you injure? Tell me more about this? (e.g., body area; type of injury; pain severity)     Only breast 6. PAIN: Is there any pain? If Yes, ask: How bad is the pain? (e.g., Scale 0-10; or none, mild,      7/10 7. SIZE: For cuts, bruises, or swelling, ask: How large is it? (e.g., inches or centimeters)      denies  9. OTHER SYMPTOMS: Do you have any other symptoms? (e.g., dizziness, fever, weakness; new-onset or  worsening).      Denies dizziness, sob, weakness, chest pain 10. CAUSE: What do you think caused the fall (or falling)? (e.g., dizzy spell, tripped)       Slipped when reaching  Protocols used: Falls and [facility]

## 2023-11-02 NOTE — Telephone Encounter (Signed)
 Called patient and scheduled for Tuesday, She did not actually fall just bumped up against stairs.

## 2023-11-05 ENCOUNTER — Ambulatory Visit (HOSPITAL_BASED_OUTPATIENT_CLINIC_OR_DEPARTMENT_OTHER)
Admission: RE | Admit: 2023-11-05 | Discharge: 2023-11-05 | Disposition: A | Discharge status: Home / Self Care | Source: Ambulatory Visit | Attending: Family Medicine | Admitting: Family Medicine

## 2023-11-05 DIAGNOSIS — Z780 Asymptomatic menopausal state: Secondary | ICD-10-CM | POA: Diagnosis not present

## 2023-11-05 DIAGNOSIS — M8580 Other specified disorders of bone density and structure, unspecified site: Secondary | ICD-10-CM | POA: Diagnosis present

## 2023-11-05 DIAGNOSIS — M8589 Other specified disorders of bone density and structure, multiple sites: Secondary | ICD-10-CM | POA: Diagnosis not present

## 2023-11-06 ENCOUNTER — Ambulatory Visit: Admitting: Family Medicine

## 2023-11-06 ENCOUNTER — Encounter: Payer: Self-pay | Admitting: Family Medicine

## 2023-11-06 VITALS — BP 132/76 | HR 78 | Ht 64.0 in | Wt 186.8 lb

## 2023-11-06 DIAGNOSIS — S2001XA Contusion of right breast, initial encounter: Secondary | ICD-10-CM | POA: Diagnosis not present

## 2023-11-06 NOTE — Progress Notes (Signed)
   Subjective:    Patient ID: [name] Long, female    DOB: 1940-08-26, 83 y.o.   MRN: [id number]  Discussed the use of AI scribe software for clinical note transcription with the patient, who gave verbal consent to proceed.  History of Present Illness   [name] Long is an 83 year old female who presents with breast pain following an injury.  She injured herself while painting at home approximately a week and a half ago. While sitting on the stairs at a bad angle, her hand slipped and hit her breast, specifically the nipple area, which has had no sensation for about thirty years.  She has a breast implant in the affected area and initially worried it might have burst. Despite the absence of external bruising, she describes the pain as severe, stating 'it hurts like hell.' The pain is present even without touching the area and is exacerbated by using her arm.  She has not been using any specific treatment for the pain since the injury occurred.           Review of Systems     Objective:    Physical Exam Physical Exam    BREAST: No external bruising on breast, pain    . Noi nduration of the right breast       Assessment & Plan:  Contusion of right breast, initial encounter Recommend conservative care with heat for 20 minutes 3 times per day.  She was comfortable with that.   Breast contusion Pain likely due to tissue damage from impact. No implant rupture. - Apply heat to affected area for 20 minutes, 2-3 times per day.

## 2023-12-09 NOTE — Progress Notes (Unsigned)
 Cardiology Clinic Note   Patient Name: [name] Long Date of Encounter: 12/11/2023  Primary Care Provider:  Joyce Norleen BROCKS, MD Primary Cardiologist:  Dorn Lesches, MD  Patient Profile    [name] Long 83 year old female presents the clinic today for follow-up evaluation of her coronary artery disease.  Past Medical History    Past Medical History:  Diagnosis Date   Abnormal EKG 01/12/2011   Allergy    RHINITIS   Anemia    Arthritis    neck, back knees, hips   Asthma    Asthma 01/12/2011   Back pain, chronic--plans were for cervical disc surgery week of 01/16/11 01/12/2011   Blood clot in vein 2004   L leg   Blood transfusion without reported diagnosis 2004   Cancer [facility])    Kidney   Chronic kidney disease    Tumor removed from right kidney   Chronic low back pain 2020   Tumor eemoved from kidney,  Has another one that is being monitired.   Complication of anesthesia    post op bladder retention after surgery, can't void   Complication of anesthesia    Laryngeal Spasms (has not occured during surgery/intubation)   Connective tissue disease    COPD (chronic obstructive pulmonary disease) (HCC)    Coronary artery disease    s/p CABG x 3 in 2013, hx of PCI with DES   Crescendo angina, with EKG changes, new. 01/12/2011   Dyspnea    Fibromyalgia    Fibromyalgia    GERD (gastroesophageal reflux disease)    Headache    migraine - several in a yr.    History of stress test 04/2011   Normal stress test   Hx of echocardiogram 07/19/2011   EF 50-55%. There is mild annular calcification, there is trace migtral regurgitation, there is trace tricupid regurgitation, pericardium is thick, there is no pericardial effusion.   Hyperlipidemia    Hypertension    Myocardial infarction [facility]) 2013   triple heart bypass   Neuromuscular disorder (HCC)    fibromyalgia    Osteoporosis    OSTEOPENIA   Personal history of colonic polyp - adenoma 08/05/2013   08/2013 - 7 mm  adenoma - consider repeat colonoscopy 2020 (age 32)   Pneumonia 2018   numerous times, only admitted one time for pneumonia but she has had pneumonia numerous times;   02-19-2018 END OF NOVEMBER 2019  WAS ADMITTED FOR ABOUT  A WEEK DUE TO DOUBLE PNEUMONIA, REPORTS TODAY FEELING BACK TO HER NORMAL , DENIES SOB, O2 SAT AT 94 ROOM AIR , AFEBRILE, OTHER VSS    PONV (postoperative nausea and vomiting)    PPD positive    Right carotid bruit    Status post coronary artery bypass grafting    Urinary incontinence    post op, sleepy bladder, urinar retention    Past Surgical History:  Procedure Laterality Date   ABDOMINAL HYSTERECTOMY  1981   ANTERIOR CERVICAL DECOMP/DISCECTOMY FUSION N/A 07/14/2022   Procedure: Cervical three-four Anterior Cervical Decompression Fusion;  Surgeon: Gillie Duncans, MD;  Location: [facility] OR;  Service: Neurosurgery;  Laterality: N/A;   APPENDECTOMY  1946   BIOPSY BREAST Left 03/27/2019   Benign fibroaipose tissue no evidence of malignancy   BLADDER REPAIR  1985   BREAST SURGERY  1992   REDUCTION, x3 surgeries overall on breast for augmentation    CARDIAC CATHETERIZATION  2013   CARPAL TUNNEL RELEASE Bilateral 1988, 1999   CERVICAL FUSION  1992, 1998, 1999   CORONARY ARTERY BYPASS GRAFT  01/14/2011   Procedure: CORONARY ARTERY BYPASS GRAFTING (CABG);  Surgeon: Maude Salinas Hanford MOULD, MD;  Location: [facility] OR;  Service: Open Heart Surgery;  Laterality: N/A;  CABG x three, using right greater saphenous vein harvested endoscopically   CORONARY ATHERECTOMY N/A 09/02/2020   Procedure: CORONARY ATHERECTOMY;  Surgeon: Jordan, Peter M, MD;  Location: [facility] INVASIVE CV LAB;  Service: Cardiovascular;  Laterality: N/A;   CORONARY STENT INTERVENTION N/A 09/02/2020   Procedure: CORONARY STENT INTERVENTION;  Surgeon: Jordan, Peter M, MD;  Location: [facility] INVASIVE CV LAB;  Service: Cardiovascular;  Laterality: N/A;   CORONARY ULTRASOUND/IVUS N/A 09/02/2020   Procedure: Intravascular Ultrasound/IVUS;   Surgeon: Jordan, Peter M, MD;  Location: [facility] INVASIVE CV LAB;  Service: Cardiovascular;  Laterality: N/A;   EYE SURGERY Bilateral 1985   radiokerototomy   FORAMINOTOMY 1 LEVEL Left 03/16/2023   Procedure: FORAMINOTOMY 1 LEVEL;  Surgeon: Gillie Duncans, MD;  Location: [facility] OR;  Service: Neurosurgery;  Laterality: Left;  Sublaminar decompression - L3-L4 - left   LEFT HEART CATH AND CORS/GRAFTS ANGIOGRAPHY N/A 08/31/2020   Procedure: LEFT HEART CATH AND CORS/GRAFTS ANGIOGRAPHY;  Surgeon: Claudene Victory ORN, MD;  Location: [facility] INVASIVE CV LAB;  Service: Cardiovascular;  Laterality: N/A;   LEFT HEART CATHETERIZATION WITH CORONARY ANGIOGRAM N/A 01/13/2011   Procedure: LEFT HEART CATHETERIZATION WITH CORONARY ANGIOGRAM;  Surgeon: Debby DELENA Sor, MD;  Location: [facility] LAB;  Service: Cardiovascular;  Laterality: N/A;   PERIPHERAL INTRAVASCULAR LITHOTRIPSY  09/02/2020   Procedure: INTRAVASCULAR LITHOTRIPSY;  Surgeon: Jordan, Peter M, MD;  Location: [facility] INVASIVE CV LAB;  Service: Cardiovascular;;   PUBOVAGINAL SLING  2002   ROBOT ASSISTED LAPAROSCOPIC NEPHRECTOMY Right 02/27/2018   Procedure: XI ROBOTIC ASSISTE PARTIAL NEPHRECTOMY;  Surgeon: Devere Lonni Righter, MD;  Location: [facility] ORS;  Service: Urology;  Laterality: Right;   STERNAL WOUND DEBRIDEMENT  07/27/2011   Procedure: STERNAL WOUND DEBRIDEMENT;  followed by wound vac Surgeon: Maude Salinas Hanford, MD;  Location: [facility] OR;  Service: Open Heart Surgery;  Laterality: N/A;   TONSILLECTOMY  1952   TOTAL HIP ARTHROPLASTY Right 08/22/2016   Procedure: TOTAL HIP ARTHROPLASTY ANTERIOR APPROACH;  Surgeon: Sheril Maude, MD;  Location: [facility] OR;  Service: Orthopedics;  Laterality: Right;   TOTAL KNEE ARTHROPLASTY Right 07/22/2019   Procedure: RIGHT TOTAL KNEE ARTHROPLASTY;  Surgeon: Sheril Maude, MD;  Location: [facility] ORS;  Service: Orthopedics;  Laterality: Right;   TRIGGER FINGER RELEASE Right 2014   4th finger   TROCHANTERIC BURSA EXCISION Right    bursa removed, R hip    TUBAL LIGATION      Allergies  Allergies  Allergen Reactions   Dimenhydrinate Other (See Comments)    locks up kidneys   Morphine  And Codeine Nausea And Vomiting   Percocet [Oxycodone -Acetaminophen ] Other (See Comments)    hallucinations   Zolpidem  Other (See Comments)    Unusual behavior    History of Present Illness    NICKOLE ADAMEK has a PMH of coronary artery disease, essential hypertension, allergic rhinitis, GERD, osteopenia, fibromyalgia, coronary artery disease status post CABG x3 in 2013.  She was seen by Leita Lobstein 7/15.  During that time she was concerned about chest discomfort.  It was felt that she may have musculoskeletal pain versus pericarditis.  Echocardiogram was normal.  She was seen in follow-up by Dr. 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  She required hospitalization and debridement.  She  had a wound VAC placed in her wound completely healed.  She underwent nuclear stress testing which showed a normal result and also had normal echocardiogram.  It was felt that her increase in shortness of breath was related to exacerbation of her asthma at that time.     She was admitted 08/31/2020 with chest pain and shortness of breath.  She underwent cardiac catheterization by Dr. Claudene.  She was noted to have high-grade distal left main disease with occluded LAD after her first diagonal branch and patent LIMA-LAD, patent SVG-OM, occluded SVG-RCA and high-grade calcified proximal RCA.  She underwent complex IVUS guided orbital arthrectomy with shockwave angioplasty and received DES of her RCA by Dr. Jordan on 09/02/2020.  She was discharged the following day.  She felt that her shortness of breath had somewhat improved.  She contacted the nurse triage line on 09/14/2021.  During that time she reported a blood pressure of 138/75 and a pulse of 88.  She noted that her legs and arm felt heavy when she would walk.  She noted no palpitations or chest pain.  She  did report shortness of breath and dizziness at times.  She presented to the clinic 09/19/2021 for follow-up evaluation and stated over the last couple months she had noticed increased dyspnea on exertion and dizziness.  She noticed dizziness each time she bent down.  She had been trying to stay physically active but was somewhat limited due to her back pain.  She had been going to the gym 3 days/week and had been trying to exercise for 30 minutes.  She reported increased dyspnea with these activities.  She did not feel that these symptoms were similar to her previous anginal symptoms.  We reviewed her previous cardiac catheterization.  I  asked her to increase her p.o. hydration, may drink sports drink,  ordered an echocardiogram, ordered CBC BMP and planned follow-up in 1-2 months.  Follow-up echocardiogram showed an EF of 55-60%, G2 DD, mildly dilated left and right atria, and no significant valvular abnormalities.  She was seen by Almarie Crate, NP-C on 10/27/2021.  During that time she reported that her shortness of breath and dizziness had not improved.  She had a visit scheduled to see pulmonology.  Her echocardiogram was reviewed.  She expressed understanding.  She noted that she had previously completed physical therapy for vertigo.  Her PCP had wanted to send her to a specialist at [facility] which she did not want to do.  She denied chest pain and increased.  Her blood pressure was well-controlled.  She denied presyncope and syncope.  She was not noted to have lower extremity edema.  She was noted to have a right carotid bruit.  Carotid Dopplers were ordered and showed minimal 1-39% bilateral stenosis.  Repeat Doppler study was recommended for 1 year.      She presented to the clinic 12/06/21 for follow-up evaluation stated her legs felt like they have much more energy with the use of lower extremity support stockings.  She continued to be dizzy.  She was following with neurology and was planning spine  surgery.  She continued to walk with a cane.  We reviewed her echocardiogram and carotid Dopplers.  She expressed understanding.  I explained that it did not appear that her dizziness is related to cardiac issues.  I provided her with Epley maneuvers, had her follow-up with Dr. Court as scheduled, and continued her current physical activity and diet.    She was seen  in follow-up by Dr. Court 02/21/2022.  During that time she remained stable from a cardiac standpoint.  She was preparing for C3/C4 laminectomy.  She presented to the clinic 08/24/22 for follow-up evaluation and stated her neck surgery went well.  Her biggest complaint was that she had trouble sleeping.  She reported that she was not sleep well.  She was taking melatonin and sleeping well in her recliner.  Sleeping in her recliner was not new.  She had been doing this for quite some time.  She reported being very physically active doing housework and yard work.  Her blood pressure was well-controlled at 130/80.  We reviewed her medications.  We reviewed her previous echocardiogram.  She expressed understanding.  I  refilled her cardiac medications and planned follow-up in 6 months.  I gave her the sleep hygiene instructions and asked her to follow up with her PCP to review her insomnia.  She was seen in follow-up by Dr. Court on 03/08/2023.  During that time she continued to be stable from a cardiac standpoint.  She denied chest pain and shortness of breath.  She reported that she needed to have operation on L4-L5 the coming Friday.  Her prior echocardiogram was reviewed.  She presents to the clinic today for follow-up evaluation and states she continues to do well.  She notes that she does struggle with her laryngeal spasm.  She is going to see a specialist in January for this.  We reviewed her coronary artery disease.  She expressed understanding.  Her blood pressure is well-controlled today.  Her heart rate is 73.  She has been physically active  and is going to the gym to walk for 20 minutes 3 times per week.  She does well with this.  She does note that when she is on her stationary bike she has less shortness of breath than when she is walking.  I encouraged her to continue her physical activity.  I will refill her sublingual nitroglycerin  and Repatha .  Will plan follow-up in 6 to 9 months.  Today she denies chest pain, shortness of breath, lower extremity edema, fatigue, palpitations, melena, hematuria, hemoptysis, diaphoresis, weakness, presyncope, syncope, orthopnea, and PND.    Home Medications    Prior to Admission medications   Medication Sig Start Date End Date Taking? Authorizing Provider  albuterol  (PROVENTIL  HFA) 108 (90 Base) MCG/ACT inhaler INHALE 2 PUFF AS DIRECTED FOUR TIMES A DAY AS NEEDED 12/16/20   Joyce Norleen BROCKS, MD  amLODipine  (NORVASC ) 5 MG tablet Take 1 tablet (5 mg total) by mouth daily. 10/04/20   Madie Jon Garre, PA  aspirin  EC 81 MG tablet Take 81 mg by mouth daily. Swallow whole.    [provider]  atorvastatin  (LIPITOR ) 20 MG tablet Take 1 tablet (20 mg total) by mouth daily. 10/04/20 01/02/21  Duke, Jon Garre, PA  Calcium  Carb-Cholecalciferol  (CALTRATE 600+D3 PO) Take 1 tablet by mouth daily.    [provider]  carisoprodol  (SOMA ) 350 MG tablet Take 350 mg by mouth every 8 (eight) hours as needed (for arthritis pain).     [provider]  clopidogrel  (PLAVIX ) 75 MG tablet Take 1 tablet (75 mg total) by mouth daily with breakfast. 11/18/20   Duke, Jon Garre, PA  Evolocumab  (REPATHA  SURECLICK) 140 MG/ML SOAJ Inject 140 mg into the skin every 14 (fourteen) days. 02/23/21   Court Dorn PARAS, MD  fluticasone -salmeterol (ADVAIR  DISKUS) 500-50 MCG/ACT AEPB Inhale 1 puff into the lungs in the morning  and at bedtime. 12/16/20   Joyce Norleen BROCKS, MD  gabapentin  (NEURONTIN ) 600 MG tablet Take 1,200-1,800 mg by mouth See admin instructions. Take 2 tablets (1200 mg) by mouth in the  morning & take 3 tablets (1800 mg) by mouth at night    [provider]  HYDROmorphone  (DILAUDID ) 4 MG tablet Take 1 tablet (4 mg total) by mouth every 4 (four) hours as needed for severe pain. 02/27/18   Cory Palma, PA-C  magnesium  oxide (MAG-OX) 400 MG tablet Take 400 mg by mouth at bedtime.    [provider]  metoprolol  tartrate (LOPRESSOR ) 25 MG tablet Take 1 tablet (25 mg total) by mouth 2 (two) times daily. 08/31/21   Court Dorn PARAS, MD  montelukast  (SINGULAIR ) 10 MG tablet TAKE ONE TABLET BY MOUTH DAILY 08/04/21   Lalonde, John C, MD  Multiple Vitamins-Minerals (PRESERVISION AREDS 2 PO) Take 1 tablet by mouth in the morning and at bedtime.    [provider]  nitroGLYCERIN  (NITROSTAT ) 0.4 MG SL tablet Place 1 tablet (0.4 mg total) under the tongue every 5 (five) minutes x 3 doses as needed for chest pain. 07/19/21   Court Dorn PARAS, MD  silodosin  (RAPAFLO ) 8 MG CAPS capsule TAKE ONE CAPSULE BY MOUTH DAILY 08/17/21   Joyce Norleen BROCKS, MD    Family History    Family History  Adopted: Yes  Problem Relation Age of Onset   Hypertension Brother    is adopted.   Social History    Social History   Socioeconomic History   Marital status: Married    Spouse name: Not on file   Number of children: Not on file   Years of education: Not on file   Highest education level: Associate degree: academic program  Occupational History   Not on file  Tobacco Use   Smoking status: Former    Current packs/day: 0.00    Types: Cigarettes    Quit date: 01/12/1980    Years since quitting: 43.9   Smokeless tobacco: Never  Vaping Use   Vaping status: Never Used  Substance and Sexual Activity   Alcohol use: Yes    Comment: occassionally   Drug use: No   Sexual activity: Yes  Other Topics Concern   Not on file  Social History Narrative   Right handed   Social Drivers of Health   Financial Resource Strain: Low Risk  (11/02/2023)   Overall Financial Resource  Strain (CARDIA)    Difficulty of Paying Living Expenses: Not hard at all  Food Insecurity: No Food Insecurity (11/02/2023)   Hunger Vital Sign    Worried About Running Out of Food in the Last Year: Never true    Ran Out of Food in the Last Year: Never true  Transportation Needs: No Transportation Needs (11/02/2023)   PRAPARE - Administrator, Civil Service (Medical): No    Lack of Transportation (Non-Medical): No  Physical Activity: Insufficiently Active (11/02/2023)   Exercise Vital Sign    Days of Exercise per Week: 2 days    Minutes of Exercise per Session: 20 min  Stress: No Stress Concern Present (11/02/2023)   [employer] of Occupational Health - Occupational Stress Questionnaire    Feeling of Stress: Not at all  Social Connections: Moderately Integrated (11/02/2023)   Social Connection and Isolation Panel    Frequency of Communication with Friends and Family: More than three times a week    Frequency of Social Gatherings with Friends and Family:  Three times a week    Attends Religious Services: Never    Active Member of Clubs or Organizations: Yes    Attends Engineer, Structural: More than 4 times per year    Marital Status: Married  Catering Manager Violence: Not on file     Review of Systems    General:  No chills, fever, night sweats or weight changes.  Cardiovascular:  No chest pain, dyspnea on exertion, edema, orthopnea, palpitations, paroxysmal nocturnal dyspnea. Dermatological: No rash, lesions/masses Respiratory: No cough, dyspnea Urologic: No hematuria, dysuria Abdominal:   No nausea, vomiting, diarrhea, bright red blood per rectum, melena, or hematemesis Neurologic:  No visual changes, wkns, changes in mental status. All other systems reviewed and are otherwise negative except as noted above.  Physical Exam    VS:  BP 112/76   Pulse 73   Ht 5' 4 (1.626 m)   Wt 186 lb 3.2 oz (84.5 kg)   SpO2 96%   BMI 31.96 kg/m  , BMI Body  mass index is 31.96 kg/m. GEN: Well nourished, well developed, in no acute distress. HEENT: normal. Neck: Supple, no JVD, carotid bruits, or masses. Cardiac: RRR, no murmurs, rubs, or gallops. No clubbing, cyanosis, edema.  Radials/DP/PT 2+ and equal bilaterally.  Respiratory:  Respirations regular and unlabored, clear to auscultation bilaterally. GI: Soft, nontender, nondistended, BS + x 4. MS: no deformity or atrophy. Skin: warm and dry, no rash. Neuro:  Strength and sensation are intact. Psych: Normal affect.  Accessory Clinical Findings    Recent Labs: 08/21/2023: ALT 16; BUN 20; Creatinine, Ser 1.02; Hemoglobin 12.3; Magnesium  2.1; Platelets 158; Potassium 4.8; Sodium 143   Recent Lipid Panel    Component Value Date/Time   CHOL 123 05/03/2023 0923   TRIG 70 05/03/2023 0923   HDL 81 05/03/2023 0923   CHOLHDL 1.5 05/03/2023 0923   CHOLHDL 2.0 09/01/2020 0241   VLDL 11 09/01/2020 0241   LDLCALC 28 05/03/2023 0923         ECG personally reviewed by me today-none today.  Echocardiogram 09/01/2020  IMPRESSIONS     1. Left ventricular ejection fraction, by estimation, is 55 to 60%. The  left ventricle has normal function. The left ventricle has no regional  wall motion abnormalities. There is mild concentric left ventricular  hypertrophy. Left ventricular diastolic  parameters are indeterminate.   2. Right ventricular systolic function is low normal. The right  ventricular size is normal.   3. The mitral valve is grossly normal. Trivial mitral valve  regurgitation. No evidence of mitral stenosis.   4. The aortic valve has an indeterminant number of cusps. Aortic valve  regurgitation is not visualized. No aortic stenosis is present.   5. The inferior vena cava is normal in size with greater than 50%  respiratory variability, suggesting right atrial pressure of 3 mmHg.   Comparison(s): No significant change from prior study.   Conclusion(s)/Recommendation(s): Normal  biventricular function without  evidence of hemodynamically significant valvular heart disease.   Echocardiogram 10/06/2021  IMPRESSIONS     1. Left ventricular ejection fraction, by estimation, is 55 to 60%. Left  ventricular ejection fraction by 3D volume is 58 %. The left ventricle has  normal function. The left ventricle has no regional wall motion  abnormalities. Left ventricular diastolic   parameters are consistent with Grade II diastolic dysfunction  (pseudonormalization).   2. Right ventricular systolic function is normal. The right ventricular  size is mildly enlarged.   3. Left atrial  size was mildly dilated.   4. Right atrial size was mildly dilated.   5. The mitral valve is normal in structure. No evidence of mitral valve  regurgitation. No evidence of mitral stenosis.   6. The aortic valve is normal in structure. There is mild calcification  of the aortic valve. There is mild thickening of the aortic valve. Aortic  valve regurgitation is trivial. Aortic valve sclerosis is present, with no  evidence of aortic valve  stenosis.   7. The inferior vena cava is normal in size with greater than 50%  respiratory variability, suggesting right atrial pressure of 3 mmHg.   Cardiac catheterization 09/02/2020    Prox RCA lesion is 80% stenosed.   A drug-eluting stent was successfully placed using a SYNERGY [id number] 3.0X24.   Post intervention, there is a 0% residual stenosis.   Successful PCI of the proximal RCA with IVUS guidance, orbital atherectomy, Shockwave lithotripsy and DES x 1   Plan: DAPT for one year. If stable anticipate DC in am.  Carotid Doppler 11/07/2021 Summary:  Right Carotid: Velocities in the right ICA are consistent with a 1-39%  stenosis.   Left Carotid: Velocities in the left ICA are consistent with a 1-39%  stenosis.   Vertebrals: Bilateral vertebral arteries demonstrate antegrade flow.  Subclavians: Normal flow hemodynamics were seen in bilateral  subclavian               arteries.   Assessment & Plan   1.  Coronary artery disease-denies recent episodes of chest discomfort.  Denies exertional chest pain.  She is status post CABG x3 by Dr. Obadiah in 2013.  She underwent subsequent cardiac catheterization 09/02/2020 and underwent orbital arthrectomy by Dr. Jordan.  A DES was placed in her RCA. Continue aspirin , amlodipine , Plavix , metoprolol , nitroglycerin  Heart healthy low-sodium diet Maintain/increase physical activity as tolerated  Hyperlipidemia-LDL 28 on 05/03/23 Continue aspirin , Repatha , atorvastatin  High-fiber diet Follows with PCP  Essential hypertension-BP today 112/76 Continue amlodipine , aspirin , atorvastatin , metoprolol , Repatha  Heart healthy low-sodium diet-reviewed  DOE, SOB, dizziness-stable.  Her echocardiogram 10/06/2021 was reassuring. Details above.  Carotid Doppler also reassuring. Maintain p.o. hydration/increase as tolerated-reviewed Change positions slowly Continue lower extremity support stockings   Disposition: Follow-up with Dr. Court or me in 6 months.   Josefa HERO. Reagan Klemz NP-C     12/11/2023, 1:39 PM [facility] [address] Office [phone] Fax [phone]  Notice: This dictation was prepared with Dragon dictation along with smaller phrase technology. Any transcriptional errors that result from this process are unintentional and may not be corrected upon review.  I spent 13 minutes examining this patient, reviewing medications, and using patient centered shared decision making involving her cardiac care.  Prior to her visit I spent greater than 20 minutes reviewing her past medical history,  medications, and prior cardiac tests.

## 2023-12-11 ENCOUNTER — Encounter: Payer: Self-pay | Admitting: General Practice

## 2023-12-11 ENCOUNTER — Ambulatory Visit: Attending: General Practice | Admitting: General Practice

## 2023-12-11 VITALS — BP 112/76 | HR 73 | Ht 64.0 in | Wt 186.2 lb

## 2023-12-11 DIAGNOSIS — E785 Hyperlipidemia, unspecified: Secondary | ICD-10-CM

## 2023-12-11 DIAGNOSIS — R42 Dizziness and giddiness: Secondary | ICD-10-CM | POA: Diagnosis not present

## 2023-12-11 DIAGNOSIS — I10 Essential (primary) hypertension: Secondary | ICD-10-CM

## 2023-12-11 DIAGNOSIS — I2510 Atherosclerotic heart disease of native coronary artery without angina pectoris: Secondary | ICD-10-CM | POA: Diagnosis not present

## 2023-12-11 MED ORDER — NITROGLYCERIN 0.4 MG SL SUBL: 0.4000 mg | SUBLINGUAL_TABLET | SUBLINGUAL | 2 refills | Status: AC | PRN

## 2023-12-11 MED ORDER — REPATHA SURECLICK 140 MG/ML ~~LOC~~ SOAJ: 140.0000 mg | SUBCUTANEOUS | 11 refills | Status: AC

## 2023-12-11 NOTE — Patient Instructions (Signed)
 Medication Instructions:  Your physician recommends that you continue on your current medications as directed. Please refer to the Current Medication list given to you today.  *If you need a refill on your cardiac medications before your next appointment, please call your pharmacy*  Lab Work: None  If you have labs (blood work) drawn today and your tests are completely normal, you will receive your results only by: MyChart Message (if you have MyChart) OR A paper copy in the mail If you have any lab test that is abnormal or we need to change your treatment, we will call you to review the results.  Testing/Procedures: None   Follow-Up: At [facility], you and your health needs are our priority.  As part of our continuing mission to provide you with exceptional heart care, our providers are all part of one team.  This team includes your primary Cardiologist (physician) and Advanced Practice Providers or APPs (Physician Assistants and Nurse Practitioners) who all work together to provide you with the care you need, when you need it.  Your next appointment:   6 - 9 months  Provider:   Josefa Beauvais, NP       We recommend signing up for the patient portal called MyChart.  Sign up information is provided on this After Visit Summary.  MyChart is used to connect with patients for Virtual Visits (Telemedicine).  Patients are able to view lab/test results, encounter notes, upcoming appointments, etc.  Non-urgent messages can be sent to your provider as well.   To learn more about what you can do with MyChart, go to [email or web address].   Other Instructions None

## 2024-01-22 ENCOUNTER — Other Ambulatory Visit
